# Patient Record
Sex: Female | Born: 1935 | Race: White | Hispanic: No | Marital: Married | Smoking: Former smoker
Health system: Southern US, Community
[De-identification: ages and names within clinical notes are randomized; demographics above are authoritative.]

## PROBLEM LIST (undated history)

## (undated) DIAGNOSIS — J438 Other emphysema: Secondary | ICD-10-CM

## (undated) DIAGNOSIS — T8859XA Other complications of anesthesia, initial encounter: Secondary | ICD-10-CM

## (undated) DIAGNOSIS — H547 Unspecified visual loss: Secondary | ICD-10-CM

## (undated) DIAGNOSIS — Z7901 Long term (current) use of anticoagulants: Secondary | ICD-10-CM

## (undated) DIAGNOSIS — E785 Hyperlipidemia, unspecified: Secondary | ICD-10-CM

## (undated) DIAGNOSIS — I4891 Unspecified atrial fibrillation: Secondary | ICD-10-CM

## (undated) DIAGNOSIS — E039 Hypothyroidism, unspecified: Secondary | ICD-10-CM

## (undated) DIAGNOSIS — F329 Major depressive disorder, single episode, unspecified: Secondary | ICD-10-CM

## (undated) DIAGNOSIS — I6529 Occlusion and stenosis of unspecified carotid artery: Secondary | ICD-10-CM

## (undated) DIAGNOSIS — Z860100 Personal history of colon polyps, unspecified: Secondary | ICD-10-CM

## (undated) DIAGNOSIS — I1310 Hypertensive heart and chronic kidney disease without heart failure, with stage 1 through stage 4 chronic kidney disease, or unspecified chronic kidney disease: Secondary | ICD-10-CM

## (undated) DIAGNOSIS — T4145XA Adverse effect of unspecified anesthetic, initial encounter: Secondary | ICD-10-CM

## (undated) DIAGNOSIS — M48061 Spinal stenosis, lumbar region without neurogenic claudication: Secondary | ICD-10-CM

## (undated) DIAGNOSIS — N3090 Cystitis, unspecified without hematuria: Secondary | ICD-10-CM

## (undated) DIAGNOSIS — K219 Gastro-esophageal reflux disease without esophagitis: Secondary | ICD-10-CM

## (undated) DIAGNOSIS — C4442 Squamous cell carcinoma of skin of scalp and neck: Secondary | ICD-10-CM

## (undated) DIAGNOSIS — I350 Nonrheumatic aortic (valve) stenosis: Secondary | ICD-10-CM

## (undated) DIAGNOSIS — S22080A Wedge compression fracture of T11-T12 vertebra, initial encounter for closed fracture: Secondary | ICD-10-CM

## (undated) DIAGNOSIS — T148XXA Other injury of unspecified body region, initial encounter: Secondary | ICD-10-CM

## (undated) DIAGNOSIS — I10 Essential (primary) hypertension: Secondary | ICD-10-CM

## (undated) DIAGNOSIS — I639 Cerebral infarction, unspecified: Secondary | ICD-10-CM

## (undated) DIAGNOSIS — I5031 Acute diastolic (congestive) heart failure: Secondary | ICD-10-CM

## (undated) HISTORY — DX: Squamous cell carcinoma of skin of scalp and neck: C44.42

## (undated) HISTORY — DX: Cerebral infarction, unspecified: I63.9

## (undated) HISTORY — DX: Personal history of colonic polyps: Z86.010

## (undated) HISTORY — DX: Spinal stenosis, lumbar region without neurogenic claudication: M48.061

## (undated) HISTORY — PX: TONSILLECTOMY AND ADENOIDECTOMY: SUR1326

## (undated) HISTORY — DX: Hypertensive heart and chronic kidney disease without heart failure, with stage 1 through stage 4 chronic kidney disease, or unspecified chronic kidney disease: I13.10

## (undated) HISTORY — DX: Essential (primary) hypertension: I10

## (undated) HISTORY — DX: Wedge compression fracture of t11-T12 vertebra, initial encounter for closed fracture: S22.080A

## (undated) HISTORY — DX: Major depressive disorder, single episode, unspecified: F32.9

## (undated) HISTORY — DX: Nonrheumatic aortic (valve) stenosis: I35.0

## (undated) HISTORY — DX: Other emphysema: J43.8

## (undated) HISTORY — DX: Other injury of unspecified body region, initial encounter: T14.8XXA

## (undated) HISTORY — DX: Acute diastolic (congestive) heart failure: I50.31

## (undated) HISTORY — DX: Personal history of colon polyps, unspecified: Z86.0100

## (undated) HISTORY — DX: Occlusion and stenosis of unspecified carotid artery: I65.29

## (undated) HISTORY — DX: Long term (current) use of anticoagulants: Z79.01

## (undated) HISTORY — DX: Unspecified atrial fibrillation: I48.91

## (undated) HISTORY — PX: APPENDECTOMY: SHX54

## (undated) HISTORY — DX: Gastro-esophageal reflux disease without esophagitis: K21.9

## (undated) HISTORY — DX: Unspecified visual loss: H54.7

## (undated) HISTORY — PX: CATARACT EXTRACTION: SUR2

## (undated) HISTORY — DX: Hyperlipidemia, unspecified: E78.5

---

## 1995-12-09 ENCOUNTER — Encounter: Payer: Self-pay | Admitting: Gastroenterology

## 1998-04-12 ENCOUNTER — Other Ambulatory Visit: Admission: RE | Admit: 1998-04-12 | Discharge: 1998-04-12 | Payer: Self-pay | Admitting: *Deleted

## 1999-05-04 ENCOUNTER — Other Ambulatory Visit: Admission: RE | Admit: 1999-05-04 | Discharge: 1999-05-04 | Payer: Self-pay | Admitting: *Deleted

## 2000-09-18 ENCOUNTER — Other Ambulatory Visit: Admission: RE | Admit: 2000-09-18 | Discharge: 2000-09-18 | Payer: Self-pay | Admitting: *Deleted

## 2000-12-23 HISTORY — PX: OTHER SURGICAL HISTORY: SHX169

## 2001-04-21 ENCOUNTER — Ambulatory Visit (HOSPITAL_COMMUNITY): Admission: RE | Admit: 2001-04-21 | Discharge: 2001-04-21 | Payer: Self-pay | Admitting: Internal Medicine

## 2001-04-21 ENCOUNTER — Encounter: Payer: Self-pay | Admitting: Internal Medicine

## 2001-04-23 ENCOUNTER — Ambulatory Visit (HOSPITAL_COMMUNITY): Admission: RE | Admit: 2001-04-23 | Discharge: 2001-04-23 | Payer: Self-pay | Admitting: Internal Medicine

## 2001-04-23 ENCOUNTER — Encounter: Payer: Self-pay | Admitting: Internal Medicine

## 2001-05-12 ENCOUNTER — Ambulatory Visit (HOSPITAL_COMMUNITY): Admission: RE | Admit: 2001-05-12 | Discharge: 2001-05-12 | Payer: Self-pay

## 2001-05-12 ENCOUNTER — Encounter (INDEPENDENT_AMBULATORY_CARE_PROVIDER_SITE_OTHER): Payer: Self-pay | Admitting: *Deleted

## 2001-09-23 ENCOUNTER — Other Ambulatory Visit: Admission: RE | Admit: 2001-09-23 | Discharge: 2001-09-23 | Payer: Self-pay | Admitting: *Deleted

## 2002-03-04 ENCOUNTER — Ambulatory Visit (HOSPITAL_COMMUNITY): Admission: RE | Admit: 2002-03-04 | Discharge: 2002-03-04 | Payer: Self-pay | Admitting: Specialist

## 2002-03-22 ENCOUNTER — Ambulatory Visit (HOSPITAL_COMMUNITY): Admission: RE | Admit: 2002-03-22 | Discharge: 2002-03-22 | Payer: Self-pay | Admitting: Specialist

## 2002-04-21 ENCOUNTER — Ambulatory Visit (HOSPITAL_COMMUNITY): Admission: RE | Admit: 2002-04-21 | Discharge: 2002-04-21 | Payer: Self-pay | Admitting: Specialist

## 2002-09-28 ENCOUNTER — Other Ambulatory Visit: Admission: RE | Admit: 2002-09-28 | Discharge: 2002-09-28 | Payer: Self-pay | Admitting: Obstetrics and Gynecology

## 2003-09-21 ENCOUNTER — Encounter: Payer: Self-pay | Admitting: Internal Medicine

## 2003-09-21 ENCOUNTER — Encounter: Admission: RE | Admit: 2003-09-21 | Discharge: 2003-09-21 | Payer: Self-pay | Admitting: Internal Medicine

## 2003-12-24 HISTORY — PX: COLONOSCOPY W/ POLYPECTOMY: SHX1380

## 2004-10-22 ENCOUNTER — Encounter: Payer: Self-pay | Admitting: Gastroenterology

## 2004-10-22 LAB — HM COLONOSCOPY

## 2005-05-23 ENCOUNTER — Ambulatory Visit: Payer: Self-pay | Admitting: Internal Medicine

## 2005-05-28 ENCOUNTER — Ambulatory Visit: Payer: Self-pay | Admitting: Internal Medicine

## 2005-07-15 ENCOUNTER — Ambulatory Visit: Payer: Self-pay | Admitting: Internal Medicine

## 2005-09-05 ENCOUNTER — Ambulatory Visit: Payer: Self-pay | Admitting: Internal Medicine

## 2005-09-10 ENCOUNTER — Ambulatory Visit: Payer: Self-pay | Admitting: Internal Medicine

## 2005-10-01 ENCOUNTER — Ambulatory Visit: Payer: Self-pay | Admitting: Internal Medicine

## 2005-10-02 ENCOUNTER — Ambulatory Visit: Payer: Self-pay | Admitting: Internal Medicine

## 2005-10-28 ENCOUNTER — Ambulatory Visit: Payer: Self-pay | Admitting: Internal Medicine

## 2005-10-30 ENCOUNTER — Ambulatory Visit: Payer: Self-pay | Admitting: Internal Medicine

## 2006-01-02 ENCOUNTER — Ambulatory Visit (HOSPITAL_COMMUNITY): Admission: RE | Admit: 2006-01-02 | Discharge: 2006-01-02 | Payer: Self-pay | Admitting: Chiropractic Medicine

## 2006-03-12 ENCOUNTER — Encounter: Admission: RE | Admit: 2006-03-12 | Discharge: 2006-03-12 | Payer: Self-pay | Admitting: Orthopaedic Surgery

## 2006-05-26 ENCOUNTER — Ambulatory Visit: Payer: Self-pay | Admitting: Internal Medicine

## 2006-05-27 ENCOUNTER — Ambulatory Visit: Payer: Self-pay | Admitting: Internal Medicine

## 2006-06-09 ENCOUNTER — Ambulatory Visit: Payer: Self-pay | Admitting: Internal Medicine

## 2006-06-17 ENCOUNTER — Ambulatory Visit: Payer: Self-pay | Admitting: Internal Medicine

## 2006-09-12 ENCOUNTER — Ambulatory Visit: Payer: Self-pay | Admitting: Internal Medicine

## 2006-09-17 ENCOUNTER — Encounter: Admission: RE | Admit: 2006-09-17 | Discharge: 2006-09-17 | Payer: Self-pay | Admitting: Internal Medicine

## 2006-09-30 ENCOUNTER — Ambulatory Visit: Payer: Self-pay | Admitting: Cardiology

## 2006-10-09 ENCOUNTER — Ambulatory Visit: Payer: Self-pay

## 2006-10-09 ENCOUNTER — Encounter: Payer: Self-pay | Admitting: Cardiology

## 2006-11-04 ENCOUNTER — Emergency Department (HOSPITAL_COMMUNITY): Admission: EM | Admit: 2006-11-04 | Discharge: 2006-11-04 | Payer: Self-pay | Admitting: Emergency Medicine

## 2006-11-06 ENCOUNTER — Ambulatory Visit: Payer: Self-pay | Admitting: Internal Medicine

## 2006-11-25 ENCOUNTER — Ambulatory Visit: Payer: Self-pay | Admitting: Internal Medicine

## 2006-12-23 HISTORY — PX: TOTAL HIP ARTHROPLASTY: SHX124

## 2007-01-06 ENCOUNTER — Ambulatory Visit: Payer: Self-pay | Admitting: Cardiovascular Disease

## 2007-01-06 ENCOUNTER — Inpatient Hospital Stay (HOSPITAL_COMMUNITY): Admission: RE | Admit: 2007-01-06 | Discharge: 2007-01-15 | Payer: Self-pay | Admitting: Orthopaedic Surgery

## 2007-01-08 ENCOUNTER — Encounter: Payer: Self-pay | Admitting: Cardiology

## 2007-01-20 ENCOUNTER — Ambulatory Visit: Payer: Self-pay | Admitting: Cardiology

## 2007-01-27 ENCOUNTER — Ambulatory Visit: Payer: Self-pay

## 2007-01-28 ENCOUNTER — Ambulatory Visit: Payer: Self-pay | Admitting: Internal Medicine

## 2007-02-17 ENCOUNTER — Ambulatory Visit: Payer: Self-pay | Admitting: Cardiology

## 2007-04-10 DIAGNOSIS — Z860100 Personal history of colon polyps, unspecified: Secondary | ICD-10-CM | POA: Insufficient documentation

## 2007-04-10 DIAGNOSIS — E785 Hyperlipidemia, unspecified: Secondary | ICD-10-CM | POA: Insufficient documentation

## 2007-04-10 DIAGNOSIS — E041 Nontoxic single thyroid nodule: Secondary | ICD-10-CM | POA: Insufficient documentation

## 2007-05-29 ENCOUNTER — Ambulatory Visit: Payer: Self-pay | Admitting: Cardiology

## 2007-06-02 ENCOUNTER — Ambulatory Visit: Payer: Self-pay | Admitting: Internal Medicine

## 2007-06-02 DIAGNOSIS — K219 Gastro-esophageal reflux disease without esophagitis: Secondary | ICD-10-CM

## 2007-06-02 HISTORY — DX: Gastro-esophageal reflux disease without esophagitis: K21.9

## 2007-06-04 ENCOUNTER — Encounter: Payer: Self-pay | Admitting: Internal Medicine

## 2007-06-04 LAB — CONVERTED CEMR LAB
ALT: 25 units/L (ref 0–40)
AST: 28 units/L (ref 0–37)
Creatinine, Ser: 0.9 mg/dL (ref 0.4–1.2)
Glucose, Bld: 105 mg/dL — ABNORMAL HIGH (ref 70–99)
VLDL: 19 mg/dL (ref 0–40)

## 2007-06-11 ENCOUNTER — Telehealth: Payer: Self-pay | Admitting: Internal Medicine

## 2007-07-06 ENCOUNTER — Encounter: Payer: Self-pay | Admitting: Internal Medicine

## 2007-07-07 ENCOUNTER — Ambulatory Visit: Payer: Self-pay | Admitting: Gastroenterology

## 2007-07-23 ENCOUNTER — Telehealth (INDEPENDENT_AMBULATORY_CARE_PROVIDER_SITE_OTHER): Payer: Self-pay | Admitting: *Deleted

## 2007-07-24 ENCOUNTER — Ambulatory Visit: Payer: Self-pay | Admitting: Internal Medicine

## 2007-07-24 DIAGNOSIS — R42 Dizziness and giddiness: Secondary | ICD-10-CM

## 2007-07-30 ENCOUNTER — Ambulatory Visit: Payer: Self-pay | Admitting: Internal Medicine

## 2007-08-05 ENCOUNTER — Encounter (INDEPENDENT_AMBULATORY_CARE_PROVIDER_SITE_OTHER): Payer: Self-pay | Admitting: *Deleted

## 2007-08-06 ENCOUNTER — Ambulatory Visit: Payer: Self-pay | Admitting: Internal Medicine

## 2007-08-06 DIAGNOSIS — E039 Hypothyroidism, unspecified: Secondary | ICD-10-CM

## 2007-08-17 ENCOUNTER — Encounter: Payer: Self-pay | Admitting: Internal Medicine

## 2007-08-31 ENCOUNTER — Ambulatory Visit: Payer: Self-pay | Admitting: Internal Medicine

## 2007-09-01 ENCOUNTER — Encounter: Payer: Self-pay | Admitting: Internal Medicine

## 2007-09-01 LAB — CONVERTED CEMR LAB
C3 Complement: 91 mg/dL (ref 88–201)
Compl, Total (CH50): 60 (ref 31–66)

## 2007-09-02 LAB — CONVERTED CEMR LAB
BUN: 9 mg/dL (ref 6–23)
Calcium: 9.5 mg/dL (ref 8.4–10.5)
Chloride: 105 meq/L (ref 96–112)
GFR calc non Af Amer: 64 mL/min
Hgb A1c MFr Bld: 6.1 % — ABNORMAL HIGH (ref 4.6–6.0)
Homocysteine: 8.9 micromoles/L (ref 5.00–13.90)
LDL Cholesterol: 91 mg/dL (ref 0–99)
Sodium: 144 meq/L (ref 135–145)
VLDL: 15 mg/dL (ref 0–40)

## 2007-10-06 ENCOUNTER — Ambulatory Visit: Payer: Self-pay | Admitting: Internal Medicine

## 2007-10-09 ENCOUNTER — Encounter (INDEPENDENT_AMBULATORY_CARE_PROVIDER_SITE_OTHER): Payer: Self-pay | Admitting: *Deleted

## 2007-10-13 ENCOUNTER — Ambulatory Visit: Payer: Self-pay | Admitting: Internal Medicine

## 2007-10-15 ENCOUNTER — Encounter: Payer: Self-pay | Admitting: Internal Medicine

## 2007-10-22 ENCOUNTER — Encounter: Payer: Self-pay | Admitting: Internal Medicine

## 2007-10-26 ENCOUNTER — Encounter: Payer: Self-pay | Admitting: Internal Medicine

## 2007-10-26 ENCOUNTER — Ambulatory Visit: Payer: Self-pay | Admitting: Family Medicine

## 2007-11-06 ENCOUNTER — Encounter (INDEPENDENT_AMBULATORY_CARE_PROVIDER_SITE_OTHER): Payer: Self-pay | Admitting: *Deleted

## 2007-12-01 ENCOUNTER — Ambulatory Visit: Payer: Self-pay | Admitting: Internal Medicine

## 2007-12-01 DIAGNOSIS — M810 Age-related osteoporosis without current pathological fracture: Secondary | ICD-10-CM | POA: Insufficient documentation

## 2007-12-01 DIAGNOSIS — I10 Essential (primary) hypertension: Secondary | ICD-10-CM

## 2007-12-01 HISTORY — DX: Essential (primary) hypertension: I10

## 2007-12-24 DIAGNOSIS — I639 Cerebral infarction, unspecified: Secondary | ICD-10-CM

## 2007-12-24 HISTORY — DX: Cerebral infarction, unspecified: I63.9

## 2008-01-07 ENCOUNTER — Encounter: Payer: Self-pay | Admitting: Internal Medicine

## 2008-01-07 ENCOUNTER — Inpatient Hospital Stay (HOSPITAL_COMMUNITY): Admission: EM | Admit: 2008-01-07 | Discharge: 2008-01-14 | Payer: Self-pay | Admitting: Emergency Medicine

## 2008-01-07 ENCOUNTER — Ambulatory Visit: Payer: Self-pay | Admitting: Cardiology

## 2008-01-07 ENCOUNTER — Ambulatory Visit: Payer: Self-pay | Admitting: Emergency Medicine

## 2008-01-08 ENCOUNTER — Encounter (INDEPENDENT_AMBULATORY_CARE_PROVIDER_SITE_OTHER): Payer: Self-pay | Admitting: Interventional Radiology

## 2008-01-11 ENCOUNTER — Encounter (INDEPENDENT_AMBULATORY_CARE_PROVIDER_SITE_OTHER): Payer: Self-pay | Admitting: Interventional Radiology

## 2008-01-11 ENCOUNTER — Ambulatory Visit: Payer: Self-pay | Admitting: Physical Medicine & Rehabilitation

## 2008-01-14 ENCOUNTER — Ambulatory Visit: Payer: Self-pay | Admitting: Cardiology

## 2008-01-14 ENCOUNTER — Encounter: Payer: Self-pay | Admitting: Internal Medicine

## 2008-01-14 ENCOUNTER — Inpatient Hospital Stay (HOSPITAL_COMMUNITY)
Admission: RE | Admit: 2008-01-14 | Discharge: 2008-01-21 | Payer: Self-pay | Admitting: Physical Medicine & Rehabilitation

## 2008-01-25 ENCOUNTER — Encounter
Admission: RE | Admit: 2008-01-25 | Discharge: 2008-04-14 | Payer: Self-pay | Admitting: Physical Medicine & Rehabilitation

## 2008-01-25 ENCOUNTER — Ambulatory Visit: Payer: Self-pay | Admitting: Internal Medicine

## 2008-01-25 LAB — CONVERTED CEMR LAB: Vit D, 1,25-Dihydroxy: 28 — ABNORMAL LOW (ref 30–89)

## 2008-02-01 ENCOUNTER — Ambulatory Visit: Payer: Self-pay | Admitting: Internal Medicine

## 2008-02-01 DIAGNOSIS — I6350 Cerebral infarction due to unspecified occlusion or stenosis of unspecified cerebral artery: Secondary | ICD-10-CM | POA: Insufficient documentation

## 2008-02-01 DIAGNOSIS — E559 Vitamin D deficiency, unspecified: Secondary | ICD-10-CM | POA: Insufficient documentation

## 2008-02-01 DIAGNOSIS — I4891 Unspecified atrial fibrillation: Secondary | ICD-10-CM | POA: Insufficient documentation

## 2008-02-02 ENCOUNTER — Ambulatory Visit: Payer: Self-pay | Admitting: Cardiology

## 2008-02-08 ENCOUNTER — Ambulatory Visit: Payer: Self-pay | Admitting: Cardiovascular Disease

## 2008-02-18 ENCOUNTER — Encounter
Admission: RE | Admit: 2008-02-18 | Discharge: 2008-02-19 | Payer: Self-pay | Admitting: Physical Medicine & Rehabilitation

## 2008-02-18 ENCOUNTER — Ambulatory Visit: Payer: Self-pay | Admitting: Physical Medicine & Rehabilitation

## 2008-02-18 ENCOUNTER — Ambulatory Visit: Payer: Self-pay | Admitting: Cardiology

## 2008-03-01 ENCOUNTER — Ambulatory Visit (HOSPITAL_COMMUNITY)
Admission: RE | Admit: 2008-03-01 | Discharge: 2008-03-01 | Payer: Self-pay | Admitting: Physical Medicine & Rehabilitation

## 2008-03-01 ENCOUNTER — Encounter: Payer: Self-pay | Admitting: Internal Medicine

## 2008-03-17 ENCOUNTER — Ambulatory Visit: Payer: Self-pay | Admitting: Internal Medicine

## 2008-03-31 ENCOUNTER — Telehealth (INDEPENDENT_AMBULATORY_CARE_PROVIDER_SITE_OTHER): Payer: Self-pay | Admitting: *Deleted

## 2008-04-04 ENCOUNTER — Ambulatory Visit: Payer: Self-pay | Admitting: Cardiology

## 2008-04-08 DIAGNOSIS — K469 Unspecified abdominal hernia without obstruction or gangrene: Secondary | ICD-10-CM | POA: Insufficient documentation

## 2008-04-08 DIAGNOSIS — J438 Other emphysema: Secondary | ICD-10-CM

## 2008-04-08 DIAGNOSIS — M129 Arthropathy, unspecified: Secondary | ICD-10-CM | POA: Insufficient documentation

## 2008-04-08 HISTORY — DX: Other emphysema: J43.8

## 2008-04-15 ENCOUNTER — Ambulatory Visit: Payer: Self-pay | Admitting: Internal Medicine

## 2008-04-15 ENCOUNTER — Ambulatory Visit: Payer: Self-pay | Admitting: Cardiology

## 2008-05-05 ENCOUNTER — Ambulatory Visit: Payer: Self-pay | Admitting: Internal Medicine

## 2008-05-12 ENCOUNTER — Encounter
Admission: RE | Admit: 2008-05-12 | Discharge: 2008-05-17 | Payer: Self-pay | Admitting: Physical Medicine & Rehabilitation

## 2008-05-17 ENCOUNTER — Ambulatory Visit: Payer: Self-pay | Admitting: Physical Medicine & Rehabilitation

## 2008-06-03 ENCOUNTER — Ambulatory Visit: Payer: Self-pay | Admitting: Cardiology

## 2008-06-13 ENCOUNTER — Ambulatory Visit: Payer: Self-pay | Admitting: Internal Medicine

## 2008-06-27 ENCOUNTER — Ambulatory Visit: Payer: Self-pay | Admitting: Internal Medicine

## 2008-07-06 ENCOUNTER — Telehealth (INDEPENDENT_AMBULATORY_CARE_PROVIDER_SITE_OTHER): Payer: Self-pay | Admitting: *Deleted

## 2008-07-14 ENCOUNTER — Ambulatory Visit: Payer: Self-pay | Admitting: Internal Medicine

## 2008-07-21 ENCOUNTER — Ambulatory Visit: Payer: Self-pay | Admitting: Cardiology

## 2008-07-22 ENCOUNTER — Encounter: Payer: Self-pay | Admitting: Internal Medicine

## 2008-07-28 ENCOUNTER — Ambulatory Visit: Payer: Self-pay | Admitting: Internal Medicine

## 2008-07-28 DIAGNOSIS — R5381 Other malaise: Secondary | ICD-10-CM

## 2008-07-28 DIAGNOSIS — R5383 Other fatigue: Secondary | ICD-10-CM

## 2008-07-28 DIAGNOSIS — Z8679 Personal history of other diseases of the circulatory system: Secondary | ICD-10-CM | POA: Insufficient documentation

## 2008-08-02 ENCOUNTER — Telehealth (INDEPENDENT_AMBULATORY_CARE_PROVIDER_SITE_OTHER): Payer: Self-pay | Admitting: *Deleted

## 2008-08-02 ENCOUNTER — Ambulatory Visit: Payer: Self-pay | Admitting: Internal Medicine

## 2008-08-03 ENCOUNTER — Encounter (INDEPENDENT_AMBULATORY_CARE_PROVIDER_SITE_OTHER): Payer: Self-pay | Admitting: *Deleted

## 2008-08-03 ENCOUNTER — Ambulatory Visit: Payer: Self-pay | Admitting: Internal Medicine

## 2008-08-03 LAB — CONVERTED CEMR LAB: OCCULT 3: NEGATIVE

## 2008-08-04 ENCOUNTER — Ambulatory Visit: Payer: Self-pay | Admitting: Internal Medicine

## 2008-08-07 LAB — CONVERTED CEMR LAB
AST: 28 units/L (ref 0–37)
Alkaline Phosphatase: 37 units/L — ABNORMAL LOW (ref 39–117)
BUN: 11 mg/dL (ref 6–23)
Bilirubin, Direct: 0.1 mg/dL (ref 0.0–0.3)
Chloride: 103 meq/L (ref 96–112)
Cholesterol: 157 mg/dL (ref 0–200)
GFR calc non Af Amer: 74 mL/min
LDL Cholesterol: 80 mg/dL (ref 0–99)
Potassium: 4.3 meq/L (ref 3.5–5.1)
Total Bilirubin: 1.2 mg/dL (ref 0.3–1.2)

## 2008-08-08 ENCOUNTER — Encounter (INDEPENDENT_AMBULATORY_CARE_PROVIDER_SITE_OTHER): Payer: Self-pay | Admitting: *Deleted

## 2008-08-26 ENCOUNTER — Encounter: Payer: Self-pay | Admitting: Internal Medicine

## 2008-08-30 ENCOUNTER — Telehealth: Payer: Self-pay | Admitting: Internal Medicine

## 2008-09-01 ENCOUNTER — Encounter: Payer: Self-pay | Admitting: Internal Medicine

## 2008-09-02 ENCOUNTER — Ambulatory Visit: Payer: Self-pay | Admitting: Internal Medicine

## 2008-09-12 ENCOUNTER — Ambulatory Visit: Payer: Self-pay | Admitting: Cardiovascular Disease

## 2008-09-12 ENCOUNTER — Emergency Department (HOSPITAL_COMMUNITY): Admission: EM | Admit: 2008-09-12 | Discharge: 2008-09-12 | Payer: Self-pay | Admitting: Emergency Medicine

## 2008-09-12 LAB — CONVERTED CEMR LAB
INR: 5.6 (ref 0.8–1.0)
Prothrombin Time: 54.8 s (ref 10.9–13.3)

## 2008-09-14 ENCOUNTER — Ambulatory Visit: Payer: Self-pay | Admitting: Cardiology

## 2008-09-21 ENCOUNTER — Encounter: Payer: Self-pay | Admitting: Internal Medicine

## 2008-09-23 ENCOUNTER — Ambulatory Visit: Payer: Self-pay | Admitting: Cardiovascular Disease

## 2008-10-04 ENCOUNTER — Encounter: Payer: Self-pay | Admitting: Internal Medicine

## 2008-10-12 ENCOUNTER — Ambulatory Visit: Payer: Self-pay | Admitting: Cardiology

## 2008-10-14 ENCOUNTER — Telehealth (INDEPENDENT_AMBULATORY_CARE_PROVIDER_SITE_OTHER): Payer: Self-pay | Admitting: *Deleted

## 2008-10-20 ENCOUNTER — Ambulatory Visit: Payer: Self-pay | Admitting: Cardiology

## 2008-10-26 ENCOUNTER — Ambulatory Visit: Payer: Self-pay | Admitting: Cardiovascular Disease

## 2008-11-14 ENCOUNTER — Ambulatory Visit: Payer: Self-pay | Admitting: Internal Medicine

## 2008-11-21 ENCOUNTER — Encounter: Payer: Self-pay | Admitting: Internal Medicine

## 2008-11-22 ENCOUNTER — Ambulatory Visit: Payer: Self-pay | Admitting: Internal Medicine

## 2008-11-22 DIAGNOSIS — H9190 Unspecified hearing loss, unspecified ear: Secondary | ICD-10-CM | POA: Insufficient documentation

## 2008-12-05 ENCOUNTER — Ambulatory Visit: Payer: Self-pay | Admitting: Cardiology

## 2008-12-23 HISTORY — PX: TOTAL SHOULDER REPLACEMENT: SUR1217

## 2009-01-02 ENCOUNTER — Ambulatory Visit: Payer: Self-pay | Admitting: Cardiology

## 2009-01-16 ENCOUNTER — Ambulatory Visit: Payer: Self-pay | Admitting: Cardiology

## 2009-02-06 ENCOUNTER — Ambulatory Visit: Payer: Self-pay | Admitting: Internal Medicine

## 2009-03-06 ENCOUNTER — Ambulatory Visit: Payer: Self-pay | Admitting: Cardiology

## 2009-04-03 ENCOUNTER — Ambulatory Visit: Payer: Self-pay | Admitting: Internal Medicine

## 2009-05-02 ENCOUNTER — Ambulatory Visit: Payer: Self-pay | Admitting: Cardiology

## 2009-05-24 ENCOUNTER — Encounter: Payer: Self-pay | Admitting: *Deleted

## 2009-05-29 ENCOUNTER — Encounter (INDEPENDENT_AMBULATORY_CARE_PROVIDER_SITE_OTHER): Payer: Self-pay | Admitting: Cardiology

## 2009-05-29 ENCOUNTER — Ambulatory Visit: Payer: Self-pay | Admitting: Cardiovascular Disease

## 2009-05-29 LAB — CONVERTED CEMR LAB
POC INR: 3.5
Protime: 22.6

## 2009-06-19 ENCOUNTER — Ambulatory Visit: Payer: Self-pay | Admitting: Cardiology

## 2009-06-20 ENCOUNTER — Ambulatory Visit: Payer: Self-pay | Admitting: Internal Medicine

## 2009-06-22 ENCOUNTER — Telehealth (INDEPENDENT_AMBULATORY_CARE_PROVIDER_SITE_OTHER): Payer: Self-pay | Admitting: *Deleted

## 2009-06-22 ENCOUNTER — Ambulatory Visit (HOSPITAL_COMMUNITY): Admission: RE | Admit: 2009-06-22 | Discharge: 2009-06-22 | Payer: Self-pay | Admitting: Internal Medicine

## 2009-06-23 ENCOUNTER — Telehealth (INDEPENDENT_AMBULATORY_CARE_PROVIDER_SITE_OTHER): Payer: Self-pay | Admitting: *Deleted

## 2009-06-27 ENCOUNTER — Encounter (INDEPENDENT_AMBULATORY_CARE_PROVIDER_SITE_OTHER): Payer: Self-pay | Admitting: *Deleted

## 2009-06-27 LAB — CONVERTED CEMR LAB
BUN: 14 mg/dL (ref 6–23)
Basophils Absolute: 0.3 10*3/uL — ABNORMAL HIGH (ref 0.0–0.1)
Creatinine, Ser: 0.7 mg/dL (ref 0.4–1.2)
Digitoxin Lvl: 0.2 ng/mL — ABNORMAL LOW (ref 0.8–2.0)
HCT: 43.4 % (ref 36.0–46.0)
Lymphs Abs: 2.4 10*3/uL (ref 0.7–4.0)
MCV: 89.3 fL (ref 78.0–100.0)
Monocytes Absolute: 0.3 10*3/uL (ref 0.1–1.0)
Neutro Abs: 3.9 10*3/uL (ref 1.4–7.7)
Platelets: 248 10*3/uL (ref 150.0–400.0)
RDW: 12.6 % (ref 11.5–14.6)

## 2009-06-28 ENCOUNTER — Ambulatory Visit: Payer: Self-pay | Admitting: Internal Medicine

## 2009-06-28 ENCOUNTER — Encounter: Payer: Self-pay | Admitting: *Deleted

## 2009-06-28 LAB — CONVERTED CEMR LAB
OCCULT 1: NEGATIVE
OCCULT 2: NEGATIVE

## 2009-07-11 ENCOUNTER — Encounter: Admission: RE | Admit: 2009-07-11 | Discharge: 2009-10-09 | Payer: Self-pay | Admitting: Internal Medicine

## 2009-07-11 ENCOUNTER — Encounter: Payer: Self-pay | Admitting: Internal Medicine

## 2009-07-17 ENCOUNTER — Encounter: Payer: Self-pay | Admitting: Internal Medicine

## 2009-07-17 ENCOUNTER — Ambulatory Visit: Payer: Self-pay | Admitting: Cardiology

## 2009-07-17 LAB — CONVERTED CEMR LAB
POC INR: 2.4
Prothrombin Time: 19 s

## 2009-07-24 ENCOUNTER — Telehealth (INDEPENDENT_AMBULATORY_CARE_PROVIDER_SITE_OTHER): Payer: Self-pay | Admitting: *Deleted

## 2009-08-08 ENCOUNTER — Ambulatory Visit: Payer: Self-pay | Admitting: Cardiovascular Disease

## 2009-08-08 ENCOUNTER — Ambulatory Visit: Payer: Self-pay | Admitting: Cardiology

## 2009-08-08 DIAGNOSIS — I739 Peripheral vascular disease, unspecified: Secondary | ICD-10-CM

## 2009-08-08 DIAGNOSIS — I6529 Occlusion and stenosis of unspecified carotid artery: Secondary | ICD-10-CM

## 2009-08-08 HISTORY — DX: Occlusion and stenosis of unspecified carotid artery: I65.29

## 2009-08-09 ENCOUNTER — Telehealth: Payer: Self-pay | Admitting: Internal Medicine

## 2009-08-10 ENCOUNTER — Encounter (INDEPENDENT_AMBULATORY_CARE_PROVIDER_SITE_OTHER): Payer: Self-pay | Admitting: *Deleted

## 2009-08-14 ENCOUNTER — Ambulatory Visit: Payer: Self-pay | Admitting: Internal Medicine

## 2009-08-21 ENCOUNTER — Telehealth (INDEPENDENT_AMBULATORY_CARE_PROVIDER_SITE_OTHER): Payer: Self-pay | Admitting: *Deleted

## 2009-08-22 ENCOUNTER — Ambulatory Visit: Payer: Self-pay

## 2009-08-22 ENCOUNTER — Encounter: Payer: Self-pay | Admitting: Cardiovascular Disease

## 2009-08-22 ENCOUNTER — Telehealth (INDEPENDENT_AMBULATORY_CARE_PROVIDER_SITE_OTHER): Payer: Self-pay

## 2009-08-24 ENCOUNTER — Telehealth: Payer: Self-pay | Admitting: Cardiology

## 2009-08-31 ENCOUNTER — Encounter: Payer: Self-pay | Admitting: Gastroenterology

## 2009-09-01 ENCOUNTER — Telehealth: Payer: Self-pay | Admitting: Cardiology

## 2009-09-06 ENCOUNTER — Encounter: Payer: Self-pay | Admitting: Cardiology

## 2009-09-07 ENCOUNTER — Encounter: Payer: Self-pay | Admitting: Internal Medicine

## 2009-09-07 ENCOUNTER — Ambulatory Visit: Payer: Self-pay | Admitting: Cardiology

## 2009-09-07 ENCOUNTER — Telehealth (INDEPENDENT_AMBULATORY_CARE_PROVIDER_SITE_OTHER): Payer: Self-pay | Admitting: *Deleted

## 2009-09-27 ENCOUNTER — Telehealth (INDEPENDENT_AMBULATORY_CARE_PROVIDER_SITE_OTHER): Payer: Self-pay | Admitting: *Deleted

## 2009-09-27 ENCOUNTER — Ambulatory Visit: Payer: Self-pay | Admitting: Internal Medicine

## 2009-10-03 ENCOUNTER — Ambulatory Visit: Payer: Self-pay | Admitting: Cardiology

## 2009-10-03 ENCOUNTER — Inpatient Hospital Stay (HOSPITAL_COMMUNITY): Admission: RE | Admit: 2009-10-03 | Discharge: 2009-10-07 | Payer: Self-pay | Admitting: Orthopaedic Surgery

## 2009-10-05 ENCOUNTER — Encounter (INDEPENDENT_AMBULATORY_CARE_PROVIDER_SITE_OTHER): Payer: Self-pay | Admitting: Pharmacist

## 2009-10-06 ENCOUNTER — Telehealth: Payer: Self-pay | Admitting: Cardiology

## 2009-10-09 ENCOUNTER — Encounter: Payer: Self-pay | Admitting: Cardiovascular Disease

## 2009-10-09 ENCOUNTER — Encounter: Payer: Self-pay | Admitting: Cardiology

## 2009-10-09 LAB — CONVERTED CEMR LAB: POC INR: 2.3

## 2009-10-17 ENCOUNTER — Encounter: Payer: Self-pay | Admitting: Cardiovascular Disease

## 2009-10-17 ENCOUNTER — Encounter: Payer: Self-pay | Admitting: Internal Medicine

## 2009-10-17 LAB — CONVERTED CEMR LAB
POC INR: 2.1
Prothrombin Time: 20.7 s

## 2009-10-24 ENCOUNTER — Encounter: Payer: Self-pay | Admitting: Cardiology

## 2009-10-24 ENCOUNTER — Encounter: Payer: Self-pay | Admitting: Internal Medicine

## 2009-10-24 LAB — CONVERTED CEMR LAB: POC INR: 2

## 2009-11-14 ENCOUNTER — Ambulatory Visit: Payer: Self-pay | Admitting: Cardiovascular Disease

## 2009-11-14 ENCOUNTER — Telehealth: Payer: Self-pay | Admitting: Cardiology

## 2009-11-15 ENCOUNTER — Encounter (INDEPENDENT_AMBULATORY_CARE_PROVIDER_SITE_OTHER): Payer: Self-pay | Admitting: *Deleted

## 2009-11-22 ENCOUNTER — Telehealth: Payer: Self-pay | Admitting: Cardiology

## 2009-11-28 ENCOUNTER — Ambulatory Visit: Payer: Self-pay | Admitting: Cardiovascular Disease

## 2009-12-18 ENCOUNTER — Ambulatory Visit: Payer: Self-pay | Admitting: Internal Medicine

## 2009-12-18 ENCOUNTER — Encounter (INDEPENDENT_AMBULATORY_CARE_PROVIDER_SITE_OTHER): Payer: Self-pay | Admitting: Cardiology

## 2009-12-18 LAB — CONVERTED CEMR LAB: POC INR: 3.4

## 2010-01-01 ENCOUNTER — Ambulatory Visit: Payer: Self-pay | Admitting: Internal Medicine

## 2010-01-04 ENCOUNTER — Encounter: Payer: Self-pay | Admitting: Internal Medicine

## 2010-01-04 ENCOUNTER — Ambulatory Visit: Payer: Self-pay | Admitting: Internal Medicine

## 2010-01-22 ENCOUNTER — Ambulatory Visit: Payer: Self-pay | Admitting: Cardiology

## 2010-02-16 ENCOUNTER — Encounter: Payer: Self-pay | Admitting: Cardiology

## 2010-02-19 ENCOUNTER — Ambulatory Visit: Payer: Self-pay | Admitting: Cardiology

## 2010-02-19 LAB — CONVERTED CEMR LAB: POC INR: 2.4

## 2010-02-22 ENCOUNTER — Telehealth: Payer: Self-pay | Admitting: Cardiology

## 2010-03-07 ENCOUNTER — Ambulatory Visit: Payer: Self-pay | Admitting: Internal Medicine

## 2010-03-12 LAB — CONVERTED CEMR LAB
ALT: 22 units/L (ref 0–35)
Albumin: 4.1 g/dL (ref 3.5–5.2)
BUN: 14 mg/dL (ref 6–23)
Basophils Relative: 1.8 % (ref 0.0–3.0)
Bilirubin Urine: NEGATIVE
Bilirubin, Direct: 0.2 mg/dL (ref 0.0–0.3)
Chloride: 104 meq/L (ref 96–112)
Cholesterol: 141 mg/dL (ref 0–200)
Eosinophils Absolute: 0.1 10*3/uL (ref 0.0–0.7)
Eosinophils Relative: 1.8 % (ref 0.0–5.0)
GFR calc non Af Amer: 73.14 mL/min (ref >60)
Glucose, Bld: 87 mg/dL (ref 70–99)
HDL: 62.2 mg/dL (ref >39.00)
Hemoglobin: 14.2 g/dL (ref 12.0–15.0)
Ketones, ur: NEGATIVE mg/dL
LDL Cholesterol: 58 mg/dL (ref 0–99)
Lymphocytes Relative: 41 % (ref 12.0–46.0)
MCHC: 32.9 g/dL (ref 30.0–36.0)
Monocytes Relative: 9.5 % (ref 3.0–12.0)
Neutro Abs: 2.3 10*3/uL (ref 1.4–7.7)
Neutrophils Relative %: 45.9 % (ref 43.0–77.0)
Nitrite: NEGATIVE
Potassium: 4.2 meq/L (ref 3.5–5.1)
RBC: 4.87 M/uL (ref 3.87–5.11)
Sodium: 142 meq/L (ref 135–145)
Specific Gravity, Urine: 1.01 (ref 1.000–1.030)
Total Protein, Urine: NEGATIVE mg/dL
Total Protein: 6.7 g/dL (ref 6.0–8.3)
VLDL: 20.4 mg/dL (ref 0.0–40.0)
WBC: 5 10*3/uL (ref 4.5–10.5)
pH: 7 (ref 5.0–8.0)

## 2010-03-13 ENCOUNTER — Ambulatory Visit: Payer: Self-pay | Admitting: Internal Medicine

## 2010-03-14 ENCOUNTER — Encounter: Payer: Self-pay | Admitting: Internal Medicine

## 2010-03-16 ENCOUNTER — Encounter: Payer: Self-pay | Admitting: Internal Medicine

## 2010-03-19 ENCOUNTER — Ambulatory Visit: Payer: Self-pay | Admitting: Cardiovascular Disease

## 2010-03-19 LAB — CONVERTED CEMR LAB: POC INR: 2.3

## 2010-04-11 ENCOUNTER — Ambulatory Visit: Payer: Self-pay | Admitting: Cardiology

## 2010-04-11 LAB — CONVERTED CEMR LAB: POC INR: 2.5

## 2010-05-09 ENCOUNTER — Ambulatory Visit: Payer: Self-pay | Admitting: Cardiology

## 2010-06-06 ENCOUNTER — Ambulatory Visit: Payer: Self-pay | Admitting: Internal Medicine

## 2010-06-06 ENCOUNTER — Telehealth: Payer: Self-pay | Admitting: Cardiology

## 2010-06-06 LAB — CONVERTED CEMR LAB: POC INR: 2.4

## 2010-07-11 ENCOUNTER — Ambulatory Visit: Payer: Self-pay | Admitting: Cardiology

## 2010-07-11 LAB — CONVERTED CEMR LAB: POC INR: 2.2

## 2010-07-13 ENCOUNTER — Encounter: Payer: Self-pay | Admitting: Internal Medicine

## 2010-08-06 ENCOUNTER — Telehealth (INDEPENDENT_AMBULATORY_CARE_PROVIDER_SITE_OTHER): Payer: Self-pay | Admitting: *Deleted

## 2010-08-09 ENCOUNTER — Ambulatory Visit: Payer: Self-pay | Admitting: Cardiovascular Disease

## 2010-09-05 ENCOUNTER — Ambulatory Visit: Payer: Self-pay | Admitting: Internal Medicine

## 2010-09-06 ENCOUNTER — Ambulatory Visit: Payer: Self-pay | Admitting: Cardiology

## 2010-09-06 LAB — CONVERTED CEMR LAB: POC INR: 2.1

## 2010-09-07 LAB — CONVERTED CEMR LAB
Albumin: 4 g/dL (ref 3.5–5.2)
CO2: 31 meq/L (ref 19–32)
Chloride: 106 meq/L (ref 96–112)
HDL: 48.6 mg/dL (ref >39.00)
LDL Cholesterol: 78 mg/dL (ref 0–99)
Potassium: 5 meq/L (ref 3.5–5.1)
TSH: 2.63 microintl units/mL (ref 0.35–5.50)
Total Bilirubin: 0.9 mg/dL (ref 0.3–1.2)
Total CHOL/HDL Ratio: 3
Total CK: 66 units/L (ref 7–177)
Triglycerides: 124 mg/dL (ref 0.0–149.0)

## 2010-09-11 ENCOUNTER — Encounter: Payer: Self-pay | Admitting: Internal Medicine

## 2010-09-12 ENCOUNTER — Ambulatory Visit: Payer: Self-pay | Admitting: Internal Medicine

## 2010-09-12 DIAGNOSIS — R05 Cough: Secondary | ICD-10-CM | POA: Insufficient documentation

## 2010-09-13 ENCOUNTER — Ambulatory Visit: Payer: Self-pay | Admitting: Internal Medicine

## 2010-10-04 ENCOUNTER — Ambulatory Visit: Payer: Self-pay | Admitting: Internal Medicine

## 2010-10-04 LAB — CONVERTED CEMR LAB: POC INR: 4

## 2010-10-05 ENCOUNTER — Ambulatory Visit: Payer: Self-pay | Admitting: Internal Medicine

## 2010-10-05 DIAGNOSIS — R109 Unspecified abdominal pain: Secondary | ICD-10-CM

## 2010-10-05 LAB — CONVERTED CEMR LAB
Bilirubin Urine: NEGATIVE
Glucose, Urine, Semiquant: NEGATIVE
Ketones, urine, test strip: NEGATIVE
Specific Gravity, Urine: 1.015
pH: 7

## 2010-10-06 ENCOUNTER — Encounter: Payer: Self-pay | Admitting: Internal Medicine

## 2010-10-08 LAB — CONVERTED CEMR LAB
Eosinophils Relative: 0.3 % (ref 0.0–5.0)
Lymphocytes Relative: 20 % (ref 12.0–46.0)
MCV: 89.1 fL (ref 78.0–100.0)
Monocytes Absolute: 0.9 10*3/uL (ref 0.1–1.0)
Monocytes Relative: 10.2 % (ref 3.0–12.0)
Neutrophils Relative %: 68.7 % (ref 43.0–77.0)
Platelets: 236 10*3/uL (ref 150.0–400.0)
WBC: 8.8 10*3/uL (ref 4.5–10.5)

## 2010-10-09 ENCOUNTER — Telehealth (INDEPENDENT_AMBULATORY_CARE_PROVIDER_SITE_OTHER): Payer: Self-pay | Admitting: *Deleted

## 2010-10-15 ENCOUNTER — Ambulatory Visit: Payer: Self-pay | Admitting: Internal Medicine

## 2010-10-15 LAB — CONVERTED CEMR LAB: POC INR: 2.2

## 2010-11-12 ENCOUNTER — Ambulatory Visit: Payer: Self-pay | Admitting: Cardiovascular Disease

## 2010-11-12 LAB — CONVERTED CEMR LAB: POC INR: 2.5

## 2010-12-10 ENCOUNTER — Ambulatory Visit: Payer: Self-pay | Admitting: Internal Medicine

## 2010-12-10 LAB — CONVERTED CEMR LAB: POC INR: 2.7

## 2010-12-21 ENCOUNTER — Ambulatory Visit
Admission: RE | Admit: 2010-12-21 | Discharge: 2010-12-21 | Payer: Self-pay | Source: Home / Self Care | Attending: Internal Medicine | Admitting: Internal Medicine

## 2010-12-21 DIAGNOSIS — J069 Acute upper respiratory infection, unspecified: Secondary | ICD-10-CM | POA: Insufficient documentation

## 2011-01-07 ENCOUNTER — Ambulatory Visit: Admission: RE | Admit: 2011-01-07 | Discharge: 2011-01-07 | Payer: Self-pay | Source: Home / Self Care

## 2011-01-07 LAB — CONVERTED CEMR LAB: POC INR: 2.5

## 2011-01-20 LAB — CONVERTED CEMR LAB: Cholesterol, target level: 200 mg/dL

## 2011-01-24 NOTE — Letter (Signed)
Summary: Letter from Patient Regarding Cholesterol Meds  Letter from Patient Regarding Cholesterol Meds   Imported By: Lanelle Bal 03/24/2010 10:35:02  _____________________________________________________________________  External Attachment:    Type:   Image     Comment:   External Document

## 2011-01-24 NOTE — Progress Notes (Signed)
Summary: refill  Phone Note Refill Request Message from:  Patient on June 06, 2010 1:05 PM  Refills Requested: Medication #1:  COUMADIN 5 MG TABS Take as directed by coumadin clinic. send to [facility]  Initial call taken by: Judie Grieve,  June 06, 2010 1:05 PM Caller: Patient    Prescriptions: COUMADIN 5 MG TABS (WARFARIN SODIUM) Take as directed by coumadin clinic.  #90 x 0   Entered by:   Weston Brass PharmD   Authorized by:   Rollene Rotunda, MD, [facility]   Signed by:   Weston Brass PharmD on 06/06/2010   Method used:   Electronically to        [facility] [address] (retail)       [phone]       [place], [place]  [address]       Ph: [phone]       Fax: [phone]   RxID:   [id number]

## 2011-01-24 NOTE — Assessment & Plan Note (Signed)
Summary: sore throat//lch   Vital Signs:  Patient profile:   77 year old female Height:      127 inches Weight:      2 pounds Temp:     98.0 degrees F oral Pulse rate:   92 / minute Pulse rhythm:   regular BP sitting:   122 / 70  (left arm) Cuff size:   regular  Vitals Entered By: Army Fossa CMA (December 21, 2010 11:32 AM) CC: Pt here c/o drainage, sore throat, coughing.  Comments startedon Tuesday [facility] [address]  not fasting    History of Present Illness: symptoms started 3 days ago, her chief complaint is sore throat which is described as moderate to severe. She also has watery nasal discharge and some cough.      Current Medications (verified): 1)  Vytorin 10-40 Mg Tabs (Ezetimibe-Simvastatin) .Marland Kitchen.. 1 Daily 2)  Levothroid 50 Mcg  Tabs (Levothyroxine Sodium) .Marland Kitchen.. 1 By Mouth Qd 3)  Digoxin 0.125 Mg Tabs (Digoxin) .... One By Mouth Daily 4)  Diltiazem Hcl Cr 240 Mg Xr24h-Cap (Diltiazem Hcl) .... Take 1 Capsule By Mouth Once A Day 5)  Basa 81mg  .... 1 By Mouth Qd 6)  Biotin .Marland Kitchen.. 1 By Mouth Qd 7)  Cal1000-Mag-Zinc -400-15 Mg-Mg-Unit Tabs .... Take 1 Tablet By Mouth Two Times A Day 8)  Multivitamins  Tabs (Multiple Vitamin) .Marland Kitchen.. 1 By Mouth Once Daily 9)  Vit C 500mg  10)  Vitamin D 2000 Unit Tabs (Cholecalciferol) .... Once Daily 11)  Magnesium 400 Mg Qd .Marland Kitchen.. 1 By Mouth Once Daily 12)  Tylenol Pm Prn 13)  Cranberry Concentrate 500 Mg Caps (Cranberry) .... 2 Caps Daily. 14)  Coumadin 5 Mg Tabs (Warfarin Sodium) .... Take As Directed By Coumadin Clinic. 15)  Timolol Maleate 0.5 % Soln (Timolol Maleate) .Marland Kitchen.. 1 Gtt in L Eye Each Am 16)  Lumigan 0.03 % Soln (Bimatoprost) .Marland Kitchen.. 1 Gtt R Eye At Bedtime 17)  Ensure Plus  Liqd (Nutritional Supplements) .... 8 Oz Once Daily 18)  Lorazepam 0.5 Mg Tabs (Lorazepam) .... As Needed For Anxiety  Allergies (verified): 1)  ! Cipro 2)  ! Vicodin 3)  ! Prempro (Conj Estrog-Medroxyprogest Ace) 4)  ! Desmopressin Acetate  (Desmopressin Acetate) 5)  ! Levaquin (Levofloxacin)  Past History:  Past Medical History: Reviewed history from 10/05/2010 and no changes required. 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 Tonsillectomy & adenoidectomy Vocal cord polyps Thyroid needle biopsy Appendectomy Cataract extraction  bilat Colon polypectomy, diverticulosis 2005, Dr Jarold Motto  Social History: Reviewed history from 08/08/2009 and no changes required.  The patient is remarried.  No history of drug abuse.   Nonsmoker.  Occasional drinker.   Review of Systems General:  Denies chills and fever. Resp:  Denies shortness of breath and wheezing; rarely has some yellow sputum. GI:  Denies diarrhea, nausea, and vomiting.  Physical Exam  General:  alert and well-developed.   Head:  sinuses nontender to palpation Eyes:  left eyelid ptosis (no new) Nose:  congested Mouth:  no redness, uvula midline, pharynx walls symmetric, no trismus or drooling observed Lungs:  normal respiratory effort and no intercostal retractions.  decreased breath sounds but clear. No increased work of breathing Heart:  bradycardia, irregular rhythm  Impression & Recommendations:  Problem # 1:  URI (ICD-465.9)  URI with predominantly sore throat. Throat examination benign. see  instructions  Orders: Rapid Strep (11914)  Complete Medication List: 1)  Vytorin 10-40 Mg Tabs (Ezetimibe-simvastatin) .Marland Kitchen.. 1 daily 2)  Levothroid 50 Mcg Tabs (Levothyroxine sodium) .Marland Kitchen.. 1 by mouth qd 3)  Digoxin 0.125 Mg Tabs (Digoxin) .... One by mouth daily 4)  Diltiazem Hcl Cr 240 Mg Xr24h-cap (Diltiazem hcl) .... Take 1 capsule by mouth once a day 5)   Basa 81mg   .... 1 by mouth qd 6)  Biotin  .Marland Kitchen.. 1 by mouth qd 7)  Cal1000-mag-zinc -400-15 Mg-mg-unit Tabs  .... Take 1 tablet by mouth two times a day 8)  Multivitamins Tabs (Multiple vitamin) .Marland Kitchen.. 1 by mouth once daily 9)  Vit C 500mg   10)  Vitamin D 2000 Unit Tabs (Cholecalciferol) .... Once daily 11)  Magnesium 400 Mg Qd  .Marland Kitchen.. 1 by mouth once daily 12)  Tylenol Pm Prn  13)  Cranberry Concentrate 500 Mg Caps (Cranberry) .... 2 caps daily. 14)  Coumadin 5 Mg Tabs (Warfarin sodium) .... Take as directed by coumadin clinic. 15)  Timolol Maleate 0.5 % Soln (Timolol maleate) .Marland Kitchen.. 1 gtt in l eye each am 16)  Lumigan 0.03 % Soln (Bimatoprost) .Marland Kitchen.. 1 gtt r eye at bedtime 17)  Ensure Plus Liqd (Nutritional supplements) .... 8 oz once daily 18)  Lorazepam 0.5 Mg Tabs (Lorazepam) .... As needed for anxiety  Patient Instructions: 1)  rest, fluids , tylenol 2)  robitussin DM 3)  saline nasal spray twice a day 4)  call or go to a urgent care if: symptoms severe, you  feel short of breath or have high fever 5)  call if not improving in 33 to 4 days    Orders Added: 1)  Rapid Strep [78295] 2)  Est. Patient Level III [62130]    Laboratory Results    Other Tests  Rapid Strep: negative Comments: Army Fossa CMA  December 21, 2010 11:34 AM

## 2011-01-24 NOTE — Medication Information (Signed)
Summary: rov/mw  Anticoagulant Therapy  Managed by: Bethena Midget, RN, BSN Referring MD: Rollene Rotunda MD PCP: Marga Melnick MD Supervising MD: Ladona Ridgel MD, Sharlot Gowda Indication 1: Atrial Fibrillation (ICD-427.31) Indication 2: stroke-embolic (ICD-436.0) Lab Used: [facility] [place] Site: [address] INR POC 4.0 INR RANGE 2 - 3  Dietary changes: yes       Details: Eating less leafy veggie  Health status changes: yes       Details: Hx of Diverticulitis, now having some lower Abd. pain, pt states she will call PCP.   Bleeding/hemorrhagic complications: no    Recent/future hospitalizations: no    Any changes in medication regimen? no    Recent/future dental: no  Any missed doses?: no       Is patient compliant with meds? yes       Allergies: 1)  ! Cipro 2)  ! Vicodin 3)  ! Prempro (Conj Estrog-Medroxyprogest Ace) 4)  ! Desmopressin Acetate (Desmopressin Acetate) 5)  ! Levaquin (Levofloxacin)  Anticoagulation Management History:      The patient is taking warfarin and comes in today for a routine follow up visit.  Positive risk factors for bleeding include an age of 75 years or older and history of CVA/TIA.  The bleeding index is 'intermediate risk'.  Positive CHADS2 values include History of HTN, Age > 78 years old, and Prior Stroke/CVA/TIA.  Negative CHADS2 values include History of Diabetes.  The start date was 01/21/2008.  Her last INR was 5.6 RATIO.  Anticoagulation responsible provider: Ladona Ridgel MD, Sharlot Gowda.  INR POC: 4.0.  Cuvette Lot#: [id number].  Exp: 10/2011.    Anticoagulation Management Assessment/Plan:      The patient's current anticoagulation dose is Coumadin 5 mg tabs: Take as directed by coumadin clinic..  The target INR is 2.0-3.0.  The next INR is due 10/15/2010.  Anticoagulation instructions were given to patient.  Results were reviewed/authorized by Bethena Midget, RN, BSN.  She was notified by Bethena Midget, RN, BSN.         Prior Anticoagulation  Instructions: INR 2.1 Continue taking 1 tablet everyday. No changes today. We'll see you in 4 weeks.  Current Anticoagulation Instructions: INR 4.0 Skip today's dose then resume 5mg s everyday.  Recheck in 10 days.

## 2011-01-24 NOTE — Medication Information (Signed)
Summary: rov/tm  Anticoagulant Therapy  Managed by: Weston Brass, PharmD Referring MD: Rollene Rotunda MD PCP: Marga Melnick MD Supervising MD: Excell Seltzer MD, Casimiro Needle Indication 1: Atrial Fibrillation (ICD-427.31) Indication 2: stroke-embolic (ICD-436.0) Lab Used: [facility] [place] Site: [address] INR POC 1.9 INR RANGE 2 - 3  Dietary changes: yes       Details: had vitamin k foods the past 2 days   Health status changes: no    Bleeding/hemorrhagic complications: no    Recent/future hospitalizations: no    Any changes in medication regimen? yes       Details: started taking zantac   Recent/future dental: no  Any missed doses?: no       Is patient compliant with meds? yes       Allergies: 1)  ! Cipro 2)  ! Vicodin 3)  ! Prempro (Conj Estrog-Medroxyprogest Ace) 4)  ! Desmopressin Acetate (Desmopressin Acetate) 5)  ! Levaquin (Levofloxacin)  Anticoagulation Management History:      The patient is taking warfarin and comes in today for a routine follow up visit.  Positive risk factors for bleeding include an age of 75 years or older and history of CVA/TIA.  The bleeding index is 'intermediate risk'.  Positive CHADS2 values include History of HTN, Age > 55 years old, and Prior Stroke/CVA/TIA.  Negative CHADS2 values include History of Diabetes.  The start date was 01/21/2008.  Her last INR was 5.6 RATIO.  Anticoagulation responsible provider: Excell Seltzer MD, Casimiro Needle.  INR POC: 1.9.  Cuvette Lot#: [id number].  Exp: 09/2011.    Anticoagulation Management Assessment/Plan:      The patient's current anticoagulation dose is Coumadin 5 mg tabs: Take as directed by coumadin clinic..  The target INR is 2.0-3.0.  The next INR is due 09/06/2010.  Anticoagulation instructions were given to patient.  Results were reviewed/authorized by Weston Brass, PharmD.  She was notified by Weston Brass PharmD.         Prior Anticoagulation Instructions: INR 2.2 Continue 5mg s everyday. Recheck in 4  weeks.   Current Anticoagulation Instructions: INR 1.9  Take 1 1/2 tablets today then resume same dose of 1 tablet every day.

## 2011-01-24 NOTE — Medication Information (Signed)
Summary: rov/eac  Anticoagulant Therapy  Managed by: Bethena Midget, RN, BSN Referring MD: Rollene Rotunda MD PCP: Marga Melnick MD Supervising MD: Gala Romney MD, Reuel Boom Indication 1: Atrial Fibrillation (ICD-427.31) Indication 2: stroke-embolic (ICD-436.0) Lab Used: [facility] [place] Site: [address] INR POC 2.4 INR RANGE 2 - 3  Dietary changes: no    Health status changes: no    Bleeding/hemorrhagic complications: no    Recent/future hospitalizations: no    Any changes in medication regimen? no    Recent/future dental: no  Any missed doses?: no       Is patient compliant with meds? yes       Allergies: 1)  ! Cipro 2)  ! Vicodin 3)  ! Prempro (Conj Estrog-Medroxyprogest Ace) 4)  ! Desmopressin Acetate (Desmopressin Acetate) 5)  ! Levaquin (Levofloxacin)  Anticoagulation Management History:      The patient is taking warfarin and comes in today for a routine follow up visit.  Positive risk factors for bleeding include an age of 75 years or older and history of CVA/TIA.  The bleeding index is 'intermediate risk'.  Positive CHADS2 values include History of HTN, Age > 75 years old, and Prior Stroke/CVA/TIA.  Negative CHADS2 values include History of Diabetes.  The start date was 01/21/2008.  Her last INR was 5.6 RATIO.  Anticoagulation responsible provider: Neela Zecca MD, Reuel Boom.  INR POC: 2.4.  Cuvette Lot#: [id number].  Exp: 07/2011.    Anticoagulation Management Assessment/Plan:      The patient's current anticoagulation dose is Coumadin 5 mg tabs: Take as directed by coumadin clinic..  The target INR is 2.0-3.0.  The next INR is due 07/11/2010.  Anticoagulation instructions were given to patient.  Results were reviewed/authorized by Bethena Midget, RN, BSN.  She was notified by Bethena Midget, RN, BSN.         Prior Anticoagulation Instructions: INR 2.4  Continue taking 1 tablet every day.  Return to clinic in 1 month.    Current Anticoagulation  Instructions: INR 2.4 Continue 5mg s everyday. Recheck in 4 weeks.

## 2011-01-24 NOTE — Medication Information (Signed)
Summary: rov/sp  Anticoagulant Therapy  Managed by: Weston Brass, PharmD Referring MD: Rollene Rotunda MD PCP: Marga Melnick MD Supervising MD: Shirlee Latch MD, Freida Busman Indication 1: Atrial Fibrillation (ICD-427.31) Indication 2: stroke-embolic (ICD-436.0) Lab Used: [facility] [place] Site: [address] INR POC 2.5 INR RANGE 2 - 3  Dietary changes: no    Health status changes: yes       Details: pt fell last night when the cushion slipped out of a chair; has large bruise on her right buttock.  Went to orthopedic office and the xrays were normal.  They said it was just a bad bruise.   Bleeding/hemorrhagic complications: no    Recent/future hospitalizations: no    Any changes in medication regimen? no    Recent/future dental: no  Any missed doses?: no       Is patient compliant with meds? yes       Allergies: 1)  ! Cipro 2)  ! Vicodin 3)  ! Prempro (Conj Estrog-Medroxyprogest Ace) 4)  ! Desmopressin Acetate (Desmopressin Acetate) 5)  ! Levaquin (Levofloxacin)  Anticoagulation Management History:      The patient is taking warfarin and comes in today for a routine follow up visit.  Positive risk factors for bleeding include an age of 75 years or older and history of CVA/TIA.  The bleeding index is 'intermediate risk'.  Positive CHADS2 values include History of HTN, Age > 18 years old, and Prior Stroke/CVA/TIA.  Negative CHADS2 values include History of Diabetes.  The start date was 01/21/2008.  Her last INR was 5.6 RATIO.  Anticoagulation responsible provider: Shirlee Latch MD, Zaven Klemens.  INR POC: 2.5.  Cuvette Lot#: [id number].  Exp: 04/2011.    Anticoagulation Management Assessment/Plan:      The patient's current anticoagulation dose is Coumadin 5 mg tabs: Take as directed by coumadin clinic..  The target INR is 2.0-3.0.  The next INR is due 05/09/2010.  Anticoagulation instructions were given to patient.  Results were reviewed/authorized by Weston Brass, PharmD.  She was notified  by Weston Brass PharmD.         Prior Anticoagulation Instructions: INR 2.3 Continue 5mg s everyday. Recheck in 4 weeks.   Current Anticoagulation Instructions: INR 2.5  Take 1/2 tablet today then resume same dose of 1 tablet daily

## 2011-01-24 NOTE — Assessment & Plan Note (Signed)
Summary: possible diverticulosis//lch   Vital Signs:  Patient profile:   77 year old female Weight:      131.2 pounds BMI:     20.32 Temp:     97.8 degrees F oral Pulse rate:   84 / minute Resp:     17 per minute BP sitting:   114 / 60  (left arm) Cuff size:   regular  Vitals Entered By: Shonna Chock CMA (October 05, 2010 8:45 AM) CC: Patient with lower abdominal pain , Abdominal pain   Primary Care Ari Bernabei:  Marga Melnick MD  CC:  Patient with lower abdominal pain  and Abdominal pain.  History of Present Illness: Abdominal Pain      This is an 77 year old woman who presents with Abdominal pain X 2-3 days.  The patient denies nausea, vomiting, diarrhea, constipation, melena, hematochezia, anorexia, and hematemesis.  The location of the pain is suprapubic.  The pain is described as intermittent "hurting" and radiating to the rectum.  The patient denies the following symptoms: fever, weight loss, dysuria, chest pain, jaundice, dark urine, and vaginal bleeding or discharge.  The pain is worse with standing and walking. It has no triggers or relievers.PMH of Tics & polyps.Diverticulitis in 2007; symptomas are ? similar.  Current Medications (verified): 1)  Vytorin 10-40 Mg Tabs (Ezetimibe-Simvastatin) .Marland Kitchen.. 1 Daily 2)  Levothroid 50 Mcg  Tabs (Levothyroxine Sodium) .Marland Kitchen.. 1 By Mouth Qd 3)  Digoxin 0.125 Mg Tabs (Digoxin) .... One By Mouth Daily 4)  Diltiazem Hcl Cr 240 Mg Xr24h-Cap (Diltiazem Hcl) .... Take 1 Capsule By Mouth Once A Day 5)  Basa 81mg  .... 1 By Mouth Qd 6)  Biotin .Marland Kitchen.. 1 By Mouth Qd 7)  Cal1000-Mag-Zinc -400-15 Mg-Mg-Unit Tabs .... Take 1 Tablet By Mouth Two Times A Day 8)  Multivitamins  Tabs (Multiple Vitamin) .Marland Kitchen.. 1 By Mouth Once Daily 9)  Vit C 500mg  10)  Vitamin D 2000 Unit Tabs (Cholecalciferol) .... Once Daily 11)  Magnesium 400 Mg Qd .Marland Kitchen.. 1 By Mouth Once Daily 12)  Tylenol Pm Prn 13)  Cranberry Concentrate 500 Mg Caps (Cranberry) .... 2 Caps Daily. 14)   Coumadin 5 Mg Tabs (Warfarin Sodium) .... Take As Directed By Coumadin Clinic. 15)  Timolol Maleate 0.5 % Soln (Timolol Maleate) .Marland Kitchen.. 1 Gtt in L Eye Each Am 16)  Lumigan 0.03 % Soln (Bimatoprost) .Marland Kitchen.. 1 Gtt R Eye At Bedtime 17)  Ensure Plus  Liqd (Nutritional Supplements) .... 8 Oz Once Daily 18)  Lorazepam 0.5 Mg Tabs (Lorazepam) .... As Needed For Anxiety  Allergies: 1)  ! Cipro 2)  ! Vicodin 3)  ! Prempro (Conj Estrog-Medroxyprogest Ace) 4)  ! Desmopressin Acetate (Desmopressin Acetate) 5)  ! 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  Physical Exam  General:  Thin but in no acute distress; alert,appropriate and cooperative throughout examination Eyes:  No corneal or conjunctival inflammation noted. No icterus; anisocoria Mouth:  Oral mucosa and oropharynx without lesions or exudates.  No erythema Lungs:  Normal respiratory effort, chest expands symmetrically. Lungs are clear to auscultation, no crackles or wheezes. Heart:  bradycardia, irregular rhythm, and  flow murmur.   Abdomen:  Bowel sounds positive,abdomen soft  but tender in suprapubic area without masses, organomegaly or hernias noted. Extremities:  trace left pedal edema and trace right pedal edema. Negative SLR  Skin:  Intact without suspicious lesions or rashes. No jaundice Cervical Nodes:  No lymphadenopathy noted Axillary Nodes:  No palpable lymphadenopathy Psych:  flat affect and subdued.     Impression & Recommendations:  Problem # 1:  ABDOMINAL PAIN, SUPRAPUBIC (ICD-789.09)  probable UTI  Orders: Venipuncture (16109) TLB-CBC Platelet - w/Differential (85025-CBCD) Prescription  Created Electronically [phone] [phone])  Complete Medication List: 1)  Vytorin 10-40 Mg Tabs (Ezetimibe-simvastatin) .Marland Kitchen.. 1 daily 2)  Levothroid 50 Mcg Tabs (Levothyroxine sodium) .Marland Kitchen.. 1 by mouth qd 3)  Digoxin 0.125 Mg Tabs (Digoxin) .... One by mouth daily 4)  Diltiazem Hcl Cr 240 Mg Xr24h-cap (Diltiazem hcl) .... Take 1 capsule by mouth once a day 5)  Basa 81mg   .... 1 by mouth qd 6)  Biotin  .Marland Kitchen.. 1 by mouth qd 7)  Cal1000-mag-zinc -400-15 Mg-mg-unit Tabs  .... Take 1 tablet by mouth two times a day 8)  Multivitamins Tabs (Multiple vitamin) .Marland Kitchen.. 1 by mouth once daily 9)  Vit C 500mg   10)  Vitamin D 2000 Unit Tabs (Cholecalciferol) .... Once daily 11)  Magnesium 400 Mg Qd  .Marland Kitchen.. 1 by mouth once daily 12)  Tylenol Pm Prn  13)  Cranberry Concentrate 500 Mg Caps (Cranberry) .... 2 caps daily. 14)  Coumadin 5 Mg Tabs (Warfarin sodium) .... Take as directed by coumadin clinic. 15)  Timolol Maleate 0.5 % Soln (Timolol maleate) .Marland Kitchen.. 1 gtt in l eye each am 16)  Lumigan 0.03 % Soln (Bimatoprost) .Marland Kitchen.. 1 gtt r eye at bedtime 17)  Ensure Plus Liqd (Nutritional supplements) .... 8 oz once daily 18)  Lorazepam 0.5 Mg Tabs (Lorazepam) .... As needed for anxiety 19)  Nitrofurantoin Monohyd Macro 100 Mg Caps (Nitrofurantoin monohyd macro) .Marland Kitchen.. 1 two times a day  Other Orders: UA Dipstick w/o Micro (manual) (09811) Specimen Handling (99000) T-Culture, Urine ([id number])  Patient Instructions: 1)  Drink as much fluid as you can tolerate for the next few days. Prescriptions: NITROFURANTOIN MONOHYD MACRO 100 MG CAPS (NITROFURANTOIN MONOHYD MACRO) 1 two times a day  #20 x 0   Entered and Authorized by:   Marga Melnick MD   Signed by:   Marga Melnick MD on 10/05/2010   Method used:   Faxed to ...       [facility]* (retail)       [address]       [place], [place]  [id number]       Ph: [phone]       Fax: [phone]   RxID:   [id number]     Laboratory Results   Urine  Tests    Routine Urinalysis   Color: yellow Appearance: Cloudy Glucose: negative   (Normal Range: Negative) Bilirubin: negative   (Normal Range: Negative) Ketone: negative   (Normal Range: Negative) Spec. Gravity: 1.015   (Normal Range: 1.003-1.035) Blood: moderate   (Normal Range: Negative) pH: 7.0   (Normal Range: 5.0-8.0) Protein: negative   (Normal Range: Negative) Urobilinogen: 0.2   (Normal Range: 0-1) Nitrite: negative   (Normal Range: Negative) Leukocyte Esterace: moderate   (Normal Range: Negative)    Comments: Sent for culture

## 2011-01-24 NOTE — Medication Information (Signed)
Summary: Interaction with Diltiazem & Vytorin/[facility]  Interaction with Diltiazem & Vytorin/[facility]   Imported By: Lanelle Bal 08/10/2010 12:34:36  _____________________________________________________________________  External Attachment:    Type:   Image     Comment:   External Document

## 2011-01-24 NOTE — Medication Information (Signed)
Summary: rov/tm  Anticoagulant Therapy  Managed by: Leota Sauers, PharmD, BCPS, CPP Referring MD: Rollene Rotunda MD PCP: Marga Melnick MD Supervising MD: Excell Seltzer MD, Casimiro Needle Indication 1: Atrial Fibrillation (ICD-427.31) Indication 2: stroke-embolic (ICD-436.0) Lab Used: [facility] [place] Site: [address] INR POC 2.5 INR RANGE 2 - 3  Dietary changes: no    Health status changes: no    Bleeding/hemorrhagic complications: no    Recent/future hospitalizations: no    Any changes in medication regimen? no    Recent/future dental: no  Any missed doses?: no       Is patient compliant with meds? yes       Current Medications (verified): 1)  Vytorin 10-40 Mg Tabs (Ezetimibe-Simvastatin) .Marland Kitchen.. 1 Daily 2)  Levothroid 50 Mcg  Tabs (Levothyroxine Sodium) .Marland Kitchen.. 1 By Mouth Qd 3)  Digoxin 0.125 Mg Tabs (Digoxin) .... One By Mouth Daily 4)  Diltiazem Hcl Cr 240 Mg Xr24h-Cap (Diltiazem Hcl) .... Take 1 Capsule By Mouth Once A Day 5)  Basa 81mg  .... 1 By Mouth Qd 6)  Biotin .Marland Kitchen.. 1 By Mouth Qd 7)  Cal1000-Mag-Zinc -400-15 Mg-Mg-Unit Tabs .... Take 1 Tablet By Mouth Two Times A Day 8)  Multivitamins  Tabs (Multiple Vitamin) .Marland Kitchen.. 1 By Mouth Once Daily 9)  Vit C 500mg  10)  Vitamin D 2000 Unit Tabs (Cholecalciferol) .... Once Daily 11)  Magnesium 400 Mg Qd .Marland Kitchen.. 1 By Mouth Once Daily 12)  Tylenol Pm Prn 13)  Cranberry Concentrate 500 Mg Caps (Cranberry) .... 2 Caps Daily. 14)  Coumadin 5 Mg Tabs (Warfarin Sodium) .... Take As Directed By Coumadin Clinic. 15)  Timolol Maleate 0.5 % Soln (Timolol Maleate) .Marland Kitchen.. 1 Gtt in L Eye Each Am 16)  Lumigan 0.03 % Soln (Bimatoprost) .Marland Kitchen.. 1 Gtt R Eye At Bedtime 17)  Ensure Plus  Liqd (Nutritional Supplements) .... 8 Oz Once Daily 18)  Lorazepam 0.5 Mg Tabs (Lorazepam) .... As Needed For Anxiety 19)  Nitrofurantoin Monohyd Macro 100 Mg Caps (Nitrofurantoin Monohyd Macro) .Marland Kitchen.. 1 Two Times A Day 20)  Nitrofurantoin Monohyd Macro 100 Mg Caps  (Nitrofurantoin Monohyd Macro) .Marland Kitchen.. 1 Two Times A Day  Allergies: 1)  ! Cipro 2)  ! Vicodin 3)  ! Prempro (Conj Estrog-Medroxyprogest Ace) 4)  ! Desmopressin Acetate (Desmopressin Acetate) 5)  ! Levaquin (Levofloxacin)  Anticoagulation Management History:      The patient is taking warfarin and comes in today for a routine follow up visit.  Positive risk factors for bleeding include an age of 21 years or older and history of CVA/TIA.  The bleeding index is 'intermediate risk'.  Positive CHADS2 values include History of HTN, Age > 52 years old, and Prior Stroke/CVA/TIA.  Negative CHADS2 values include History of Diabetes.  The start date was 01/21/2008.  Her last INR was 5.6 RATIO.  Anticoagulation responsible provider: Excell Seltzer MD, Casimiro Needle.  INR POC: 2.5.  Cuvette Lot#: [id number].  Exp: 11/2011.    Anticoagulation Management Assessment/Plan:      The patient's current anticoagulation dose is Coumadin 5 mg tabs: Take as directed by coumadin clinic..  The target INR is 2.0-3.0.  The next INR is due 12/10/2010.  Anticoagulation instructions were given to patient.  Results were reviewed/authorized by Leota Sauers, PharmD, BCPS, CPP.         Prior Anticoagulation Instructions: INR 2.2 Continue 5mg s everyday. Recheck in 4 weeks.   Current Anticoagulation Instructions: INR 2.5  Coumadin 5mg  tabs, take 1 tab each day

## 2011-01-24 NOTE — Medication Information (Signed)
Summary: cvrr rov    js  Anticoagulant Therapy  Managed by: Bethena Midget, RN, BSN Referring MD: Rollene Rotunda MD PCP: Marga Melnick MD Supervising MD: Tenny Craw MD, Gunnar Fusi Indication 1: Atrial Fibrillation (ICD-427.31) Indication 2: stroke-embolic (ICD-436.0) Lab Used: [facility] [place] Site: [address] INR POC 2.8 INR RANGE 2 - 3  Dietary changes: yes       Details: eating  more green leafy veggies  Health status changes: no    Bleeding/hemorrhagic complications: no    Recent/future hospitalizations: no    Any changes in medication regimen? no    Recent/future dental: no  Any missed doses?: no       Is patient compliant with meds? yes       Allergies: 1)  ! Cipro 2)  ! Vicodin 3)  ! Prempro (Conj Estrog-Medroxyprogest Ace) 4)  ! Desmopressin Acetate (Desmopressin Acetate) 5)  ! Levaquin (Levofloxacin)  Anticoagulation Management History:      The patient is taking warfarin and comes in today for a routine follow up visit.  Positive risk factors for bleeding include an age of 24 years or older and history of CVA/TIA.  The bleeding index is 'intermediate risk'.  Positive CHADS2 values include History of HTN, Age > 62 years old, and Prior Stroke/CVA/TIA.  Negative CHADS2 values include History of Diabetes.  The start date was 01/21/2008.  Her last INR was 5.6 RATIO.  Anticoagulation responsible provider: Tenny Craw MD, Gunnar Fusi.  INR POC: 2.8.  Cuvette Lot#: [id number].  Exp: 01/2011.    Anticoagulation Management Assessment/Plan:      The patient's current anticoagulation dose is Coumadin 5 mg tabs: Take as directed by coumadin clinic..  The target INR is 2.0-3.0.  The next INR is due 01/22/2010.  Anticoagulation instructions were given to patient.  Results were reviewed/authorized by Bethena Midget, RN, BSN.  She was notified by Bethena Midget, RN, BSN.         Prior Anticoagulation Instructions: INR=3.4   The patient is to hold one dose of coumadin.  The dosage to be  resumed includes: 1 tablet every day.  Current Anticoagulation Instructions: INR 2.8 Continue 5mg s daily. Recheck in 3 weeks.

## 2011-01-24 NOTE — Medication Information (Signed)
Summary: CCR  Anticoagulant Therapy  Managed by: Cloyde Reams, RN, BSN Referring MD: Rollene Rotunda MD PCP: Marga Melnick MD Supervising MD: Tenny Craw MD, Gunnar Fusi Indication 1: Atrial Fibrillation (ICD-427.31) Indication 2: stroke-embolic (ICD-436.0) Lab Used: [facility] [place] Site: [address] INR POC 2.7 INR RANGE 2 - 3  Dietary changes: no    Health status changes: yes       Details: Recent UTI  Bleeding/hemorrhagic complications: no    Recent/future hospitalizations: no    Any changes in medication regimen? no    Recent/future dental: no  Any missed doses?: no       Is patient compliant with meds? yes      Comments: Completed Nitrofurantoin for UTI.  Allergies: 1)  ! Cipro 2)  ! Vicodin 3)  ! Prempro (Conj Estrog-Medroxyprogest Ace) 4)  ! Desmopressin Acetate (Desmopressin Acetate) 5)  ! Levaquin (Levofloxacin)  Anticoagulation Management History:      The patient is taking warfarin and comes in today for a routine follow up visit.  Positive risk factors for bleeding include an age of 75 years or older and history of CVA/TIA.  The bleeding index is 'intermediate risk'.  Positive CHADS2 values include History of HTN, Age > 61 years old, and Prior Stroke/CVA/TIA.  Negative CHADS2 values include History of Diabetes.  The start date was 01/21/2008.  Her last INR was 5.6 RATIO.  Anticoagulation responsible provider: Tenny Craw MD, Gunnar Fusi.  INR POC: 2.7.  Cuvette Lot#: [id number].  Exp: 01/2012.    Anticoagulation Management Assessment/Plan:      The patient's current anticoagulation dose is Coumadin 5 mg tabs: Take as directed by coumadin clinic..  The target INR is 2.0-3.0.  The next INR is due 01/07/2011.  Anticoagulation instructions were given to patient.  Results were reviewed/authorized by Cloyde Reams, RN, BSN.  She was notified by Cloyde Reams RN.         Prior Anticoagulation Instructions: INR 2.5  Coumadin 5mg  tabs, take 1 tab each day  Current  Anticoagulation Instructions: INR 2.7  Continue same dosage of Coumadin 1 tablet every day. Recheck in 4 weeks.

## 2011-01-24 NOTE — Progress Notes (Signed)
Summary: Statin History Brought by Patient  Statin History Brought by Patient   Imported By: Lanelle Bal 03/24/2010 10:38:50  _____________________________________________________________________  External Attachment:    Type:   Image     Comment:   External Document

## 2011-01-24 NOTE — Progress Notes (Signed)
Summary: correction to list of medicaton   done pfh,rn  Phone Note Call from Patient Call back at Home Phone [phone]   Caller: Patient Reason for Call: Talk to Nurse Details for Reason: Per pt calling, pt was seen this week by jh. a list of medication that was printed by pam .  that needed  some correction. # 19. duplicated- should be one day . # 8 & 21 are the same. but  # 8 is corrected. # 22 is the same as # 13..  Initial call taken by: Lorne Skeens,  February 22, 2010 11:11 AM  Follow-up for Phone Call        Left message to call back Deliah Goody, RN  February 27, 2010 2:27 PM  Medications corrected as patient requested Follow-up by: Charolotte Capuchin, RN,  February 27, 2010 4:43 PM

## 2011-01-24 NOTE — Progress Notes (Signed)
Summary: Culture Results  Phone Note Outgoing Call Call back at Home Phone [phone]   Call placed by: Shonna Chock CMA,  October 09, 2010 2:24 PM Call placed to: Patient Details for Reason: Culture rsults Summary of Call: Left message on voicemail for patient: Macrodantin called in Sunday 10/16 will eradicate trhis bacteria; complete entire course.   Shonna Chock CMA  October 09, 2010 2:25 PM

## 2011-01-24 NOTE — Medication Information (Signed)
Summary: ROV  Anticoagulant Therapy  Managed by: Cloyde Reams, RN, BSN Referring MD: Rollene Rotunda MD PCP: Marga Melnick MD Supervising MD: Daleen Squibb MD, Maisie Fus Indication 1: Atrial Fibrillation (ICD-427.31) Indication 2: stroke-embolic (ICD-436.0) Lab Used: [facility] [place] Site: [address] INR POC 2.5 INR RANGE 2 - 3   Health status changes: yes       Details: Pt states still has cough, residual from cold  Bleeding/hemorrhagic complications: yes       Details: Occ had blood tinged sputum secondary to recent cold.  Pt reports scant amts.  Recent/future hospitalizations: no    Any changes in medication regimen? yes       Details: Robitussin DM and saline nasal spray.   Recent/future dental: no  Any missed doses?: no       Is patient compliant with meds? yes       Allergies: 1)  ! Cipro 2)  ! Vicodin 3)  ! Prempro (Conj Estrog-Medroxyprogest Ace) 4)  ! Desmopressin Acetate (Desmopressin Acetate) 5)  ! Levaquin (Levofloxacin)  Anticoagulation Management History:      The patient is taking warfarin and comes in today for a routine follow up visit.  Positive risk factors for bleeding include an age of 14 years or older and history of CVA/TIA.  The bleeding index is 'intermediate risk'.  Positive CHADS2 values include History of HTN, Age > 70 years old, and Prior Stroke/CVA/TIA.  Negative CHADS2 values include History of Diabetes.  The start date was 01/21/2008.  Her last INR was 5.6 RATIO.  Anticoagulation responsible provider: Daleen Squibb MD, Maisie Fus.  INR POC: 2.5.  Cuvette Lot#: [id number].  Exp: 01/2012.    Anticoagulation Management Assessment/Plan:      The patient's current anticoagulation dose is Coumadin 5 mg tabs: Take as directed by coumadin clinic..  The target INR is 2.0-3.0.  The next INR is due 02/04/2011.  Anticoagulation instructions were given to patient.  Results were reviewed/authorized by Cloyde Reams, RN, BSN.  She was notified by Cloyde Reams RN.           Prior Anticoagulation Instructions: INR 2.7  Continue same dosage of Coumadin 1 tablet every day. Recheck in 4 weeks.  Current Anticoagulation Instructions: INR 2.5  Continue on same dosage of Coumadin 5mg  daily.  Recheck in 4 weeks.

## 2011-01-24 NOTE — Medication Information (Signed)
Summary: rov/tm  Anticoagulant Therapy  Managed by: Eda Keys, PharmD Referring MD: Rollene Rotunda MD PCP: Marga Melnick MD Supervising MD: Shirlee Latch MD, Leenah Seidner Indication 1: Atrial Fibrillation (ICD-427.31) Indication 2: stroke-embolic (ICD-436.0) Lab Used: [facility] [place] Site: [address] INR POC 2.4 INR RANGE 2 - 3  Dietary changes: no    Health status changes: no    Bleeding/hemorrhagic complications: no    Recent/future hospitalizations: no    Any changes in medication regimen? no    Recent/future dental: no  Any missed doses?: no       Is patient compliant with meds? yes       Allergies: 1)  ! Cipro 2)  ! Vicodin 3)  ! Prempro (Conj Estrog-Medroxyprogest Ace) 4)  ! Desmopressin Acetate (Desmopressin Acetate) 5)  ! Levaquin (Levofloxacin)  Anticoagulation Management History:      The patient is taking warfarin and comes in today for a routine follow up visit.  Positive risk factors for bleeding include an age of 33 years or older and history of CVA/TIA.  The bleeding index is 'intermediate risk'.  Positive CHADS2 values include History of HTN, Age > 67 years old, and Prior Stroke/CVA/TIA.  Negative CHADS2 values include History of Diabetes.  The start date was 01/21/2008.  Her last INR was 5.6 RATIO.  Anticoagulation responsible provider: Shirlee Latch MD, Talar Fraley.  INR POC: 2.4.  Cuvette Lot#: [id number].  Exp: 07/2011.    Anticoagulation Management Assessment/Plan:      The patient's current anticoagulation dose is Coumadin 5 mg tabs: Take as directed by coumadin clinic..  The target INR is 2.0-3.0.  The next INR is due 06/06/2010.  Anticoagulation instructions were given to patient.  Results were reviewed/authorized by Eda Keys, PharmD.  She was notified by Eda Keys.         Prior Anticoagulation Instructions: INR 2.5  Take 1/2 tablet today then resume same dose of 1 tablet daily   Current Anticoagulation Instructions: INR  2.4  Continue taking 1 tablet every day.  Return to clinic in 1 month.

## 2011-01-24 NOTE — Medication Information (Signed)
Summary: rov/tm  Anticoagulant Therapy  Managed by: Cloyde Reams, RN, BSN Referring MD: Rollene Rotunda MD PCP: Marga Melnick MD Supervising MD: Shirlee Latch MD, Montrae Braithwaite Indication 1: Atrial Fibrillation (ICD-427.31) Indication 2: stroke-embolic (ICD-436.0) Lab Used: [facility] [place] Site: [address] INR POC 2.2 INR RANGE 2 - 3  Dietary changes: no    Health status changes: no    Bleeding/hemorrhagic complications: no    Recent/future hospitalizations: no    Any changes in medication regimen? yes       Details: Took some macrodantin since last OV, off now.    Recent/future dental: no  Any missed doses?: yes     Details: Unsure, may have missed a dose last week.    Is patient compliant with meds? yes       Allergies (verified): 1)  ! Cipro 2)  ! Vicodin 3)  ! Prempro (Conj Estrog-Medroxyprogest Ace) 4)  ! Desmopressin Acetate (Desmopressin Acetate) 5)  ! Levaquin (Levofloxacin)  Anticoagulation Management History:      The patient is taking warfarin and comes in today for a routine follow up visit.  Positive risk factors for bleeding include an age of 75 years or older and history of CVA/TIA.  The bleeding index is 'intermediate risk'.  Positive CHADS2 values include History of HTN, Age > 75 years old, and Prior Stroke/CVA/TIA.  Negative CHADS2 values include History of Diabetes.  The start date was 01/21/2008.  Her last INR was 5.6 RATIO.  Anticoagulation responsible provider: Shirlee Latch MD, Nigel Wessman.  INR POC: 2.2.  Cuvette Lot#: [id number].  Exp: 03/2011.    Anticoagulation Management Assessment/Plan:      The patient's current anticoagulation dose is Coumadin 5 mg tabs: Take as directed by coumadin clinic..  The target INR is 2.0-3.0.  The next INR is due 02/19/2010.  Anticoagulation instructions were given to patient.  Results were reviewed/authorized by Cloyde Reams, RN, BSN.  She was notified by Cloyde Reams RN.         Prior Anticoagulation  Instructions: INR 2.8 Continue 5mg s daily. Recheck in 3 weeks.   Current Anticoagulation Instructions: INR 2.2  Continue on same dosage 5mg  daily.  Recheck in 4 weeks.

## 2011-01-24 NOTE — Medication Information (Signed)
Summary: ROV/LB  Anticoagulant Therapy  Managed by: Bethena Midget, RN, BSN Referring MD: Rollene Rotunda MD PCP: Marga Melnick MD Supervising MD: Clifton James MD, Cristal Deer Indication 1: Atrial Fibrillation (ICD-427.31) Indication 2: stroke-embolic (ICD-436.0) Lab Used: [facility] [place] Site: [address] INR POC 2.3 INR RANGE 2 - 3  Dietary changes: no    Health status changes: no    Bleeding/hemorrhagic complications: no    Recent/future hospitalizations: no    Any changes in medication regimen? no    Recent/future dental: no  Any missed doses?: no       Is patient compliant with meds? yes       Allergies: 1)  ! Cipro 2)  ! Vicodin 3)  ! Prempro (Conj Estrog-Medroxyprogest Ace) 4)  ! Desmopressin Acetate (Desmopressin Acetate) 5)  ! Levaquin (Levofloxacin)  Anticoagulation Management History:      The patient is taking warfarin and comes in today for a routine follow up visit.  Positive risk factors for bleeding include an age of 27 years or older and history of CVA/TIA.  The bleeding index is 'intermediate risk'.  Positive CHADS2 values include History of HTN, Age > 24 years old, and Prior Stroke/CVA/TIA.  Negative CHADS2 values include History of Diabetes.  The start date was 01/21/2008.  Her last INR was 5.6 RATIO.  Anticoagulation responsible provider: Clifton James MD, Cristal Deer.  INR POC: 2.3.  Cuvette Lot#: [id number].  Exp: 04/2011.    Anticoagulation Management Assessment/Plan:      The patient's current anticoagulation dose is Coumadin 5 mg tabs: Take as directed by coumadin clinic..  The target INR is 2.0-3.0.  The next INR is due 04/17/2010.  Anticoagulation instructions were given to patient.  Results were reviewed/authorized by Bethena Midget, RN, BSN.  She was notified by Bethena Midget, RN, BSN.         Prior Anticoagulation Instructions: INR 2.4  CONTINUE TAKING 1 TABLET (5MG ) DAILY.  RECHECK IN 4 WEEKS.  Current Anticoagulation  Instructions: INR 2.3 Continue 5mg s everyday. Recheck in 4 weeks.

## 2011-01-24 NOTE — Miscellaneous (Signed)
Summary: BONE DENSITY  Clinical Lists Changes  Orders: Added new Test order of T-Bone Densitometry (77080) - Signed Added new Test order of T-Lumbar Vertebral Assessment (77082) - Signed 

## 2011-01-24 NOTE — Assessment & Plan Note (Signed)
Summary: rov per pt call/lg   Visit Type:  Follow-up Primary Provider:  Marga Melnick MD  CC:  Atrial Fibrillation.  History of Present Illness: The patient returns for followup. Since I last saw her she had left shoulder hemiarthroplasty. We did see her in consultation during that hospitalization because her ventricular rate with her atrial fibrillation was elevated. She had no other cardiac complications. Since discharge she has done well. She's been out working in her garden and has had no problems shortness of breath with decreased exercise tolerance. She will occasionally feel her heart rate increased but she relaxes and things improved. She denies any chest pressure, neck or arm discomfort. She's had no PND or orthopnea.  Current Medications (verified): 1)  Vytorin 10-40 Mg Tabs (Ezetimibe-Simvastatin) .Marland Kitchen.. 1 Daily 2)  Levothroid 50 Mcg  Tabs (Levothyroxine Sodium) .Marland Kitchen.. 1 By Mouth Qd 3)  Digoxin 0.125 Mg Tabs (Digoxin) .... One By Mouth Daily 4)  Diltiazem Hcl Cr 240 Mg Xr24h-Cap (Diltiazem Hcl) .... Take 1 Capsule By Mouth Once A Day 5)  Basa 81mg  .... 1 By Mouth Qd 6)  Biotin .Marland Kitchen.. 1 By Mouth Qd 7)  Super Cal-Mag-D [phone] Mg-Mg-Unit Tabs (Calcium-Magnesium-Vitamin D) .... Take 1 Tablet By Mouth Two Times A Day 8)  Vita Super Qd 9)  Vit C 500mg  10)  Vitamin D 2000 Unit Tabs (Cholecalciferol) .... Once Daily 11)  Magnesium 500 Mg Qd 12)  Tylenol Pm Prn 13)  Isopto Hyoscine 0.25 % Soln (Scopolamine Hbr) .Marland Kitchen.. 1 Gtt in L Eye in Pm 14)  Cranberry Concentrate 500 Mg Caps (Cranberry) .... 2 Caps Daily. 15)  Coumadin 5 Mg Tabs (Warfarin Sodium) .... Take As Directed By Coumadin Clinic. 16)  Timolol Maleate 0.5 % Soln (Timolol Maleate) .Marland Kitchen.. 1 Gtt in L Eye Each Am 17)  Lumigan 0.03 % Soln (Bimatoprost) .Marland Kitchen.. 1 Gtt R Eye At Bedtime 18)  Ensure Plus  Liqd (Nutritional Supplements) .... 8 Oz Once Daily 19)  Ensure Plus  Liqd (Nutritional Supplements) .Marland Kitchen.. 1 Can Two Times A Day 20)   Lorazepam 0.5 Mg Tabs (Lorazepam) .... As Needed For Anxiety 21)  Multivitamins   Tabs (Multiple Vitamin) .Marland Kitchen.. 1 By Mouth Daily 22)  Osopto Hyoscine 0.25% .Marland Kitchen.. 1 Gtt Daily  Allergies (verified): 1)  ! Cipro 2)  ! Vicodin 3)  ! Prempro (Conj Estrog-Medroxyprogest Ace) 4)  ! Desmopressin Acetate (Desmopressin Acetate) 5)  ! Levaquin (Levofloxacin)  Past History:  Past Medical History: Colonic polyps, hx of ,last colonoscopy 2005,repeat 2010  Hyperlipidemia Osteoporosis Atrial Fibrillation Cerebrovascular accident, hx of 01/09 Fx RUE 1995; Fx LUE 2009, Dr Cleophas Dunker  Past Surgical History: Reviewed history from 08/08/2009 and no changes required. Tonsillectomy & adenoidectomy Vocal cord polyps Thyroid needle biopsy Appendectomy Cataract extraction  bilat Colon polypectomy, diverticulosis 2005  Review of Systems       As stated in the HPI and negative for all other systems.   Vital Signs:  Patient profile:   77 year old female Height:      67.5 inches Weight:      134 pounds BMI:     20.75 Pulse rate:   78 / minute Resp:     16 per minute BP sitting:   102 / 62  (right arm)  Vitals Entered By: Marrion Coy, CNA (February 19, 2010 2:01 PM)  Physical Exam  General:  Well developed, well nourished, in no acute distress. Head:  normocephalic and atraumatic Eyes:  left pupil nonreactive, anisocoria Mouth:  Teeth,  gums and palate normal. Oral mucosa normal. Neck:  Neck supple, no JVD. No masses, thyromegaly or abnormal cervical nodes. Chest Wall:  no deformities or breast masses noted Lungs:  Clear bilaterally to auscultation and percussion. Heart:  Non-displaced PMI, chest non-tender; irregular rate and rhythm, S1, S2 without murmurs, rubs or gallops. Carotid upstroke normal, no bruit. Normal abdominal aortic size, no bruits. Femorals normal pulses, no bruits. Pedals normal pulses. Trace ankle edema, no varicosities. Abdomen:  Bowel sounds positive; abdomen soft and  non-tender without masses, organomegaly, or hernias noted. No hepatosplenomegaly. Msk:  Back normal, normal gait. Muscle strength and tone normal. Extremities:  No clubbing or cyanosis. Neurologic:  Alert and oriented x 3. Skin:  Intact without lesions or rashes. Psych:  Normal affect.   EKG  Procedure date:  02/19/2010  Findings:      atrial fibrillation, rate 78, axis within normal limits, intervals within normal limits, no acute ST-T wave changes, RSR prime V1.  Impression & Recommendations:  Problem # 1:  FIBRILLATION, ATRIAL (ICD-427.31) The the patient appears to have persistent atrial fibrillation. She tolerates this rhythm.she remains on Coumadin and has good rate control. No change in therapy is indicated.  Problem # 2:  CAROTID STENOSIS (ICD-433.10) She has some mild carotid stenosis bilaterally at the time of her CVA in 2009. I will arrange followup carotid Dopplers when I see her next year. She will continue with risk reduction.  Problem # 3:  HYPERTENSION, ESSENTIAL NOS (ICD-401.9) Her blood pressure is controlled and she will continue the meds as listed.  Other Orders: EKG w/ Interpretation (93000)  Patient Instructions: 1)  Your physician recommends that you schedule a follow-up appointment in: 1 yr rov 2)  Your physician recommends that you continue on your current medications as directed. Please refer to the Current Medication list given to you today. Prescriptions: DIGOXIN 0.125 MG TABS (DIGOXIN) one by mouth daily  #90 x 3   Entered by:   Charolotte Capuchin, RN   Authorized by:   Rollene Rotunda, MD, [facility]   Signed by:   Charolotte Capuchin, RN on 02/19/2010   Method used:   Electronically to        [facility] [address] (retail)       [phone]       [place], [place]  [address]       Ph: [phone]       Fax: [phone]   RxID:   [id number] DILTIAZEM HCL CR 240 MG XR24H-CAP (DILTIAZEM HCL) Take 1 capsule  by mouth once a day  #90 x 3   Entered by:   Charolotte Capuchin, RN   Authorized by:   Rollene Rotunda, MD, [facility]   Signed by:   Charolotte Capuchin, RN on 02/19/2010   Method used:   Electronically to        [facility] [address] (retail)       [phone]       [place], [place]  [address]       Ph: [phone]       Fax: [phone]   RxID:   [id number]

## 2011-01-24 NOTE — Medication Information (Signed)
Summary: rov/ewj  Anticoagulant Therapy  Managed by: Jeralene Peters, PharmD Referring MD: Rollene Rotunda MD PCP: Marga Melnick MD Supervising MD: Shirlee Latch MD, Mindel Friscia Indication 1: Atrial Fibrillation (ICD-427.31) Indication 2: stroke-embolic (ICD-436.0) Lab Used: [facility] [place] Site: [address] INR POC 2.4 INR RANGE 2 - 3  Dietary changes: no    Health status changes: no    Bleeding/hemorrhagic complications: no    Recent/future hospitalizations: no    Any changes in medication regimen? no    Recent/future dental: no  Any missed doses?: no       Is patient compliant with meds? yes       Current Medications (verified): 1)  Vytorin 10-40 Mg Tabs (Ezetimibe-Simvastatin) .Marland Kitchen.. 1 Daily 2)  Levothroid 50 Mcg  Tabs (Levothyroxine Sodium) .Marland Kitchen.. 1 By Mouth Qd 3)  Digoxin 0.125 Mg Tabs (Digoxin) .... One By Mouth Daily 4)  Diltiazem Hcl Cr 240 Mg Xr24h-Cap (Diltiazem Hcl) .... Take 1 Capsule By Mouth Once A Day 5)  Basa 81mg  .... 1 By Mouth Qd 6)  Biotin .Marland Kitchen.. 1 By Mouth Qd 7)  Super Cal-Mag-D [phone] Mg-Mg-Unit Tabs (Calcium-Magnesium-Vitamin D) .... Take 1 Tablet By Mouth Two Times A Day 8)  Vita Super Qd 9)  Vit C 500mg  10)  Vitamin D 2000 Unit Tabs (Cholecalciferol) .... Once Daily 11)  Magnesium 500 Mg Qd 12)  Tylenol Pm Prn 13)  Isopto Hyoscine 0.25 % Soln (Scopolamine Hbr) .Marland Kitchen.. 1 Gtt in L Eye in Pm 14)  Cranberry Concentrate 500 Mg Caps (Cranberry) .... 2 Caps Daily. 15)  Coumadin 5 Mg Tabs (Warfarin Sodium) .... Take As Directed By Coumadin Clinic. 16)  Timolol Maleate 0.5 % Soln (Timolol Maleate) .Marland Kitchen.. 1 Gtt in L Eye Each Am 17)  Lumigan 0.03 % Soln (Bimatoprost) .Marland Kitchen.. 1 Gtt R Eye At Bedtime 18)  Ensure Plus  Liqd (Nutritional Supplements) .... 8 Oz Once Daily 19)  Ensure Plus  Liqd (Nutritional Supplements) .Marland Kitchen.. 1 Can Two Times A Day 20)  Lorazepam 0.5 Mg Tabs (Lorazepam) .... As Needed For Anxiety  Allergies (verified): 1)  ! Cipro 2)  !  Vicodin 3)  ! Prempro (Conj Estrog-Medroxyprogest Ace) 4)  ! Desmopressin Acetate (Desmopressin Acetate) 5)  ! Levaquin (Levofloxacin)  Anticoagulation Management History:      The patient is taking warfarin and comes in today for a routine follow up visit.  Positive risk factors for bleeding include an age of 75 years or older and history of CVA/TIA.  The bleeding index is 'intermediate risk'.  Positive CHADS2 values include History of HTN, Age > 75 years old, and Prior Stroke/CVA/TIA.  Negative CHADS2 values include History of Diabetes.  The start date was 01/21/2008.  Her last INR was 5.6 RATIO.  Anticoagulation responsible provider: Shirlee Latch MD, Chioma Mukherjee.  INR POC: 2.4.  Cuvette Lot#: [id number].  Exp: 04/2011.    Anticoagulation Management Assessment/Plan:      The patient's current anticoagulation dose is Coumadin 5 mg tabs: Take as directed by coumadin clinic..  The target INR is 2.0-3.0.  The next INR is due 03/19/2010.  Anticoagulation instructions were given to patient.  Results were reviewed/authorized by Jeralene Peters, PharmD.         Prior Anticoagulation Instructions: INR 2.2  Continue on same dosage 5mg  daily.  Recheck in 4 weeks.  Current Anticoagulation Instructions: INR 2.4  CONTINUE TAKING 1 TABLET (5MG ) DAILY.  RECHECK IN 4 WEEKS.

## 2011-01-24 NOTE — Medication Information (Signed)
Summary: rov/tm  Anticoagulant Therapy  Managed by: Bethena Midget, RN, BSN Referring MD: Rollene Rotunda MD PCP: Marga Melnick MD Supervising MD: Johney Frame MD, Fayrene Fearing Indication 1: Atrial Fibrillation (ICD-427.31) Indication 2: stroke-embolic (ICD-436.0) Lab Used: [facility] [place] Site: [address] INR POC 2.2 INR RANGE 2 - 3  Dietary changes: no    Health status changes: no    Bleeding/hemorrhagic complications: no    Recent/future hospitalizations: no    Any changes in medication regimen? no    Recent/future dental: no  Any missed doses?: no       Is patient compliant with meds? yes      Comments: 4 weeks per pt request, suggested 3 weeks.   Allergies: 1)  ! Cipro 2)  ! Vicodin 3)  ! Prempro (Conj Estrog-Medroxyprogest Ace) 4)  ! Desmopressin Acetate (Desmopressin Acetate) 5)  ! Levaquin (Levofloxacin)  Anticoagulation Management History:      The patient is taking warfarin and comes in today for a routine follow up visit.  Positive risk factors for bleeding include an age of 75 years or older and history of CVA/TIA.  The bleeding index is 'intermediate risk'.  Positive CHADS2 values include History of HTN, Age > 67 years old, and Prior Stroke/CVA/TIA.  Negative CHADS2 values include History of Diabetes.  The start date was 01/21/2008.  Her last INR was 5.6 RATIO.  Anticoagulation responsible provider: Herson Prichard MD, Fayrene Fearing.  INR POC: 2.2.  Cuvette Lot#: [id number].  Exp: 11/2011.    Anticoagulation Management Assessment/Plan:      The patient's current anticoagulation dose is Coumadin 5 mg tabs: Take as directed by coumadin clinic..  The target INR is 2.0-3.0.  The next INR is due 11/12/2010.  Anticoagulation instructions were given to patient.  Results were reviewed/authorized by Bethena Midget, RN, BSN.  She was notified by Bethena Midget, RN, BSN.         Prior Anticoagulation Instructions: INR 4.0 Skip today's dose then resume 5mg s everyday.  Recheck in 10  days.   Current Anticoagulation Instructions: INR 2.2 Continue 5mg s everyday. Recheck in 4 weeks.

## 2011-01-24 NOTE — Procedures (Signed)
Summary: Recall / [facility]  Recall /  [facility]   Imported By: Lennie Odor 05/24/2010 16:55:26  _____________________________________________________________________  External Attachment:    Type:   Image     Comment:   External Document

## 2011-01-24 NOTE — Assessment & Plan Note (Signed)
Summary: rto 6 months.cbs   Vital Signs:  Patient profile:   77 year old female Weight:      133.2 pounds Temp:     97.9 degrees F oral Pulse rate:   64 / minute Resp:     15 per minute BP sitting:   110 / 60  (left arm) Cuff size:   regular  Vitals Entered By: Shonna Chock CMA (September 12, 2010 10:31 AM)  Primary Care Provider:  Marga Melnick MD  CC:  Dysuria.  History of Present Illness: Hyperlipidemia Follow-Up      This is an 77 year old woman who presents for Hyperlipidemia follow-up.  The patient reports fatigue( since CVA), but denies muscle aches, GI upset, abdominal pain, flushing, itching, constipation, and diarrhea.  Other symptoms include palpitations in context of AF.  The patient denies the following symptoms: chest pain/pressure, exercise intolerance, dypsnea, syncope, and pedal edema.  Compliance with medications (by patient report) has been near 100%.  Dietary compliance has been good.  The patient reports exercising daily.  Adjunctive measures currently used by the patient include ASA.  Lipids reviewed; all @ goal. TSH is therapeutic. Dysuria      The patient also presents with Dysuria since 09/16.  The patient reports burning with urination, urinary frequency, and urgency, but denies hematuria and vaginal discharge.  Associated symptoms include pelvic discomfort. Rx: Azo & De Blanch with some response. The patient denies the following associated symptoms: nausea, vomiting, fever, shaking chills, flank pain, abdominal pain, and back pain.  History is significant for  UTI in 01/2010. She has seen Dr Isabel Caprice for UTIs.    Allergies: 1)  ! Cipro 2)  ! Vicodin 3)  ! Prempro (Conj Estrog-Medroxyprogest Ace) 4)  ! Desmopressin Acetate (Desmopressin Acetate) 5)  ! Levaquin (Levofloxacin)  Review of Systems Eyes:  Denies blurring and double vision. ENT:  Denies difficulty swallowing and hoarseness. Resp:  Complains of cough; She is concerned because her sister had lung  cancer.Sputum is clear but thick.No  PMH of asthma. She quit smoking 1995. Derm:  Denies changes in nail beds, dryness, and hair loss. Neuro:  Denies tingling; Numbness in feet @ night. Endo:  Complains of cold intolerance; denies heat intolerance.  Physical Exam  General:  Thin but in no acute distress; alert,appropriate and cooperative throughout examination Neck:  No deformities, masses, or tenderness noted. Thyroid small & firm, L lobe > R Lungs:  Normal respiratory effort, chest expands symmetrically. Lungs are clear to auscultation, no crackles or wheezes. Heart:  regular rhythm, bradycardia, and grade 1 /6 systolic murmur.   Abdomen:  Bowel sounds positive,abdomen soft  but slightly tender over bladder  without masses, organomegaly or hernias noted. Neurologic:  alert & oriented X3 and DTRs symmetrical and normal.   Skin:  Intact without suspicious lesions or rashes Cervical Nodes:  No lymphadenopathy noted Axillary Nodes:  No palpable lymphadenopathy Psych:  memory intact for recent and remote, normally interactive, and good eye contact.     Impression & Recommendations:  Problem # 1:  DYSURIA (ICD-788.1)  Orders: T-Culture, Urine ([id number])  Her updated medication list for this problem includes:    Nitrofurantoin Monohyd Macro 100 Mg Caps (Nitrofurantoin monohyd macro) .Marland Kitchen... 1 two times a day  with water  Problem # 2:  HYPERLIPIDEMIA (ICD-272.4) Lipids @ goal Her updated medication list for this problem includes:    Vytorin 10-40 Mg Tabs (Ezetimibe-simvastatin) .Marland Kitchen... 1 daily  Problem # 3:  HYPOTHYROIDISM (ICD-244.9) TSH @  goal Her updated medication list for this problem includes:    Levothroid 50 Mcg Tabs (Levothyroxine sodium) .Marland Kitchen... 1 by mouth qd  Problem # 4:  COUGH (ICD-786.2)  Orders: T-2 View CXR (71020TC)  Complete Medication List: 1)  Vytorin 10-40 Mg Tabs (Ezetimibe-simvastatin) .Marland Kitchen.. 1 daily 2)  Levothroid 50 Mcg Tabs (Levothyroxine sodium) .Marland Kitchen.. 1 by  mouth qd 3)  Digoxin 0.125 Mg Tabs (Digoxin) .... One by mouth daily 4)  Diltiazem Hcl Cr 240 Mg Xr24h-cap (Diltiazem hcl) .... Take 1 capsule by mouth once a day 5)  Basa 81mg   .... 1 by mouth qd 6)  Biotin  .Marland Kitchen.. 1 by mouth qd 7)  Cal1000-mag-zinc -400-15 Mg-mg-unit Tabs  .... Take 1 tablet by mouth two times a day 8)  Multivitamins Tabs (Multiple vitamin) .Marland Kitchen.. 1 by mouth once daily 9)  Vit C 500mg   10)  Vitamin D 2000 Unit Tabs (Cholecalciferol) .... Once daily 11)  Magnesium 400 Mg Qd  .Marland Kitchen.. 1 by mouth once daily 12)  Tylenol Pm Prn  13)  Cranberry Concentrate 500 Mg Caps (Cranberry) .... 2 caps daily. 14)  Coumadin 5 Mg Tabs (Warfarin sodium) .... Take as directed by coumadin clinic. 15)  Timolol Maleate 0.5 % Soln (Timolol maleate) .Marland Kitchen.. 1 gtt in l eye each am 16)  Lumigan 0.03 % Soln (Bimatoprost) .Marland Kitchen.. 1 gtt r eye at bedtime 17)  Ensure Plus Liqd (Nutritional supplements) .... 8 oz once daily 18)  Lorazepam 0.5 Mg Tabs (Lorazepam) .... As needed for anxiety 19)  Nitrofurantoin Monohyd Macro 100 Mg Caps (Nitrofurantoin monohyd macro) .Marland Kitchen.. 1 two times a day  with water  Patient Instructions: 1)  Drink as much  NON dairy fluid as you can tolerate for the next few days. Prescriptions: ENSURE PLUS  LIQD (NUTRITIONAL SUPPLEMENTS) 8 oz once daily  #90 x 1   Entered and Authorized by:   Marga Melnick MD   Signed by:   Marga Melnick MD on 09/12/2010   Method used:   Print then Give to Patient   RxID:   [id number] LORAZEPAM 0.5 MG TABS (LORAZEPAM) as needed for anxiety  #30 x 5   Entered and Authorized by:   Marga Melnick MD   Signed by:   Marga Melnick MD on 09/12/2010   Method used:   Print then Give to Patient   RxID:   [id number] NITROFURANTOIN MONOHYD MACRO 100 MG CAPS (NITROFURANTOIN MONOHYD MACRO) 1 two times a day  with water  #14 x 0   Entered and Authorized by:   Marga Melnick MD   Signed by:   Marga Melnick MD on 09/12/2010   Method used:   Faxed to ...        [facility] [address] (retail)       [phone]       [place], [place]  [address]       Ph: [phone]       Fax: [phone]   RxID:   [id number]

## 2011-01-24 NOTE — Medication Information (Signed)
Summary: rov/tm  Anticoagulant Therapy  Managed by: Bethena Midget, RN, BSN Referring MD: Rollene Rotunda MD PCP: Marga Melnick MD Supervising MD: Shirlee Latch MD, Mechille Varghese Indication 1: Atrial Fibrillation (ICD-427.31) Indication 2: stroke-embolic (ICD-436.0) Lab Used: [facility] [place] Site: [address] INR POC 2.2 INR RANGE 2 - 3  Dietary changes: no    Health status changes: no    Bleeding/hemorrhagic complications: no    Recent/future hospitalizations: no    Any changes in medication regimen? no    Recent/future dental: no  Any missed doses?: no       Is patient compliant with meds? yes       Allergies: 1)  ! Cipro 2)  ! Vicodin 3)  ! Prempro (Conj Estrog-Medroxyprogest Ace) 4)  ! Desmopressin Acetate (Desmopressin Acetate) 5)  ! Levaquin (Levofloxacin)  Anticoagulation Management History:      The patient is taking warfarin and comes in today for a routine follow up visit.  Positive risk factors for bleeding include an age of 15 years or older and history of CVA/TIA.  The bleeding index is 'intermediate risk'.  Positive CHADS2 values include History of HTN, Age > 40 years old, and Prior Stroke/CVA/TIA.  Negative CHADS2 values include History of Diabetes.  The start date was 01/21/2008.  Her last INR was 5.6 RATIO.  Anticoagulation responsible provider: Shirlee Latch MD, Alaylah Heatherington.  INR POC: 2.2.  Cuvette Lot#: [id number].  Exp: 09/2011.    Anticoagulation Management Assessment/Plan:      The patient's current anticoagulation dose is Coumadin 5 mg tabs: Take as directed by coumadin clinic..  The target INR is 2.0-3.0.  The next INR is due 08/09/2010.  Anticoagulation instructions were given to patient.  Results were reviewed/authorized by Bethena Midget, RN, BSN.  She was notified by Bethena Midget, RN, BSN.         Prior Anticoagulation Instructions: INR 2.4 Continue 5mg s everyday. Recheck in 4 weeks.   Current Anticoagulation Instructions: INR 2.2 Continue 5mg s  everyday. Recheck in 4 weeks.

## 2011-01-24 NOTE — Medication Information (Signed)
Summary: rov/sp  Anticoagulant Therapy  Managed by: Lyna Poser, PharmD Referring MD: Rollene Rotunda MD PCP: Marga Melnick MD Supervising MD: Shirlee Latch MD,[place] Indication 1: Atrial Fibrillation (ICD-427.31) Indication 2: stroke-embolic (ICD-436.0) Lab Used: [facility]  Site: [address] INR POC 2.1 INR RANGE 2 - 3  Dietary changes: no    Health status changes: no    Bleeding/hemorrhagic complications: no    Recent/future hospitalizations: no    Any changes in medication regimen? no    Recent/future dental: no  Any missed doses?: no       Is patient compliant with meds? yes       Allergies: 1)  ! Cipro 2)  ! Vicodin 3)  ! Prempro (Conj Estrog-Medroxyprogest Ace) 4)  ! Desmopressin Acetate (Desmopressin Acetate) 5)  ! Levaquin (Levofloxacin)  Anticoagulation Management History:      The patient is taking warfarin and comes in today for a routine follow up visit.  Positive risk factors for bleeding include an age of 75 years or older and history of CVA/TIA.  The bleeding index is 'intermediate risk'.  Positive CHADS2 values include History of HTN, Age > 70 years old, and Prior Stroke/CVA/TIA.  Negative CHADS2 values include History of Diabetes.  The start date was 01/21/2008.  Her last INR was 5.6 RATIO.  Anticoagulation responsible provider: Shirlee Latch MD,[place].  INR POC: 2.1.  Cuvette Lot#: [id number].  Exp: 09/2011.    Anticoagulation Management Assessment/Plan:      The patient's current anticoagulation dose is Coumadin 5 mg tabs: Take as directed by coumadin clinic..  The target INR is 2.0-3.0.  The next INR is due 10/04/2010.  Anticoagulation instructions were given to patient.  Results were reviewed/authorized by Lyna Poser, PharmD.  She was notified by Cloyde Reams RN.         Prior Anticoagulation Instructions: INR 1.9  Take 1 1/2 tablets today then resume same dose of 1 tablet every day.    Current Anticoagulation Instructions: INR  2.1 Continue taking 1 tablet everyday. No changes today. We'll see you in 4 weeks.

## 2011-01-24 NOTE — Assessment & Plan Note (Signed)
Summary: Kristine Simmons MEDS,LABS PRIOR,MEDICARE & BCBS/RH......   Vital Signs:  Patient profile:   77 year old female Height:      67.5 inches Weight:      133.4 pounds Temp:     98.4 degrees F oral Pulse rate:   64 / minute Resp:     16 per minute BP sitting:   136 / 70  (left arm) Cuff size:   large  Vitals Entered By: Shonna Chock (March 13, 2010 10:58 AM) CC: Yearly follow-up/discuss labs,  Heart Followed-up by Cardiologist. Comments REVIEWED MED LIST, PATIENT AGREED DOSE AND INSTRUCTION CORRECT    Primary Care Provider:  Marga Melnick MD  CC:  Yearly follow-up/discuss labs and Heart Followed-up by Cardiologist..  History of Present Illness: Lipids reviewed; Lipids are excellent on Vytorin 10/40. LDL 58; HDL 62.It costs ?$ 145  every 3 months. Lipids had not achieved goal on "a 1/2 dozen cholesterol medicines " , including Crestor . No specific diet; exercises done @ home from Physical Therapy w/o C-P symptoms. Palpitations noted only @ night when walking to baathroom; oughing reverses these.  Allergies: 1)  ! Cipro 2)  ! Vicodin 3)  ! Prempro (Conj Estrog-Medroxyprogest Ace) 4)  ! Desmopressin Acetate (Desmopressin Acetate) 5)  ! Levaquin (Levofloxacin)  Past History:  Past Medical History: Colonic polyps, hx of ,last colonoscopy 2005,repeat was due  2010, Dr Jarold Motto Hyperlipidemia Osteoporosis Atrial Fibrillation Cerebrovascular accident, hx of 01/09 Fx RUE 1995; Fx LUE 2009, Dr Cleophas Dunker OS blindness from CVA; Glaucoma OD, [facility]  Past Surgical History: Tonsillectomy & adenoidectomy Vocal cord polyps Thyroid needle biopsy Appendectomy Cataract extraction  bilat Colon polypectomy, diverticulosis 2005, Dr Jarold Motto  Review of Systems General:  Denies chills, fever, sweats, and weight loss. Eyes:  Complains of vision loss-1 eye; OS blindness from CVA. ENT:  Complains of nasal congestion; denies difficulty swallowing and hoarseness. CV:  Complains of  palpitations; denies chest pain or discomfort, leg cramps with exertion, and shortness of breath with exertion. Resp:  Complains of cough; Cough  with meals; sputum after meal. GI:  Denies abdominal pain, bloody stools, dark tarry stools, and indigestion; No dysphagia. Derm:  Complains of hair loss; denies changes in nail beds and dryness. Neuro:  No new neuro symptoms. Endo:  Complains of cold intolerance; denies excessive hunger, excessive thirst, excessive urination, and heat intolerance. Allergy:  Denies itching eyes and sneezing.  Physical Exam  General:  in no acute distress; alert,appropriate and cooperative throughout examination Eyes:  Ptosis  & blindness OS Ears:  External ear exam shows no significant lesions or deformities.  Otoscopic examination reveals clear canals, tympanic membranes are intact bilaterally without bulging, retraction, inflammation or discharge. Hearing is grossly normal bilaterally. Nose:  External nasal examination shows no deformity or inflammation. Nasal mucosa are pink and moist without lesions or exudates. Mouth:  Oral mucosa and oropharynx without lesions or exudates.  Teeth in good repair. Neck:  No deformities, masses, or tenderness noted.L lobe small & firm; R small Lungs:  Normal respiratory effort, chest expands symmetrically. Lungs are clear to auscultation, no crackles or wheezes. Heart:  regular rhythm, no gallop, no rub, no JVD, no HJR, bradycardia, and grade 1 /6 systolic murmur.   Abdomen:  Bowel sounds positive,abdomen soft and non-tender without masses, organomegaly or hernias noted. No AAA Pulses:  R and L carotid,radial,dorsalis pedis and posterior tibial pulses are full and equal bilaterally. Bilateral carotid bruits Extremities:  No clubbing, cyanosis, edema. Neurologic:  alert & oriented  X3, strength normal in all extremities, and DTRs symmetrical and normal.   Skin:  Intact without suspicious lesions or rashes Cervical Nodes:  No  lymphadenopathy noted Axillary Nodes:  No palpable lymphadenopathy Psych:  memory intact for recent and remote, normally interactive, and good eye contact.     Impression & Recommendations:  Problem # 1:  HYPERLIPIDEMIA (ICD-272.4)  Lipids @ goal Her updated medication list for this problem includes:    Vytorin 10-40 Mg Tabs (Ezetimibe-simvastatin) .Marland Kitchen... 1 daily  Orders: Prescription Created Electronically [id number])  Problem # 2:  HYPERTENSION, ESSENTIAL NOS (ICD-401.9) controlled Her updated medication list for this problem includes:    Diltiazem Hcl Cr 240 Mg Xr24h-cap (Diltiazem hcl) .Marland Kitchen... Take 1 capsule by mouth once a day  Problem # 3:  CEREBROVASCULAR ACCIDENT, HX OF (ICD-V12.50)  Problem # 4:  PAROXYSMAL ATRIAL FIBRILLATION (ICD-427.31) PMH of Her updated medication list for this problem includes:    Digoxin 0.125 Mg Tabs (Digoxin) ..... One by mouth daily    Diltiazem Hcl Cr 240 Mg Xr24h-cap (Diltiazem hcl) .Marland Kitchen... Take 1 capsule by mouth once a day    Coumadin 5 Mg Tabs (Warfarin sodium) .Marland Kitchen... Take as directed by coumadin clinic.  Problem # 5:  HYPOTHYROIDISM (ICD-244.9)  Her updated medication list for this problem includes:    Levothroid 50 Mcg Tabs (Levothyroxine sodium) .Marland Kitchen... 1 by mouth qd  Problem # 6:  COLONIC POLYPS, HX OF (ICD-V12.72) as per Dr Jarold Motto  Complete Medication List: 1)  Vytorin 10-40 Mg Tabs (Ezetimibe-simvastatin) .Marland Kitchen.. 1 daily 2)  Levothroid 50 Mcg Tabs (Levothyroxine sodium) .Marland Kitchen.. 1 by mouth qd 3)  Digoxin 0.125 Mg Tabs (Digoxin) .... One by mouth daily 4)  Diltiazem Hcl Cr 240 Mg Xr24h-cap (Diltiazem hcl) .... Take 1 capsule by mouth once a day 5)  Basa 81mg   .... 1 by mouth qd 6)  Biotin  .Marland Kitchen.. 1 by mouth qd 7)  Super Cal-mag-d 500-250-125 Mg-mg-unit Tabs (Calcium-magnesium-vitamin d) .... Take 1 tablet by mouth two times a day 8)  Vita Super Qd  9)  Vit C 500mg   10)  Vitamin D 2000 Unit Tabs (Cholecalciferol) .... Once daily 11)   Magnesium 500 Mg Qd  12)  Tylenol Pm Prn  13)  Isopto Hyoscine 0.25 % Soln (Scopolamine hbr) .Marland Kitchen.. 1 gtt in l eye in pm 14)  Cranberry Concentrate 500 Mg Caps (Cranberry) .... 2 caps daily. 15)  Coumadin 5 Mg Tabs (Warfarin sodium) .... Take as directed by coumadin clinic. 16)  Timolol Maleate 0.5 % Soln (Timolol maleate) .Marland Kitchen.. 1 gtt in l eye each am 17)  Lumigan 0.03 % Soln (Bimatoprost) .Marland Kitchen.. 1 gtt r eye at bedtime 18)  Ensure Plus Liqd (Nutritional supplements) .... 8 oz once daily 19)  Lorazepam 0.5 Mg Tabs (Lorazepam) .... As needed for anxiety  Patient Instructions: 1)  Please schedule a follow-up appointment in 1 year OR  as needed. Prescriptions: LORAZEPAM 0.5 MG TABS (LORAZEPAM) as needed for anxiety  #30 x 5   Entered and Authorized by:   Marga Melnick MD   Signed by:   Marga Melnick MD on 03/13/2010   Method used:   Print then Give to Patient   RxID:   [id number] VYTORIN 10-40 MG TABS (EZETIMIBE-SIMVASTATIN) 1 daily  #90 Each x 3   Entered and Authorized by:   Marga Melnick MD   Signed by:   Marga Melnick MD on 03/13/2010   Method used:   Faxed to .Marland KitchenMarland Kitchen  [facility]* (retail)       [address]       [place], [place]  [address]       Ph: [phone]       Fax: [phone]   RxID:   [id number] VYTORIN 10-40 MG TABS (EZETIMIBE-SIMVASTATIN) 1 daily  #90 Each x 0   Entered and Authorized by:   Marga Melnick MD   Signed by:   Marga Melnick MD on 03/13/2010   Method used:   Faxed to ...       [facility]* (retail)       [address]       [place], [place]  [id number]       Ph: [phone]       Fax: [phone]   RxID:   [id number]

## 2011-01-24 NOTE — Progress Notes (Signed)
----   Converted from flag ---- ---- 08/06/2010 3:25 PM, Okey Regal [place] wrote: lab scheduled at [facility] [id number]- ov scheduled [id number]   ---- 08/05/2010 9:08 AM, Marga Melnick MD wrote: pleasae ask her to have fasting lipids, hepatic panel,CPK  ,BMET & TSH in 09 11 for 6 month F/U 7 see me 2-3 days later (272.4,995.20, 244.9) ------------------------------

## 2011-01-24 NOTE — Miscellaneous (Signed)
  Clinical Lists Changes  Observations: Added new observation of ECHOINTERP: Exercise Capacity: Adenosine study with no exercise. BP Response: Normal blood pressure response. Clinical Symptoms: No chest pain ECG Impression: No significant ST segment change suggestive of ischemia. Overall Impression: Normal stress nuclear study. Overall Impression Comments: Normal (08/22/2010 10:30)      Echocardiogram  Procedure date:  08/22/2010  Findings:      Exercise Capacity: Adenosine study with no exercise. BP Response: Normal blood pressure response. Clinical Symptoms: No chest pain ECG Impression: No significant ST segment change suggestive of ischemia. Overall Impression: Normal stress nuclear study. Overall Impression Comments: Normal

## 2011-02-04 ENCOUNTER — Encounter: Payer: Self-pay | Admitting: Cardiology

## 2011-02-04 ENCOUNTER — Encounter (INDEPENDENT_AMBULATORY_CARE_PROVIDER_SITE_OTHER): Payer: Medicare Other

## 2011-02-04 DIAGNOSIS — I4891 Unspecified atrial fibrillation: Secondary | ICD-10-CM

## 2011-02-04 DIAGNOSIS — Z7901 Long term (current) use of anticoagulants: Secondary | ICD-10-CM

## 2011-02-04 LAB — CONVERTED CEMR LAB: POC INR: 2.4

## 2011-02-12 DIAGNOSIS — I6350 Cerebral infarction due to unspecified occlusion or stenosis of unspecified cerebral artery: Secondary | ICD-10-CM

## 2011-02-12 DIAGNOSIS — Z8679 Personal history of other diseases of the circulatory system: Secondary | ICD-10-CM

## 2011-02-12 DIAGNOSIS — I4891 Unspecified atrial fibrillation: Secondary | ICD-10-CM

## 2011-02-13 NOTE — Medication Information (Signed)
Summary: Coumadin Clinic  Anticoagulant Therapy  Managed by: Georgina Pillion, PharmD Referring MD: Rollene Rotunda MD PCP: Marga Melnick MD Supervising MD: Daleen Squibb MD, Maisie Fus Indication 1: Atrial Fibrillation (ICD-427.31) Indication 2: stroke-embolic (ICD-436.0) Lab Used: [facility] [place] Site: [address] INR POC 2.4 INR RANGE 2 - 3  Dietary changes: no    Health status changes: no    Bleeding/hemorrhagic complications: no    Recent/future hospitalizations: no    Any changes in medication regimen? no    Recent/future dental: no  Any missed doses?: no       Is patient compliant with meds? yes       Allergies: 1)  ! Cipro 2)  ! Vicodin 3)  ! Prempro (Conj Estrog-Medroxyprogest Ace) 4)  ! Desmopressin Acetate (Desmopressin Acetate) 5)  ! Levaquin (Levofloxacin)  Anticoagulation Management History:      Positive risk factors for bleeding include an age of 73 years or older and history of CVA/TIA.  The bleeding index is 'intermediate risk'.  Positive CHADS2 values include History of HTN, Age > 86 years old, and Prior Stroke/CVA/TIA.  Negative CHADS2 values include History of Diabetes.  The start date was 01/21/2008.  Her last INR was 5.6 RATIO.  Anticoagulation responsible provider: Daleen Squibb MD, Maisie Fus.  INR POC: 2.4.  Cuvette Lot#: [id number].  Exp: 12/2011.    Anticoagulation Management Assessment/Plan:      The patient's current anticoagulation dose is Coumadin 5 mg tabs: Take as directed by coumadin clinic..  The target INR is 2.0-3.0.  The next INR is due 03/04/2011.  Anticoagulation instructions were given to patient.  Results were reviewed/authorized by Georgina Pillion, PharmD.  She was notified by Georgina Pillion PharmD.         Prior Anticoagulation Instructions: INR 2.5  Continue on same dosage of Coumadin 5mg  daily.  Recheck in 4 weeks.   Current Anticoagulation Instructions: Continue current regimen of 1 tablet (5 mg) daily.  INR 2.4

## 2011-02-21 ENCOUNTER — Ambulatory Visit (INDEPENDENT_AMBULATORY_CARE_PROVIDER_SITE_OTHER): Payer: Medicare Other | Admitting: Cardiology

## 2011-02-21 ENCOUNTER — Encounter: Payer: Self-pay | Admitting: Cardiology

## 2011-02-21 DIAGNOSIS — I4891 Unspecified atrial fibrillation: Secondary | ICD-10-CM

## 2011-02-21 DIAGNOSIS — E782 Mixed hyperlipidemia: Secondary | ICD-10-CM

## 2011-02-28 NOTE — Assessment & Plan Note (Signed)
Summary: 1 yr rov 427.31 pfh/sp   Visit Type:  Follow-up Primary Provider:  Marga Melnick MD  CC:  Atrial Fibrillation.  History of Present Illness: The patient presents for one year followup. Since I last saw her she has had some kind of viral or upper respiratory problem over the winter which slowed her down.  She has not been walking much he used to. She also has an unsteady her gait and so she doesn't want to do as much walking. With her level of activity she denies any chest pressure, neck or arm discomfort. She does not report palpitations, presyncope or syncope. She does not have any weight gain. She has no new shortness of breath.  There is no PND or orthopnea. She has some mild lower extremity edema.  Current Medications (verified): 1)  Vytorin 10-40 Mg Tabs (Ezetimibe-Simvastatin) .Marland Kitchen.. 1 Daily 2)  Levothroid 50 Mcg  Tabs (Levothyroxine Sodium) .Marland Kitchen.. 1 By Mouth Qd 3)  Digoxin 0.125 Mg Tabs (Digoxin) .... One By Mouth Daily 4)  Diltiazem Hcl Cr 240 Mg Xr24h-Cap (Diltiazem Hcl) .... Take 1 Capsule By Mouth Once A Day 5)  Basa 81mg  .... 1 By Mouth Qd 6)  Biotin .Marland Kitchen.. 1 By Mouth Qd 7)  Cal1000-Mag-Zinc -400-15 Mg-Mg-Unit Tabs .... Take 1 Tablet By Mouth Two Times A Day 8)  Multivitamins  Tabs (Multiple Vitamin) .Marland Kitchen.. 1 By Mouth Once Daily 9)  Vit C 500mg  10)  Vitamin D 2000 Unit Tabs (Cholecalciferol) .... Once Daily 11)  Magnesium 400 Mg Qd .Marland Kitchen.. 1 By Mouth Once Daily 12)  Tylenol Pm Prn 13)  Cranberry Concentrate 500 Mg Caps (Cranberry) .... 2 Caps Daily. 14)  Coumadin 5 Mg Tabs (Warfarin Sodium) .... Take As Directed By Coumadin Clinic. 15)  Timolol Maleate 0.5 % Soln (Timolol Maleate) .Marland Kitchen.. 1 Gtt in L Eye Each Am 16)  Lumigan 0.03 % Soln (Bimatoprost) .Marland Kitchen.. 1 Gtt R Eye At Bedtime 17)  Ensure Plus  Liqd (Nutritional Supplements) .... 8 Oz Once Daily 18)  Lorazepam 0.5 Mg Tabs (Lorazepam) .... As Needed For Anxiety  Allergies (verified): 1)  ! Cipro 2)  ! Vicodin 3)  ! Prempro  (Conj Estrog-Medroxyprogest Ace) 4)  ! Desmopressin Acetate (Desmopressin Acetate) 5)  ! Levaquin (Levofloxacin)  Past History:  Past Medical History: Reviewed history from 10/05/2010 and no changes required. 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 Tonsillectomy & adenoidectomy Vocal cord polyps Thyroid needle biopsy Appendectomy Cataract extraction  bilat Colon polypectomy, diverticulosis 2005, Dr Jarold Motto  Review of Systems       As stated in the HPI and negative for all other systems.   Vital Signs:  Patient profile:   77 year old female Height:      127 inches Weight:      129 pounds BMI:     5.64 Pulse rate:   76 / minute Resp:     16 per minute BP sitting:   104 / 60  (right arm)  Vitals Entered By: Marrion Coy, CNA (February 21, 2011 11:43 AM)  Physical Exam  General:  Well developed, well nourished, in no acute distress. Head:  normocephalic and atraumatic Neck:  Neck supple, no JVD. No masses, thyromegaly or abnormal cervical nodes. Chest Wall:  no deformities Lungs:  Clear bilaterally to auscultation and percussion. Abdomen:  Bowel sounds positive; abdomen soft and non-tender without masses, organomegaly, or hernias noted. No hepatosplenomegaly. Msk:  Back normal, normal gait. Muscle mildly diminished. Extremities:  mild bilateral ankle edema Neurologic:  Alert and oriented x 3. Skin:  Intact without lesions or rashes. Cervical Nodes:  no significant adenopathy Psych:  Normal affect.   Detailed Cardiovascular Exam  Neck    Carotids: Carotids full and equal bilaterally without bruits.      Neck Veins: Normal, no JVD.    Heart    Inspection: no deformities  or lifts noted.      Palpation: normal PMI with no thrills palpable.      Auscultation: irregular rate and rhythm, S1, S2 without murmurs, rubs, gallops, or clicks.    Vascular    Abdominal Aorta: no palpable masses, pulsations, or audible bruits.      Femoral Pulses: normal femoral pulses bilaterally.      Pedal Pulses: R and L carotid,radial,dorsalis pedis and posterior tibial pulses are full and equal bilaterally. Bilateral carotid bruits    Radial Pulses: normal radial pulses bilaterally.      Peripheral Circulation: no clubbing, cyanosis, with normal capillary refill.     EKG  Procedure date:  02/21/2011  Findings:      past atrial fibrillation, rate 70, axis within normal limits, and it was within normal limits, no acute ST-T wave changes  Impression & Recommendations:  Problem # 1:  PAROXYSMAL ATRIAL FIBRILLATION (ICD-427.31) The patient seems to be in persistent atrial fibrillation. She is not particularly bothered by this. I could consider Pradaxa therapy.  We would need to know if the cost is prohibitive.  Problem # 2:  HYPERTENSION, ESSENTIAL NOS (ICD-401.9) Her blood pressure is controlled.  She will continue on the meds as listed.  Problem # 3:  HYPERLIPIDEMIA (ICD-272.4) She will need to come off of her Vytorin given the fact she is on Cardizem. I will switch her to pravastatin 80 mg she should get a lipid profile in 8 weeks.  Other Orders: EKG w/ Interpretation (93000)  Patient Instructions: 1)  Your physician recommends that you schedule a follow-up appointment in: 1 yr with Dr Antoine Poche 2)  Your physician recommends that you continue on your current medications as directed. Please refer to the Current Medication list given to you today. Prescriptions: VYTORIN 10-40 MG TABS (EZETIMIBE-SIMVASTATIN) 1 daily  #90 Each x 3   Entered by:   Charolotte Capuchin, RN   Authorized by:   Rollene Rotunda, MD, [facility]   Signed by:   Charolotte Capuchin, RN on 02/21/2011    Method used:   Electronically to        [facility]* (retail)       [address]       [place], [place]  [id number]       Ph: [phone]       Fax: [phone]   RxID:   [id number]

## 2011-03-04 ENCOUNTER — Encounter: Payer: Self-pay | Admitting: Cardiology

## 2011-03-04 ENCOUNTER — Telehealth (INDEPENDENT_AMBULATORY_CARE_PROVIDER_SITE_OTHER): Payer: Self-pay | Admitting: *Deleted

## 2011-03-04 ENCOUNTER — Encounter (INDEPENDENT_AMBULATORY_CARE_PROVIDER_SITE_OTHER): Payer: Medicare Other

## 2011-03-04 DIAGNOSIS — Z7901 Long term (current) use of anticoagulants: Secondary | ICD-10-CM

## 2011-03-04 DIAGNOSIS — I4891 Unspecified atrial fibrillation: Secondary | ICD-10-CM

## 2011-03-04 DIAGNOSIS — I6789 Other cerebrovascular disease: Secondary | ICD-10-CM

## 2011-03-04 LAB — CONVERTED CEMR LAB: POC INR: 2.4

## 2011-03-12 ENCOUNTER — Emergency Department (HOSPITAL_COMMUNITY): Payer: Medicare Other

## 2011-03-12 ENCOUNTER — Emergency Department (HOSPITAL_COMMUNITY)
Admission: EM | Admit: 2011-03-12 | Discharge: 2011-03-12 | Disposition: A | Discharge status: Home / Self Care | Payer: Medicare Other | Attending: Emergency Medicine | Admitting: Emergency Medicine

## 2011-03-12 DIAGNOSIS — S0003XA Contusion of scalp, initial encounter: Secondary | ICD-10-CM | POA: Insufficient documentation

## 2011-03-12 DIAGNOSIS — W010XXA Fall on same level from slipping, tripping and stumbling without subsequent striking against object, initial encounter: Secondary | ICD-10-CM | POA: Insufficient documentation

## 2011-03-12 DIAGNOSIS — Z8673 Personal history of transient ischemic attack (TIA), and cerebral infarction without residual deficits: Secondary | ICD-10-CM | POA: Insufficient documentation

## 2011-03-12 DIAGNOSIS — I4891 Unspecified atrial fibrillation: Secondary | ICD-10-CM | POA: Insufficient documentation

## 2011-03-12 DIAGNOSIS — S1093XA Contusion of unspecified part of neck, initial encounter: Secondary | ICD-10-CM | POA: Insufficient documentation

## 2011-03-12 DIAGNOSIS — Y93H2 Activity, gardening and landscaping: Secondary | ICD-10-CM | POA: Insufficient documentation

## 2011-03-12 DIAGNOSIS — IMO0002 Reserved for concepts with insufficient information to code with codable children: Secondary | ICD-10-CM | POA: Insufficient documentation

## 2011-03-12 DIAGNOSIS — R51 Headache: Secondary | ICD-10-CM | POA: Insufficient documentation

## 2011-03-12 DIAGNOSIS — E039 Hypothyroidism, unspecified: Secondary | ICD-10-CM | POA: Insufficient documentation

## 2011-03-12 DIAGNOSIS — E7800 Pure hypercholesterolemia, unspecified: Secondary | ICD-10-CM | POA: Insufficient documentation

## 2011-03-12 NOTE — Medication Information (Signed)
Summary: rov/pc  Anticoagulant Therapy  Managed by: Cloyde Reams, RN, BSN Referring MD: Rollene Rotunda MD PCP: Marga Melnick MD Supervising MD: Antoine Poche MD, Fayrene Fearing Indication 1: Atrial Fibrillation (ICD-427.31) Indication 2: stroke-embolic (ICD-436.0) Lab Used: [facility] [place] Site: [address] INR POC 2.4 INR RANGE 2 - 3  Dietary changes: no    Health status changes: no    Bleeding/hemorrhagic complications: no    Recent/future hospitalizations: no    Any changes in medication regimen? no    Recent/future dental: no  Any missed doses?: no       Is patient compliant with meds? yes       Allergies: 1)  ! Cipro 2)  ! Vicodin 3)  ! Prempro (Conj Estrog-Medroxyprogest Ace) 4)  ! Desmopressin Acetate (Desmopressin Acetate) 5)  ! Levaquin (Levofloxacin)  Anticoagulation Management History:      The patient is taking warfarin and comes in today for a routine follow up visit.  Positive risk factors for bleeding include an age of 35 years or older and history of CVA/TIA.  The bleeding index is 'intermediate risk'.  Positive CHADS2 values include History of HTN, Age > 47 years old, and Prior Stroke/CVA/TIA.  Negative CHADS2 values include History of Diabetes.  The start date was 01/21/2008.  Her last INR was 5.6 RATIO.  Anticoagulation responsible provider: Antoine Poche MD, Fayrene Fearing.  INR POC: 2.4.  Cuvette Lot#: [id number].  Exp: 02/2012.    Anticoagulation Management Assessment/Plan:      The patient's current anticoagulation dose is Coumadin 5 mg tabs: Take as directed by coumadin clinic..  The target INR is 2.0-3.0.  The next INR is due 04/01/2011.  Anticoagulation instructions were given to patient.  Results were reviewed/authorized by Cloyde Reams, RN, BSN.  She was notified by Cloyde Reams RN.         Prior Anticoagulation Instructions: Continue current regimen of 1 tablet (5 mg) daily.  INR 2.4  Current Anticoagulation Instructions: INR 2.4  Continue on  same dosage 1 tablet daily.  Recheck in 4 weeks.

## 2011-03-12 NOTE — Progress Notes (Signed)
  Walk in Patient Form Recieved " Pt Needs Refill Medication" sent to [facility]  March 04, 2011 3:06 PM

## 2011-03-12 NOTE — Progress Notes (Signed)
Summary: refill  Phone Note Refill Request Message from:  Patient on March 04, 2011 11:43 AM  Refills Requested: Medication #1:  DIGOXIN 0.125 MG TABS one by mouth daily  Medication #2:  COUMADIN 5 MG TABS Take as directed by coumadin clinic. [facility]  Initial call taken by: Judie Grieve,  March 04, 2011 11:43 AM    Prescriptions: DIGOXIN 0.125 MG TABS (DIGOXIN) one by mouth daily  #90 x 3   Entered by:   Burnett Kanaris, CNA   Authorized by:   Rollene Rotunda, MD, [facility]   Signed by:   Burnett Kanaris, CNA on 03/05/2011   Method used:   Electronically to        [facility]* (retail)       [address]       [place], [place]  [address]       Ph: [phone]       Fax: [phone]   RxID:   [id number] COUMADIN 5 MG TABS (WARFARIN SODIUM) Take as directed by coumadin clinic.  #90 tabs x 1   Entered by:   Bethena Midget, RN, BSN   Authorized by:   Rollene Rotunda, MD, [facility]   Signed by:   Bethena Midget, RN, BSN on 03/04/2011   Method used:   Electronically to        [facility]* (retail)       [phone]       [place], [place]  [address]       Ph: [phone]       Fax: [phone]   RxID:   [id number]

## 2011-03-25 ENCOUNTER — Other Ambulatory Visit: Payer: Self-pay | Admitting: Internal Medicine

## 2011-03-28 LAB — COMPREHENSIVE METABOLIC PANEL
ALT: 20 U/L (ref 0–35)
AST: 27 U/L (ref 0–37)
Albumin: 4.1 g/dL (ref 3.5–5.2)
Alkaline Phosphatase: 42 U/L (ref 39–117)
Calcium: 9.6 mg/dL (ref 8.4–10.5)
GFR calc Af Amer: >60 mL/min (ref >60)
Potassium: 4.1 mEq/L (ref 3.5–5.1)
Sodium: 140 mEq/L (ref 135–145)
Total Protein: 6.5 g/dL (ref 6.0–8.3)

## 2011-03-28 LAB — BASIC METABOLIC PANEL
BUN: 7 mg/dL (ref 6–23)
BUN: 9 mg/dL (ref 6–23)
CO2: 32 mEq/L (ref 19–32)
Calcium: 8.2 mg/dL — ABNORMAL LOW (ref 8.4–10.5)
Calcium: 8.2 mg/dL — ABNORMAL LOW (ref 8.4–10.5)
Calcium: 8.4 mg/dL (ref 8.4–10.5)
Chloride: 100 mEq/L (ref 96–112)
Creatinine, Ser: 0.72 mg/dL (ref 0.4–1.2)
Creatinine, Ser: 0.76 mg/dL (ref 0.4–1.2)
GFR calc Af Amer: >60 mL/min (ref >60)
GFR calc Af Amer: >60 mL/min (ref >60)
GFR calc Af Amer: >60 mL/min (ref >60)
GFR calc Af Amer: >60 mL/min (ref >60)
GFR calc non Af Amer: >60 mL/min (ref >60)
GFR calc non Af Amer: >60 mL/min (ref >60)
GFR calc non Af Amer: >60 mL/min (ref >60)
GFR calc non Af Amer: >60 mL/min (ref >60)
Glucose, Bld: 93 mg/dL (ref 70–99)
Potassium: 3.7 mEq/L (ref 3.5–5.1)
Potassium: 4.1 mEq/L (ref 3.5–5.1)
Sodium: 135 mEq/L (ref 135–145)
Sodium: 140 mEq/L (ref 135–145)

## 2011-03-28 LAB — URINALYSIS, ROUTINE W REFLEX MICROSCOPIC
Bilirubin Urine: NEGATIVE
Hgb urine dipstick: NEGATIVE
Nitrite: POSITIVE — AB
Specific Gravity, Urine: 1.008 (ref 1.005–1.030)
Urobilinogen, UA: 0.2 mg/dL (ref 0.0–1.0)
pH: 7.5 (ref 5.0–8.0)

## 2011-03-28 LAB — CBC
HCT: 31 % — ABNORMAL LOW (ref 36.0–46.0)
HCT: 33 % — ABNORMAL LOW (ref 36.0–46.0)
Hemoglobin: 10.6 g/dL — ABNORMAL LOW (ref 12.0–15.0)
Hemoglobin: 14.4 g/dL (ref 12.0–15.0)
MCHC: 33.4 g/dL (ref 30.0–36.0)
MCHC: 34.3 g/dL (ref 30.0–36.0)
MCV: 89.1 fL (ref 78.0–100.0)
MCV: 89.3 fL (ref 78.0–100.0)
Platelets: 152 10*3/uL (ref 150–400)
Platelets: 153 10*3/uL (ref 150–400)
Platelets: 212 10*3/uL (ref 150–400)
RBC: 3.48 MIL/uL — ABNORMAL LOW (ref 3.87–5.11)
RBC: 3.7 MIL/uL — ABNORMAL LOW (ref 3.87–5.11)
RBC: 3.97 MIL/uL (ref 3.87–5.11)
RDW: 14.1 % (ref 11.5–15.5)
RDW: 14.1 % (ref 11.5–15.5)
WBC: 8.6 10*3/uL (ref 4.0–10.5)
WBC: 8.9 10*3/uL (ref 4.0–10.5)

## 2011-03-28 LAB — BLOOD GAS, ARTERIAL
Bicarbonate: 28 mEq/L — ABNORMAL HIGH (ref 20.0–24.0)
O2 Content: 4 L/min
O2 Saturation: 94.5 %
Patient temperature: 98.8

## 2011-03-28 LAB — DIFFERENTIAL
Basophils Relative: 1 % (ref 0–1)
Eosinophils Absolute: 0 10*3/uL (ref 0.0–0.7)
Lymphs Abs: 1.7 10*3/uL (ref 0.7–4.0)
Monocytes Absolute: 0.6 10*3/uL (ref 0.1–1.0)
Monocytes Relative: 10 % (ref 3–12)

## 2011-03-28 LAB — PROTIME-INR
INR: 1.83 — ABNORMAL HIGH (ref 0.00–1.49)
INR: 1.06 (ref 0.00–1.49)
INR: 1.71 — ABNORMAL HIGH (ref 0.00–1.49)
INR: 2.96 — ABNORMAL HIGH (ref 0.00–1.49)
Prothrombin Time: 21 seconds — ABNORMAL HIGH (ref 11.6–15.2)
Prothrombin Time: 13.7 seconds (ref 11.6–15.2)

## 2011-03-28 LAB — URINE MICROSCOPIC-ADD ON

## 2011-03-28 LAB — URINE CULTURE: Culture: NO GROWTH

## 2011-03-28 LAB — TYPE AND SCREEN: Antibody Screen: NEGATIVE

## 2011-03-28 LAB — MAGNESIUM: Magnesium: 1.8 mg/dL (ref 1.5–2.5)

## 2011-03-28 LAB — APTT: aPTT: 38 seconds — ABNORMAL HIGH (ref 24–37)

## 2011-04-01 ENCOUNTER — Encounter: Payer: Medicare Other | Admitting: *Deleted

## 2011-04-02 ENCOUNTER — Ambulatory Visit (INDEPENDENT_AMBULATORY_CARE_PROVIDER_SITE_OTHER): Payer: Medicare Other | Admitting: *Deleted

## 2011-04-02 DIAGNOSIS — I6350 Cerebral infarction due to unspecified occlusion or stenosis of unspecified cerebral artery: Secondary | ICD-10-CM

## 2011-04-02 DIAGNOSIS — Z7901 Long term (current) use of anticoagulants: Secondary | ICD-10-CM

## 2011-04-02 DIAGNOSIS — I4891 Unspecified atrial fibrillation: Secondary | ICD-10-CM

## 2011-04-02 DIAGNOSIS — Z8679 Personal history of other diseases of the circulatory system: Secondary | ICD-10-CM

## 2011-04-02 LAB — POCT INR: INR: 3

## 2011-04-02 NOTE — Patient Instructions (Signed)
INR 3.0 Continue taking 1 tablet everyday. Recheck in 4 weeks.

## 2011-04-10 ENCOUNTER — Telehealth: Payer: Self-pay | Admitting: Internal Medicine

## 2011-04-10 NOTE — Telephone Encounter (Signed)
Patient wants to know if she can have shingles vac -

## 2011-04-10 NOTE — Telephone Encounter (Signed)
No reason patient unable to get vaccine, patient's should check coverage first. Rx can be scheduled for here or rx sent to local pharmacy 

## 2011-04-11 MED ORDER — ZOSTER VACCINE LIVE 19400 UNT/0.65ML ~~LOC~~ SOLR: 0.6500 mL | Freq: Once | SUBCUTANEOUS | Status: AC

## 2011-04-11 NOTE — Telephone Encounter (Signed)
Patient wants rx for shingles vac - to be mailed to her  °

## 2011-04-11 NOTE — Telephone Encounter (Signed)
RX printed and placed at the front for mailout

## 2011-04-30 ENCOUNTER — Ambulatory Visit (INDEPENDENT_AMBULATORY_CARE_PROVIDER_SITE_OTHER): Payer: Medicare Other | Admitting: *Deleted

## 2011-04-30 DIAGNOSIS — I6350 Cerebral infarction due to unspecified occlusion or stenosis of unspecified cerebral artery: Secondary | ICD-10-CM

## 2011-04-30 DIAGNOSIS — Z8679 Personal history of other diseases of the circulatory system: Secondary | ICD-10-CM

## 2011-04-30 DIAGNOSIS — I4891 Unspecified atrial fibrillation: Secondary | ICD-10-CM

## 2011-05-01 ENCOUNTER — Other Ambulatory Visit: Payer: Self-pay | Admitting: Internal Medicine

## 2011-05-07 NOTE — Assessment & Plan Note (Signed)
Kristine Simmons is back regarding her left basal ganglia stroke.  She has  completed therapy now.  She is walking with a cane only.  Her right arm  is doing a bit better with precautionary measures to avoid compression  at the elbow.  She has intermittent problems with the left shoulder.  She describes her gait as stable.  She uses a cane for longer distances,  and depth perception issues are still a problem.  She sees ophthalmology  regarding her eye.   REVIEW OF SYSTEMS:  Symptoms as noted per the above.  She in general is  feeling pretty well.   SOCIAL HISTORY:  The patient is married.  Her husband is with her today.   PHYSICAL EXAMINATION:  VITAL SIGNS:  Blood pressure is 152/61, pulse 56,  respiratory rate 18, she is saturating 96% on room air.  NEUROLOGIC:  She is still walking slightly wide-based but with good  stability.  We tried heel-to-toe ambulation, and she lost balance a bit  to the left.  On cranial nerve testing, she continues to show a mild  right central VII.  She had good vision, except for a spot in the left  lower field which was notable for some darkness and feathery objects  that obscured vision and depth perception.  She had no pronator drift on  exam today.  Motor exam was near 4+-5/5 in both upper extremities today.  Sensory exam was nonfocal.  Cognitively, she was appropriate.  HEART:  Regular.  CHEST:  Clear.  ABDOMEN:  Soft, nontender.   ASSESSMENT:  1. Left basal ganglia/corona radiata infarct.  2. Paroxysmal atrial fibrillation.  3. 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  4. 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   PLAN:  1. Continue ophthalmology followup as advised.  2. I have nothing further to offer her today.  She is doing quite      well.  I would continue with the cane for depth perception and      balance.  3. Continue extension and padding of the right elbow as needed for      intermittent ulnar nerve symptoms.  I think      these are mild at best.  4. I will see her back on an as needed basis in the future.      Ranelle Oyster, M.D.  Electronically Signed     ZTS/MedQ  D:  05/17/2008 12:42:38  T:  05/17/2008 13:02:30  Job #:  [id number]   cc:   Titus Dubin. Alwyn Ren, MD,FACP,FCCP  [phone] [place]  [place] [address]   Rollene Rotunda, MD, [facility]  [address]  [address]  [place]  [place] [address]

## 2011-05-07 NOTE — Assessment & Plan Note (Signed)
[facility]                            CARDIOLOGY OFFICE NOTE   NAME:Kristine Simmons, Kristine Simmons                      MRN:          [id number]  DATE:10/20/2008                            DOB:          1927/11/19    PRIMARY CARE PHYSICIAN:  Titus Dubin. Alwyn Ren, MD, FACP, FCCP   REASON FOR PRESENTATION:  Evaluate the patient with atrial fibrillation.   HISTORY OF PRESENT ILLNESS:  The patient is a pleasant 77 year old, who  presents for followup of the above.  She has done well since I last saw  her.  She will occasionally have some palpitations that she believes are  paroxysm of the AFib.  They go away quickly.  She does some breathing  techniques and they might dissipate.  She has had no presyncope or  syncope.  She has had no chest pain or shortness of breath.  Unfortunately, she has lost vision completely in her left eye.  This is  related to an embolic retinal CVA, maybe 24 months ago.  She has been  losing weight despite she thinks eating okay and does not have a clear  etiology for this.  She is, however, getting along fairly well from a  cardiovascular standpoint.  She did fall and break her arm since I last  saw her, but this was not a loss of consciousness.  She lost her  footing.   PAST MEDICAL HISTORY:  Paroxysmal atrial fibrillation, hypothyroidism  with goiter, dyslipidemia, diverticulosis, chronic obstructive pulmonary  disease, degenerative joint disease of the lumbar spine, splenic and  hepatic granulomas, left retinal stroke 18 months ago, gastroesophageal  reflux disease, hernias, colonic polyps, right total hip replacement,  (non-ST-elevation myocardial infarction postoperative), left hip  replacement, left shoulder surgery, and embolic stroke with residual  right-sided weakness managed with thrombolytics.   ALLERGIES:  CIPRO, PREMPRO, DARVOCET, and DESMOPRESSIN.   MEDICATIONS:  1. Diltiazem 120 mg daily.  2. Aspirin 81 mg daily.  3.  Levothyroxine 50 mcg daily.  4. Cranberry caps.  5. Biotin.  6. Calcium.  7. Vitamin D.  8. Vitamin C.  9. Coumadin.  10.Vytorin 10/40.  11.Tylenol.  12.Digoxin 125 mcg daily.  13.Lumigan.   REVIEW OF SYSTEMS:  As stated in the HPI and otherwise negative for  other systems.   PHYSICAL EXAMINATION:  GENERAL:  The patient is in no distress.  VITAL SIGNS:  Blood pressure 128/73, heart rate 59 and regular, weight  123 pounds, and body mass index 19.  HEENT:  Eyelids, the left pupil is drooping; pupils, right is reactive;  fundi not visualized; oral mucosa unremarkable.  NECK:  No jugular venous distension at 45 degrees; carotid upstroke  brisk and symmetric; no bruits, no thyromegaly.  LYMPHATICS:  No cervical, axillary, or inguinal adenopathy.  LUNGS:  Clear to auscultation bilaterally.  BACK:  No costovertebral ankle tenderness.  CHEST:  Unremarkable.  HEART:  PMI not displaced or sustained; S1 and S2 within normal limits;  no S3, no S4; no clicks, no rubs, no murmurs.  ABDOMEN:  Flat; positive bowel sounds, normal in frequency and pitch; no  bruits, no rebound, no guarding; no midline pulsatile mass; no  organomegaly.  SKIN:  No rashes, no nodules.  EXTREMITIES:  2+ pulses, no edema.  NEURO:  Mild right-sided weakness, otherwise intact.   EKG sinus bradycardia, rate 51, axis within normal limits, intervals  within normal limits, and no acute ST-T wave changes.   ASSESSMENT AND PLAN:  1. Atrial fibrillation.  The patient has paroxysms of this, but he is      in particularly symptomatic.  At this point, no further      cardiovascular testing is suggested.  She will continue with the      Coumadin.  Given her previous significant strokes, we will use a      low-dose aspirin as long as she has no contraindications.  2. Non-Q-wave myocardial infarction.  This was managed medically.  She      has had no cardiovascular symptoms.  No further cardiovascular      testing is  suggested.  She will continue with risk reduction.  3. Followup.  I can see her back in 1 year or sooner if she has any      problems.     Rollene Rotunda, MD, [facility]  Electronically Signed    JH/MedQ  DD: 10/20/2008  DT: 10/21/2008  Job #: [id number]   cc:   Titus Dubin. Alwyn Ren, MD,FACP,FCCP

## 2011-05-07 NOTE — Assessment & Plan Note (Signed)
Kristine Simmons is here in followup of her left basal ganglia stroke.  She has  been transitioned to outpatient therapy and doing quite well.  She still  is having some issues with her coordination on the right upper and lower  extremity.  She complains of some numbness over the right hand,  particularly over the 4th and 5th fingers.  She uses a cane for balance  for longer distances.  She has had some problems with swallowing liquids  recently.  She is set up for a modified barium swallow next month.  Overall, her mood has been good.  She has remained motivated with  therapy.  She is over at outpatient therapy at [facility] currently.   REVIEW OF SYSTEMS:  Notable for the above.  She still reports occasional  palpitations, although her heart rate she reports, has generally been in  the 70s.  Other pertinent positives above and full review is in the  written health and history section.   SOCIAL HISTORY:  Patient is married and living with her husband, who is  with her today and supportive.   PHYSICAL EXAMINATION:  Blood pressure 133/69, pulse 71, respiratory rate  18.  She is satting 96% on room air.  Patient is pleasant, alert and  oriented x3.  Affect is bright and appropriate.  She walks today with a  slight list to the right.  Her gait is slightly wide-based as well.  In  general she is stable.  We performed cranial nerve testing today and she  had a right central 7 with slight dysarthria.  She swallowed well and  had intact gag reflex.  Visual fields are generally intact except for  premorbid loss in left upper quadrant.  Right hand is decreased in pin  prick and light touch along the ulnar nerve distribution.  She had a  positive Tinel's sign today at the right elbow.  Strength is generally  4+/5 on the right side.  She does lack fine motor coordination of the  hand.  She had a minimal pronator drift on the right.  HEART:  Regular.  CHEST:  Clear.  ABDOMEN:  Soft, nontender.  Mood was  bright and appropriate.   ASSESSMENT:  1. Left basal ganglia/corona radiata infarct.  2. Paroxysmal atrial fibrillation.  3. 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   PLAN:  1. Continue outpatient therapies.  She is making nice progress thus      far and will continue working on balance, range of motion, etc.  2. Discussed some measures for her right arm.  I would work on arm      extension as well as avoiding excessive flexion, particularly at      night with sleep in the right upper extremity.  She may want to try      an elbow pad as well.  I do not think this is very serious at this      point.  3. Agree with modified barium swallow for signs and symptoms of      dysphagia.  4. I will see the patient back in three months' time.      Ranelle Oyster, M.D.  Electronically Signed     ZTS/MedQ  D:  02/19/2008 11:35:14  T:  02/19/2008 21:30:13  Job #:  [id number]   cc:   Rollene Rotunda, MD, [facility]  [address]  [address]  [place]  [place] [address]

## 2011-05-07 NOTE — Assessment & Plan Note (Signed)
[facility]                         GASTROENTEROLOGY OFFICE NOTE   NAME:Kristine Simmons, Verdi                      MRN:          [id number]  DATE:07/07/2007                            DOB:          [birth year]    Kristine Simmons is a 77 year old white female with multiple medical  problems, referred by Dr. Alwyn Ren for evaluation of acid reflux and  regurgitation.   I have seen Kristine Simmons multiple times because of her current colon polyps,  and she has had multiple colonoscopies which were last performed in  October of 2005.  She had some small, hyperplastic polyps at that time,  large external hemorrhoids and diverticulosis.  She denies lower GI  complaints at this time.  She does have acid reflux almost daily if she  does not take Prilosec, but is currently using 20 mg of Prilosec every  other day.  She denies dysphagia or any hepatobiliary complaints.  Her  appetite is good and her weight is stable.  She has no family history of  esophageal or gastric carcinoma.  She is following a regular diet and is  doing well, otherwise.   PAST MEDICAL HISTORY:  Remarkable for hypercholesterolemia, degenerative  arthritis with recent right hip replacement by Dr. Cleophas Dunker, thyroid  nodules, osteoporosis, and she has had previous problems with  diverticulitis as mentioned above.  She is status post appendectomy.   The patient also has a history of recurrent atrial fibrillation,  apparently, during her hip replacement, there was some consideration to  cardioversion, but she apparently converted spontaneously.  She has  chronic hypercholesterolemia and chronic thyroid dysfunction, and is  followed by Dr. Antoine Poche and Dr. Rosalio Macadamia for her gynecological care.   MEDICATIONS:  1. Metoprolol 50 mg a day.  2. L-thyroxine 25 mcg 1-1/2 a day.  3. Vytorin 10/40 at bedtime.  4. Alendronate 70 mg a day.  5. Aspirin 325 mg a day.  6. Estrace at bedtime.  7. Variety of multivitamins  and glucosamine.  8. Omega-3 1000 mg twice a day.  9. Acidophilus daily.  10.Cranberry juice and black currant seed oil twice a day.   Apparently, in the past she has had reactions to CIPRO, PREMPRO,  HYDROCORTISONE, and DESMOPRESSIN.   FAMILY HISTORY:  Remarkable for breast cancer and ovarian cancer in  sisters, but no known gastrointestinal problems.   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  She has some  college education.  She is married and lives with her husband.  She does  not smoke, and uses ethanol socially.   REVIEW OF SYSTEMS:  Noncontributory without active cardiovascular or  pulmonary complaints at this time.   PHYSICAL EXAMINATION:  The exam shows her to be a healthy-appearing  white female in no distress appearing her stated age.  She is 5 feet 8 inches tall and weighs 142 pounds.  Blood pressure is  130/62 and the pulse was 58 and regular.  I could not appreciate stigmata of chronic liver disease.  She was in a regular rhythm without murmurs, gallops, or rubs, and her  chest was clear to  percussion and auscultation.  I could not appreciate hepatosplenomegaly, abdominal masses, or  tenderness.  Bowel sounds were normal.  Mental status was clear.  Peripheral extremities were unremarkable.   ASSESSMENT:  1. Acid reflux, well controlled with low-dose proton pump inhibitor      therapy with no warning signals to suggest carcinoma.  This      patient, really, is not very interested in endoscopic exam in any      case.  2. History of recurrent colon polyps with followup exam in 2 years'      time.  3. Diverticulosis.  Previous history of diverticulitis.  4. Chronic atrial fibrillation.  5. Degenerative arthritis with previous right hip replacement.  6. History of osteoporosis.  7. Chronic thyroid dysfunction, on Synthroid therapy.  8. History of hypercholesterolemia.  9. Family history of breast and ovarian carcinoma.   RECOMMENDATIONS:  I have  shown Elyanna our reflux movie for edification  and learning.  I have asked her to take Prilosec 20 mg daily 30 minutes  before breakfast for acid reflux, and we will see how she does  symptomatically.  When she is due for colonoscopy, we will again broach  the subject of whether or not to do endoscopy at that time for further  chronicity of her symptoms.  She is to continue her other medications,  follow up with Dr. Alwyn Ren as previously planned.     Vania Rea. Jarold Motto, MD, [facility], FAGA  Electronically Signed    DRP/MedQ  DD: 07/07/2007  DT: 07/07/2007  Job #: [id number]   cc:   Titus Dubin. Alwyn Ren, MD,FACP,FCCP

## 2011-05-07 NOTE — Discharge Summary (Signed)
NAME:  Kristine Simmons, Kristine Simmons               ACCOUNT NO.:  [id number]   MEDICAL RECORD NO.:  [id number]          PATIENT TYPE:  INP   LOCATION:  3038                         FACILITY:  [facility]   PHYSICIAN:  Pramod P. Pearlean Brownie, MD    DATE OF BIRTH:  12-01-1927   DATE OF ADMISSION:  01/07/2008  DATE OF DISCHARGE:  01/14/2008                               DISCHARGE SUMMARY   DISCHARGE DIAGNOSES:  1. Left basal ganglia/corona radiata embolic infarct secondary to      atrial fibrillation with small amount of hemorrhagic transformation      status post intravenous and intra-arterial tPA.  2. Atrial fibrillation.  3. Dyslipidemia.  4. Hypothyroidism.  5. Diverticular disease.  6. 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  7. Degenerative joint disease with right hip surgery January 2009.   DISCHARGE MEDICATIONS:  1. Levothyroxine 25 mcg a day.  2. Prilosec 20 mg a day.  3. Vytorin 10/40 mg a day.  4. Cardizem 120 mg CD a day.  5. Coumadin per pharmacy protocol.  6. Aspirin 81 mg p.o. daily until Coumadin therapeutic.  7. Digoxin 0.25 mg a day.   STUDIES PERFORMED:  1. CT of the brain on admission shows no acute abnormality.  Minimal      periventricular white matter changes, nonspecific.  2. Follow-up CT following interarterial tPA shows areas of pooling      contrast in the left basal ganglia and periventricular white      matter.  These are most likely areas of infarction.  Prominent      cortical veins on the right side near the insular cortex may be      related to luxury reperfusion, no hemispheric infarct for      intracranial mass lesion.  3. Chest x-ray shows ET tube at 8 cm above the carina, no      pneumothorax, chronic COPD, mild interstitial edema and left lower      lobe atelectasis versus consolidation.  4. Follow-up chest x-ray shows left IJ central venous catheter in the      left brachial cephalic vein near its junction in the SVC, no  pneumothorax. MRI of the brain shows moderate size left basal      ganglia corona radiata partially hemorrhagic infarct with mild mass      effect upon the left ventricle. Small partially hemorrhagic right      periopercular region.  Tiny non hemorrhagic left frontal lobe      infarct.  Moderate small vessel disease.  Paranasal sinus and      mastoid air cell pacification.  5. Follow-up chest x-ray shows slightly diminished aeration with some      increasing basilar atelectasis, ET tube in high position.  6. CT of the brain on the 17th shows moderate size left posterior      basal ganglia hemorrhagic infarct with the extension of non      hemorrhagic component into the mid to posterior left corona radiata      with mild impression upon the left lateral ventricle.  Mild right      periopercular infarct without CT evidence of hemorrhage. Tiny non      hemorrhagic left frontal lobe infarct.  7. Last CT done January 19 shows no significant change in the basal      ganglia hemorrhagic infarction.  8. Carotid Doppler shows bilateral moderate heterogeneous plaque and      bifurcation, proximal ICA, 40-60% ICA stenosis lowest in the scale      but no ICA stenosis.  9. Carotid Doppler shows EF of 60-65% with no left ventricular      regional wall motion abnormalities, no embolic source, small      pericardial effusion 4 mm.  10.EKG shows atrial fibrillation and septal infarct age undetermined.   LABORATORY STUDIES:  Hemoglobin on admission 12.9 down to 11.2,  hematocrit 37.6 to 32.5 and white blood cells 8.7 up to 11.4, otherwise  normal chemistry with glucose 119, otherwise normal coagulation studies  with INR 1.1.  Liver function tests with alkaline phosphatase 30,  otherwise normal, albumin 2.9.  Cardiac enzymes normal.  Cholesterol  162, triglycerides 98, HDL 62, LDL 80, calcium 7.8.  Urinalysis  negative.  TSH 1.048, hemoglobin A1c 5.8. TSH 0.767.  Homocystine 6.7.   HISTORY OF PRESENT  ILLNESS:  Ms. Kristine Simmons is a 77 year old  Caucasian female who presented with acute onset of right-sided  hemiparesis with severe dysarthria and aphasia that occurred at 9:30  a.m. witnessed by her husband.  At 9:15 a.m., she had been in her normal  state of health without any trouble in conversation and motor skills.  The patient was immediately brought to [facility] ER by EMS where she  was found to be in atrial fibrillation.  She had a blood pressure of  190/92 and was given labetalol and Cardene drip and lowered it. CT of  the brain showed no acute abnormality.  There did appear to be an  occlusion of the left middle cerebral artery. The patient was sent to  angiographic suite where a tPA was administered.  She was admitted to  the ICU after vessel reconstitution.   HOSPITAL COURSE:  The patient was found to have cardioembolic left MCA  infarct secondary to atrial fibrillation status post IV and intra-  arterial tPA with good recanalization.  She was weaned off the vent and  sheaths removed within the first 48 hours.  CT remained stable though  had some mild hemorrhagic transformation. The patient remained stable  post extubation except for a heart rate which was significantly elevated  in the 140s.  Cardiology was consulted and they came to manage it. Her  rate was then controlled with Cardizem. She was transferred to the floor  and rehab evaluated her.  She was felt to be a good candidate for  inpatient rehab with mild left hematemesis and ataxia.  She was started  on low-dose Coumadin on hospital day #6 for atrial fibrillation and  secondary stroke prevention.  Will keep aspirin 81 mg until INR  therapeutic   CONDITION ON DISCHARGE:  The patient was alert and oriented x3.  No  aphasia.  No dysarthria.  Her eye movements are full.  Her face is  symmetric.  She has no drift. She has decreased fine motor movement on  the right in her left orbits and her right. She drags her  right leg when  she ambulates with her walker.   DISCHARGE/PLAN:  1. Transfer to rehab for continuation of PT, OT and speech  therapy.  2. Coumadin per pharmacy protocol for secondary stroke prevention.  3. Monitor heart rate with new Cardizem. Cardiology to follow up as an      outpatient.  4. Follow-up primary care physician 1 month after discharge from rehab      (Dr. Excell Seltzer).  5. Followup with [facility] in 2-3 months after discharge.      Annie Main, N.P.    ______________________________  Sunny Schlein. Pearlean Brownie, MD    SB/MEDQ  D:  01/14/2008  T:  01/14/2008  Job:  [id number]   cc:   Gerrit Friends. Dietrich Pates, MD, [facility]  Lacretia Leigh. Quintella Reichert, M.D.

## 2011-05-07 NOTE — Assessment & Plan Note (Signed)
[facility]                            CARDIOLOGY OFFICE NOTE   NAME:Kristine Simmons, Quigley                      MRN:          [id number]  DATE:04/15/2008                            DOB:          12/29/1926    PRIMARY CARE PHYSICIAN:  Dr. Alwyn Ren.   REASON FOR PRESENTATION:  Evaluate the patient with atrial fibrillation.   HISTORY OF PRESENT ILLNESS:  The patient is pleasant 77 year old.  She  presents for follow-up.  She is recovering from her recent embolic  stroke.  She has had almost all her strength back, but she still has to  walk with a cane.  She is still a little fatigued.  She denies any chest  pressure, neck or arm discomfort.  She had no palpitation, presyncope or  syncope.  She had no PND or orthopnea.   PAST MEDICAL HISTORY:  1. Paroxysmal atrial fibrillation.  2. Hypothyroidism with goiter.  3. Dyslipidemia.  4. Diverticulosis.  5. Chronic obstructive pulmonary disease.  6. Degenerative joint disease of the lumbar spine.  7. Splenic and hepatic granulomas.  8. Left retinal stroke 18 months ago.  9. Gastroesophageal reflux disease.  10.Hernias.  11.Colonic polyps.  12.Prior right total hip replacement (non-ST-segment elevation      myocardial function postoperative).  13.Left hip placement.  14.Left shoulder surgery.  15.Recent embolic stroke with right sided residual weakness.  (This      was managed with thrombolytics).   ALLERGIES:  CIPRO, PREMPRO, DARVOCET. DESMOPRESSIN.   MEDICATIONS:  1. Diltiazem 120 mg daily.  2. Aspirin 81 mg.  3. Prilosec 20 mg daily.  4. Levothyroxine 50 mcg daily.  5. Cranberry.  6. Biotin.  7. Calcium.  8. Vitamin E.  9. Vitamin C.  10.Coumadin.  11.Vytorin 10/40 daily.  12.Tylenol.  13.Digoxin 125 mcg daily.  14.Magnesium.   REVIEW OF SYSTEMS:  As stated in HPI, and otherwise negative for other  systems.   PHYSICAL EXAMINATION:  GENERAL:  The patient is in no distress.  VITAL SIGNS:   Blood pressure 127/76, heart rate 56 and regular, weight  135 pounds.  HEENT:  Eyelids are unremarkable, pupils equal, round, reactive to  light.  Fundi not visualized, oral mucosa unremarkable.  NECK:  No jugular venous distention at 45 degrees.  Carotid upstroke  brisk and symmetrical.  No bruits, thyromegaly.  LYMPHATICS:  No cervical, axillary, inguinal.  LUNGS:  Clear to auscultation bilaterally.  BACK:  No costovertebral mass.  CHEST:  Unremarkable.  HEART:  PMI not displaced or sustained, S1-S2 within normal limits.  No  S3-S4, no clicks, rubs, murmurs.  ABDOMEN:  Flat, positive bowel sounds, normal in frequency pitch, no  bruits, rebound, guarding or midline pulsatile mass, organomegaly.  SKIN:  No rash.  No nodules.  EXTREMITIES:  Pulses 2+, no edema.  NEUROLOGICAL:  Mild right-sided weakness, lower extremity more than  upper.   STUDIES:  EKG sinus rhythm, rate 53, axis within normal limits,  intervals within normal.  No acute ST wave change.   ASSESSMENT/PLAN:  1. Atrial fibrillation.  The patient has paroxysmal atrial  fibrillation.  She is tolerating this rhythm.  At this point, no      further cardiovascular testing is suggested.  She will continue      with rate control and anticoagulation.  2. Status post embolic stroke.  The patient is recovering nicely from      this.  No further therapy is warranted.  3. Follow-up.  Will see the patient back in 6 months or sooner if      needed.     Rollene Rotunda, MD, [facility]  Electronically Signed    JH/MedQ  DD: 04/15/2008  DT: 04/15/2008  Job #: [id number]   cc:   Titus Dubin. Alwyn Ren, MD,FACP,FCCP

## 2011-05-07 NOTE — Assessment & Plan Note (Signed)
[facility]CARE                            CARDIOLOGY OFFICE NOTE   NAME:Kristine Simmons, Kristine Simmons                      MRN:          [id number]  DATE:05/29/2007                            DOB:          1927-08-18    PRIMARY CARE PHYSICIAN:  Dr. Alwyn Ren.   REASON FOR PRESENTATION:  Evaluate patient with atrial fibrillation.   HISTORY OF PRESENT ILLNESS:  Patient has paroxysmal atrial fibrillation.  She is 77 years old. She has had some palpitations at night which I  assume are atrial fibrillation. She is not bothered by this during the  day. She does not have any limitations in her activity. She is quite  active in the yard and doing chores. She denies any chest discomfort,  neck, or arm discomfort. She has had no PND or orthopnea.   PAST MEDICAL HISTORY:  Paroxysmal atrial fibrillation, non Q wave  myocardial infarction (type 2 related to her atrial fibrillation) well  preserved ejection fraction, right hip arthroplasty, dyslipidemia,  hypothyroidism, diverticulosis, degenerative joint disease, non  obstructive carotid plaque (30% to 59% on the right).   ALLERGIES:  CIPRO AND HYDROCODONE AND PREMPRO.   MEDICATIONS:  1. Glucosamine.  2. Biotin.  3. Cranberry.  4. Black current.  5. Multivitamin.  6. Vitamin C.  7. Calcium.  8. Aspirin 325 mg every day except Wednesday.  9. Estrace cream.  10.Lopressor 25 mg b.i.d.  11.Prilosec.  12.Levothyroxine.  13.Vytorin 10/40 a daily.  14.Fosamax.   REVIEW OF SYSTEMS:  As stated in the HPI and otherwise negative for  other systems.   PHYSICAL EXAMINATION:  Patient is in no distress. Blood pressure 118/62,  heart rate 50 and regular, weight 139 pounds, body mass index 21.  NECK: No jugular venous distension at 45 degrees, carotid upstrokes  brisk and symmetric. No bruits or thyromegaly.  LYMPHATICS: No cervical, axillary, inguinal adenopathy.  LUNGS: Clear to auscultation bilaterally.  BACK: No  costovertebral angle tenderness.  CHEST: Unremarkable.  HEART: PMI not displaced or sustained. S1 and S2 within normal limits.  No S3. No S4, clicks, rubs, murmurs.  ABDOMEN: Flat, positive bowel sounds normal to frequency and pitch. No  bruits, rebound or guarding. No midline pulsatile mass. No organomegaly.  SKIN: No rashes. No nodules.  EXTREMITIES: 2 + pulses, no edema.   ASSESSMENT/PLAN:  1. Paroxysmal atrial fibrillation, patient is not particularly      bothered by this. She does not want to take Coumadin and we have      discussed this multiple times. She is tolerating  this low dose of      beta blocker and I would continue it. It could be discontinued if      she has any problems with increasing brady arrhythmia, fatigue or      shortness of breath.  2. Carotid stenosis, she is due to have follow up with this in      February.  3. Follow up, I would like to see her back in about 18 months or      sooner if needed.     Rollene Rotunda,  MD, [facility]  Electronically Signed    JH/MedQ  DD: 05/29/2007  DT: 05/29/2007  Job #: [id number]   cc:   Titus Dubin. Alwyn Ren, MD,FACP,FCCP

## 2011-05-07 NOTE — H&P (Signed)
NAME:  Kristine Simmons, Kristine Simmons               ACCOUNT NO.:  [id number]   MEDICAL RECORD NO.:  [id number]          PATIENT TYPE:  INP   LOCATION:  1827                         FACILITY:  [facility]   PHYSICIAN:  Melvyn Novas, M.D.  DATE OF BIRTH:  1927-08-07   DATE OF ADMISSION:  01/07/2008  DATE OF DISCHARGE:                              HISTORY & PHYSICAL   HISTORY OF PRESENT ILLNESS:  This patient was evaluated in the [facility] ER by ER physician Dr. Nelva Nay.  A code stroke was called.  The patient presented with acute onset of right-sided weakness with  severe dysarthria, question aphasia at 9:30 witnessed by her husband.  She had been at 9:15 in her normal state of health without any trouble  in conservation and motor skills.  The patient was immediately brought  to the [facility] ER by EMS.  Here an EKG suggests that the patient has  a possible left atrial enlargement.  Her husband states that also she is now in normal sinus rhythm,  she has  a history of atrial fibrillation.  Postural support has just seen the patient and husband in the ER.   REVIEW OF SYSTEMS:  Acute onset at 9:30 a.m. of right-sided weakness,  flaccidity, loss of deep tendon reflexes, right facial droop, but the  patient is able to follow motor commands by verbal commands.    She is therefore not globally aphasic, but her facial droop  was quite  severe as is her uvula deviation and she is  dysarthric.  She was able to repeat words, but her speech is slurred.   Kristine Simmons presented here with a blood pressure of 190/92 which has  meanwhile after being given labetalol and then a Cardene drip lowered to  the 140/80 range, pulse rate is 55, respiratory rate is now 22.  The  patient's temperature is 37 degrees Celsius.  The patient has lungs  clear to auscultation.  Cor regular rate and rhythm.  There is no murmur.  There is a mild goiter.  There is uvula deviation  to the right, but the head is atraumatic,  normocephalic as is her body.  She does not show any bruises.  No paraspinal tenderness, abdominal  tenderness.  Normal bowel sounds.  No clubbing, cyanosis.   The patient herself cannot provide any information.  Her husband stated  that the above findings of the severe loss of motor strength on the  right side.   Labs have returned at 13.4 seconds prothrombin time.  INR of 1.0.  White  blood cell count 8.5, H&H is 14/42.  The patient's cardiac enzymes are pending.  The patient tox screen is pending.   SOCIAL HISTORY:  The patient is remarried.  No history of drug abuse.  Nonsmoker.  Occasional drinker.   FAMILY HISTORY:  Positive for hypertension.   MEDICAL HISTORY:  The patient had a retinal stroke in the right eye  which occurred more than 18 months ago.   CURRENT MEDICATIONS:  The patient is currently on Aggrenox, Biotin,  calcium, cranberry extract, glucosamine, levothyroxine, magnesium,  metoprolol  tartrate 25 mg XR, Prilosec 40 mg a day, vitamin C, vitamin  D, Vytorin 10/40.   ALLERGIES:  She is allergic to CIPRO, PREMPRO AND VICODIN.   CT of the head has returned without any evidence of acute stroke  findings.  The patient is by NIH stroke scale reaching 16 points.  She  is in the therapeutic window by time to be given t-PA.  We decided to  give her two-thirds of the t-PA dose and therefore allow the patient to  go to the angiography suite for a mechanical clot removal.  Dr. Lilian Coma is expecting the patient right now.   NEUROLOGIC EXAMINATION:  Mental status:  The patient appears alert.  She  is oriented.  She can follow motor commands with her left side.  She is  unable to speak clearly, has severe dysarthria.  Right-sided facial  droop, uvula deviation,  tongue deviation.  Head turn to the left is  weaker than to the right.  The patient cannot shoulder shrug on the  right.  Does not have antigravity movement in arm or leg.  She does not  have an upgoing toe  on the right.  Deep tendon reflexes on the right and  not obtainable, on the left 1+.  Again no swelling, no loss of pulses,  normal capillary refill time on the right.  Sensory to bilateral  simultaneous stimulation shows at this time no extinction.  The patient  responds to fine touch, pinprick and nods.  Gait had to be obviously  deferred.   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  No evidence of visual field impairment so that I do not think we are  dealing with a complete internal carotid artery occlusion.  The patient will be going to the angiography suite at 11:30 a.m. within  a little bit over 2 hours since stroke onset.    She will be admitted to the ICU preferably 3100 after t-PA and  angiography has been performed.  The patient is to be admitted for 24  hours with two IVs, Foley catheter in place and have no other blood  tests ordered until 24 hours from the t-PA dose being given.      Melvyn Novas, M.D.  Electronically Signed     CD/MEDQ  D:  01/07/2008  T:  01/07/2008  Job:  [id number]   cc:   Pramod P. Pearlean Brownie, MD

## 2011-05-07 NOTE — Assessment & Plan Note (Signed)
[facility]                            CARDIOLOGY OFFICE NOTE   NAME:Kristine Simmons, Veith                      MRN:          [id number]  DATE:02/02/2008                            DOB:          [birth year]    PRIMARY CARE PHYSICIAN:  Titus Dubin. Alwyn Ren, M.D., FACP, [facility]   REASON FOR CONSULTATION:  Evaluate patient with atrial fibrillation and  recent embolic stroke.   HISTORY OF PRESENT ILLNESS:  The patient is a pleasant 77 year old white  female who was admitted on January 07, 2008 with a left basal ganglia  embolic infarct with a small hemorrhagic transformation.  She was  treated with intravenous and intra-arterial TPA.  This was an embolic  event related to her paroxysmal atrial fibrillation.  She had previously  declined Coumadin therapy.  She had a significant right-sided  hemiparesis to begin with but made a wonderful recovery.  She still has  some slurred speech and facial droop and some right-sided upper and  lower extremity weakness.  She is doing physical therapy three times a  week.  She has had a wonderful result with this, however.  She had  paroxysmal atrial fibrillation throughout her hospitalization.  At the  end of hospitalization, she had very reasonably controlled rates when in  atrial fibrillation.  She does not really notice the palpitations.  She  has had no presyncope or syncope.  She is weak.  She denies any chest  pain or shortness of breath.   PAST MEDICAL HISTORY:  1. Paroxysmal atrial fibrillation.  2. Hypothyroidism with goiter.  3. Dyslipidemia.  4. Diverticulosis.  5. Chronic obstructive pulmonary disease.  6. Degenerative joint disease of the lumbar spine.  7. Splenic and hepatic granulomas.  8. Left retinal stroke 18 months ago.  9. Gastroesophageal reflux disease.  10.Hernias.  11.Colonic polyps.  12.Prior right total hip replacement (non-ST-segment elevation      myocardial function postoperatively).  13.Left  hip replacement.  14.Left shoulder surgery.   ALLERGIES:  CIPRO, PREMPRO, DARVOCET, DESMOPRESSIN.   MEDICATIONS:  1. Diltiazem 120 mg daily.  2. Digoxin 250 mcg daily.  3. Aspirin 81 mg daily.  4. Prilosec 20 mg daily.  5. Levothyroxine 50 mcg daily.  6. Cranberry cap.  7. Biotin.  8. Calcium.  9. Vitamin D.  10.Vitamin C.  11.Coumadin.  12.Vytorin 10/40.  13.Tylenol.   REVIEW OF SYSTEMS:  As stated in the HPI, otherwise negative for other  systems.   PHYSICAL EXAMINATION:  GENERAL:  The patient is in no distress.  VITAL SIGNS:  Blood pressure 131/73, heart rate 68 and irregular.  Weight 137 pounds, body mass index 21.  HEENT:  Eyelids unremarkable.  Pupils equal, round, and reactive to  light.  Fundi not visualized.  Oral mucosa unremarkable.  NECK:  No jugular distention at 45 degrees.  Carotid upstrokes brisk and  symmetrical.  No bruits, no thyromegaly.  LYMPHATICS:  No cervical, axillary, or inguinal adenopathy.  LUNGS:  Clear to auscultation bilaterally.  BACK:  No costovertebral angle tenderness.  CHEST:  Normal.  HEART:  PMI not displaced or sustained,  S1-S2 within normal limits.  No  S3, no murmurs.  ABDOMEN:  Flat.  Positive bowel sounds, normal in frequency and pitch.  No bruits, rebound, guarding.  No midline pulsatile masses.  No  hepatomegaly, splenomegaly.  SKIN:  No rashes, no nodules.  EXTREMITIES:  2+ pulses upper, 2+ left popliteal and posterior tibialis,  1+ right popliteal and posterior tibialis, mild right lower extremity  ankle edema.  NEUROLOGIC:  Cranial nerves grossly intact, except for mild right facial  droop.  Motor intact, except for some mild right upper and lower  weakness.   EKG:  Atrial fibrillation with controlled ventricular rate, axis within  normal limits, intervals within normal limits, nonspecific T wave  changes, possibly digoxin effect.   ASSESSMENT AND PLAN:  1. Atrial fibrillation.  At this point, I am going to reduce  the      digoxin a little bit given her age and her thin body habitus.  She      is going to go to 0.125 daily.  I will have her come back in a      couple of months, and I will probably check a Holter then to make      sure that she has adequate rate control.  She is in paroxysmal      atrial fibrillation, but she is in it fairly frequently.  She will      remain on the Coumadin.  She is now reluctantly committed to this.  2. Hypertension.  Blood pressure is well-controlled.  She will      continue the medications as listed for her atrial fibrillation and      rate control.   FOLLOW UP:  I will see her back in 2 months, as above.     Rollene Rotunda, MD, [facility]  Electronically Signed    JH/MedQ  DD: 02/02/2008  DT: 02/04/2008  Job #: [id number]   cc:   Titus Dubin. Alwyn Ren, MD,FACP,FCCP

## 2011-05-07 NOTE — H&P (Signed)
NAME:  Kristine Simmons, Kristine Simmons               ACCOUNT NO.:  [id number]   MEDICAL RECORD NO.:  [id number]          PATIENT TYPE:  IPS   LOCATION:  NA                           FACILITY:  [facility]   PHYSICIAN:  Ellwood Dense, M.D.   DATE OF BIRTH:  [birth year]   DATE OF ADMISSION:  DATE OF DISCHARGE:                              HISTORY & PHYSICAL   PRIMARY CARE PHYSICIAN:  Lacretia Leigh. Quintella Reichert, M.D.   CARDIOLOGIST:  Rollene Rotunda, M.D., [facility].   GASTROENTEROLOGIST:  Vania Rea. Jarold Motto, M.D., Caleen Essex, FAGA   HISTORY OF PRESENT ILLNESS:  Ms. Kristine Simmons is a 77 year old Caucasian  female with history of prior left retinal stroke approximately 18 months  ago along with paroxysmal atrial fibrillation.  She had a questionable  adverse reaction to Coumadin in the past and had refused to use  Coumadin.   The patient was admitted January 07, 2008, with acute onset of right-  sided weakness along with aphasia and right facial weakness.  BP on  admission was 190/92 and she was treated with IV Cardizem.  CT of the  brain showed no acute changes.  She was treated with TPA along with  mechanical thrombolysis by Dr. Corliss Skains on January 25, 2007.  Followup  CT scan after the procedure showed hypodensity in the left basal  ganglia.  Followup MRI scan on January 08, 2008, showed moderate left  basal ganglia/putamen partially hemorrhagic infarct with mild mass  effect.  She was intubated through January 09, 2008.  She has had atrial  fibrillation after admission and Dr. Dietrich Pates from Cardiology was  consulted.  Digoxin was added for rate control.  A 2-D echocardiogram  showed an ejection fraction of 55-65% with mild mitral valve  regurgitation and mild increased aortic valve thickness.   Coumadin was recommended for cardioembolic stroke.  She has had atrial  flutter and Cardizem was added with improvement in rate control.  Physical, Occupational and Speech Therapy are ongoing with the patient  showing visual  field deficits with ADL tasks requiring min assist to  ambulate 12 feet with loss of balance in a staying position.   The patient was evaluated by the Rehabilitation physicians and felt to  be an appropriate candidate for inpatient rehabilitation.   REVIEW OF SYSTEMS:  Positive for reflex, weakness, anxiety, palpitation  and decreased vision of the left eye.   PAST MEDICAL HISTORY:  1. Paroxysmal atrial fibrillation with prior refusal of Coumadin use.  2. Hypothyroidism with thyroid goiter.  3. Dyslipidemia.  4. Prior right total hip replacement January 2008 with non-ST wave      myocardial infarction postoperatively.  5. Diverticulosis.  6. 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  7. Chronic obstructive pulmonary disease.  8. Degenerative joint disease of the lumbar spine.  9. Splenic and hepatic granulomas.  10.Left retinal stroke 18 months ago.  11.Gastroesophageal reflux disease.  12.Hernias.  13.Hyperplastic colonic polyps.   FAMILY HISTORY:  Positive for stroke, hypertension, diabetes mellitus  and arrhythmias.   SOCIAL HISTORY:  The patient is married and lives with her husband in a  one level home with one step to enter.  She quit tobacco after smoking  for 40 years in 1995.  She reports occasional ethanol use.   FUNCTIONAL HISTORY PRIOR ADMISSION:  Independent.   ALLERGIES:  CIPRO, PREMPRO, HYDROCODONE, PREMARIN, PROVERA.   MEDICATIONS PRIOR TO ADMISSION:  1. Aggrenox daily.  2. Biotin daily.  3. Cranberry extract daily.  4. Levothyroxine 25 mcg daily.  5. Metoprolol XR 25 mg daily.  6. Prilosec 40 mg daily.  7. Vitamin C and vitamin D along with glucosamine daily.  8. Magnesium daily.  9. Vytorin 10/40 one tablet daily.   LABORATORY:  Recent hemoglobin was 11.2, hematocrit 32.5, platelet count  of 162,000, white count 11.4.  Recent sodium was 139, potassium 3.9,  chloride 108, bicarbonate 22, BUN 6, creatinine 0.8, glucose of  119.  Recent total cholesterol was 162 with HDL cholesterol 62, LDL  cholesterol of 80 and triglycerides of 98.  Recent TSH was 1.048.  Recent hemoglobin A1c was 5.8 with a homocystine level of 6.7.  Chest x-  ray January 09, 2008, showed mild bibasilar atelectasis.   PHYSICAL EXAMINATION:  GENERAL:  Well-appearing elderly adult female  seated in the chair, answering questions appropriately with her husband  present.  VITAL SIGNS:  Blood pressure is 128/64, pulse 70, respiratory rate 18,  temperature 97.8.  HEENT: Normocephalic, nontraumatic.  CARDIOVASCULAR:  Irregular rate and rhythm.  S1 and S2 without murmurs.  ABDOMEN: Soft, nontender with positive bowel sounds.  LUNGS:  Clear to auscultation bilaterally.  NEUROLOGIC:  Alert and oriented x3.  Cranial nerve exam showed slight  visual field deficit on the left with ability to read bilaterally.  She  had slight facial weakness on the right compared to the left.  Sensation  was intact to light touch with the bilateral face.  Left upper and left  lower extremity strength were 5/5 throughout.  Bulk and tone were  normal.  Reflexes were 2+ and symmetrical.  Sensation was intact to  light touch with her left upper and left lower extremity.  Right upper  extremity strength was generally 4/5 throughout.  Bulk and tone were  normal.  Reflexes were 2+ and symmetrical.  Sensation was intact to  light touch with the right upper extremity.  Right lower extremity exam  showed 4-4+/5 strength throughout.  Bulk and tone were normal.  Reflexes  were 2+ and symmetrical.  Sensation was intact to light touch with the  right lower extremity.   IMPRESSION:  1. Status post left basal ganglia/corona radiata infarct with partial      hemorrhagic conversion.  2. Right hemiparesis, right facial weakness and mild dysarthria      related to the above-noted stroke.  3. Presently, the patient has deficits in ADLs, transfers, ambulation,      higher level  cognition and more detailed speech in relation to the      above-noted stroke.   PLAN:  1. Admitted to the Rehabilitation Unit for daily therapies to include      physical therapy for range of motion, strengthening, bed mobility,      transfers, pre-gait training, gait training and equipment      __________ .  2. Occupational therapy for range of motion, strengthening, ADLs,      cognitive/perceptual training, splinting and equipment __________ .  3. Rehab nursing for skin care, wound care and bowel and bladder      training as necessary.  4. Speech therapy for oral motor exercises  and evaluation of swallow      as necessary along with higher level cognition.  5. Case management to assess home environment, assist with discharge      planning and arrange for appropriate followup care.  6. Social work to assess family and social support and assist in      discharge planning.  7. Continue regular diet.  8. Check admission labs including CBC and C-Met in the morning on      January 15, 2008.  9. Incentive spirometry every 2 hours while awake.  10.Tylenol 325 mg one to two tabs p.o. q.4h. p.r.n. for pain.  11.Enteric-coated aspirin 81 mg p.o. daily.  12.Lanoxin 0.25 mg p.o. daily.  13.Synthroid 25 mcg p.o. daily.  14.Protonix 40 mg p.o. daily.  15.Coumadin per Pharmacy.  16.Cardizem CD 120 mg p.o. daily.  17.Vytorin 10/40 one tablet p.o. daily.   PROGNOSIS:  Good.   ESTIMATED LENGTH OF STAY:  Seven to ten days.   GOALS:  __________  ADLs, transfers and ambulation.           ______________________________  Ellwood Dense, M.D.     DC/MEDQ  D:  01/14/2008  T:  01/14/2008  Job:  [id number]

## 2011-05-07 NOTE — Consult Note (Signed)
NAME:  Kristine Simmons, Kristine Simmons               ACCOUNT NO.:  [id number]   MEDICAL RECORD NO.:  [id number]          PATIENT TYPE:  INP   LOCATION:  3107                         FACILITY:  [facility]   PHYSICIAN:  Gerrit Friends. Dietrich Pates, MD, FACCDATE OF BIRTH:  1927-07-17   DATE OF CONSULTATION:  01/08/2008  DATE OF DISCHARGE:                                 CONSULTATION   REFERRING PHYSICIAN:  Dr. Pearlean Brownie.   PRIMARY CARE PHYSICIAN:  Dr. Alwyn Ren.   PRIMARY CARDIOLOGIST:  Dr. Antoine Poche.   HISTORY OF PRESENT ILLNESS:  A 77 year old woman with paroxysmal atrial  fibrillation admitted with stroke; consultation now requested after  treatment with t-PA.  The patient presented with fairly mild symptoms  including right-sided facial numbness, right-sided weakness and slurred  speech.  After transport to the emergency department and initial  evaluation, she was treated with t-PA.  She subsequently developed  hypotension and required intubation.  She has now stabilized on  norepinephrine and is awake and communicative.  She came to the hospital  in sinus rhythm, but has subsequently converted to atrial fibrillation,  intermittently with rapid rates up to approximately 150.   Kristine Simmons has experienced some sort of adverse reaction to warfarin in  the past and has refused to take it despite the recommendations of her  cardiologist.  She suffered an elevation of cardiac markers in the past  in association with atrial fibrillation that was considered to represent  a non-Q myocardial infarction.  She has a history of dyslipidemia,  hypothyroidism and diverticular disease.  She has nonobstructive  cerebrovascular disease with a lesion of the right carotid.  She has DJD  and underwent right hip surgery in January of this year.  During that  hospitalization, we assisted in her care and management of  intermittently noted atrial fibrillation.   ADMISSION MEDICATIONS:  1. Medications prior to admission included multiple  nutraceuticals.  2. Aspirin.  3. Estrace cream.  4. Metoprolol 25 mg b.i.d.,  5. Prilosec 20 mg daily.  6. Levothyroxine dose unknown.  7. Vytorin 10/40 mg daily.  8. Fosamax.   ALLERGIES:  She reports allergies to CIPROFLOXACIN, HYDROCODONE,  PREMPRO.   PAST MEDICAL HISTORY:  Hypothyroidism, diverticular disease,  osteoporosis.   SOCIAL HISTORY:  Married and lives locally; history of cigarette smoking  that was discontinued 12 years ago.  No excessive use of alcohol.   FAMILY HISTORY:  CVA and diabetes in her family.  There are also  siblings with atrial arrhythmias.   REVIEW OF SYSTEMS:  Unobtainable.   PHYSICAL EXAMINATION:  GENERAL:  On exam, responsive woman who follows  simple commands and is in no acute distress.  VITAL SIGNS:  The heart rate is 120 and irregular, respirations 15,  blood pressure 107/60 on moderate dose norepinephrine.  O2 saturation  100% on ventilator.  Afebrile.  HEENT:  EOMs appear full; oral mucosa could not be examined.  NECK:  No jugular venous distention; cannot appreciate carotid bruits.  CARDIAC:  Normal first and second heart sounds; modest systolic ejection  murmur; normal PMI.  ABDOMEN:  Decreased bowel sounds; soft and  nontender; no masses; no  organomegaly.  LUNGS:  Grossly clear laterally.  EXTREMITIES:  No edema; distal pulses are intact.  NEUROMUSCULAR:  Moves all extremities; no cranial nerve abnormalities  appreciated.   EKG:  Initially sinus bradycardia with first degree AV block, left  atrial abnormality.  No subsequent tracing, but telemetry shows atrial  fibrillation with a rapid ventricular response.   Other laboratory notable for normal CBC and a normal chemistry profile.  Initial cardiac markers are normal.  Lipids are good.   IMPRESSION:  Kristine Simmons continues to have paroxysmal atrial  fibrillation.  Unfortunately, she developed a CVA, although certainly a  cardioembolic event.  She appears to have improved  substantially with t-  PA therapy.  She is hypotensive for unclear reasons.  CCM plans a  central line to further evaluate her volume status.  We will rule out  myocardial infarction, recheck thyroid function studies and attempt to  control her heart rate with digoxin.  Her atrial fibrillation is  probably not contributing substantially to her hypotension.   We appreciate the request for consultation.  We will be happy to follow  this nice woman with you.  Hopefully, she can be fully anticoagulated as  an outpatient in the future.      Gerrit Friends. Dietrich Pates, MD, [facility]  Electronically Signed     RMR/MEDQ  D:  01/08/2008  T:  01/08/2008  Job:  [id number]

## 2011-05-07 NOTE — Discharge Summary (Signed)
NAME:  Kristine Simmons, Kristine Simmons               ACCOUNT NO.:  [id number]   MEDICAL RECORD NO.:  [id number]          PATIENT TYPE:  IPS   LOCATION:  4148                         FACILITY:  [facility]   PHYSICIAN:  Ranelle Oyster, M.D.DATE OF BIRTH:  11/20/1927   DATE OF ADMISSION:  01/14/2008  DATE OF DISCHARGE:  01/21/2008                               DISCHARGE SUMMARY   DISCHARGE DIAGNOSES:  1. Left basal ganglia corona radiata infarct.  2. Paroxysmal atrial fibrillation.  3. Hypothyroid.  4. Dyslipidemia.   HISTORY OF PRESENT ILLNESS:  Kristine Simmons is a 77 year old female with a  history of left retinal stroke 18 months ago.  Question adverse reaction  to Coumadin and has refused it in the past.  She was admitted January 15  with acute-onset right-sided weakness with aphasia and right facial  weakness.  BP noted to be 190/92 at admission.  The patient was treated  with IV Cardene.  CT of head done showed no acute changes.  She was  treated with due 2/3-dose t-PA and mechanical thrombolysis by Dr.  Corliss Skains on day of admission.  CT past procedure showed a hypodensity  in the left basal ganglia.  Follow-up MRI showed moderate left basal  ganglia and putamen largely hemorrhagic infarct with mild mass effect.  The patient was noted to have atrial fibrillation past admission.  Dr.  Dietrich Pates was consulted and digoxin was added for rate control.  A 2-D  echo done showed EF of 55-65% with mild mitral valve regurgitation, mild  increase in aortic valve thickness.  Coumadin was recommended for  cardioembolic stroke and the patient has agreed to this.  Currently PT,  OT and speech therapies are ongoing.  The patient is noted to have  issues with visual deficits with ADL tasks, min assist to ambulate 12  feet with decreased balance and loss of balance with activity.  Rehab  consulted for further activity.   PAST MEDICAL HISTORY:  1. PAF.  2. Hypothyroid with left thyroid goiter.  3. Dyslipidemia.  4.  Right total hip replacement in January 2008 with NSTMI postop.  5. Diverticulosis.  6. DJD, left shoulder and bilateral hips, as well as DDD of spine.  7. Hepatic and splenic granulomas.  8. GERD.  9. Left retinal stroke.  10.Hyperplastic colon polyp.  11.Hernia.   ALLERGIES:  CIPRO, PREMPRO HYDROCODONE, PREMARIN, PROVERA.   FAMILY HISTORY:  Positive for CVA, diabetes and arrhythmia.   SOCIAL HISTORY:  The patient is married, lives in one with one step at  entry.  Has 40-year tobacco use, quit in 1995.  Uses alcohol  occasionally.  The patient was independent prior to admission.   HOSPITAL COURSE:  Kristine Simmons was admitted to rehab on January 14, 2008, for inpatient therapies to consist of PT/OT daily.  Past admission  she was maintained on Coumadin throughout her stay.  INR at time of  discharge was therapeutic at 2.1, and the patient was discharged on 5 mg  Coumadin Monday to Friday with 2.5 on Saturday and Sunday.  Labs done  past admission revealed sodium 138, potassium  of 3.6, chloride 100, CO2  29, BUN 6, creatinine 0.7, glucose 106.  LFTs within normal limits  except for albumin low at 3.2.  CBC revealed hemoglobin 12.1, hematocrit  35.3 and white count 7.9, platelets of 307.  UA/UC done past admission  showed 15,000 colonies of multiple species.  The patient blood pressures  were monitored on a b.i.d. basis during this stay.  They ranged from  110s to 130s systolic, 60s to 40J diastolic.  Heart rate has been  variable, ranging from 60s to occasional high 90s.  Last weight is at 68  kg.  The patient has been continent of bowel and bladder.  Mood has been  stable.  The patient has been very motivated.  She has progressed along  in therapy with improvement in balance.  She has progressed to being at  independent level for transfers, modified independent for ambulating 100  feet x2 with a rolling walker.  Requires min assist with handheld  assist.  She is able to  navigate six stairs x2 with close supervision.  Her right upper extremity weakness is improving.  She does continue with  a weak grips and some impaired sensation.  She is modified independent  for bathing and dressing skills.  Speech therapy has been working with  her to self-monitor and correct for her dysarthria with using strategies  for articulation at sentence and conversational level.  They have also  been working with oral motor exercises, and the patient is independent  to use these strategies as well as for as able to continue with oral  motor strengthening exercises.  The patient will continue with further  follow-up outpatient PT, OT at [facility] outpatient past discharge.  On  January 21, 2008, the patient is discharged to home.   DISCHARGE MEDICATIONS:  1. Coumadin 5 mg Monday Fridays, 2.5 mg Saturday and Sunday.  2. Lanoxin 0.25 mg a day.  3. Cardizem CD 120 mg a day.  4. Coated aspirin 81 mg a day.  5. Levothyroxine 25 mcg a day.  6. Prilosec OTC one per day.  7. Vytorin 10/40 mg once a day.   DIET:  Heart-healthy diet.   ACTIVITY LEVEL:  Intermittent supervision.  No alcohol, no smoking, no  driving.   Follow up with Dr. Antoine Poche and Dr. Alwyn Ren in 2 weeks for routine check.  Follow up Coumadin Clinic at [facility] on February 2 at 4:15.  follow up with Dr. Riley Kill February 27 at 10:10.      Greg Cutter, P.A.      Ranelle Oyster, M.D.  Electronically Signed    PP/MEDQ  D:  02/09/2008  T:  02/10/2008  Job:  [id number]   cc:   Dr. Tracey Harries, MD, [facility]  Pramod P. Pearlean Brownie, MD

## 2011-05-10 NOTE — Op Note (Signed)
NAME:  Kristine Simmons, Kristine Simmons               ACCOUNT NO.:  [id number]   MEDICAL RECORD NO.:  [id number]          PATIENT TYPE:  INP   LOCATION:  2550                         FACILITY:  [facility]   PHYSICIAN:  Claude Manges. Whitfield, M.D.DATE OF BIRTH:  [birth year]   DATE OF PROCEDURE:  01/06/2007  DATE OF DISCHARGE:                               OPERATIVE REPORT   PREOPERATIVE DIAGNOSIS:  Osteoarthritis, right hip.   POSTOPERATIVE DIAGNOSIS:  Osteoarthritis, right hip.   PROCEDURE:  Right total hip replacement.   SURGEON:  Claude Manges. Cleophas Dunker, M.D.   ASSISTANT:  Dr. Chaney Malling and Arnoldo Morale, [facility]   ANESTHESIA:  General orotracheal.   COMPLICATIONS:  None.   COMPONENTS:  DePuy AML 15 mm large femoral component, a 36 mm hip ball  with a 1.5 mm neck length, 54 mm outer diameter, 100 series acetabular  press fit cup with a +4 acetabular liner with a 10 degrees posterior  lip, Apex hole eliminator.   PROCEDURE:  The patient comfortable on the operating table and under  general orotracheal anesthesia, nursing staff inserted a Foley catheter.  The patient was then placed in the lateral decubitus position with the  right side up.  The patient was secured to the operating table with the  Innomed hip system.  The right hip was then prepped from iliac crest to  the mid calf with Betadine scrub and DuraPrep.  Sterile draping was  performed.   A routine Southern incision was utilized and via sharp dissection  carried down to subcutaneous tissue.  Small bleeders were Bovie  coagulated.  Self-retaining retractor was inserted, the iliotibial band  was identified and incised along length of the skin incision.  Self-  retaining retractors placed more deeply.  With the hip internally  rotated, the short external rotators were identified and then carefully  incised from their attachment to the greater trochanter posteriorly with  the Bovie.  Tendinous structures were tagged with 0 Ethibond suture.   The  capsule was identified and incised along the femoral neck and head.  There was a clear yellow joint effusion and beefy red synovitis. The  head was then dislocated posteriorly and osteotomized about a  fingerbreadth proximal to the lesser trochanter using the AML calcar  guide.  Head was then delivered from the wound.  It was misshapen.  There large areas of articular cartilage loss.  The acetabulum revealed  diffuse beefy red synovitis and synovectomy was performed.   Retractor was then placed about the femur.  Starter hole was then made  in the piriformis fossa and reaming was performed to 14.5 mm as we had  templated a 15 mm prosthesis preoperatively.  We had good purchase with  the 14.5 reamer.  Rasping was then performed with the sequential  broaches to a 15 mm large stature component.  The calcar cutter was used  to obtain the appropriate calcar angle.   Retractor was then placed about the acetabulum.  We circumferentially  excised the labrum and any loose osteophytes.  Reaming was performed  sequentially to 53 mm to accept a 54 mm acetabulum which  we had  templated preoperatively and which we confirmed intraoperatively.  After  reaming, we trialed a 52 and a 54 component.  The 52 would seat  completely had good rim fit, 54 would not completely seat.   We accordingly impacted a 100 series 54 mm outer diameter acetabular  component with very nice purchase and it would completely seat.  It was  nice and tight.  The trial +4 liner was then inserted and screwed in  place the femoral broach was then inserted followed by the 36 mm hip  ball 1.5 mm neck length.  The entire construct was reduced and through  full range of motion we had perfect stability.  Trial components were  removed.  The joint was copiously irrigated with saline solution.  The  apex hole eliminator was inserted into the acetabulum followed by the  final Marathon +4 polyethylene liner with a 10 degree posterior lip.   We  again irrigated the wound and the femoral canal.  The 15 mm large  stature femoral component was then impacted flush on the calcar.  The  stem was cleaned.  Morse taper neck was cleaned and dried and the 1.5 mm  neck length 36-mm hip ball was then impacted.  The acetabulum was  inspected, was clear of any loose material.  The entire construct was  then reduced and again through full range of motion there was no  instability in either flexion, extension, internal, external rotation.  We felt leg lengths remained symmetrical as they were preoperatively.   Wound was again irrigated with saline solution.  The capsule was closed  anatomically with a #1 Ethibond.  Short external rotators closed with a  similar material.  Wound was again irrigated with saline solution.  The  iliotibial band was closed with running #1 Vicryl, subcu closed in two  layers of 0-0 and 2-0 Vicryl, skin was closed with skin clips.  Sterile  bulky dressing was applied.  The patient was then returned to the post  anesthesia recovery room in satisfactory condition.  There no  complications.      Claude Manges. Cleophas Dunker, M.D.  Electronically Signed     PWW/MEDQ  D:  01/06/2007  T:  01/06/2007  Job:  [id number]

## 2011-05-10 NOTE — Assessment & Plan Note (Signed)
[facility]                            CARDIOLOGY OFFICE NOTE   NAME:Kristine Simmons, Kristine Simmons                      MRN:          [id number]  DATE:02/17/2007                            DOB:          1927/12/09    PRIMARY CARE PHYSICIAN:  Titus Dubin. Alwyn Ren, MD, FACP, FCCP   REASON FOR PRESENTATION:  Evaluate patient with atrial fibrillation.   HISTORY OF PRESENT ILLNESS:  This is the second visit for this patient  who was hospitalized for hip surgery and had an episode of paroxysmal  atrial fibrillation related to this. She had had atrial fibrillation in  the past and had elected to discontinue Coumadin as there has not been  any documented or symptomatic paroxysms over a long period of time. When  she was hospitalized for total hip arthroplasty, she had an episode of  atrial fibrillation with rapid ventricular rate. She actually had a  small non Q-wave myocardial infarction related to this. Her EF was 55%  to 65% by echo at that time. She presents for followup.   Since I saw her, she has had no palpitations. These have been noticeable  when she has done this in the past. She has had no pre-syncope or  syncope. She is getting over some of the fatigue that she was having  after hip surgery. She is working with physical therapy. She has not  been having any chest discomfort, neck discomfort, arm discomfort,  activity-induced nausea or vomiting or excessive diaphoresis. She has  had no PND or orthopnea.   PAST MEDICAL HISTORY:  1. Paroxysmal atrial fibrillation (last episode was post-op).  2. Non Q-wave myocardial infarction (Type 2 related to supply/demand      and her atrial fibrillation).  3. Well preserved ejection fraction.  4. Right hip arthroplasty.  5. Dyslipidemia.  6. Hypothyroidism.  7. Diverticulosis.  8. Degenerative joint disease.  9. Nonobstructive carotid plaque (30 to 59% on the right).   ALLERGIES:  CIPRO, HYDROCODONE AND PREMPRO.   CURRENT MEDICATIONS:  1. Coumadin.  2. Fosamax.  3. Levothyroxine 25 mcg daily.  4. Vytorin 10/40 daily.  5. Prilosec 20 mg daily.  6. Lopressor 25 mg b.i.d.   REVIEW OF SYSTEMS:  As stated in the HPI and otherwise negative for  other systems.   PHYSICAL EXAMINATION:  The patient is in no distress. Blood pressure  89/53, heart rate 52 and regular. Weight is 142 pounds. Body mass index  is 21.  HEENT: Eyes unremarkable. Pupils equal, round, and reactive to light.  Fundi not visualized. Oral mucosa is unremarkable.  NECK: No jugular venous distention at 45 degrees. Carotid upstroke brisk  and symmetrical. No bruits, no thyromegaly.  LYMPHATICS: No cervical, axillary or inguinal adenopathy.  LUNGS: Clear to auscultation bilaterally.  BACK: No costovertebral angle tenderness.  CHEST: Unremarkable.  HEART: PMI not displaced or sustained. S1, S2 within normal limits. No  S3. No S4. No clicks, rubs or murmurs.  ABDOMEN: Flat, positive bowel sounds, normal in frequency and pitch. No  bruits. No rebounds. No guarding. No midline pulsatile mass. No  hepatomegaly, splenomegaly.  SKIN: No rashes, no nodules.  EXTREMITIES: 2+ pulses throughout. No edema, cyanosis or clubbing.  NEURO: Grossly intact throughout.   ASSESSMENT/PLAN:  1. Paroxysmal atrial fibrillation. The patient very much wants to come      off of the Coumadin. She has relatively low risk for embolic      events. We discussed this at great lengths. She accepts the risk,      the future atrial fibrillation and the risk of stroke and accepts      this over the inconvenience and risk of Coumadin. This is not an      unreasonable decision. Therefore, she should stop the Coumadin      today. Though bradycardic with a low blood pressure, she is      certainly tolerating this and has no symptoms related to it. She      will continue on the current dose of metoprolol. She knows to call      me if she has any palpitations,  pre-syncope or syncope or weakness.  2. Carotid stenosis. The patient will have followup in one year. She      will continue risk reduction per Dr. Alwyn Ren.  3. Followup. I will see her back in about six months or sooner if      needed.     Rollene Rotunda, MD, [facility]  Electronically Signed    JH/MedQ  DD: 02/17/2007  DT: 02/17/2007  Job #: [id number]   cc:   Titus Dubin. Alwyn Ren, MD,FACP,FCCP

## 2011-05-10 NOTE — Letter (Signed)
September 12, 2006     Claude Manges. Cleophas Dunker, M.D.  [address] [place], [place] [address]   RE:  Kristine Simmons, Kristine Simmons  MRN:  [id number]  /  DOB:  [birth year]   Dear Theron Arista:   Thank you for referring [name] for preoperative evaluation; she was  seen September 12, 2006.   She describes progressive loss of range of motion, specifically decreased  flexion of the right lower extremity.  This is associated with shooting pain  into the knee and a limping gait.   She is a very healthy 77 year old, but, unfortunately, she now is back in  atrial fibrillation.   Problematic is that she is SUBJECTIVELY INTOLERANT TO COUMADIN.  She  provided me with a typed list of Coumadin (warfarin) complaints.  These  vary from aching bones, balance dysfunction, fatigue, tender gums, vision  disturbances, etc..  She makes the statement that I am not willing to take  Coumadin. She did agree to take a full aspirin or 3 baby aspirin a day.  I  have recommended a reevaluation by Dr. Antoine Poche.   On examination she does have a suggestion of a nodule in the left thyroid  hemisphere.  By history, she had a needle biopsy in 2002 of a nodule.  Ultrasound will be scheduled.   We may be forced to focus on rate control rather than rhythm conversion.  If  Jake's opinion on her clearance for the total hip replacement, is that she  would need to be on perioperative Lovenox or heparin certainly with  postoperative coverage, that would be determined by [facility].   Thank you for your excellent care of this sweet lady.    Sincerely,      Titus Dubin. Alwyn Ren, MD,FACP,FCCP   [facility]/MedQ  DD:  09/14/2006  DT:  09/16/2006  Job #:  [id number]   CC:    Rollene Rotunda, MD, [facility]

## 2011-05-10 NOTE — Assessment & Plan Note (Signed)
[facility]                            CARDIOLOGY OFFICE NOTE   NAME:Kristine Simmons, Kristine Simmons                      MRN:          [id number]  DATE:01/20/2007                            DOB:          Mar 03, 1927    ADDENDUM:  The patient lifted her sleeve and showed [place] a raised area  where an IV had been.  She has had 1 course of antibiotics already.  It  is a little bit bruised and raised, but Dr. Antoine Poche feels that it will  probably resolve over time.  He did tell her if it gets any worse that  she should see Dr. Alwyn Ren concerning this.  We will also establish her  in our Coumadin clinic and Dr. Antoine Poche told her she could stop her  aspirin.      Jacolyn Reedy, PA-C       Rollene Rotunda, MD, [facility]    ML/MedQ  DD: 01/20/2007  DT: 01/20/2007  Job #: [id number]

## 2011-05-10 NOTE — Assessment & Plan Note (Signed)
[facility]                            CARDIOLOGY OFFICE NOTE   NAME:Kristine Simmons, Kristine Simmons                      MRN:          [id number]  DATE:01/20/2007                            DOB:          1927-08-07    HISTORY OF PRESENT ILLNESS:  This is a pleasant 77 year old married  white female patient of Dr. Antoine Poche who has a history of paroxysmal  atrial fibrillation.  She recently underwent right total hip  arthroplasty by Dr. Cleophas Dunker and postop went into rapid atrial  fibrillation and had a non-ST elevation MI secondary to degenerative  heart failure and atrial fibrillation.  On postop #8, she spontaneously  converted to normal sinus rhythm and she did diurese.  A 2D echo in the  hospital showed ejection fraction of 55-65%, mild MR, LA was mildly  dilated.  Estimated peak pulmonary artery systolic pressure was mild to  moderately increased.  Mild atrium was mildly dilated.  There was small  pericardial effusion and a left pleural effusion.   Since the patient has been home, she has been a little bit sluggish and  weak and very tired.  She denies any chest pain, shortness of breath,  dizziness or presyncope.  Here in the office, she has been in sinus  brady at 43 beats per minute.   CURRENT MEDICATIONS:  1. Diltiazem 120 mg daily.  2. Aspirin 81 mg daily except for Wednesday.  3. Warfarin as directed.  4. Metoprolol 50 mg b.i.d.  5. Digoxin 0.125 mcg daily.  6. Ambien 10 mg one half q.h.s.  7. Fosamax she takes weekly.  8. Levothyroxine 25 mcg daily, one and a half on Wednesdays, Vytorin      10/40 daily.   PHYSICAL EXAMINATION:  GENERAL APPEARANCE:  This is a pleasant 80-year-  old white female in no acute distress, blood pressure 122/58, pulse 43,  weight 149.  There is slight JVD.  No HDR, and she does have bilateral  carotid bruits.  LUNGS:  Decreased breath sounds bilaterally.  HEART:  Regular rate and rhythm at 43 beats per minute.  Normal  S1 and  S2 with a 1/6 systolic murmur at the left sternal border.  ABDOMEN:  Soft without organomegaly, masses, lesions or abnormal  tenderness.  EXTREMITIES:  Without cyanosis or clubbing.  She has trace edema.   STUDIES:  EKG:  Marked sinus bradycardia with first degree AV block at  43 beats per minute and nonspecific ST-T wave changes.   IMPRESSION:  1. Postop rapid atrial fibrillation converted spontaneously to sinus      rhythm, now significant sinus bradycardia.  2. Status post non-ST elevation MI secondary to congestive heart      failure and atrial fibrillation postop.  3. Recent status post right total hip arthroplasty.  4. Dyslipidemia.  5. Hypothyroidism.  6. History of diverticulosis.   PLAN:  We will stop the digoxin and Cardizem and hope the Metoprolol is  enough to help control her heart rate, and she will see Dr. Antoine Poche  back within next month.   ADDENDUM:  The patient lifted her sleeve  and showed [place] a raised area  where an IV had been.  She has had 1 course of antibiotics already.  It  is a little bit bruised and raised, but Dr. Antoine Poche feels that it will  probably resolve over time.  He did tell her if it gets any worse that  she should see Dr. Alwyn Ren concerning this.  We will also establish her  in our Coumadin clinic and Dr. Antoine Poche told her she could stop her  aspirin.      Jacolyn Reedy, PA-C  Electronically Signed      Rollene Rotunda, MD, [facility]  Electronically Signed   ML/MedQ  DD: 01/20/2007  DT: 01/20/2007  Job #: [id number]

## 2011-05-10 NOTE — Assessment & Plan Note (Signed)
[facility]                              CARDIOLOGY OFFICE NOTE   NAME:WITHERSLynanne, Delgreco                      MRN:          [id number]  DATE:09/30/2006                            DOB:          [birth year]    REFERRING PHYSICIAN:  Titus Dubin. Alwyn Ren, MD,FACP,FCCP.   REASON FOR REFERRAL:  Preoperative evaluation in a patient with atrial  fibrillation.   HISTORY OF PRESENT ILLNESS:  The patient is a pleasant 77 year old who I saw  in the office in 2005 for atrial fibrillation.  She was to come back as  needed.  She has had this documented.  She reports that she thinks she was  in A-fib at Dr.  Frederik Pear office recently.  She feels palpitations rarely.  She does not have any symptoms with this.  She does not have any pre-syncope  or syncope.  She gets these, she thinks, when she is more stressed.  She  denies any chest pressure, neck discomfort, or arm discomfort.  She has no  shortness of breath, PND, or orthopnea.  She does exercise routinely and  takes care of her home.  The patient is being considered for right hip  surgery.  She is sent today to talk about atrial fibrillation and Coumadin.  We have had this conversation at length before.  She was of the age where  Coumadin would be recommended.  However, she is on the lower risk side of  this and did not want to take Coumadin.   PAST MEDICAL HISTORY:  Paroxysmal atrial fibrillation, dyslipidemia,  degenerative joint disease, hypothyroidism.   ALLERGIES:  None.   MEDICATIONS:  Prilosec 20 mg q.o.d., Vytorin 10/40, Levothyroxine, Fosamax  70 mg a week, aspirin, calcium, vitamin D, vitamin C, multi-vitamin, Biotin,  cranberry, Omega-3, [place], Perdiem.   SOCIAL HISTORY:  The patient is married.  Her husband has lymphoma.  She is  not smoking cigarettes quitting about 12 years ago after one pack per day  for 40 years.  She drinks alcohol occasionally.   FAMILY HISTORY:  Contributory for father  having a stroke and diabetes.  She  has a brother with arrhythmia.  Otherwise, there is no early heart disease.   REVIEW OF SYMPTOMS:  As stated in the HPI, otherwise, negative for other  systems.   PHYSICAL EXAMINATION:  GENERAL:  The patient is in no distress.  VITAL SIGNS:  Blood pressure 108/66, heart rate 68 and regular, weight 152  pounds, body mass index 23.  HEENT:  Eyes unremarkable.  Pupils equal, round, reactive to light.  Fundi  within normal limits.  Oral mucosa unremarkable.  NECK:  No jugular venous distention at 45 degrees, carotid upstroke brisk  and symmetric, no bruits, no thyromegaly.  LYMPHATICS:  No cervical, axillary, or inguinal adenopathy.  LUNGS:  Clear to auscultation bilaterally.  BACK:  No costovertebral angle tenderness.  CHEST:  Unremarkable.  HEART:  PMI not displaced or sustained.  S1 and S2 within normal limits.  No  S3, no S4, no murmurs.  ABDOMEN:  Flat, positive bowel sounds, normal in frequency and pitch,  no  bruits, no rebound, no guarding, no midline pulsatile mass, no organomegaly,  no splenomegaly.  SKIN:  No rashes.  EXTREMITIES:  2+ pulses throughout, right femoral bruit, no cyanosis, no  clubbing, no edema.  NEUROLOGICAL:  Oriented to person, place and time.  Cranial nerves 2-12  grossly intact.  Motor grossly intact.   EKG sinus rhythm, rate 68, left atrial enlargement, premature atrial  contractions, nonspecific anterolateral T-wave inversion.   ASSESSMENT AND PLAN:  1. Atrial fibrillation.  The patient would have an indication for Coumadin      given her age.  However, she still probably is relatively low risk and      wants very much to avoid this.  We have had a long discussion about      this again.  I am going to get an electrocardiogram looking for any      high risk features on her echo.  If she does have any evidence of      significant left atrial enlargement or smoke or other findings, we      will have a conversation  about the need for Coumadin.  She would not      need to start this before her orthopedic surgery, though. I would      recommend that she be followed on telemetry after orthopedic surgery as      she has a high likelihood of dysrhythmias that we will need to manage.  2. Preoperative evaluation:  The patient has no chest pain.  She is quite      active despite her hip problems.  She had never stress perfusion study      in 2005.  Therefore, no further cardiovascular testing is suggested.      She would be at acceptable risk for the planned surgery.  3. Dyslipidemia, per Dr. Alwyn Ren.  4. Follow up.  I will see her back based on the results of the      echocardiogram.  I have told her to check in every few years to see if      there is an alternative to Coumadin and to re-discuss      her risk of stroke and the indication for Coumadin.  I will also be      happy to see her in the hospital at the time of surgery.            ______________________________  Rollene Rotunda, MD, [facility]   JH/MedQ  DD:  09/30/2006  DT:  10/01/2006  Job #:  [id number]   cc:   Claude Manges. Cleophas Dunker, M.D.  Titus Dubin. Alwyn Ren, MD,FACP,FCCP

## 2011-05-10 NOTE — Consult Note (Signed)
NAME:  Kristine Simmons, Kristine Simmons               ACCOUNT NO.:  [id number]   MEDICAL RECORD NO.:  [id number]          PATIENT TYPE:  INP   LOCATION:  2550                         FACILITY:  [facility]   PHYSICIAN:  Theron Arista C. Eden Emms, MD, FACCDATE OF BIRTH:  November 21, 1927   DATE OF CONSULTATION:  01/06/2007  DATE OF DISCHARGE:                                 CONSULTATION   [name] is a pleasant 77 year old patient seen by Dr. Antoine Poche on  September 30, 2006, prior to right hip surgery.   The patient has a history of PAF.   She apparently has been intolerant to COUMADIN in the past and will not  take it.   When Dr. Antoine Poche saw her, she was in sinus rhythm.   Postoperatively, we are asked to see her for atrial fibrillation at a  rate of 140.   The patient's primary complaint postoperatively is right hip pain as  expected.   Her hemodynamics are stable, although her blood pressure is somewhat  low, averaging about 100-110 systolic.   Dr. Antoine Poche ordered a 2-D echocardiogram prior to her surgery.  I do  not have these results. She does not have a history of decreased LV  function or coronary artery disease.  She had a nonischemic Myoview in  2005, and he thought, except for her rhythm, she was fairly low risk for  surgery.   Her surgical notes show that the operation was uneventful.  It appears  that she was in sinus rhythm during the surgery.   It is not clear to me when exactly she went into atrial fibrillation,  but it appears that it was in the recovery area.   She was somewhat hypertensive initially at the start of the surgery, but  this came down nicely.  There was no excessive blood loss.   Currently her Review of Systems are remarkable for no significant chest  pain, no shortness of breath.  She is acutely aware with no mental  status changes.   ALLERGIES:  No known drug allergies.   MEDICATIONS PRIOR TO ADMISSION:  1. Prilosec 20 every other day.  2. Vytorin 10/40.  3.  Synthroid.  4. Fosamax 70.  5. Aspirin 1 a day.  6. Pyridium.   SOCIAL HISTORY:  She is married.  Her husband has lymphoma.  She is a  nonsmoker and quit 12 years ago.  She drinks alcohol on occasion.  She  is fairly active despite her right hip problem.   FAMILY HISTORY:  Contributory for stroke and diabetes.  She had a  brother with some sort of atrial arrhythmia.   REVIEW OF SYSTEMS:  Otherwise negative currently.   PHYSICAL EXAMINATION:  GENERAL: She is somewhat pale.  She is in no  distress.  VITAL SIGNS: She is in atrial fibrillation at a rate of 140.  Blood  pressure 110/70.  LUNGS: Clear.  NECK: Carotid are normal.  HEART: There is an S1 and S2 with systolic ejection murmur.  ABDOMEN:  MUSCULOSKELETAL:  Benign. She is status post right hip arthroplasty.  EXTREMITIES: She has compression hose on her lower extremities.  EKG has not been done yet.   IMPRESSION:  As far as I can tell, the patient has paroxysmal atrial  fibrillation with normal left ventricular function and no documented  coronary disease.   I think that she needs to be hydrated some to allow [place] to rate control  her better with calcium blockers.   I will write her for normal saline at 100 mL per hours.   We will start her on a Cardizem drip at 10 mg an hour without bolus to  make sure we do not cause excessive hypotension.  I do not see a preop  EKG in the chart, but the anesthesia notes and Dr. Antoine Poche note  indicate that she was in sinus rhythm at the time of office visit as  well as at the beginning of surgery.  Therefore, we will start  amiodarone with 150 mg bolus at 30 mg per hour.   She is currently hemodynamically stable, but in the postop state with  Dilaudid written for pain, she is at risk for hypotension when we treat  her atrial fibrillation rate.   Anticoagulation post hip will be somewhat problematic.  I am not sure  what the orthopedic doctor and Dr. Antoine Poche had in mind because  she  apparently will not take Coumadin.  She may need a prolonged course of  Lovenox b.i.d. for a month or so for DVT prophylaxis.  Long-term  anticoagulation in regards to her atrial fibrillation will have to be  decided. She may be a candidate for the ROCKET trial or one of our other  anticoagulation trials; however, she would need to be willing to take  Coumadin to be randomized to another antithrombin inhibitor.   We would be happy to follow this patient postoperatively.      [name]. Eden Emms, MD, [facility]  Electronically Signed     PCN/MEDQ  D:  01/06/2007  T:  01/06/2007  Job:  [id number]

## 2011-05-10 NOTE — H&P (Signed)
NAME:  Kristine Simmons, Kristine Simmons               ACCOUNT NO.:  [id number]   MEDICAL RECORD NO.:  [id number]          PATIENT TYPE:  INP   LOCATION:  NA                           FACILITY:  [facility]   PHYSICIAN:  Claude Manges. Whitfield, M.D.DATE OF BIRTH:  05-02-1927   DATE OF ADMISSION:  01/06/2007  DATE OF DISCHARGE:                              HISTORY & PHYSICAL   CHIEF COMPLAINT:  Right hip pain.   HISTORY OF PRESENT ILLNESS:  Kristine Simmons is a 77 year old white female  with bilateral hip pain, right greater than left.  Patient has had  increased right hip pain over the past several months.  Radiation of hip  pain down to the right knee.  Pain is intermittent severe pain described  as sharp, throbbing achy pain.  She does have waking pain, she uses no  assistance with devices.  X-rays of the right hip show large osteophytes  with increased joint space.  Subchondral cyst seen in the femur.   ALLERGIES:  PATIENT HAS NO KNOWN ALLERGIES PER SE, HOWEVER CIPRO CAUSED  HER TO GET A YEAST INFECTION, VICODIN AND DARVOCET CAUSE NAUSEA,  VOMITING AND SHE HAS AN INTOLERANCE TO PREMPRO.   MEDICATIONS:  1. Levothyroxine 25 mcg once daily except on Wednesdays when she takes      one-and-a-half tablets.  2. Prilosec over-the-counter 20 mg one tab q.a.m.  3. Vytorin 10/40 mg q.h.s.  4. Fosamax plus D 70 mg one weekly.  5. Aspirin 81 mg, stopped on December 27, 2006.  6. Calcium magnesium one tab at breakfast and one at dinner.  7. Vitamin C 500 mg one at lunch.  8. Centrum Silver one at lunch.  9. Glucosamine 1500 mg one tablet q.a.m.  10.Biotin 1000 mcg at dinner.  11.Cranberry & Buchu 870 mg breakfast.  12.Black Currant Seed Oil 90 mg one tab two times a day.  13.Omega3 1000 mg one tablet b.i.d.  14.Perdiem one tablet nightly as a stool softener.  15.Tylenol-PM one q.h.s.   PAST MEDICAL HISTORY:  Positive for:  1. History of paroxysmal AFib.  2. Dyslipidemia.  3. Degenerative joint disease bilateral hips  and left shoulder.  4. Hypothyroidism.  5. History of diverticulosis.   PAST SURGICAL HISTORY:  1. Tonsilloadenoidectomy as a child.  2. In 1954, 1957 and 1962, three pregnancies and deliveries.  3. Polyps removed from her vocal cords in 1974.  4. In 1983, appendectomy.  5. In 1995, she had a broken right arm in which she had to be put to      sleep, it sounds as if she had an ORIF.  6. Cataract surgery right eye 2003 and left eye 2003.  7. Laser surgery on right eye 2003, laser surgery on left eye 2004.   Patient denies any complications or blood transfusions with the above  procedures.   SOCIAL HISTORY:  Patient smokes one pack of cigarettes daily and has  done so for 40 years.  She drinks occasionally.  She lives in a one-  story home with her husband.   FAMILY HISTORY:  Patient's father deceased with stroke, mother deceased  with leukemia.  She has a total of 13 siblings, she has one living  sister who has history of breast cancer, two sisters died with cancer,  one with lung and one with ovarian cancer.  She has a living brother who  has history of colon cancer, one brother deceased age 46, after hip  surgery developed complications, questionable pulmonary embolus patient  describes as a hemorrhaging and one sister who died with ALS.   REVIEW OF SYSTEMS:  Patient has history of cataracts, wears glasses.  She has had pleurisy and shortness of breath in the past, but nothing  acute.  History of atrial fib, currently reported being in regular sinus  rhythm.  History of palpitations, skip beat.  History of diverticulosis.  She denies diabetes, she does have hypothyroidism, she bruises easily.  History of cystitis with burning urination and pain with urination, but  nothing acute.  She has occasional headaches.  She denies any anemia,  fevers, flu-like symptoms, chest pain, heart disease other than the  atrial fib and, again, no diabetes.  Otherwise, review of systems  negative  or noncontributory.   PHYSICAL EXAMINATION:  GENERAL:  Patient is a well-developed, well-  nourished thin female who walks with an antalgic gait, patient's mood  and affect are appropriate, she talks easily when I examine her.  VITAL  SIGNS:  Temp is 98.2 degrees Fahrenheit, pulse 64, respiratory rate 16,  blood pressure 110/80.  HEENT:  Head is normocephalic, atraumatic without frontal or maxillary  sinus tenderness to palpation.  Conjunctiva is pink.  Sclera is  nonicteric, PERRLA.  EOMs are intact.  No visible external ear  deformities.  Right TM is pearly and gray, left TM is obscured due to  heavy cerumen.  Nose and nasal septum midline, nasal mucosa pink and  moist without polyps, bucca mucosa pink and moist.  Good dentition,  pharynx without erythema or exudate, tongue and uvula midline.  CARDIAC:  Regular rate and rhythm, no evidence of murmurs, rubs or  gallops noted.  LUNGS:  Clear to auscultation bilaterally, no wheezing, rhonchi or  rales.  ABDOMEN:  Soft, nontender throughout, bowel sounds x4 quadrants.  NECK:  Trachea is midline, no lymphadenopathy, carotids 2+ without  bruits, no tenderness to palpation along the cervical spine, she has  full range of motion of cervical spine.  BACK:  No tenderness to palpation over the thoracic and lumbar spine.  BREASTS/GENITOURINARY/RECTAL EXAM:  All deferred at this time.  NEUROLOGIC:  Patient is alert and oriented x3.  Cranial nerves II-XII  are grossly intact.  EXTREMITIES:  Lower extremities:  5/5 strength against resistance  bilaterally.  Deep tendon reflexes and patella is 2+ bilaterally, ankles  1+ bilaterally.  Patient has good sensation to light touch and toes  throughout.  MUSCULOSKELETAL:  Upper extremities are equal and symmetric in size and  shape.  She has full range of motion of the elbows,  wrists and hands.  Does have some pain with forward flexion of the shoulder, but does have 180 degrees of forward flexion.   Radial pulses are 2+ bilaterally.  Lower extremities:  Left hip has 30 degrees of  external rotation, 20  degrees internal rotation, right hip 15 degrees external rotation,  internal rotation is 10 degrees.  She has full range of motion of both  knees without pain, no effusion, no edema is noted in either knee.  Calves are supple and nontender bilaterally.  Dorsal pedal pulses are 2+  bilaterally.  Lower extremities are non-edematous bilaterally.   X-RAYS:  X-rays of the right hip show large osteophytes with increased  joint space, subchondral cyst in femur.   IMPRESSION:  1. Osteoarthritis bilateral hips, right more symptomatic than left.  2. History of paroxysmal AFib.  3. Dyslipidemia.  4. Degenerative joint disease left shoulder.  5. Hypothyroidism.  6. History of diverticulosis.   PLAN:  The patient is to be admitted to [facility] on January 06, 2007 to undergo right total hip arthroplasty.  Prior to surgery,  patient is to undergo all preoperative labs and testing.  She did  receive clearance from her cardiologist, Dr. Antoine Poche, with [employer] [facility].  She also received clearance from Dr. Marga Melnick, her  primary care physician.      Richardean Canal, P.A.      Claude Manges. Cleophas Dunker, M.D.  Electronically Signed    GC/MEDQ  D:  01/03/2007  T:  01/04/2007  Job:  [id number]

## 2011-05-10 NOTE — Discharge Summary (Signed)
NAME:  Kristine Simmons, Kristine Simmons               ACCOUNT NO.:  [id number]   MEDICAL RECORD NO.:  [id number]          PATIENT TYPE:  INP   LOCATION:  3741                         FACILITY:  [facility]   PHYSICIAN:  Claude Manges. Whitfield, M.D.DATE OF BIRTH:  1927-10-28   DATE OF ADMISSION:  01/06/2007  DATE OF DISCHARGE:                               DISCHARGE SUMMARY   ADMISSION DIAGNOSES:  1. History of proximal atrial fibrillation.  2. Dyslipidemia.  3. Joint disease bilateral hips and left shoulder.  4. Hypothyroidism.  5. History of diverticulosis.   DISCHARGE DIAGNOSES:  1. Status post right total hip arthroplasty.  2. Osteoarthritis, left hip.  3. Left shoulder osteoarthritis.  4. Non-ST-elevation myocardial infarction secondary to congestive      heart failure and atrial fibrillation/atrial flutter.  5. Positive hemoccult with frank blood on blood thinners.  6. Acute blood loss anemia secondary to surgery.  7. Hypokalemia.  8. Left forearm cellulitis treated with Keflex.  9. Dyslipidemia.  10.Hypothyroidism.  11.History of diverticulosis.   ADMITTING DIAGNOSES:  Kristine Simmons is a 77 year old white female with  bilateral hip pain, right greater than left.  The patient has had  increased hip pain over the past several months.  Radiation of pain from  the right hip to right knee.  It is intermittent, severe.  The pain is  described as sharp, throbbing, achy pain.  Positive waking pain.  No  assisted devices.  X-rays of the right hip showed large osteocytes with  decreased joint space.  Subchondral __________  of the femur.   ALLERGIES:  The patient has no known allergies per se; however, Cipro  caused her to get a yeast infection.  Vicodin and Darvocet caused nausea  and vomiting.  She has an intolerance to Prempro.   MEDICATIONS:  1. Levothyroxine 25 mcg 1 daily, except on Wednesdays when she takes 1-      1/2 tablets.  2. Prilosec over-the-counter 20 mg 1 tab p.o. daily.  3. Vytorin  10/40 mg q.h.s.  4. Fosamax plus D 70 mg 1 daily.  5. Aspirin 325 mg daily, stopped on December 27, 2006.  6. Calcium magnesium 1 tab at breakfast and 1 at dinner.  7. Vitamin C 500 mg 1 at lunch.  8. Centrum Silver 1 at lunch.  9. Glucosamine 1500 mg daily in the a.m.  10.Biotin 1000 mcg at dinner.  11.Cranberry __________ 870 mg at breakfast.  12.Black Currant seed oil 90 mg 1 tab b.i.d.  13.Omega-3 1000 mg 1 tablet b.i.d.  14.Perideim 1 tab nightly, a stool softener.  15.Tylenol PM 1 q.h.s.   PAST SURGICAL HISTORY:  The patient was taken to the operating room on  01/06/2007 by Dr. Norlene Campbell assisted by Dr. Rinaldo Ratel and  Arnoldo Morale, P.A.C.  The patient was placed under general anesthesia and  then a right total hip replacement was performed following components  used at the __________ large femoral component, 36 hip ball with a 1.5-  mm neck length, 54-mm outer diameter, 100 series acetabulum press thick  cup with a +4 acetabulum with a 10 degree posterior  lip and a hole  eliminator.  The patient tolerated the procedure well and returned to  recovery.  Recovering good and in stable condition.  The patient was  found to be in atrial fibrillation with a heart rate of 140  postoperatively and Cardiology was consulted.  The patient was  hemodynamically stable.  The patient was without chest pain or shortness  of breath.  No known coronary artery disease.  The patient was placed on  a Cardizem drip and Amiodarone.   CONSULTATIONS:  The following consults were obtained while the patient  was hospitalized:  [facility], case manager and PT/OT.   HOSPITAL COURSE:  On postoperative day #1, the patient was with a T-max  of 100.8.  Denied any chest pain or shortness of breath and pain was  mild to moderate.  H&H of 12.0 and 35.7.  White count was 11,100.  The  patient is having runs of fib flutter.  Pulse is 60 to 100.  Cardiac  enzymes on 01/06/2007 was elevated at 293.   CK MB was 7.4.  Relative  index 2.5.  Troponin of 0.75.  On 01/07/2007, CK at 788, CK MB 18.9,  relative index 2.4.  Troponin-I was 0.51.  Third cardiac enzymes on  01/07/2007:  CK was at 996.  CK MB was 17.8.  Relative index 1.8.  Troponin-I was at 0.34.  This patient's H&H remained stable at 11.8 and  35.2.  White count was 13,000.   On exam, the patient was found to have bilateral rales on auscultation  of the lungs.  Chest x-ray was ordered.  BMP was ordered.  Cardiology  was called for possible CHF.  Later that morning, rapid heart rate,  AFib, atrial flutter.  Chest x-ray showed pulmonary edema and a small  pleural effusion.  The patient was with no chest pain or cough.  The  patient was noted to be hypoxic and the rapid response team was called.  The patient was given Lasix IV.  IV fluids were decreased.  ABG was  checked.  BMP was to be followed up.  The repeat cardiac enzymes were  done.  D-dimer was felt to be non-useful post-op.  The patient was  transferred to the Step-Down Unit and as the patient did not clinically  improve, it was felt that chest CT to rule out PE would be deemed  necessary in follow up to the echo recommended.  Later that day, the  patient was breathing more comfortably.  Sat's were 93% on  nonrebreather.  Heart rate was down to 80 to 90 beats per minute with  atrial flutter.  The patient was diuresed with 1200 mL of urine out.  Troponin was trending down.  The 2-D echo is pending.   On post-op day three, the patient was without complaints, unable to wean  off of the rebreather over night.  The patient de-sated down to 87 to  88% on nasal cannula.  The patient's H&H remained stable at 10.9 and  31.6.  Cardiac enzymes were trending down.  BMP was 494.0.  TSH was  checked and was 0.617.   Chest x-ray on the 18th showed resolving edema.  Echo 01/08/2007 showed EF of 55 to 65%, mild LVF, mild mitral regurg, mild dilatation of the  left atrium and right atrium.   Some pericardial effusion, left pleural  effusion.  TEE showed atrial flutter with a variable conduction 80s to  90s.  It was found that the patient had  non-SV elevated MI secondary to  the CHF and atrial flutter.   This patient did develop left forearm cellulitis and was placed on  Keflex.  The patient with post-op anemia.  Iron studies were ordered.   On post-op day 4, the patient with breathing much improved and 95% on  nasal cannula at 6 liters.  The patient mildly hypokalemic at 3.6.  BMP  was 669, up from 494.  The patient is still in the AFib, atrial flutter.  Heart rate of 85 to 100.  Chest x-ray showed decreased edema.   On post-op day 5, the patient continued to improve.  Amiodarone was  discontinued.  The patient's Lopressor was increased.  The patient was  transferred to Telemetry.  Then, the patient agreed to start Coumadin.   On post-op day 6, the patient remained in atrial flutter on Coumadin.  This is still nontherapeutic.  The patient had progressed with PT, some  weakness but this is coming up with PT.  Repeat H&H was checked.   On post-op day 7, the patient's H&H was 7.6 and  34.4.  The patient is  doing well.  Pain is under good control.  The patient is still in  AFib/atrial flutter on Coumadin.  The patient is to undergone a PE a.m.   On post-op day 8, the patient spontaneously converted to a normal sinus  rhythm.  The patient is feeling well without any shortness of breath or  chest pain.  Cardiology at that time felt that she needed to remain in  the hospital to make sure she stayed in normal sinus rhythm and also to  check INR.  The Coumadin was increased to 7.5 mg daily.  INR at this  point, was at 1.3.   On post-op day 9, the patient remained in normal sinus rhythm.  INR was  1.4.  PT was 17.9.  The patient with bright red blood per rectum  reported by the nursing staff on 01/14/2007 in the late afternoon.  Guaiac stool card which was sent was positive.   The second card was  pending.  Cardiology adjusted the patient's medications and adjusted the  Coumadin to 2.5 mg daily, to follow up with Dr. Donette Larry in one week.  From a cardiology standpoint, the patient was ready for discharge.  The  patient's primary care physician, Dr. Alwyn Ren, was called and made aware  of the patient's lower GI bleed and stated that the patient needed to  have a PT/INR drawn on Monday and the patient was to follow up with him.  A copy of the discharge summary to Dr. Sheryn Bison.  Otherwise, the  patient is ready for discharge from his standpoint.  From an orthopedic  standpoint, the patient was progressing well with Physical Therapy and  able to ambulate 50 feet with her own walker with minimal assistance.  The patient has no symptoms of anemia, however, an H&H was to be checked  prior to discharge.  Again, the second guaiac card is pending.  At the time of discharge, the patient was discharged to home in good stable  condition.   LABORATORY DATA:  1. Routine labs on admission, a CBC, all values within normal limits.  2. Coag's on admission, all values were within normal limits.  3. On the day of discharge, INR was 1.4 and the patient's PT was 17.9.  4. Routine chemistries on admission, all values within normal limits.  5. Hepatic enzymes on admission, all values within normal  limits.  6. Cardiac enzymes date 01/06/2007:  CK was 293, 7.4.  The CK MB and      relative index of 2.5.  Troponin was 0.75.  01/07/2007 at 0610      hours:  CK was 788.  CK MB 18.9, relative index 2.4, troponin-I was      0.51.  At 1325 hours, CK was 996, CK MB 17.8, relative index 1.8,      troponin-I was 0.34.  Cardiac enzymes on 01/08/2007 at 1046, CK was      848, CK MB 9.9, relative index 1.2, troponin-I was at 0.15.  At      1825 hours CK was 732, CK MB was 8.2, relative index was 1.1 and      troponin-I was 0.20.  On 01/09/2007 at 0145 hours:  CK was 566, CK      MB was 5.7,  relative index 1.0, troponin-I was at 0.22.  7. Lipid to cholesterol profile dated 01/10/2007:  Triglycerides 105,      HDL is 45, total cholesterol/HDL ratio was 3.2, VLDL was 21, LDL      was 68.  TSH on 01/08/2007 was 0.17.  8. Urinalysis on admission was negative.  9. Urinalysis on 01/11/2007:  15 mg/dL of ketones and protein was at 30      mg/dL, otherwise negative.  10.Chest x-ray dated 01/08/2007 showed emphysema with superimposed      pulmonary edema.  11.Right hip complete 01/06/2007 showed no complications status post      right total hip arthroplasty.   DISCHARGE INSTRUCTIONS:  1. Diet:  No restrictions.  2. Wound care:  The patient is to keep wound clean and dry, change      dressing daily.  Call the office if any sign of infection.  3. Activity:  The patient is at partial weightbearing at 50% on the      right leg with walker.  4. The patient may shower.  5. Special instructions:  TED hose to legs during the day.  6. Followup:  The patient needs to follow up with Dr. Cleophas Dunker in      five days.  The patient is to call office at [phone] for an      appointment.  7. The patient is to follow up with Dr. Alwyn Ren in seven days.  The      patient is to call the office at [phone].  8. The patient needs to follow up with Dr. Donette Larry in one week.  The      patient is to call his office for an appointment.  9. The patient needs Coumadin check on Monday.  The patient's PT/INR      is to be called to Dr. Alwyn Ren.   DISCHARGE MEDICATIONS:  The patient was to resume home medications  except for Darvocet, aspirin 325, Tylenol PM.  The following med's were  added:  1. Lovenox 70 mg 1 sub cu injection twice daily at 6 a.m. and 6 p.m.  2. Aspirin 81 mg 1 p.o. daily.  3. Keflex 250 mg 1 tab p.o. q.i.d. x3 days.  4. Percocet 5/325 one to two tablets every four to six hours for pain.  5. Coumadin 2.5 mg 1 tab daily. 6. Lopressor 50 mg 1 tab twice daily at 10 a.m. and 10 p.m.  7. Maxzide120  mg 1 tab twice daily at 10 a.m. and 10 p.m.  8. Digoxin 0.125 one tab daily at 10 a.m.  9. Ambien 10  mg 1 p.o. q.h.s. p.r.n. insomnia.   CONDITION ON DISCHARGE:  The patient was discharged to home in good,  stable condition.      Richardean Canal, P.A.      Claude Manges. Cleophas Dunker, M.D.  Electronically Signed    GC/MEDQ  D:  01/15/2007  T:  01/15/2007  Job:  [id number]   cc:   Titus Dubin. Alwyn Ren, MD,FACP,FCCP  [name]. Eden Emms, MD, [facility]  Vania Rea. Jarold Motto, MD, [facility], FAGA

## 2011-05-23 ENCOUNTER — Telehealth: Payer: Self-pay | Admitting: Cardiology

## 2011-05-23 NOTE — Telephone Encounter (Signed)
Pt ankles are swelling pt wants to know what she needs to do. Pt will be home after 230pm.

## 2011-05-23 NOTE — Telephone Encounter (Signed)
Pt calling with complaints of swelling bilaterally in feet ankle and legs.  Swelling occurs during the day - worse in the afternoon and does not resolve over night.  Pt reports no weight gain, occasional SOB and a cough that she has had for year while eating.  She states she has tried decrease her NA intake and keeping her feet elevated when possible.  EF normal on last echo.  Pt does have compression stockings that she has not been wearing.  She is encouraged to wear her stockings during the day and remove them at night.  She will continue to watch the NA in her diet and call back early next week if no improvement.

## 2011-05-27 ENCOUNTER — Ambulatory Visit (INDEPENDENT_AMBULATORY_CARE_PROVIDER_SITE_OTHER): Payer: Medicare Other | Admitting: *Deleted

## 2011-05-27 DIAGNOSIS — Z8679 Personal history of other diseases of the circulatory system: Secondary | ICD-10-CM

## 2011-05-27 DIAGNOSIS — I6350 Cerebral infarction due to unspecified occlusion or stenosis of unspecified cerebral artery: Secondary | ICD-10-CM

## 2011-05-27 DIAGNOSIS — I4891 Unspecified atrial fibrillation: Secondary | ICD-10-CM

## 2011-05-27 LAB — POCT INR: INR: 2.9

## 2011-05-28 ENCOUNTER — Encounter: Payer: Medicare Other | Admitting: *Deleted

## 2011-06-11 ENCOUNTER — Telehealth: Payer: Self-pay | Admitting: Cardiology

## 2011-06-11 NOTE — Telephone Encounter (Signed)
Spoke with pt who states her bilateral ankle edema remains despite watching her NA intake, wear compression stocking and keeping feet elevated.  She denies any other complaints and says the swelling doesn't bother her much either but does feel like it needs to be addressed.  appt scheduled for pt 06/25/2011 with Dr Antoine Poche.

## 2011-06-11 NOTE — Telephone Encounter (Signed)
Pt calling re swelling in ankles before 11a of after @ cell [phone]

## 2011-06-24 ENCOUNTER — Encounter: Payer: Medicare Other | Admitting: *Deleted

## 2011-06-24 ENCOUNTER — Encounter: Payer: Self-pay | Admitting: Internal Medicine

## 2011-06-24 ENCOUNTER — Other Ambulatory Visit: Payer: Self-pay

## 2011-06-24 MED ORDER — LORAZEPAM 0.5 MG PO TABS: ORAL_TABLET | ORAL | Status: DC

## 2011-06-24 NOTE — Telephone Encounter (Signed)
RX sent to the pharmacy  

## 2011-06-25 ENCOUNTER — Ambulatory Visit (INDEPENDENT_AMBULATORY_CARE_PROVIDER_SITE_OTHER): Payer: Medicare Other | Admitting: Cardiology

## 2011-06-25 ENCOUNTER — Ambulatory Visit (INDEPENDENT_AMBULATORY_CARE_PROVIDER_SITE_OTHER): Payer: Medicare Other | Admitting: *Deleted

## 2011-06-25 ENCOUNTER — Ambulatory Visit: Payer: Medicare Other | Admitting: Cardiology

## 2011-06-25 ENCOUNTER — Encounter: Payer: Self-pay | Admitting: Cardiology

## 2011-06-25 DIAGNOSIS — I4891 Unspecified atrial fibrillation: Secondary | ICD-10-CM

## 2011-06-25 DIAGNOSIS — Z8679 Personal history of other diseases of the circulatory system: Secondary | ICD-10-CM

## 2011-06-25 DIAGNOSIS — I6350 Cerebral infarction due to unspecified occlusion or stenosis of unspecified cerebral artery: Secondary | ICD-10-CM

## 2011-06-25 DIAGNOSIS — I6529 Occlusion and stenosis of unspecified carotid artery: Secondary | ICD-10-CM

## 2011-06-25 DIAGNOSIS — I10 Essential (primary) hypertension: Secondary | ICD-10-CM

## 2011-06-25 LAB — POCT INR: INR: 2.2

## 2011-06-25 MED ORDER — DILTIAZEM HCL ER 120 MG PO CP24: 120.0000 mg | ORAL_CAPSULE | Freq: Every day | ORAL | Status: DC

## 2011-06-25 NOTE — Progress Notes (Signed)
HPI The patient presents for followup of atrial fibrillation. Since I last saw her her biggest complaint has been lower extremity swelling. She does have some unsteady gait. However, she has had no loss of consciousness. She denies any presyncope or syncope. She denies any chest pressure, neck or arm discomfort. She has no new shortness of breath, PND or orthopnea. She had no weight gain.  Allergies  Allergen Reactions  . Ciprofloxacin     REACTION: vaginal infection  . Desmopressin Acetate     REACTION: swelling feet  . Hydrocodone-Acetaminophen     REACTION: dizzy, N/V  . Levofloxacin     REACTION: INTERFERS WITH COUMADIN  . Prempro     REACTION: MD stopped    Current Outpatient Prescriptions  Medication Sig Dispense Refill  . aspirin 81 MG tablet Take 81 mg by mouth daily.        . bimatoprost (LUMIGAN) 0.03 % ophthalmic solution Place 1 drop into the right eye at bedtime.        . Biotin 10 MG TABS Take 1 tablet by mouth daily.        . brimonidine (ALPHAGAN P) 0.1 % SOLN 1 drop 2 (two) times daily. Right eye       . Calcium-Magnesium-Zinc 1000-400-15 MG TABS Take 1 tablet by mouth 2 (two) times daily.        . Cholecalciferol (VITAMIN D) 2000 UNITS CAPS Take 1 capsule by mouth daily.        . Cranberry (CRANBERRY CONCENTRATE) 500 MG CAPS Take 2 capsules by mouth daily.        . digoxin (LANOXIN) 0.125 MG tablet Take 125 mcg by mouth daily.        Marland Kitchen diltiazem (DILACOR XR) 240 MG 24 hr capsule Take 240 mg by mouth daily.        . diphenhydramine-acetaminophen (TYLENOL PM) 25-500 MG TABS        . ezetimibe-simvastatin (VYTORIN) 10-40 MG per tablet Take 1 tablet by mouth at bedtime.        Marland Kitchen levothyroxine (SYNTHROID, LEVOTHROID) 50 MCG tablet TAKE ONE TABLET BY MOUTH EVERY DAY  90 tablet  1  . LORazepam (ATIVAN) 0.5 MG tablet 1 by mouth as needed only for anxiety  30 tablet  2  . magnesium oxide (MAG-OX) 400 MG tablet Take 400 mg by mouth daily.        . Methen-Hyosc-Meth Blue-Na  Phos (UROGESIC-BLUE PO) Take by mouth daily.        . Multiple Vitamin (MULTIVITAMIN) tablet Take 1 tablet by mouth daily.        . Nutritional Supplements (ENSURE PLUS PO) Take 8 oz by mouth daily.        . timolol (TIMOPTIC) 0.5 % ophthalmic solution Place 1 drop into the left eye daily.        . vitamin C (ASCORBIC ACID) 500 MG tablet        . warfarin (COUMADIN) 5 MG tablet Take by mouth as directed.          Past Medical History  Diagnosis Date  . Hx of colonic polyps     last colonoscopy 2005, repeat was due 2010 Dr. Jarold Motto  . Hyperlipidemia   . Osteoporosis   . Atrial fibrillation   . Cerebrovascular accident 01/09  . Fracture 1995, 2009    RUE; LUE Dr. Cleophas Dunker  . 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 Tonsillectomy and adenoidectomy   . Vocal cord polyps   . Thyroid needle biopsy   . Appendectomy   . Cataract extraction     Bilat  . Colonoscopy w/ polypectomy     Divericulosis 2005, Dr. Jarold Motto    ROS:  As stated in the HPI and negative for all other systems.  PHYSICAL EXAM BP 118/80  Pulse 60  Resp 16  Ht 5\' 7"  (1.702 m)  Wt 124 lb (56.246 kg)  BMI 19.42 kg/m2 GENERAL:  Well appearing HEENT:  Pupils equal round and reactive, fundi not visualized, oral mucosa unremarkable NECK:  No jugular venous distention, waveform within normal limits, carotid upstroke brisk and symmetric, no bruits, no thyromegaly LYMPHATICS:  No cervical, inguinal adenopathy LUNGS:  Clear to auscultation bilaterally BACK:  No CVA tenderness CHEST:  Unremarkable HEART:  PMI not displaced or sustained,S1 and S2 within normal limits, no S3, no S4, no clicks, no rubs, no murmurs, irregular ABD:  Flat, positive bowel sounds normal in frequency in pitch, no bruits, no rebound, no guarding, no midline pulsatile mass, no hepatomegaly, no splenomegaly EXT:  2 plus pulses throughout, mild edema, no cyanosis no clubbing SKIN:  No  rashes no nodules NEURO:  Cranial nerves II through XII grossly intact other than slight facial droop, motor grossly intact throughout PSYCH:  Cognitively intact, oriented to person place and time  EKG:  Atrial fibrillation with a slow ventricular rate, rightward axis, no acute ST-T wave changes.  ASSESSMENT AND PLAN

## 2011-06-25 NOTE — Assessment & Plan Note (Signed)
She has had previous 40-60% stenosis with no recent followup Dopplers. These were being done at the neurology office. I will schedule these in our office.

## 2011-06-25 NOTE — Assessment & Plan Note (Signed)
Her biggest complaint is actually lower extremity swelling. I will decrease her Cardizem to 120 mg daily to see if this helps. In 2 weeks I will get a 24-hour monitor to make sure she has adequate rate control.

## 2011-06-25 NOTE — Patient Instructions (Signed)
Decrease Cardizem to 120 mg a day. Continue all other medications as listed. You will be scheduled for a carotid doppler and a 24 hour holter monitor on the same day Follow up with Dr Antoine Poche in 2 months

## 2011-06-25 NOTE — Assessment & Plan Note (Signed)
She will watch her blood pressure on the reduced dose of Cardizem. It is currently well controlled.

## 2011-07-10 ENCOUNTER — Encounter (INDEPENDENT_AMBULATORY_CARE_PROVIDER_SITE_OTHER): Payer: Medicare Other | Admitting: Cardiology

## 2011-07-10 ENCOUNTER — Encounter (INDEPENDENT_AMBULATORY_CARE_PROVIDER_SITE_OTHER): Payer: Medicare Other

## 2011-07-10 DIAGNOSIS — I4891 Unspecified atrial fibrillation: Secondary | ICD-10-CM

## 2011-07-10 DIAGNOSIS — I6529 Occlusion and stenosis of unspecified carotid artery: Secondary | ICD-10-CM

## 2011-07-10 DIAGNOSIS — R269 Unspecified abnormalities of gait and mobility: Secondary | ICD-10-CM

## 2011-07-11 ENCOUNTER — Encounter: Payer: Medicare Other | Admitting: Cardiology

## 2011-07-15 ENCOUNTER — Encounter: Payer: Self-pay | Admitting: Cardiology

## 2011-07-23 ENCOUNTER — Ambulatory Visit (INDEPENDENT_AMBULATORY_CARE_PROVIDER_SITE_OTHER): Payer: Medicare Other | Admitting: *Deleted

## 2011-07-23 DIAGNOSIS — I4891 Unspecified atrial fibrillation: Secondary | ICD-10-CM

## 2011-07-23 DIAGNOSIS — Z8679 Personal history of other diseases of the circulatory system: Secondary | ICD-10-CM

## 2011-07-23 DIAGNOSIS — I6350 Cerebral infarction due to unspecified occlusion or stenosis of unspecified cerebral artery: Secondary | ICD-10-CM

## 2011-07-23 LAB — POCT INR: INR: 1.8

## 2011-08-20 ENCOUNTER — Ambulatory Visit (INDEPENDENT_AMBULATORY_CARE_PROVIDER_SITE_OTHER): Payer: Medicare Other | Admitting: *Deleted

## 2011-08-20 DIAGNOSIS — I4891 Unspecified atrial fibrillation: Secondary | ICD-10-CM

## 2011-08-20 DIAGNOSIS — Z8679 Personal history of other diseases of the circulatory system: Secondary | ICD-10-CM

## 2011-08-20 DIAGNOSIS — I6350 Cerebral infarction due to unspecified occlusion or stenosis of unspecified cerebral artery: Secondary | ICD-10-CM

## 2011-08-20 LAB — POCT INR: INR: 2.5

## 2011-08-27 ENCOUNTER — Encounter: Payer: Self-pay | Admitting: Cardiology

## 2011-08-27 ENCOUNTER — Ambulatory Visit (INDEPENDENT_AMBULATORY_CARE_PROVIDER_SITE_OTHER): Payer: Medicare Other | Admitting: Cardiology

## 2011-08-27 DIAGNOSIS — I4891 Unspecified atrial fibrillation: Secondary | ICD-10-CM

## 2011-08-27 DIAGNOSIS — R5383 Other fatigue: Secondary | ICD-10-CM

## 2011-08-27 DIAGNOSIS — R296 Repeated falls: Secondary | ICD-10-CM

## 2011-08-27 DIAGNOSIS — R5381 Other malaise: Secondary | ICD-10-CM

## 2011-08-27 DIAGNOSIS — Z9181 History of falling: Secondary | ICD-10-CM

## 2011-08-27 LAB — BASIC METABOLIC PANEL
CO2: 34 mEq/L — ABNORMAL HIGH (ref 19–32)
Chloride: 100 mEq/L (ref 96–112)
Creatinine, Ser: 0.7 mg/dL (ref 0.4–1.2)
Potassium: 4.3 mEq/L (ref 3.5–5.1)
Sodium: 138 mEq/L (ref 135–145)

## 2011-08-27 LAB — CBC WITH DIFFERENTIAL/PLATELET
Basophils Relative: 0.8 % (ref 0.0–3.0)
Eosinophils Relative: 1 % (ref 0.0–5.0)
Hemoglobin: 13.6 g/dL (ref 12.0–15.0)
MCHC: 33 g/dL (ref 30.0–36.0)
MCV: 88.8 fl (ref 78.0–100.0)
Monocytes Absolute: 0.6 10*3/uL (ref 0.1–1.0)
Neutro Abs: 3.3 10*3/uL (ref 1.4–7.7)
Neutrophils Relative %: 54.3 % (ref 43.0–77.0)
RBC: 4.65 Mil/uL (ref 3.87–5.11)
WBC: 6.1 10*3/uL (ref 4.5–10.5)

## 2011-08-27 NOTE — Assessment & Plan Note (Signed)
She does not want a neurology appointment. I will order physical therapy as she is afraid of falls.

## 2011-08-27 NOTE — Progress Notes (Signed)
HPI The patient presents for followup of atrial fibrillation. At the last visit she had increased lower extremity edema.  I reduced her Cardizem to see if this would help. He thinks the swelling is better though she still had some "tightness" in her ankles. Her biggest complaints of fatigue. She also has balance difficulties with an abnormal gait. She has some easy bruising and actually has a bruise on her right elbow with swelling. She is not describing frequent palpitations though these have been rarely. She denies any syncope. She had no new shortness of breath, PND or orthopnea. She said no weight gain or edema. She doesn't sleep particularly well.  Allergies  Allergen Reactions  . Ciprofloxacin     REACTION: vaginal infection  . Desmopressin Acetate     REACTION: swelling feet  . Hydrocodone-Acetaminophen     REACTION: dizzy, N/V  . Levofloxacin     REACTION: INTERFERS WITH COUMADIN  . Prempro     REACTION: MD stopped    Current Outpatient Prescriptions  Medication Sig Dispense Refill  . aspirin 81 MG tablet Take 81 mg by mouth daily.        . bimatoprost (LUMIGAN) 0.03 % ophthalmic solution Place 1 drop into the right eye at bedtime.        . Biotin 10 MG TABS Take 1 tablet by mouth daily.        . brimonidine (ALPHAGAN P) 0.1 % SOLN 1 drop 2 (two) times daily. Right eye       . Calcium-Magnesium-Zinc 1000-400-15 MG TABS Take 1 tablet by mouth 2 (two) times daily.        . Cholecalciferol (VITAMIN D) 2000 UNITS CAPS Take 1 capsule by mouth daily.        . Cranberry (CRANBERRY CONCENTRATE) 500 MG CAPS Take 2 capsules by mouth daily.        . digoxin (LANOXIN) 0.125 MG tablet Take 125 mcg by mouth daily.        Marland Kitchen diltiazem (DILACOR XR) 120 MG 24 hr capsule Take 1 capsule (120 mg total) by mouth daily.  30 capsule  11  . diphenhydramine-acetaminophen (TYLENOL PM) 25-500 MG TABS        . estradiol (ESTRACE) 0.1 MG/GM vaginal cream Place 2 g vaginally daily.        Marland Kitchen  ezetimibe-simvastatin (VYTORIN) 10-40 MG per tablet Take 1 tablet by mouth at bedtime.        Marland Kitchen levothyroxine (SYNTHROID, LEVOTHROID) 50 MCG tablet TAKE ONE TABLET BY MOUTH EVERY DAY  90 tablet  1  . LORazepam (ATIVAN) 0.5 MG tablet 1 by mouth as needed only for anxiety  30 tablet  2  . magnesium oxide (MAG-OX) 400 MG tablet Take 400 mg by mouth daily.        . Methen-Hyosc-Meth Blue-Na Phos (UROGESIC-BLUE PO) Take by mouth daily.        . Multiple Vitamin (MULTIVITAMIN) tablet Take 1 tablet by mouth daily.        . nitrofurantoin (MACRODANTIN) 100 MG capsule Take 100 mg by mouth at bedtime.        . Nutritional Supplements (ENSURE PLUS PO) Take 8 oz by mouth daily.        . timolol (TIMOPTIC) 0.5 % ophthalmic solution Place 1 drop into the left eye daily.        . vitamin C (ASCORBIC ACID) 500 MG tablet        . warfarin (COUMADIN) 5 MG tablet Take by mouth  as directed.          Past Medical History  Diagnosis Date  . Hx of colonic polyps     last colonoscopy 2005, repeat was due 2010 Dr. Jarold Motto  . Hyperlipidemia   . Osteoporosis   . Atrial fibrillation   . Cerebrovascular accident 01/09  . Fracture 1995, 2009    RUE; LUE Dr. Cleophas Dunker  . 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 Tonsillectomy and adenoidectomy   . Vocal cord polyps   . Thyroid needle biopsy   . Appendectomy   . Cataract extraction     Bilat  . Colonoscopy w/ polypectomy     Divericulosis 2005, Dr. Jarold Motto    ROS:  As stated in the HPI and negative for all other systems.  PHYSICAL EXAM BP 112/67  Pulse 51  Resp 16  Ht 5\' 8"  (1.727 m)  Wt 125 lb (56.7 kg)  BMI 19.01 kg/m2 GENERAL:  Well appearing HEENT:  Pupils equal round and reactive, fundi not visualized, oral mucosa unremarkable NECK:  No jugular venous distention, waveform within normal limits, carotid upstroke brisk and symmetric, no bruits, no thyromegaly LYMPHATICS:  No cervical,  inguinal adenopathy LUNGS:  Clear to auscultation bilaterally BACK:  No CVA tenderness CHEST:  Unremarkable HEART:  PMI not displaced or sustained,S1 and S2 within normal limits, no S3, no S4, no clicks, no rubs, no murmurs, irregular ABD:  Flat, positive bowel sounds normal in frequency in pitch, no bruits, no rebound, no guarding, no midline pulsatile mass, no hepatomegaly, no splenomegaly EXT:  2 plus pulses throughout, mild edema, no cyanosis no clubbing SKIN:  No rashes no nodules. Bruising right elbow and wrist NEURO:  Cranial nerves II through XII grossly intact other than slight facial droop, motor grossly intact throughout PSYCH:  Cognitively intact, oriented to person place and time   ASSESSMENT AND PLAN

## 2011-08-27 NOTE — Assessment & Plan Note (Signed)
She had mild plaque earlier this year and she will have a followup next year.

## 2011-08-27 NOTE — Assessment & Plan Note (Signed)
I will check a CBC and TSH.

## 2011-08-27 NOTE — Assessment & Plan Note (Signed)
She has had no symptomatic paroxysms of this. She is on Coumadin and I will check her CBC and she has some easy bruising. Her INR last week was excellent. She will also have a Holter because she's had some bradycardia. I might have to back off further on Cardizem.

## 2011-08-27 NOTE — Patient Instructions (Signed)
Please have lab work today.  Continue all medications as listed.  Your physician has recommended that you wear a holter monitor. Holter monitors are medical devices that record the heart's electrical activity. Doctors most often use these monitors to diagnose arrhythmias. Arrhythmias are problems with the speed or rhythm of the heartbeat. The monitor is a small, portable device. You can wear one while you do your normal daily activities. This is usually used to diagnose what is causing palpitations/syncope (passing out).  You are being referred for physical therapy for the evaluation of abnormal gait/falls.  Follow up in 6 months with Dr Antoine Poche.  You will receive a letter in the mail 2 months before you are due.  Please call us when you receive this letter to schedule your follow up appointment.

## 2011-08-29 ENCOUNTER — Other Ambulatory Visit: Payer: Self-pay | Admitting: Cardiology

## 2011-08-29 MED ORDER — WARFARIN SODIUM 5 MG PO TABS: ORAL_TABLET | ORAL | Status: DC

## 2011-08-30 ENCOUNTER — Other Ambulatory Visit: Payer: Self-pay | Admitting: Pharmacist

## 2011-08-30 MED ORDER — WARFARIN SODIUM 5 MG PO TABS: ORAL_TABLET | ORAL | Status: DC

## 2011-09-04 ENCOUNTER — Telehealth: Payer: Self-pay | Admitting: Cardiology

## 2011-09-04 NOTE — Telephone Encounter (Signed)
Pt aware orders were faxed to [facility] for the evaluation of fall prevention and PT.  Also aware Windell Moulding will be contacting her about monitor placement.  Requested pt call back if she doesn't hear from them by tomorrow afternoon.  She states understanding.

## 2011-09-04 NOTE — Telephone Encounter (Addendum)
Pt calling re appt last week, ordered heart monitor, and was referred to PT, hasn't heard from either one, pls call ok to leave a message

## 2011-09-09 ENCOUNTER — Encounter (INDEPENDENT_AMBULATORY_CARE_PROVIDER_SITE_OTHER): Payer: Medicare Other

## 2011-09-09 DIAGNOSIS — I4891 Unspecified atrial fibrillation: Secondary | ICD-10-CM

## 2011-09-11 ENCOUNTER — Telehealth: Payer: Self-pay | Admitting: Internal Medicine

## 2011-09-11 ENCOUNTER — Other Ambulatory Visit: Payer: Self-pay | Admitting: Internal Medicine

## 2011-09-11 ENCOUNTER — Encounter: Payer: Self-pay | Admitting: Internal Medicine

## 2011-09-11 ENCOUNTER — Ambulatory Visit (INDEPENDENT_AMBULATORY_CARE_PROVIDER_SITE_OTHER): Payer: Medicare Other | Admitting: Internal Medicine

## 2011-09-11 DIAGNOSIS — I10 Essential (primary) hypertension: Secondary | ICD-10-CM

## 2011-09-11 DIAGNOSIS — K5790 Diverticulosis of intestine, part unspecified, without perforation or abscess without bleeding: Secondary | ICD-10-CM | POA: Insufficient documentation

## 2011-09-11 DIAGNOSIS — H409 Unspecified glaucoma: Secondary | ICD-10-CM

## 2011-09-11 DIAGNOSIS — E039 Hypothyroidism, unspecified: Secondary | ICD-10-CM

## 2011-09-11 DIAGNOSIS — K5730 Diverticulosis of large intestine without perforation or abscess without bleeding: Secondary | ICD-10-CM

## 2011-09-11 DIAGNOSIS — M810 Age-related osteoporosis without current pathological fracture: Secondary | ICD-10-CM

## 2011-09-11 DIAGNOSIS — E785 Hyperlipidemia, unspecified: Secondary | ICD-10-CM

## 2011-09-11 DIAGNOSIS — Z0000 Encounter for general adult medical examination without abnormal findings: Secondary | ICD-10-CM

## 2011-09-11 DIAGNOSIS — Z8679 Personal history of other diseases of the circulatory system: Secondary | ICD-10-CM

## 2011-09-11 DIAGNOSIS — T887XXA Unspecified adverse effect of drug or medicament, initial encounter: Secondary | ICD-10-CM

## 2011-09-11 DIAGNOSIS — R05 Cough: Secondary | ICD-10-CM

## 2011-09-11 NOTE — Patient Instructions (Addendum)
Please  schedule fasting Labs : Lipids, hepatic panel,  TSH (272.4, 244.9, 995.20) . The Carotid Doppler may help assess thyroid status

## 2011-09-11 NOTE — Progress Notes (Signed)
Subjective:    Patient ID: Kristine Simmons, female    DOB: 1927/12/08, 77 y.o.   MRN: [id number]  HPI Medicare Wellness Visit:  The following psychosocial & medical history were reviewed as required by Medicare.   Social history: caffeine: decaf only , alcohol:  no ,  tobacco use : see below & exercise : 3X/ week for < 30 min.   Home & personal  safety / fall risk: chronic since CVA (Dr Antoine Poche ordered home PT evaluation), activities of daily living: no limitations , seatbelt use : yes , and smoke alarm employment : yes .  Power of Attorney/Living Will status : in place  Vision ( as recorded per Nurse) & Hearing  evaluation :  Last Ophth exam this week for Glaucoma monitor, Dr Lottie Dawson. Significant hearing loss bilaterally to whisper @ 6 ft Orientation :oriented X3 , memory & recall :good, spelling or math testing: good ,and mood & affect : normal . Depression / anxiety:" not happy" Travel history : 16 [place] , immunization status :up to date , transfusion history:  no, and preventive health surveillance ( colonoscopies, BMD , etc as per protocol/ [facility]): yes, Dental care:  Seen 9/18 . Chart reviewed &  Updated. Active issues reviewed & addressed.       Review of Systems COPD Cough:some daily Sputum production:tablespoon on average, but variable Hemoptysis:no Dyspnea (rest/exertional/PND):no Wheezing:no Chest pain, edema, palpitations:AF felt "@ times"; edema improved with elevation Treatment/efficacy: Use of rescue inhaler:no Use of maintenance inhaler:no Smoking:quit 1995 ( smoked 45 up to 1 ppd) Past medical history:Allergies/Asthma:no . Emphysema:yes Family history pulmonary disease: bro COPD     Objective:   Physical Exam Gen.: Thin but healthy and well-nourished in appearance. Alert, appropriate and cooperative throughout exam. Head: Normocephalic without obvious abnormalities Eyes: Ptosis OS > OD Ears: External  ear exam reveals no significant lesions or deformities. Canals  clear .TMs normal. Hearing as noted Nose: External nasal exam reveals no deformity or inflammation. Nasal mucosa are pink and moist. No lesions or exudates noted. No lesions or exudates. Teeth in good repair. Neck: No deformities, masses, or tenderness noted.  Thyroid  ? Nodule on L. Lungs: Normal respiratory effort; chest expands symmetrically. Lungs are clear to auscultation without rales, wheezes, or increased work of breathing.BS decreased Heart: Slow irregular  rhythm. Flow  murmur. Abdomen: Bowel sounds normal; abdomen soft and nontender. No masses, organomegaly or hernias noted. Genitalia: as per Gyn, seen this month   .                                                                                   Musculoskeletal/extremities: No deformity or scoliosis noted of  the thoracic or lumbar spine. No clubbing, cyanosis, edema, or deformity noted. Range of motion  normal .Tone & strength  normal.Joints normal. Nail health  good. Vascular: Carotid, radial artery pulses are full and equal. No bruits present.Deecreased pedal pulses Neurologic: Alert and oriented x3. Deep tendon reflexes symmetrical and normal.Asymmetry of nasolabial fold         Skin: Intact without suspicious lesions or rashes. Lymph: No cervical, axillary  lymphadenopathy present. Psych: Mood and affect are normal. Normally interactive  Assessment & Plan:  #1 Medicare Wellness Exam; criteria met ; data entered #2 Problem List reviewed ; Assessment/ Recommendations made Plan: see Orders

## 2011-09-11 NOTE — Telephone Encounter (Signed)
error 

## 2011-09-12 LAB — URINALYSIS, ROUTINE W REFLEX MICROSCOPIC
Bilirubin Urine: NEGATIVE
Glucose, UA: NEGATIVE
Nitrite: NEGATIVE
Nitrite: NEGATIVE
Protein, ur: NEGATIVE
Specific Gravity, Urine: 1.011
Urobilinogen, UA: 1
pH: 8

## 2011-09-12 LAB — COMPREHENSIVE METABOLIC PANEL
AST: 27
Albumin: 3.2 — ABNORMAL LOW
Albumin: 3.7
Alkaline Phosphatase: 30 — ABNORMAL LOW
BUN: 6
BUN: 9
CO2: 19
CO2: 29
Calcium: 8.7
Calcium: 9.1
Chloride: 100
Chloride: 107
Creatinine, Ser: 0.7
Creatinine, Ser: 0.78
Creatinine, Ser: 0.82
GFR calc Af Amer: >60
GFR calc Af Amer: >60
GFR calc non Af Amer: >60
GFR calc non Af Amer: >60
Potassium: 4.1
Total Bilirubin: 0.9
Total Bilirubin: 1.1

## 2011-09-12 LAB — BLOOD GAS, ARTERIAL
Acid-base deficit: 3.9 — ABNORMAL HIGH
Bicarbonate: 20.7
Drawn by: 275531
FIO2: 1
MECHVT: 550
PEEP: 5
RATE: 12
TCO2: 21.8
TCO2: 22.3
pCO2 arterial: 35.6
pCO2 arterial: 41
pH, Arterial: 7.384
pO2, Arterial: 224 — ABNORMAL HIGH

## 2011-09-12 LAB — PROTIME-INR
INR: 1
INR: 1.1
INR: 1.5
INR: 2.1 — ABNORMAL HIGH
Prothrombin Time: 13.4
Prothrombin Time: 14.6
Prothrombin Time: 15.6 — ABNORMAL HIGH
Prothrombin Time: 18.2 — ABNORMAL HIGH
Prothrombin Time: 20.1 — ABNORMAL HIGH
Prothrombin Time: 24.5 — ABNORMAL HIGH

## 2011-09-12 LAB — CK TOTAL AND CKMB (NOT AT ARMC)
CK, MB: 1.9
CK, MB: 4.5 — ABNORMAL HIGH
Relative Index: INVALID
Total CK: 144
Total CK: 156
Total CK: 42

## 2011-09-12 LAB — CBC
HCT: 32.5 — ABNORMAL LOW
HCT: 35.7 — ABNORMAL LOW
HCT: 36.3
HCT: 37.6
Hemoglobin: 12.5
MCHC: 33.9
MCHC: 34.4
MCV: 91.8
MCV: 91.8
MCV: 91.9
Platelets: 162
Platelets: 206
Platelets: 214
Platelets: 307
RBC: 3.96
RDW: 13.6
WBC: 11.4 — ABNORMAL HIGH
WBC: 7.9
WBC: 8.5
WBC: 8.7
WBC: 9.2

## 2011-09-12 LAB — URINE CULTURE
Colony Count: 15000
Special Requests: NEGATIVE

## 2011-09-12 LAB — TSH
TSH: 0.767
TSH: 1.048

## 2011-09-12 LAB — LIPID PANEL
Cholesterol: 162
HDL: 62
LDL Cholesterol: 80
Triglycerides: 98

## 2011-09-12 LAB — DIFFERENTIAL
Basophils Absolute: 0.1
Lymphocytes Relative: 21
Lymphocytes Relative: 29
Lymphs Abs: 1.6
Lymphs Abs: 2.5
Neutro Abs: 5.1
Neutro Abs: 5.1
Neutrophils Relative %: 60

## 2011-09-12 LAB — HEMOGLOBIN A1C: Mean Plasma Glucose: 129

## 2011-09-12 LAB — TROPONIN I
Troponin I: 0.01
Troponin I: 0.07 — ABNORMAL HIGH

## 2011-09-12 LAB — BASIC METABOLIC PANEL
BUN: 6
Chloride: 108
Creatinine, Ser: 0.8
Glucose, Bld: 119 — ABNORMAL HIGH
Potassium: 3.9

## 2011-09-13 ENCOUNTER — Telehealth: Payer: Self-pay | Admitting: Cardiology

## 2011-09-13 NOTE — Telephone Encounter (Signed)
9/21--results of blood work and carotid u/s results given to pt--carotid results also faxed to dr hopper--nt

## 2011-09-13 NOTE — Telephone Encounter (Signed)
Pt calling re results of carotid and blood test, saw dr hopper this week but he couldn't find it in the computer, he wants to repeat thyroid test but if it was already done she doesn't want to repeat it

## 2011-09-17 ENCOUNTER — Ambulatory Visit (INDEPENDENT_AMBULATORY_CARE_PROVIDER_SITE_OTHER): Payer: Medicare Other | Admitting: *Deleted

## 2011-09-17 DIAGNOSIS — I4891 Unspecified atrial fibrillation: Secondary | ICD-10-CM

## 2011-09-17 DIAGNOSIS — Z8679 Personal history of other diseases of the circulatory system: Secondary | ICD-10-CM

## 2011-09-17 DIAGNOSIS — I6350 Cerebral infarction due to unspecified occlusion or stenosis of unspecified cerebral artery: Secondary | ICD-10-CM

## 2011-09-17 LAB — POCT INR: INR: 2.9

## 2011-09-18 ENCOUNTER — Telehealth: Payer: Self-pay | Admitting: Internal Medicine

## 2011-09-18 ENCOUNTER — Other Ambulatory Visit (INDEPENDENT_AMBULATORY_CARE_PROVIDER_SITE_OTHER): Payer: Medicare Other

## 2011-09-18 DIAGNOSIS — T887XXA Unspecified adverse effect of drug or medicament, initial encounter: Secondary | ICD-10-CM

## 2011-09-18 DIAGNOSIS — E785 Hyperlipidemia, unspecified: Secondary | ICD-10-CM

## 2011-09-18 DIAGNOSIS — E039 Hypothyroidism, unspecified: Secondary | ICD-10-CM

## 2011-09-18 LAB — HEPATIC FUNCTION PANEL
ALT: 25 U/L (ref 0–35)
AST: 33 U/L (ref 0–37)
Albumin: 4.3 g/dL (ref 3.5–5.2)
Alkaline Phosphatase: 41 U/L (ref 39–117)
Total Protein: 7 g/dL (ref 6.0–8.3)

## 2011-09-18 LAB — LIPID PANEL
Cholesterol: 145 mg/dL (ref 0–200)
Triglycerides: 92 mg/dL (ref 0.0–149.0)

## 2011-09-18 MED ORDER — LORAZEPAM 1 MG PO TABS: ORAL_TABLET | ORAL | Status: DC

## 2011-09-18 NOTE — Telephone Encounter (Signed)
Okay, label as one half to one every 8-12 hours as needed only. Can be habit forming and may affect mental alertness or balance. 1 mg dispense 30

## 2011-09-18 NOTE — Telephone Encounter (Signed)
Dr.Hopper please advise 

## 2011-09-27 ENCOUNTER — Telehealth: Payer: Self-pay | Admitting: Cardiology

## 2011-09-27 NOTE — Telephone Encounter (Signed)
10/5--pt calling wanting to know if she should stay on her diltiazem--states she had blood work and holter monitor , but does not know results--advised to stay on diltiazem, labs were OK and monitor shows sinus rhythm with episodes of bradycardia/PACs --pt states she will refill and continue diltiazem--nt

## 2011-09-27 NOTE — Telephone Encounter (Signed)
Pt calling wanting to know the results of pt wearing heart monitor and pt lab results. Pt wants to know if she needs to refill diltiazem RX according to the results of the two test. Please return pt call to discuss further.

## 2011-10-01 ENCOUNTER — Emergency Department (HOSPITAL_COMMUNITY)
Admission: EM | Admit: 2011-10-01 | Discharge: 2011-10-01 | Disposition: A | Discharge status: Home / Self Care | Payer: Medicare Other | Attending: Emergency Medicine | Admitting: Emergency Medicine

## 2011-10-01 ENCOUNTER — Emergency Department (HOSPITAL_COMMUNITY): Payer: Medicare Other

## 2011-10-01 DIAGNOSIS — Z8673 Personal history of transient ischemic attack (TIA), and cerebral infarction without residual deficits: Secondary | ICD-10-CM | POA: Insufficient documentation

## 2011-10-01 DIAGNOSIS — M79609 Pain in unspecified limb: Secondary | ICD-10-CM

## 2011-10-01 DIAGNOSIS — I4891 Unspecified atrial fibrillation: Secondary | ICD-10-CM | POA: Insufficient documentation

## 2011-10-01 DIAGNOSIS — E039 Hypothyroidism, unspecified: Secondary | ICD-10-CM | POA: Insufficient documentation

## 2011-10-01 DIAGNOSIS — X58XXXA Exposure to other specified factors, initial encounter: Secondary | ICD-10-CM | POA: Insufficient documentation

## 2011-10-01 DIAGNOSIS — Z7901 Long term (current) use of anticoagulants: Secondary | ICD-10-CM | POA: Insufficient documentation

## 2011-10-01 DIAGNOSIS — S8010XA Contusion of unspecified lower leg, initial encounter: Secondary | ICD-10-CM | POA: Insufficient documentation

## 2011-10-01 DIAGNOSIS — M7989 Other specified soft tissue disorders: Secondary | ICD-10-CM

## 2011-10-01 DIAGNOSIS — E7800 Pure hypercholesterolemia, unspecified: Secondary | ICD-10-CM | POA: Insufficient documentation

## 2011-10-01 LAB — BASIC METABOLIC PANEL
CO2: 30 mEq/L (ref 19–32)
Calcium: 9.1 mg/dL (ref 8.4–10.5)
Creatinine, Ser: 0.65 mg/dL (ref 0.50–1.10)
Glucose, Bld: 111 mg/dL — ABNORMAL HIGH (ref 70–99)

## 2011-10-01 LAB — CBC
MCH: 28.7 pg (ref 26.0–34.0)
MCV: 87 fL (ref 78.0–100.0)
Platelets: 187 10*3/uL (ref 150–400)
RBC: 5.08 MIL/uL (ref 3.87–5.11)

## 2011-10-01 LAB — DIFFERENTIAL
Eosinophils Absolute: 0.1 10*3/uL (ref 0.0–0.7)
Eosinophils Relative: 1 % (ref 0–5)
Lymphs Abs: 1.9 10*3/uL (ref 0.7–4.0)
Monocytes Absolute: 0.5 10*3/uL (ref 0.1–1.0)
Monocytes Relative: 9 % (ref 3–12)
Neutrophils Relative %: 52 % (ref 43–77)

## 2011-10-03 ENCOUNTER — Ambulatory Visit (INDEPENDENT_AMBULATORY_CARE_PROVIDER_SITE_OTHER): Payer: Medicare Other | Admitting: Internal Medicine

## 2011-10-03 ENCOUNTER — Encounter: Payer: Self-pay | Admitting: Internal Medicine

## 2011-10-03 ENCOUNTER — Ambulatory Visit: Payer: Medicare Other | Admitting: Internal Medicine

## 2011-10-03 VITALS — BP 114/68 | HR 49 | Temp 97.4°F | Wt 129.2 lb

## 2011-10-03 DIAGNOSIS — S8010XA Contusion of unspecified lower leg, initial encounter: Secondary | ICD-10-CM

## 2011-10-03 NOTE — Progress Notes (Signed)
  Subjective:    Patient ID: Kristine Simmons, female    DOB: 1927/12/21, 77 y.o.   MRN: [id number]  HPI Onset:10/9 as knot RLE Trigger/injury:no Pain, redness, swelling:no ,but  hematoma Constitutional: Fever, chills, sweats, weight change:no Heme: Abnormal bruising or clotting due to coumadin; PT/INR was 2.2 on 10/9. No  lymphadenopathy Treatment/response:ACE wrap , cold compresses & elevation .    Review of Systems she denies chest pain or shortness of breath     Objective:   Physical Exam   She is in no acute distress  Chest is clear to auscultation with no increased work of breathing responding  She is a slow regular rhythm with a grade 1.5 systolic murmur  She has slight edema at the right ankle.  Pedal pulses are decreased throughout  There is a resolving hematoma of the lateral aspect of the right lower extremity. The initial diameter based on an outline was 12 x 10 cm. It is now 8 x 7 cm.  Homans sign is negative        Assessment & Plan:  #1 hematoma, resolving. Therapeutic PT/INR  #2 edema right ankle due to  Ace wrap    Plan: Warm moist compresses 3 times a day and elevation as much as possible.

## 2011-10-03 NOTE — Patient Instructions (Signed)
Use warm moist compresses to 3 times a day to the affected area. 

## 2011-10-11 ENCOUNTER — Encounter: Payer: Self-pay | Admitting: Internal Medicine

## 2011-10-11 NOTE — Telephone Encounter (Signed)
Left message for Kristine Simmons - OK to continue PT

## 2011-10-11 NOTE — Telephone Encounter (Signed)
Kristine Simmons calling stating that she wants pt to continue with PT 1x/wk for one week and 2x/wk for one week.   Pt has huge hematoma on the L side of pt calve that hospital said was due to pt coumadin, this slowed down pt PT.   Please return call to discuss further.

## 2011-10-15 ENCOUNTER — Ambulatory Visit (INDEPENDENT_AMBULATORY_CARE_PROVIDER_SITE_OTHER): Payer: Medicare Other | Admitting: *Deleted

## 2011-10-15 DIAGNOSIS — I6350 Cerebral infarction due to unspecified occlusion or stenosis of unspecified cerebral artery: Secondary | ICD-10-CM

## 2011-10-15 DIAGNOSIS — Z8679 Personal history of other diseases of the circulatory system: Secondary | ICD-10-CM

## 2011-10-15 DIAGNOSIS — I4891 Unspecified atrial fibrillation: Secondary | ICD-10-CM

## 2011-10-15 LAB — POCT INR: INR: 2.1

## 2011-11-20 ENCOUNTER — Other Ambulatory Visit: Payer: Self-pay | Admitting: Internal Medicine

## 2011-11-25 ENCOUNTER — Ambulatory Visit (INDEPENDENT_AMBULATORY_CARE_PROVIDER_SITE_OTHER): Payer: Medicare Other | Admitting: *Deleted

## 2011-11-25 DIAGNOSIS — I6350 Cerebral infarction due to unspecified occlusion or stenosis of unspecified cerebral artery: Secondary | ICD-10-CM

## 2011-11-25 DIAGNOSIS — I4891 Unspecified atrial fibrillation: Secondary | ICD-10-CM

## 2011-11-25 DIAGNOSIS — Z8679 Personal history of other diseases of the circulatory system: Secondary | ICD-10-CM

## 2011-12-24 HISTORY — PX: MOHS SURGERY: SUR867

## 2012-01-06 ENCOUNTER — Ambulatory Visit (INDEPENDENT_AMBULATORY_CARE_PROVIDER_SITE_OTHER): Payer: Medicare Other | Admitting: *Deleted

## 2012-01-06 DIAGNOSIS — I4891 Unspecified atrial fibrillation: Secondary | ICD-10-CM

## 2012-01-06 DIAGNOSIS — Z8679 Personal history of other diseases of the circulatory system: Secondary | ICD-10-CM

## 2012-01-06 DIAGNOSIS — I6350 Cerebral infarction due to unspecified occlusion or stenosis of unspecified cerebral artery: Secondary | ICD-10-CM

## 2012-01-20 ENCOUNTER — Ambulatory Visit (INDEPENDENT_AMBULATORY_CARE_PROVIDER_SITE_OTHER): Payer: Medicare Other | Admitting: Pharmacist

## 2012-01-20 DIAGNOSIS — I4891 Unspecified atrial fibrillation: Secondary | ICD-10-CM

## 2012-01-20 DIAGNOSIS — Z8679 Personal history of other diseases of the circulatory system: Secondary | ICD-10-CM

## 2012-01-20 DIAGNOSIS — I6350 Cerebral infarction due to unspecified occlusion or stenosis of unspecified cerebral artery: Secondary | ICD-10-CM

## 2012-02-10 ENCOUNTER — Telehealth: Payer: Self-pay | Admitting: Internal Medicine

## 2012-02-10 NOTE — Telephone Encounter (Signed)
Patient calling, requesting Audiology referral to [facility].  She has an appointment with them on 02-17-12, and they require a referral.  Their phone is [phone], and fax is [phone].

## 2012-02-10 NOTE — Telephone Encounter (Signed)
Referral placed.

## 2012-02-12 ENCOUNTER — Other Ambulatory Visit: Payer: Self-pay | Admitting: Internal Medicine

## 2012-02-13 NOTE — Telephone Encounter (Signed)
DUPLICATE, responded to electronic request

## 2012-02-17 ENCOUNTER — Ambulatory Visit (INDEPENDENT_AMBULATORY_CARE_PROVIDER_SITE_OTHER): Payer: Medicare Other | Admitting: Pharmacist

## 2012-02-17 DIAGNOSIS — I4891 Unspecified atrial fibrillation: Secondary | ICD-10-CM

## 2012-02-17 DIAGNOSIS — I6350 Cerebral infarction due to unspecified occlusion or stenosis of unspecified cerebral artery: Secondary | ICD-10-CM

## 2012-02-17 DIAGNOSIS — Z8679 Personal history of other diseases of the circulatory system: Secondary | ICD-10-CM

## 2012-02-17 LAB — POCT INR: INR: 2.7

## 2012-02-24 ENCOUNTER — Encounter: Payer: Self-pay | Admitting: Cardiology

## 2012-02-24 ENCOUNTER — Ambulatory Visit (INDEPENDENT_AMBULATORY_CARE_PROVIDER_SITE_OTHER): Payer: Medicare Other | Admitting: Cardiology

## 2012-02-24 VITALS — BP 110/70 | HR 49 | Ht 67.0 in | Wt 132.0 lb

## 2012-02-24 DIAGNOSIS — M79609 Pain in unspecified limb: Secondary | ICD-10-CM

## 2012-02-24 DIAGNOSIS — I4891 Unspecified atrial fibrillation: Secondary | ICD-10-CM

## 2012-02-24 DIAGNOSIS — I739 Peripheral vascular disease, unspecified: Secondary | ICD-10-CM

## 2012-02-24 DIAGNOSIS — M79606 Pain in leg, unspecified: Secondary | ICD-10-CM

## 2012-02-24 MED ORDER — FUROSEMIDE 20 MG PO TABS: 20.0000 mg | ORAL_TABLET | Freq: Every day | ORAL | Status: DC

## 2012-02-24 NOTE — Progress Notes (Signed)
HPI The patient presents for followup of atrial fibrillation.   Since I last saw her she had a hematoma on her right anterior lower extremity. She has had episodes of pain in her right leg is sharp and brief. It seems to migrate from her hip to her knee or ankle. She has tightness in her right greater than left ankles if some of the meds as actually less than previous. She has numbness in all of her toes. She does not describe cramping or symptoms consistent with claudication. She has not had any new weakness. She says that her right leg is always "turned in" since having hip surgery. She rarely notices palpitations. She's not had any presyncope or syncope. She has had no chest pressure, neck or arm discomfort. She has had no new shortness of breath, PND or orthopnea.  Allergies  Allergen Reactions  . Desmopressin Acetate     REACTION: swelling feet  . Hydrocodone-Acetaminophen     REACTION: dizzy, N/V  . Levofloxacin     REACTION: INTERFERS WITH COUMADIN  . Premphase     REACTION: MD stopped  . Ciprofloxacin     REACTION: vaginal infection    Current Outpatient Prescriptions  Medication Sig Dispense Refill  . aspirin 81 MG tablet Take 81 mg by mouth daily.        . bimatoprost (LUMIGAN) 0.03 % ophthalmic solution Place 1 drop into both eyes at bedtime.       . Biotin 10 MG TABS Take 1 tablet by mouth daily.        . Calcium-Magnesium-Zinc 1000-400-15 MG TABS Take 1 tablet by mouth 2 (two) times daily.        . Cholecalciferol (VITAMIN D) 2000 UNITS CAPS Take 1 capsule by mouth daily.        . Cranberry (CRANBERRY CONCENTRATE) 500 MG CAPS Take 2 capsules by mouth daily.        . digoxin (LANOXIN) 0.125 MG tablet Take 125 mcg by mouth daily.        Marland Kitchen diltiazem (DILACOR XR) 120 MG 24 hr capsule Take 1 capsule (120 mg total) by mouth daily.  30 capsule  11  . diphenhydramine-acetaminophen (TYLENOL PM) 25-500 MG TABS        . estradiol (ESTRACE) 0.1 MG/GM vaginal cream Place 2 g  vaginally. Twice weekly      . ezetimibe-simvastatin (VYTORIN) 10-40 MG per tablet Take 1 tablet by mouth at bedtime.        Marland Kitchen levothyroxine (SYNTHROID, LEVOTHROID) 50 MCG tablet TAKE ONE TABLET BY MOUTH EVERY DAY  90 tablet  2  . LORazepam (ATIVAN) 1 MG tablet TAKE 1 TABLET EVERY 8-12 HOURS AS NEEDED. CAN BE HABIT FORMING & MAY AFFECT MENTAL ALERTNESS.  30 tablet  0  . Magnesium Oxide 500 MG TABS Take 1 tablet by mouth daily.        . Multiple Vitamin (MULTIVITAMIN) tablet Take 1 tablet by mouth daily.        . nitrofurantoin (MACRODANTIN) 100 MG capsule Take 100 mg by mouth every other day.       . Nutritional Supplements (ENSURE PLUS PO) Take 8 oz by mouth daily.        . Probiotic Product (MISC INTESTINAL FLORA REGULAT) CAPS Take 1 capsule by mouth daily.        . timolol (TIMOPTIC) 0.5 % ophthalmic solution Place 1 drop into both eyes daily.       . vitamin C (ASCORBIC ACID) 500  MG tablet        . warfarin (COUMADIN) 5 MG tablet Take as directed by Anticoagulation clinic   90 tablet  1  . AMBULATORY NON FORMULARY MEDICATION Suprema: 1 by mouth at breakfast         Past Medical History  Diagnosis Date  . Hx of colonic polyps     last colonoscopy 2005, repeat was due 2010 Dr. Jarold Motto  . Hyperlipidemia   . Osteoporosis   . Atrial fibrillation   . Cerebrovascular accident 01/09  . Fracture 1995, 2009    RUE; LUE Dr. Cleophas Dunker  . 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 Tonsillectomy and adenoidectomy   . Vocal cord polyps   . Thyroid needle biopsy 2002  . Appendectomy   . Cataract extraction     Bilat  . Colonoscopy w/ polypectomy     Divericulosis 2005, Dr. Jarold Motto  . Total hip arthroplasty 2008    CHF & RAF post op   . Total shoulder replacement 2010    Dr Cleophas Dunker    ROS:  As stated in the HPI and negative for all other systems.  PHYSICAL EXAM BP 110/70  Pulse 49  Ht 5\' 7"  (1.702 m)  Wt 132 lb (59.875  kg)  BMI 20.67 kg/m2 GENERAL:  Well appearing HEENT:  Pupils equal round and reactive, fundi not visualized, oral mucosa unremarkable, mild facial droop NECK:  No jugular venous distention, waveform within normal limits, carotid upstroke brisk and symmetric, right bruit vs transmitted murmur, no thyromegaly LYMPHATICS:  No cervical, inguinal adenopathy LUNGS:  Clear to auscultation bilaterally BACK:  No CVA tenderness CHEST:  Unremarkable HEART:  PMI not displaced or sustained,S1 and S2 within normal limits, no S3, no S4, no clicks, no rubs, apical systolic early peaking murmur, regular ABD:  Flat, positive bowel sounds normal in frequency in pitch, no bruits, no rebound, no guarding, no midline pulsatile mass, no hepatomegaly, no splenomegaly EXT:  2 plus pulses upper and left DP/PT, diminished PT/DP right, mild edema, no cyanosis no clubbing SKIN:  No rashes no nodules. Bruising right elbow and wrist NEURO:  Cranial nerves II through XII grossly intact other than slight facial droop, motor grossly intact throughout PSYCH:  Cognitively intact, oriented to person place and time  EKG:  Sinus bradycardia, rate 49, axis within normal limits, QT slightly prolonged, cannot exclude an old anteroseptal infarct, nonspecific inferolateral T wave changes 02/24/2012  ASSESSMENT AND PLAN

## 2012-02-24 NOTE — Assessment & Plan Note (Signed)
The blood pressure is at target. No change in medications is indicated. We will continue with therapeutic lifestyle changes (TLC).  

## 2012-02-24 NOTE — Patient Instructions (Signed)
Please stop your ASA Take Furosemide 20 mg once a day for 2 days. Continue all other medications as listed  Your physician has requested that you have a lower extremity arterial exercise duplex. During this test, exercise and ultrasound are used to evaluate arterial blood flow in the legs. Allow one hour for this exam. There are no restrictions or special instructions.  Follow up with Dr Antoine Poche in 2 months

## 2012-02-24 NOTE — Assessment & Plan Note (Addendum)
She has had paroxysms of this with a CVA. She will continue her Coumadin. Of note she can stop her ASA.

## 2012-02-24 NOTE — Assessment & Plan Note (Signed)
She had mild plaque last year and she will have a followup again this year.Marland Kitchen

## 2012-02-24 NOTE — Assessment & Plan Note (Signed)
She has leg pain as described with some edema. Her pulses are slightly reduced. I'm going to check ABIs. In going to give her 2 days of diuretic to see if this might help. I suspect however this is a neurologic or orthopedic problems.

## 2012-03-06 ENCOUNTER — Encounter (INDEPENDENT_AMBULATORY_CARE_PROVIDER_SITE_OTHER): Payer: Medicare Other

## 2012-03-06 DIAGNOSIS — M79606 Pain in leg, unspecified: Secondary | ICD-10-CM

## 2012-03-06 DIAGNOSIS — I739 Peripheral vascular disease, unspecified: Secondary | ICD-10-CM

## 2012-03-30 ENCOUNTER — Ambulatory Visit (INDEPENDENT_AMBULATORY_CARE_PROVIDER_SITE_OTHER): Payer: Medicare Other | Admitting: Pharmacist

## 2012-03-30 DIAGNOSIS — I4891 Unspecified atrial fibrillation: Secondary | ICD-10-CM

## 2012-03-30 DIAGNOSIS — I6350 Cerebral infarction due to unspecified occlusion or stenosis of unspecified cerebral artery: Secondary | ICD-10-CM

## 2012-03-30 DIAGNOSIS — Z8679 Personal history of other diseases of the circulatory system: Secondary | ICD-10-CM

## 2012-03-30 LAB — POCT INR: INR: 3.1

## 2012-04-07 ENCOUNTER — Ambulatory Visit (INDEPENDENT_AMBULATORY_CARE_PROVIDER_SITE_OTHER): Payer: Medicare Other | Admitting: Internal Medicine

## 2012-04-07 ENCOUNTER — Encounter: Payer: Self-pay | Admitting: Internal Medicine

## 2012-04-07 VITALS — BP 128/70 | HR 52 | Temp 97.5°F | Wt 133.0 lb

## 2012-04-07 DIAGNOSIS — R21 Rash and other nonspecific skin eruption: Secondary | ICD-10-CM

## 2012-04-07 MED ORDER — HYDROCORTISONE 2.5 % EX CREA: TOPICAL_CREAM | Freq: Two times a day (BID) | CUTANEOUS | Status: DC

## 2012-04-07 NOTE — Patient Instructions (Signed)
Poison Ivy Poison ivy is a inflammation of the skin (contact dermatitis) caused by touching the allergens on the leaves of the ivy plant following previous exposure to the plant. The rash usually appears 48 hours after exposure. The rash is usually bumps (papules) or blisters (vesicles) in a linear pattern. Depending on your own sensitivity, the rash may simply cause redness and itching, or it may also progress to blisters which may break open. These must be well cared for to prevent secondary bacterial (germ) infection, followed by scarring. Keep any open areas dry, clean, dressed, and covered with an antibacterial ointment if needed. The eyes may also get puffy. The puffiness is worst in the morning and gets better as the day progresses. This dermatitis usually heals without scarring, within 2 to 3 weeks without treatment. HOME CARE INSTRUCTIONS  Thoroughly wash with soap and water as soon as you have been exposed to poison ivy. You have about one half hour to remove the plant resin before it will cause the rash. This washing will destroy the oil or antigen on the skin that is causing, or will cause, the rash. Be sure to wash under your fingernails as any plant resin there will continue to spread the rash. Do not rub skin vigorously when washing affected area. Poison ivy cannot spread if no oil from the plant remains on your body. A rash that has progressed to weeping sores will not spread the rash unless you have not washed thoroughly. It is also important to wash any clothes you have been wearing as these may carry active allergens. The rash will return if you wear the unwashed clothing, even several days later. Avoidance of the plant in the future is the best measure. Poison ivy plant can be recognized by the number of leaves. Generally, poison ivy has three leaves with flowering branches on a single stem. Diphenhydramine may be purchased over the counter and used as needed for itching. Do not drive with  this medication if it makes you drowsy.Ask your caregiver about medication for children. SEEK MEDICAL CARE IF:  Open sores develop.   Redness spreads beyond area of rash.   You notice purulent (pus-like) discharge.   You have increased pain.   Other signs of infection develop (such as fever).  Document Released: 12/06/2000 Document Revised: 11/28/2011 Document Reviewed: 10/25/2009 [employer] Patient Information 2012 [employer]. 

## 2012-04-07 NOTE — Progress Notes (Signed)
  Subjective:    Patient ID: Kristine Simmons, female    DOB: 18-Oct-1927, 78 y.o.   MRN: [id number]  HPI Acute visit A few days ago developed one bump on the right arm, it started itching and hurting a little bit. She is using hydrocortisone cream without much relief.  Past Medical History  Diagnosis Date  . Hx of colonic polyps     last colonoscopy 2005, repeat was due 2010 Dr. Jarold Motto  . Hyperlipidemia   . Osteoporosis   . Atrial fibrillation   . Cerebrovascular accident 01/09  . Fracture 1995, 2009    RUE; LUE Dr. Cleophas Dunker  . 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 Good medication compliance. In general she feels well.     Objective:   Physical Exam  Constitutional: She appears well-developed and well-nourished. No distress.  Musculoskeletal:       Arms: Skin: She is not diaphoretic.       Assessment & Plan:   Rash, most likely poison ivy. Patient is somehow reluctant to accept the diagnosis however on further questioning, she did some yard work before the onset of the rash. Does not have a zoster distribution. Plan:  Treat with steroids Recommend wash all her clothing. Call if not better See instructions

## 2012-04-13 ENCOUNTER — Other Ambulatory Visit: Payer: Self-pay | Admitting: Internal Medicine

## 2012-04-15 ENCOUNTER — Other Ambulatory Visit: Payer: Self-pay | Admitting: Cardiology

## 2012-04-22 ENCOUNTER — Ambulatory Visit (INDEPENDENT_AMBULATORY_CARE_PROVIDER_SITE_OTHER): Payer: Medicare Other | Admitting: Cardiology

## 2012-04-22 ENCOUNTER — Encounter: Payer: Self-pay | Admitting: Cardiology

## 2012-04-22 VITALS — BP 139/75 | HR 52 | Ht 67.0 in | Wt 133.0 lb

## 2012-04-22 DIAGNOSIS — I6529 Occlusion and stenosis of unspecified carotid artery: Secondary | ICD-10-CM

## 2012-04-22 MED ORDER — DIGOXIN 125 MCG PO TABS: 125.0000 ug | ORAL_TABLET | Freq: Every day | ORAL | Status: DC

## 2012-04-22 NOTE — Progress Notes (Signed)
HPI The patient presents for followup of atrial fibrillation.   At the last visit she was having some leg discomfort. She had a little bit of swelling and I gave her some diuretic. However, her symptoms did not improve with this but they're not quite as bothersome as they were. She did have ABIs which were normal. A Holter monitor demonstrated paroxysmal atrial fibrillation predominant sinus rhythm. She has no new complaints. She doesn't really notice palpitations and she's had no presyncope or syncope. She has had no chest pressure, neck or arm discomfort. She's had no weight gain and continues to have some mild ankle edema.  Allergies  Allergen Reactions  . Conj Estrog-Medroxyprogest Ace     REACTION: MD stopped  . Desmopressin Acetate     REACTION: swelling feet  . Hydrocodone-Acetaminophen     REACTION: dizzy, N/V  . Levofloxacin     REACTION: INTERFERS WITH COUMADIN  . Ciprofloxacin     REACTION: vaginal infection    Current Outpatient Prescriptions  Medication Sig Dispense Refill  . AMBULATORY NON FORMULARY MEDICATION Suprema: 1 by mouth at breakfast       . bimatoprost (LUMIGAN) 0.03 % ophthalmic solution Place 1 drop into both eyes at bedtime.       . Biotin 10 MG TABS Take 1 tablet by mouth daily.        . Calcium-Magnesium-Zinc 1000-400-15 MG TABS Take 1 tablet by mouth 2 (two) times daily.        . Cholecalciferol (VITAMIN D) 2000 UNITS CAPS Take 1 capsule by mouth daily.        . Cranberry (CRANBERRY CONCENTRATE) 500 MG CAPS Take 2 capsules by mouth daily.        . digoxin (LANOXIN) 0.125 MG tablet Take 125 mcg by mouth daily.        Marland Kitchen diltiazem (DILACOR XR) 120 MG 24 hr capsule Take 1 capsule (120 mg total) by mouth daily.  30 capsule  11  . diphenhydramine-acetaminophen (TYLENOL PM) 25-500 MG TABS        . estradiol (ESTRACE) 0.1 MG/GM vaginal cream Place 2 g vaginally. Twice weekly      . hydrocortisone 2.5 % cream Apply topically 2 (two) times daily.  30 g  0  .  levothyroxine (SYNTHROID, LEVOTHROID) 50 MCG tablet TAKE ONE TABLET BY MOUTH EVERY DAY  90 tablet  2  . LORazepam (ATIVAN) 1 MG tablet TAKE 1 TABLET EVERY 8-12 HOURS AS NEEDED. CAN BE HABIT FORMING & MAY AFFECT MENTAL ALERTNESS.  30 tablet  0  . Magnesium Oxide 500 MG TABS Take 1 tablet by mouth daily.        . Multiple Vitamin (MULTIVITAMIN) tablet Take 1 tablet by mouth daily.        . nitrofurantoin (MACRODANTIN) 100 MG capsule Take 100 mg by mouth every other day.       . Nutritional Supplements (ENSURE PLUS PO) Take 8 oz by mouth daily.        . Probiotic Product (MISC INTESTINAL FLORA REGULAT) CAPS Take 1 capsule by mouth daily.        . timolol (TIMOPTIC) 0.5 % ophthalmic solution Place 1 drop into both eyes daily.       . vitamin C (ASCORBIC ACID) 500 MG tablet        . VYTORIN 10-40 MG per tablet TAKE 1 TABLET ONCE DAILY.  90 each  2  . warfarin (COUMADIN) 5 MG tablet Take as directed by Anticoagulation clinic  90 tablet  1    Past Medical History  Diagnosis Date  . Hx of colonic polyps     last colonoscopy 2005, repeat was due 2010 Dr. Jarold Motto  . Hyperlipidemia   . Osteoporosis   . Atrial fibrillation   . Cerebrovascular accident 01/09  . Fracture 1995, 2009    RUE; LUE Dr. Cleophas Dunker  . 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 Tonsillectomy and adenoidectomy   . Vocal cord polyps   . Thyroid needle biopsy 2002  . Appendectomy   . Cataract extraction     Bilat  . Colonoscopy w/ polypectomy     Divericulosis 2005, Dr. Jarold Motto  . Total hip arthroplasty 2008    CHF & RAF post op   . Total shoulder replacement 2010    Dr Cleophas Dunker   ROS:  Left eye blindness and decreased gait.  As stated in the HPI and negative for all other systems.  PHYSICAL EXAM BP 139/75  Pulse 52  Ht 5\' 7"  (1.702 m)  Wt 133 lb (60.328 kg)  BMI 20.83 kg/m2 GENERAL:  Well appearing HEENT: Anisocoria, fundi not visualized, oral  mucosa unremarkable, mild facial droop NECK:  No jugular venous distention, waveform within normal limits, carotid upstroke brisk and symmetric, right bruit vs transmitted murmur, no thyromegaly LUNGS:  Clear to auscultation bilaterally BACK:  No CVA tenderness CHEST:  Unremarkable HEART:  PMI not displaced or sustained,S1 and S2 within normal limits, no S3, no S4, no clicks, no rubs, apical systolic early peaking murmur, regular ABD:  Flat, positive bowel sounds normal in frequency in pitch, no bruits, no rebound, no guarding, no midline pulsatile mass, no hepatomegaly, no splenomegaly EXT:  2 plus pulses upper and left DP/PT, diminished PT/DP right, mild edema, no cyanosis no clubbing NEURO:  Cranial nerves II through XII grossly intact other than slight facial droop, motor grossly intact throughout  ASSESSMENT AND PLAN

## 2012-04-22 NOTE — Assessment & Plan Note (Signed)
She had mild plaque last year and she will have a followup again this year.

## 2012-04-22 NOTE — Assessment & Plan Note (Signed)
She tolerates paroxysmal atrial fibrillation and her anticoagulation. No change in therapy is indicated.

## 2012-04-22 NOTE — Patient Instructions (Signed)
The current medical regimen is effective;  continue present plan and medications.  Follow up in 1 year with Dr Hochrein.  You will receive a letter in the mail 2 months before you are due.  Please call us when you receive this letter to schedule your follow up appointment.  

## 2012-04-24 ENCOUNTER — Other Ambulatory Visit: Payer: Self-pay | Admitting: Cardiology

## 2012-04-24 NOTE — Telephone Encounter (Signed)
Refilled digoxin 

## 2012-05-04 ENCOUNTER — Ambulatory Visit (INDEPENDENT_AMBULATORY_CARE_PROVIDER_SITE_OTHER): Payer: Medicare Other | Admitting: Pharmacist

## 2012-05-04 DIAGNOSIS — I6350 Cerebral infarction due to unspecified occlusion or stenosis of unspecified cerebral artery: Secondary | ICD-10-CM

## 2012-05-04 DIAGNOSIS — I4891 Unspecified atrial fibrillation: Secondary | ICD-10-CM

## 2012-05-04 DIAGNOSIS — Z8679 Personal history of other diseases of the circulatory system: Secondary | ICD-10-CM

## 2012-05-04 LAB — POCT INR: INR: 3.4

## 2012-05-19 ENCOUNTER — Ambulatory Visit (INDEPENDENT_AMBULATORY_CARE_PROVIDER_SITE_OTHER): Payer: Medicare Other | Admitting: *Deleted

## 2012-05-19 DIAGNOSIS — I4891 Unspecified atrial fibrillation: Secondary | ICD-10-CM

## 2012-05-19 DIAGNOSIS — Z8679 Personal history of other diseases of the circulatory system: Secondary | ICD-10-CM

## 2012-05-19 DIAGNOSIS — I6350 Cerebral infarction due to unspecified occlusion or stenosis of unspecified cerebral artery: Secondary | ICD-10-CM

## 2012-06-09 ENCOUNTER — Ambulatory Visit (INDEPENDENT_AMBULATORY_CARE_PROVIDER_SITE_OTHER): Payer: Medicare Other

## 2012-06-09 DIAGNOSIS — Z8679 Personal history of other diseases of the circulatory system: Secondary | ICD-10-CM

## 2012-06-09 DIAGNOSIS — I4891 Unspecified atrial fibrillation: Secondary | ICD-10-CM

## 2012-06-09 DIAGNOSIS — I6350 Cerebral infarction due to unspecified occlusion or stenosis of unspecified cerebral artery: Secondary | ICD-10-CM

## 2012-06-09 LAB — POCT INR: INR: 2.7

## 2012-06-29 ENCOUNTER — Telehealth: Payer: Self-pay | Admitting: Internal Medicine

## 2012-06-29 MED ORDER — LORAZEPAM 1 MG PO TABS: ORAL_TABLET | ORAL | Status: DC

## 2012-06-29 NOTE — Telephone Encounter (Signed)
RX called in .

## 2012-06-29 NOTE — Telephone Encounter (Signed)
Refill: Lorazepam 1mg  tablets. Take 1 tablet every 8-12 hours as needed. Can be habit forming and may affect mental alertness. Qty 30. Last fill 04-14-12

## 2012-07-06 ENCOUNTER — Other Ambulatory Visit: Payer: Self-pay | Admitting: Cardiology

## 2012-07-07 ENCOUNTER — Ambulatory Visit (INDEPENDENT_AMBULATORY_CARE_PROVIDER_SITE_OTHER): Payer: Medicare Other

## 2012-07-07 DIAGNOSIS — I4891 Unspecified atrial fibrillation: Secondary | ICD-10-CM

## 2012-07-07 DIAGNOSIS — I6350 Cerebral infarction due to unspecified occlusion or stenosis of unspecified cerebral artery: Secondary | ICD-10-CM

## 2012-07-07 DIAGNOSIS — Z8679 Personal history of other diseases of the circulatory system: Secondary | ICD-10-CM

## 2012-07-07 NOTE — Telephone Encounter (Signed)
..   Requested Prescriptions   Pending Prescriptions Disp Refills  . CARDIZEM CD 120 MG 24 hr capsule [Pharmacy Med Name: CARDIZEM CD 120 MG CAPSUL] 90 each 3    Sig: TAKE (1) CAPSULE DAILY.

## 2012-07-09 ENCOUNTER — Other Ambulatory Visit: Payer: Self-pay | Admitting: Dermatology

## 2012-08-04 ENCOUNTER — Ambulatory Visit (INDEPENDENT_AMBULATORY_CARE_PROVIDER_SITE_OTHER): Payer: Medicare Other | Admitting: *Deleted

## 2012-08-04 ENCOUNTER — Ambulatory Visit (INDEPENDENT_AMBULATORY_CARE_PROVIDER_SITE_OTHER): Payer: Medicare Other | Admitting: Internal Medicine

## 2012-08-04 ENCOUNTER — Encounter: Payer: Self-pay | Admitting: Internal Medicine

## 2012-08-04 VITALS — BP 148/70 | HR 58 | Temp 97.5°F | Wt 128.6 lb

## 2012-08-04 DIAGNOSIS — Z8679 Personal history of other diseases of the circulatory system: Secondary | ICD-10-CM

## 2012-08-04 DIAGNOSIS — I6350 Cerebral infarction due to unspecified occlusion or stenosis of unspecified cerebral artery: Secondary | ICD-10-CM

## 2012-08-04 DIAGNOSIS — W1809XA Striking against other object with subsequent fall, initial encounter: Secondary | ICD-10-CM

## 2012-08-04 DIAGNOSIS — S0003XA Contusion of scalp, initial encounter: Secondary | ICD-10-CM

## 2012-08-04 DIAGNOSIS — I4891 Unspecified atrial fibrillation: Secondary | ICD-10-CM

## 2012-08-04 DIAGNOSIS — S0083XA Contusion of other part of head, initial encounter: Secondary | ICD-10-CM

## 2012-08-04 DIAGNOSIS — R6884 Jaw pain: Secondary | ICD-10-CM

## 2012-08-04 DIAGNOSIS — W1800XA Striking against unspecified object with subsequent fall, initial encounter: Secondary | ICD-10-CM

## 2012-08-04 NOTE — Progress Notes (Signed)
  Subjective:    Patient ID: Kristine Simmons, female    DOB: July 24, 1927, 78 y.o.   MRN: [id number]  HPI She fell and struck her jaw/neck, R forearm, L shoulder & L knee on the kitchen counter 08/01/12. She had turned and tripped; she denied any cardiac or neuro prodrome prior to the fall. It was not related to change in position such as standing up. She's treated this with ice to control the swelling. There was no loss of consciousness with the injury. There've been no changes in her mental status since the event. She does have residual soreness of the jaw on both sides and tenderness over the anterior neck.  Her PT/INR was 2.6 today at the Coumadin clinic. No changes were made in her dose.    Review of Systems She specifically denies any new change in heart rhythm or rate, headache, limb weakness, numbness or tingling or change in mental status prior to the fall. She denies choking, shortness of breath or chest pain.     Objective:   Physical Exam She is in no acute distress. There is significant ecchymosis over the left anterior chin area with ecchymosis over the anterior  neck & upper chest as well. There is no tenderness to palpation of the teeth. There is tenderness at the area of hematoma over the left chin. Range of motion of the neck is surprisingly good.  Range of motion left upper extremity is normal.. There's pain bruise over the left knee; no effusion or decreased range of motion is present  There is faint bruising over the right forearm without tenderness  She has a regular rhythm with grade 1 systolic murmur; pulse is slow.  Chest is clear although breath sounds are decreased. There is no increased work of breathing.  There is some facial asymmetry in ptosis related to her previous cerebrovascular accident. She is alert and oriented x3           Assessment & Plan:  #1 fall without associated cardiac or neuro prodrome  #2 ecchymosis due to Coumadin therapy; PT/INR is  not excessive  #3 residual jaw pain  Plan: She should treat the areas of swelling and ecchymosis with warm compresses. I recommend evaluation by her Dentist to rule out any significant dental injury

## 2012-08-04 NOTE — Patient Instructions (Addendum)
Use warm moist compresses to 3 times a day to the affected area.  Please try to go on My Chart within the next 24 hours to allow me to release the results directly to you.

## 2012-08-05 ENCOUNTER — Ambulatory Visit: Payer: Medicare Other | Admitting: Internal Medicine

## 2012-08-05 LAB — CBC WITH DIFFERENTIAL/PLATELET
Basophils Relative: 1.3 % (ref 0.0–3.0)
Eosinophils Absolute: 0.1 10*3/uL (ref 0.0–0.7)
Hemoglobin: 14.9 g/dL (ref 12.0–15.0)
MCHC: 32.7 g/dL (ref 30.0–36.0)
MCV: 89.3 fl (ref 78.0–100.0)
Monocytes Absolute: 0.6 10*3/uL (ref 0.1–1.0)
Neutro Abs: 4.5 10*3/uL (ref 1.4–7.7)
RBC: 5.09 Mil/uL (ref 3.87–5.11)
RDW: 13.6 % (ref 11.5–14.6)

## 2012-08-07 ENCOUNTER — Encounter: Payer: Self-pay | Admitting: Internal Medicine

## 2012-08-10 ENCOUNTER — Telehealth: Payer: Self-pay | Admitting: Internal Medicine

## 2012-08-10 MED ORDER — AMOXICILLIN 500 MG PO CAPS: ORAL_CAPSULE | ORAL | Status: DC

## 2012-08-10 NOTE — Telephone Encounter (Signed)
Verify no penicillin allergy( not found on  EMR list) amoxicillin  500 mg 4 pills 30-60 minutes pre-dental procedure. The dentist can prescribe this in the future if he is going to have to do recurrent procedures

## 2012-08-10 NOTE — Telephone Encounter (Signed)
In reference to referral to Dental/Dr. Jeanie Sewer, this patient will see him tomorrow, 08/11/12, for the jaw pain.  Their office states that this is a Pre-Med patient due to previous hip replacement, and asks that Dr. Alwyn Ren start patient on an antibiotic ASAP today.  Please advise.

## 2012-08-10 NOTE — Telephone Encounter (Signed)
Patient aware rx is at the pharmacy.

## 2012-08-12 ENCOUNTER — Other Ambulatory Visit: Payer: Self-pay | Admitting: Internal Medicine

## 2012-08-13 NOTE — Telephone Encounter (Signed)
TSH 244.9 

## 2012-08-20 ENCOUNTER — Telehealth: Payer: Self-pay | Admitting: Internal Medicine

## 2012-08-20 MED ORDER — LORAZEPAM 1 MG PO TABS: ORAL_TABLET | ORAL | Status: DC

## 2012-08-20 NOTE — Telephone Encounter (Signed)
RX called in .

## 2012-08-20 NOTE — Telephone Encounter (Signed)
Refill: Lorazepam 1mg  tablets. Take 1 tablet every 8-12 hours as needed. Can be habit forming & may affect mental alertness. Qty 30. Last fill 06-29-12

## 2012-08-26 ENCOUNTER — Other Ambulatory Visit: Payer: Self-pay | Admitting: Cardiology

## 2012-09-01 ENCOUNTER — Ambulatory Visit (INDEPENDENT_AMBULATORY_CARE_PROVIDER_SITE_OTHER): Payer: Medicare Other | Admitting: *Deleted

## 2012-09-01 DIAGNOSIS — Z8679 Personal history of other diseases of the circulatory system: Secondary | ICD-10-CM

## 2012-09-01 DIAGNOSIS — I4891 Unspecified atrial fibrillation: Secondary | ICD-10-CM

## 2012-09-01 DIAGNOSIS — I6350 Cerebral infarction due to unspecified occlusion or stenosis of unspecified cerebral artery: Secondary | ICD-10-CM

## 2012-09-16 ENCOUNTER — Ambulatory Visit (INDEPENDENT_AMBULATORY_CARE_PROVIDER_SITE_OTHER): Payer: Medicare Other | Admitting: Internal Medicine

## 2012-09-16 ENCOUNTER — Encounter: Payer: Self-pay | Admitting: Internal Medicine

## 2012-09-16 VITALS — BP 110/64 | HR 57 | Temp 97.6°F | Resp 12 | Ht 68.5 in | Wt 128.0 lb

## 2012-09-16 DIAGNOSIS — E039 Hypothyroidism, unspecified: Secondary | ICD-10-CM

## 2012-09-16 DIAGNOSIS — I4891 Unspecified atrial fibrillation: Secondary | ICD-10-CM

## 2012-09-16 DIAGNOSIS — R059 Cough, unspecified: Secondary | ICD-10-CM

## 2012-09-16 DIAGNOSIS — E041 Nontoxic single thyroid nodule: Secondary | ICD-10-CM

## 2012-09-16 DIAGNOSIS — E559 Vitamin D deficiency, unspecified: Secondary | ICD-10-CM

## 2012-09-16 DIAGNOSIS — R05 Cough: Secondary | ICD-10-CM

## 2012-09-16 DIAGNOSIS — E785 Hyperlipidemia, unspecified: Secondary | ICD-10-CM

## 2012-09-16 DIAGNOSIS — Z0000 Encounter for general adult medical examination without abnormal findings: Secondary | ICD-10-CM

## 2012-09-16 DIAGNOSIS — I10 Essential (primary) hypertension: Secondary | ICD-10-CM

## 2012-09-16 LAB — HEPATIC FUNCTION PANEL
AST: 31 U/L (ref 0–37)
Alkaline Phosphatase: 41 U/L (ref 39–117)
Bilirubin, Direct: 0.1 mg/dL (ref 0.0–0.3)
Total Protein: 6.9 g/dL (ref 6.0–8.3)

## 2012-09-16 LAB — BASIC METABOLIC PANEL
CO2: 31 mEq/L (ref 19–32)
Calcium: 9.2 mg/dL (ref 8.4–10.5)
Chloride: 99 mEq/L (ref 96–112)
Glucose, Bld: 91 mg/dL (ref 70–99)
Potassium: 4 mEq/L (ref 3.5–5.1)
Sodium: 140 mEq/L (ref 135–145)

## 2012-09-16 LAB — LIPID PANEL
HDL: 53.2 mg/dL (ref >39.00)
Total CHOL/HDL Ratio: 3
VLDL: 31 mg/dL (ref 0.0–40.0)

## 2012-09-16 LAB — TSH: TSH: 1.09 u[IU]/mL (ref 0.35–5.50)

## 2012-09-16 MED ORDER — LORAZEPAM 1 MG PO TABS: ORAL_TABLET | ORAL | Status: DC

## 2012-09-16 MED ORDER — LEVOTHYROXINE SODIUM 50 MCG PO TABS: ORAL_TABLET | ORAL | Status: DC

## 2012-09-16 MED ORDER — EZETIMIBE-SIMVASTATIN 10-40 MG PO TABS: ORAL_TABLET | ORAL | Status: DC

## 2012-09-16 MED ORDER — ENSURE PLUS PO LIQD: 1.0000 | Freq: Every day | ORAL | Status: DC

## 2012-09-16 NOTE — Addendum Note (Signed)
Addended by: Silvio Pate D on: 09/16/2012 04:21 PM   Modules accepted: Orders

## 2012-09-16 NOTE — Progress Notes (Signed)
Subjective:    Patient ID: Kristine Simmons, female    DOB: 1927-02-17, 77 y.o.   MRN: [id number]  HPI Medicare Wellness Visit:  The following psychosocial & medical history were reviewed as required by Medicare.   Social history: caffeine: 2 cups coffee & 1 tea/ day , alcohol:  no ,  tobacco use : quit 1995  & exercise : occasionally.   Home & personal  safety / fall risk: chronic imbalance, activities of daily living: no help required with ADL , seatbelt use : yes , and smoke alarm employment : yes .  Power of Attorney/Living Will status : in place  Vision ( as recorded per Nurse) & Hearing  evaluation : severe Glaucoma, seen every 3 mos. Hearing exam 2012; hearing aids dclined. Orientation :oriented X 3 , memory & recall :good,  math testing: good,and mood & affect : normal . Depression / anxiety: denied Travel history : last 38 [place] , immunization status :up to date , transfusion history:  no, and preventive health surveillance ( colonoscopies, BMD , etc as per protocol/ [facility]): colonoscopy not required, Dental care:  Seen annually . Chart reviewed &  Updated. Active issues reviewed & addressed.       Review of Systems She has been compliant with her statin. She has occasional palpitations and intermittent edema. She denies chest pain, significant dyspnea, or claudication. She is not having abdominal pain or diarrhea. She does take magnesium for constipation. Her thyroid medication has not been changed. She gets her calcium and a multivitamin and is not taking calcium or vitamin D as a separate supplement. She describes a cough for at least the last year. This is related to her meals, occurring immediately after she starts eating or at the conclusion meal. Sometimes she will sneeze repeatedly which resolves the cough. She does not recognize definite dysphagia; she does have some hoarseness. She is concerned she has lung cancer. She quit smoking as noted in 1995. She also intermittently has  some discomfort in the scapular area bilaterally; this occurs mainly at night when supine.                                                                             Objective:   Physical Exam Gen.: thin but adequately nourished in appearance. Alert, appropriate and cooperative throughout exam. Head: Normocephalic without obvious abnormalities  Eyes: No corneal or conjunctival inflammation noted. Ptosis OD > OS. Extraocular motion intact.  Ears: External  ear exam reveals no significant lesions or deformities. Canals clear .TMs mildly scarred Nose: External nasal exam reveals no deformity or inflammation. Nasal mucosa are pink and moist. No lesions or exudates noted.   Mouth: Oral mucosa and oropharynx reveal no lesions or exudates. Teeth in good repair. There is asymmetry to her smile. She has crepitus at the TMJs with mastication maneuvers Neck: No deformities, masses, or tenderness noted. Range of motion good. Thyroid : Smooth nodule suggested on the left; right lobe not palpable. Lungs: Normal respiratory effort; chest expands symmetrically. Lungs are clear to auscultation without rales, wheezes, or increased work of breathing. Breath sounds are decreased generally Heart: Normal rate and rhythm. Normal S1 and S2. No gallop, click, or  rub. Slight raspy grade 1 systolic murmur. Abdomen: Bowel sounds normal; abdomen soft and nontender. No masses, organomegaly or hernias noted. Genitalia: as per Gyn                                                                                Musculoskeletal/extremities: No deformity or scoliosis noted of  the thoracic or lumbar spine. There is no soreness to palpation over the scapula. She has mild PIP finger changes of degenerative joint disease. There is 1/2+ edema of the ankles. Pedal pulses are present. Vascular: Carotid, radial artery, dorsalis pedis and  posterior tibial pulses are full and equal. Bilateral carotid bruits present. Neurologic: Alert and  oriented x3. Deep tendon reflexes symmetrical and normal.          Skin: Intact without suspicious lesions or rashes. Lymph: No cervical, axillary lymphadenopathy present. Psych: Mood and affect are normal. Normally interactive                  Assessment & Plan:  #1 Medicare Wellness Exam; criteria met ; data entered #2 Problem List reviewed   #3 cough chronic mainly related to meals. Esophageal dysfunction or aspiration suggested. Barium swallow will be pursued  #4 scapular pain, positional. Chest x-ray will be performed to evaluate #3 and #4. Plan: see Orders

## 2012-09-16 NOTE — Patient Instructions (Addendum)
Review and correct the record as indicated. Please share record with all medical staff seen.   If you activate My Chart; the results can be released to you as soon as they populate from the lab. If you choose not to use this program; the labs have to be reviewed, copied & mailed   causing a delay in getting the results to you.   Depending on the lab results; we may need to change to Vytorin. Crestor 20 mg daily to be safer on the diltiazem If the chest x-ray and thyroid ultrasound are unchanged and indicate no active process; a barium swallow should be considered.

## 2012-09-18 ENCOUNTER — Telehealth: Payer: Self-pay

## 2012-09-18 NOTE — Telephone Encounter (Signed)
Pt states she can log into MyChart and I explained where she could find her test results after they are released. Pt understood.

## 2012-09-18 NOTE — Telephone Encounter (Signed)
Noted  

## 2012-09-18 NOTE — Telephone Encounter (Signed)
Left message on voicemail for patient to return call when available   

## 2012-09-18 NOTE — Telephone Encounter (Signed)
Message copied by Maurice Small on Fri Sep 18, 2012  2:21 PM ------      Message from: MCDANIELS, Virginia R      Created: Thu Sep 17, 2012  1:38 PM      Contact: Chayse       Please call patient stated she can not use mychart can you call her with her results      ALSO note I have sent Renee a message about her referral as she needs information on that as well

## 2012-09-20 LAB — VITAMIN D 1,25 DIHYDROXY
Vitamin D 1, 25 (OH)2 Total: 63 pg/mL (ref 18–72)
Vitamin D2 1, 25 (OH)2: <8 pg/mL

## 2012-09-21 ENCOUNTER — Ambulatory Visit
Admission: RE | Admit: 2012-09-21 | Discharge: 2012-09-21 | Disposition: A | Discharge status: Home / Self Care | Payer: Medicare Other | Source: Ambulatory Visit | Attending: Internal Medicine | Admitting: Internal Medicine

## 2012-09-21 DIAGNOSIS — E041 Nontoxic single thyroid nodule: Secondary | ICD-10-CM

## 2012-09-28 ENCOUNTER — Ambulatory Visit (INDEPENDENT_AMBULATORY_CARE_PROVIDER_SITE_OTHER): Payer: Medicare Other | Admitting: *Deleted

## 2012-09-28 DIAGNOSIS — Z7901 Long term (current) use of anticoagulants: Secondary | ICD-10-CM | POA: Insufficient documentation

## 2012-09-28 DIAGNOSIS — I4891 Unspecified atrial fibrillation: Secondary | ICD-10-CM

## 2012-09-28 DIAGNOSIS — Z8679 Personal history of other diseases of the circulatory system: Secondary | ICD-10-CM

## 2012-09-28 DIAGNOSIS — I6350 Cerebral infarction due to unspecified occlusion or stenosis of unspecified cerebral artery: Secondary | ICD-10-CM

## 2012-10-14 ENCOUNTER — Telehealth: Payer: Self-pay | Admitting: Internal Medicine

## 2012-10-14 NOTE — Telephone Encounter (Signed)
Spoke with patient, no indication on labs to change medication. Patient already with rx and will take it to the pharmacy

## 2012-10-14 NOTE — Telephone Encounter (Signed)
Patient states she is not sure if she is supposed to change to Vytorin or if she is supposed to keep taking what she is taking.  Dr. Alwyn Ren told her depending on her lab results, she may have to switch. Pt never heard one way or the other and it's time for her to get a refill. Please advise.

## 2012-10-16 ENCOUNTER — Ambulatory Visit (INDEPENDENT_AMBULATORY_CARE_PROVIDER_SITE_OTHER)
Admission: RE | Admit: 2012-10-16 | Discharge: 2012-10-16 | Disposition: A | Discharge status: Home / Self Care | Payer: Medicare Other | Source: Ambulatory Visit | Attending: Internal Medicine | Admitting: Internal Medicine

## 2012-10-16 DIAGNOSIS — R05 Cough: Secondary | ICD-10-CM

## 2012-11-09 ENCOUNTER — Ambulatory Visit (INDEPENDENT_AMBULATORY_CARE_PROVIDER_SITE_OTHER): Payer: Medicare Other

## 2012-11-09 DIAGNOSIS — I6350 Cerebral infarction due to unspecified occlusion or stenosis of unspecified cerebral artery: Secondary | ICD-10-CM

## 2012-11-09 DIAGNOSIS — Z7901 Long term (current) use of anticoagulants: Secondary | ICD-10-CM

## 2012-11-09 DIAGNOSIS — I4891 Unspecified atrial fibrillation: Secondary | ICD-10-CM

## 2012-11-09 DIAGNOSIS — Z8679 Personal history of other diseases of the circulatory system: Secondary | ICD-10-CM

## 2012-11-09 LAB — POCT INR: INR: 3

## 2012-12-11 ENCOUNTER — Encounter: Payer: Self-pay | Admitting: Internal Medicine

## 2012-12-21 ENCOUNTER — Ambulatory Visit (INDEPENDENT_AMBULATORY_CARE_PROVIDER_SITE_OTHER): Payer: Medicare Other

## 2012-12-21 DIAGNOSIS — I4891 Unspecified atrial fibrillation: Secondary | ICD-10-CM

## 2012-12-21 DIAGNOSIS — Z8679 Personal history of other diseases of the circulatory system: Secondary | ICD-10-CM

## 2012-12-21 DIAGNOSIS — Z7901 Long term (current) use of anticoagulants: Secondary | ICD-10-CM

## 2012-12-21 DIAGNOSIS — I6350 Cerebral infarction due to unspecified occlusion or stenosis of unspecified cerebral artery: Secondary | ICD-10-CM

## 2012-12-21 LAB — POCT INR: INR: 2.3

## 2013-01-11 ENCOUNTER — Encounter: Payer: Self-pay | Admitting: Neurology

## 2013-02-01 ENCOUNTER — Ambulatory Visit (INDEPENDENT_AMBULATORY_CARE_PROVIDER_SITE_OTHER): Payer: Medicare Other | Admitting: *Deleted

## 2013-02-01 DIAGNOSIS — I4891 Unspecified atrial fibrillation: Secondary | ICD-10-CM

## 2013-02-01 DIAGNOSIS — I6350 Cerebral infarction due to unspecified occlusion or stenosis of unspecified cerebral artery: Secondary | ICD-10-CM

## 2013-02-01 DIAGNOSIS — Z8679 Personal history of other diseases of the circulatory system: Secondary | ICD-10-CM

## 2013-02-01 DIAGNOSIS — Z7901 Long term (current) use of anticoagulants: Secondary | ICD-10-CM

## 2013-02-01 LAB — POCT INR: INR: 2.2

## 2013-02-02 ENCOUNTER — Encounter: Payer: Self-pay | Admitting: Neurology

## 2013-02-06 ENCOUNTER — Other Ambulatory Visit: Payer: Self-pay

## 2013-02-17 ENCOUNTER — Other Ambulatory Visit: Payer: Self-pay | Admitting: Cardiology

## 2013-02-17 NOTE — Telephone Encounter (Signed)
Please refill.

## 2013-03-02 ENCOUNTER — Telehealth: Payer: Self-pay | Admitting: Internal Medicine

## 2013-03-02 NOTE — Telephone Encounter (Signed)
refill  LORazepam (Tab) 1 MG TAKE 1 TABLET EVERY 8-12 HOURS AS NEEDED. CAN BE HABIT FORMING & MAY AFFECT MENTAL ALERTNESS #30 last fill 1.7.14

## 2013-03-02 NOTE — Telephone Encounter (Signed)
Spoke with patient, patient states she does not need this right now. Patient aware she will have to stop by the office to sign contract prior to next refill, patient verbalized understanding

## 2013-03-04 ENCOUNTER — Other Ambulatory Visit: Payer: Self-pay | Admitting: Dermatology

## 2013-03-15 ENCOUNTER — Ambulatory Visit (INDEPENDENT_AMBULATORY_CARE_PROVIDER_SITE_OTHER): Payer: Medicare Other | Admitting: *Deleted

## 2013-03-15 DIAGNOSIS — Z8679 Personal history of other diseases of the circulatory system: Secondary | ICD-10-CM

## 2013-03-15 DIAGNOSIS — I4891 Unspecified atrial fibrillation: Secondary | ICD-10-CM

## 2013-03-15 DIAGNOSIS — Z7901 Long term (current) use of anticoagulants: Secondary | ICD-10-CM

## 2013-03-15 DIAGNOSIS — I6350 Cerebral infarction due to unspecified occlusion or stenosis of unspecified cerebral artery: Secondary | ICD-10-CM

## 2013-03-15 LAB — POCT INR: INR: 2.1

## 2013-03-31 ENCOUNTER — Encounter: Payer: Self-pay | Admitting: Cardiology

## 2013-04-26 ENCOUNTER — Ambulatory Visit (INDEPENDENT_AMBULATORY_CARE_PROVIDER_SITE_OTHER): Payer: Medicare Other | Admitting: *Deleted

## 2013-04-26 DIAGNOSIS — I6350 Cerebral infarction due to unspecified occlusion or stenosis of unspecified cerebral artery: Secondary | ICD-10-CM

## 2013-04-26 DIAGNOSIS — Z8679 Personal history of other diseases of the circulatory system: Secondary | ICD-10-CM

## 2013-04-26 DIAGNOSIS — Z7901 Long term (current) use of anticoagulants: Secondary | ICD-10-CM

## 2013-04-26 DIAGNOSIS — I4891 Unspecified atrial fibrillation: Secondary | ICD-10-CM

## 2013-04-26 LAB — POCT INR: INR: 1.2

## 2013-05-03 ENCOUNTER — Ambulatory Visit: Payer: Medicare Other | Admitting: Cardiology

## 2013-05-10 ENCOUNTER — Encounter: Payer: Self-pay | Admitting: Cardiology

## 2013-05-10 ENCOUNTER — Ambulatory Visit (INDEPENDENT_AMBULATORY_CARE_PROVIDER_SITE_OTHER): Payer: Medicare Other | Admitting: Cardiology

## 2013-05-10 ENCOUNTER — Ambulatory Visit (INDEPENDENT_AMBULATORY_CARE_PROVIDER_SITE_OTHER): Payer: Medicare Other | Admitting: *Deleted

## 2013-05-10 VITALS — BP 127/74 | HR 81 | Ht 67.0 in | Wt 124.2 lb

## 2013-05-10 DIAGNOSIS — Z7901 Long term (current) use of anticoagulants: Secondary | ICD-10-CM

## 2013-05-10 DIAGNOSIS — I4891 Unspecified atrial fibrillation: Secondary | ICD-10-CM

## 2013-05-10 DIAGNOSIS — I6350 Cerebral infarction due to unspecified occlusion or stenosis of unspecified cerebral artery: Secondary | ICD-10-CM

## 2013-05-10 DIAGNOSIS — Z8679 Personal history of other diseases of the circulatory system: Secondary | ICD-10-CM

## 2013-05-10 MED ORDER — APIXABAN 2.5 MG PO TABS: 2.5000 mg | ORAL_TABLET | Freq: Two times a day (BID) | ORAL | Status: DC

## 2013-05-10 NOTE — Progress Notes (Signed)
HPI The patient presents for followup of atrial fibrillation.   Since I last saw her she has had no new complaints. She doesn't really notice palpitations and she's had no presyncope or syncope. She has had no chest pressure, neck or arm discomfort. She's had no weight gain and continues to have some mild ankle edema which is unchanged.  She does not like taking warfarin because of the co-pay with warfarin checks.    Allergies  Allergen Reactions  . Conj Estrog-Medroxyprogest Ace     REACTION: MD stopped  . Desmopressin Acetate     REACTION: swelling feet  . Dorzolamide Hcl   . Ensure (Nutritional Supplements)   . Hydrocodone-Acetaminophen     REACTION: dizzy, N/V  . Levofloxacin     REACTION: INTERFERS WITH COUMADIN  . Timolol Maleate   . Ciprofloxacin     REACTION: vaginal infection    Current Outpatient Prescriptions  Medication Sig Dispense Refill  . acetaminophen (TYLENOL) 500 MG tablet Take 500 mg by mouth. 2 by mouth as needed      . amoxicillin (AMOXIL) 500 MG capsule 4 pills 30-60 minutes pre-dental procedure  20 capsule  0  . bimatoprost (LUMIGAN) 0.03 % ophthalmic solution Place 1 drop into both eyes at bedtime.       . Calcium Carbonate-Vitamin D (CALCIUM-VITAMIN D) 500-200 MG-UNIT per tablet Take 1 tablet by mouth 2 (two) times daily.      Marland Kitchen CARDIZEM CD 120 MG 24 hr capsule TAKE (1) CAPSULE DAILY.  90 each  3  . cephALEXin (KEFLEX) 250 MG capsule Take 250 mg by mouth every other day.       . Cranberry (CRANBERRY CONCENTRATE) 500 MG CAPS Take 2 capsules by mouth daily.        . digoxin (LANOXIN) 0.125 MG tablet TAKE ONE TABLET BY MOUTH EVERY DAY  90 tablet  3  . diltiazem (CARDIZEM) 120 MG tablet Take 120 mg by mouth daily.      . diphenhydramine-acetaminophen (TYLENOL PM) 25-500 MG TABS as needed. Infrequently      . Ensure Plus (ENSURE PLUS) LIQD Take 1 Can by mouth daily.  24 Can  11  . estradiol (ESTRACE) 0.1 MG/GM vaginal cream Place 2 g vaginally. Twice weekly       . ezetimibe-simvastatin (VYTORIN) 10-40 MG per tablet TAKE 1 TABLET ONCE DAILY.  90 tablet  3  . levothyroxine (SYNTHROID, LEVOTHROID) 50 MCG tablet TAKE ONE TABLET BY MOUTH EVERY DAY  90 tablet  3  . LORazepam (ATIVAN) 0.5 MG tablet Take 0.5 mg by mouth as needed for anxiety.      Marland Kitchen LORazepam (ATIVAN) 1 MG tablet TAKE 1 TABLET EVERY 8-12 HOURS AS NEEDED. CAN BE HABIT FORMING & MAY AFFECT MENTAL ALERTNESS.  30 tablet  2  . Magnesium Oxide 500 MG TABS Take 1 tablet by mouth daily.        . Multiple Vitamin (MULTIVITAMIN) tablet Take 1 tablet by mouth daily. Super Multivit      . Probiotic Product (MISC INTESTINAL FLORA REGULAT) CAPS Take 1 capsule by mouth daily.        . timolol (TIMOPTIC) 0.5 % ophthalmic solution Place 1 drop into both eyes daily.       Marland Kitchen warfarin (COUMADIN) 5 MG tablet TAKE AS DIRECTED BY  ANTICOAGULATION  CLINIC  90 tablet  1   No current facility-administered medications for this visit.    Past Medical History  Diagnosis Date  . Hx of  colonic polyps     last colonoscopy 2005, repeat was due 2010 Dr. Jarold Motto  . Hyperlipidemia   . Osteoporosis   . Atrial fibrillation     Dr Antoine Poche  . Cerebrovascular accident 01/09  . Fracture 1995, 2009    RUE; LUE Dr. Cleophas Dunker  . 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 Hyperlipidemia   . Thyroid disorder     Past Surgical History  Procedure Laterality Date  . Tonsillectomy and adenoidectomy    . Vocal cord polyps    . Thyroid needle biopsy  2002  . Appendectomy    . Cataract extraction      Bilat  . Colonoscopy w/ polypectomy      Divericulosis 2005, Dr. Jarold Motto  . Total hip arthroplasty  2008    CHF & RAF post op   . Total shoulder replacement  2010    Dr Cleophas Dunker  . Mohs surgery  2013    nasal Basal Cell; Dr Irene Limbo   ROS:  Left eye blindness and decreased gait.  As stated in the HPI and negative for all other systems.  PHYSICAL EXAM BP 127/74  Pulse 81  Ht 5\' 7"  (1.702 m)  Wt  124 lb 3.2 oz (56.337 kg)  BMI 19.45 kg/m2 GENERAL:  Well appearing HEENT: Anisocoria, fundi not visualized, oral mucosa unremarkable, mild facial droop NECK:  No jugular venous distention, waveform within normal limits, carotid upstroke brisk and symmetric, right bruit vs transmitted murmur, no thyromegaly LUNGS:  Clear to auscultation bilaterally BACK:  No CVA tenderness CHEST:  Unremarkable HEART:  PMI not displaced or sustained,S1 and S2 within normal limits, no S3,no clicks, no rubs, apical systolic early peaking murmur, irregular ABD:  Flat, positive bowel sounds normal in frequency in pitch, no bruits, no rebound, no guarding, no midline pulsatile mass, no hepatomegaly, no splenomegaly EXT:  2 plus pulses upper and left DP/PT, diminished PT/DP right, mild edema, no cyanosis no clubbing NEURO:  Cranial nerves II through XII grossly intact other than slight facial droop, motor grossly intact throughout  EKG:  Atrial fibrillation, right axis deviation, no acute ST-T wave changes, nonspecific T-wave flattening. 05/10/2013  ASSESSMENT AND PLAN  ATRIAL FIBRILLATION:  The patient tolerates atrial fibrillation. She requests transition to Eliquis which will need to be dose adjusted for her age and weight.  CAROTID STENOSIS:  This is followed by Dr. Pearlean Brownie.

## 2013-05-10 NOTE — Patient Instructions (Addendum)
Please stop Warfarin and start Eliquis 2.5 mg one twice a day only after having another INR check on Wednesday. Continue all other medications as listed.  Please see the Coumadin Clinic on Wednesday for an appointment.  Follow up in 1 year with Dr Antoine Poche.  You will receive a letter in the mail 2 months before you are due.  Please call us when you receive this letter to schedule your follow up appointment.

## 2013-05-12 ENCOUNTER — Other Ambulatory Visit: Payer: Self-pay | Admitting: Cardiology

## 2013-05-12 ENCOUNTER — Telehealth: Payer: Self-pay | Admitting: Cardiology

## 2013-05-12 ENCOUNTER — Ambulatory Visit (INDEPENDENT_AMBULATORY_CARE_PROVIDER_SITE_OTHER): Payer: Medicare Other | Admitting: *Deleted

## 2013-05-12 DIAGNOSIS — Z8679 Personal history of other diseases of the circulatory system: Secondary | ICD-10-CM

## 2013-05-12 DIAGNOSIS — I4891 Unspecified atrial fibrillation: Secondary | ICD-10-CM

## 2013-05-12 DIAGNOSIS — Z7901 Long term (current) use of anticoagulants: Secondary | ICD-10-CM

## 2013-05-12 DIAGNOSIS — I6350 Cerebral infarction due to unspecified occlusion or stenosis of unspecified cerebral artery: Secondary | ICD-10-CM

## 2013-05-12 NOTE — Progress Notes (Signed)
Pt was started on Eliquis for Afib on 05/12/13.    Reviewed patients medication list.  Pt is not  currently on any combined P-gp and strong CYP3A4 inhibitors/inducers (ketoconazole, traconazole, ritonavir, carbamazepine, phenytoin, rifampin, St. John's wort).  Weight 56.3 Kg , Age >80 .   Dose appropriate for 2.5mg s BID dosing.  A full discussion of the nature of anticoagulants has been carried out.  A benefit/risk analysis has been presented to the patient, so that they understand the justification for choosing anticoagulation with Eliquis at this time.  The need for compliance is stressed.  Pt is aware to take the medication twice daily.  Side effects of potential bleeding are discussed, including unusual colored urine or stools, coughing up blood or coffee ground emesis, nose bleeds or serious fall or head trauma.  Discussed signs and symptoms of stroke. The patient should avoid any OTC items containing aspirin or ibuprofen.  Avoid alcohol consumption.   Call if any signs of abnormal bleeding.  Discussed financial obligations and resolved any difficulty in obtaining medication.  Next lab test test in 1 month.

## 2013-05-12 NOTE — Patient Instructions (Signed)
A full discussion of the nature of anticoagulants has been carried out.  A benefit/risk analysis has been presented to the patient, so that they understand the justification for choosing anticoagulation with Eliquis at this time.  The need for compliance is stressed.  Pt is aware to take the medication twice daily.  Side effects of potential bleeding are discussed, including unusual colored urine or stools, coughing up blood or coffee ground emesis, nose bleeds or serious fall or head trauma.  Discussed signs and symptoms of stroke. The patient should avoid any OTC items containing aspirin or ibuprofen.  Avoid alcohol consumption.   Call if any signs of abnormal bleeding.  Discussed financial obligations and resolved any difficulty in obtaining medication.  Next lab test test in 1 month.    

## 2013-05-12 NOTE — Telephone Encounter (Signed)
New problem   Pt calling regarding refill of eliquis-per conversation w/someone in coumadin this morning

## 2013-05-12 NOTE — Telephone Encounter (Signed)
Spoke with pt and she was unable to pick up Eliquis because she needs PA. Spoke with Pharmacy they sent Prior authorization  to Dr Antoine Poche. Sent staff mess to Dr Lindaann Slough nurse to look for PA so pt could get med asap. Return call to pt, no answer, LMOM and instructed to call before she runs out of samples.

## 2013-05-13 ENCOUNTER — Telehealth: Payer: Self-pay | Admitting: *Deleted

## 2013-05-13 NOTE — Telephone Encounter (Signed)
Requested paperwork from [facility] for prior approval for Eliquis 2.5 mg BID

## 2013-05-24 NOTE — Telephone Encounter (Signed)
Pt states she was able to use to $10 co-pay card without any problems

## 2013-06-14 ENCOUNTER — Ambulatory Visit (INDEPENDENT_AMBULATORY_CARE_PROVIDER_SITE_OTHER): Payer: Medicare Other | Admitting: *Deleted

## 2013-06-14 DIAGNOSIS — I4891 Unspecified atrial fibrillation: Secondary | ICD-10-CM

## 2013-06-14 LAB — CBC
HCT: 44.3 % (ref 36.0–46.0)
MCV: 89.5 fl (ref 78.0–100.0)
RDW: 14.4 % (ref 11.5–14.6)

## 2013-06-14 LAB — BASIC METABOLIC PANEL
Calcium: 9.3 mg/dL (ref 8.4–10.5)
GFR: 70.52 mL/min (ref >60.00)
Glucose, Bld: 93 mg/dL (ref 70–99)
Sodium: 139 mEq/L (ref 135–145)

## 2013-06-14 NOTE — Progress Notes (Signed)
Pt was started on Eliquis for Afib on 04/2013.    Reviewed patients medication list.  Pt is not currently on any combined P-gp and strong CYP3A4 inhibitors/inducers (ketoconazole, traconazole, ritonavir, carbamazepine, phenytoin, rifampin, St. John's wort).  Reviewed labs.  SCr 0.8, Weight 56 Kg. Dose is appropriate based on age and weight.  Hgb and HCT 14.7/44.3 .   A full discussion of the nature of anticoagulants has been carried out.  A benefit/risk analysis has been presented to the patient, so that they understand the justification for choosing anticoagulation with Eliquis at this time.  The need for compliance is stressed.  Pt is aware to take the medication twice daily.  Side effects of potential bleeding are discussed, including unusual colored urine or stools, coughing up blood or coffee ground emesis, nose bleeds or serious fall or head trauma.  Discussed signs and symptoms of stroke. The patient should avoid any OTC items containing aspirin or ibuprofen.  Avoid alcohol consumption.   Call if any signs of abnormal bleeding.  Discussed financial obligations and resolved any difficulty in obtaining medication.  Next lab test test in 6 months.

## 2013-06-22 ENCOUNTER — Telehealth: Payer: Self-pay | Admitting: *Deleted

## 2013-06-22 ENCOUNTER — Other Ambulatory Visit: Payer: Self-pay | Admitting: *Deleted

## 2013-06-22 MED ORDER — APIXABAN 2.5 MG PO TABS: 2.5000 mg | ORAL_TABLET | Freq: Two times a day (BID) | ORAL | Status: DC

## 2013-06-22 NOTE — Telephone Encounter (Signed)
Eliquis refilled and samples placed at front desk.  Pt notified.

## 2013-06-22 NOTE — Telephone Encounter (Signed)
Patient needs refill on Eliquis. Patient states pharmacy has call for refill approval but know one has returned their call. Also is asking for samples. Please call patient at  [phone].

## 2013-07-06 ENCOUNTER — Telehealth: Payer: Self-pay | Admitting: Internal Medicine

## 2013-07-06 MED ORDER — DILTIAZEM HCL ER COATED BEADS 120 MG PO CP24: ORAL_CAPSULE | ORAL | Status: DC

## 2013-07-06 NOTE — Telephone Encounter (Signed)
Pt aware of lab results 

## 2013-07-06 NOTE — Telephone Encounter (Signed)
Follow up ° °Pt is calling regarding her lab results.  °

## 2013-08-11 ENCOUNTER — Other Ambulatory Visit: Payer: Self-pay | Admitting: Cardiology

## 2013-08-12 ENCOUNTER — Other Ambulatory Visit: Payer: Self-pay | Admitting: Orthopedic Surgery

## 2013-08-16 ENCOUNTER — Encounter (HOSPITAL_BASED_OUTPATIENT_CLINIC_OR_DEPARTMENT_OTHER): Payer: Self-pay | Admitting: *Deleted

## 2013-08-16 NOTE — Progress Notes (Signed)
Bring all medications. Dr. Sampson Goon reviewed EKG from 05/10/2013 and history - Ok for surgery. Do not need BMET just HGB day of surgery.

## 2013-08-17 ENCOUNTER — Ambulatory Visit (HOSPITAL_BASED_OUTPATIENT_CLINIC_OR_DEPARTMENT_OTHER): Payer: Medicare Other | Admitting: Anesthesiology

## 2013-08-17 ENCOUNTER — Ambulatory Visit (HOSPITAL_BASED_OUTPATIENT_CLINIC_OR_DEPARTMENT_OTHER)
Admission: RE | Admit: 2013-08-17 | Discharge: 2013-08-17 | Disposition: A | Discharge status: Home / Self Care | Payer: Medicare Other | Source: Ambulatory Visit | Attending: Orthopedic Surgery | Admitting: Orthopedic Surgery

## 2013-08-17 ENCOUNTER — Encounter (HOSPITAL_BASED_OUTPATIENT_CLINIC_OR_DEPARTMENT_OTHER): Payer: Self-pay | Admitting: Anesthesiology

## 2013-08-17 ENCOUNTER — Encounter (HOSPITAL_BASED_OUTPATIENT_CLINIC_OR_DEPARTMENT_OTHER): Payer: Self-pay | Admitting: *Deleted

## 2013-08-17 ENCOUNTER — Encounter (HOSPITAL_BASED_OUTPATIENT_CLINIC_OR_DEPARTMENT_OTHER)
Admission: RE | Disposition: A | Discharge status: Home / Self Care | Payer: Self-pay | Source: Ambulatory Visit | Attending: Orthopedic Surgery

## 2013-08-17 DIAGNOSIS — M6740 Ganglion, unspecified site: Secondary | ICD-10-CM | POA: Insufficient documentation

## 2013-08-17 DIAGNOSIS — M19049 Primary osteoarthritis, unspecified hand: Secondary | ICD-10-CM | POA: Insufficient documentation

## 2013-08-17 HISTORY — DX: Cystitis, unspecified without hematuria: N30.90

## 2013-08-17 HISTORY — DX: Adverse effect of unspecified anesthetic, initial encounter: T41.45XA

## 2013-08-17 HISTORY — PX: EXCISION METACARPAL MASS: SHX6372

## 2013-08-17 HISTORY — DX: Other complications of anesthesia, initial encounter: T88.59XA

## 2013-08-17 HISTORY — DX: Hypothyroidism, unspecified: E03.9

## 2013-08-17 SURGERY — EXCISION METACARPAL MASS
Anesthesia: Monitor Anesthesia Care | Site: Hand | Laterality: Right | Wound class: Clean

## 2013-08-17 MED ORDER — FENTANYL CITRATE 0.05 MG/ML IJ SOLN
INTRAMUSCULAR | Status: DC | PRN
  Administered 2013-08-17 (×2): 25 ug via INTRAVENOUS

## 2013-08-17 MED ORDER — CEFAZOLIN SODIUM-DEXTROSE 2-3 GM-% IV SOLR
2.0000 g | INTRAVENOUS | Status: AC
  Administered 2013-08-17: 2 g via INTRAVENOUS

## 2013-08-17 MED ORDER — BUPIVACAINE HCL (PF) 0.25 % IJ SOLN
INTRAMUSCULAR | Status: DC | PRN
  Administered 2013-08-17: 10 mL

## 2013-08-17 MED ORDER — CHLORHEXIDINE GLUCONATE 4 % EX LIQD: 60.0000 mL | Freq: Once | CUTANEOUS | Status: DC

## 2013-08-17 MED ORDER — LIDOCAINE HCL (PF) 0.5 % IJ SOLN
INTRAMUSCULAR | Status: DC | PRN
  Administered 2013-08-17: 30 mL via INTRAVENOUS

## 2013-08-17 MED ORDER — PROPOFOL INFUSION 10 MG/ML OPTIME
INTRAVENOUS | Status: DC | PRN
  Administered 2013-08-17: 25 ug/kg/min via INTRAVENOUS

## 2013-08-17 MED ORDER — PROPOFOL 10 MG/ML IV BOLUS
INTRAVENOUS | Status: DC | PRN
  Administered 2013-08-17: 10 mg via INTRAVENOUS

## 2013-08-17 MED ORDER — FENTANYL CITRATE 0.05 MG/ML IJ SOLN: 50.0000 ug | INTRAMUSCULAR | Status: DC | PRN

## 2013-08-17 MED ORDER — MIDAZOLAM HCL 5 MG/5ML IJ SOLN
INTRAMUSCULAR | Status: DC | PRN
  Administered 2013-08-17 (×2): 0.5 mg via INTRAVENOUS

## 2013-08-17 MED ORDER — FENTANYL CITRATE 0.05 MG/ML IJ SOLN: 25.0000 ug | INTRAMUSCULAR | Status: DC | PRN

## 2013-08-17 MED ORDER — OXYCODONE-ACETAMINOPHEN 5-325 MG PO TABS: ORAL_TABLET | ORAL | Status: DC

## 2013-08-17 MED ORDER — MIDAZOLAM HCL 2 MG/2ML IJ SOLN: 1.0000 mg | INTRAMUSCULAR | Status: DC | PRN

## 2013-08-17 MED ORDER — LACTATED RINGERS IV SOLN
INTRAVENOUS | Status: DC
  Administered 2013-08-17: 13:00:00 via INTRAVENOUS

## 2013-08-17 SURGICAL SUPPLY — 50 items
BANDAGE COBAN STERILE 2 (GAUZE/BANDAGES/DRESSINGS) IMPLANT
BANDAGE CONFORM 2  STR LF (GAUZE/BANDAGES/DRESSINGS) IMPLANT
BANDAGE ELASTIC 3 VELCRO ST LF (GAUZE/BANDAGES/DRESSINGS) IMPLANT
BANDAGE GAUZE ELAST BULKY 4 IN (GAUZE/BANDAGES/DRESSINGS) IMPLANT
BANDAGE GAUZE STRT 1 STR LF (GAUZE/BANDAGES/DRESSINGS) IMPLANT
BENZOIN TINCTURE PRP APPL 2/3 (GAUZE/BANDAGES/DRESSINGS) IMPLANT
BLADE MINI RND TIP GREEN BEAV (BLADE) IMPLANT
BLADE SURG 15 STRL LF DISP TIS (BLADE) ×2 IMPLANT
BLADE SURG 15 STRL SS (BLADE) ×2
BNDG COHESIVE 1X5 TAN STRL LF (GAUZE/BANDAGES/DRESSINGS) ×2 IMPLANT
BNDG ELASTIC 2 VLCR STRL LF (GAUZE/BANDAGES/DRESSINGS) IMPLANT
BNDG ESMARK 4X9 LF (GAUZE/BANDAGES/DRESSINGS) IMPLANT
BNDG PLASTER X FAST 3X3 WHT LF (CAST SUPPLIES) IMPLANT
CHLORAPREP W/TINT 26ML (MISCELLANEOUS) ×2 IMPLANT
CLOTH BEACON ORANGE TIMEOUT ST (SAFETY) ×2 IMPLANT
CORDS BIPOLAR (ELECTRODE) ×2 IMPLANT
COVER MAYO STAND STRL (DRAPES) ×2 IMPLANT
COVER TABLE BACK 60X90 (DRAPES) ×2 IMPLANT
CUFF TOURNIQUET SINGLE 18IN (TOURNIQUET CUFF) ×2 IMPLANT
DRAPE EXTREMITY T 121X128X90 (DRAPE) ×2 IMPLANT
DRAPE SURG 17X23 STRL (DRAPES) ×2 IMPLANT
GAUZE XEROFORM 1X8 LF (GAUZE/BANDAGES/DRESSINGS) ×2 IMPLANT
GLOVE BIO SURGEON STRL SZ7.5 (GLOVE) ×2 IMPLANT
GLOVE BIOGEL PI IND STRL 8 (GLOVE) ×1 IMPLANT
GLOVE BIOGEL PI IND STRL 8.5 (GLOVE) ×1 IMPLANT
GLOVE BIOGEL PI INDICATOR 8 (GLOVE) ×1
GLOVE BIOGEL PI INDICATOR 8.5 (GLOVE) ×1
GLOVE SURG ORTHO 8.0 STRL STRW (GLOVE) ×2 IMPLANT
GOWN BRE IMP PREV XXLGXLNG (GOWN DISPOSABLE) ×4 IMPLANT
GOWN PREVENTION PLUS XLARGE (GOWN DISPOSABLE) ×2 IMPLANT
NEEDLE HYPO 25X1 1.5 SAFETY (NEEDLE) ×2 IMPLANT
NS IRRIG 1000ML POUR BTL (IV SOLUTION) ×2 IMPLANT
PACK BASIN DAY SURGERY FS (CUSTOM PROCEDURE TRAY) ×2 IMPLANT
PAD CAST 3X4 CTTN HI CHSV (CAST SUPPLIES) IMPLANT
PAD CAST 4YDX4 CTTN HI CHSV (CAST SUPPLIES) IMPLANT
PADDING CAST ABS 4INX4YD NS (CAST SUPPLIES) ×1
PADDING CAST ABS COTTON 4X4 ST (CAST SUPPLIES) ×1 IMPLANT
PADDING CAST COTTON 3X4 STRL (CAST SUPPLIES)
PADDING CAST COTTON 4X4 STRL (CAST SUPPLIES)
SPLINT FINGER 5/8X3.25 (SOFTGOODS) ×1 IMPLANT
SPLINT FINGER FOAM 3 9119 05 (SOFTGOODS) ×2
SPONGE GAUZE 4X4 12PLY (GAUZE/BANDAGES/DRESSINGS) ×2 IMPLANT
STOCKINETTE 4X48 STRL (DRAPES) ×2 IMPLANT
STRIP CLOSURE SKIN 1/2X4 (GAUZE/BANDAGES/DRESSINGS) IMPLANT
SUT ETHILON 3 0 PS 1 (SUTURE) IMPLANT
SUT ETHILON 4 0 PS 2 18 (SUTURE) ×2 IMPLANT
SYR BULB 3OZ (MISCELLANEOUS) ×2 IMPLANT
SYR CONTROL 10ML LL (SYRINGE) ×2 IMPLANT
TOWEL OR 17X24 6PK STRL BLUE (TOWEL DISPOSABLE) ×4 IMPLANT
UNDERPAD 30X30 INCONTINENT (UNDERPADS AND DIAPERS) ×2 IMPLANT

## 2013-08-17 NOTE — H&P (Signed)
Kristine Simmons is an 79 y.o. female.   Chief Complaint: right long finger mucoid cyst HPI: 79 yo rhd female with cyst on right long finger x 1 year.  Also notes nail deformity.  No reported injury.  She has tried to pop the cyst in the past.  She wishes to have the cyst removed and dip joint debrided.  Past Medical History  Diagnosis Date  . Hx of colonic polyps     last colonoscopy 2005, repeat was due 2010 Dr. Jarold Motto  . Hyperlipidemia   . Osteoporosis   . Atrial fibrillation     Dr Antoine Poche  . Cerebrovascular accident 01/09  . Fracture 1995, 2009    RUE; LUE Dr. Cleophas Dunker  . 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 Hyperlipidemia   . Thyroid disorder   . Hypothyroidism   . Cystitis     Past Surgical History  Procedure Laterality Date  . Tonsillectomy and adenoidectomy    . Vocal cord polyps    . Thyroid needle biopsy  2002  . Appendectomy    . Cataract extraction      Bilat  . Colonoscopy w/ polypectomy      Divericulosis 2005, Dr. Jarold Motto  . Total hip arthroplasty  2008    CHF & RAF post op   . Total shoulder replacement  2010    Dr Cleophas Dunker - left shoulder  . Mohs surgery  2013    nasal Basal Cell; Dr Irene Limbo    Family History  Problem Relation Age of Onset  . Stroke Father     > 65  . Diabetes Father   . Leukemia Mother   . Cancer Mother     leukemia  . Colon cancer Brother     Valve replacement  . Cancer Brother     colon  . Heart disease Brother   . Breast cancer Sister   . Cancer Sister     breast  . Ovarian cancer Sister   . Cancer Sister     ovarian  . Lung cancer Sister   . Cancer Sister     lung  . Prostate cancer Brother   . Cancer Brother     bladder   Social History:  reports that she quit smoking about 19 years ago. She does not have any smokeless tobacco history on file. She reports that she does not drink alcohol or use illicit drugs.  Allergies:  Allergies  Allergen Reactions  . Conj  Estrog-Medroxyprogest Ace     REACTION: MD stopped  . Desmopressin Acetate     REACTION: swelling feet  . Dorzolamide Hcl Other (See Comments)    Burning in eyes and redness  . Ensure [Nutritional Supplements]   . Hydrocodone-Acetaminophen     REACTION: dizzy, N/V  . Levofloxacin     REACTION: INTERFERS WITH COUMADIN  . Timolol Maleate   . Ciprofloxacin     REACTION: vaginal infection    No prescriptions prior to admission    No results found for this or any previous visit (from the past 48 hour(s)).  No results found.   A comprehensive review of systems was negative except for: Constitutional: positive for weight loss Eyes: positive for contacts/glasses Ears, nose, mouth, throat, and face: positive for hearing loss Respiratory: positive for cough Hematologic/lymphatic: positive for easy bruising  Height 5\' 7"  (1.702 m), weight 120 lb (54.432 kg).  General appearance: alert, cooperative and appears stated age Head: Normocephalic,  without obvious abnormality, atraumatic Neck: supple, symmetrical, trachea midline Resp: clear to auscultation bilaterally Cardio: regular rate and rhythm GI: non tender Extremities: intact sensation and capillary refill all digits.  +epl/fpl/io.  cyst on dorsum of right long finger.  skin intact.   Pulses: 2+ and symmetric Skin: Skin color, texture, turgor normal. No rashes or lesions Neurologic: Grossly normal Incision/Wound: na  Assessment/Plan Right long finger mucoid cyst.  Non operative and operative treatment options were discussed with the patient and patient wishes to proceed with operative treatment. Risks, benefits, and alternatives of surgery were discussed and the patient agrees with the plan of care.   Dhruva Orndoff R 08/17/2013, 9:48 AM

## 2013-08-17 NOTE — Transfer of Care (Signed)
Immediate Anesthesia Transfer of Care Note  Patient: Kristine Simmons  Procedure(s) Performed: Procedure(s): RIGHT LONG EXCISION MASS AND DIP JOINT DEBRIDEMENT (Right)  Patient Location: PACU  Anesthesia Type:MAC and Bier block  Level of Consciousness: awake, alert  and oriented  Airway & Oxygen Therapy: Patient Spontanous Breathing and Patient connected to face mask oxygen  Post-op Assessment: Report given to PACU RN and Post -op Vital signs reviewed and stable  Post vital signs: Reviewed and stable  Complications: No apparent anesthesia complications

## 2013-08-17 NOTE — Anesthesia Preprocedure Evaluation (Signed)
Anesthesia Evaluation  Patient identified by MRN, date of birth, ID band Patient awake    Reviewed: Allergy & Precautions, H&P , NPO status , Patient's Chart, lab work & pertinent test results  History of Anesthesia Complications (+) PROLONGED EMERGENCE  Airway Mallampati: II TM Distance: >3 FB Neck ROM: Full    Dental no notable dental hx. (+) Teeth Intact and Dental Advisory Given   Pulmonary neg pulmonary ROS,  breath sounds clear to auscultation  Pulmonary exam normal       Cardiovascular hypertension, On Medications + Peripheral Vascular Disease + dysrhythmias Atrial Fibrillation Rhythm:Irregular Rate:Normal     Neuro/Psych CVA, Residual Symptoms negative psych ROS   GI/Hepatic negative GI ROS, Neg liver ROS,   Endo/Other  Hypothyroidism   Renal/GU negative Renal ROS  negative genitourinary   Musculoskeletal   Abdominal   Peds  Hematology negative hematology ROS (+)   Anesthesia Other Findings   Reproductive/Obstetrics negative OB ROS                           Anesthesia Physical Anesthesia Plan  ASA: III  Anesthesia Plan: MAC and Bier Block   Post-op Pain Management:    Induction: Intravenous  Airway Management Planned: Simple Face Mask  Additional Equipment:   Intra-op Plan:   Post-operative Plan:   Informed Consent: I have reviewed the patients History and Physical, chart, labs and discussed the procedure including the risks, benefits and alternatives for the proposed anesthesia with the patient or authorized representative who has indicated his/her understanding and acceptance.   Dental advisory given  Plan Discussed with: CRNA  Anesthesia Plan Comments:         Anesthesia Quick Evaluation

## 2013-08-17 NOTE — Brief Op Note (Signed)
08/17/2013  2:22 PM  PATIENT:  Kristine Simmons  79 y.o. female  PRE-OPERATIVE DIAGNOSIS:  RIGHT LONG MUCOID CYST AND DIP ARTHROSIS  POST-OPERATIVE DIAGNOSIS:  Right Long Mucoid Cyst and Distal Interphalangeal Arthrosis  PROCEDURE:  Procedure(s): RIGHT LONG EXCISION MASS AND DIP JOINT DEBRIDEMENT  SURGEON:  Surgeon(s): Tami Ribas, MD Nicki Reaper, MD  PHYSICIAN ASSISTANT:   ASSISTANTS: Cindee Salt, MD   ANESTHESIA:   Bier block  EBL:  Total I/O In: 200 [I.V.:200] Out: -   DRAINS: none   LOCAL MEDICATIONS USED:  MARCAINE     SPECIMEN:  Source of Specimen:  right long finger  DISPOSITION OF SPECIMEN:  PATHOLOGY  COUNTS:  YES  TOURNIQUET:   Total Tourniquet Time Documented: Forearm (Right) - 28 minutes Total: Forearm (Right) - 28 minutes   DICTATION: .Other Dictation: Dictation Number [id number]  PLAN OF CARE: Discharge to home after PACU

## 2013-08-17 NOTE — Op Note (Signed)
[id number] 

## 2013-08-17 NOTE — Anesthesia Procedure Notes (Signed)
Anesthesia Regional Block:  Bier block (IV Regional)  Pre-Anesthetic Checklist: ,, timeout performed, Correct Patient, Correct Site, Correct Laterality, Correct Procedure,, site marked, surgical consent,, at surgeon's request  Laterality: Right     Needles:  Injection technique: Single-shot  Needle Type: Other      Needle Gauge: 20 and 20 G    Additional Needles: Bier block (IV Regional) Narrative:   Performed by: Personally   Bier block (IV Regional)

## 2013-08-17 NOTE — Anesthesia Postprocedure Evaluation (Signed)
Anesthesia Post Note  Patient: Kristine Simmons  Procedure(s) Performed: Procedure(s) (LRB): RIGHT LONG EXCISION MASS AND DIP JOINT DEBRIDEMENT (Right)  Anesthesia type: MAC  Patient location: PACU  Post pain: Pain level controlled  Post assessment: Patient's Cardiovascular Status Stable  Last Vitals:  Filed Vitals:   08/17/13 1500  BP:   Pulse: 78  Temp:   Resp: 13    Post vital signs: Reviewed and stable  Level of consciousness: sedated  Complications: No apparent anesthesia complications

## 2013-08-18 ENCOUNTER — Encounter (HOSPITAL_BASED_OUTPATIENT_CLINIC_OR_DEPARTMENT_OTHER): Payer: Self-pay | Admitting: Orthopedic Surgery

## 2013-08-18 NOTE — Op Note (Signed)
NAME:  Kristine Simmons, Kristine Simmons               ACCOUNT NO.:  [id number]  MEDICAL RECORD NO.:  [id number]  LOCATION:                                 FACILITY:  PHYSICIAN:  Betha Loa, MD             DATE OF BIRTH:  DATE OF PROCEDURE:  08/17/2013 DATE OF DISCHARGE:                              OPERATIVE REPORT   PREOPERATIVE DIAGNOSIS:  Right long finger mucoid cyst and distal interphalangeal joint arthrosis.  POSTOPERATIVE DIAGNOSIS:  Right long finger mucoid cyst and distal interphalangeal joint arthrosis.  PROCEDURE:  Right long finger excision of mass and distal interphalangeal joint debridement.  SURGEON:  Betha Loa, MD  ASSISTANT:  Cindee Salt, MD.  ANESTHESIA:  Bier block with sedation.  IV FLUIDS:  Per anesthesia flow sheet.  ESTIMATED BLOOD LOSS:  Minimal.  COMPLICATIONS:  None.  SPECIMENS:  Right long finger mucoid cyst to Pathology.  TOURNIQUET TIME:  28 minutes.  DISPOSITION:  Stable to PACU.  INDICATIONS:  Kristine Simmons is an 79 year old female who has noted a mass on her right long finger for approximately 1 year.  She has tried to pop this in the past.  It is bothersome to her.  She would like to have it removed.  Risks, benefits, and alternatives of surgery were discussed including risk of blood loss, infection, damage to nerves, vessels, tendons, ligaments, bone; failure of surgery; need for additional surgery, complications with wound healing, continued pain, and recurrence of the cyst.  She voiced understanding of these risks and elected to proceed.  OPERATIVE COURSE:  After being identified preoperatively by myself, the patient and I agreed upon procedure and site of procedure.  Surgical site was marked.  The risks, benefits, and alternatives of surgery were reviewed and she wished to proceed.  Surgical consent had been signed. She was given IV Ancef as preoperative antibiotic prophylaxis.  She was transported to the operating room, placed on the operating  room table in a supine position with the right upper extremity on arm board.  Bier block anesthesia was induced by anesthesiologist.  Right upper extremity was prepped and draped in normal sterile orthopedic fashion.  Surgical pause was performed between surgeons, anesthesia, and operating staff, and all were in agreement as to the patient, procedure, and site of procedure.  The tourniquet had been inflated for the Bier block. Incision was made in a hockey-stick shape on the dorsum of the right long finger.  This was carried into subcutaneous tissues by spreading technique.  The cyst was easily identified.  It was freed up of soft tissue adherence.  It was removed and sent to Pathology for examination. The DIP joint was entered underneath the extensor tendon.  It was debrided using the synovectomy rongeurs.  The wounds were copiously irrigated with sterile saline.  It was then closed with 4-0 nylon in a horizontal mattress fashion.  A digital block was performed with 10 mL of 0.25% plain Marcaine to aid in postoperative analgesia.  It was then dressed with sterile Xeroform, 4x4s, and wrapped with Coban dressing lightly.  An AlumaFoam splint was placed and wrapped with Coban dressing as well.  Tourniquet  was deflated at 28 minutes.  She was transferred back to the stretcher and taken to PACU in stable condition.  I will see her back in the office in 1 week for postoperative followup.  I will give Percocet 5/325 one to two p.o. q.6 hours p.r.n. pain, dispensed #30.  She has had a reaction to hydrocodone in the past and states it was nausea reaction she can tolerate if necessary.     Betha Loa, MD     KK/MEDQ  D:  08/17/2013  T:  08/18/2013  Job:  [id number]

## 2013-09-20 ENCOUNTER — Telehealth: Payer: Self-pay

## 2013-09-20 NOTE — Telephone Encounter (Signed)
Meds reconciled, pharmacy and allergies verified.  

## 2013-09-20 NOTE — Telephone Encounter (Signed)
LM for CB  HM UTD WE Colonoscopy, shingles, flu and 1 pneumonia vaccine

## 2013-09-21 ENCOUNTER — Encounter: Payer: Self-pay | Admitting: Internal Medicine

## 2013-09-21 ENCOUNTER — Ambulatory Visit (INDEPENDENT_AMBULATORY_CARE_PROVIDER_SITE_OTHER): Payer: Medicare Other | Admitting: Internal Medicine

## 2013-09-21 VITALS — BP 103/68 | HR 71 | Temp 97.6°F | Ht 68.5 in | Wt 122.4 lb

## 2013-09-21 DIAGNOSIS — E785 Hyperlipidemia, unspecified: Secondary | ICD-10-CM

## 2013-09-21 DIAGNOSIS — E039 Hypothyroidism, unspecified: Secondary | ICD-10-CM

## 2013-09-21 DIAGNOSIS — E041 Nontoxic single thyroid nodule: Secondary | ICD-10-CM

## 2013-09-21 DIAGNOSIS — M810 Age-related osteoporosis without current pathological fracture: Secondary | ICD-10-CM

## 2013-09-21 DIAGNOSIS — E559 Vitamin D deficiency, unspecified: Secondary | ICD-10-CM

## 2013-09-21 DIAGNOSIS — Z0000 Encounter for general adult medical examination without abnormal findings: Secondary | ICD-10-CM

## 2013-09-21 DIAGNOSIS — Z8679 Personal history of other diseases of the circulatory system: Secondary | ICD-10-CM

## 2013-09-21 LAB — LIPID PANEL
Cholesterol: 141 mg/dL (ref 0–200)
Total CHOL/HDL Ratio: 2
Triglycerides: 84 mg/dL (ref 0.0–149.0)

## 2013-09-21 LAB — HEPATIC FUNCTION PANEL
ALT: 21 U/L (ref 0–35)
Bilirubin, Direct: 0.1 mg/dL (ref 0.0–0.3)
Total Protein: 7.3 g/dL (ref 6.0–8.3)

## 2013-09-21 LAB — TSH: TSH: 0.61 u[IU]/mL (ref 0.35–5.50)

## 2013-09-21 MED ORDER — LORAZEPAM 1 MG PO TABS: ORAL_TABLET | ORAL | Status: DC

## 2013-09-21 MED ORDER — ENSURE PLUS PO LIQD: 1.0000 | Freq: Every day | ORAL | Status: DC

## 2013-09-21 MED ORDER — EZETIMIBE-SIMVASTATIN 10-40 MG PO TABS: ORAL_TABLET | ORAL | Status: DC

## 2013-09-21 MED ORDER — LEVOTHYROXINE SODIUM 50 MCG PO TABS: ORAL_TABLET | ORAL | Status: DC

## 2013-09-21 NOTE — Patient Instructions (Signed)
Share results with all non [facility] medical staff seen  

## 2013-09-21 NOTE — Progress Notes (Signed)
Subjective:    Patient ID: Kristine Simmons, female    DOB: March 13, 1927, 79 y.o.   MRN: [id number]  HPI Medicare Wellness Visit: Psychosocial and medical history were reviewed as required by Medicare (abuse, antisocial behavior risk, forearm risk). Social history: Caffeine: none , Alcohol:no  , Tobacco WUJ:WJXB 1995 Exercise:see below Personal safety/fall risk:no Limitations of activities of daily living:no Seatbelt smoke alarm use:yes Healthcare Power of Attorney/Living Will status: in place Ophthalmologic exam status:current Hearing evaluation status: not current Orientation: Oriented X3 Memory and recall: good Math testing: good Depression/anxiety assessment: denied Foreign travel history: 23 [place] Immunization status the shingles/bleeding/pneumonia/tetanus:current Transfusion history:no Preventive health care maintenance status: Colonoscopy/BMD/mammogram/Pap as per protocol/standard care: aged out of colonoscopy survelliance; BMD due Dental care:every 12 mos Chart reviewed and updated. Active issues reviewed and addressed.    Review of Systems She is on a modified heart healthy diet; she exercises as walking  30 minutes < 5 times per week without symptoms. Specifically she denies chest pain, palpitations, dyspnea, or claudication.  Family history is negative for premature coronary disease . Her LDL goal is less than 100,ideally < 70. There is medication compliance with the statin. Magnesium addresses constipatiuon; some short term  memory deficit. No significant  myalgias described .     Objective:   Physical Exam Gen.: Thin but healthy and well-nourished in appearance. Alert, appropriate and cooperative throughout exam.Appears younger than stated age  Head: Normocephalic without obvious abnormalities  Eyes: No corneal or conjunctival inflammation noted. Ptosis & blindness OS. Extraocular motion intact. Vision grossly normal with lenses OD Ears: External  ear exam reveals no  significant lesions or deformities. Canals clear .TMs normal. Hearing is grossly decreased bilaterally. Nose: External nasal exam reveals no deformity or inflammation. Nasal mucosa are pink and moist. No lesions or exudates noted.  Mouth: Oral mucosa and oropharynx reveal no lesions or exudates. Teeth in good repair.Asymmetric nasolabial fold Neck: No deformities, masses, or tenderness noted. Range of motion good. Small L thyroid goiter/nodule. Lungs: Normal respiratory effort; chest expands symmetrically. Lungs are clear to auscultation without rales, wheezes, or increased work of breathing. Heart: Irregular  rate and rhythm. Normal S1 and S2. No gallop, click, or rub. Flow murmur. Abdomen: Bowel sounds normal; abdomen soft and nontender. No masses, organomegaly or hernias noted. Genitalia: As per Gyn                                  Musculoskeletal/extremities: No deformity or scoliosis noted of  the thoracic or lumbar spine.  No clubbing, cyanosis, edema, or significant extremity  deformity noted. Range of motion normal .Tone & strength  Normal. Joints normal except fusiform knees. Nail health good. Able to lie down & sit up w/o help. Negative SLR bilaterally to 90 degrees Vascular: Carotid, radial artery, dorsalis pedis and  posterior tibial pulses are  equal. Decreased pedal pulses.No bruits present. Neurologic: Alert and oriented x3. Deep tendon reflexes symmetrical and normal.      Skin: Intact without suspicious lesions or rashes. Lymph: No cervical, axillary lymphadenopathy present. Psych: Mood and affect are normal. Normally interactive  Assessment & Plan:  #1 Medicare Wellness Exam; criteria met ; data entered #2 Problem List/Diagnoses reviewed Plan:  Assessments made/ Orders entered  

## 2013-09-21 NOTE — Addendum Note (Signed)
Addended by: Verdie Shire on: 09/21/2013 11:39 AM   Modules accepted: Orders

## 2013-09-23 ENCOUNTER — Encounter: Payer: Self-pay | Admitting: *Deleted

## 2013-09-25 LAB — VITAMIN D 1,25 DIHYDROXY
Vitamin D2 1, 25 (OH)2: <8 pg/mL
Vitamin D3 1, 25 (OH)2: 63 pg/mL

## 2013-09-27 ENCOUNTER — Encounter: Payer: Self-pay | Admitting: *Deleted

## 2013-10-13 ENCOUNTER — Telehealth: Payer: Self-pay | Admitting: Internal Medicine

## 2013-10-13 NOTE — Telephone Encounter (Signed)
Patient called and just had questions about her lab results.

## 2013-11-02 ENCOUNTER — Encounter: Payer: Self-pay | Admitting: Internal Medicine

## 2013-12-20 ENCOUNTER — Ambulatory Visit (INDEPENDENT_AMBULATORY_CARE_PROVIDER_SITE_OTHER): Payer: Medicare Other | Admitting: Pharmacist

## 2013-12-20 DIAGNOSIS — I4891 Unspecified atrial fibrillation: Secondary | ICD-10-CM

## 2013-12-20 LAB — BASIC METABOLIC PANEL
Calcium: 9.1 mg/dL (ref 8.4–10.5)
Chloride: 102 mEq/L (ref 96–112)
Creatinine, Ser: 0.9 mg/dL (ref 0.4–1.2)
Potassium: 4.6 mEq/L (ref 3.5–5.1)

## 2013-12-20 LAB — CBC
HCT: 44.4 % (ref 36.0–46.0)
RBC: 4.94 Mil/uL (ref 3.87–5.11)
WBC: 6.3 10*3/uL (ref 4.5–10.5)

## 2013-12-20 NOTE — Progress Notes (Signed)
Pt was started on Eliquis for Afib in May 2014.    Reviewed patients medication list.  Pt is not currently on any combined P-gp and strong CYP3A4 inhibitors/inducers (ketoconazole, traconazole, ritonavir, carbamazepine, phenytoin, rifampin, St. John's wort).  Reviewed labs.  SCr 0.9, Weight 55kg, Age 79 .  Dose appropriate based on CrCl.   Hgb and HCT Within Normal Limits  A full discussion of the nature of anticoagulants has been carried out.  A benefit/risk analysis has been presented to the patient, so that they understand the justification for choosing anticoagulation with Eliquis at this time.  The need for compliance is stressed.  Pt is aware to take the medication twice daily.  Side effects of potential bleeding are discussed, including unusual colored urine or stools, coughing up blood or coffee ground emesis, nose bleeds or serious fall or head trauma.  Discussed signs and symptoms of stroke. The patient should avoid any OTC items containing aspirin or ibuprofen.  Avoid alcohol consumption.   Call if any signs of abnormal bleeding.  Discussed financial obligations and resolved any difficulty in obtaining medication.  Next lab test test in 6 months.

## 2014-03-07 ENCOUNTER — Other Ambulatory Visit: Payer: Self-pay | Admitting: Dermatology

## 2014-03-09 ENCOUNTER — Encounter: Payer: Self-pay | Admitting: Internal Medicine

## 2014-03-09 DIAGNOSIS — C4442 Squamous cell carcinoma of skin of scalp and neck: Secondary | ICD-10-CM | POA: Insufficient documentation

## 2014-03-09 HISTORY — DX: Squamous cell carcinoma of skin of scalp and neck: C44.42

## 2014-04-07 ENCOUNTER — Emergency Department (HOSPITAL_COMMUNITY)
Admission: EM | Admit: 2014-04-07 | Discharge: 2014-04-08 | Disposition: A | Discharge status: Home / Self Care | Payer: Medicare Other | Attending: Emergency Medicine | Admitting: Emergency Medicine

## 2014-04-07 DIAGNOSIS — Z860100 Personal history of colon polyps, unspecified: Secondary | ICD-10-CM | POA: Insufficient documentation

## 2014-04-07 DIAGNOSIS — Z8673 Personal history of transient ischemic attack (TIA), and cerebral infarction without residual deficits: Secondary | ICD-10-CM | POA: Insufficient documentation

## 2014-04-07 DIAGNOSIS — H543 Unqualified visual loss, both eyes: Secondary | ICD-10-CM | POA: Insufficient documentation

## 2014-04-07 DIAGNOSIS — Z79899 Other long term (current) drug therapy: Secondary | ICD-10-CM | POA: Insufficient documentation

## 2014-04-07 DIAGNOSIS — E785 Hyperlipidemia, unspecified: Secondary | ICD-10-CM | POA: Insufficient documentation

## 2014-04-07 DIAGNOSIS — Z7901 Long term (current) use of anticoagulants: Secondary | ICD-10-CM | POA: Insufficient documentation

## 2014-04-07 DIAGNOSIS — E039 Hypothyroidism, unspecified: Secondary | ICD-10-CM | POA: Insufficient documentation

## 2014-04-07 DIAGNOSIS — I4891 Unspecified atrial fibrillation: Secondary | ICD-10-CM | POA: Insufficient documentation

## 2014-04-07 DIAGNOSIS — Z87891 Personal history of nicotine dependence: Secondary | ICD-10-CM | POA: Insufficient documentation

## 2014-04-07 DIAGNOSIS — K5900 Constipation, unspecified: Secondary | ICD-10-CM

## 2014-04-07 DIAGNOSIS — Z8742 Personal history of other diseases of the female genital tract: Secondary | ICD-10-CM | POA: Insufficient documentation

## 2014-04-07 DIAGNOSIS — Z792 Long term (current) use of antibiotics: Secondary | ICD-10-CM | POA: Insufficient documentation

## 2014-04-07 DIAGNOSIS — Z8781 Personal history of (healed) traumatic fracture: Secondary | ICD-10-CM | POA: Insufficient documentation

## 2014-04-07 DIAGNOSIS — Z8739 Personal history of other diseases of the musculoskeletal system and connective tissue: Secondary | ICD-10-CM | POA: Insufficient documentation

## 2014-04-08 ENCOUNTER — Encounter (HOSPITAL_COMMUNITY): Payer: Self-pay | Admitting: Emergency Medicine

## 2014-04-08 ENCOUNTER — Emergency Department (HOSPITAL_COMMUNITY): Payer: Medicare Other

## 2014-04-08 LAB — CBC WITH DIFFERENTIAL/PLATELET
Basophils Absolute: 0.1 10*3/uL (ref 0.0–0.1)
Basophils Relative: 2 % — ABNORMAL HIGH (ref 0–1)
EOS ABS: 0.2 10*3/uL (ref 0.0–0.7)
Eosinophils Relative: 3 % (ref 0–5)
HCT: 45.1 % (ref 36.0–46.0)
Hemoglobin: 14.7 g/dL (ref 12.0–15.0)
LYMPHS PCT: 28 % (ref 12–46)
Lymphs Abs: 1.8 10*3/uL (ref 0.7–4.0)
MCH: 29 pg (ref 26.0–34.0)
MCHC: 32.6 g/dL (ref 30.0–36.0)
MCV: 89 fL (ref 78.0–100.0)
Monocytes Absolute: 0.7 10*3/uL (ref 0.1–1.0)
Monocytes Relative: 11 % (ref 3–12)
Neutro Abs: 3.7 10*3/uL (ref 1.7–7.7)
Neutrophils Relative %: 57 % (ref 43–77)
PLATELETS: 222 10*3/uL (ref 150–400)
RBC: 5.07 MIL/uL (ref 3.87–5.11)
RDW: 13.8 % (ref 11.5–15.5)
WBC: 6.4 10*3/uL (ref 4.0–10.5)

## 2014-04-08 LAB — URINE MICROSCOPIC-ADD ON

## 2014-04-08 LAB — COMPREHENSIVE METABOLIC PANEL
ALBUMIN: 4.1 g/dL (ref 3.5–5.2)
ALT: 24 U/L (ref 0–35)
AST: 36 U/L (ref 0–37)
Alkaline Phosphatase: 53 U/L (ref 39–117)
BUN: 14 mg/dL (ref 6–23)
CALCIUM: 9.6 mg/dL (ref 8.4–10.5)
CO2: 27 meq/L (ref 19–32)
Chloride: 97 mEq/L (ref 96–112)
Creatinine, Ser: 0.92 mg/dL (ref 0.50–1.10)
GFR calc Af Amer: 64 mL/min — ABNORMAL LOW (ref >90)
GFR calc non Af Amer: 55 mL/min — ABNORMAL LOW (ref >90)
Glucose, Bld: 112 mg/dL — ABNORMAL HIGH (ref 70–99)
Potassium: 4.7 mEq/L (ref 3.7–5.3)
SODIUM: 136 meq/L — AB (ref 137–147)
TOTAL PROTEIN: 7 g/dL (ref 6.0–8.3)
Total Bilirubin: 0.6 mg/dL (ref 0.3–1.2)

## 2014-04-08 LAB — URINALYSIS, ROUTINE W REFLEX MICROSCOPIC
Bilirubin Urine: NEGATIVE
GLUCOSE, UA: NEGATIVE mg/dL
HGB URINE DIPSTICK: NEGATIVE
Ketones, ur: NEGATIVE mg/dL
Nitrite: NEGATIVE
Protein, ur: NEGATIVE mg/dL
SPECIFIC GRAVITY, URINE: 1.014 (ref 1.005–1.030)
Urobilinogen, UA: 0.2 mg/dL (ref 0.0–1.0)
pH: 7 (ref 5.0–8.0)

## 2014-04-08 LAB — LIPASE, BLOOD: Lipase: 29 U/L (ref 11–59)

## 2014-04-08 MED ORDER — SODIUM CHLORIDE 0.9 % IV SOLN
INTRAVENOUS | Status: DC
  Administered 2014-04-08: 01:00:00 via INTRAVENOUS

## 2014-04-08 MED ORDER — IOHEXOL 300 MG/ML  SOLN
100.0000 mL | Freq: Once | INTRAMUSCULAR | Status: AC | PRN
  Administered 2014-04-08: 100 mL via INTRAVENOUS

## 2014-04-08 MED ORDER — ONDANSETRON HCL 4 MG/2ML IJ SOLN
4.0000 mg | Freq: Once | INTRAMUSCULAR | Status: AC
  Administered 2014-04-08: 4 mg via INTRAVENOUS
  Filled 2014-04-08: qty 2

## 2014-04-08 MED ORDER — IOHEXOL 300 MG/ML  SOLN
50.0000 mL | Freq: Once | INTRAMUSCULAR | Status: AC | PRN
  Administered 2014-04-08: 50 mL via ORAL

## 2014-04-08 MED ORDER — FENTANYL CITRATE 0.05 MG/ML IJ SOLN
50.0000 ug | Freq: Once | INTRAMUSCULAR | Status: AC
  Administered 2014-04-08: 50 ug via INTRAVENOUS
  Filled 2014-04-08: qty 2

## 2014-04-08 NOTE — ED Notes (Signed)
Pt unable to void at this time. 

## 2014-04-08 NOTE — ED Provider Notes (Signed)
CSN: [id number]     Arrival date & time 04/07/14  2356 History   First MD Initiated Contact with Patient 04/08/14 0016     Chief Complaint  Patient presents with  . Abdominal Pain     (Consider location/radiation/quality/duration/timing/severity/associated sxs/prior Treatment) HPI This is an 80 year old female who developed abdominal pain yesterday evening. The pain is worse in the sense. It is located in the epigastrium and radiates down to the suprapubic region. She describes it as sharp. She describes it as moderate to severe. It is worse with movement or palpation. She has had nausea but no vomiting or diarrhea. She has chronic constipation that has not acutely changed. She denies urinary changes. She denies fever or chills. She is blind in the left eye.  Past Medical History  Diagnosis Date  . Hx of colonic polyps     last colonoscopy 2005, repeat was due 2010 Dr. Sharlett Iles  . Hyperlipidemia   . Osteoporosis   . Atrial fibrillation     Dr Percival Spanish  . Cerebrovascular accident 01/09  . Fracture 1995, 2009    RUE; LUE Dr. Durward Fortes  . 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 Hyperlipidemia   . Hypothyroidism   . Cystitis   . Complication of anesthesia     difficulty with arousal post op   Past Surgical History  Procedure Laterality Date  . Tonsillectomy and adenoidectomy    . Vocal cord polyps    . Thyroid needle biopsy  2002  . Appendectomy    . Cataract extraction      Bilat  . Colonoscopy w/ polypectomy  2005    Diverticulosis;Dr. Sharlett Iles  . Total hip arthroplasty  2008    CHF & RAF post op   . Total shoulder replacement  2010    Dr Durward Fortes - left shoulder  . Mohs surgery  2013    nasal Basal Cell; Dr Sarajane Jews  . Excision metacarpal mass Right 08/17/2013    Procedure: RIGHT LONG EXCISION MASS AND DIP JOINT DEBRIDEMENT;  Surgeon: Tennis Must, MD;  Location: [facility];  Service: Orthopedics;  Laterality: Right;   Family  History  Problem Relation Age of Onset  . Stroke Father     > 27  . Diabetes Father   . Leukemia Mother   . Cancer Mother     leukemia  . Colon cancer Brother     Valve replacement  . Heart attack Brother   . Breast cancer Sister   . Ovarian cancer Sister   . Lung cancer Sister     smoker  . Prostate cancer Brother   . Cancer Brother     bladder   History  Substance Use Topics  . Smoking status: Former Smoker    Quit date: 12/23/1993  . Smokeless tobacco: Not on file     Comment: Smoked 1950-1995, up to 1 ppd  . Alcohol Use: No   OB History   Grav Para Term Preterm Abortions TAB SAB Ect Mult Living                 Review of Systems  All other systems reviewed and are negative.   Allergies  Desmopressin acetate; Dorzolamide hcl; Hydrocodone-acetaminophen; Levofloxacin; and Ciprofloxacin  Home Medications   Prior to Admission medications   Medication Sig Start Date End Date Taking? Authorizing Provider  amoxicillin (AMOXIL) 500 MG capsule 4 pills 30-60 minutes pre-dental procedure 08/10/12   Hendricks Limes,  MD  apixaban (ELIQUIS) 2.5 MG TABS tablet Take 1 tablet (2.5 mg total) by mouth 2 (two) times daily. 06/22/13   Minus Breeding, MD  B Complex Vitamins (VITAMIN B COMPLEX) TABS Take by mouth.    Historical Provider, MD  bimatoprost (LUMIGAN) 0.03 % ophthalmic solution Place 1 drop into both eyes at bedtime.     Historical Provider, MD  Calcium Carbonate-Vitamin D (CALCIUM-VITAMIN D) 500-200 MG-UNIT per tablet Take 1 tablet by mouth daily.     Historical Provider, MD  cephALEXin (KEFLEX) 250 MG capsule Take 250 mg by mouth every other day.     Historical Provider, MD  Cholecalciferol (VITAMIN D) 2000 UNITS tablet Take 2,000 Units by mouth daily.    Historical Provider, MD  Cranberry (CRANBERRY CONCENTRATE) 500 MG CAPS Take 2 capsules by mouth daily.      Historical Provider, MD  digoxin (LANOXIN) 0.125 MG tablet TAKE ONE TABLET BY MOUTH ONCE DAILY 08/11/13   Minus Breeding, MD  diltiazem (CARDIZEM CD) 120 MG 24 hr capsule TAKE (1) CAPSULE DAILY. 07/06/13   Minus Breeding, MD  diphenhydramine-acetaminophen (TYLENOL PM) 25-500 MG TABS as needed. Infrequently    Historical Provider, MD  ENSURE PLUS (ENSURE PLUS) LIQD Take 1 Can by mouth daily. 09/21/13   Hendricks Limes, MD  estradiol (ESTRACE) 0.1 MG/GM vaginal cream Place 2 g vaginally. Twice weekly    Historical Provider, MD  ezetimibe-simvastatin (VYTORIN) 10-40 MG per tablet TAKE 1 TABLET ONCE DAILY. 09/21/13   Hendricks Limes, MD  levothyroxine (SYNTHROID, LEVOTHROID) 50 MCG tablet TAKE ONE TABLET BY MOUTH EVERY DAY 09/21/13   Hendricks Limes, MD  LORazepam (ATIVAN) 1 MG tablet TAKE 1 TABLET EVERY 8-12 HOURS AS NEEDED. CAN BE HABIT FORMING & MAY AFFECT MENTAL ALERTNESS. 09/21/13   Hendricks Limes, MD  Magnesium Oxide 500 MG TABS Take 1 tablet by mouth daily.      Historical Provider, MD  Multiple Vitamin (MULTIVITAMIN) tablet Take 1 tablet by mouth daily. Super Multivit    Historical Provider, MD  Probiotic Product (MISC INTESTINAL FLORA REGULAT) CAPS Take 1 capsule by mouth daily.      Historical Provider, MD  timolol (TIMOPTIC) 0.5 % ophthalmic solution Place 1 drop into both eyes daily.     Historical Provider, MD   BP 178/113  Pulse 86  Temp(Src) 98.3 F (36.8 C) (Oral)  Resp 20  SpO2 95%  Physical Exam General: Well-developed, well-nourished female in no acute distress; appearance consistent with age of record HENT: normocephalic; atraumatic Eyes: Right pupil round and reactive to light; left ptosis; dilated, irregular left pupil with corneal opacity Neck: supple Heart: regular rate and rhythm Lungs: clear to auscultation bilaterally Abdomen: soft; nondistended; epigastric, suprapubic and RLQ tenderness; no masses or hepatosplenomegaly; bowel sounds present Extremities: No deformity; full range of motion; pulses normal Neurologic: Awake, alert and oriented; motor function intact in all  extremities and symmetric; no facial droop Skin: Warm and dry Psychiatric: Normal mood and affect    ED Course  Procedures (including critical care time)    MDM   Nursing notes and vitals signs, including pulse oximetry, reviewed.  Summary of this visit's results, reviewed by myself:  Labs:  Results for orders placed during the hospital encounter of 04/07/14 (from the past 24 hour(s))  CBC WITH DIFFERENTIAL     Status: Abnormal   Collection Time    04/08/14 12:35 AM      Result Value Ref Range   WBC 6.4  4.0 - 10.5 K/uL   RBC 5.07  3.87 - 5.11 MIL/uL   Hemoglobin 14.7  12.0 - 15.0 g/dL   HCT 45.1  36.0 - 46.0 %   MCV 89.0  78.0 - 100.0 fL   MCH 29.0  26.0 - 34.0 pg   MCHC 32.6  30.0 - 36.0 g/dL   RDW 13.8  11.5 - 15.5 %   Platelets 222  150 - 400 K/uL   Neutrophils Relative % 57  43 - 77 %   Neutro Abs 3.7  1.7 - 7.7 K/uL   Lymphocytes Relative 28  12 - 46 %   Lymphs Abs 1.8  0.7 - 4.0 K/uL   Monocytes Relative 11  3 - 12 %   Monocytes Absolute 0.7  0.1 - 1.0 K/uL   Eosinophils Relative 3  0 - 5 %   Eosinophils Absolute 0.2  0.0 - 0.7 K/uL   Basophils Relative 2 (*) 0 - 1 %   Basophils Absolute 0.1  0.0 - 0.1 K/uL  COMPREHENSIVE METABOLIC PANEL     Status: Abnormal   Collection Time    04/08/14 12:35 AM      Result Value Ref Range   Sodium 136 (*) 137 - 147 mEq/L   Potassium 4.7  3.7 - 5.3 mEq/L   Chloride 97  96 - 112 mEq/L   CO2 27  19 - 32 mEq/L   Glucose, Bld 112 (*) 70 - 99 mg/dL   BUN 14  6 - 23 mg/dL   Creatinine, Ser 0.92  0.50 - 1.10 mg/dL   Calcium 9.6  8.4 - 10.5 mg/dL   Total Protein 7.0  6.0 - 8.3 g/dL   Albumin 4.1  3.5 - 5.2 g/dL   AST 36  0 - 37 U/L   ALT 24  0 - 35 U/L   Alkaline Phosphatase 53  39 - 117 U/L   Total Bilirubin 0.6  0.3 - 1.2 mg/dL   GFR calc non Af Amer 55 (*) >90 mL/min   GFR calc Af Amer 64 (*) >90 mL/min  LIPASE, BLOOD     Status: None   Collection Time    04/08/14 12:35 AM      Result Value Ref Range   Lipase 29   11 - 59 U/L    Imaging Studies: Ct Abdomen Pelvis W Contrast  04/08/2014   CLINICAL DATA:  Upper abdominal pain  EXAM: CT ABDOMEN AND PELVIS WITH CONTRAST  TECHNIQUE: Multidetector CT imaging of the abdomen and pelvis was performed using the standard protocol following bolus administration of intravenous contrast.  CONTRAST:  129mL OMNIPAQUE IOHEXOL 300 MG/ML  SOLN  COMPARISON:  CT PELVIS W/CM dated 11/04/2006; CT ABD W/CM dated 11/04/2006; DG LUMBAR SPINE COMPLETE dated 01/02/2006  FINDINGS: There is bibasilar interstitial fibrosis.  There are punctate calcifications scattered within the liver likely representing sequela of prior granulomatous disease. There is a tiny amount of perihepatic fluid. There is no intrahepatic or extrahepatic biliary ductal dilatation. The gallbladder is normal. The spleen demonstrates numerous small calcifications likely reflecting sequela of prior granulomatous disease. . The kidneys, adrenal glands and pancreas are normal. The bladder is distended.  The stomach, duodenum, small intestine, and large intestine demonstrate no contrast extravasation or dilatation. There is diverticulosis without evidence of diverticulitis. There is a large amount of stool in the ascending colon. There is no pneumoperitoneum, pneumatosis, or portal venous gas. There is a small amount of pelvic free fluid. There is no lymphadenopathy.  The abdominal aorta is normal in caliber with atherosclerosis.  There are no lytic or sclerotic osseous lesions. There is severe degenerative disc disease at L5-S1. There is bilateral facet arthropathy throughout the lumbar spine. There is a right total hip arthroplasty.  IMPRESSION: 1. Diverticulosis without evidence of diverticulitis. Large amount of stool in the ascending colon. 2. Lumbar spine spondylosis.   Electronically Signed   By: Kathreen Devoid   On: 04/08/2014 04:23   4:39 AM Abdomen soft and nontender. Patient's symptoms likely due to distended cecum filled  with stool. She was advised to try MiraLax for the next few days. She doesn't knowledge that she has a chronic constipation problem for which she takes magnesium hydroxide tablets.     Wynetta Fines, MD 04/08/14 [phone]

## 2014-04-08 NOTE — ED Notes (Signed)
Patient resting in position of comfort with eyes closed RR WNL--even and unlabored with equal rise and fall of chest Patient in NAD Side rails up, call bell in reach  

## 2014-04-08 NOTE — Discharge Instructions (Signed)

## 2014-04-08 NOTE — ED Notes (Signed)
Patient transported to CT 

## 2014-04-08 NOTE — ED Notes (Signed)
Pt reports upper mid ab pain that goes down to pelvic area that started this evening and has not gotten any better. Pt states that she has nausea without vomiting or diarrhea. Pt alert and sitting in wheelchair in triage area.

## 2014-04-08 NOTE — ED Notes (Signed)
Patient now awake Patient reports complete relief of pain s/p admin of medication, see MAR Patient informed of POC to include CT scan--agrees and v/u Patient denies further needs at this time Side rails up, call bell in reach

## 2014-05-17 ENCOUNTER — Encounter: Payer: Self-pay | Admitting: Cardiology

## 2014-05-17 ENCOUNTER — Ambulatory Visit (INDEPENDENT_AMBULATORY_CARE_PROVIDER_SITE_OTHER): Payer: Medicare Other | Admitting: Cardiology

## 2014-05-17 VITALS — BP 106/55 | HR 65 | Ht 68.0 in | Wt 126.0 lb

## 2014-05-17 DIAGNOSIS — I10 Essential (primary) hypertension: Secondary | ICD-10-CM

## 2014-05-17 DIAGNOSIS — I6529 Occlusion and stenosis of unspecified carotid artery: Secondary | ICD-10-CM

## 2014-05-17 DIAGNOSIS — I4891 Unspecified atrial fibrillation: Secondary | ICD-10-CM

## 2014-05-17 DIAGNOSIS — R1310 Dysphagia, unspecified: Secondary | ICD-10-CM

## 2014-05-17 DIAGNOSIS — R011 Cardiac murmur, unspecified: Secondary | ICD-10-CM

## 2014-05-17 NOTE — Progress Notes (Signed)
HPI The patient presents for followup of atrial fibrillation.   Since I last saw her she has had no new complaints. She doesn't really notice palpitations and she's had no presyncope or syncope. She has had no chest pressure, neck or arm discomfort. She's had no weight gain and continues to have some mild ankle edema which is unchanged.  She does exercise routinely.  She has had no new SOB/PND.  She does have a cough which happens mostly when she sits down to the.  Allergies  Allergen Reactions  . Desmopressin Acetate     REACTION: swelling feet  . Dorzolamide Hcl Other (See Comments)    Burning in eyes and redness  . Hydrocodone-Acetaminophen Nausea And Vomiting    dizzy  . Levofloxacin     REACTION: INTERFERS WITH COUMADIN  . Ciprofloxacin     REACTION: vaginal infection    Current Outpatient Prescriptions  Medication Sig Dispense Refill  . amoxicillin (AMOXIL) 500 MG capsule 4 pills 30-60 minutes pre-dental procedure  20 capsule  0  . apixaban (ELIQUIS) 2.5 MG TABS tablet Take 1 tablet (2.5 mg total) by mouth 2 (two) times daily.  60 tablet  11  . B Complex Vitamins (VITAMIN B COMPLEX) TABS Take by mouth.      . bimatoprost (LUMIGAN) 0.03 % ophthalmic solution Place 1 drop into both eyes at bedtime.       . Calcium Carbonate-Vitamin D (CALCIUM-VITAMIN D) 500-200 MG-UNIT per tablet Take 1 tablet by mouth daily.       . Cholecalciferol (VITAMIN D) 2000 UNITS tablet Take 2,000 Units by mouth daily.      . Cranberry (CRANBERRY CONCENTRATE) 500 MG CAPS Take 2 capsules by mouth daily.        . digoxin (LANOXIN) 0.125 MG tablet Take 0.125 mg by mouth daily.      Marland Kitchen diltiazem (CARDIZEM CD) 120 MG 24 hr capsule Take 120 mg by mouth daily.      . diphenhydramine-acetaminophen (TYLENOL PM) 25-500 MG TABS as needed. Infrequently      . ENSURE PLUS (ENSURE PLUS) LIQD Take 1 Can by mouth daily.  24 Can  11  . estradiol (ESTRACE) 0.1 MG/GM vaginal cream Place 2 g vaginally. Twice weekly      .  ezetimibe-simvastatin (VYTORIN) 10-40 MG per tablet Take 1 tablet by mouth daily.      Marland Kitchen levothyroxine (SYNTHROID, LEVOTHROID) 50 MCG tablet Take 50 mcg by mouth daily before breakfast.      . LORazepam (ATIVAN) 1 MG tablet Take 0.5 mg by mouth every 8 (eight) hours as needed for anxiety or sleep.      . Magnesium Oxide 500 MG TABS Take 1 tablet by mouth at bedtime.       . Multiple Vitamin (MULTIVITAMIN) tablet Take 1 tablet by mouth daily. Super Multivit      . timolol (TIMOPTIC) 0.5 % ophthalmic solution Place 1 drop into both eyes 2 (two) times daily.       . Probiotic Product (MISC INTESTINAL FLORA REGULAT) CAPS Take 1 capsule by mouth daily.         No current facility-administered medications for this visit.    Past Medical History  Diagnosis Date  . Hx of colonic polyps     last colonoscopy 2005, repeat was due 2010 Dr. Sharlett Iles  . Hyperlipidemia   . Osteoporosis   . Atrial fibrillation     Dr Percival Spanish  . Cerebrovascular accident 01/09  . Fracture 1995, 2009  RUE; LUE Dr. Durward Fortes  . 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 Hyperlipidemia   . Hypothyroidism   . Cystitis   . Complication of anesthesia     difficulty with arousal post op    Past Surgical History  Procedure Laterality Date  . Tonsillectomy and adenoidectomy    . Vocal cord polyps    . Thyroid needle biopsy  2002  . Appendectomy    . Cataract extraction      Bilat  . Colonoscopy w/ polypectomy  2005    Diverticulosis;Dr. Sharlett Iles  . Total hip arthroplasty  2008    CHF & RAF post op   . Total shoulder replacement  2010    Dr Durward Fortes - left shoulder  . Mohs surgery  2013    nasal Basal Cell; Dr Sarajane Jews  . Excision metacarpal mass Right 08/17/2013    Procedure: RIGHT LONG EXCISION MASS AND DIP JOINT DEBRIDEMENT;  Surgeon: Tennis Must, MD;  Location: [facility];  Service: Orthopedics;  Laterality: Right;   ROS:  Left eye blindness and decreased gait.  As stated  in the HPI and negative for all other systems.  PHYSICAL EXAM BP 106/55  Pulse 65  Ht 5\' 8"  (1.727 m)  Wt 126 lb (57.153 kg)  BMI 19.16 kg/m2 GENERAL:  Well appearing HEENT: Anisocoria, fundi not visualized, oral mucosa unremarkable, mild facial droop NECK:  No jugular venous distention, waveform within normal limits, carotid upstroke brisk and symmetric, right bruit vs transmitted murmur, no thyromegaly LUNGS:  Clear to auscultation bilaterally BACK:  No CVA tenderness CHEST:  Unremarkable HEART:  PMI not displaced or sustained,S1 and S2 within normal limits, no D5,HG clicks, no rubs, apical systolic early to mid peaking murmur, irregular ABD:  Flat, positive bowel sounds normal in frequency in pitch, no bruits, no rebound, no guarding, no midline pulsatile mass, no hepatomegaly, no splenomegaly EXT:  2 plus pulses upper and left DP/PT, diminished PT/DP right, mild edema, no cyanosis no clubbing NEURO:  Cranial nerves II through XII grossly intact other than slight facial droop, motor grossly intact throughout  EKG:  Atrial fibrillation, rate 65, right axis deviation, no acute ST-T wave changes, nonspecific T-wave flattening. 05/17/2014  ASSESSMENT AND PLAN  ATRIAL FIBRILLATION:  The patient tolerates atrial fibrillation. She requests transition to Eliquis which will need to be dose adjusted for her age and weight.  CAROTID STENOSIS:  This is followed by Dr. Leonie Man.  MURMUR:  This is slightly progressive.  I will check an echo as we have not seen one since 2009.  I suspect that this is worsening aortic sclerosis.  COUGH:  I suspect aspiration.  I have referred her for speech swallow evaluation.

## 2014-05-17 NOTE — Patient Instructions (Addendum)
The current medical regimen is effective;  continue present plan and medications.  Your physician has requested that you have an echocardiogram. Echocardiography is a painless test that uses sound waves to create images of your heart. It provides your doctor with information about the size and shape of your heart and how well your heart's chambers and valves are working. This procedure takes approximately one hour. There are no restrictions for this procedure.  You have been referred to be evaluated by speech therapy for swallowing difficulty.  Follow up in 1 year with Dr Percival Spanish.  You will receive a letter in the mail 2 months before you are due.  Please call us when you receive this letter to schedule your follow up appointment.

## 2014-05-19 ENCOUNTER — Other Ambulatory Visit: Payer: Self-pay | Admitting: *Deleted

## 2014-05-19 DIAGNOSIS — R1310 Dysphagia, unspecified: Secondary | ICD-10-CM

## 2014-05-20 ENCOUNTER — Other Ambulatory Visit: Payer: Self-pay | Admitting: *Deleted

## 2014-05-20 DIAGNOSIS — R1310 Dysphagia, unspecified: Secondary | ICD-10-CM

## 2014-05-25 ENCOUNTER — Ambulatory Visit (HOSPITAL_COMMUNITY)
Admission: RE | Admit: 2014-05-25 | Discharge: 2014-05-25 | Disposition: A | Discharge status: Home / Self Care | Payer: Medicare Other | Source: Ambulatory Visit | Attending: Cardiology | Admitting: Cardiology

## 2014-05-25 DIAGNOSIS — K222 Esophageal obstruction: Secondary | ICD-10-CM | POA: Insufficient documentation

## 2014-05-25 DIAGNOSIS — R1310 Dysphagia, unspecified: Secondary | ICD-10-CM | POA: Insufficient documentation

## 2014-05-25 DIAGNOSIS — K224 Dyskinesia of esophagus: Secondary | ICD-10-CM | POA: Insufficient documentation

## 2014-05-30 ENCOUNTER — Telehealth: Payer: Self-pay | Admitting: *Deleted

## 2014-05-30 ENCOUNTER — Other Ambulatory Visit: Payer: Self-pay | Admitting: *Deleted

## 2014-05-30 DIAGNOSIS — R1310 Dysphagia, unspecified: Secondary | ICD-10-CM

## 2014-05-30 NOTE — Telephone Encounter (Signed)
Pt is aware of appointment for modified barium swallow Horton Chin RN

## 2014-05-30 NOTE — Progress Notes (Signed)
Error Horton Chin RN

## 2014-05-30 NOTE — Telephone Encounter (Signed)
Speech language therapist, Kirke Shaggy, called this morning b/c pt had had a regular barium swallow last week instead of a modified barium swallow to r/o aspiration.  Spoke with Abigail Butts in radiology who helped me put in the order for this  ( slp [id number]) It is scheduled for 06/07/14 at 11:00AM   Pt needs to be at the first floor radiology desk by 10:45 am  Left message for Glendell Docker about arranging speech eval after the swallow.  Left message for pt to call back  Horton Chin RN

## 2014-05-30 NOTE — Telephone Encounter (Signed)
F/u ° ° ° °Pt returning a call from nurse. °

## 2014-05-31 ENCOUNTER — Telehealth: Payer: Self-pay | Admitting: Cardiology

## 2014-05-31 NOTE — Telephone Encounter (Signed)
New message    Patient modified barium swallow is schedule for  June  16 . If the patient need to have outpatient therapy for barium swallow the  recommendation will be made.

## 2014-06-03 ENCOUNTER — Other Ambulatory Visit (HOSPITAL_COMMUNITY): Payer: Medicare Other

## 2014-06-03 ENCOUNTER — Other Ambulatory Visit (HOSPITAL_COMMUNITY): Payer: Self-pay | Admitting: Cardiology

## 2014-06-03 DIAGNOSIS — R1310 Dysphagia, unspecified: Secondary | ICD-10-CM

## 2014-06-06 ENCOUNTER — Ambulatory Visit (HOSPITAL_COMMUNITY)
Admission: RE | Admit: 2014-06-06 | Discharge: 2014-06-06 | Disposition: A | Discharge status: Home / Self Care | Payer: Medicare Other | Source: Ambulatory Visit | Attending: Cardiovascular Disease | Admitting: Cardiovascular Disease

## 2014-06-06 DIAGNOSIS — I319 Disease of pericardium, unspecified: Secondary | ICD-10-CM

## 2014-06-06 DIAGNOSIS — I4891 Unspecified atrial fibrillation: Secondary | ICD-10-CM | POA: Insufficient documentation

## 2014-06-06 DIAGNOSIS — R011 Cardiac murmur, unspecified: Secondary | ICD-10-CM

## 2014-06-06 NOTE — Progress Notes (Signed)
2D Echo Performed 06/06/2014    Marygrace Drought, RCS

## 2014-06-07 ENCOUNTER — Ambulatory Visit (HOSPITAL_COMMUNITY)
Admission: RE | Admit: 2014-06-07 | Discharge: 2014-06-07 | Disposition: A | Discharge status: Home / Self Care | Payer: Medicare Other | Source: Ambulatory Visit | Attending: Cardiology | Admitting: Cardiology

## 2014-06-07 DIAGNOSIS — R1310 Dysphagia, unspecified: Secondary | ICD-10-CM | POA: Insufficient documentation

## 2014-06-07 NOTE — Procedures (Signed)
Objective Swallowing Evaluation: Modified Barium Swallowing Study  Patient Details  Name: Kristine Simmons MRN: [id number] Date of Birth: 1927-02-19  Today's Date: 06/07/2014 Time: 1100-1130 SLP Time Calculation (min): 30 min  Past Medical History:  Past Medical History  Diagnosis Date  . Hx of colonic polyps     last colonoscopy 2005, repeat was due 2010 Dr. Sharlett Iles  . Hyperlipidemia   . Osteoporosis   . Atrial fibrillation     Dr Percival Spanish  . Cerebrovascular accident 01/09  . Fracture 1995, 2009    RUE; LUE Dr. Durward Fortes  . 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 Hyperlipidemia   . Hypothyroidism   . Cystitis   . Complication of anesthesia     difficulty with arousal post op   Past Surgical History:  Past Surgical History  Procedure Laterality Date  . Tonsillectomy and adenoidectomy    . Vocal cord polyps    . Thyroid needle biopsy  2002  . Appendectomy    . Cataract extraction      Bilat  . Colonoscopy w/ polypectomy  2005    Diverticulosis;Dr. Sharlett Iles  . Total hip arthroplasty  2008    CHF & RAF post op   . Total shoulder replacement  2010    Dr Durward Fortes - left shoulder  . Mohs surgery  2013    nasal Basal Cell; Dr Sarajane Jews  . Excision metacarpal mass Right 08/17/2013    Procedure: RIGHT LONG EXCISION MASS AND DIP JOINT DEBRIDEMENT;  Surgeon: Tennis Must, MD;  Location: [facility];  Service: Orthopedics;  Laterality: Right;   HPI:  80 y.o. female with hx of CVA in 2009 and prior dysphagia, chronic right CN VII asymmetry, jaw pain/injury s/p fall, referred for OPMBS due to frequent coughing in conjunction with meals. Pt describes frequent coughing with breakfast, in particular; increased phlegm production and 25 lb weight loss over the last three years.  Remote hx of vocal cord polyps.  Esophagram on 05/20/14: severe dysmotility with disruption of primary esophageal peristaltic waves;  extensive tertiary contractions.      Assessment / Plan / Recommendation Clinical Impression  Dysphagia Diagnosis: Mild pharyngeal phase dysphagia; primary esophageal dysphagia  Clinical impression: Pt presents with a primary esophageal dysphagia, with MBS results supporting findings from recent esophagram (severe motility disorder).  Pt with normal oral phase, mild pharyngeal phase deficits with intermittent penetration of thin liquids to the level of the vocal cords.  There may have been one incident of trace aspiration, but flouroscopy did not capture event.   Penetration elicits protective cough which acts to eject penetrate from larynx.    Importantly, screen of cervical esophagus revealed barium stasis which filled entire column to level of cervical esophagus - regardless of PO consistency.  Prominent cricopharyngeus and small Zenker's diverticulum was also identified.   Reviewed video of study with pt and husband; provided with handouts re: strategies to manage esophageal deficits. No SLP f/u is necessary; rather, recommend consideration of GI consult given findings of esophagram and 25 lb weight loss.      Treatment Recommendation  No treatment recommended at this time    Diet Recommendation Regular;Thin liquid   Liquid Administration via: Cup;Straw Medication Administration: Whole meds with liquid Supervision: Patient able to self feed Compensations: Slow rate;Small sips/bites;Follow solids with liquid Postural Changes and/or Swallow Maneuvers: Upright 30-60 min after meal;Seated upright 90 degrees    Other  Recommendations Recommended Consults: Consider GI  evaluation Oral Care Recommendations: Oral care BID   Follow Up Recommendations  None        General HPI: 80 y.o. female with hx of CVA in 2009 and prior dysphagia, chronic right CN VII asymmetry, jaw pain/injury s/p fall, referred for OPMBS due to frequent coughing in conjunction with meals. Pt describes frequent coughing with breakfast, in particular;  increased phlegm production and 25 lb weight loss over the last three years.  Remote hx of vocal cord polyps.  Esophagram on 05/20/14: severe dysmotility with disruption of primary esophageal peristaltic waves;  extensive tertiary contractions. Type of Study: Modified Barium Swallowing Study Reason for Referral: Objectively evaluate swallowing function Previous Swallow Assessment: 2009 s/p CVA.  Results not available Diet Prior to this Study: Regular;Thin liquids Temperature Spikes Noted: No Respiratory Status: Room air History of Recent Intubation: No Behavior/Cognition: Alert;Cooperative;Pleasant mood Oral Cavity - Dentition: Adequate natural dentition Oral Motor / Sensory Function:  (impaired lower CN VII on right) Self-Feeding Abilities: Able to feed self Patient Positioning: Upright in chair Baseline Vocal Quality: Clear Volitional Cough: Strong Volitional Swallow: Able to elicit Anatomy:  (presence of osteophytes along cervical column; noted also in esophagram) Pharyngeal Secretions: Not observed secondary MBS    Reason for Referral Objectively evaluate swallowing function   Oral Phase Oral Preparation/Oral Phase Oral Phase: WFL   Pharyngeal Phase Pharyngeal Phase Pharyngeal Phase: Impaired Pharyngeal - Thin Pharyngeal - Thin Cup: Reduced airway/laryngeal closure;Penetration/Aspiration during swallow;Trace aspiration Penetration/Aspiration details (thin cup): Material enters airway, CONTACTS cords then ejected out  Cervical Esophageal Phase    GO    Cervical Esophageal Phase Cervical Esophageal Phase: Impaired Cervical Esophageal Phase - Comment Cervical Esophageal Comment: presence of small Zenker's diverticulum; barium stasis reaches cervical esophagus for all consistencies    Functional Assessment Tool Used: clinical judgement Functional Limitations: Swallowing Swallow Current Status (F7494): At least 1 percent but less than 20 percent impaired, limited or  restricted Swallow Goal Status [phone]): At least 1 percent but less than 20 percent impaired, limited or restricted Swallow Discharge Status [phone]): At least 1 percent but less than 20 percent impaired, limited or restricted   Amanda L. Tivis Ringer, [place] CCC/SLP Pager [phone]  Juan Quam Laurice 06/07/2014, 11:48 AM

## 2014-06-28 ENCOUNTER — Other Ambulatory Visit: Payer: Self-pay | Admitting: *Deleted

## 2014-06-28 DIAGNOSIS — R933 Abnormal findings on diagnostic imaging of other parts of digestive tract: Secondary | ICD-10-CM

## 2014-07-04 ENCOUNTER — Other Ambulatory Visit (HOSPITAL_COMMUNITY): Payer: Self-pay | Admitting: Cardiology

## 2014-07-11 ENCOUNTER — Telehealth: Payer: Self-pay | Admitting: Cardiology

## 2014-07-11 NOTE — Telephone Encounter (Signed)
New message    Patient calling stating pam called her with test results. & referral. Patient stated message was not clear.

## 2014-07-11 NOTE — Telephone Encounter (Signed)
Pt aware she was referred to speech pathology to further access swallowing function and how to prevent choking episodes.

## 2014-07-15 ENCOUNTER — Other Ambulatory Visit (HOSPITAL_COMMUNITY): Payer: Self-pay | Admitting: Cardiology

## 2014-07-29 ENCOUNTER — Ambulatory Visit: Payer: Medicare Other

## 2014-08-14 ENCOUNTER — Inpatient Hospital Stay (HOSPITAL_COMMUNITY)
Admission: EM | Admit: 2014-08-14 | Discharge: 2014-08-21 | DRG: 351 | Disposition: A | Discharge status: Home Health | Payer: Medicare Other | Attending: General Surgery | Admitting: General Surgery

## 2014-08-14 ENCOUNTER — Emergency Department (HOSPITAL_COMMUNITY): Payer: Medicare Other

## 2014-08-14 ENCOUNTER — Encounter (HOSPITAL_COMMUNITY): Payer: Self-pay | Admitting: Emergency Medicine

## 2014-08-14 DIAGNOSIS — E039 Hypothyroidism, unspecified: Secondary | ICD-10-CM | POA: Diagnosis present

## 2014-08-14 DIAGNOSIS — K4030 Unilateral inguinal hernia, with obstruction, without gangrene, not specified as recurrent: Secondary | ICD-10-CM | POA: Diagnosis present

## 2014-08-14 DIAGNOSIS — Z8249 Family history of ischemic heart disease and other diseases of the circulatory system: Secondary | ICD-10-CM

## 2014-08-14 DIAGNOSIS — J4489 Other specified chronic obstructive pulmonary disease: Secondary | ICD-10-CM | POA: Diagnosis present

## 2014-08-14 DIAGNOSIS — J438 Other emphysema: Secondary | ICD-10-CM | POA: Diagnosis present

## 2014-08-14 DIAGNOSIS — IMO0001 Reserved for inherently not codable concepts without codable children: Principal | ICD-10-CM | POA: Diagnosis present

## 2014-08-14 DIAGNOSIS — IMO0002 Reserved for concepts with insufficient information to code with codable children: Secondary | ICD-10-CM

## 2014-08-14 DIAGNOSIS — Z87440 Personal history of urinary (tract) infections: Secondary | ICD-10-CM

## 2014-08-14 DIAGNOSIS — I739 Peripheral vascular disease, unspecified: Secondary | ICD-10-CM | POA: Diagnosis present

## 2014-08-14 DIAGNOSIS — R197 Diarrhea, unspecified: Secondary | ICD-10-CM | POA: Diagnosis present

## 2014-08-14 DIAGNOSIS — E785 Hyperlipidemia, unspecified: Secondary | ICD-10-CM | POA: Diagnosis present

## 2014-08-14 DIAGNOSIS — Z801 Family history of malignant neoplasm of trachea, bronchus and lung: Secondary | ICD-10-CM | POA: Diagnosis not present

## 2014-08-14 DIAGNOSIS — H5440 Blindness, one eye, unspecified eye: Secondary | ICD-10-CM | POA: Diagnosis present

## 2014-08-14 DIAGNOSIS — Z833 Family history of diabetes mellitus: Secondary | ICD-10-CM | POA: Diagnosis not present

## 2014-08-14 DIAGNOSIS — Z823 Family history of stroke: Secondary | ICD-10-CM

## 2014-08-14 DIAGNOSIS — M810 Age-related osteoporosis without current pathological fracture: Secondary | ICD-10-CM | POA: Diagnosis present

## 2014-08-14 DIAGNOSIS — Z7901 Long term (current) use of anticoagulants: Secondary | ICD-10-CM | POA: Diagnosis not present

## 2014-08-14 DIAGNOSIS — Z96619 Presence of unspecified artificial shoulder joint: Secondary | ICD-10-CM | POA: Diagnosis not present

## 2014-08-14 DIAGNOSIS — Z803 Family history of malignant neoplasm of breast: Secondary | ICD-10-CM

## 2014-08-14 DIAGNOSIS — I482 Chronic atrial fibrillation, unspecified: Secondary | ICD-10-CM

## 2014-08-14 DIAGNOSIS — Z800 Family history of malignant neoplasm of digestive organs: Secondary | ICD-10-CM

## 2014-08-14 DIAGNOSIS — Z860100 Personal history of colon polyps, unspecified: Secondary | ICD-10-CM

## 2014-08-14 DIAGNOSIS — Z79899 Other long term (current) drug therapy: Secondary | ICD-10-CM

## 2014-08-14 DIAGNOSIS — D179 Benign lipomatous neoplasm, unspecified: Secondary | ICD-10-CM | POA: Diagnosis present

## 2014-08-14 DIAGNOSIS — I350 Nonrheumatic aortic (valve) stenosis: Secondary | ICD-10-CM | POA: Diagnosis present

## 2014-08-14 DIAGNOSIS — K56609 Unspecified intestinal obstruction, unspecified as to partial versus complete obstruction: Secondary | ICD-10-CM | POA: Diagnosis present

## 2014-08-14 DIAGNOSIS — H409 Unspecified glaucoma: Secondary | ICD-10-CM | POA: Diagnosis present

## 2014-08-14 DIAGNOSIS — R109 Unspecified abdominal pain: Secondary | ICD-10-CM | POA: Diagnosis present

## 2014-08-14 DIAGNOSIS — R059 Cough, unspecified: Secondary | ICD-10-CM

## 2014-08-14 DIAGNOSIS — Z87891 Personal history of nicotine dependence: Secondary | ICD-10-CM | POA: Diagnosis not present

## 2014-08-14 DIAGNOSIS — K219 Gastro-esophageal reflux disease without esophagitis: Secondary | ICD-10-CM | POA: Diagnosis present

## 2014-08-14 DIAGNOSIS — J449 Chronic obstructive pulmonary disease, unspecified: Secondary | ICD-10-CM

## 2014-08-14 DIAGNOSIS — R3911 Hesitancy of micturition: Secondary | ICD-10-CM | POA: Diagnosis present

## 2014-08-14 DIAGNOSIS — M129 Arthropathy, unspecified: Secondary | ICD-10-CM

## 2014-08-14 DIAGNOSIS — R05 Cough: Secondary | ICD-10-CM

## 2014-08-14 DIAGNOSIS — Z9849 Cataract extraction status, unspecified eye: Secondary | ICD-10-CM

## 2014-08-14 DIAGNOSIS — Z806 Family history of leukemia: Secondary | ICD-10-CM

## 2014-08-14 DIAGNOSIS — K4131 Unilateral femoral hernia, with obstruction, without gangrene, recurrent: Secondary | ICD-10-CM | POA: Diagnosis present

## 2014-08-14 DIAGNOSIS — I4891 Unspecified atrial fibrillation: Secondary | ICD-10-CM | POA: Diagnosis present

## 2014-08-14 DIAGNOSIS — K4130 Unilateral femoral hernia, with obstruction, without gangrene, not specified as recurrent: Secondary | ICD-10-CM

## 2014-08-14 DIAGNOSIS — Z96649 Presence of unspecified artificial hip joint: Secondary | ICD-10-CM | POA: Diagnosis not present

## 2014-08-14 DIAGNOSIS — I080 Rheumatic disorders of both mitral and aortic valves: Secondary | ICD-10-CM | POA: Diagnosis present

## 2014-08-14 DIAGNOSIS — M1990 Unspecified osteoarthritis, unspecified site: Secondary | ICD-10-CM | POA: Diagnosis present

## 2014-08-14 DIAGNOSIS — Z8673 Personal history of transient ischemic attack (TIA), and cerebral infarction without residual deficits: Secondary | ICD-10-CM

## 2014-08-14 LAB — COMPREHENSIVE METABOLIC PANEL
ALBUMIN: 4 g/dL (ref 3.5–5.2)
ALK PHOS: 46 U/L (ref 39–117)
ALT: 23 U/L (ref 0–35)
AST: 34 U/L (ref 0–37)
Anion gap: 12 (ref 5–15)
BILIRUBIN TOTAL: 0.6 mg/dL (ref 0.3–1.2)
BUN: 13 mg/dL (ref 6–23)
CHLORIDE: 97 meq/L (ref 96–112)
CO2: 27 mEq/L (ref 19–32)
Calcium: 9.7 mg/dL (ref 8.4–10.5)
Creatinine, Ser: 0.86 mg/dL (ref 0.50–1.10)
GFR calc Af Amer: 69 mL/min — ABNORMAL LOW (ref >90)
GFR, EST NON AFRICAN AMERICAN: 60 mL/min — AB (ref >90)
Glucose, Bld: 98 mg/dL (ref 70–99)
POTASSIUM: 4.5 meq/L (ref 3.7–5.3)
Sodium: 136 mEq/L — ABNORMAL LOW (ref 137–147)
Total Protein: 6.9 g/dL (ref 6.0–8.3)

## 2014-08-14 LAB — LIPASE, BLOOD: LIPASE: 29 U/L (ref 11–59)

## 2014-08-14 LAB — URINALYSIS, ROUTINE W REFLEX MICROSCOPIC
BILIRUBIN URINE: NEGATIVE
GLUCOSE, UA: NEGATIVE mg/dL
HGB URINE DIPSTICK: NEGATIVE
KETONES UR: NEGATIVE mg/dL
Nitrite: NEGATIVE
PH: 7.5 (ref 5.0–8.0)
Protein, ur: NEGATIVE mg/dL
SPECIFIC GRAVITY, URINE: 1.01 (ref 1.005–1.030)
Urobilinogen, UA: 0.2 mg/dL (ref 0.0–1.0)

## 2014-08-14 LAB — DIGOXIN LEVEL: Digoxin Level: 0.7 ng/mL — ABNORMAL LOW (ref 0.8–2.0)

## 2014-08-14 LAB — URINE MICROSCOPIC-ADD ON

## 2014-08-14 LAB — CBC WITH DIFFERENTIAL/PLATELET
BASOS ABS: 0.1 10*3/uL (ref 0.0–0.1)
Basophils Relative: 1 % (ref 0–1)
EOS PCT: 1 % (ref 0–5)
Eosinophils Absolute: 0.1 10*3/uL (ref 0.0–0.7)
HCT: 46.4 % — ABNORMAL HIGH (ref 36.0–46.0)
Hemoglobin: 15.4 g/dL — ABNORMAL HIGH (ref 12.0–15.0)
LYMPHS ABS: 1.9 10*3/uL (ref 0.7–4.0)
Lymphocytes Relative: 29 % (ref 12–46)
MCH: 28.9 pg (ref 26.0–34.0)
MCHC: 33.2 g/dL (ref 30.0–36.0)
MCV: 87.1 fL (ref 78.0–100.0)
Monocytes Absolute: 0.7 10*3/uL (ref 0.1–1.0)
Monocytes Relative: 11 % (ref 3–12)
NEUTROS PCT: 58 % (ref 43–77)
Neutro Abs: 3.7 10*3/uL (ref 1.7–7.7)
PLATELETS: 185 10*3/uL (ref 150–400)
RBC: 5.33 MIL/uL — AB (ref 3.87–5.11)
RDW: 14.4 % (ref 11.5–15.5)
WBC: 6.5 10*3/uL (ref 4.0–10.5)

## 2014-08-14 MED ORDER — IOHEXOL 300 MG/ML  SOLN
50.0000 mL | Freq: Once | INTRAMUSCULAR | Status: AC | PRN
  Administered 2014-08-14: 50 mL via ORAL

## 2014-08-14 MED ORDER — IOHEXOL 300 MG/ML  SOLN
100.0000 mL | Freq: Once | INTRAMUSCULAR | Status: AC | PRN
  Administered 2014-08-14: 100 mL via INTRAVENOUS

## 2014-08-14 MED ORDER — SODIUM CHLORIDE 0.9 % IV BOLUS (SEPSIS)
1000.0000 mL | Freq: Once | INTRAVENOUS | Status: AC
  Administered 2014-08-14: 1000 mL via INTRAVENOUS

## 2014-08-14 MED ORDER — MORPHINE SULFATE 4 MG/ML IJ SOLN
4.0000 mg | Freq: Once | INTRAMUSCULAR | Status: AC
  Administered 2014-08-14: 4 mg via INTRAVENOUS
  Filled 2014-08-14: qty 1

## 2014-08-14 NOTE — ED Notes (Signed)
She c/o pan-abd. Pain since ~ 12noon today which persists.  She states she had a similar episode about a year ago, but cannot recall details about diagnostics or diagnosis, but she says she was not admitted to hospital.  She is in no distress. She denies vomiting or diarrhea.

## 2014-08-14 NOTE — ED Provider Notes (Signed)
CSN: [id number]     Arrival date & time 08/14/14  1836 History   First MD Initiated Contact with Patient 08/14/14 1847     Chief Complaint  Patient presents with  . Abdominal Pain     (Consider location/radiation/quality/duration/timing/severity/associated sxs/prior Treatment) The history is provided by the patient.  Kristine Simmons is a 80 y.o. female hx of HL, afib on eliquis, here with abdominal pain. She has onset of diffuse abdominal pain starting at noon. Pain is sharp, intermittent. No radiation. No vomiting or diarrhea. Had bowel movement yesterday. Was here in April and had CT that showed constipation and diverticulosis. Patient has been on miralax and states that she hasn't been constipated.    Past Medical History  Diagnosis Date  . Hx of colonic polyps     last colonoscopy 2005, repeat was due 2010 Dr. Sharlett Iles  . Hyperlipidemia   . Osteoporosis   . Atrial fibrillation     Dr Percival Spanish  . Cerebrovascular accident 01/09  . Fracture 1995, 2009    RUE; LUE Dr. Durward Fortes  . 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 Hyperlipidemia   . Hypothyroidism   . Cystitis   . Complication of anesthesia     difficulty with arousal post op   Past Surgical History  Procedure Laterality Date  . Tonsillectomy and adenoidectomy    . Vocal cord polyps    . Thyroid needle biopsy  2002  . Appendectomy    . Cataract extraction      Bilat  . Colonoscopy w/ polypectomy  2005    Diverticulosis;Dr. Sharlett Iles  . Total hip arthroplasty  2008    CHF & RAF post op   . Total shoulder replacement  2010    Dr Durward Fortes - left shoulder  . Mohs surgery  2013    nasal Basal Cell; Dr Sarajane Jews  . Excision metacarpal mass Right 08/17/2013    Procedure: RIGHT LONG EXCISION MASS AND DIP JOINT DEBRIDEMENT;  Surgeon: Tennis Must, MD;  Location: [facility];  Service: Orthopedics;  Laterality: Right;   Family History  Problem Relation Age of Onset  . Stroke Father      > 26  . Diabetes Father   . Leukemia Mother   . Cancer Mother     leukemia  . Colon cancer Brother     Valve replacement  . Heart attack Brother   . Breast cancer Sister   . Ovarian cancer Sister   . Lung cancer Sister     smoker  . Prostate cancer Brother   . Cancer Brother     bladder   History  Substance Use Topics  . Smoking status: Former Smoker    Quit date: 12/23/1993  . Smokeless tobacco: Not on file     Comment: Smoked 1950-1995, up to 1 ppd  . Alcohol Use: No   OB History   Grav Para Term Preterm Abortions TAB SAB Ect Mult Living                 Review of Systems  Gastrointestinal: Positive for abdominal pain.      Allergies  Desmopressin acetate; Dorzolamide hcl; Hydrocodone-acetaminophen; Levofloxacin; and Ciprofloxacin  Home Medications   Prior to Admission medications   Medication Sig Start Date End Date Taking? Authorizing Provider  amoxicillin (AMOXIL) 500 MG capsule 4 pills 30-60 minutes pre-dental procedure 08/10/12  Yes Hendricks Limes, MD  apixaban (ELIQUIS) 2.5 MG TABS tablet  Take 2.5 mg by mouth 2 (two) times daily.   Yes Historical Provider, MD  B Complex Vitamins (VITAMIN B COMPLEX) TABS Take 1 tablet by mouth daily.    Yes Historical Provider, MD  bimatoprost (LUMIGAN) 0.03 % ophthalmic solution Place 1 drop into both eyes at bedtime.    Yes Historical Provider, MD  Calcium Carbonate-Vitamin D (CALCIUM-VITAMIN D) 500-200 MG-UNIT per tablet Take 1 tablet by mouth daily.    Yes Historical Provider, MD  Cholecalciferol (VITAMIN D) 2000 UNITS tablet Take 2,000 Units by mouth daily.   Yes Historical Provider, MD  Cranberry (CRANBERRY CONCENTRATE) 500 MG CAPS Take 2 capsules by mouth daily.     Yes Historical Provider, MD  digoxin (LANOXIN) 0.125 MG tablet Take 0.125 mg by mouth at bedtime.    Yes Historical Provider, MD  diltiazem (CARDIZEM CD) 120 MG 24 hr capsule Take 120 mg by mouth daily.   Yes Historical Provider, MD   diphenhydramine-acetaminophen (TYLENOL PM) 25-500 MG TABS as needed. Infrequently   Yes Historical Provider, MD  ENSURE PLUS (ENSURE PLUS) LIQD Take 1 Can by mouth daily. 09/21/13  Yes Hendricks Limes, MD  estradiol (ESTRACE) 0.1 MG/GM vaginal cream Place 2 g vaginally. Twice weekly   Yes Historical Provider, MD  ezetimibe-simvastatin (VYTORIN) 10-40 MG per tablet Take 1 tablet by mouth at bedtime.    Yes Historical Provider, MD  levothyroxine (SYNTHROID, LEVOTHROID) 50 MCG tablet Take 50 mcg by mouth daily before breakfast.   Yes Historical Provider, MD  LORazepam (ATIVAN) 1 MG tablet Take 0.5 mg by mouth every 8 (eight) hours as needed for anxiety or sleep.   Yes Historical Provider, MD  Magnesium Oxide 500 MG TABS Take 1 tablet by mouth at bedtime.    Yes Historical Provider, MD  Multiple Vitamin (MULTIVITAMIN) tablet Take 1 tablet by mouth daily. Super Multivit   Yes Historical Provider, MD  Probiotic Product (MISC INTESTINAL FLORA REGULAT) CAPS Take 1 capsule by mouth daily.     Yes Historical Provider, MD  timolol (TIMOPTIC) 0.5 % ophthalmic solution Place 1 drop into both eyes 2 (two) times daily.    Yes Historical Provider, MD   BP 152/83  Pulse 61  Temp(Src) 97.8 F (36.6 C) (Oral)  Resp 18  SpO2 95% Physical Exam  Nursing note and vitals reviewed. Constitutional: She is oriented to person, place, and time.  Slightly uncomfortable   HENT:  Head: Normocephalic.  Mouth/Throat: Oropharynx is clear and moist.  Eyes: Conjunctivae are normal. Pupils are equal, round, and reactive to light.  Neck: Normal range of motion. Neck supple.  Cardiovascular: Normal rate, regular rhythm and normal heart sounds.   Pulmonary/Chest: Effort normal and breath sounds normal. No respiratory distress. She has no wheezes. She has no rales.  Abdominal: Soft.  Distended. + RLQ tenderness. R femoral hernia, easily reducible.   Musculoskeletal: Normal range of motion. She exhibits no edema.   Neurological: She is alert and oriented to person, place, and time. No cranial nerve deficit. Coordination normal.  Skin: Skin is warm and dry.  Psychiatric: She has a normal mood and affect. Her behavior is normal. Judgment and thought content normal.    ED Course  Procedures (including critical care time) Labs Review Labs Reviewed  CBC WITH DIFFERENTIAL - Abnormal; Notable for the following:    RBC 5.33 (*)    Hemoglobin 15.4 (*)    HCT 46.4 (*)    All other components within normal limits  COMPREHENSIVE METABOLIC PANEL -  Abnormal; Notable for the following:    Sodium 136 (*)    GFR calc non Af Amer 60 (*)    GFR calc Af Amer 69 (*)    All other components within normal limits  URINALYSIS, ROUTINE W REFLEX MICROSCOPIC - Abnormal; Notable for the following:    APPearance CLOUDY (*)    Leukocytes, UA TRACE (*)    All other components within normal limits  DIGOXIN LEVEL - Abnormal; Notable for the following:    Digoxin Level 0.7 (*)    All other components within normal limits  LIPASE, BLOOD  URINE MICROSCOPIC-ADD ON    Imaging Review Ct Abdomen Pelvis W Contrast  08/14/2014   CLINICAL DATA:  Abdominal pain today.  Previous appendectomy.  EXAM: CT ABDOMEN AND PELVIS WITH CONTRAST  TECHNIQUE: Multidetector CT imaging of the abdomen and pelvis was performed using the standard protocol following bolus administration of intravenous contrast.  CONTRAST:  182mL OMNIPAQUE IOHEXOL 300 MG/ML  SOLN  COMPARISON:  04/08/2014.  FINDINGS: Multiple mildly dilated, fluid-filled small bowel loops with normal caliber distal loops. There is a 1.3 cm oval calcific density within 1 of the dilated small bowel loops, suggesting an ingested tablet. The dilated, fluid-filled small bowel extend into a proximal right inguinal hernia with a suggestion of normal caliber of the exiting loop. This is difficult to assess due to streak artifacts associated with a right hip prosthesis. No bowel wall thickening or  pneumatosis seen.  Multiple calcified splenic granulomata. Smaller number of calcified liver granulomata. Minimal free peritoneal fluid. Somewhat small pancreas. Unremarkable gallbladder, adrenal glands and visualized portion of the urinary bladder. Small bilateral renal cysts. Cortical scar in the lower pole of the right kidney. Lumbar and lower thoracic spine degenerative changes. No enlarged lymph nodes. No evidence of appendicitis (surgically absent by history). Atheromatous arterial calcifications. Interstitial fibrosis with some honeycombing at both lung bases posteriorly. Mild bullous changes as well.  IMPRESSION: 1. Small bowel obstruction, most likely due to entrapment within a right inguinal hernia without evidence of ischemia. 2. Minimal free peritoneal fluid. 3. COPD. These results were called by telephone at the time of interpretation on 08/14/2014 at 10:10 pm to Dr. Shirlyn Goltz , who verbally acknowledged these results.   Electronically Signed   By: Enrique Sack M.D.   On: 08/14/2014 22:11     EKG Interpretation None      MDM   Final diagnoses:  None    Kristine Simmons is a 80 y.o. female here with ab pain. Consider SBO. Will get CT ab/pel to assess.   10:17 PM CT showed SBO with R inguinal hernia. I was able to reduce the hernia easily. I don't think she has incarcerated hernia. I called Dr. Harlow Asa to consult. Will admit to medicine.      Wandra Arthurs, MD 08/14/14 [phone]

## 2014-08-14 NOTE — H&P (Addendum)
[facility]                                                                                    Patient Demographics  Kristine Simmons, is a 80 y.o. female  CSN: [id number]  MRN: [id number]  DOB - 03/10/1927  Admit Date - 08/14/2014  Outpatient Primary MD for the patient is Unice Cobble, MD   With History of -  Past Medical History  Diagnosis Date  . Hx of colonic polyps     last colonoscopy 2005, repeat was due 2010 Dr. Sharlett Iles  . Hyperlipidemia   . Osteoporosis   . Atrial fibrillation     Dr Percival Spanish  . Cerebrovascular accident 01/09  . Fracture 1995, 2009    RUE; LUE Dr. Durward Fortes  . 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 Hyperlipidemia   . Hypothyroidism   . Cystitis   . Complication of anesthesia     difficulty with arousal post op      Past Surgical History  Procedure Laterality Date  . Tonsillectomy and adenoidectomy    . Vocal cord polyps    . Thyroid needle biopsy  2002  . Appendectomy    . Cataract extraction      Bilat  . Colonoscopy w/ polypectomy  2005    Diverticulosis;Dr. Sharlett Iles  . Total hip arthroplasty  2008    CHF & RAF post op   . Total shoulder replacement  2010    Dr Durward Fortes - left shoulder  . Mohs surgery  2013    nasal Basal Cell; Dr Sarajane Jews  . Excision metacarpal mass Right 08/17/2013    Procedure: RIGHT LONG EXCISION MASS AND DIP JOINT DEBRIDEMENT;  Surgeon: Tennis Must, MD;  Location: [place];  Service: Orthopedics;  Laterality: Right;    in for   Chief Complaint  Patient presents with  . Abdominal Pain     HPI  Kristine Simmons  is a 80 y.o. female, with past medical history significant for atrial fibrillation, osteoarthritis and hypothyroidism presenting today with a new onset abdominal pain of one-day duration he nausea vomiting or diarrhea. No fever or chills . CT of the abdomen showed small bowel obstruction within a right inguinal hernia without evidence of  ischemia. The hernia was easily reduced by ER physician. Patient is feeling better at this time. I was called to admit    Review of Systems    In addition to the HPI above,  No Fever-chills, No Headache, No changes with Vision or hearing, No problems swallowing food or Liquids, No Chest pain, Cough or Shortness of Breath, No Blood in stool or Urine, No dysuria, No new skin rashes or bruises, No new joints pains-aches,  No new weakness, tingling, numbness in any extremity, No recent weight gain or loss, No polyuria, polydypsia or polyphagia, No significant Mental Stressors.  A full 10 point Review of Systems was done, except as stated above, all other Review of Systems were negative.   Social History History  Substance Use Topics  . Smoking status: Former Smoker    Quit date: 12/23/1993  . Smokeless tobacco:  Not on file     Comment: Smoked 1950-1995, up to 1 ppd  . Alcohol Use: No     Family History Family History  Problem Relation Age of Onset  . Stroke Father     > 16  . Diabetes Father   . Leukemia Mother   . Cancer Mother     leukemia  . Colon cancer Brother     Valve replacement  . Heart attack Brother   . Breast cancer Sister   . Ovarian cancer Sister   . Lung cancer Sister     smoker  . Prostate cancer Brother   . Cancer Brother     bladder     Prior to Admission medications   Medication Sig Start Date End Date Taking? Authorizing Provider  amoxicillin (AMOXIL) 500 MG capsule 4 pills 30-60 minutes pre-dental procedure 08/10/12  Yes Hendricks Limes, MD  apixaban (ELIQUIS) 2.5 MG TABS tablet Take 2.5 mg by mouth 2 (two) times daily.   Yes Historical Provider, MD  B Complex Vitamins (VITAMIN B COMPLEX) TABS Take 1 tablet by mouth daily.    Yes Historical Provider, MD  bimatoprost (LUMIGAN) 0.03 % ophthalmic solution Place 1 drop into both eyes at bedtime.    Yes Historical Provider, MD  Calcium Carbonate-Vitamin D (CALCIUM-VITAMIN D) 500-200 MG-UNIT  per tablet Take 1 tablet by mouth daily.    Yes Historical Provider, MD  Cholecalciferol (VITAMIN D) 2000 UNITS tablet Take 2,000 Units by mouth daily.   Yes Historical Provider, MD  Cranberry (CRANBERRY CONCENTRATE) 500 MG CAPS Take 2 capsules by mouth daily.     Yes Historical Provider, MD  digoxin (LANOXIN) 0.125 MG tablet Take 0.125 mg by mouth at bedtime.    Yes Historical Provider, MD  diltiazem (CARDIZEM CD) 120 MG 24 hr capsule Take 120 mg by mouth daily.   Yes Historical Provider, MD  diphenhydramine-acetaminophen (TYLENOL PM) 25-500 MG TABS as needed. Infrequently   Yes Historical Provider, MD  ENSURE PLUS (ENSURE PLUS) LIQD Take 1 Can by mouth daily. 09/21/13  Yes Hendricks Limes, MD  estradiol (ESTRACE) 0.1 MG/GM vaginal cream Place 2 g vaginally. Twice weekly   Yes Historical Provider, MD  ezetimibe-simvastatin (VYTORIN) 10-40 MG per tablet Take 1 tablet by mouth at bedtime.    Yes Historical Provider, MD  levothyroxine (SYNTHROID, LEVOTHROID) 50 MCG tablet Take 50 mcg by mouth daily before breakfast.   Yes Historical Provider, MD  LORazepam (ATIVAN) 1 MG tablet Take 0.5 mg by mouth every 8 (eight) hours as needed for anxiety or sleep.   Yes Historical Provider, MD  Magnesium Oxide 500 MG TABS Take 1 tablet by mouth at bedtime.    Yes Historical Provider, MD  Multiple Vitamin (MULTIVITAMIN) tablet Take 1 tablet by mouth daily. Super Multivit   Yes Historical Provider, MD  Probiotic Product (MISC INTESTINAL FLORA REGULAT) CAPS Take 1 capsule by mouth daily.     Yes Historical Provider, MD  timolol (TIMOPTIC) 0.5 % ophthalmic solution Place 1 drop into both eyes 2 (two) times daily.    Yes Historical Provider, MD    Allergies  Allergen Reactions  . Desmopressin Acetate     REACTION: swelling feet  . Dorzolamide Hcl Other (See Comments)    Burning in eyes and redness  . Hydrocodone-Acetaminophen Nausea And Vomiting    dizzy  . Levofloxacin     REACTION: INTERFERS WITH COUMADIN  .  Ciprofloxacin     REACTION: vaginal infection  Physical Exam  Vitals  Blood pressure 152/83, pulse 61, temperature 97.8 F (36.6 C), temperature source Oral, resp. rate 18, SpO2 95.00%.   1. General elderly white female very pleasant, in no acute distress at this time  2. Not Suicidal or Homicidal, Awake Alert, Oriented X 3.  3. No F.N deficits, ALL C.Nerves Intact, Strength 5/5 all 4 extremities,   4. Ears and Eyes appear Normal, Conjunctivae clear, PERRLA. Moist Oral Mucosa.  5. Supple Neck, No JVD, No cervical lymphadenopathy appriciated, No Carotid Bruits.  6. Symmetrical Chest wall movement, Good air movement bilaterally,   7. irregularly irregular, No Gallops, Rubs or Murmurs, No Parasternal Heave.  8. Positive Bowel Sounds, Abdomen Soft, mild right lower quadrant tenderness, No organomegaly appriciated,No rebound -guarding or rigidity, right inguinal hernia easily reducible  9.  No Cyanosis, Normal Skin Turgor, No Skin Rash or Bruise.  10. Good muscle tone,  joints appear normal , no effusions, Normal ROM.  11. No Palpable Lymph Nodes in Neck or Axillae    Data Review  CBC  Recent Labs Lab 08/14/14 1912  WBC 6.5  HGB 15.4*  HCT 46.4*  PLT 185  MCV 87.1  MCH 28.9  MCHC 33.2  RDW 14.4  LYMPHSABS 1.9  MONOABS 0.7  EOSABS 0.1  BASOSABS 0.1   ------------------------------------------------------------------------------------------------------------------  Chemistries   Recent Labs Lab 08/14/14 1912  NA 136*  K 4.5  CL 97  CO2 27  GLUCOSE 98  BUN 13  CREATININE 0.86  CALCIUM 9.7  AST 34  ALT 23  ALKPHOS 69  BILITOT 0.6   ------------------------------------------------------------------------------------------------------------------ CrCl is unknown because both a height and weight (above a minimum accepted value) are required for this  calculation. ------------------------------------------------------------------------------------------------------------------ No results found for this basename: TSH, T4TOTAL, FREET3, T3FREE, THYROIDAB,  in the last 72 hours   Coagulation profile No results found for this basename: INR, PROTIME,  in the last 168 hours ------------------------------------------------------------------------------------------------------------------- No results found for this basename: DDIMER,  in the last 72 hours -------------------------------------------------------------------------------------------------------------------  Cardiac Enzymes No results found for this basename: CK, CKMB, TROPONINI, MYOGLOBIN,  in the last 168 hours ------------------------------------------------------------------------------------------------------------------ No components found with this basename: POCBNP,    ---------------------------------------------------------------------------------------------------------------  Urinalysis    Component Value Date/Time   COLORURINE YELLOW 08/14/2014 2043   APPEARANCEUR CLOUDY* 08/14/2014 2043   LABSPEC 1.010 08/14/2014 2043   PHURINE 7.5 08/14/2014 2043   [place] 08/14/2014 2043   [place] 03/07/2010 0807   HGBUR NEGATIVE 08/14/2014 2043   HGBUR moderate 10/05/2010 0825   BILIRUBINUR NEGATIVE 08/14/2014 2043   [place] 08/14/2014 2043   PROTEINUR NEGATIVE 08/14/2014 2043   UROBILINOGEN 0.2 08/14/2014 2043   NITRITE NEGATIVE 08/14/2014 2043   LEUKOCYTESUR TRACE* 08/14/2014 2043    ----------------------------------------------------------------------------------------------------------------   Imaging results:   Ct Abdomen Pelvis W Contrast  08/14/2014   CLINICAL DATA:  Abdominal pain today.  Previous appendectomy.  EXAM: CT ABDOMEN AND PELVIS WITH CONTRAST  TECHNIQUE: Multidetector CT imaging of the abdomen and pelvis was performed using the  standard protocol following bolus administration of intravenous contrast.  CONTRAST:  149mL OMNIPAQUE IOHEXOL 300 MG/ML  SOLN  COMPARISON:  04/08/2014.  FINDINGS: Multiple mildly dilated, fluid-filled small bowel loops with normal caliber distal loops. There is a 1.3 cm oval calcific density within 1 of the dilated small bowel loops, suggesting an ingested tablet. The dilated, fluid-filled small bowel extend into a proximal right inguinal hernia with a suggestion of normal caliber of the exiting loop. This is difficult  to assess due to streak artifacts associated with a right hip prosthesis. No bowel wall thickening or pneumatosis seen.  Multiple calcified splenic granulomata. Smaller number of calcified liver granulomata. Minimal free peritoneal fluid. Somewhat small pancreas. Unremarkable gallbladder, adrenal glands and visualized portion of the urinary bladder. Small bilateral renal cysts. Cortical scar in the lower pole of the right kidney. Lumbar and lower thoracic spine degenerative changes. No enlarged lymph nodes. No evidence of appendicitis (surgically absent by history). Atheromatous arterial calcifications. Interstitial fibrosis with some honeycombing at both lung bases posteriorly. Mild bullous changes as well.  IMPRESSION: 1. Small bowel obstruction, most likely due to entrapment within a right inguinal hernia without evidence of ischemia. 2. Minimal free peritoneal fluid. 3. COPD. These results were called by telephone at the time of interpretation on 08/14/2014 at 10:10 pm to Dr. Shirlyn Goltz , who verbally acknowledged these results.   Electronically Signed   By: Enrique Sack M.D.   On: 08/14/2014 22:11      Assessment & Plan   1. Reducible right inguinal hernia with small bowel obstruction     Consults surgery in a.m.     Followup KUB in a.m.     Hold Eliquis today     N.p.o. except for sips with medications until evaluated by surgery     IV fluids     Pain control 2. Chronic atrial  fibrillation     Eliquis on hold     Rate controlled 3. History of CVA/osteoporosis/hyperlipidemia 4. hypothyroidism      check TSH     DVT Prophylaxis SCDs  AM Labs Ordered, also please review Full Orders  Family Communication: Admission, patients condition and plan of care including tests being ordered have been discussed with the patient and husband who indicate understanding and agree with the plan and Code Status.  Code Status full  Disposition Plan: Home  Time spent in minutes : 34 minutes  Condition GUARDED  @SIGNATURE @

## 2014-08-15 ENCOUNTER — Encounter (HOSPITAL_COMMUNITY): Payer: Self-pay

## 2014-08-15 ENCOUNTER — Inpatient Hospital Stay (HOSPITAL_COMMUNITY): Payer: Medicare Other

## 2014-08-15 DIAGNOSIS — IMO0001 Reserved for inherently not codable concepts without codable children: Principal | ICD-10-CM

## 2014-08-15 DIAGNOSIS — I359 Nonrheumatic aortic valve disorder, unspecified: Secondary | ICD-10-CM

## 2014-08-15 DIAGNOSIS — I350 Nonrheumatic aortic (valve) stenosis: Secondary | ICD-10-CM

## 2014-08-15 DIAGNOSIS — K56609 Unspecified intestinal obstruction, unspecified as to partial versus complete obstruction: Secondary | ICD-10-CM

## 2014-08-15 DIAGNOSIS — I4891 Unspecified atrial fibrillation: Secondary | ICD-10-CM

## 2014-08-15 DIAGNOSIS — Z7901 Long term (current) use of anticoagulants: Secondary | ICD-10-CM

## 2014-08-15 DIAGNOSIS — Z8673 Personal history of transient ischemic attack (TIA), and cerebral infarction without residual deficits: Secondary | ICD-10-CM

## 2014-08-15 DIAGNOSIS — K4131 Unilateral femoral hernia, with obstruction, without gangrene, recurrent: Secondary | ICD-10-CM | POA: Diagnosis present

## 2014-08-15 HISTORY — DX: Nonrheumatic aortic (valve) stenosis: I35.0

## 2014-08-15 HISTORY — DX: Long term (current) use of anticoagulants: Z79.01

## 2014-08-15 LAB — COMPREHENSIVE METABOLIC PANEL
ALBUMIN: 3.1 g/dL — AB (ref 3.5–5.2)
ALT: 17 U/L (ref 0–35)
ANION GAP: 10 (ref 5–15)
AST: 25 U/L (ref 0–37)
Alkaline Phosphatase: 38 U/L — ABNORMAL LOW (ref 39–117)
BILIRUBIN TOTAL: 0.8 mg/dL (ref 0.3–1.2)
BUN: 10 mg/dL (ref 6–23)
CALCIUM: 8.4 mg/dL (ref 8.4–10.5)
CHLORIDE: 102 meq/L (ref 96–112)
CO2: 25 mEq/L (ref 19–32)
CREATININE: 0.83 mg/dL (ref 0.50–1.10)
GFR calc Af Amer: 72 mL/min — ABNORMAL LOW (ref >90)
GFR calc non Af Amer: 63 mL/min — ABNORMAL LOW (ref >90)
Glucose, Bld: 83 mg/dL (ref 70–99)
Potassium: 4.3 mEq/L (ref 3.7–5.3)
Sodium: 137 mEq/L (ref 137–147)
Total Protein: 5.4 g/dL — ABNORMAL LOW (ref 6.0–8.3)

## 2014-08-15 LAB — TSH: TSH: 5.36 u[IU]/mL — AB (ref 0.350–4.500)

## 2014-08-15 MED ORDER — SODIUM CHLORIDE 0.45 % IV SOLN
INTRAVENOUS | Status: DC
  Administered 2014-08-15: via INTRAVENOUS
  Administered 2014-08-15: 75 mL/h via INTRAVENOUS
  Administered 2014-08-16 – 2014-08-17 (×3): via INTRAVENOUS

## 2014-08-15 MED ORDER — LEVOTHYROXINE SODIUM 50 MCG PO TABS
50.0000 ug | ORAL_TABLET | Freq: Every day | ORAL | Status: DC
  Administered 2014-08-15 – 2014-08-21 (×7): 50 ug via ORAL
  Filled 2014-08-15 (×8): qty 1

## 2014-08-15 MED ORDER — LORAZEPAM 0.5 MG PO TABS: 0.5000 mg | ORAL_TABLET | Freq: Three times a day (TID) | ORAL | Status: DC | PRN

## 2014-08-15 MED ORDER — ONDANSETRON HCL 4 MG/2ML IJ SOLN: 4.0000 mg | Freq: Four times a day (QID) | INTRAMUSCULAR | Status: DC | PRN

## 2014-08-15 MED ORDER — ONDANSETRON HCL 4 MG PO TABS: 4.0000 mg | ORAL_TABLET | Freq: Four times a day (QID) | ORAL | Status: DC | PRN

## 2014-08-15 MED ORDER — DILTIAZEM HCL ER COATED BEADS 120 MG PO CP24
120.0000 mg | ORAL_CAPSULE | Freq: Every day | ORAL | Status: DC
  Administered 2014-08-15 – 2014-08-20 (×6): 120 mg via ORAL
  Filled 2014-08-15 (×8): qty 1

## 2014-08-15 MED ORDER — DIGOXIN 125 MCG PO TABS
0.1250 mg | ORAL_TABLET | Freq: Every day | ORAL | Status: DC
  Administered 2014-08-15 – 2014-08-20 (×7): 0.125 mg via ORAL
  Filled 2014-08-15 (×8): qty 1

## 2014-08-15 MED ORDER — SODIUM CHLORIDE 0.9 % IV SOLN: INTRAVENOUS | Status: AC

## 2014-08-15 MED ORDER — TIMOLOL MALEATE 0.5 % OP SOLN
1.0000 [drp] | Freq: Two times a day (BID) | OPHTHALMIC | Status: DC
  Administered 2014-08-15 – 2014-08-21 (×13): 1 [drp] via OPHTHALMIC
  Filled 2014-08-15: qty 5

## 2014-08-15 MED ORDER — ONDANSETRON HCL 4 MG/2ML IJ SOLN: 4.0000 mg | Freq: Three times a day (TID) | INTRAMUSCULAR | Status: DC | PRN

## 2014-08-15 MED ORDER — MORPHINE SULFATE 2 MG/ML IJ SOLN
1.0000 mg | INTRAMUSCULAR | Status: DC | PRN
Start: 2014-08-15 — End: 2014-08-18
  Administered 2014-08-18: 1 mg via INTRAVENOUS
  Filled 2014-08-15: qty 1

## 2014-08-15 MED ORDER — OXYCODONE HCL 5 MG PO TABS: 5.0000 mg | ORAL_TABLET | ORAL | Status: DC | PRN

## 2014-08-15 MED ORDER — ACETAMINOPHEN 650 MG RE SUPP: 650.0000 mg | Freq: Four times a day (QID) | RECTAL | Status: DC | PRN

## 2014-08-15 MED ORDER — PANTOPRAZOLE SODIUM 40 MG IV SOLR
40.0000 mg | INTRAVENOUS | Status: DC
  Administered 2014-08-15 – 2014-08-21 (×7): 40 mg via INTRAVENOUS
  Filled 2014-08-15 (×7): qty 40

## 2014-08-15 MED ORDER — ACETAMINOPHEN 325 MG PO TABS: 650.0000 mg | ORAL_TABLET | Freq: Four times a day (QID) | ORAL | Status: DC | PRN

## 2014-08-15 MED ORDER — LATANOPROST 0.005 % OP SOLN
1.0000 [drp] | Freq: Every day | OPHTHALMIC | Status: DC
  Administered 2014-08-15 – 2014-08-20 (×6): 1 [drp] via OPHTHALMIC
  Filled 2014-08-15: qty 2.5

## 2014-08-15 MED ORDER — ZOLPIDEM TARTRATE 5 MG PO TABS
5.0000 mg | ORAL_TABLET | Freq: Every evening | ORAL | Status: DC | PRN
  Administered 2014-08-15 – 2014-08-16 (×3): 5 mg via ORAL
  Filled 2014-08-15 (×3): qty 1

## 2014-08-15 NOTE — Consult Note (Signed)
General surgery attending:  I have interviewed and examined this patient this morning. I agree with the assessment and treatment plan outlined by Mr. Creig Hines, [place].  This morning the patient is asymptomatic. Passing flatus. Denies nausea or pain.  Prior similar episode in April.Her abdominal pain has completely resolved. Her abdomen is soft, nontender, and nondistended. Bowel sounds are present. There is a right groin hernia that is easily reducible, nontender,  more likely femoral than inguinal.  CT scan reviewed. The hernia looked like a femoral hernia. Abdominal x-rays today showed no evidence of bowel obstruction.  Assessment/plan: 1. SBO with right femoral hernia. Hernia reduced. Clinically, SBO has resolved.      Start liquid diet and reassess tomorrow      Holding  Eliquis.        She is at significant risk for recurrent incarceration, obstruction, and possible coagulation from her right groin                   hernia. She is advised to have this repaired. Possibly this admission. Certainly, it would be best that her                   bowel obstruction be resolved prior to the surgery. Would consider semi-elective surgery no sooner than           Wednesday due    to Eliquis.Marland Kitchen        She will need to have a cardiac risk assessment prior to any surgery. Her cardiologist is Dr. Percival Spanish. We will contact cardiology.  2. Atrial fibrillation on Eliquis (last dose 6 PM 08/15/14). 3. Hx of CVA, with blindness left eye  4. Hx of tobacco use/COPD  5. Mild Aortic and mitral stenosis with normal EF on Echo 06/06/14  6. Hyperlipidemia  7. Hypothyroid  8. Limited mobility/balance and vision issues  9. S/p Right hip, left shoulder surgeries, with right knee limitations.  10. Chronic constipation issues  11. Recurrent UTI's she is on antibiotics QOD for this, it sounds like Septra, but she isn't sure at this point.   Edsel Petrin. Dalbert Batman, M.D., [facility] General and Minimally  invasive Surgery Breast and Colorectal Surgery Office:   [phone] Pager:   [phone]

## 2014-08-15 NOTE — Progress Notes (Signed)
INITIAL NUTRITION ASSESSMENT  DOCUMENTATION CODES Per approved criteria  -Not Applicable   INTERVENTION: - Diet advancement per MD - RD to continue to monitor   NUTRITION DIAGNOSIS: Inadequate oral intake related to inability to eat as evidenced by NPO.   Goal: Advance diet as tolerated to regular diet   Monitor:  Weights, labs, diet advancement  Reason for Assessment: Malnutrition screening tool   80 y.o. female  Admitting Dx: Small bowel obstruction  ASSESSMENT: Pt with past medical history significant for atrial fibrillation, osteoarthritis and hypothyroidism presenting today with a new onset abdominal pain of one-day duration he nausea vomiting or diarrhea. No fever or chills . CT of the abdomen showed small bowel obstruction within a right inguinal hernia without evidence of ischemia.  - Met with pt and husband - Pt reports eating 3 meals/day at home of healthy diet - Has honey nut cheerios with fruit for breakfast, a sandwich with fruit for lunch, and a meat/vegetables/starch for dinner - Used to drink 1 Ensure/day but hasn't done that recently - Said she has usual body weight of 121 pounds, currently 130 pounds - During nutrition focused physical exam, pt said that she has no sensation in her feet and said this has been going on for a long time - Pt denies any abdominal pain today  Nutrition Focused Physical Exam:  Subcutaneous Fat:  Orbital Region: wnl Upper Arm Region: wnl Thoracic and Lumbar Region: NA  Muscle:  Temple Region: mild wasting Clavicle Bone Region: wnl Clavicle and Acromion Bone Region: mild wasting Scapular Bone Region: wnl Dorsal Hand: wnl Patellar Region: wnl Anterior Thigh Region: wnl Posterior Calf Region: wnl  Edema: In ankles   Height: Ht Readings from Last 1 Encounters:  08/15/14 _0  (1.702 m)    Weight: Wt Readings from Last 1 Encounters:  08/15/14 130 lb 8.2 oz (59.2 kg)    Ideal Body Weight: 135 lbs   % Ideal Body  Weight: 96%  Wt Readings from Last 10 Encounters:  08/15/14 130 lb 8.2 oz (59.2 kg)  05/17/14 126 lb (57.153 kg)  09/21/13 122 lb 6.4 oz (55.52 kg)  08/17/13 121 lb 2 oz (54.942 kg)  08/17/13 121 lb 2 oz (54.942 kg)  05/10/13 124 lb 3.2 oz (56.337 kg)  01/11/13 126 lb (57.153 kg)  02/02/13 126 lb (57.153 kg)  09/16/12 128 lb (58.06 kg)  08/04/12 128 lb 9.6 oz (58.333 kg)    Usual Body Weight: 121 lbs   % Usual Body Weight: 107%  BMI:  Body mass index is 20.44 kg/(m^2).  Estimated Nutritional Needs: Kcal: 1500-1700 Protein: 60-70g Fluid: 1.5-1.7L/day   Skin: intact   Diet Order: NPO  EDUCATION NEEDS: -No education needs identified at this time   Intake/Output Summary (Last 24 hours) at 08/15/14 1116 Last data filed at 08/15/14 1000  Gross per 24 hour  Intake      0 ml  Output    500 ml  Net   -500 ml    Last BM: 8/24  Labs:   Recent Labs Lab 08/14/14 1912 08/15/14 0455  NA 136* 137  K 4.5 4.3  CL 97 102  CO2 27 25  BUN 13 10  CREATININE 0.86 0.83  CALCIUM 9.7 8.4  GLUCOSE 98 83    CBG (last 3)  No results found for this basename: GLUCAP,  in the last 72 hours  Scheduled Meds: . sodium chloride   Intravenous STAT  . digoxin  0.125 mg Oral QHS  .  diltiazem  120 mg Oral Daily  . latanoprost  1 drop Both Eyes QHS  . levothyroxine  50 mcg Oral QAC breakfast  . pantoprazole (PROTONIX) IV  40 mg Intravenous Q24H  . timolol  1 drop Both Eyes BID    Continuous Infusions: . sodium chloride 75 mL/hr at 08/15/14 2774    Past Medical History  Diagnosis Date  . Hx of colonic polyps     last colonoscopy 2005, repeat was due 2010 Dr. Sharlett Iles  . Hyperlipidemia   . Osteoporosis   . Atrial fibrillation     Dr Percival Spanish  . Cerebrovascular accident 01/09  . Fracture 1995, 2009    RUE; LUE Dr. Durward Fortes  . 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 Hyperlipidemia   . Hypothyroidism   . Cystitis   . Complication of anesthesia      difficulty with arousal post op    Past Surgical History  Procedure Laterality Date  . Tonsillectomy and adenoidectomy    . Vocal cord polyps    . Thyroid needle biopsy  2002  . Appendectomy    . Cataract extraction      Bilat  . Colonoscopy w/ polypectomy  2005    Diverticulosis;Dr. Sharlett Iles  . Total hip arthroplasty  2008    CHF & RAF post op   . Total shoulder replacement  2010    Dr Durward Fortes - left shoulder  . Mohs surgery  2013    nasal Basal Cell; Dr Sarajane Jews  . Excision metacarpal mass Right 08/17/2013    Procedure: RIGHT LONG EXCISION MASS AND DIP JOINT DEBRIDEMENT;  Surgeon: Tennis Must, MD;  Location: [place];  Service: Orthopedics;  Laterality: Right;    Carlis Stable MS, [place], [place] Pager [phone] Weekend/After Hours Pager

## 2014-08-15 NOTE — Consult Note (Signed)
CARDIOLOGY CONSULT NOTE   Patient ID: Kristine Simmons MRN: [id number] DOB/AGE: Dec 23, 1927 80 y.o.  Admit Date: 08/14/2014 Primary Physician: Unice Cobble, MD Primary Cardiologist    [place]  Clinical Summary Kristine Simmons is a 80 y.o.female. She has small bowel obstruction related to a femoral hernia and she will require surgery. Cardiology is consulted to help with her preop evaluation.  She is quite stable from the cardiac viewpoint. She is followed by Dr. Percival Spanish. He saw her last in the office in May, 2015. After that time she had a 2-D echo in June, 2015. Left ventricular function remains normal with an ejection fraction of 60%. She has mild aortic stenosis. There is mild mitral regurgitation. There is a long-standing history of atrial fibrillation. Her rate is controlled. She is on Eliquis for this. It is currently being held by the surgical team. She is fully active. She's not having any chest pain or shortness of breath. She has a good activity level.   Allergies  Allergen Reactions  . Desmopressin Acetate     REACTION: swelling feet  . Dorzolamide Hcl Other (See Comments)    Burning in eyes and redness  . Hydrocodone-Acetaminophen Nausea And Vomiting    dizzy  . Levofloxacin     REACTION: INTERFERS WITH COUMADIN  . Ciprofloxacin     REACTION: vaginal infection    Medications Scheduled Medications: . sodium chloride   Intravenous STAT  . digoxin  0.125 mg Oral QHS  . diltiazem  120 mg Oral Daily  . latanoprost  1 drop Both Eyes QHS  . levothyroxine  50 mcg Oral QAC breakfast  . pantoprazole (PROTONIX) IV  40 mg Intravenous Q24H  . timolol  1 drop Both Eyes BID     Infusions: . sodium chloride 75 mL/hr at 08/15/14 0026     PRN Medications:  acetaminophen, acetaminophen, LORazepam, morphine injection, ondansetron (ZOFRAN) IV, ondansetron, oxyCODONE, zolpidem   Past Medical History  Diagnosis Date  . Hx of colonic polyps     last colonoscopy 2005,  repeat was due 2010 Dr. Sharlett Iles  . Hyperlipidemia   . Osteoporosis   . Atrial fibrillation     Dr Percival Spanish  . Cerebrovascular accident 01/09  . Fracture 1995, 2009    RUE; LUE Dr. Durward Fortes  . 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 Hyperlipidemia   . Hypothyroidism   . Cystitis   . Complication of anesthesia     difficulty with arousal post op    Past Surgical History  Procedure Laterality Date  . Tonsillectomy and adenoidectomy    . Vocal cord polyps    . Thyroid needle biopsy  2002  . Appendectomy    . Cataract extraction      Bilat  . Colonoscopy w/ polypectomy  2005    Diverticulosis;Dr. Sharlett Iles  . Total hip arthroplasty  2008    CHF & RAF post op   . Total shoulder replacement  2010    Dr Durward Fortes - left shoulder  . Mohs surgery  2013    nasal Basal Cell; Dr Sarajane Jews  . Excision metacarpal mass Right 08/17/2013    Procedure: RIGHT LONG EXCISION MASS AND DIP JOINT DEBRIDEMENT;  Surgeon: Tennis Must, MD;  Location: [facility];  Service: Orthopedics;  Laterality: Right;    Family History  Problem Relation Age of Onset  . Stroke Father     > 33  . Diabetes Father   .  Leukemia Mother   . Cancer Mother     leukemia  . Colon cancer Brother     Valve replacement  . Heart attack Brother   . Breast cancer Sister   . Ovarian cancer Sister   . Lung cancer Sister     smoker  . Prostate cancer Brother   . Cancer Brother     bladder    Social History Kristine Simmons reports that she quit smoking about 20 years ago. She does not have any smokeless tobacco history on file. Kristine Simmons reports that she does not drink alcohol.  Review of Systems  Patient denies fever, chills, headache, sweats, rash, change in hearing, chest pain, cough, urinary symptoms. All other systems are reviewed and are negative.  Physical Examination Blood pressure 111/67, pulse 65, temperature 98 F (36.7 C), temperature source Oral, resp. rate 16, height  5\' 7"  (1.702 m), weight 130 lb 8.2 oz (59.2 kg), SpO2 93.00%.  Intake/Output Summary (Last 24 hours) at 08/15/14 1007 Last data filed at 08/15/14 0026  Gross per 24 hour  Intake      0 ml  Output    200 ml  Net   -200 ml   The patient is oriented to person time and place. Affect is normal. Head is atraumatic. Sclera and conjunctiva are normal. There is no jugulovenous distention. Lungs reveal a few scattered rhonchi. There is no respiratory distress. Cardiac exam reveals S1 and S2. There is a 3/6 crescendo decrescendo systolic murmur consistent with her aortic stenosis. It does radiate to the neck. I did not evaluate her abdomen fully  as this is the pre-surgical site. There is no significant peripheral edema. There no musculoskeletal fomites. There no skin rashes.  Prior Cardiac Testing/Procedures  Lab Results  Basic Metabolic Panel:  Recent Labs Lab 08/14/14 1912 08/15/14 0455  NA 136* 137  K 4.5 4.3  CL 97 102  CO2 27 25  GLUCOSE 98 83  BUN 13 10  CREATININE 0.86 0.83  CALCIUM 9.7 8.4    Liver Function Tests:  Recent Labs Lab 08/14/14 1912 08/15/14 0455  AST 34 25  ALT 23 17  ALKPHOS 46 38*  BILITOT 0.6 0.8  PROT 6.9 5.4*  ALBUMIN 4.0 3.1*    CBC:  Recent Labs Lab 08/14/14 1912  WBC 6.5  NEUTROABS 3.7  HGB 15.4*  HCT 46.4*  MCV 87.1  PLT 185    Cardiac Enzymes: No results found for this basename: CKTOTAL, CKMB, CKMBINDEX, TROPONINI,  in the last 168 hours  BNP: No components found with this basename: POCBNP,    Radiology: Ct Abdomen Pelvis W Contrast  08/14/2014   CLINICAL DATA:  Abdominal pain today.  Previous appendectomy.  EXAM: CT ABDOMEN AND PELVIS WITH CONTRAST  TECHNIQUE: Multidetector CT imaging of the abdomen and pelvis was performed using the standard protocol following bolus administration of intravenous contrast.  CONTRAST:  145mL OMNIPAQUE IOHEXOL 300 MG/ML  SOLN  COMPARISON:  04/08/2014.  FINDINGS: Multiple mildly dilated,  fluid-filled small bowel loops with normal caliber distal loops. There is a 1.3 cm oval calcific density within 1 of the dilated small bowel loops, suggesting an ingested tablet. The dilated, fluid-filled small bowel extend into a proximal right inguinal hernia with a suggestion of normal caliber of the exiting loop. This is difficult to assess due to streak artifacts associated with a right hip prosthesis. No bowel wall thickening or pneumatosis seen.  Multiple calcified splenic granulomata. Smaller number of calcified liver granulomata. Minimal  free peritoneal fluid. Somewhat small pancreas. Unremarkable gallbladder, adrenal glands and visualized portion of the urinary bladder. Small bilateral renal cysts. Cortical scar in the lower pole of the right kidney. Lumbar and lower thoracic spine degenerative changes. No enlarged lymph nodes. No evidence of appendicitis (surgically absent by history). Atheromatous arterial calcifications. Interstitial fibrosis with some honeycombing at both lung bases posteriorly. Mild bullous changes as well.  IMPRESSION: 1. Small bowel obstruction, most likely due to entrapment within a right inguinal hernia without evidence of ischemia. 2. Minimal free peritoneal fluid. 3. COPD. These results were called by telephone at the time of interpretation on 08/14/2014 at 10:10 pm to Dr. Shirlyn Goltz , who verbally acknowledged these results.   Electronically Signed   By: Enrique Sack M.D.   On: 08/14/2014 22:11   Acute Abdominal Series  08/15/2014   CLINICAL DATA:  History bowel obstruction.  EXAM: ACUTE ABDOMEN SERIES (ABDOMEN 2 VIEW & CHEST 1 VIEW)  COMPARISON:  CT 08/14/2014 .  FINDINGS: Mediastinum and hilar structures are normal. Mild basilar atelectasis and/or infiltrates. Changes suggesting COPD. Cardiomegaly with normal pulmonary vascularity. Left shoulder replacement.  Soft tissue structures are unremarkable. Gas pattern is nonspecific. Stool noted throughout the colon. No evidence of  bowel obstruction. Calcification in pelvis consistent with phleboliths. Right hip replacement.  IMPRESSION: 1. No evidence of bowel obstruction on today's exam. 2. Mild bibasilar atelectasis and/or infiltrates.   Electronically Signed   By: Marcello Moores  Register   On: 08/15/2014 08:35    ECG:  I have reviewed her last EKG dated May, 2015. She has old decreased R wave V1 and V2. There is atrial fibrillation with a controlled rate. There are diffuse nonspecific ST-T wave changes. There is no EKG from the current admission. This will be ordered.   Impression and Recommendations    Atrial fibrillation   History of CVA      The patient's atrial fibrillation rate is controlled. She has been anticoagulated with Eliquis. There is a history of a CVA. This increases her risk of being off of anticoagulation. It is certainly appropriate to hold her Eliquis getting ready for surgery. If there is prolongation of the time that she is off this drug, she should be covered with IV heparin. After her surgery her anticoagulation needs to be started as soon as possible he is safe. This could be done with Eliquis. However, if the surgical team feels that it is safer in the postoperative period, IV heparin can be used until it is safe to restart Eliquis. The pharmacy can help with the transition between these medications.       SBO (small bowel obstruction)      The patient needs surgery. From the cardiac viewpoint she is stable for this. She is cleared for her surgery.    Aortic stenosis, mild     The patient has mild aortic stenosis. This does not had any significant risk for her at this time.    Chronic anticoagulation       I have outlined above plans concerning keeping her anticoagulated as much as possible around the time of her surgery.    Daryel November, MD  08/15/2014, 10:07 AM

## 2014-08-15 NOTE — Consult Note (Signed)
Reason for Consult: SBO/Hernia Referring Physician: Dr. Merton Border  Kristine Simmons is an 80 y.o. female.  HPI: Pt reports onset of diffuse abdominal pain starting at noon yesterday, pain was sharpe and intermittent.  No vomiting or diarrhea, +BM AM 2 days ago.  She had pain all afternoon.  Sometimes it would be worse than others, but never resolved completely.  She did get some relief lying on her abdomen with a pillow under it.  She says she fell asleep with this for a time yesterday.  She had no flatus or BM yesterday.  She has had some flatus this AM.  She say she had a similar episode back in April.  That lasted about 7 hours, and then resolved.  She was diagnosed with diverticulosis and constipation at that time.  She has been on MOM TABLETS for years and most recently went from 1 daily to 2 daily.  She feels better now and has had some flatus.  She has no pain on the current exam. She remains afebrile, labs are unremarkable.  CT scan yesterday, shows:  1. Small bowel obstruction, most likely due to entrapment within a right inguinal hernia without evidence of ischemia. 2. Minimal free peritoneal fluid. 3. COPD. The hernia was found on admission and reduced without difficulty.  She did have tenderness at a specific point yesterday where the hernia was reduced.  We are ask to see.    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 Durward Fortes  Complication of anesthesia   difficulty with arousal post op     Cystitis   Constipation/Diverticulosis  (ED visit 03/2014)       Past Surgical History  Procedure Laterality Date  . Tonsillectomy and adenoidectomy    . Vocal cord polyps    . Thyroid needle biopsy   2002  . Appendectomy    . Cataract extraction      Bilat  . Colonoscopy w/ polypectomy  2005    Diverticulosis;Dr. Sharlett Iles  . Total hip arthroplasty  2008    CHF & RAF post op   . Total shoulder replacement  2010    Dr Durward Fortes - left shoulder  . Mohs surgery  2013    nasal Basal Cell; Dr Sarajane Jews  . Excision metacarpal mass Right 08/17/2013    Procedure: RIGHT LONG EXCISION MASS AND DIP JOINT DEBRIDEMENT;  Surgeon: Tennis Must, MD;  Location: [facility];  Service: Orthopedics;  Laterality: Right;    Family History  Problem Relation Age of Onset  . Stroke Father     > 59  . Diabetes Father   . Leukemia Mother   . Cancer Mother     leukemia  . Colon cancer Brother     Valve replacement  . Heart attack Brother   . Breast cancer Sister   . Ovarian cancer Sister   . Lung cancer Sister     smoker  . Prostate cancer Brother   . Cancer Brother     bladder    Social History:  reports that she quit smoking about 20 years ago. She does not have any smokeless tobacco history on file. She reports that she does not drink alcohol or  use illicit drugs. 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 Married. Allergies:  Allergies  Allergen Reactions  . Desmopressin Acetate     REACTION: swelling feet  . Dorzolamide Hcl Other (See Comments)    Burning in eyes and redness  . Hydrocodone-Acetaminophen Nausea And Vomiting    dizzy  . Levofloxacin     REACTION: INTERFERS WITH COUMADIN  . Ciprofloxacin     REACTION: vaginal infection    Medications:  Prior to Admission:  Prescriptions prior to admission  Medication Sig Dispense Refill  . amoxicillin (AMOXIL) 500 MG capsule 4 pills 30-60 minutes pre-dental procedure  20 capsule  0  . apixaban (ELIQUIS) 2.5 MG TABS tablet Take 2.5 mg by mouth 2 (two) times daily.  last dose 08/14/14    . B Complex Vitamins (VITAMIN B COMPLEX) TABS Take 1 tablet by mouth  daily.       . bimatoprost (LUMIGAN) 0.03 % ophthalmic solution Place 1 drop into both eyes at bedtime.       . Calcium Carbonate-Vitamin D (CALCIUM-VITAMIN D) 500-200 MG-UNIT per tablet Take 1 tablet by mouth daily.       . Cholecalciferol (VITAMIN D) 2000 UNITS tablet Take 2,000 Units by mouth daily.      . Cranberry (CRANBERRY CONCENTRATE) 500 MG CAPS Take 2 capsules by mouth daily.        . digoxin (LANOXIN) 0.125 MG tablet Take 0.125 mg by mouth at bedtime.       Marland Kitchen diltiazem (CARDIZEM CD) 120 MG 24 hr capsule Take 120 mg by mouth daily.      . diphenhydramine-acetaminophen (TYLENOL PM) 25-500 MG TABS as needed. Infrequently      . ENSURE PLUS (ENSURE PLUS) LIQD Take 1 Can by mouth daily.  24 Can  11  . estradiol (ESTRACE) 0.1 MG/GM vaginal cream Place 2 g vaginally. Twice weekly      . ezetimibe-simvastatin (VYTORIN) 10-40 MG per tablet Take 1 tablet by mouth at bedtime.       Marland Kitchen levothyroxine (SYNTHROID, LEVOTHROID) 50 MCG tablet Take 50 mcg by mouth daily before breakfast.      . LORazepam (ATIVAN) 1 MG tablet Take 0.5 mg by mouth every 8 (eight) hours as needed for anxiety or sleep.      . Magnesium Oxide 500 MG TABS Take 1 tablet by mouth at bedtime.   NOw up to BID    . Multiple Vitamin (MULTIVITAMIN) tablet Take 1 tablet by mouth daily. Super Multivit      . Probiotic Product (MISC INTESTINAL FLORA REGULAT) CAPS Take 1 capsule by mouth daily.        . timolol (TIMOPTIC) 0.5 % ophthalmic solution Place 1 drop into both eyes 2 (two) times daily.        Scheduled: . sodium chloride   Intravenous STAT  . digoxin  0.125 mg Oral QHS  . diltiazem  120 mg Oral Daily  . latanoprost  1 drop Both Eyes QHS  . levothyroxine  50 mcg Oral QAC breakfast  . pantoprazole (PROTONIX) IV  40 mg Intravenous Q24H  . timolol  1 drop Both Eyes BID   Continuous: . sodium chloride 75 mL/hr at 08/15/14 0026   [id number], acetaminophen, LORazepam, morphine injection, ondansetron (ZOFRAN) IV,  ondansetron, oxyCODONE, zolpidem Anti-infectives   None      Results for orders placed during the hospital encounter of 08/14/14 (from the past 48 hour(s))  CBC WITH DIFFERENTIAL     Status: Abnormal   Collection Time    08/14/14  7:12 PM      Result Value Ref Range   WBC 6.5  4.0 - 10.5 K/uL   RBC 5.33 (*) 3.87 - 5.11 MIL/uL   Hemoglobin 15.4 (*) 12.0 - 15.0 g/dL   HCT 46.4 (*) 36.0 - 46.0 %   MCV 87.1  78.0 - 100.0 fL   MCH 28.9  26.0 - 34.0 pg   MCHC 33.2  30.0 - 36.0 g/dL   RDW 14.4  11.5 - 15.5 %   Platelets 185  150 - 400 K/uL   Neutrophils Relative % 58  43 - 77 %   Neutro Abs 3.7  1.7 - 7.7 K/uL   Lymphocytes Relative 29  12 - 46 %   Lymphs Abs 1.9  0.7 - 4.0 K/uL   Monocytes Relative 11  3 - 12 %   Monocytes Absolute 0.7  0.1 - 1.0 K/uL   Eosinophils Relative 1  0 - 5 %   Eosinophils Absolute 0.1  0.0 - 0.7 K/uL   Basophils Relative 1  0 - 1 %   Basophils Absolute 0.1  0.0 - 0.1 K/uL  COMPREHENSIVE METABOLIC PANEL     Status: Abnormal   Collection Time    08/14/14  7:12 PM      Result Value Ref Range   Sodium 136 (*) 137 - 147 mEq/L   Potassium 4.5  3.7 - 5.3 mEq/L   Chloride 97  96 - 112 mEq/L   CO2 27  19 - 32 mEq/L   Glucose, Bld 98  70 - 99 mg/dL   BUN 13  6 - 23 mg/dL   Creatinine, Ser 0.86  0.50 - 1.10 mg/dL   Calcium 9.7  8.4 - 10.5 mg/dL   Total Protein 6.9  6.0 - 8.3 g/dL   Albumin 4.0  3.5 - 5.2 g/dL   AST 34  0 - 37 U/L   ALT 23  0 - 35 U/L   Alkaline Phosphatase 46  39 - 117 U/L   Total Bilirubin 0.6  0.3 - 1.2 mg/dL   GFR calc non Af Amer 60 (*) >90 mL/min   GFR calc Af Amer 69 (*) >90 mL/min   Comment: (NOTE)     The eGFR has been calculated using the CKD EPI equation.     This calculation has not been validated in all clinical situations.     eGFR's persistently <90 mL/min signify possible Chronic Kidney     Disease.   Anion gap 12  5 - 15  LIPASE, BLOOD     Status: None   Collection Time    08/14/14  7:12 PM      Result Value Ref  Range   Lipase 29  11 - 59 U/L  DIGOXIN LEVEL     Status: Abnormal   Collection Time    08/14/14  7:12 PM      Result Value Ref Range   Digoxin Level 0.7 (*) 0.8 - 2.0 ng/mL  URINALYSIS, ROUTINE W REFLEX MICROSCOPIC     Status: Abnormal   Collection Time    08/14/14  8:43 PM      Result Value Ref Range   Color, Urine YELLOW  YELLOW   APPearance CLOUDY (*) CLEAR   Specific Gravity, Urine 1.010  1.005 - 1.030   pH 7.5  5.0 - 8.0   Glucose, UA NEGATIVE  NEGATIVE mg/dL  Hgb urine dipstick NEGATIVE  NEGATIVE   Bilirubin Urine NEGATIVE  NEGATIVE   Ketones, ur NEGATIVE  NEGATIVE mg/dL   Protein, ur NEGATIVE  NEGATIVE mg/dL   Urobilinogen, UA 0.2  0.0 - 1.0 mg/dL   Nitrite NEGATIVE  NEGATIVE   Leukocytes, UA TRACE (*) NEGATIVE  URINE MICROSCOPIC-ADD ON     Status: None   Collection Time    08/14/14  8:43 PM      Result Value Ref Range   Squamous Epithelial / LPF RARE  RARE   WBC, UA 3-6  <3 WBC/hpf   Urine-Other AMORPHOUS URATES/PHOSPHATES    COMPREHENSIVE METABOLIC PANEL     Status: Abnormal   Collection Time    08/15/14  4:55 AM      Result Value Ref Range   Sodium 137  137 - 147 mEq/L   Potassium 4.3  3.7 - 5.3 mEq/L   Chloride 102  96 - 112 mEq/L   CO2 25  19 - 32 mEq/L   Glucose, Bld 83  70 - 99 mg/dL   BUN 10  6 - 23 mg/dL   Creatinine, Ser 0.83  0.50 - 1.10 mg/dL   Calcium 8.4  8.4 - 10.5 mg/dL   Total Protein 5.4 (*) 6.0 - 8.3 g/dL   Albumin 3.1 (*) 3.5 - 5.2 g/dL   AST 25  0 - 37 U/L   ALT 17  0 - 35 U/L   Alkaline Phosphatase 38 (*) 39 - 117 U/L   Total Bilirubin 0.8  0.3 - 1.2 mg/dL   GFR calc non Af Amer 63 (*) >90 mL/min   GFR calc Af Amer 72 (*) >90 mL/min   Comment: (NOTE)     The eGFR has been calculated using the CKD EPI equation.     This calculation has not been validated in all clinical situations.     eGFR's persistently <90 mL/min signify possible Chronic Kidney     Disease.   Anion gap 10  5 - 15    Ct Abdomen Pelvis W Contrast  08/14/2014    CLINICAL DATA:  Abdominal pain today.  Previous appendectomy.  EXAM: CT ABDOMEN AND PELVIS WITH CONTRAST  TECHNIQUE: Multidetector CT imaging of the abdomen and pelvis was performed using the standard protocol following bolus administration of intravenous contrast.  CONTRAST:  165m OMNIPAQUE IOHEXOL 300 MG/ML  SOLN  COMPARISON:  04/08/2014.  FINDINGS: Multiple mildly dilated, fluid-filled small bowel loops with normal caliber distal loops. There is a 1.3 cm oval calcific density within 1 of the dilated small bowel loops, suggesting an ingested tablet. The dilated, fluid-filled small bowel extend into a proximal right inguinal hernia with a suggestion of normal caliber of the exiting loop. This is difficult to assess due to streak artifacts associated with a right hip prosthesis. No bowel wall thickening or pneumatosis seen.  Multiple calcified splenic granulomata. Smaller number of calcified liver granulomata. Minimal free peritoneal fluid. Somewhat small pancreas. Unremarkable gallbladder, adrenal glands and visualized portion of the urinary bladder. Small bilateral renal cysts. Cortical scar in the lower pole of the right kidney. Lumbar and lower thoracic spine degenerative changes. No enlarged lymph nodes. No evidence of appendicitis (surgically absent by history). Atheromatous arterial calcifications. Interstitial fibrosis with some honeycombing at both lung bases posteriorly. Mild bullous changes as well.  IMPRESSION: 1. Small bowel obstruction, most likely due to entrapment within a right inguinal hernia without evidence of ischemia. 2. Minimal free peritoneal fluid. 3. COPD. These results were  called by telephone at the time of interpretation on 08/14/2014 at 10:10 pm to Dr. Shirlyn Goltz , who verbally acknowledged these results.   Electronically Signed   By: Enrique Sack M.D.   On: 08/14/2014 22:11    Review of Systems  Constitutional: Negative.   HENT: Negative.   Eyes:       Blind left eye, with very  significant glaucoma on the right.with poor vision.  Respiratory: Positive for cough (usually thick but clear.) and sputum production. Negative for hemoptysis, shortness of breath and wheezing.   Cardiovascular: Positive for palpitations. Negative for leg swelling.  Gastrointestinal: Positive for abdominal pain (diffuse abdominal pain started yesterday around lunch.   Persisted, all afternoon.  ) and constipation (chronic she has been on MOM tablets for years, recently increased to 2 per day, by her naturalist.). Negative for nausea, vomiting and diarrhea.  Genitourinary:       Slow to start urine.  Musculoskeletal: Positive for joint pain.  Skin: Negative.   Neurological:       She has siginficant balance issues and uses her husband as  Kasandra Knudsen when she is out of yard.  Endo/Heme/Allergies: Bruises/bleeds easily (She is on Eliquis).  Psychiatric/Behavioral: Negative.    Blood pressure 111/67, pulse 65, temperature 98 F (36.7 C), temperature source Oral, resp. rate 16, height 5' 7"  (1.702 m), weight 59.2 kg (130 lb 8.2 oz), SpO2 93.00%. Physical Exam  Constitutional: She is oriented to person, place, and time. She appears well-developed and well-nourished. No distress.  HENT:  Head: Normocephalic and atraumatic.  Nose: Nose normal.  Eyes: Right eye exhibits no discharge. Left eye exhibits no discharge.  Left eye is dialated, with no vision.    Neck: Normal range of motion. Neck supple. No JVD present. No tracheal deviation present. No thyromegaly present.  Cardiovascular:  Murmur (III/VI systoloic mumur) heard. Heart rate is slow and irregular,  Both lower feet are well perfused but with diminished pulses.  Respiratory: Effort normal and breath sounds normal. No respiratory distress. She has no wheezes. She has no rales. She exhibits no tenderness.  GI: Soft. Bowel sounds are normal. She exhibits no distension and no mass. There is no tenderness. There is no rebound and no guarding.  She  has a right femoral hernia that is reduced at this Exam.  Musculoskeletal: She exhibits no edema.  Lymphadenopathy:    She has no cervical adenopathy.  Neurological: She is alert and oriented to person, place, and time. No cranial nerve deficit.  Skin: Skin is warm and dry. No rash noted. She is not diaphoretic. No erythema. No pallor.  Psychiatric: She has a normal mood and affect. Her behavior is normal. Judgment and thought content normal.    Assessment/Plan: 1.  SBO with right femoral hernia 2.  Atrial fibrillation on Eliquis (last dose 6 PM 08/15/14) 3.  Hx of CVA, with blindness left eye 4.  Hx of tobacco use/COPD 5.  Mild Aortic and mitral stenosis with normal EF on Echo 06/06/14 6.  Hyperlipidemia 7.  Hypothyroid 8.  Limited mobility/balance and vision issues 9.  S/p Right hip, left shoulder surgeries, with right knee limitations. 10. Chronic constipation issues 11.  Recurrent UTI's she is on antibiotics QOD for this, it sounds like Septra, but she isn't sure at this point.  Plan:  Her hernia is reduced and she is pain free at this point.  She has good bowel sounds with some flatus.  A film is currently pending for today.  I would hold her Eliquis and see how she does.  She may need this fixed, if not acutely, soon to prevent further events.  She would need to be off anticoagulation and have medical clearance prior to any surgery.  We will follow with you.   Joanette Silveria 08/15/2014, 7:20 AM

## 2014-08-15 NOTE — Progress Notes (Signed)
[facility] PROGRESS NOTE Assessment/Plan: Femoral hernia with obstruction/SBO (small bowel obstruction): - SBO has resolved, surgery consulted. - Hold Eliquis  for surgical proccedure,she does have a h/o CVA in 2009. Cardiology consulted. - Mild AS on ECHO on 6.2015. A fib check and EKG - Pt is moderate risk for cardiopulmonary complication I have discuss with pt risk and benefits, she agrees and wil like to proceed with surgery. - hold eliquis 2 days. - ? To Surgery when to resume anticoagulation.   Chronic anticoagulation due to  Atrial fibrillation - see above. - off eluquis for surgical procedure.   Hypothyroidism:  Code Status: full Family Communication: none  Disposition Plan: inpatient   Consultants:  Cardiology  surgery  Procedures:  Ct abd and pelvis  Antibiotics:  None  HPI/Subjective: No complains  Objective: Filed Vitals:   08/14/14 1842 08/14/14 2040 08/15/14 0014 08/15/14 0458  BP: 173/90 152/83 169/83 111/67  Pulse: 63 61 72 65  Temp: 97.8 F (36.6 C)  97.6 F (36.4 C) 98 F (36.7 C)  TempSrc: Oral  Oral Oral  Resp: 18  16 16   Height:    5\' 7"  (1.702 m)  Weight:    59.2 kg (130 lb 8.2 oz)  SpO2: 93% 95% 91% 93%    Intake/Output Summary (Last 24 hours) at 08/15/14 1011 Last data filed at 08/15/14 0026  Gross per 24 hour  Intake      0 ml  Output    200 ml  Net   -200 ml   Filed Weights   08/15/14 0458  Weight: 59.2 kg (130 lb 8.2 oz)    Exam:  General: Alert, awake, oriented x3, in no acute distress.  HEENT: No bruits, no goiter.  Heart: Regular rate and rhythm. Lungs: Good air movement, clear Abdomen: Soft, nontender, nondistended, positive bowel sounds.     Data Reviewed: Basic Metabolic Panel:  Recent Labs Lab 08/14/14 1912 08/15/14 0455  NA 136* 137  K 4.5 4.3  CL 97 102  CO2 27 25  GLUCOSE 98 83  BUN 13 10  CREATININE 0.86 0.83  CALCIUM 9.7 8.4   Liver Function Tests:  Recent Labs Lab  08/14/14 1912 08/15/14 0455  AST 34 25  ALT 23 17  ALKPHOS 46 38*  BILITOT 0.6 0.8  PROT 6.9 5.4*  ALBUMIN 4.0 3.1*    Recent Labs Lab 08/14/14 1912  LIPASE 29   No results found for this basename: AMMONIA,  in the last 168 hours CBC:  Recent Labs Lab 08/14/14 1912  WBC 6.5  NEUTROABS 3.7  HGB 15.4*  HCT 46.4*  MCV 87.1  PLT 185   Cardiac Enzymes: No results found for this basename: CKTOTAL, CKMB, CKMBINDEX, TROPONINI,  in the last 168 hours BNP (last 3 results) No results found for this basename: PROBNP,  in the last 8760 hours CBG: No results found for this basename: GLUCAP,  in the last 168 hours  No results found for this or any previous visit (from the past 240 hour(s)).   Studies: Ct Abdomen Pelvis W Contrast  08/14/2014   CLINICAL DATA:  Abdominal pain today.  Previous appendectomy.  EXAM: CT ABDOMEN AND PELVIS WITH CONTRAST  TECHNIQUE: Multidetector CT imaging of the abdomen and pelvis was performed using the standard protocol following bolus administration of intravenous contrast.  CONTRAST:  114mL OMNIPAQUE IOHEXOL 300 MG/ML  SOLN  COMPARISON:  04/08/2014.  FINDINGS: Multiple mildly dilated, fluid-filled small bowel loops with normal caliber distal loops. There  is a 1.3 cm oval calcific density within 1 of the dilated small bowel loops, suggesting an ingested tablet. The dilated, fluid-filled small bowel extend into a proximal right inguinal hernia with a suggestion of normal caliber of the exiting loop. This is difficult to assess due to streak artifacts associated with a right hip prosthesis. No bowel wall thickening or pneumatosis seen.  Multiple calcified splenic granulomata. Smaller number of calcified liver granulomata. Minimal free peritoneal fluid. Somewhat small pancreas. Unremarkable gallbladder, adrenal glands and visualized portion of the urinary bladder. Small bilateral renal cysts. Cortical scar in the lower pole of the right kidney. Lumbar and lower  thoracic spine degenerative changes. No enlarged lymph nodes. No evidence of appendicitis (surgically absent by history). Atheromatous arterial calcifications. Interstitial fibrosis with some honeycombing at both lung bases posteriorly. Mild bullous changes as well.  IMPRESSION: 1. Small bowel obstruction, most likely due to entrapment within a right inguinal hernia without evidence of ischemia. 2. Minimal free peritoneal fluid. 3. COPD. These results were called by telephone at the time of interpretation on 08/14/2014 at 10:10 pm to Dr. Shirlyn Goltz , who verbally acknowledged these results.   Electronically Signed   By: Enrique Sack M.D.   On: 08/14/2014 22:11   Acute Abdominal Series  08/15/2014   CLINICAL DATA:  History bowel obstruction.  EXAM: ACUTE ABDOMEN SERIES (ABDOMEN 2 VIEW & CHEST 1 VIEW)  COMPARISON:  CT 08/14/2014 .  FINDINGS: Mediastinum and hilar structures are normal. Mild basilar atelectasis and/or infiltrates. Changes suggesting COPD. Cardiomegaly with normal pulmonary vascularity. Left shoulder replacement.  Soft tissue structures are unremarkable. Gas pattern is nonspecific. Stool noted throughout the colon. No evidence of bowel obstruction. Calcification in pelvis consistent with phleboliths. Right hip replacement.  IMPRESSION: 1. No evidence of bowel obstruction on today's exam. 2. Mild bibasilar atelectasis and/or infiltrates.   Electronically Signed   By: Marcello Moores  Register   On: 08/15/2014 08:35    Scheduled Meds: . sodium chloride   Intravenous STAT  . digoxin  0.125 mg Oral QHS  . diltiazem  120 mg Oral Daily  . latanoprost  1 drop Both Eyes QHS  . levothyroxine  50 mcg Oral QAC breakfast  . pantoprazole (PROTONIX) IV  40 mg Intravenous Q24H  . timolol  1 drop Both Eyes BID   Continuous Infusions: . sodium chloride 75 mL/hr at 08/15/14 0026     Charlynne Cousins  [facility] Pager [phone]. If 8PM-8AM, please contact night-coverage at [email or web address], password  [facility] 08/15/2014, 10:11 AM  LOS: 1 day      **Disclaimer: This note may have been dictated with voice recognition software. Similar sounding words can inadvertently be transcribed and this note may contain transcription errors which may not have been corrected upon publication of note.**

## 2014-08-16 DIAGNOSIS — Z7901 Long term (current) use of anticoagulants: Secondary | ICD-10-CM

## 2014-08-16 MED ORDER — POLYETHYLENE GLYCOL 3350 17 G PO PACK
17.0000 g | PACK | Freq: Once | ORAL | Status: AC
  Administered 2014-08-16: 17 g via ORAL
  Filled 2014-08-16: qty 1

## 2014-08-16 MED ORDER — LORAZEPAM 0.5 MG PO TABS: 0.5000 mg | ORAL_TABLET | Freq: Three times a day (TID) | ORAL | Status: DC | PRN

## 2014-08-16 MED ORDER — CEFAZOLIN SODIUM-DEXTROSE 2-3 GM-% IV SOLR: 2.0000 g | INTRAVENOUS | Status: DC

## 2014-08-16 MED ORDER — CHLORHEXIDINE GLUCONATE 4 % EX LIQD: 1.0000 "application " | Freq: Once | CUTANEOUS | Status: DC

## 2014-08-16 MED ORDER — CEFAZOLIN SODIUM-DEXTROSE 2-3 GM-% IV SOLR
2.0000 g | INTRAVENOUS | Status: AC
  Administered 2014-08-17: 2 g via INTRAVENOUS
  Filled 2014-08-16: qty 50

## 2014-08-16 NOTE — Progress Notes (Signed)
     SUBJECTIVE: No chest pain or SOB. Feels better.   BP 113/65  Pulse 67  Temp(Src) 97.9 F (36.6 C) (Oral)  Resp 16  Ht 5\' 7"  (1.702 m)  Wt 130 lb 8.2 oz (59.2 kg)  BMI 20.44 kg/m2  SpO2 95%  Intake/Output Summary (Last 24 hours) at 08/16/14 6294 Last data filed at 08/16/14 0600  Gross per 24 hour  Intake 2457.5 ml  Output   3600 ml  Net -1142.5 ml    PHYSICAL EXAM General: Well developed, well nourished, in no acute distress. Alert and oriented x 3.  Psych:  Good affect, responds appropriately Neck: No JVD. No masses noted.  Lungs: Clear bilaterally with no wheezes or rhonci noted.  Heart: irreg irreg  with systolic murmur noted. Abdomen: Bowel sounds are present. Soft, non-tender.  Extremities: No lower extremity edema.   LABS: Basic Metabolic Panel:  Recent Labs  08/14/14 1912 08/15/14 0455  NA 136* 137  K 4.5 4.3  CL 97 102  CO2 27 25  GLUCOSE 98 83  BUN 13 10  CREATININE 0.86 0.83  CALCIUM 9.7 8.4   CBC:  Recent Labs  08/14/14 1912  WBC 6.5  NEUTROABS 3.7  HGB 15.4*  HCT 46.4*  MCV 87.1  PLT 185   Current Meds: . digoxin  0.125 mg Oral QHS  . diltiazem  120 mg Oral Daily  . latanoprost  1 drop Both Eyes QHS  . levothyroxine  50 mcg Oral QAC breakfast  . pantoprazole (PROTONIX) IV  40 mg Intravenous Q24H  . polyethylene glycol  17 g Oral Once  . timolol  1 drop Both Eyes BID    ASSESSMENT AND PLAN:  1. Small bowel obstruction: Cardiology consult yesterday per Dr. Ron Parker with cardiac clearance for surgery.   2. Atrial fib: Chronic, rate controlled. Eliquis on hold for surgery. Restart as soon as safe from surgical standpoint following surgery.    Will sign off. Please call with questions.   Kristine Simmons  8/25/20156:32 AM

## 2014-08-16 NOTE — Progress Notes (Signed)
Subjective: Feeling much better. Tolerating full liquids. Passing lots of flatus but no stool. Denies abdominal pain or groin pain. Afebrile. Heart rate 67. BP 113/65. Objective: Vital signs in last 24 hours: Temp:  [97.7 F (36.5 C)-98.1 F (36.7 C)] 97.9 F (36.6 C) (08/25 0540) Pulse Rate:  [59-74] 67 (08/25 0540) Resp:  [16] 16 (08/25 0540) BP: (113-147)/(65-80) 113/65 mmHg (08/25 0540) SpO2:  [95 %-97 %] 95 % (08/25 0540) Last BM Date: 08/14/14  Intake/Output from previous day: 08/24 0701 - 08/25 0700 In: 2457.5 [P.O.:240; I.V.:2217.5] Out: 3600 [Urine:3600] Intake/Output this shift: Total I/O In: 1200 [I.V.:1200] Out: 1100 [Urine:1100]  General appearance: alert. Thin. Cooperative. Mental status normal. In no distress. Resp: clear to auscultation bilaterally GI: abdomen is soft and nontender. Bowel sounds present. Not distended. Right groin feels normal at rest. When she coughs I can feel a reducible hernia. Overlying skin normal. Not tender.  Lab Results:   Recent Labs  08/14/14 1912  WBC 6.5  HGB 15.4*  HCT 46.4*  PLT 185   BMET  Recent Labs  08/14/14 1912 08/15/14 0455  NA 136* 137  K 4.5 4.3  CL 97 102  CO2 27 25  GLUCOSE 98 83  BUN 13 10  CREATININE 0.86 0.83  CALCIUM 9.7 8.4   PT/INR No results found for this basename: LABPROT, INR,  in the last 72 hours ABG No results found for this basename: PHART, PCO2, PO2, HCO3,  in the last 72 hours  Studies/Results: Ct Abdomen Pelvis W Contrast  08/14/2014   CLINICAL DATA:  Abdominal pain today.  Previous appendectomy.  EXAM: CT ABDOMEN AND PELVIS WITH CONTRAST  TECHNIQUE: Multidetector CT imaging of the abdomen and pelvis was performed using the standard protocol following bolus administration of intravenous contrast.  CONTRAST:  125mL OMNIPAQUE IOHEXOL 300 MG/ML  SOLN  COMPARISON:  04/08/2014.  FINDINGS: Multiple mildly dilated, fluid-filled small bowel loops with normal caliber distal loops. There  is a 1.3 cm oval calcific density within 1 of the dilated small bowel loops, suggesting an ingested tablet. The dilated, fluid-filled small bowel extend into a proximal right inguinal hernia with a suggestion of normal caliber of the exiting loop. This is difficult to assess due to streak artifacts associated with a right hip prosthesis. No bowel wall thickening or pneumatosis seen.  Multiple calcified splenic granulomata. Smaller number of calcified liver granulomata. Minimal free peritoneal fluid. Somewhat small pancreas. Unremarkable gallbladder, adrenal glands and visualized portion of the urinary bladder. Small bilateral renal cysts. Cortical scar in the lower pole of the right kidney. Lumbar and lower thoracic spine degenerative changes. No enlarged lymph nodes. No evidence of appendicitis (surgically absent by history). Atheromatous arterial calcifications. Interstitial fibrosis with some honeycombing at both lung bases posteriorly. Mild bullous changes as well.  IMPRESSION: 1. Small bowel obstruction, most likely due to entrapment within a right inguinal hernia without evidence of ischemia. 2. Minimal free peritoneal fluid. 3. COPD. These results were called by telephone at the time of interpretation on 08/14/2014 at 10:10 pm to Dr. Shirlyn Goltz , who verbally acknowledged these results.   Electronically Signed   By: Enrique Sack M.D.   On: 08/14/2014 22:11   Acute Abdominal Series  08/15/2014   CLINICAL DATA:  History bowel obstruction.  EXAM: ACUTE ABDOMEN SERIES (ABDOMEN 2 VIEW & CHEST 1 VIEW)  COMPARISON:  CT 08/14/2014 .  FINDINGS: Mediastinum and hilar structures are normal. Mild basilar atelectasis and/or infiltrates. Changes suggesting COPD. Cardiomegaly with normal pulmonary  vascularity. Left shoulder replacement.  Soft tissue structures are unremarkable. Gas pattern is nonspecific. Stool noted throughout the colon. No evidence of bowel obstruction. Calcification in pelvis consistent with phleboliths.  Right hip replacement.  IMPRESSION: 1. No evidence of bowel obstruction on today's exam. 2. Mild bibasilar atelectasis and/or infiltrates.   Electronically Signed   By: Marcello Moores  Register   On: 08/15/2014 08:35    Anti-infectives: Anti-infectives   None      Assessment/Plan:  1. SBO with right femoral hernia. Hernia reduced. Clinically, SBO has resolved.  Advance diet. One dose of MiraLAX today. Holding Eliquis.  She is at significant risk for recurrent incarceration, obstruction, and possible coagulation from her right groin hernia.  I had another long discussion with her about her right femoral hernia and the fact that it was most likely the cause of her transient obstruction. I offered to repair this during this admission and she would like to go ahead with the surgery. We'll plan to proceed with open repair of right femoral hernia with mesh tomorrow. It will be almost 3 days since she took her last Eliquis. We plan to restart Eliquis the following day. Discussed with Dr. Richardean Canal) who is in agreement.  I discussed the indications, details, techniques, and numerous risk of right femoral hernia repair with her. She's aware of the risk of bleeding, infection, recurrence, nerve damage with chronic pain, injury to adjacent organs such as intestine or bladder was major reconstructive surgery, cardiac, pulmonary, and thromboembolic problems. She understands all these issues well. All of her questions are answered.  . She agrees with this plan.  2. Atrial fibrillation on Eliquis (last dose 6 PM 08/15/14).  3. Hx of CVA, with blindness left eye  4. Hx of tobacco use/COPD  5. Mild Aortic and mitral stenosis with normal EF on Echo 06/06/14  6. Hyperlipidemia  7. Hypothyroid  8. Limited mobility/balance and vision issues  9. S/p Right hip, left shoulder surgeries, with right knee limitations.  10. Chronic constipation issues  11. Recurrent UTI's she is on antibiotics QOD for this, it sounds  like Septra, but she isn't sure at this point.    LOS: 2 days    Lynford Espinoza M 08/16/2014

## 2014-08-16 NOTE — Progress Notes (Signed)
[facility] PROGRESS NOTE Assessment/Plan: Femoral hernia with obstruction/SBO (small bowel obstruction): - SBO has resolved, surgery consulted, for procedure in am. - Hold Eliquis  for surgical proccedure,she does have a h/o CVA in 2009.  - Mild AS on ECHO on 6.2015. A fib check and EKG - Pt is moderate risk for cardiopulmonary complication I have discuss with pt risk and benefits, she agrees and wil like to proceed with surgery. - ? To Surgery when to resume anticoagulation.   Chronic anticoagulation due to  Atrial fibrillation - see above. - off eluquis for surgical procedure.   Hypothyroidism:  Code Status: full Family Communication: none  Disposition Plan: inpatient   Consultants:  Cardiology  surgery  Procedures:  Ct abd and pelvis  Antibiotics:  None  HPI/Subjective: No complains  Objective: Filed Vitals:   08/15/14 1345 08/15/14 2123 08/16/14 0540 08/16/14 1000  BP: 128/79 123/80 113/65 122/71  Pulse: 62 59 67 64  Temp: 98.1 F (36.7 C) 97.7 F (36.5 C) 97.9 F (36.6 C) 98.1 F (36.7 C)  TempSrc: Oral Oral Oral Oral  Resp: 16 16 16 22   Height:      Weight:      SpO2: 97% 95% 95% 92%    Intake/Output Summary (Last 24 hours) at 08/16/14 1137 Last data filed at 08/16/14 0948  Gross per 24 hour  Intake   2275 ml  Output   3700 ml  Net  -1425 ml   Filed Weights   08/15/14 0458  Weight: 59.2 kg (130 lb 8.2 oz)    Exam:  General: Alert, awake, oriented x3, in no acute distress.  HEENT: No bruits, no goiter.  Heart: Regular rate and rhythm. Lungs: Good air movement, clear Abdomen: Soft, nontender, nondistended, positive bowel sounds.     Data Reviewed: Basic Metabolic Panel:  Recent Labs Lab 08/14/14 1912 08/15/14 0455  NA 136* 137  K 4.5 4.3  CL 97 102  CO2 27 25  GLUCOSE 98 83  BUN 13 10  CREATININE 0.86 0.83  CALCIUM 9.7 8.4   Liver Function Tests:  Recent Labs Lab 08/14/14 1912 08/15/14 0455  AST 34 25    ALT 23 17  ALKPHOS 46 38*  BILITOT 0.6 0.8  PROT 6.9 5.4*  ALBUMIN 4.0 3.1*    Recent Labs Lab 08/14/14 1912  LIPASE 29   No results found for this basename: AMMONIA,  in the last 168 hours CBC:  Recent Labs Lab 08/14/14 1912  WBC 6.5  NEUTROABS 3.7  HGB 15.4*  HCT 46.4*  MCV 87.1  PLT 185   Cardiac Enzymes: No results found for this basename: CKTOTAL, CKMB, CKMBINDEX, TROPONINI,  in the last 168 hours BNP (last 3 results) No results found for this basename: PROBNP,  in the last 8760 hours CBG: No results found for this basename: GLUCAP,  in the last 168 hours  No results found for this or any previous visit (from the past 240 hour(s)).   Studies: Ct Abdomen Pelvis W Contrast  08/14/2014   CLINICAL DATA:  Abdominal pain today.  Previous appendectomy.  EXAM: CT ABDOMEN AND PELVIS WITH CONTRAST  TECHNIQUE: Multidetector CT imaging of the abdomen and pelvis was performed using the standard protocol following bolus administration of intravenous contrast.  CONTRAST:  151mL OMNIPAQUE IOHEXOL 300 MG/ML  SOLN  COMPARISON:  04/08/2014.  FINDINGS: Multiple mildly dilated, fluid-filled small bowel loops with normal caliber distal loops. There is a 1.3 cm oval calcific density within 1 of the  dilated small bowel loops, suggesting an ingested tablet. The dilated, fluid-filled small bowel extend into a proximal right inguinal hernia with a suggestion of normal caliber of the exiting loop. This is difficult to assess due to streak artifacts associated with a right hip prosthesis. No bowel wall thickening or pneumatosis seen.  Multiple calcified splenic granulomata. Smaller number of calcified liver granulomata. Minimal free peritoneal fluid. Somewhat small pancreas. Unremarkable gallbladder, adrenal glands and visualized portion of the urinary bladder. Small bilateral renal cysts. Cortical scar in the lower pole of the right kidney. Lumbar and lower thoracic spine degenerative changes. No  enlarged lymph nodes. No evidence of appendicitis (surgically absent by history). Atheromatous arterial calcifications. Interstitial fibrosis with some honeycombing at both lung bases posteriorly. Mild bullous changes as well.  IMPRESSION: 1. Small bowel obstruction, most likely due to entrapment within a right inguinal hernia without evidence of ischemia. 2. Minimal free peritoneal fluid. 3. COPD. These results were called by telephone at the time of interpretation on 08/14/2014 at 10:10 pm to Dr. Shirlyn Goltz , who verbally acknowledged these results.   Electronically Signed   By: Enrique Sack M.D.   On: 08/14/2014 22:11   Acute Abdominal Series  08/15/2014   CLINICAL DATA:  History bowel obstruction.  EXAM: ACUTE ABDOMEN SERIES (ABDOMEN 2 VIEW & CHEST 1 VIEW)  COMPARISON:  CT 08/14/2014 .  FINDINGS: Mediastinum and hilar structures are normal. Mild basilar atelectasis and/or infiltrates. Changes suggesting COPD. Cardiomegaly with normal pulmonary vascularity. Left shoulder replacement.  Soft tissue structures are unremarkable. Gas pattern is nonspecific. Stool noted throughout the colon. No evidence of bowel obstruction. Calcification in pelvis consistent with phleboliths. Right hip replacement.  IMPRESSION: 1. No evidence of bowel obstruction on today's exam. 2. Mild bibasilar atelectasis and/or infiltrates.   Electronically Signed   By: Marcello Moores  Register   On: 08/15/2014 08:35    Scheduled Meds: . digoxin  0.125 mg Oral QHS  . diltiazem  120 mg Oral Daily  . latanoprost  1 drop Both Eyes QHS  . levothyroxine  50 mcg Oral QAC breakfast  . pantoprazole (PROTONIX) IV  40 mg Intravenous Q24H  . timolol  1 drop Both Eyes BID   Continuous Infusions: . sodium chloride 75 mL/hr at 08/16/14 0600     Charlynne Cousins  [facility] Pager [phone]. If 8PM-8AM, please contact night-coverage at [email or web address], password [facility] 08/16/2014, 11:37 AM  LOS: 2 days      **Disclaimer: This note may have  been dictated with voice recognition software. Similar sounding words can inadvertently be transcribed and this note may contain transcription errors which may not have been corrected upon publication of note.**

## 2014-08-17 ENCOUNTER — Encounter (HOSPITAL_COMMUNITY): Admission: EM | Disposition: A | Discharge status: Home Health | Payer: Self-pay | Source: Home / Self Care

## 2014-08-17 ENCOUNTER — Encounter (HOSPITAL_COMMUNITY): Payer: Medicare Other | Admitting: Certified Registered Nurse Anesthetist

## 2014-08-17 ENCOUNTER — Encounter (HOSPITAL_COMMUNITY): Payer: Self-pay | Admitting: Certified Registered Nurse Anesthetist

## 2014-08-17 ENCOUNTER — Inpatient Hospital Stay (HOSPITAL_COMMUNITY): Payer: Medicare Other | Admitting: Certified Registered Nurse Anesthetist

## 2014-08-17 HISTORY — PX: INSERTION OF MESH: SHX5868

## 2014-08-17 HISTORY — PX: FEMORAL HERNIA REPAIR: SHX632

## 2014-08-17 LAB — SURGICAL PCR SCREEN
MRSA, PCR: NEGATIVE
Staphylococcus aureus: NEGATIVE

## 2014-08-17 SURGERY — HERNIA REPAIR FEMORAL
Anesthesia: General | Laterality: Right

## 2014-08-17 MED ORDER — ONDANSETRON HCL 4 MG/2ML IJ SOLN
INTRAMUSCULAR | Status: DC | PRN
  Administered 2014-08-17: 4 mg via INTRAVENOUS

## 2014-08-17 MED ORDER — GLYCOPYRROLATE 0.2 MG/ML IJ SOLN
INTRAMUSCULAR | Status: DC | PRN
  Administered 2014-08-17: 0.2 mg via INTRAVENOUS
  Administered 2014-08-17: 0.6 mg via INTRAVENOUS

## 2014-08-17 MED ORDER — ENOXAPARIN SODIUM 30 MG/0.3ML ~~LOC~~ SOLN
30.0000 mg | SUBCUTANEOUS | Status: DC
Start: 2014-08-18 — End: 2014-08-17

## 2014-08-17 MED ORDER — LIDOCAINE HCL (CARDIAC) 20 MG/ML IV SOLN
INTRAVENOUS | Status: DC | PRN
  Administered 2014-08-17: 100 mg via INTRAVENOUS

## 2014-08-17 MED ORDER — HYDROMORPHONE HCL PF 1 MG/ML IJ SOLN
0.2500 mg | INTRAMUSCULAR | Status: DC | PRN
  Administered 2014-08-17: 0.5 mg via INTRAVENOUS

## 2014-08-17 MED ORDER — ONDANSETRON HCL 4 MG PO TABS: 4.0000 mg | ORAL_TABLET | Freq: Four times a day (QID) | ORAL | Status: DC | PRN

## 2014-08-17 MED ORDER — OXYCODONE-ACETAMINOPHEN 5-325 MG PO TABS
1.0000 | ORAL_TABLET | ORAL | Status: DC | PRN
  Administered 2014-08-17 – 2014-08-18 (×3): 2 via ORAL
  Administered 2014-08-19: 1 via ORAL
  Administered 2014-08-19 – 2014-08-20 (×2): 2 via ORAL
  Filled 2014-08-17: qty 1
  Filled 2014-08-17 (×2): qty 2
  Filled 2014-08-17: qty 1
  Filled 2014-08-17 (×3): qty 2

## 2014-08-17 MED ORDER — POTASSIUM CHLORIDE IN NACL 20-0.9 MEQ/L-% IV SOLN
INTRAVENOUS | Status: DC
  Administered 2014-08-17 – 2014-08-19 (×2): via INTRAVENOUS
  Filled 2014-08-17 (×3): qty 1000

## 2014-08-17 MED ORDER — HYDROMORPHONE HCL PF 1 MG/ML IJ SOLN
INTRAMUSCULAR | Status: AC
  Filled 2014-08-17: qty 1

## 2014-08-17 MED ORDER — EPHEDRINE SULFATE 50 MG/ML IJ SOLN
INTRAMUSCULAR | Status: DC | PRN
  Administered 2014-08-17 (×2): 10 mg via INTRAVENOUS

## 2014-08-17 MED ORDER — DEXAMETHASONE SODIUM PHOSPHATE 10 MG/ML IJ SOLN
INTRAMUSCULAR | Status: DC | PRN
  Administered 2014-08-17: 10 mg via INTRAVENOUS

## 2014-08-17 MED ORDER — PROMETHAZINE HCL 25 MG/ML IJ SOLN: 6.2500 mg | INTRAMUSCULAR | Status: DC | PRN

## 2014-08-17 MED ORDER — 0.9 % SODIUM CHLORIDE (POUR BTL) OPTIME
TOPICAL | Status: DC | PRN
  Administered 2014-08-17: 1000 mL

## 2014-08-17 MED ORDER — EPHEDRINE SULFATE 50 MG/ML IJ SOLN
INTRAMUSCULAR | Status: AC
  Filled 2014-08-17: qty 1

## 2014-08-17 MED ORDER — BUPIVACAINE-EPINEPHRINE (PF) 0.5% -1:200000 IJ SOLN
INTRAMUSCULAR | Status: AC
  Filled 2014-08-17: qty 30

## 2014-08-17 MED ORDER — GLYCOPYRROLATE 0.2 MG/ML IJ SOLN
INTRAMUSCULAR | Status: AC
  Filled 2014-08-17: qty 1

## 2014-08-17 MED ORDER — FENTANYL CITRATE 0.05 MG/ML IJ SOLN
INTRAMUSCULAR | Status: AC
  Filled 2014-08-17: qty 2

## 2014-08-17 MED ORDER — BUPIVACAINE-EPINEPHRINE 0.5% -1:200000 IJ SOLN
INTRAMUSCULAR | Status: DC | PRN
  Administered 2014-08-17: 17 mL

## 2014-08-17 MED ORDER — NEOSTIGMINE METHYLSULFATE 10 MG/10ML IV SOLN
INTRAVENOUS | Status: DC | PRN
  Administered 2014-08-17: 5 mg via INTRAVENOUS

## 2014-08-17 MED ORDER — ONDANSETRON HCL 4 MG/2ML IJ SOLN
INTRAMUSCULAR | Status: AC
  Filled 2014-08-17: qty 2

## 2014-08-17 MED ORDER — ROCURONIUM BROMIDE 100 MG/10ML IV SOLN
INTRAVENOUS | Status: DC | PRN
  Administered 2014-08-17: 20 mg via INTRAVENOUS

## 2014-08-17 MED ORDER — FENTANYL CITRATE 0.05 MG/ML IJ SOLN
INTRAMUSCULAR | Status: DC | PRN
Start: 2014-08-17 — End: 2014-08-17
  Administered 2014-08-17: 50 ug via INTRAVENOUS

## 2014-08-17 MED ORDER — DEXAMETHASONE SODIUM PHOSPHATE 10 MG/ML IJ SOLN
INTRAMUSCULAR | Status: AC
  Filled 2014-08-17: qty 1

## 2014-08-17 MED ORDER — APIXABAN 2.5 MG PO TABS
2.5000 mg | ORAL_TABLET | Freq: Two times a day (BID) | ORAL | Status: DC
  Administered 2014-08-18 – 2014-08-21 (×7): 2.5 mg via ORAL
  Filled 2014-08-17 (×8): qty 1

## 2014-08-17 MED ORDER — CEFAZOLIN SODIUM-DEXTROSE 2-3 GM-% IV SOLR
INTRAVENOUS | Status: AC
  Filled 2014-08-17: qty 50

## 2014-08-17 MED ORDER — SUCCINYLCHOLINE CHLORIDE 20 MG/ML IJ SOLN
INTRAMUSCULAR | Status: DC | PRN
  Administered 2014-08-17: 100 mg via INTRAVENOUS

## 2014-08-17 MED ORDER — SODIUM CHLORIDE 0.9 % IJ SOLN
INTRAMUSCULAR | Status: AC
  Filled 2014-08-17: qty 10

## 2014-08-17 MED ORDER — FENTANYL CITRATE 0.05 MG/ML IJ SOLN: 12.5000 ug | INTRAMUSCULAR | Status: DC | PRN

## 2014-08-17 MED ORDER — LACTATED RINGERS IV SOLN
INTRAVENOUS | Status: DC
  Administered 2014-08-17: 13:00:00 via INTRAVENOUS
  Administered 2014-08-17: 1000 mL via INTRAVENOUS

## 2014-08-17 MED ORDER — PROPOFOL 10 MG/ML IV BOLUS
INTRAVENOUS | Status: DC | PRN
  Administered 2014-08-17: 150 mg via INTRAVENOUS

## 2014-08-17 MED ORDER — PROPOFOL 10 MG/ML IV BOLUS
INTRAVENOUS | Status: AC
  Filled 2014-08-17: qty 20

## 2014-08-17 MED ORDER — PHENYLEPHRINE HCL 10 MG/ML IJ SOLN
INTRAMUSCULAR | Status: DC | PRN
  Administered 2014-08-17: 80 ug via INTRAVENOUS

## 2014-08-17 MED ORDER — ONDANSETRON HCL 4 MG/2ML IJ SOLN: 4.0000 mg | Freq: Four times a day (QID) | INTRAMUSCULAR | Status: DC | PRN

## 2014-08-17 MED ORDER — LIDOCAINE HCL (CARDIAC) 20 MG/ML IV SOLN
INTRAVENOUS | Status: AC
  Filled 2014-08-17: qty 5

## 2014-08-17 MED ORDER — GLYCOPYRROLATE 0.2 MG/ML IJ SOLN
INTRAMUSCULAR | Status: AC
  Filled 2014-08-17: qty 3

## 2014-08-17 SURGICAL SUPPLY — 45 items
BENZOIN TINCTURE PRP APPL 2/3 (GAUZE/BANDAGES/DRESSINGS) IMPLANT
BLADE HEX COATED 2.75 (ELECTRODE) ×4 IMPLANT
BLADE SURG 15 STRL LF DISP TIS (BLADE) ×2 IMPLANT
BLADE SURG 15 STRL SS (BLADE) ×2
CANISTER SUCTION 2500CC (MISCELLANEOUS) IMPLANT
CHLORAPREP W/TINT 26ML (MISCELLANEOUS) ×4 IMPLANT
CLOSURE WOUND 1/2 X4 (GAUZE/BANDAGES/DRESSINGS)
DECANTER SPIKE VIAL GLASS SM (MISCELLANEOUS) IMPLANT
DERMABOND ADVANCED (GAUZE/BANDAGES/DRESSINGS) ×2
DERMABOND ADVANCED .7 DNX12 (GAUZE/BANDAGES/DRESSINGS) ×2 IMPLANT
DISSECTOR ROUND CHERRY 3/8 STR (MISCELLANEOUS) IMPLANT
DRAIN PENROSE 18X1/2 LTX STRL (DRAIN) IMPLANT
DRAPE LAPAROTOMY TRNSV 102X78 (DRAPE) ×4 IMPLANT
ELECT REM PT RETURN 9FT ADLT (ELECTROSURGICAL) ×4
ELECTRODE REM PT RTRN 9FT ADLT (ELECTROSURGICAL) ×2 IMPLANT
GAUZE SPONGE 4X4 12PLY STRL (GAUZE/BANDAGES/DRESSINGS) IMPLANT
GLOVE EUDERMIC 7 POWDERFREE (GLOVE) ×4 IMPLANT
GOWN STRL REUS W/TWL XL LVL3 (GOWN DISPOSABLE) ×12 IMPLANT
KIT BASIN OR (CUSTOM PROCEDURE TRAY) ×4 IMPLANT
MANIFOLD NEPTUNE II (INSTRUMENTS) ×4 IMPLANT
MESH ULTRAPRO 3X6 7.6X15CM (Mesh General) ×4 IMPLANT
NEEDLE HYPO 25X1 1.5 SAFETY (NEEDLE) ×4 IMPLANT
PACK BASIC VI WITH GOWN DISP (CUSTOM PROCEDURE TRAY) ×4 IMPLANT
PENCIL BUTTON HOLSTER BLD 10FT (ELECTRODE) ×4 IMPLANT
SPONGE LAP 4X18 X RAY DECT (DISPOSABLE) ×4 IMPLANT
STAPLER VISISTAT 35W (STAPLE) IMPLANT
STRIP CLOSURE SKIN 1/2X4 (GAUZE/BANDAGES/DRESSINGS) IMPLANT
SUT MNCRL AB 4-0 PS2 18 (SUTURE) ×4 IMPLANT
SUT PROLENE 2 0 CT2 30 (SUTURE) ×20 IMPLANT
SUT SILK 2 0 (SUTURE)
SUT SILK 2 0 SH (SUTURE) IMPLANT
SUT SILK 2-0 18XBRD TIE 12 (SUTURE) IMPLANT
SUT SURG 0 T 19/GS 22 1969 62 (SUTURE) ×4 IMPLANT
SUT VIC AB 2-0 SH 27 (SUTURE) ×4
SUT VIC AB 2-0 SH 27X BRD (SUTURE) ×4 IMPLANT
SUT VIC AB 3-0 SH 27 (SUTURE) ×2
SUT VIC AB 3-0 SH 27XBRD (SUTURE) ×2 IMPLANT
SUT VICRYL 2 0 18  UND BR (SUTURE) ×2
SUT VICRYL 2 0 18 UND BR (SUTURE) ×2 IMPLANT
SYR BULB IRRIGATION 50ML (SYRINGE) ×4 IMPLANT
SYRINGE 20CC LL (MISCELLANEOUS) ×4 IMPLANT
TOWEL OR 17X26 10 PK STRL BLUE (TOWEL DISPOSABLE) ×4 IMPLANT
TOWEL OR NON WOVEN STRL DISP B (DISPOSABLE) ×4 IMPLANT
TRAY FOLEY CATH 16FRSI W/METER (SET/KITS/TRAYS/PACK) IMPLANT
YANKAUER SUCT BULB TIP 10FT TU (MISCELLANEOUS) ×4 IMPLANT

## 2014-08-17 NOTE — Anesthesia Preprocedure Evaluation (Addendum)
Anesthesia Evaluation  Patient identified by MRN, date of birth, ID band Patient awake    Reviewed: Allergy & Precautions, H&P , NPO status , Patient's Chart, lab work & pertinent test results  Airway Mallampati: III TM Distance: >3 FB Neck ROM: Full    Dental  (+) Teeth Intact, Dental Advisory Given   Pulmonary COPDformer smoker,  breath sounds clear to auscultation        Cardiovascular METS: 3 - Mets hypertension, + Peripheral Vascular Disease + dysrhythmias Atrial Fibrillation Rhythm:Irregular Rate:Normal     Neuro/Psych CVA negative psych ROS   GI/Hepatic Neg liver ROS, GERD-  ,  Endo/Other  Hypothyroidism   Renal/GU negative Renal ROS  negative genitourinary   Musculoskeletal negative musculoskeletal ROS (+)   Abdominal   Peds  Hematology negative hematology ROS (+)   Anesthesia Other Findings   Reproductive/Obstetrics negative OB ROS                          Anesthesia Physical Anesthesia Plan  ASA: III  Anesthesia Plan: General   Post-op Pain Management:    Induction: Intravenous  Airway Management Planned: Oral ETT  Additional Equipment: None  Intra-op Plan:   Post-operative Plan: Extubation in OR  Informed Consent: I have reviewed the patients History and Physical, chart, labs and discussed the procedure including the risks, benefits and alternatives for the proposed anesthesia with the patient or authorized representative who has indicated his/her understanding and acceptance.     Plan Discussed with: CRNA and Surgeon  Anesthesia Plan Comments:        Anesthesia Quick Evaluation

## 2014-08-17 NOTE — Anesthesia Postprocedure Evaluation (Signed)
  Anesthesia Post-op Note  Patient: Kristine Simmons  Procedure(s) Performed: Procedure(s): OPEN REPAIR RIGHT FEMORAL HERNIA  WITH INSERTION OF MESH (Right) INSERTION OF MESH  Patient Location: PACU  Anesthesia Type:General  Level of Consciousness: awake, alert  and oriented  Airway and Oxygen Therapy: Patient Spontanous Breathing  Post-op Pain: mild  Post-op Assessment: Post-op Vital signs reviewed  Post-op Vital Signs: Reviewed  Last Vitals:  Filed Vitals:   08/17/14 1445  BP: 137/84  Pulse: 103  Temp: 36.5 C  Resp: 16    Complications: No apparent anesthesia complications

## 2014-08-17 NOTE — Progress Notes (Addendum)
[facility] Progress Note   Kristine Simmons DGU:[id number] DOB: 15-Mar-1927 DOA: 08/14/2014 PCP: Unice Cobble, MD  Brief narrative: Kristine Simmons is a 80 y.o. female with a history of atrial fibrillation, hypothyroidism and arthritis who was admitted to the hospital with a complaint of abdominal pain vomiting and diarrhea. She was found to have an acute small bowel obstruction due to right femoral incarcerated hernia which was managed conservatively and improved. At this point the surgical team has decided that she is a high-risk of recurrent obstruction and has decided to perform surgery during this hospital stay.  Subjective: Patient evaluated prior to surgery this morning-has no complaints  Assessment/Plan: Principal Problem:   Femoral hernia with obstruction -Per surgical service -Would recommend that she be transitioned over to the surgical team for continued care during the hospital stay  Active Problems:   Atrial fibrillation -Noted to be in sinus rhythm this morning with controlled heart rate -Holding Eliquis- surgirical team to advise on timing for resumption    Aortic stenosis, mild - stable     Code Status: Full code Family Communication: None Disposition Plan: Home DVT prophylaxis: SCDs  Consultants: Surgery  Procedures: None  Antibiotics: Anti-infectives   Start     Dose/Rate Route Frequency Ordered Stop   08/17/14 0600  ceFAZolin (ANCEF) IVPB 2 g/50 mL premix  Status:  Discontinued     2 g 100 mL/hr over 30 Minutes Intravenous On call to O.R. 08/16/14 1447 08/16/14 1455   08/17/14 0600  ceFAZolin (ANCEF) IVPB 2 g/50 mL premix     2 g 100 mL/hr over 30 Minutes Intravenous On call to O.R. 08/16/14 1456 08/17/14 1200         Objective: Filed Weights   08/15/14 0458  Weight: 59.2 kg (130 lb 8.2 oz)    Intake/Output Summary (Last 24 hours) at 08/17/14 1337 Last data filed at 08/17/14 1322  Gross per 24 hour  Intake   3040 ml  Output    4550 ml  Net  -1510 ml     Vitals Filed Vitals:   08/17/14 0915 08/17/14 1000 08/17/14 1323 08/17/14 1330  BP: 136/76 138/64 156/91 140/81  Pulse: 77 57 94 90  Temp:  97.7 F (36.5 C) 98.1 F (36.7 C)   TempSrc:  Oral    Resp:  16 17 19   Height:      Weight:      SpO2:  95% 98% 99%    Exam: General: No acute respiratory distress Lungs: Clear to auscultation bilaterally without wheezes or crackles Cardiovascular: Regular rate and rhythm without murmur gallop or rub normal S1 and S2 Abdomen: Nontender, nondistended, soft, bowel sounds positive, no rebound, no ascites, no appreciable mass Extremities: No significant cyanosis, clubbing, or edema bilateral lower extremities  Data Reviewed: Basic Metabolic Panel:  Recent Labs Lab 08/14/14 1912 08/15/14 0455  NA 136* 137  K 4.5 4.3  CL 97 102  CO2 27 25  GLUCOSE 98 83  BUN 13 10  CREATININE 0.86 0.83  CALCIUM 9.7 8.4   Liver Function Tests:  Recent Labs Lab 08/14/14 1912 08/15/14 0455  AST 34 25  ALT 23 17  ALKPHOS 46 38*  BILITOT 0.6 0.8  PROT 6.9 5.4*  ALBUMIN 4.0 3.1*    Recent Labs Lab 08/14/14 1912  LIPASE 29   No results found for this basename: AMMONIA,  in the last 168 hours CBC:  Recent Labs Lab 08/14/14 1912  WBC 6.5  NEUTROABS 3.7  HGB  15.4*  HCT 46.4*  MCV 87.1  PLT 185   Cardiac Enzymes: No results found for this basename: CKTOTAL, CKMB, CKMBINDEX, TROPONINI,  in the last 168 hours BNP (last 3 results) No results found for this basename: PROBNP,  in the last 8760 hours CBG: No results found for this basename: GLUCAP,  in the last 168 hours  Recent Results (from the past 240 hour(s))  SURGICAL PCR SCREEN     Status: None   Collection Time    08/17/14  4:30 AM      Result Value Ref Range Status   MRSA, PCR NEGATIVE  NEGATIVE Final   Staphylococcus aureus NEGATIVE  NEGATIVE Final   Comment:            The Xpert SA Assay (FDA     approved for NASAL specimens     in patients  over 53 years of age),     is one component of     a comprehensive surveillance     program.  Test performance has     been validated by [employer] for patients greater     than or equal to 9 year old.     It is not intended     to diagnose infection nor to     guide or monitor treatment.     Studies:  Recent x-ray studies have been reviewed in detail by the Attending Physician  Scheduled Meds:  Scheduled Meds: . chlorhexidine  1 application Topical Once  . [facility] HOLD] digoxin  0.125 mg Oral QHS  . [facility] HOLD] diltiazem  120 mg Oral Daily  . [MAR HOLD] latanoprost  1 drop Both Eyes QHS  . [facility] HOLD] levothyroxine  50 mcg Oral QAC breakfast  . [MAR HOLD] pantoprazole (PROTONIX) IV  40 mg Intravenous Q24H  . [facility] HOLD] timolol  1 drop Both Eyes BID   Continuous Infusions: . sodium chloride Stopped (08/17/14 1122)  . lactated ringers 1,000 mL (08/17/14 1124)    Time spent on care of this patient: 30 min   [place], MD 08/17/2014, 1:37 PM  LOS: 3 days   [facility] Office  [phone] Pager - Text Page per [email or web address]  If 7PM-7AM, please contact night-coverage [email or web address]

## 2014-08-17 NOTE — Transfer of Care (Signed)
Immediate Anesthesia Transfer of Care Note  Patient: Kristine Simmons  Procedure(s) Performed: Procedure(s) (LRB): OPEN REPAIR RIGHT FEMORAL HERNIA  WITH INSERTION OF MESH (Right) INSERTION OF MESH  Patient Location: PACU  Anesthesia Type: General  Level of Consciousness: sedated, patient cooperative and responds to stimulation  Airway & Oxygen Therapy: Patient Spontanous Breathing and Patient connected to face mask oxgen  Post-op Assessment: Report given to PACU RN and Post -op Vital signs reviewed and stable  Post vital signs: Reviewed and stable  Complications: No apparent anesthesia complications

## 2014-08-17 NOTE — Op Note (Addendum)
Patient Name:           Kristine Simmons   Date of Surgery:        08/17/2014  Note: This dictation was prepared with Dragon/digital dictation along with [facility] technology. Any transcriptional errors that result from this process are unintentional.   Pre op Diagnosis:      Incarcerated right femoral hernia with transient small bowel obstruction  Post op Diagnosis:    Same  Procedure:                 Cooper's ligament repair of right inguinal and right femoral hernia with UltraPro mesh  Surgeon:                     Edsel Petrin. Dalbert Batman, M.D., FACS  Assistant:                      none  Operative Indications:   This is a relatively healthy 80 year old female on Eliquis for atrial fibrillation, history of cerebrovascular accident with blindness left eye, remote history of tobacco use and COPD, and mild aortic valve disease.She presented 3 days ago with Abdominal pain and nausea. She had a similar episode  a few months ago. CT scan in the emergency room showed small bowel obstruction, most likely due to entrapment within a right femoral hernia. Review showed a loop of bowel extending down into the femoral canal immediately medial to the vessels. The hernia was reduced and her symptoms resolved She was admitted to the hospital.  She was able to resume diet and bowel function.  The hernia it would protrude with cough or Valsalva was easily reducible.She was advised to consider repair at this time to prevent future problems and she agreed to that.  Operative Findings:       It looked like she had a small indirect right inguinal hernia and also a right femoral hernia. Both of these appeared to be reduced at the time of surgery. A 3" x 6" piece of ultra Pro mesh was used and a Cooper's ligament repair technique was used.  Procedure in Detail:          Following the induction of general endotracheal anesthesia the patient's abdomen and groins and genitalia were prepped and draped in a sterile fashion.  Intravenous antibiotics were given. Surgical time out was performed. 0.5% Marcaine with epinephrine was used as local infiltration anesthetic in all tissue layers.     A transverse incision was made in the right groin overlying the right inguinal canal. Dissection was carried down through the subcutaneous tissues. The aponeurosis of the external oblique was identified and cleaned off. I identified the external inguinal ring. The external oblique was incised in the direction of its fibers opening up the external ring. The external oblique was dissected away from the underlying structures. I identified the inguinal ligament. I identified the round ligament and this was isolated at the level of pubic tubercle, clamped, divided and ligated with 2-0 Vicryl ties. I dissected the round ligament back to the internal ring. There was a small lipoma and I could see a small hernia sac that  was empty and simply reduced. There was a noticeable muscle defect at that location and I closed that with 2-0 Vicryl sutures. I then took down the medial floor of the inguinal canal exposing Cooper's ligament. There was no trapped bowel. The ultra Pro mesh was brought to the operating room. It was trimmed to  accommodate the anatomy of the wound. Medially I placed 4 sutures of 0 Surgilon down through the mesh, then through Cooper's ligament and then back up through the mesh. This took the repair all the way to the femoral vein laterally.   The most lateral Surgilon suture was passed through Cooper's ligament and then a transition stitch up to the inguinal ligament and then all 4 of these sutures were tied being sure that all the soft tissue was held out of the way. I then further secured the inferior edge of the mesh to the inguinal ligament all the way laterally. Medially, superiorly and superolaterally I placed  interrupted mattress sutures of 2-0 Prolene. This provided very secure coverage and repair of both femoral and inguinal  positions. The wound was irrigated. There was no bleeding. The external oblique was closed with a running suture of 2-0 Vicryl. Scarpa's fascia was closed with 3-0 Vicryl sutures and the skin closed with a running 4-0 Monocryl and Dermabond. The patient tolerated the procedure well was taken to PACU in stable condition. EBL 10 cc. Counts correct. Complications none.      Edsel Petrin. Dalbert Batman, M.D., FACS General and Minimally Invasive Surgery Breast and Colorectal Surgery  08/17/2014 1:10 PM

## 2014-08-17 NOTE — Progress Notes (Signed)
General Surgery:  Patient doing well. Asymptomatic from GI/hernia standpoint. Husband in room. Exam-abdomen soft. Hernia reduced.  I discussed the surgical plan, recovery issues and risks again. They are both comfortable with plan.  Edsel Petrin. Dalbert Batman, M.D., [facility] General and Minimally invasive Surgery Breast and Colorectal Surgery Office:   [phone] Pager:   [phone]

## 2014-08-17 NOTE — Progress Notes (Signed)
PACU note--pt's SAO2 90's, deep breathing and coughing encouraged; pt educated on use of Incentive spirometer

## 2014-08-18 ENCOUNTER — Encounter (HOSPITAL_COMMUNITY): Payer: Self-pay | Admitting: General Surgery

## 2014-08-18 MED ORDER — MORPHINE SULFATE 2 MG/ML IJ SOLN: 1.0000 mg | INTRAMUSCULAR | Status: DC | PRN

## 2014-08-18 MED ORDER — ACETAMINOPHEN 325 MG PO TABS: 650.0000 mg | ORAL_TABLET | Freq: Four times a day (QID) | ORAL | Status: DC | PRN

## 2014-08-18 MED ORDER — ENSURE COMPLETE PO LIQD
237.0000 mL | Freq: Every day | ORAL | Status: DC
  Administered 2014-08-19 – 2014-08-20 (×2): 237 mL via ORAL

## 2014-08-18 MED ORDER — POLYETHYLENE GLYCOL 3350 17 G PO PACK
17.0000 g | PACK | Freq: Two times a day (BID) | ORAL | Status: AC
  Administered 2014-08-18 (×2): 17 g via ORAL
  Filled 2014-08-18 (×2): qty 1

## 2014-08-18 MED ORDER — TRIMETHOPRIM 100 MG PO TABS
100.0000 mg | ORAL_TABLET | Freq: Every day | ORAL | Status: DC
  Administered 2014-08-18 – 2014-08-21 (×4): 100 mg via ORAL
  Filled 2014-08-18 (×4): qty 1

## 2014-08-18 NOTE — Progress Notes (Addendum)
I agree. Hopefully she will progress to the point where she can go home tomorrow.  Adin Hector

## 2014-08-18 NOTE — Progress Notes (Signed)
[facility] Progress Note   Kristine Simmons URK:[id number] DOB: 11/24/1927 DOA: 08/14/2014 PCP: Unice Cobble, MD  Brief narrative: Kristine Simmons is a 80 y.o. female with a history of atrial fibrillation, hypothyroidism and arthritis who was admitted to the hospital with a complaint of abdominal pain vomiting and diarrhea. She was found to have an acute small bowel obstruction due to right femoral incarcerated hernia which was managed conservatively and improved. At this point the surgical team has decided that she is a high-risk of recurrent obstruction and has decided to perform surgery during this hospital stay.  Subjective: Underwent surgery yesterday. Notes that she is having trouble initiating urination. No dysuria or suprapubic pain. She takes Trimethoprim QOD at home and this has not been resumed in the hospital as it was not mentioned on the med rec.   Assessment/Plan: Principal Problem:   Femoral hernia with obstruction -Per surgical service  Active Problems:   Atrial fibrillation -Noted to be in sinus rhythm this morning with controlled heart rate -Eliquis resumed by surgirical team  Urinary hesitancy - likely related to narcotics and surgery - agree with continuing IVF - have resumed Trimethoprim- currently I do not feels that her symptoms are related to a UTI    Aortic stenosis, mild - stable     Code Status: Full code Family Communication: None Disposition Plan: plan per surgical service DVT prophylaxis: SCDs  Consultants: Surgery  Procedures: None  Antibiotics: Anti-infectives   Start     Dose/Rate Route Frequency Ordered Stop   08/18/14 1000  trimethoprim (TRIMPEX) tablet 100 mg     100 mg Oral Daily 08/18/14 0725     08/17/14 0600  ceFAZolin (ANCEF) IVPB 2 g/50 mL premix  Status:  Discontinued     2 g 100 mL/hr over 30 Minutes Intravenous On call to O.R. 08/16/14 1447 08/16/14 1455   08/17/14 0600  ceFAZolin (ANCEF) IVPB 2 g/50 mL premix      2 g 100 mL/hr over 30 Minutes Intravenous On call to O.R. 08/16/14 1456 08/17/14 1200         Objective: Filed Weights   08/15/14 0458  Weight: 59.2 kg (130 lb 8.2 oz)    Intake/Output Summary (Last 24 hours) at 08/18/14 0814 Last data filed at 08/18/14 0600  Gross per 24 hour  Intake 2212.51 ml  Output    575 ml  Net 1637.51 ml     Vitals Filed Vitals:   08/17/14 2114 08/17/14 2222 08/18/14 0141 08/18/14 0508  BP:   120/61 101/60  Pulse:   88 80  Temp:   98.5 F (36.9 C) 98 F (36.7 C)  TempSrc:   Oral Oral  Resp:   16 16  Height:      Weight:      SpO2: 91% 96% 96% 95%    Exam: General: No acute respiratory distress Lungs: Clear to auscultation bilaterally without wheezes or crackles Cardiovascular: Regular rate and rhythm without murmur gallop or rub normal S1 and S2 Abdomen: Nontender, nondistended, soft, bowel sounds positive, no rebound, no ascites, no appreciable mass- incision appears clean Extremities: No significant cyanosis, clubbing, or edema bilateral lower extremities  Data Reviewed: Basic Metabolic Panel:  Recent Labs Lab 08/14/14 1912 08/15/14 0455  NA 136* 137  K 4.5 4.3  CL 97 102  CO2 27 25  GLUCOSE 98 83  BUN 13 10  CREATININE 0.86 0.83  CALCIUM 9.7 8.4   Liver Function Tests:  Recent Labs Lab 08/14/14 1912  08/15/14 0455  AST 34 25  ALT 23 17  ALKPHOS 46 38*  BILITOT 0.6 0.8  PROT 6.9 5.4*  ALBUMIN 4.0 3.1*    Recent Labs Lab 08/14/14 1912  LIPASE 29   No results found for this basename: AMMONIA,  in the last 168 hours CBC:  Recent Labs Lab 08/14/14 1912  WBC 6.5  NEUTROABS 3.7  HGB 15.4*  HCT 46.4*  MCV 87.1  PLT 185   Cardiac Enzymes: No results found for this basename: CKTOTAL, CKMB, CKMBINDEX, TROPONINI,  in the last 168 hours BNP (last 3 results) No results found for this basename: PROBNP,  in the last 8760 hours CBG: No results found for this basename: GLUCAP,  in the last 168 hours  Recent  Results (from the past 240 hour(s))  SURGICAL PCR SCREEN     Status: None   Collection Time    08/17/14  4:30 AM      Result Value Ref Range Status   MRSA, PCR NEGATIVE  NEGATIVE Final   Staphylococcus aureus NEGATIVE  NEGATIVE Final   Comment:            The Xpert SA Assay (FDA     approved for NASAL specimens     in patients over 51 years of age),     is one component of     a comprehensive surveillance     program.  Test performance has     been validated by [employer] for patients greater     than or equal to 76 year old.     It is not intended     to diagnose infection nor to     guide or monitor treatment.     Studies:  Recent x-ray studies have been reviewed in detail by the Attending Physician  Scheduled Meds:  Scheduled Meds: . apixaban  2.5 mg Oral BID  . digoxin  0.125 mg Oral QHS  . diltiazem  120 mg Oral Daily  . latanoprost  1 drop Both Eyes QHS  . levothyroxine  50 mcg Oral QAC breakfast  . pantoprazole (PROTONIX) IV  40 mg Intravenous Q24H  . polyethylene glycol  17 g Oral BID  . timolol  1 drop Both Eyes BID  . trimethoprim  100 mg Oral Daily   Continuous Infusions: . 0.9 % NaCl with KCl 20 mEq / L 50 mL/hr at 08/17/14 2114    Time spent on care of this patient: 30 min   [place], MD 08/18/2014, 8:14 AM  LOS: 4 days   [facility] Office  [phone] Pager - Text Page per [email or web address]  If 7PM-7AM, please contact night-coverage [email or web address]

## 2014-08-18 NOTE — Care Management Note (Addendum)
    Page 1 of 1   08/19/2014     10:57:04 AM CARE MANAGEMENT NOTE 08/19/2014  Patient:  Kristine Simmons, Kristine Simmons   Account Number:  [id number]  Date Initiated:  08/15/2014  Documentation initiated by:  Sunday Spillers  Subjective/Objective Assessment:   80 yo female admitted with bowel obstruction related to hernia. PTA lived at home with spouse.     Action/Plan:   Home when stable   Anticipated DC Date:  08/19/2014   Anticipated DC Plan:  [place]  CM consult      [facility] Choice  HOME HEALTH   Choice offered to / List presented to:  C-1 Patient        [place] arranged  [place]   Status of service:  Completed, signed off Medicare Important Message given?  YES (If response is "NO", the following Medicare IM given date fields will be blank) Date Medicare IM given:  08/18/2014 Medicare IM given by:  [facility] Date Additional Medicare IM given:   Additional Medicare IM given by:    Discharge Disposition:  HOME/SELF CARE  Per UR Regulation:  Reviewed for med. necessity/level of care/duration of stay  If discussed at [place] of Stay Meetings, dates discussed:    Comments:

## 2014-08-18 NOTE — Progress Notes (Signed)
1 Day Post-Op  Subjective: Stable and alert. No complaints. Pain seems to be reasonably tolerated. Tolerating liquid diet.No respiratory complaints. No nausea. Says she could eat.  Objective: Vital signs in last 24 hours: Temp:  [97.7 F (36.5 C)-98.5 F (36.9 C)] 98 F (36.7 C) (08/27 0508) Pulse Rate:  [57-103] 80 (08/27 0508) Resp:  [8-19] 16 (08/27 0508) BP: (101-156)/(56-91) 101/60 mmHg (08/27 0508) SpO2:  [83 %-99 %] 95 % (08/27 0508) Last BM Date: 08/14/14  Intake/Output from previous day: 08/26 0701 - 08/27 0700 In: 2212.5 [I.V.:2042.5; IV Piggyback:50] Out: 975 [Urine:975] Intake/Output this shift: Total I/O In: 438.3 [I.V.:438.3] Out: 275 [Urine:275]  General appearance: alert.  Cooperative. Mental status seems normal. She forgot that she had surgery yesterday. Resp: clear to auscultation bilaterally GI: abdomen is soft and nontender. Nondistended. Right groin incision looks good. No hematoma or bleeding. Hernia repair intact.  Lab Results:  No results found for this or any previous visit (from the past 24 hour(s)).   Studies/Results: No results found.  Marland Kitchen apixaban  2.5 mg Oral BID  . digoxin  0.125 mg Oral QHS  . diltiazem  120 mg Oral Daily  . latanoprost  1 drop Both Eyes QHS  . levothyroxine  50 mcg Oral QAC breakfast  . pantoprazole (PROTONIX) IV  40 mg Intravenous Q24H  . polyethylene glycol  17 g Oral BID  . timolol  1 drop Both Eyes BID     Assessment/Plan: s/p Procedure(s): OPEN REPAIR RIGHT FEMORAL HERNIA  WITH INSERTION OF MESH INSERTION OF MESH  POD #1. Open Cooper's ligament repair of incarcerated right femoral hernia and right inguinal hernia. Stable  Ambulate in halls. PT consult to assess  Advance diet MiraLAX for constipation Possible discharge this afternoon  Atrial fibrillation and history CVA with blindness left eye. Restart Eliquis today  Remote history of tobacco use and COPD. Asymptomatic  @PROBHOSP @  LOS: 4 days     Reigan Tolliver M 08/18/2014  . .prob

## 2014-08-18 NOTE — Progress Notes (Signed)
Pt has only walked once and said BP went down with getting up.  She has only eaten one meal, and still fairly anxious.  I don't think she is ready to go home yet and she does not feel ready.  Hold discharge, try to mobilize more and check orthostatic.

## 2014-08-18 NOTE — Progress Notes (Signed)
NUTRITION FOLLOW UP  Intervention:   - Ensure Complete daily - Encouraged pt to eat all her meals - RD to continue to monitor   Nutrition Dx:   Inadequate oral intake related to inability to eat as evidenced by NPO - no longer appropriate, diet advanced   New nutrition dx: Increased nutrient needs related to hernia repair as evidenced by MD notes   Goal:   Advance diet as tolerated to regular diet - met  New goal: Pt to consume >90% of meals/supplements   Monitor:   Weights, labs, intake  Assessment:   Pt with past medical history significant for atrial fibrillation, osteoarthritis and hypothyroidism presenting today with a new onset abdominal pain of one-day duration he nausea vomiting or diarrhea. No fever or chills . CT of the abdomen showed small bowel obstruction within a right inguinal hernia without evidence of ischemia.  8/24:  - Met with pt and husband  - Pt reports eating 3 meals/day at home of healthy diet  - Has honey nut cheerios with fruit for breakfast, a sandwich with fruit for lunch, and a meat/vegetables/starch for dinner  - Used to drink 1 Ensure/day but hasn't done that recently  - Michela Pitcher she has usual body weight of 121 pounds, currently 130 pounds  - During nutrition focused physical exam, pt said that she has no sensation in her feet and said this has been going on for a long time  - Pt denies any abdominal pain today  8/27: - S/p Cooper's ligament repair of right inguinal and right femoral hernia with UltraPro mesh 8/26 - Diet advanced to regular this morning  - RN was assisting pt with ordering breakfast - Pt reports she did well with clear and full liquid diet yesterday, had some jello and chicken noodle soup last night which she tolerated well without nausea   Height: Ht Readings from Last 1 Encounters:  08/15/14 5' 7"  (1.702 m)    Weight Status:   Wt Readings from Last 1 Encounters:  08/15/14 130 lb 8.2 oz (59.2 kg)    Re-estimated needs:   Kcal: 1500-1700  Protein: 60-70g  Fluid: 1.5-1.7L/day   Skin: intact    Diet Order: General   Intake/Output Summary (Last 24 hours) at 08/18/14 0856 Last data filed at 08/18/14 0600  Gross per 24 hour  Intake 2212.51 ml  Output    575 ml  Net 1637.51 ml    Last BM: 8/23   Labs:   Recent Labs Lab 08/14/14 1912 08/15/14 0455  NA 136* 137  K 4.5 4.3  CL 97 102  CO2 27 25  BUN 13 10  CREATININE 0.86 0.83  CALCIUM 9.7 8.4  GLUCOSE 98 83    CBG (last 3)  No results found for this basename: GLUCAP,  in the last 72 hours  Scheduled Meds: . apixaban  2.5 mg Oral BID  . digoxin  0.125 mg Oral QHS  . diltiazem  120 mg Oral Daily  . latanoprost  1 drop Both Eyes QHS  . levothyroxine  50 mcg Oral QAC breakfast  . pantoprazole (PROTONIX) IV  40 mg Intravenous Q24H  . polyethylene glycol  17 g Oral BID  . timolol  1 drop Both Eyes BID  . trimethoprim  100 mg Oral Daily    Continuous Infusions: . 0.9 % NaCl with KCl 20 mEq / L 50 mL/hr at 08/17/14 2114     Carlis Stable MS, [place], [place] Pager [phone] Weekend/After Hours Pager

## 2014-08-18 NOTE — Evaluation (Signed)
Physical Therapy Evaluation Patient Details Name: Kristine Simmons MRN: [id number] DOB: [birth year] Today's Date: 08/18/2014   History of Present Illness  Incarcerated right femoral hernia with transient small bowel traction, s/p repair on 08/17/14  Clinical Impression  Patient's  Sats dropped during mobility on RA to 84%, back to 94  On 2 L. RN aware. Patient will benefit from PT to address problems listed in note.Patient has unsteady gait, peripheral neuropathy and impaired vision.    Follow Up Recommendations Home health PT;Supervision/Assistance - 24 hour    Equipment Recommendations  None recommended by PT    Recommendations for Other Services OT consult     Precautions / Restrictions Precautions Precautions: Fall Precaution Comments: falls at home,  blind L eye, glaucoma R,, peripheral neuropathy      Mobility  Bed Mobility Overal bed mobility: Needs Assistance             General bed mobility comments: sitting  up in bed to  edge of bed with  extra time, explained to patient that if she was  supine she should roll to side then to  sitting  Transfers Overall transfer level: Needs assistance Equipment used: 1 person hand held assist Transfers: Sit to/from Stand Sit to Stand: Supervision         General transfer comment: from bed and toilet, extra time  Ambulation/Gait Ambulation/Gait assistance: Min assist Ambulation Distance (Feet): 175 Feet Assistive device: 1 person hand held assist Gait Pattern/deviations: Step-to pattern;Step-through pattern;Staggering right;Staggering left   Gait velocity interpretation: <1.8 ft/sec, indicative of risk for recurrent falls General Gait Details: held onto IV pole and HHA  Stairs            Wheelchair Mobility    Modified Rankin (Stroke Patients Only)       Balance Overall balance assessment: Needs assistance;History of Falls Sitting-balance support: No upper extremity supported;Feet supported Sitting  balance-Leahy Scale: Good     Standing balance support: No upper extremity supported;During functional activity Standing balance-Leahy Scale: Poor                               Pertinent Vitals/Pain Pain Assessment: 0-10 Pain Score: 5  Pain Descriptors / Indicators: Cramping    Home Living Family/patient expects to be discharged to:: Private residence Living Arrangements: Spouse/significant other Available Help at Discharge: Family Type of Home: House Home Access: Stairs to enter   Technical brewer of Steps: 1 x2 Home Layout: One level Home Equipment: Cane - single point      Prior Function Level of Independence: Needs assistance   Gait / Transfers Assistance Needed: "cruises" in home on objects  ADL's / Homemaking Assistance Needed: spouse assists fue to vision        Hand Dominance        Extremity/Trunk Assessment   Upper Extremity Assessment: Overall WFL for tasks assessed           Lower Extremity Assessment: LLE deficits/detail;RLE deficits/detail         Communication      Cognition Arousal/Alertness: Awake/alert Behavior During Therapy: WFL for tasks assessed/performed Overall Cognitive Status: Within Functional Limits for tasks assessed                      General Comments      Exercises        Assessment/Plan    PT Assessment Patient needs continued PT services  PT  Diagnosis Difficulty walking;Abnormality of gait;Acute pain   PT Problem List Decreased activity tolerance;Decreased balance;Decreased mobility;Decreased knowledge of use of DME;Decreased safety awareness;Decreased knowledge of precautions;Impaired sensation;Cardiopulmonary status limiting activity  PT Treatment Interventions DME instruction;Gait training;Functional mobility training;Therapeutic activities;Therapeutic exercise;Patient/family education;Balance training   PT Goals (Current goals can be found in the Care Plan section) Acute Rehab PT  Goals Patient Stated Goal: To go home PT Goal Formulation: With patient/family Time For Goal Achievement: 09/01/14 Potential to Achieve Goals: Good    Frequency Min 3X/week   Barriers to discharge        Co-evaluation               End of Session Equipment Utilized During Treatment: Gait belt Activity Tolerance: Patient limited by pain Patient left: in chair;with call bell/phone within reach;with family/visitor present Nurse Communication: Mobility status (sats dropped)    Functional Assessment Tool Used: clinical  judgement Functional Limitation: Mobility: Walking and moving around Mobility: Walking and Moving Around Current Status [phone]): At least 20 percent but less than 40 percent impaired, limited or restricted Mobility: Walking and Moving Around Goal Status [phone]): 0 percent impaired, limited or restricted Mobility: Walking and Moving Around Discharge Status [phone]): At least 1 percent but less than 20 percent impaired, limited or restricted    Time: 7371-0626 PT Time Calculation (min): 41 min   Charges:   PT Evaluation $Initial PT Evaluation Tier I: 1 Procedure PT Treatments $Gait Training: 23-37 mins $Self Care/Home Management: 8-22   PT G Codes:   Functional Assessment Tool Used: clinical  judgement Functional Limitation: Mobility: Walking and moving around    [place], Shella Maxim 08/18/2014, 1:25 PM Tresa Endo PT [phone]

## 2014-08-19 ENCOUNTER — Inpatient Hospital Stay (HOSPITAL_COMMUNITY): Payer: Medicare Other

## 2014-08-19 MED ORDER — POLYETHYLENE GLYCOL 3350 17 G PO PACK
17.0000 g | PACK | Freq: Two times a day (BID) | ORAL | Status: DC
  Administered 2014-08-19 – 2014-08-21 (×3): 17 g via ORAL
  Filled 2014-08-19 (×7): qty 1

## 2014-08-19 MED ORDER — BISACODYL 10 MG RE SUPP
10.0000 mg | Freq: Once | RECTAL | Status: AC
  Administered 2014-08-19: 10 mg via RECTAL
  Filled 2014-08-19: qty 1

## 2014-08-19 MED ORDER — FUROSEMIDE 10 MG/ML IJ SOLN
40.0000 mg | Freq: Once | INTRAMUSCULAR | Status: AC
  Administered 2014-08-19: 40 mg via INTRAVENOUS
  Filled 2014-08-19: qty 4

## 2014-08-19 MED ORDER — ALBUTEROL SULFATE HFA 108 (90 BASE) MCG/ACT IN AERS: 2.0000 | INHALATION_SPRAY | Freq: Four times a day (QID) | RESPIRATORY_TRACT | Status: DC | PRN

## 2014-08-19 MED ORDER — ACETAMINOPHEN 325 MG PO TABS: ORAL_TABLET | ORAL | Status: DC

## 2014-08-19 MED ORDER — OXYCODONE-ACETAMINOPHEN 5-325 MG PO TABS: 1.0000 | ORAL_TABLET | ORAL | Status: DC | PRN

## 2014-08-19 NOTE — Progress Notes (Signed)
Patient complains of feeling bloated and is more distended compared to this am.

## 2014-08-19 NOTE — Discharge Summary (Signed)
Physician Discharge Summary  Patient ID: Kristine Simmons MRN: [id number] DOB/AGE: [birth year] 78 y.o.  Admit date: 08/14/2014 Discharge date: 8/30  /2015  Admission Diagnoses:  1. SBO with right femoral hernia  2. Atrial fibrillation on Eliquis (last dose 6 PM 08/15/14)  3. Hx of CVA, with blindness left eye  4. Hx of tobacco use/COPD/POST OP oxygen desaturation. 5. Mild Aortic and mitral stenosis with normal EF on Echo 06/06/14  6. Hyperlipidemia  7. Hypothyroid  8. Limited mobility/balance and vision issues  9. S/p Right hip, left shoulder surgeries, with right knee limitations.  10. Chronic constipation issues  11. Recurrent UTI's she is on antibiotics QOD for this, it sounds like Septra, but she isn't sure at this point.  Discharge Diagnoses:  1. SBO with right femoral hernia  2. Atrial fibrillation on Eliquis (last dose 6 PM 08/15/14)  3. Hx of CVA, with blindness left eye  4. Hx of tobacco use/COPD/POST OP oxygen desaturation. 5. Mild Aortic and mitral stenosis with normal EF on Echo 06/06/14  6. Hyperlipidemia  7. Hypothyroid  8. Limited mobility/balance and vision issues  9. S/p Right hip, left shoulder surgeries, with right knee limitations.  10. Chronic constipation issues  11. Recurrent UTI's she is on antibiotics QOD for this, it sounds like Septra, but she isn't sure at this point.   Principal Problem:   Femoral hernia with obstruction Active Problems:   COPD   Atrial fibrillation   Aortic stenosis, mild   Chronic anticoagulation   History of CVA (cerebrovascular accident)   SBO (small bowel obstruction)   PROCEDURES: Cooper's ligament repair of right inguinal and right femoral hernia with UltraPro mesh, Adin Hector, MD, 08/17/2014.     Hospital Course: Pt admitted by Dr. Laren Everts on 08/14/14, with abdominal pain.   We saw in consult on 08/15/14. Pt reports onset of diffuse abdominal pain starting at noon yesterday, pain was sharpe and intermittent. No  vomiting or diarrhea, +BM AM 2 days ago. She had pain all afternoon. Sometimes it would be worse than others, but never resolved completely. She did get some relief lying on her abdomen with a pillow under it. She says she fell asleep with this for a time yesterday. She had no flatus or BM yesterday. She has had some flatus this AM. She say she had a similar episode back in April. That lasted about 7 hours, and then resolved. She was diagnosed with diverticulosis and constipation at that time. She has been on MOM TABLETS for years and most recently went from 1 daily to 2 daily. She feels better now and has had some flatus. She has no pain on the current exam. She remains afebrile, labs are unremarkable. CT scan on admission , shows: 1. Small bowel obstruction, most likely due to entrapment within a right inguinal hernia without evidence of ischemia. 2. Minimal free peritoneal fluid. 3. COPD.  The hernia was found on admission and reduced without difficulty. She did have tenderness at a specific point yesterday where the hernia was reduced. She was feeling better, her hernia was reduced in the ED, and she had bowel sounds.  She was seen by Cardiology, and cleared for surgery.  Her Eliquis for chronic anticoagulation was held. Her diet was advanced.  She was taken to the OR on 08/17/14 for repair of her right femoral hernia.  Post op she has done well from a surgical standpoint.  Her diet has been advanced and she is up to a regular  diet, but did have some constipation post op.  She had some orthostatic hypotension that resolved.  Her biggest issue has been mobility and O2 desaturation without O2 in place.  She does have some history of tobacco use and COPD on CT scan.  Dr. Cruzita Lederer is addressing this with some lasix.  Case management is helping with home health needs.  PT did order PT at home, supervision/assistance at home also. She failed to come off O2 and she was sent home on O2.  She will follow up with primary  care after discharge.  Condition on d/c:  improved  Disposition: 01-Home or Self Care     Medication List    STOP taking these medications       amoxicillin 500 MG capsule  Commonly known as:  AMOXIL      TAKE these medications       acetaminophen 325 MG tablet  Commonly known as:  TYLENOL  You can take 2 tablets every 4 hours as needed.  You cannot take more than 4000 mg of tylenol per day.  This is also in your sleep aide, and it is in your prescribed pain medicine. So you need to figure out.  Do not take more than 12 tablets of tylenol (acetaminophen) per day.     albuterol 108 (90 BASE) MCG/ACT inhaler  Commonly known as:  PROVENTIL HFA;VENTOLIN HFA  Inhale 2 puffs into the lungs every 6 (six) hours as needed for wheezing or shortness of breath.     bimatoprost 0.03 % ophthalmic solution  Commonly known as:  LUMIGAN  Place 1 drop into both eyes at bedtime.     calcium-vitamin D 500-200 MG-UNIT per tablet  Take 1 tablet by mouth daily.     CRANBERRY CONCENTRATE 500 MG Caps  Generic drug:  Cranberry  Take 2 capsules by mouth daily.     digoxin 0.125 MG tablet  Commonly known as:  LANOXIN  Take 0.125 mg by mouth at bedtime.     diltiazem 120 MG 24 hr capsule  Commonly known as:  CARDIZEM CD  Take 120 mg by mouth daily.     diphenhydramine-acetaminophen 25-500 MG Tabs  Commonly known as:  TYLENOL PM  as needed. Infrequently     ELIQUIS 2.5 MG Tabs tablet  Generic drug:  apixaban  Take 2.5 mg by mouth 2 (two) times daily.     ENSURE PLUS Liqd  Take 1 Can by mouth daily.     estradiol 0.1 MG/GM vaginal cream  Commonly known as:  ESTRACE  Place 2 g vaginally. Twice weekly     ezetimibe-simvastatin 10-40 MG per tablet  Commonly known as:  VYTORIN  Take 1 tablet by mouth at bedtime.     levothyroxine 50 MCG tablet  Commonly known as:  SYNTHROID, LEVOTHROID  Take 50 mcg by mouth daily before breakfast.     LORazepam 1 MG tablet  Commonly known as:   ATIVAN  Take 0.5 mg by mouth every 8 (eight) hours as needed for anxiety or sleep.     Magnesium Oxide 500 MG Tabs  Take 1 tablet by mouth at bedtime.     Misc Intestinal Flora Regulat Caps  Take 1 capsule by mouth daily.     multivitamin tablet  Take 1 tablet by mouth daily. Super Multivit     oxyCODONE-acetaminophen 5-325 MG per tablet  Commonly known as:  PERCOCET/ROXICET  Take 1-2 tablets by mouth every 4 (four) hours as needed for moderate pain.  timolol 0.5 % ophthalmic solution  Commonly known as:  TIMOPTIC  Place 1 drop into both eyes 2 (two) times daily.     trimethoprim 100 MG tablet  Commonly known as:  TRIMPEX  Take 100 mg by mouth every other day.     Vitamin B Complex Tabs  Take 1 tablet by mouth daily.     Vitamin D 2000 UNITS tablet  Take 2,000 Units by mouth daily.       Follow-up Information   Follow up with Adin Hector, MD. Schedule an appointment as soon as possible for a visit in 2 weeks.   Specialty:  General Surgery   Contact information:   [address] [place] [address] [phone]       Signed: Earnstine Regal 08/19/2014, 2:41 PM

## 2014-08-19 NOTE — Progress Notes (Addendum)
2 Days Post-Op  Subjective: Patient is alert he she states she feels better. She has been ambulating in the halls. Denies shortness of breath. SpO2 93% on 2 L nasal cannula.Denies cough, sputum production, chest pain. Tolerating diet. Passing flatus. No stool.  According to physical therapy sats dropped to 84% during mobility and came back to 94% immediately on 2 L O2. Asymptomatic.   Objective: Vital signs in last 24 hours: Temp:  [98 F (36.7 C)-98.3 F (36.8 C)] 98 F (36.7 C) (08/27 2120) Pulse Rate:  [67] 67 (08/27 2120) Resp:  [16] 16 (08/27 2120) BP: (99-100)/(55-59) 99/59 mmHg (08/27 2120) SpO2:  [91 %-94 %] 93 % (08/27 2120) Last BM Date: 08/14/14  Intake/Output from previous day: 08/27 0701 - 08/28 0700 In: 1000 [P.O.:600; I.V.:400] Out: 750 [Urine:750] Intake/Output this shift: Total I/O In: -  Out: 300 [Urine:300]  General appearance: alert. Mental status normal. Oriented. No distress. Resp: clear to auscultation bilaterally GI: abdomen soft. Nontender. Bowel sounds present. Right groin incision looks good. No hematoma or swelling.  Lab Results:  No results found for this or any previous visit (from the past 24 hour(s)).   Studies/Results: No results found.  Marland Kitchen apixaban  2.5 mg Oral BID  . digoxin  0.125 mg Oral QHS  . diltiazem  120 mg Oral Daily  . feeding supplement (ENSURE COMPLETE)  237 mL Oral Q1400  . latanoprost  1 drop Both Eyes QHS  . levothyroxine  50 mcg Oral QAC breakfast  . pantoprazole (PROTONIX) IV  40 mg Intravenous Q24H  . timolol  1 drop Both Eyes BID  . trimethoprim  100 mg Oral Daily     Assessment/Plan: s/p Procedure(s): OPEN REPAIR RIGHT FEMORAL HERNIA  WITH INSERTION OF MESH INSERTION OF MESH  POD #2. Open Cooper's ligament repair of incarcerated right femoral hernia and right inguinal hernia.  Stable  Ambulate in halls.  Appreciate PT. Advance diet  MiraLAX for constipation  Possible discharge today with home health PT  and 24-hour assistance.  Atrial fibrillation and history CVA with blindness left eye.  Restarted Eliquis yesterday   Remote history of tobacco use and COPD. Asymptomatic. This is probably contributing to her mild desaturation.She may be essentially back to baseline. Followed by Unice Cobble as outpatient    @PROBHOSP @  LOS: 5 days    Oryn Casanova M 08/19/2014  . .prob

## 2014-08-19 NOTE — Progress Notes (Addendum)
[facility] Consult Note   Kristine Simmons KGM:[id number] DOB: 01/07/1927 DOA: 08/14/2014 PCP: Unice Cobble, MD  Brief narrative: Kristine Simmons is a 80 y.o. female with a history of atrial fibrillation, hypothyroidism and arthritis who was admitted to the hospital with a complaint of abdominal pain vomiting and diarrhea. She was found to have an acute small bowel obstruction due to right femoral incarcerated hernia which was managed conservatively and improved. At this point the surgical team has decided that she is a high-risk of recurrent obstruction and has decided to perform surgery during this hospital stay.  Subjective: - feeling well today, denies breathing difficulties and feels at baseline despite recorded low O2 sats.  Assessment/Plan: COPD with oxygen requirements - patient likely carries this diagnosis. She has a 40 pack year history of smoking and clinically she has decreased breath sounds on exam. She is completely asymptomatic even when her O2 sats are in the mid 80s which suggests a chronic process. Her O2 sat as an outpatient at rest were documented at low as 91% in 2013. Never checked with ambulation.  - for completeness will obtain a CXR portable and if clear she should be OK for discharge today per primary team with oxygen at home - discussed with patient and her daughter to touch base with Dr. Linna Darner once she recovers from her surgery perhaps in 2-3 weeks to have a referral for outpatient PFT testing to better quantify her COPD - I will give her a prescription for albuterol as needed.  Addendum: CXR with mild edema. Lasix 40 mg x 1, d/c IVF to [facility], should be ready for d/c in am.   Femoral hernia with obstruction -Per surgical service Atrial fibrillation -Noted to be in sinus rhythm with controlled heart rate -Eliquis resumed by surgirical team Urinary hesitancy - likely related to narcotics and surgery - have resumed Trimethoprim- currently I do not feels  that her symptoms are related to a UTI Aortic stenosis, mild - stable  Code Status: Full code Family Communication: None Disposition Plan: plan per surgical service DVT prophylaxis: SCDs  Antibiotics: Anti-infectives   Start     Dose/Rate Route Frequency Ordered Stop   08/18/14 1000  trimethoprim (TRIMPEX) tablet 100 mg     100 mg Oral Daily 08/18/14 0725     08/17/14 0600  ceFAZolin (ANCEF) IVPB 2 g/50 mL premix  Status:  Discontinued     2 g 100 mL/hr over 30 Minutes Intravenous On call to O.R. 08/16/14 1447 08/16/14 1455   08/17/14 0600  ceFAZolin (ANCEF) IVPB 2 g/50 mL premix     2 g 100 mL/hr over 30 Minutes Intravenous On call to O.R. 08/16/14 1456 08/17/14 1200         Objective: Filed Weights   08/15/14 0458  Weight: 59.2 kg (130 lb 8.2 oz)    Intake/Output Summary (Last 24 hours) at 08/19/14 1247 Last data filed at 08/19/14 1023  Gross per 24 hour  Intake 1745.83 ml  Output   1550 ml  Net 195.83 ml     Vitals Filed Vitals:   08/18/14 1000 08/18/14 1442 08/18/14 2120 08/19/14 0628  BP: 100/55  99/59 128/69  Pulse: 67  67 73  Temp: 98.3 F (36.8 C)  98 F (36.7 C) 97.4 F (36.3 C)  TempSrc: Oral  Oral Oral  Resp: 16  16 18   Height:      Weight:      SpO2: 91% 94% 93% 94%    Exam:  General: No acute respiratory distress Lungs: somewhat diminished bilaterally, Clear without wheezes or crackles Cardiovascular: Regular rate and rhythm, 3/6 SEM Abdomen: Nontender, nondistended, soft, bowel sounds positive, no rebound, no ascites, no appreciable mass- incision appears clean Extremities: No significant cyanosis, clubbing, or edema bilateral lower extremities  Data Reviewed: Basic Metabolic Panel:  Recent Labs Lab 08/14/14 1912 08/15/14 0455  NA 136* 137  K 4.5 4.3  CL 97 102  CO2 27 25  GLUCOSE 98 83  BUN 13 10  CREATININE 0.86 0.83  CALCIUM 9.7 8.4   Liver Function Tests:  Recent Labs Lab 08/14/14 1912 08/15/14 0455  AST 34 25  ALT  23 17  ALKPHOS 46 38*  BILITOT 0.6 0.8  PROT 6.9 5.4*  ALBUMIN 4.0 3.1*    Recent Labs Lab 08/14/14 1912  LIPASE 29   CBC:  Recent Labs Lab 08/14/14 1912  WBC 6.5  NEUTROABS 3.7  HGB 15.4*  HCT 46.4*  MCV 87.1  PLT 185    Recent Results (from the past 240 hour(s))  SURGICAL PCR SCREEN     Status: None   Collection Time    08/17/14  4:30 AM      Result Value Ref Range Status   MRSA, PCR NEGATIVE  NEGATIVE Final   Staphylococcus aureus NEGATIVE  NEGATIVE Final   Comment:            The Xpert SA Assay (FDA     approved for NASAL specimens     in patients over 10 years of age),     is one component of     a comprehensive surveillance     program.  Test performance has     been validated by [employer] for patients greater     than or equal to 2 year old.     It is not intended     to diagnose infection nor to     guide or monitor treatment.     Scheduled Meds:  Scheduled Meds: . apixaban  2.5 mg Oral BID  . digoxin  0.125 mg Oral QHS  . diltiazem  120 mg Oral Daily  . feeding supplement (ENSURE COMPLETE)  237 mL Oral Q1400  . latanoprost  1 drop Both Eyes QHS  . levothyroxine  50 mcg Oral QAC breakfast  . pantoprazole (PROTONIX) IV  40 mg Intravenous Q24H  . polyethylene glycol  17 g Oral BID  . timolol  1 drop Both Eyes BID  . trimethoprim  100 mg Oral Daily   Continuous Infusions: . 0.9 % NaCl with KCl 20 mEq / L 50 mL/hr at 08/19/14 1016    Time spent on care of this patient: 25 min Marzetta Board, MD 08/19/2014, 12:47 PM  LOS: 5 days  [facility] Office  [phone] Pager - Text Page per [email or web address]

## 2014-08-19 NOTE — Progress Notes (Signed)
SATURATION QUALIFICATIONS: (This note is used to comply with regulatory documentation for home oxygen)  Patient Saturations on Room Air at Rest = 82%  Patient Saturations on Room Air while Ambulating = 80%  Patient Saturations on 3 Liters of oxygen while Ambulating = 84-88%  Please briefly explain why patient needs home oxygen: Patient unable to maintain sats above 82% or Room air.

## 2014-08-19 NOTE — Discharge Instructions (Signed)
[facility] _______[facility] ° °UMBILICAL OR INGUINAL HERNIA REPAIR: POST OP INSTRUCTIONS ° °Always review your discharge instruction sheet given to you by the facility where your surgery was performed. °IF YOU HAVE DISABILITY OR FAMILY LEAVE FORMS, YOU MUST BRING THEM TO THE OFFICE FOR PROCESSING.   °DO NOT GIVE THEM TO YOUR DOCTOR. ° °1. A  prescription for pain medication may be given to you upon discharge.  Take your pain medication as prescribed, if needed.  If narcotic pain medicine is not needed, then you may take acetaminophen (Tylenol) or ibuprofen (Advil) as needed. °2. Take your usually prescribed medications unless otherwise directed. °3. If you need a refill on your pain medication, please contact your pharmacy.  They will contact our office to request authorization. Prescriptions will not be filled after 5 pm or on week-ends. °4. You should follow a light diet the first 24 hours after arrival home, such as soup and crackers, etc.  Be sure to include lots of fluids daily.  Resume your normal diet the day after surgery. °5. Most patients will experience some swelling and bruising around the umbilicus or in the groin and scrotum.  Ice packs and reclining will help.  Swelling and bruising can take several days to resolve.  °6. It is common to experience some constipation if taking pain medication after surgery.  Increasing fluid intake and taking a stool softener (such as Colace) will usually help or prevent this problem from occurring.  A mild laxative (Milk of Magnesia or Miralax) should be taken according to package directions if there are no bowel movements after 48 hours. °7. Unless discharge instructions indicate otherwise, you may remove your bandages 24-48 hours after surgery, and you may shower at that time.  You may have steri-strips (small skin tapes) in place directly over the incision.  These strips should be left on the skin for 7-10 days.  If your surgeon used skin glue on the  incision, you may shower in 24 hours.  The glue will flake off over the next 2-3 weeks.  Any sutures or staples will be removed at the office during your follow-up visit. °8. ACTIVITIES:  You may resume regular (light) daily activities beginning the next day--such as daily self-care, walking, climbing stairs--gradually increasing activities as tolerated.  You may have sexual intercourse when it is comfortable.  Refrain from any heavy lifting or straining until approved by your doctor. °a. You may drive when you are no longer taking prescription pain medication, you can comfortably wear a seatbelt, and you can safely maneuver your car and apply brakes. °b. RETURN TO WORK:  __________________________________________________________ °9. You should see your doctor in the office for a follow-up appointment approximately 2-3 weeks after your surgery.  Make sure that you call for this appointment within a day or two after you arrive home to insure a convenient appointment time. °10. OTHER INSTRUCTIONS:  __________________________________________________________________________________________________________________________________________________________________________________________  °WHEN TO CALL YOUR DOCTOR: °1. Fever over 101.0 °2. Inability to urinate °3. Nausea and/or vomiting °4. Extreme swelling or bruising °5. Continued bleeding from incision. °6. Increased pain, redness, or drainage from the incision ° °The clinic staff is available to answer your questions during regular business hours.  Please don’t hesitate to call and ask to speak to one of the nurses for clinical concerns.  If you have a medical emergency, go to the nearest emergency room or call 911.  A surgeon from [facility] is always on call at the hospital ° ° °  [address] [place], [place], [place]  [address] ?  [address], [place], [place]   [address] [phone] ? [phone] ? FAX [phone] Web site:  [email or web address] Inguinal Hernia, Adult Muscles help keep everything in the body in its proper place. But if a weak spot in the muscles develops, something can poke through. That is called a hernia. When this happens in the lower part of the belly (abdomen), it is called an inguinal hernia. (It takes its name from a part of the body in this region called the inguinal canal.) A weak spot in the wall of muscles lets some fat or part of the small intestine bulge through. An inguinal hernia can develop at any age. Men get them more often than women. CAUSES  In adults, an inguinal hernia develops over time.  It can be triggered by:  Suddenly straining the muscles of the lower abdomen.  Lifting heavy objects.  Straining to have a bowel movement. Difficult bowel movements (constipation) can lead to this.  Constant coughing. This may be caused by smoking or lung disease.  Being overweight.  Being pregnant.  Working at a job that requires long periods of standing or heavy lifting.  Having had an inguinal hernia before. One type can be an emergency situation. It is called a strangulated inguinal hernia. It develops if part of the small intestine slips through the weak spot and cannot get back into the abdomen. The blood supply can be cut off. If that happens, part of the intestine may die. This situation requires emergency surgery. SYMPTOMS  Often, a small inguinal hernia has no symptoms. It is found when a healthcare provider does a physical exam. Larger hernias usually have symptoms.   In adults, symptoms may include:  A lump in the groin. This is easier to see when the person is standing. It might disappear when lying down.  In men, a lump in the scrotum.  Pain or burning in the groin. This occurs especially when lifting, straining or coughing.  A dull ache or feeling of pressure in the groin.  Signs of a strangulated hernia can include:  A bulge in the groin that  becomes very painful and tender to the touch.  A bulge that turns red or purple.  Fever, nausea and vomiting.  Inability to have a bowel movement or to pass gas. DIAGNOSIS  To decide if you have an inguinal hernia, a healthcare provider will probably do a physical examination.  This will include asking questions about any symptoms you have noticed.  The healthcare provider might feel the groin area and ask you to cough. If an inguinal hernia is felt, the healthcare provider may try to slide it back into the abdomen.  Usually no other tests are needed. TREATMENT  Treatments can vary. The size of the hernia makes a difference. Options include:  Watchful waiting. This is often suggested if the hernia is small and you have had no symptoms.  No medical procedure will be done unless symptoms develop.  You will need to watch closely for symptoms. If any occur, contact your healthcare provider right away.  Surgery. This is used if the hernia is larger or you have symptoms.  Open surgery. This is usually an outpatient procedure (you will not stay overnight in a hospital). An cut (incision) is made through the skin in the groin. The hernia is put back inside the abdomen. The weak area in the muscles is then repaired by herniorrhaphy or  hernioplasty. Herniorrhaphy: in this type of surgery, the weak muscles are sewn back together. Hernioplasty: a patch or mesh is used to close the weak area in the abdominal wall.  Laparoscopy. In this procedure, a surgeon makes small incisions. A thin tube with a tiny video camera (called a laparoscope) is put into the abdomen. The surgeon repairs the hernia with mesh by looking with the video camera and using two long instruments. HOME CARE INSTRUCTIONS   After surgery to repair an inguinal hernia:  You will need to take pain medicine prescribed by your healthcare provider. Follow all directions carefully.  You will need to take care of the wound from the  incision.  Your activity will be restricted for awhile. This will probably include no heavy lifting for several weeks. You also should not do anything too active for a few weeks. When you can return to work will depend on the type of job that you have.  During "watchful waiting" periods, you should:  Maintain a healthy weight.  Eat a diet high in fiber (fruits, vegetables and whole grains).  Drink plenty of fluids to avoid constipation. This means drinking enough water and other liquids to keep your urine clear or pale yellow.  Do not lift heavy objects.  Do not stand for long periods of time.  Quit smoking. This should keep you from developing a frequent cough. SEEK MEDICAL CARE IF:   A bulge develops in your groin area.  You feel pain, a burning sensation or pressure in the groin. This might be worse if you are lifting or straining.  You develop a fever of more than 100.5 F (38.1 C). SEEK IMMEDIATE MEDICAL CARE IF:   Pain in the groin increases suddenly.  A bulge in the groin gets bigger suddenly and does not go down.  For men, there is sudden pain in the scrotum. Or, the size of the scrotum increases.  A bulge in the groin area becomes red or purple and is painful to touch.  You have nausea or vomiting that does not go away.  You feel your heart beating much faster than normal.  You cannot have a bowel movement or pass gas.  You develop a fever of more than 102.0 F (38.9 C). Document Released: 04/27/2009 Document Revised: 03/02/2012 Document Reviewed: 04/27/2009 [facility] Patient Information 2015 [place], [place]. This information is not intended to replace advice given to you by your health care provider. Make sure you discuss any questions you have with your health care provider.

## 2014-08-20 LAB — GLUCOSE, CAPILLARY: GLUCOSE-CAPILLARY: 110 mg/dL — AB (ref 70–99)

## 2014-08-20 NOTE — Progress Notes (Signed)
3 Days Post-Op  Subjective: Pt waling the halls.  Needs o2 to keep sats greater than 92 %.  Not SOB or having any chest pain.  Wants to go home.   Objective: Vital signs in last 24 hours: Temp:  [98 F (36.7 C)-98.4 F (36.9 C)] 98.4 F (36.9 C) (08/29 2426) Pulse Rate:  [66-69] 69 (08/29 0638) Resp:  [18] 18 (08/29 8341) BP: (110-112)/(59-61) 110/59 mmHg (08/29 0638) SpO2:  [93 %-94 %] 94 % (08/29 9622) Last BM Date: 08/20/14  Intake/Output from previous day: 08/28 0701 - 08/29 0700 In: 479.2 [P.O.:360; I.V.:119.2] Out: 2050 [Urine:2050] Intake/Output this shift:    General appearance: alert and cooperative seen in hallway with husband.   Lab Results:  No results found for this basename: WBC, HGB, HCT, PLT,  in the last 72 hours BMET No results found for this basename: NA, K, CL, CO2, GLUCOSE, BUN, CREATININE, CALCIUM,  in the last 72 hours PT/INR No results found for this basename: LABPROT, INR,  in the last 72 hours ABG No results found for this basename: PHART, PCO2, PO2, HCO3,  in the last 72 hours  Studies/Results: Dg Chest Port 1 View  08/19/2014   CLINICAL DATA:  80 year old female with shortness of breath and cough  EXAM: PORTABLE CHEST - 1 VIEW  COMPARISON:  08/15/2014  FINDINGS: Cardiomegaly and COPD/emphysema changes noted.  Increased interstitial opacities likely represent interstitial edema.  Small bilateral pleural effusions, left-greater-than-right, are noted with bibasilar atelectasis.  There is no evidence of pneumothorax.  Left shoulder hemiarthroplasty identified.  IMPRESSION: Cardiomegaly with new interstitial opacities likely representing pulmonary edema although interstitial infection is not excluded.  Small bilateral pleural effusions, left greater than right, with bibasilar atelectasis.  COPD/emphysema.   Electronically Signed   By: Hassan Rowan M.D.   On: 08/19/2014 13:58    Anti-infectives: Anti-infectives   Start     Dose/Rate Route Frequency Ordered  Stop   08/18/14 1000  trimethoprim (TRIMPEX) tablet 100 mg     100 mg Oral Daily 08/18/14 0725     08/17/14 0600  ceFAZolin (ANCEF) IVPB 2 g/50 mL premix  Status:  Discontinued     2 g 100 mL/hr over 30 Minutes Intravenous On call to O.R. 08/16/14 1447 08/16/14 1455   08/17/14 0600  ceFAZolin (ANCEF) IVPB 2 g/50 mL premix     2 g 100 mL/hr over 30 Minutes Intravenous On call to O.R. 08/16/14 1456 08/17/14 1200      Assessment/Plan: s/p Procedure(s): OPEN REPAIR RIGHT FEMORAL HERNIA  WITH INSERTION OF MESH (Right) INSERTION OF MESH work on pulmonary toilet and wean oxygen.  continue to ambulate.  Home sunday if she can get off oxygen.  May try gentile diuresis.   LOS: 6 days    Gleason Ardoin A. 08/20/2014

## 2014-08-20 NOTE — Progress Notes (Addendum)
[facility] Consult Note   ROTUNDA WORDEN AST:[id number] DOB: March 29, 1927 DOA: 08/14/2014 PCP: Unice Cobble, Kristine Simmons  Brief narrative: Kristine Simmons is a 80 y.o. female with a history of atrial fibrillation, hypothyroidism and arthritis who was admitted to the hospital with a complaint of abdominal pain vomiting and diarrhea. She was found to have an acute small bowel obstruction due to right femoral incarcerated hernia which was managed conservatively and improved. At this point the surgical team has decided that she is a high-risk of recurrent obstruction and has decided to perform surgery during this hospital stay.  Subjective: - diuresed well overnight, she was on room air this morning when I saw her, asymptomatic  Assessment/Plan: COPD - post lasix last night due to mild pulmonary edema, diuresed well, on room air and asymptomatic this morning. I asked the nurse to walk patient with and without oxygen to documents O2 needs and then she would be ok go home  - discussed with patient and her daughter to touch base with Dr. Linna Darner once she recovers from her surgery perhaps in 2-3 weeks to have a referral for outpatient PFT testing to better quantify her COPD - I gave her a prescription for albuterol as needed.  Femoral hernia with obstruction -Per surgical service Atrial fibrillation -Noted to be in sinus rhythm with controlled heart rate -Eliquis resumed by surgirical team Urinary hesitancy - likely related to narcotics and surgery - have resumed Trimethoprim- currently I do not feels that her symptoms are related to a UTI Aortic stenosis, mild - stable  Code Status: Full code Family Communication: None Disposition Plan: plan per surgical service DVT prophylaxis: SCDs  Antibiotics: Anti-infectives   Start     Dose/Rate Route Frequency Ordered Stop   08/18/14 1000  trimethoprim (TRIMPEX) tablet 100 mg     100 mg Oral Daily 08/18/14 0725     08/17/14 0600  ceFAZolin  (ANCEF) IVPB 2 g/50 mL premix  Status:  Discontinued     2 g 100 mL/hr over 30 Minutes Intravenous On call to O.R. 08/16/14 1447 08/16/14 1455   08/17/14 0600  ceFAZolin (ANCEF) IVPB 2 g/50 mL premix     2 g 100 mL/hr over 30 Minutes Intravenous On call to O.R. 08/16/14 1456 08/17/14 1200         Objective: Filed Weights   08/15/14 0458  Weight: 59.2 kg (130 lb 8.2 oz)    Intake/Output Summary (Last 24 hours) at 08/20/14 0817 Last data filed at 08/20/14 0600  Gross per 24 hour  Intake 479.17 ml  Output   2050 ml  Net -1570.83 ml     Vitals Filed Vitals:   08/18/14 2120 08/19/14 0628 08/19/14 2100 08/20/14 0638  BP: 99/59 128/69 112/61 110/59  Pulse: 67 73 66 69  Temp: 98 F (36.7 C) 97.4 F (36.3 C) 98 F (36.7 C) 98.4 F (36.9 C)  TempSrc: Oral Oral Oral Oral  Resp: 16 18 18 18   Height:      Weight:      SpO2: 93% 94% 93% 94%    Exam: General: No acute respiratory distress Lungs: somewhat diminished bilaterally, Clear without wheezes or crackles Cardiovascular: Regular rate and rhythm, 3/6 SEM Abdomen: Nontender, nondistended, soft, bowel sounds positive, no rebound, no ascites, no appreciable mass- incision appears clean Extremities: No significant cyanosis, clubbing, or edema bilateral lower extremities  Data Reviewed: Basic Metabolic Panel:  Recent Labs Lab 08/14/14 1912 08/15/14 0455  NA 136* 137  K 4.5 4.3  CL 97 102  CO2 27 25  GLUCOSE 98 83  BUN 13 10  CREATININE 0.86 0.83  CALCIUM 9.7 8.4   Liver Function Tests:  Recent Labs Lab 08/14/14 1912 08/15/14 0455  AST 34 25  ALT 23 17  ALKPHOS 46 38*  BILITOT 0.6 0.8  PROT 6.9 5.4*  ALBUMIN 4.0 3.1*    Recent Labs Lab 08/14/14 1912  LIPASE 29   CBC:  Recent Labs Lab 08/14/14 1912  WBC 6.5  NEUTROABS 3.7  HGB 15.4*  HCT 46.4*  MCV 87.1  PLT 185    Recent Results (from the past 240 hour(s))  SURGICAL PCR SCREEN     Status: None   Collection Time    08/17/14  4:30 AM       Result Value Ref Range Status   MRSA, PCR NEGATIVE  NEGATIVE Final   Staphylococcus aureus NEGATIVE  NEGATIVE Final   Comment:            The Xpert SA Assay (FDA     approved for NASAL specimens     in patients over 37 years of age),     is one component of     a comprehensive surveillance     program.  Test performance has     been validated by [employer] for patients greater     than or equal to 70 year old.     It is not intended     to diagnose infection nor to     guide or monitor treatment.     Scheduled Meds:  Scheduled Meds: . apixaban  2.5 mg Oral BID  . digoxin  0.125 mg Oral QHS  . diltiazem  120 mg Oral Daily  . feeding supplement (ENSURE COMPLETE)  237 mL Oral Q1400  . latanoprost  1 drop Both Eyes QHS  . levothyroxine  50 mcg Oral QAC breakfast  . pantoprazole (PROTONIX) IV  40 mg Intravenous Q24H  . polyethylene glycol  17 g Oral BID  . timolol  1 drop Both Eyes BID  . trimethoprim  100 mg Oral Daily   Continuous Infusions: . 0.9 % NaCl with KCl 20 mEq / L 10 mL/hr at 08/19/14 1456    Time spent on care of this patient: 15 min Kristine Board, Kristine Simmons 08/20/2014, 8:17 AM  LOS: 6 days  [facility] Office  [phone] Pager - Text Page per [email or web address]

## 2014-08-21 MED ORDER — FUROSEMIDE 10 MG/ML IJ SOLN
40.0000 mg | Freq: Once | INTRAMUSCULAR | Status: DC
  Filled 2014-08-21 (×2): qty 4

## 2014-08-21 NOTE — Progress Notes (Signed)
[facility] Consult Note   Kristine Simmons CZY:[id number] DOB: 1927/05/22 DOA: 08/14/2014 PCP: Unice Cobble, MD  Brief narrative: Kristine Simmons is a 80 y.o. female with a history of atrial fibrillation, hypothyroidism and arthritis who was admitted to the hospital with a complaint of abdominal pain vomiting and diarrhea. She was found to have an acute small bowel obstruction due to right femoral incarcerated hernia which was managed conservatively and improved. At this point the surgical team has decided that she is a high-risk of recurrent obstruction and has decided to perform surgery during this hospital stay.  Subjective: - diuresed well overnight, she was on room air this morning when I saw her, asymptomatic  Assessment/Plan: COPD - post lasix last night due to mild pulmonary edema, diuresed well, she is asymptomatic, maintaining O2 sats > 90% on 2L, needs home O2, net negative 4.2 L - needs outpatient PFTs, will FW Dr. Linna Darner - I gave her a prescription for albuterol as needed.  Femoral hernia with obstruction -Per surgical service Atrial fibrillation -Noted to be in sinus rhythm with controlled heart rate -Eliquis resumed by surgirical team Urinary hesitancy - likely related to narcotics and surgery - have resumed Trimethoprim- currently I do not feels that her symptoms are related to a UTI Aortic stenosis, mild - stable  Code Status: Full code Family Communication: None Disposition Plan: plan per surgical service DVT prophylaxis: SCDs  Antibiotics: Anti-infectives   Start     Dose/Rate Route Frequency Ordered Stop   08/18/14 1000  trimethoprim (TRIMPEX) tablet 100 mg     100 mg Oral Daily 08/18/14 0725     08/17/14 0600  ceFAZolin (ANCEF) IVPB 2 g/50 mL premix  Status:  Discontinued     2 g 100 mL/hr over 30 Minutes Intravenous On call to O.R. 08/16/14 1447 08/16/14 1455   08/17/14 0600  ceFAZolin (ANCEF) IVPB 2 g/50 mL premix     2 g 100 mL/hr over 30  Minutes Intravenous On call to O.R. 08/16/14 1456 08/17/14 1200         Objective: Filed Weights   08/15/14 0458  Weight: 59.2 kg (130 lb 8.2 oz)    Intake/Output Summary (Last 24 hours) at 08/21/14 0826 Last data filed at 08/21/14 0556  Gross per 24 hour  Intake    600 ml  Output    800 ml  Net   -200 ml     Vitals Filed Vitals:   08/20/14 2216 08/20/14 2245 08/21/14 0550 08/21/14 0629  BP: 115/58  109/69   Pulse: 74  69   Temp: 97.5 F (36.4 C)  97.7 F (36.5 C)   TempSrc: Oral  Oral   Resp: 16  16   Height:      Weight:      SpO2: 91% 82% 90% 80%    Exam: General: No acute respiratory distress Lungs: somewhat diminished bilaterally, Clear without wheezes or crackles Cardiovascular: Regular rate and rhythm, 3/6 SEM Abdomen: Nontender, nondistended, soft, bowel sounds positive, no rebound, no ascites, no appreciable mass- incision appears clean Extremities: No significant cyanosis, clubbing, or edema bilateral lower extremities  Data Reviewed: Basic Metabolic Panel:  Recent Labs Lab 08/14/14 1912 08/15/14 0455  NA 136* 137  K 4.5 4.3  CL 97 102  CO2 27 25  GLUCOSE 98 83  BUN 13 10  CREATININE 0.86 0.83  CALCIUM 9.7 8.4   Liver Function Tests:  Recent Labs Lab 08/14/14 1912 08/15/14 0455  AST 34 25  ALT 23 17  ALKPHOS 46 38*  BILITOT 0.6 0.8  PROT 6.9 5.4*  ALBUMIN 4.0 3.1*    Recent Labs Lab 08/14/14 1912  LIPASE 29   CBC:  Recent Labs Lab 08/14/14 1912  WBC 6.5  NEUTROABS 3.7  HGB 15.4*  HCT 46.4*  MCV 87.1  PLT 185    Recent Results (from the past 240 hour(s))  SURGICAL PCR SCREEN     Status: None   Collection Time    08/17/14  4:30 AM      Result Value Ref Range Status   MRSA, PCR NEGATIVE  NEGATIVE Final   Staphylococcus aureus NEGATIVE  NEGATIVE Final   Comment:            The Xpert SA Assay (FDA     approved for NASAL specimens     in patients over 1 years of age),     is one component of     a  comprehensive surveillance     program.  Test performance has     been validated by [employer] for patients greater     than or equal to 15 year old.     It is not intended     to diagnose infection nor to     guide or monitor treatment.     Scheduled Meds:  Scheduled Meds: . apixaban  2.5 mg Oral BID  . digoxin  0.125 mg Oral QHS  . diltiazem  120 mg Oral Daily  . feeding supplement (ENSURE COMPLETE)  237 mL Oral Q1400  . latanoprost  1 drop Both Eyes QHS  . levothyroxine  50 mcg Oral QAC breakfast  . pantoprazole (PROTONIX) IV  40 mg Intravenous Q24H  . polyethylene glycol  17 g Oral BID  . timolol  1 drop Both Eyes BID  . trimethoprim  100 mg Oral Daily   Continuous Infusions: . 0.9 % NaCl with KCl 20 mEq / L 10 mL/hr at 08/19/14 1456    Time spent on care of this patient: 15 min Marzetta Board, MD 08/21/2014, 8:26 AM  LOS: 7 days  [facility] Office  [phone] Pager - Text Page per [email or web address]

## 2014-08-21 NOTE — Progress Notes (Signed)
SATURATION QUALIFICATIONS: (This note is used to comply with regulatory documentation for home oxygen)  Patient Saturations on Room Air at Rest = 90%  Patient Saturations on Room Air while Ambulating = 81%  Patient Saturations on 4 Liters of oxygen while Ambulating = 94%  Please briefly explain why patient needs home oxygen:

## 2014-08-21 NOTE — Progress Notes (Signed)
CARE MANAGEMENT NOTE 08/21/2014  Patient:  Kristine Simmons, Kristine Simmons   Account Number:  [id number]  Date Initiated:  08/15/2014  Documentation initiated by:  Sunday Spillers  Subjective/Objective Assessment:   80 yo female admitted with bowel obstruction related to hernia. PTA lived at home with spouse.     Action/Plan:   Home when stable   Anticipated DC Date:  08/19/2014   Anticipated DC Plan:  [place]  CM consult      [facility] Choice  HOME HEALTH   Choice offered to / List presented to:  C-1 Patient   DME arranged  [facility]      DME agency  [place] arranged  HH-2 PT  HH-1 RN      [place]   Status of service:  Completed, signed off Medicare Important Message given?  YES (If response is "NO", the following Medicare IM given date fields will be blank) Date Medicare IM given:  08/18/2014 Medicare IM given by:  [facility] Date Additional Medicare IM given:  08/21/2014 Additional Medicare IM given by:  Jonnie Finner  Discharge Disposition:  [place]  Per UR Regulation:  Reviewed for med. necessity/level of care/duration of stay  If discussed at [place] of Stay Meetings, dates discussed:    Comments:  08/21/2014 1040 NCM spoke to pt and offered choice for [facility]. Pt active with [facility]. Ordered Rollator and oxygen with [facility]. Notified [facility] of dc home today with HH. Jonnie Finner RN CCM Case Mgmt phone [phone]

## 2014-08-21 NOTE — Progress Notes (Signed)
Pt was given verbal and written discharge intructions and received new written prescriptions.  Pt has possession of all personal belongings.  Pt verbalized that she understood her discharge instructions.  Pt was accompanied by daughter and husband at time of discharge and was transported via wheelchair to the main entrance.  Izola Price , RN 08/21/2014

## 2014-08-21 NOTE — Progress Notes (Signed)
This shift pt desatted to 82 as she ambulated with NT on ra thru length of  Hallway x1. Will continue to monitor and intervene appropriately.

## 2014-08-21 NOTE — Progress Notes (Signed)
4 Days Post-Op  Subjective: Pt doing well this AM.  Ambulated in hall w/o O2 and sats dropped to 80%.  Pt sitting in bed on 6L O2 currently.  Objective: Vital signs in last 24 hours: Temp:  [97.5 F (36.4 C)-98 F (36.7 C)] 97.7 F (36.5 C) (08/30 0550) Pulse Rate:  [69-74] 69 (08/30 0550) Resp:  [16] 16 (08/30 0550) BP: (109-115)/(53-69) 109/69 mmHg (08/30 0550) SpO2:  [80 %-96 %] 80 % (08/30 0629) Last BM Date: 08/20/14  Intake/Output from previous day: 08/29 0701 - 08/30 0700 In: 600 [P.O.:480; I.V.:120] Out: 800 [Urine:800] Intake/Output this shift:    General appearance: alert and cooperative GI: soft, non-tender; bowel sounds normal; no masses,  no organomegaly Pulm: CTAB  Studies/Results: Dg Chest Port 1 View  08/19/2014   CLINICAL DATA:  80 year old female with shortness of breath and cough  EXAM: PORTABLE CHEST - 1 VIEW  COMPARISON:  08/15/2014  FINDINGS: Cardiomegaly and COPD/emphysema changes noted.  Increased interstitial opacities likely represent interstitial edema.  Small bilateral pleural effusions, left-greater-than-right, are noted with bibasilar atelectasis.  There is no evidence of pneumothorax.  Left shoulder hemiarthroplasty identified.  IMPRESSION: Cardiomegaly with new interstitial opacities likely representing pulmonary edema although interstitial infection is not excluded.  Small bilateral pleural effusions, left greater than right, with bibasilar atelectasis.  COPD/emphysema.   Electronically Signed   By: Hassan Rowan M.D.   On: 08/19/2014 13:58    Anti-infectives: Anti-infectives   Start     Dose/Rate Route Frequency Ordered Stop   08/18/14 1000  trimethoprim (TRIMPEX) tablet 100 mg     100 mg Oral Daily 08/18/14 0725     08/17/14 0600  ceFAZolin (ANCEF) IVPB 2 g/50 mL premix  Status:  Discontinued     2 g 100 mL/hr over 30 Minutes Intravenous On call to O.R. 08/16/14 1447 08/16/14 1455   08/17/14 0600  ceFAZolin (ANCEF) IVPB 2 g/50 mL premix     2  g 100 mL/hr over 30 Minutes Intravenous On call to O.R. 08/16/14 1456 08/17/14 1200      Assessment/Plan: s/p Procedure(s): OPEN REPAIR RIGHT FEMORAL HERNIA  WITH INSERTION OF MESH (Right) INSERTION OF MESH Pt con't with desats off O2 and while ambulating.  Will try and wean O2 while in bed and encourage IS.   If can tolerated being off O2 based on O2 sats, can DC home later today   LOS: 7 days    Rosario Jacks., Anne Hahn 08/21/2014

## 2014-08-21 NOTE — Progress Notes (Signed)
Notified [facility] for [facility] for scheduled dc home today with HH. Spoke to [facility] rep this am and will deliver portable oxygen and Rollator to room prior to dc. Jonnie Finner RN CCM Case Mgmt phone [phone]

## 2014-08-23 ENCOUNTER — Ambulatory Visit (INDEPENDENT_AMBULATORY_CARE_PROVIDER_SITE_OTHER): Payer: Medicare Other | Admitting: Internal Medicine

## 2014-08-23 ENCOUNTER — Encounter: Payer: Self-pay | Admitting: Internal Medicine

## 2014-08-23 VITALS — BP 108/68 | HR 68 | Temp 97.8°F | Wt 127.5 lb

## 2014-08-23 DIAGNOSIS — R609 Edema, unspecified: Secondary | ICD-10-CM

## 2014-08-23 DIAGNOSIS — E039 Hypothyroidism, unspecified: Secondary | ICD-10-CM

## 2014-08-23 DIAGNOSIS — E8809 Other disorders of plasma-protein metabolism, not elsewhere classified: Secondary | ICD-10-CM

## 2014-08-23 DIAGNOSIS — J438 Other emphysema: Secondary | ICD-10-CM

## 2014-08-23 DIAGNOSIS — J439 Emphysema, unspecified: Secondary | ICD-10-CM

## 2014-08-23 NOTE — Progress Notes (Signed)
Pre visit review using our clinic review tool, if applicable. No additional management support is needed unless otherwise documented below in the visit note. 

## 2014-08-23 NOTE — Patient Instructions (Addendum)
Total protein & albumin reflect nutrition; low fat  or plant protein (Ex soy) sources from [facility]  are recommended as supplements.Dose should not exceed per package label.Boost or Ensure also option.   Large medical studies SUGGEST multi vitamins do not decrease risk of heart disease or cancers . These can be discontinued  New Thyroid dose should be 1 daily EXCEPT 1&1/2 on Weds.

## 2014-08-23 NOTE — Progress Notes (Signed)
   Subjective:    Patient ID: Kristine Simmons, female    DOB: [birth year], 80 y.o.   MRN: [id number]  HPI She was hospitalized 8/23-8/28/15 following small bowel obstruction which required surgical correction 8/26. Hospitalization was complicated by minimal free peritoneal fluid; hypertension, & constipation.  She was found to have hypoxemia and oxygen was prescribed. She is an ex-smoker having quit 20 years ago.  She has been taking Percocet; last dose was yesterday.  Physical therapy is to be arranged at home.  Hospital records revealed GFR 63; albumin 3.1; total protein 5.4; and TSH 5.36.  She denies other active GI symptoms or signs of infection postoperatively.    Review of Systems Unexplained weight loss, abdominal pain, significant dyspepsia, dysphagia, melena, rectal bleeding, or persistently small caliber stools are denied.     Objective:   Physical Exam   Positive or pertinent physical findings include facial asymmetry related to previous cerebrovascular accident. There is decreased right nasolabial fold Ptosis is present on the left. The right lobe of the thyroid is absent. The operative site is well healed with no sign of cellulitis or purulence.  General appearance :adequately nourished; in no distress. Eyes: No conjunctival inflammation or scleral icterus is present. Oral exam: Dental hygiene is good. Lips and gums are healthy appearing.There is no oropharyngeal erythema or exudate noted.  Heart:  Normal rate and regular rhythm. S1 and S2 normal without gallop, murmur, click, rub or other extra sounds   Lungs:Chest clear to auscultation; no wheezes, rhonchi,rales ,or rubs present.No increased work of breathing.  Abdomen: bowel sounds normal, soft and non-tender without masses, organomegaly or hernias noted.  No guarding or rebound.  Skin:Warm & dry.  Intact without suspicious lesions or rashes ; no jaundice . Slight tenting Lymphatic: No lymphadenopathy is noted about  the head, neck, axilla         Assessment & Plan:  #1 femoral hernia, S/P repair. SBOresolved #2 COPD withhypoxemia #3 edema #4 low albumin #4 hypothyroidism See orders

## 2014-08-23 NOTE — Discharge Summary (Signed)
I agree with the discharge summary and followup plans as outlined.  Edsel Petrin. Dalbert Batman, M.D., [facility] General and Minimally invasive Surgery Breast and Colorectal Surgery Office:   [phone] Pager:   [phone]

## 2014-09-21 ENCOUNTER — Telehealth: Payer: Self-pay | Admitting: Internal Medicine

## 2014-09-21 NOTE — Telephone Encounter (Signed)
Monitor & make F/U appointment if progresses

## 2014-09-21 NOTE — Telephone Encounter (Signed)
Patient's home health physical therapist called to let us know the patient has expressed that she is feeling depressed. She has more strength now and could "get out more" if she wanted to, but she doesn't have the desire. She wanted to let you know in case the patient did not mention this at her office visit tomorrow. Arville Go [phone]

## 2014-09-22 ENCOUNTER — Ambulatory Visit (INDEPENDENT_AMBULATORY_CARE_PROVIDER_SITE_OTHER): Payer: Medicare Other | Admitting: Internal Medicine

## 2014-09-22 ENCOUNTER — Encounter: Payer: Self-pay | Admitting: Internal Medicine

## 2014-09-22 VITALS — BP 100/60 | HR 70 | Temp 98.0°F | Ht 68.0 in | Wt 122.5 lb

## 2014-09-22 DIAGNOSIS — E038 Other specified hypothyroidism: Secondary | ICD-10-CM

## 2014-09-22 DIAGNOSIS — F329 Major depressive disorder, single episode, unspecified: Secondary | ICD-10-CM

## 2014-09-22 DIAGNOSIS — R0902 Hypoxemia: Secondary | ICD-10-CM

## 2014-09-22 DIAGNOSIS — F32A Depression, unspecified: Secondary | ICD-10-CM

## 2014-09-22 HISTORY — DX: Depression, unspecified: F32.A

## 2014-09-22 MED ORDER — SERTRALINE HCL 50 MG PO TABS: 50.0000 mg | ORAL_TABLET | Freq: Every day | ORAL | Status: DC

## 2014-09-22 NOTE — Progress Notes (Signed)
   Subjective:    Patient ID: Kristine Simmons, female    DOB: November 14, 1927, 80 y.o.   MRN: [id number]  HPI She has multiple concerns.  She has been engaged in home health physical therapy since 08/26/14 following hospitalization  8/23-30 (record reviewed) for hernia repair. She was to be on 2 L continuous flow oxygen at home via nasal cannula.  As of 9/11 she stated she no longer wished to use oxygen during the day. As of 9/24 she is not using oxygen at night as well.  She walked with a therapist 2700 feet outdoors including an incline without oxygen desaturation below 94% her gait speed was 3 ft./sec. She's been checking her O2 sats upon awakening ; it varies 94-99%.  The hospital labs were reviewed. Her albumin was 2.1 and total protein 5.4. She's been taking a soy protein supplement & Ensure Plus qod  At that time her TSH was 5.36. Her thyroid supplement was increased to one daily except 1 &1/2 on Wednesdays    Review of Systems  Her major symptoms are anorexia, anhedonia," being down in the dumps". Her HHN called [place] with concerns about depression.  Chest pain, palpitations, tachycardia, exertional dyspnea, paroxysmal nocturnal dyspnea, or claudication  are absent. Some minor edema.  No fever ,chills , sweats, or unexplained significant weight loss. There is some peri prandial cough with occasional clear phlegm.She had 2 swallowing studies.       Objective:   Physical Exam   General appearance : thin but adequately nourished; in no distress. Eyes: No conjunctival inflammation or scleral icterus is present.Ptosis OS. Arcus  Ears: decreased hearing ; aids not worn Oral exam: Dental hygiene is good. Lips and gums are healthy appearing.There is no oropharyngeal erythema or exudate noted.  Neck: R thyroid small Heart:  Irregular rhythm. Grade 1 systolic murmur without gallop, click, rub or other extra sounds  Lungs:Chest clear to auscultation; no wheezes, rhonchi,rales ,or rubs present.No  increased work of breathing.  Abdomen: bowel sounds normal, soft and non-tender without masses, organomegaly or hernias noted.  No guarding or rebound. No flank tenderness to percussion. Vascular : all pulses equal but decreased pedal pulses ; no bruits present. Trace- 1/2 + edema Skin:Warm & dry.  Intact without suspicious lesions or rashes ; no jaundice or tenting Lymphatic: No lymphadenopathy is noted about the head, neck, axilla MS: isolated DIP OA changes Neuro: decreased R nasolabial fold. Unstable , broad gait. Using cane.           Assessment & Plan:  #1 postop debilitation, improving.  #2 slight edema in context of low albumin and total protein  #3 hypothyroidism, repeat TSH due late this month  #4 depression, situational  Plan: See orders and recommendations

## 2014-09-22 NOTE — Assessment & Plan Note (Signed)
Sertraline

## 2014-09-22 NOTE — Progress Notes (Signed)
Pre visit review using our clinic review tool, if applicable. No additional management support is needed unless otherwise documented below in the visit note. 

## 2014-09-22 NOTE — Assessment & Plan Note (Signed)
D/C O2

## 2014-09-22 NOTE — Assessment & Plan Note (Signed)
TSH pending. 

## 2014-09-22 NOTE — Patient Instructions (Addendum)
As we discussed the neurotransmitters are essential for good brain function, both intellectually & emotionally. The agents to increase the neurotransmitter levels are not addictive and simply keep this essential neurotransmitter at therapeutic levels. If these levels become severely depleted; depression or panic attacks can occur.  Cardiovascular exercise, this can be as simple a program as walking, is recommended 30-45 minutes 3-4 times per week. If you're not exercising you should take 6-8 weeks to build up to this level.  Please stop oxygen.  Check TSH late this month or early Nov

## 2014-09-23 ENCOUNTER — Telehealth: Payer: Self-pay | Admitting: Internal Medicine

## 2014-09-23 NOTE — Telephone Encounter (Signed)
   Please discontinue oxygen. This was based on oxygen desaturation testing results by Physical Therapist from [place]

## 2014-09-23 NOTE — Telephone Encounter (Signed)
Faxed to [facility] [phone]

## 2014-09-23 NOTE — Telephone Encounter (Signed)
Patient states Dr. Linna Darner discontinued oxygen.  She would like out of her house.  [facility] needs orders faxed to them at [phone].

## 2014-10-17 ENCOUNTER — Other Ambulatory Visit: Payer: Self-pay | Admitting: Internal Medicine

## 2014-10-17 ENCOUNTER — Other Ambulatory Visit (INDEPENDENT_AMBULATORY_CARE_PROVIDER_SITE_OTHER): Payer: Medicare Other

## 2014-10-17 DIAGNOSIS — E039 Hypothyroidism, unspecified: Secondary | ICD-10-CM

## 2014-10-17 LAB — TSH: TSH: 1.28 u[IU]/mL (ref 0.35–4.50)

## 2014-10-20 ENCOUNTER — Other Ambulatory Visit: Payer: Self-pay | Admitting: Internal Medicine

## 2014-10-29 ENCOUNTER — Telehealth: Payer: Self-pay

## 2014-10-29 NOTE — Telephone Encounter (Signed)
LVM for pt to return call.    RE: Needs to schedule AWV for 2015

## 2014-10-31 ENCOUNTER — Other Ambulatory Visit: Payer: Self-pay | Admitting: Cardiology

## 2014-10-31 NOTE — Telephone Encounter (Signed)
The pt is out of Eliquis at this time.  I have checked the [employer] and we currently do not have any samples.  I will forward this message to [facility] to check for samples and follow-up with the pt.

## 2014-10-31 NOTE — Telephone Encounter (Signed)
Returned call to patient phone rings busy. 

## 2014-10-31 NOTE — Telephone Encounter (Signed)
Pt called in stating that the pharmacy has had trouble refilling her Eliquis and she is unsure of why. She only has 1 day left. Please call and notify her of what the issue might be.   Thanks

## 2014-10-31 NOTE — Telephone Encounter (Signed)
Called the insurance company told her to call us back to let you know that they were sending a fax to Dr. Percival Spanish . She is out of medication . Please call if you have any questions

## 2014-11-01 NOTE — Telephone Encounter (Signed)
Pt calling again,says she needs her medicine today. She will even take some samples if you have them,please call asap.She wants to come over today before 5:00.

## 2014-11-01 NOTE — Telephone Encounter (Signed)
Returning your call from yesterday,said her phone was off the hook.

## 2014-11-01 NOTE — Telephone Encounter (Signed)
Left message on personal voicemail that samples left at front desk.

## 2014-11-01 NOTE — Telephone Encounter (Signed)
Spoke with patient and let her know that samples would be at front desk. Patient voiced understanding

## 2014-11-01 NOTE — Telephone Encounter (Signed)
Pt is calling back to speak to Dr. Rosezella Florida nurse about getting some more Eliquis

## 2014-11-03 ENCOUNTER — Telehealth: Payer: Self-pay | Admitting: *Deleted

## 2014-11-03 NOTE — Telephone Encounter (Signed)
P.A. For Eliquis filled out and Faxed to [facility].

## 2014-11-04 ENCOUNTER — Telehealth: Payer: Self-pay | Admitting: Cardiology

## 2014-11-04 NOTE — Telephone Encounter (Signed)
P.A. Form wasn't filled out all the way on 11/02/14 and was denied due to lack of information. I refilled out for yesterday and am still awaiting a response. Spoke with patient and let her know why she got the denial call and that I will call her back when the approval come through. Patient voiced understanding.

## 2014-11-04 NOTE — Telephone Encounter (Signed)
Pt said [facility] left a message that her Eliquis was denied.Please call her.

## 2014-11-09 ENCOUNTER — Telehealth: Payer: Self-pay | Admitting: Cardiology

## 2014-11-09 NOTE — Telephone Encounter (Signed)
P.A. Has been sent multiple times. Online P.A. Was sent in this morning as well through [email or web address]. Let patient know we are still waiting approval. Patient states she still has 3 weeks of samples left.

## 2014-11-09 NOTE — Telephone Encounter (Signed)
Please call,pt still have not received her Eliquis.Please call and let her know what is going on.

## 2014-11-10 ENCOUNTER — Encounter (HOSPITAL_COMMUNITY): Payer: Self-pay | Admitting: Emergency Medicine

## 2014-11-10 ENCOUNTER — Inpatient Hospital Stay (HOSPITAL_COMMUNITY)
Admission: EM | Admit: 2014-11-10 | Discharge: 2014-11-21 | DRG: 350 | Disposition: A | Discharge status: Home / Self Care | Payer: Medicare Other | Attending: General Surgery | Admitting: General Surgery

## 2014-11-10 ENCOUNTER — Emergency Department (HOSPITAL_COMMUNITY): Payer: Medicare Other

## 2014-11-10 DIAGNOSIS — Z8679 Personal history of other diseases of the circulatory system: Secondary | ICD-10-CM

## 2014-11-10 DIAGNOSIS — H5442 Blindness, left eye, normal vision right eye: Secondary | ICD-10-CM | POA: Diagnosis present

## 2014-11-10 DIAGNOSIS — Z96612 Presence of left artificial shoulder joint: Secondary | ICD-10-CM | POA: Diagnosis present

## 2014-11-10 DIAGNOSIS — J441 Chronic obstructive pulmonary disease with (acute) exacerbation: Secondary | ICD-10-CM

## 2014-11-10 DIAGNOSIS — K4131 Unilateral femoral hernia, with obstruction, without gangrene, recurrent: Secondary | ICD-10-CM | POA: Diagnosis not present

## 2014-11-10 DIAGNOSIS — Z79899 Other long term (current) drug therapy: Secondary | ICD-10-CM

## 2014-11-10 DIAGNOSIS — I69398 Other sequelae of cerebral infarction: Secondary | ICD-10-CM

## 2014-11-10 DIAGNOSIS — J811 Chronic pulmonary edema: Secondary | ICD-10-CM | POA: Diagnosis present

## 2014-11-10 DIAGNOSIS — H409 Unspecified glaucoma: Secondary | ICD-10-CM | POA: Diagnosis present

## 2014-11-10 DIAGNOSIS — Z9981 Dependence on supplemental oxygen: Secondary | ICD-10-CM

## 2014-11-10 DIAGNOSIS — Z881 Allergy status to other antibiotic agents status: Secondary | ICD-10-CM

## 2014-11-10 DIAGNOSIS — J449 Chronic obstructive pulmonary disease, unspecified: Secondary | ICD-10-CM | POA: Diagnosis present

## 2014-11-10 DIAGNOSIS — E785 Hyperlipidemia, unspecified: Secondary | ICD-10-CM | POA: Diagnosis present

## 2014-11-10 DIAGNOSIS — E039 Hypothyroidism, unspecified: Secondary | ICD-10-CM | POA: Diagnosis present

## 2014-11-10 DIAGNOSIS — I9581 Postprocedural hypotension: Secondary | ICD-10-CM | POA: Diagnosis not present

## 2014-11-10 DIAGNOSIS — I350 Nonrheumatic aortic (valve) stenosis: Secondary | ICD-10-CM | POA: Diagnosis present

## 2014-11-10 DIAGNOSIS — K4031 Unilateral inguinal hernia, with obstruction, without gangrene, recurrent: Secondary | ICD-10-CM | POA: Diagnosis present

## 2014-11-10 DIAGNOSIS — Z96649 Presence of unspecified artificial hip joint: Secondary | ICD-10-CM | POA: Diagnosis present

## 2014-11-10 DIAGNOSIS — R0902 Hypoxemia: Secondary | ICD-10-CM | POA: Diagnosis present

## 2014-11-10 DIAGNOSIS — R109 Unspecified abdominal pain: Secondary | ICD-10-CM

## 2014-11-10 DIAGNOSIS — M810 Age-related osteoporosis without current pathological fracture: Secondary | ICD-10-CM | POA: Diagnosis present

## 2014-11-10 DIAGNOSIS — Z888 Allergy status to other drugs, medicaments and biological substances status: Secondary | ICD-10-CM

## 2014-11-10 DIAGNOSIS — R7981 Abnormal blood-gas level: Secondary | ICD-10-CM

## 2014-11-10 DIAGNOSIS — Z885 Allergy status to narcotic agent status: Secondary | ICD-10-CM

## 2014-11-10 DIAGNOSIS — J9601 Acute respiratory failure with hypoxia: Secondary | ICD-10-CM | POA: Diagnosis not present

## 2014-11-10 DIAGNOSIS — J9690 Respiratory failure, unspecified, unspecified whether with hypoxia or hypercapnia: Secondary | ICD-10-CM

## 2014-11-10 DIAGNOSIS — Z86010 Personal history of colonic polyps: Secondary | ICD-10-CM

## 2014-11-10 DIAGNOSIS — Z8673 Personal history of transient ischemic attack (TIA), and cerebral infarction without residual deficits: Secondary | ICD-10-CM

## 2014-11-10 DIAGNOSIS — Z87891 Personal history of nicotine dependence: Secondary | ICD-10-CM

## 2014-11-10 DIAGNOSIS — I482 Chronic atrial fibrillation: Secondary | ICD-10-CM | POA: Diagnosis present

## 2014-11-10 DIAGNOSIS — Z7901 Long term (current) use of anticoagulants: Secondary | ICD-10-CM

## 2014-11-10 DIAGNOSIS — I4891 Unspecified atrial fibrillation: Secondary | ICD-10-CM | POA: Diagnosis present

## 2014-11-10 DIAGNOSIS — Z823 Family history of stroke: Secondary | ICD-10-CM

## 2014-11-10 DIAGNOSIS — K56609 Unspecified intestinal obstruction, unspecified as to partial versus complete obstruction: Secondary | ICD-10-CM

## 2014-11-10 LAB — CBC WITH DIFFERENTIAL/PLATELET
Basophils Absolute: 0.1 10*3/uL (ref 0.0–0.1)
Basophils Relative: 1 % (ref 0–1)
EOS ABS: 0.1 10*3/uL (ref 0.0–0.7)
Eosinophils Relative: 2 % (ref 0–5)
HCT: 43.4 % (ref 36.0–46.0)
HEMOGLOBIN: 14.6 g/dL (ref 12.0–15.0)
LYMPHS ABS: 1.8 10*3/uL (ref 0.7–4.0)
Lymphocytes Relative: 28 % (ref 12–46)
MCH: 29.4 pg (ref 26.0–34.0)
MCHC: 33.6 g/dL (ref 30.0–36.0)
MCV: 87.5 fL (ref 78.0–100.0)
Monocytes Absolute: 0.7 10*3/uL (ref 0.1–1.0)
Monocytes Relative: 11 % (ref 3–12)
NEUTROS ABS: 3.7 10*3/uL (ref 1.7–7.7)
Neutrophils Relative %: 58 % (ref 43–77)
Platelets: 193 10*3/uL (ref 150–400)
RBC: 4.96 MIL/uL (ref 3.87–5.11)
RDW: 14.3 % (ref 11.5–15.5)
WBC: 6.3 10*3/uL (ref 4.0–10.5)

## 2014-11-10 LAB — BASIC METABOLIC PANEL
Anion gap: 14 (ref 5–15)
BUN: 14 mg/dL (ref 6–23)
CHLORIDE: 98 meq/L (ref 96–112)
CO2: 26 mEq/L (ref 19–32)
CREATININE: 0.94 mg/dL (ref 0.50–1.10)
Calcium: 9.9 mg/dL (ref 8.4–10.5)
GFR calc Af Amer: 62 mL/min — ABNORMAL LOW (ref >90)
GFR calc non Af Amer: 54 mL/min — ABNORMAL LOW (ref >90)
GLUCOSE: 94 mg/dL (ref 70–99)
POTASSIUM: 4.6 meq/L (ref 3.7–5.3)
Sodium: 138 mEq/L (ref 137–147)

## 2014-11-10 LAB — URINALYSIS, ROUTINE W REFLEX MICROSCOPIC
BILIRUBIN URINE: NEGATIVE
Glucose, UA: NEGATIVE mg/dL
Hgb urine dipstick: NEGATIVE
Ketones, ur: NEGATIVE mg/dL
Leukocytes, UA: NEGATIVE
Nitrite: NEGATIVE
PROTEIN: NEGATIVE mg/dL
Specific Gravity, Urine: 1.012 (ref 1.005–1.030)
Urobilinogen, UA: 0.2 mg/dL (ref 0.0–1.0)
pH: 7 (ref 5.0–8.0)

## 2014-11-10 MED ORDER — MORPHINE SULFATE 4 MG/ML IJ SOLN
4.0000 mg | Freq: Once | INTRAMUSCULAR | Status: AC
  Administered 2014-11-10: 4 mg via INTRAVENOUS
  Filled 2014-11-10: qty 1

## 2014-11-10 MED ORDER — ONDANSETRON HCL 4 MG/2ML IJ SOLN
4.0000 mg | Freq: Once | INTRAMUSCULAR | Status: AC
  Administered 2014-11-10: 4 mg via INTRAVENOUS
  Filled 2014-11-10: qty 2

## 2014-11-10 MED ORDER — IOHEXOL 300 MG/ML  SOLN: 50.0000 mL | Freq: Once | INTRAMUSCULAR | Status: AC | PRN

## 2014-11-10 NOTE — ED Provider Notes (Signed)
CSN: [id number]     Arrival date & time 11/10/14  2136 History   First MD Initiated Contact with Patient 11/10/14 2302     Chief Complaint  Patient presents with  . Abdominal Pain     (Consider location/radiation/quality/duration/timing/severity/associated sxs/prior Treatment) HPI 80 year old female presents to emergency department with complaint of acute onset of right lower and left lower abdominal pain tonight at 7 PM.  Patient has had nausea without vomiting.  She reports normal bowel movement earlier today.  Patient status post femoral hernia repair on the right with mesh in August.  She reports symptoms are similar to the pain she had at that time.  Patient has history of atrial fibrillation, is on Eliquis.  She denies any urinary symptoms, no constipation. Past Medical History  Diagnosis Date  . Hx of colonic polyps     last colonoscopy 2005, repeat was due 2010 Dr. Sharlett Iles  . Atrial fibrillation     Dr Percival Spanish  . Fracture 1995, 2009    RUE; LUE Dr. Durward Fortes  . 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 Cystitis   . Complication of anesthesia     difficulty with arousal post op  . Hyperlipidemia   . Hyperlipidemia   . Osteoporosis   . Cerebrovascular accident 01/09  . Hypothyroidism    Past Surgical History  Procedure Laterality Date  . Tonsillectomy and adenoidectomy    . Vocal cord polyps    . Thyroid needle biopsy  2002  . Appendectomy    . Cataract extraction      Bilat  . Colonoscopy w/ polypectomy  2005    Diverticulosis;Dr. Sharlett Iles  . Total hip arthroplasty  2008    CHF & RAF post op   . Total shoulder replacement  2010    Dr Durward Fortes - left shoulder  . Mohs surgery  2013    nasal Basal Cell; Dr Sarajane Jews  . Excision metacarpal mass Right 08/17/2013    Procedure: RIGHT LONG EXCISION MASS AND DIP JOINT DEBRIDEMENT;  Surgeon: Tennis Must, MD;  Location: [facility];  Service: Orthopedics;  Laterality: Right;  . Femoral  hernia repair Right 08/17/2014    Procedure: OPEN REPAIR RIGHT FEMORAL HERNIA  WITH INSERTION OF MESH;  Surgeon: Adin Hector, MD;  Location: [facility];  Service: General;  Laterality: Right;  . Insertion of mesh  08/17/2014    Procedure: INSERTION OF MESH;  Surgeon: Adin Hector, MD;  Location: [facility];  Service: General;;  . Hernia repair     Family History  Problem Relation Age of Onset  . Stroke Father     > 81  . Diabetes Father   . Leukemia Mother   . Cancer Mother     leukemia  . Colon cancer Brother     Valve replacement  . Heart attack Brother   . Breast cancer Sister   . Ovarian cancer Sister   . Lung cancer Sister     smoker  . Prostate cancer Brother   . Cancer Brother     bladder   History  Substance Use Topics  . Smoking status: Former Smoker    Quit date: 12/23/1993  . Smokeless tobacco: Not on file     Comment: Smoked 1950-1995, up to 1 ppd  . Alcohol Use: No   OB History    No data available     Review of Systems   See History of Present Illness;  otherwise all other systems are reviewed and negative  Allergies  Desmopressin acetate; Dorzolamide hcl; Hydrocodone-acetaminophen; Levofloxacin; and Ciprofloxacin  Home Medications   Prior to Admission medications   Medication Sig Start Date End Date Taking? Authorizing Provider  acetaminophen (TYLENOL) 325 MG tablet You can take 2 tablets every 4 hours as needed.  You cannot take more than 4000 mg of tylenol per day.  This is also in your sleep aide, and it is in your prescribed pain medicine. So you need to figure out.  Do not take more than 12 tablets of tylenol (acetaminophen) per day. 08/19/14  Yes Earnstine Regal, PA-C  apixaban (ELIQUIS) 2.5 MG TABS tablet Take 2.5 mg by mouth 2 (two) times daily.   Yes Historical Provider, MD  B Complex Vitamins (VITAMIN B COMPLEX) TABS Take 1 tablet by mouth daily.    Yes Historical Provider, MD  bimatoprost (LUMIGAN) 0.03 % ophthalmic solution Place 1 drop into  both eyes at bedtime.    Yes Historical Provider, MD  calcium carbonate (TUMS - DOSED IN MG ELEMENTAL CALCIUM) 500 MG chewable tablet Chew 2 tablets by mouth daily as needed for indigestion or heartburn (indigestion).   Yes Historical Provider, MD  Calcium Carbonate-Vitamin D (CALCIUM-VITAMIN D) 500-200 MG-UNIT per tablet Take 1 tablet by mouth daily.    Yes Historical Provider, MD  Cholecalciferol (VITAMIN D) 2000 UNITS tablet Take 2,000 Units by mouth daily.   Yes Historical Provider, MD  Cranberry (CRANBERRY CONCENTRATE) 500 MG CAPS Take 2 capsules by mouth daily.     Yes Historical Provider, MD  digoxin (LANOXIN) 0.125 MG tablet Take 0.125 mg by mouth at bedtime.    Yes Historical Provider, MD  diltiazem (CARDIZEM CD) 120 MG 24 hr capsule Take 120 mg by mouth daily.   Yes Historical Provider, MD  diphenhydramine-acetaminophen (TYLENOL PM) 25-500 MG TABS Take 2 tablets by mouth at bedtime as needed (sleep). Infrequently   Yes Historical Provider, MD  ENSURE PLUS (ENSURE PLUS) LIQD Take 1 Can by mouth daily. 09/21/13  Yes Hendricks Limes, MD  estradiol (ESTRACE) 0.1 MG/GM vaginal cream Place 2 g vaginally. Twice weekly   Yes Historical Provider, MD  levothyroxine (SYNTHROID, LEVOTHROID) 50 MCG tablet Take 50 mcg by mouth daily. On Weds ONLY takes 1 1/2 tabs. 08/23/14  Yes Hendricks Limes, MD  LORazepam (ATIVAN) 1 MG tablet Take 0.5 mg by mouth every 8 (eight) hours as needed for anxiety or sleep (sleep).    Yes Historical Provider, MD  Magnesium Oxide 500 MG TABS Take 1 tablet by mouth at bedtime.    Yes Historical Provider, MD  Probiotic Product (MISC INTESTINAL FLORA REGULAT) CAPS Take 1 capsule by mouth daily.     Yes Historical Provider, MD  sertraline (ZOLOFT) 50 MG tablet Take 1 tablet (50 mg total) by mouth daily. 09/22/14  Yes Hendricks Limes, MD  timolol (TIMOPTIC) 0.5 % ophthalmic solution Place 1 drop into both eyes 2 (two) times daily.    Yes Historical Provider, MD  trimethoprim  (TRIMPEX) 100 MG tablet Take 100 mg by mouth every other day.   Yes Historical Provider, MD  VYTORIN 10-40 MG per tablet TAKE 1 TABLET ONCE DAILY. 10/20/14  Yes Hendricks Limes, MD   BP 178/88 mmHg  Pulse 75  Temp(Src) 97.5 F (36.4 C) (Oral)  Resp 16  SpO2 92% Physical Exam  Constitutional: She is oriented to person, place, and time. She appears well-developed and well-nourished. She appears distressed.  HENT:  Head: Normocephalic and atraumatic.  Nose: Nose normal.  Mouth/Throat: Oropharynx is clear and moist.  Eyes: Conjunctivae and EOM are normal. Pupils are equal, round, and reactive to light.  Neck: Normal range of motion. Neck supple. No JVD present. No tracheal deviation present. No thyromegaly present.  Cardiovascular: Normal rate, regular rhythm, normal heart sounds and intact distal pulses.  Exam reveals no gallop and no friction rub.   No murmur heard. Pulmonary/Chest: Effort normal and breath sounds normal. No stridor. No respiratory distress. She has no wheezes. She has no rales. She exhibits no tenderness.  Abdominal: Soft. She exhibits no distension and no mass. There is tenderness. There is guarding (voluntary). There is no rebound.  Patient has hyperactive bowel sounds, tender mainly in the right lower quadrant and left lower quadrant  Musculoskeletal: Normal range of motion. She exhibits no edema or tenderness.  Lymphadenopathy:    She has no cervical adenopathy.  Neurological: She is alert and oriented to person, place, and time. She displays normal reflexes. She exhibits normal muscle tone. Coordination normal.  Skin: Skin is warm and dry. No rash noted. No erythema. No pallor.  Psychiatric: She has a normal mood and affect. Her behavior is normal. Judgment and thought content normal.  Nursing note and vitals reviewed.   ED Course  Procedures (including critical care time) Labs Review Labs Reviewed  BASIC METABOLIC PANEL - Abnormal; Notable for the following:     GFR calc non Af Amer 54 (*)    GFR calc Af Amer 62 (*)    All other components within normal limits  URINALYSIS, ROUTINE W REFLEX MICROSCOPIC - Abnormal; Notable for the following:    APPearance CLOUDY (*)    All other components within normal limits  URINE CULTURE  CBC WITH DIFFERENTIAL    Imaging Review Ct Abdomen Pelvis W Contrast  11/11/2014   CLINICAL DATA:  Right lower quadrant pain for 1 day  EXAM: CT ABDOMEN AND PELVIS WITH CONTRAST  TECHNIQUE: Multidetector CT imaging of the abdomen and pelvis was performed using the standard protocol following bolus administration of intravenous contrast.  CONTRAST:  173mL OMNIPAQUE IOHEXOL 300 MG/ML SOLN, 63mL OMNIPAQUE IOHEXOL 300 MG/ML SOLN  COMPARISON:  08/14/2014  FINDINGS: Lung bases demonstrate some fibrotic changes stable from the previous exam. No acute infiltrate is noted.  The liver, gallbladder, spleen, adrenal glands and pancreas are stable in appearance from the prior exam. Multiple calcified splenic granulomas are noted. Kidneys are well visualized and demonstrate a normal enhancement pattern. Delayed images through the kidneys demonstrate normal excretion of contrast. No calculi or obstructive changes are noted.  The appendix is not visualize consistent with the patient's clinical history. Diverticulosis without diverticulitis is noted. Mild small bowel obstructive changes are noted to secondary to an incarcerated bowel loop within a right inguinal hernia. The overall appearance is similar to that seen on the prior exam although the patient gives a history of recent hernia repair. No free air is seen. No perforation is noted. Diffuse aortoiliac calcifications are seen. No acute bony abnormality is noted. Multilevel degenerative changes of the lumbar spine are seen. A right hip replacement is noted.  IMPRESSION: Changes consistent with a recurrent right inguinal hernia despite previous surgical repair. There is incarceration of a loop of  small bowel within the hernia with resultant small bowel obstruction. The overall appearance is similar to that seen in August of 2015.   Electronically Signed   By: Inez Catalina M.D.   On: 11/11/2014  00:37     EKG Interpretation None      MDM   Final diagnoses:  Abdominal pain    80 year old female with lower abdominal pain.  History of small bowel obstruction, status post right femoral hernia repair with mesh.  Plan for labs, CT abdomen pelvis pain control.  With patient in Trendelenburg, I was able to reduce her right inguinal hernia with minimal discomfort to the patient.  Dr. gross to see the patient and evaluate for admission.    Kalman Drape, MD 11/11/14 [phone]

## 2014-11-10 NOTE — ED Notes (Signed)
Pt states she is having lower abd pain that started tonight  Pt states the pain is the same as she had in August when she had to have hernia repair  Pt states the pain is right above her pelvis and runs straight across  Pt has nausea without vomiting or diarrhea

## 2014-11-11 ENCOUNTER — Inpatient Hospital Stay (HOSPITAL_COMMUNITY): Payer: Medicare Other

## 2014-11-11 ENCOUNTER — Telehealth: Payer: Self-pay | Admitting: *Deleted

## 2014-11-11 DIAGNOSIS — E039 Hypothyroidism, unspecified: Secondary | ICD-10-CM | POA: Diagnosis present

## 2014-11-11 DIAGNOSIS — I482 Chronic atrial fibrillation: Secondary | ICD-10-CM | POA: Diagnosis present

## 2014-11-11 DIAGNOSIS — I69398 Other sequelae of cerebral infarction: Secondary | ICD-10-CM | POA: Diagnosis not present

## 2014-11-11 DIAGNOSIS — H5442 Blindness, left eye, normal vision right eye: Secondary | ICD-10-CM | POA: Diagnosis present

## 2014-11-11 DIAGNOSIS — J9601 Acute respiratory failure with hypoxia: Secondary | ICD-10-CM | POA: Diagnosis not present

## 2014-11-11 DIAGNOSIS — J811 Chronic pulmonary edema: Secondary | ICD-10-CM | POA: Diagnosis present

## 2014-11-11 DIAGNOSIS — J449 Chronic obstructive pulmonary disease, unspecified: Secondary | ICD-10-CM | POA: Diagnosis present

## 2014-11-11 DIAGNOSIS — K4131 Unilateral femoral hernia, with obstruction, without gangrene, recurrent: Secondary | ICD-10-CM | POA: Diagnosis present

## 2014-11-11 DIAGNOSIS — Z8673 Personal history of transient ischemic attack (TIA), and cerebral infarction without residual deficits: Secondary | ICD-10-CM | POA: Diagnosis not present

## 2014-11-11 DIAGNOSIS — Z87891 Personal history of nicotine dependence: Secondary | ICD-10-CM | POA: Diagnosis not present

## 2014-11-11 DIAGNOSIS — M810 Age-related osteoporosis without current pathological fracture: Secondary | ICD-10-CM | POA: Diagnosis present

## 2014-11-11 DIAGNOSIS — K4031 Unilateral inguinal hernia, with obstruction, without gangrene, recurrent: Secondary | ICD-10-CM | POA: Diagnosis present

## 2014-11-11 DIAGNOSIS — I350 Nonrheumatic aortic (valve) stenosis: Secondary | ICD-10-CM | POA: Diagnosis present

## 2014-11-11 DIAGNOSIS — Z885 Allergy status to narcotic agent status: Secondary | ICD-10-CM | POA: Diagnosis not present

## 2014-11-11 DIAGNOSIS — Z96612 Presence of left artificial shoulder joint: Secondary | ICD-10-CM | POA: Diagnosis present

## 2014-11-11 DIAGNOSIS — I9581 Postprocedural hypotension: Secondary | ICD-10-CM | POA: Diagnosis not present

## 2014-11-11 DIAGNOSIS — Z823 Family history of stroke: Secondary | ICD-10-CM | POA: Diagnosis not present

## 2014-11-11 DIAGNOSIS — Z881 Allergy status to other antibiotic agents status: Secondary | ICD-10-CM | POA: Diagnosis not present

## 2014-11-11 DIAGNOSIS — Z79899 Other long term (current) drug therapy: Secondary | ICD-10-CM | POA: Diagnosis not present

## 2014-11-11 DIAGNOSIS — Z7901 Long term (current) use of anticoagulants: Secondary | ICD-10-CM | POA: Diagnosis not present

## 2014-11-11 DIAGNOSIS — Z86010 Personal history of colonic polyps: Secondary | ICD-10-CM | POA: Diagnosis not present

## 2014-11-11 DIAGNOSIS — Z9981 Dependence on supplemental oxygen: Secondary | ICD-10-CM | POA: Diagnosis not present

## 2014-11-11 DIAGNOSIS — Z888 Allergy status to other drugs, medicaments and biological substances status: Secondary | ICD-10-CM | POA: Diagnosis not present

## 2014-11-11 DIAGNOSIS — Z96649 Presence of unspecified artificial hip joint: Secondary | ICD-10-CM | POA: Diagnosis present

## 2014-11-11 DIAGNOSIS — H409 Unspecified glaucoma: Secondary | ICD-10-CM | POA: Diagnosis present

## 2014-11-11 DIAGNOSIS — E785 Hyperlipidemia, unspecified: Secondary | ICD-10-CM | POA: Diagnosis present

## 2014-11-11 MED ORDER — METOPROLOL TARTRATE 1 MG/ML IV SOLN
5.0000 mg | Freq: Four times a day (QID) | INTRAVENOUS | Status: DC | PRN
  Filled 2014-11-11: qty 5

## 2014-11-11 MED ORDER — APIXABAN 2.5 MG PO TABS: 2.5000 mg | ORAL_TABLET | Freq: Two times a day (BID) | ORAL | Status: DC

## 2014-11-11 MED ORDER — LEVOTHYROXINE SODIUM 75 MCG PO TABS
75.0000 ug | ORAL_TABLET | ORAL | Status: DC
  Administered 2014-11-16: 75 ug via ORAL
  Filled 2014-11-11: qty 1

## 2014-11-11 MED ORDER — IOHEXOL 300 MG/ML  SOLN
100.0000 mL | Freq: Once | INTRAMUSCULAR | Status: AC | PRN
  Administered 2014-11-11: 100 mL via INTRAVENOUS

## 2014-11-11 MED ORDER — LACTATED RINGERS IV BOLUS (SEPSIS): 1000.0000 mL | Freq: Three times a day (TID) | INTRAVENOUS | Status: DC | PRN

## 2014-11-11 MED ORDER — SODIUM CHLORIDE 0.9 % IV SOLN: 250.0000 mL | INTRAVENOUS | Status: DC | PRN

## 2014-11-11 MED ORDER — DIGOXIN 125 MCG PO TABS
0.1250 mg | ORAL_TABLET | Freq: Every day | ORAL | Status: DC
  Administered 2014-11-11 – 2014-11-20 (×10): 0.125 mg via ORAL
  Filled 2014-11-11 (×13): qty 1

## 2014-11-11 MED ORDER — ACETAMINOPHEN 650 MG RE SUPP: 650.0000 mg | Freq: Four times a day (QID) | RECTAL | Status: DC | PRN

## 2014-11-11 MED ORDER — TRAMADOL HCL 50 MG PO TABS: 50.0000 mg | ORAL_TABLET | Freq: Four times a day (QID) | ORAL | Status: DC | PRN

## 2014-11-11 MED ORDER — MENTHOL 3 MG MT LOZG
1.0000 | LOZENGE | OROMUCOSAL | Status: DC | PRN
  Filled 2014-11-11: qty 9

## 2014-11-11 MED ORDER — PHENOL 1.4 % MT LIQD: 2.0000 | OROMUCOSAL | Status: DC | PRN

## 2014-11-11 MED ORDER — HYDROMORPHONE HCL 1 MG/ML IJ SOLN
0.5000 mg | INTRAMUSCULAR | Status: DC | PRN
  Administered 2014-11-15: 0.5 mg via INTRAVENOUS
  Administered 2014-11-16: 1 mg via INTRAVENOUS
  Filled 2014-11-11 (×2): qty 1

## 2014-11-11 MED ORDER — ESTRADIOL 0.1 MG/GM VA CREA
2.0000 g | TOPICAL_CREAM | VAGINAL | Status: DC
Start: 2014-11-11 — End: 2014-11-21
  Administered 2014-11-14 – 2014-11-21 (×3): 0.25 via VAGINAL
  Filled 2014-11-11: qty 42.5

## 2014-11-11 MED ORDER — LEVOTHYROXINE SODIUM 50 MCG PO TABS
50.0000 ug | ORAL_TABLET | ORAL | Status: DC
  Administered 2014-11-11 – 2014-11-21 (×9): 50 ug via ORAL
  Filled 2014-11-11 (×13): qty 1

## 2014-11-11 MED ORDER — ENSURE COMPLETE PO LIQD
237.0000 mL | Freq: Every day | ORAL | Status: DC
  Administered 2014-11-11 – 2014-11-20 (×8): 237 mL via ORAL
  Filled 2014-11-11: qty 237

## 2014-11-11 MED ORDER — KCL IN DEXTROSE-NACL 40-5-0.45 MEQ/L-%-% IV SOLN
INTRAVENOUS | Status: DC
Start: 2014-11-11 — End: 2014-11-11

## 2014-11-11 MED ORDER — FLORA-Q PO CAPS
1.0000 | ORAL_CAPSULE | Freq: Every day | ORAL | Status: DC
  Administered 2014-11-11 – 2014-11-21 (×10): 1 via ORAL
  Filled 2014-11-11 (×11): qty 1

## 2014-11-11 MED ORDER — EZETIMIBE-SIMVASTATIN 10-40 MG PO TABS: 1.0000 | ORAL_TABLET | Freq: Every day | ORAL | Status: DC

## 2014-11-11 MED ORDER — ATORVASTATIN CALCIUM 20 MG PO TABS
20.0000 mg | ORAL_TABLET | Freq: Every day | ORAL | Status: DC
  Administered 2014-11-11 – 2014-11-20 (×10): 20 mg via ORAL
  Filled 2014-11-11 (×10): qty 1
  Filled 2014-11-11: qty 2
  Filled 2014-11-11: qty 1

## 2014-11-11 MED ORDER — POLYETHYLENE GLYCOL 3350 17 G PO PACK
17.0000 g | PACK | Freq: Two times a day (BID) | ORAL | Status: DC
  Administered 2014-11-11 – 2014-11-21 (×9): 17 g via ORAL
  Filled 2014-11-11 (×16): qty 1

## 2014-11-11 MED ORDER — EZETIMIBE 10 MG PO TABS
10.0000 mg | ORAL_TABLET | Freq: Every day | ORAL | Status: DC
  Administered 2014-11-11 – 2014-11-21 (×10): 10 mg via ORAL
  Filled 2014-11-11 (×11): qty 1

## 2014-11-11 MED ORDER — DIPHENHYDRAMINE-APAP (SLEEP) 25-500 MG PO TABS: 2.0000 | ORAL_TABLET | Freq: Every evening | ORAL | Status: DC | PRN

## 2014-11-11 MED ORDER — SERTRALINE HCL 50 MG PO TABS
50.0000 mg | ORAL_TABLET | Freq: Every day | ORAL | Status: DC
  Administered 2014-11-11 – 2014-11-21 (×10): 50 mg via ORAL
  Filled 2014-11-11 (×11): qty 1

## 2014-11-11 MED ORDER — IOHEXOL 300 MG/ML  SOLN
50.0000 mL | Freq: Once | INTRAMUSCULAR | Status: AC | PRN
  Administered 2014-11-11: 50 mL via ORAL

## 2014-11-11 MED ORDER — LORAZEPAM 0.5 MG PO TABS
0.5000 mg | ORAL_TABLET | Freq: Three times a day (TID) | ORAL | Status: DC | PRN
  Administered 2014-11-15: 0.5 mg via ORAL
  Filled 2014-11-11: qty 1

## 2014-11-11 MED ORDER — BISACODYL 10 MG RE SUPP
10.0000 mg | Freq: Two times a day (BID) | RECTAL | Status: DC | PRN
  Administered 2014-11-13: 10 mg via RECTAL
  Filled 2014-11-11 (×2): qty 1

## 2014-11-11 MED ORDER — MAGIC MOUTHWASH
15.0000 mL | Freq: Four times a day (QID) | ORAL | Status: DC | PRN
  Filled 2014-11-11: qty 15

## 2014-11-11 MED ORDER — DIPHENHYDRAMINE HCL 50 MG/ML IJ SOLN: 12.5000 mg | Freq: Four times a day (QID) | INTRAMUSCULAR | Status: DC | PRN

## 2014-11-11 MED ORDER — B COMPLEX-C PO TABS
1.0000 | ORAL_TABLET | Freq: Every day | ORAL | Status: DC
  Administered 2014-11-11 – 2014-11-21 (×9): 1 via ORAL
  Filled 2014-11-11 (×11): qty 1

## 2014-11-11 MED ORDER — FUROSEMIDE 10 MG/ML IJ SOLN
40.0000 mg | Freq: Two times a day (BID) | INTRAMUSCULAR | Status: AC
  Administered 2014-11-12: 40 mg via INTRAVENOUS
  Filled 2014-11-11 (×4): qty 4

## 2014-11-11 MED ORDER — CALCIUM CARBONATE-VITAMIN D 500-200 MG-UNIT PO TABS
1.0000 | ORAL_TABLET | Freq: Every day | ORAL | Status: DC
  Administered 2014-11-11 – 2014-11-21 (×9): 1 via ORAL
  Filled 2014-11-11 (×11): qty 1

## 2014-11-11 MED ORDER — TIMOLOL MALEATE 0.5 % OP SOLN
1.0000 [drp] | Freq: Two times a day (BID) | OPHTHALMIC | Status: DC
  Administered 2014-11-11 – 2014-11-21 (×19): 1 [drp] via OPHTHALMIC
  Filled 2014-11-11: qty 5

## 2014-11-11 MED ORDER — LATANOPROST 0.005 % OP SOLN
1.0000 [drp] | Freq: Every day | OPHTHALMIC | Status: DC
  Administered 2014-11-11 – 2014-11-20 (×9): 1 [drp] via OPHTHALMIC
  Filled 2014-11-11 (×2): qty 2.5

## 2014-11-11 MED ORDER — DIPHENHYDRAMINE HCL 12.5 MG/5ML PO ELIX
12.5000 mg | ORAL_SOLUTION | Freq: Four times a day (QID) | ORAL | Status: DC | PRN
Start: 2014-11-11 — End: 2014-11-21

## 2014-11-11 MED ORDER — POLYETHYLENE GLYCOL 3350 17 G PO PACK
17.0000 g | PACK | Freq: Two times a day (BID) | ORAL | Status: DC | PRN
  Administered 2014-11-15: 17 g via ORAL
  Filled 2014-11-11 (×2): qty 1

## 2014-11-11 MED ORDER — ACETAMINOPHEN 500 MG PO TABS
1000.0000 mg | ORAL_TABLET | Freq: Every evening | ORAL | Status: DC | PRN
  Administered 2014-11-15: 1000 mg via ORAL
  Administered 2014-11-19: 650 mg via ORAL
  Filled 2014-11-11: qty 2

## 2014-11-11 MED ORDER — PROMETHAZINE HCL 25 MG/ML IJ SOLN
6.2500 mg | INTRAMUSCULAR | Status: DC | PRN
  Filled 2014-11-11 (×2): qty 1

## 2014-11-11 MED ORDER — TRIMETHOPRIM 100 MG PO TABS
100.0000 mg | ORAL_TABLET | ORAL | Status: DC
Start: 2014-11-11 — End: 2014-11-21
  Administered 2014-11-11 – 2014-11-21 (×5): 100 mg via ORAL
  Filled 2014-11-11 (×6): qty 1

## 2014-11-11 MED ORDER — ALUM & MAG HYDROXIDE-SIMETH 200-200-20 MG/5ML PO SUSP
30.0000 mL | Freq: Four times a day (QID) | ORAL | Status: DC | PRN
  Administered 2014-11-15: 30 mL via ORAL
  Filled 2014-11-11: qty 30

## 2014-11-11 MED ORDER — SODIUM CHLORIDE 0.9 % IJ SOLN
3.0000 mL | INTRAMUSCULAR | Status: DC | PRN
Start: 2014-11-11 — End: 2014-11-13

## 2014-11-11 MED ORDER — DILTIAZEM HCL ER COATED BEADS 120 MG PO CP24
120.0000 mg | ORAL_CAPSULE | Freq: Every day | ORAL | Status: DC
  Administered 2014-11-12 – 2014-11-21 (×9): 120 mg via ORAL
  Filled 2014-11-11 (×12): qty 1

## 2014-11-11 MED ORDER — ZOLPIDEM TARTRATE 5 MG PO TABS
5.0000 mg | ORAL_TABLET | Freq: Every evening | ORAL | Status: DC | PRN
  Administered 2014-11-11 – 2014-11-20 (×8): 5 mg via ORAL
  Filled 2014-11-11 (×8): qty 1

## 2014-11-11 MED ORDER — ONDANSETRON HCL 4 MG/2ML IJ SOLN
4.0000 mg | Freq: Four times a day (QID) | INTRAMUSCULAR | Status: DC | PRN
  Administered 2014-11-17 (×2): 4 mg via INTRAVENOUS
  Filled 2014-11-11 (×2): qty 2

## 2014-11-11 MED ORDER — LIP MEDEX EX OINT
1.0000 "application " | TOPICAL_OINTMENT | Freq: Two times a day (BID) | CUTANEOUS | Status: DC
  Administered 2014-11-11 – 2014-11-21 (×19): 1 via TOPICAL
  Filled 2014-11-11 (×3): qty 7

## 2014-11-11 MED ORDER — ACETAMINOPHEN 325 MG PO TABS
650.0000 mg | ORAL_TABLET | Freq: Four times a day (QID) | ORAL | Status: DC | PRN
  Administered 2014-11-11 – 2014-11-20 (×6): 650 mg via ORAL
  Filled 2014-11-11 (×6): qty 2

## 2014-11-11 MED ORDER — SODIUM CHLORIDE 0.9 % IJ SOLN
3.0000 mL | Freq: Two times a day (BID) | INTRAMUSCULAR | Status: DC
  Administered 2014-11-11 – 2014-11-12 (×4): 3 mL via INTRAVENOUS

## 2014-11-11 NOTE — Telephone Encounter (Signed)
Spoke with pharmacy and got confirmation that her Eliquis was approved. Spoke with and informed patient's husband who stated that Kristine Simmons is currently in the hospital due to hernia complications and will have surgery soon.

## 2014-11-11 NOTE — H&P (Signed)
[facility]  [address] [place], [place] [address] Phone: [phone] FAX: [phone]     BREELY PANIK  1927-04-23 [id number]  CARE TEAM:  PCP: Unice Cobble, MD  Outpatient Care Team: Patient Care Team: Hendricks Limes, MD as PCP - General Minus Breeding, MD as Consulting Physician (Cardiology) Fanny Skates, MD as Consulting Physician (General Surgery)  Inpatient Treatment Team: Treatment Team: Attending Provider: Nolon Nations, MD; Registered Nurse: Grier Mitts, RN; Technician: Edd Fabian, NT; Registered Nurse: Lorenza Evangelist, RN; Consulting Physician: Nolon Nations, MD  This patient is a 80 y.o.female who presents today for surgical evaluation at the request of Dr Sharol Given.   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  History of COPD with recent oxygen requirements.  She developed incarcerated inguinal and femoral hernia.  Urgently reduced.  Anticoagulation held.  Cleared by cardiology with recent echocardiogram showing good ejection fraction around 60%.  Medicine help follow.  Underwent open repair with mesh.  This was in late August.  Struggled with some postoperative hypoxemia.  Went home on home oxygen.  Stabilized and removed off this by her primary care physician the next month.  Seen in surgery office recovering.  She finally started feeling much better this month.  However, she noticed a sharp pain and bulging at 7:00 tonight.  Concern for recurrent hernia.  Came in the hospital.  Seen by emergency team.  Exam concerning for hernia.  CAT scan done concerning for hernia.  Initially not able to be reduced but on second attempt was able to reduce hernia.  Patient feeling better now.  She did have some periumbilical abdominal pain and discomfort - much  less after hernia reduction.  She still feels somewhat distended.  She did have a bowel movement earlier this morning but no flatus or bowel movement since the episode of incarceration.  Husband at bedside.  She is concerned about a recurrent hernia & concerned about oxygen needs and possible surgery.  Past Medical History  Diagnosis Date  . Hx of colonic polyps     last colonoscopy 2005, repeat was due 2010 Dr. Sharlett Iles  . Atrial fibrillation     Dr Percival Spanish  . Fracture 1995, 2009    RUE; LUE Dr. Durward Fortes  . 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 Cystitis   . Complication of anesthesia     difficulty with arousal post op  . Hyperlipidemia   . Hyperlipidemia   . Osteoporosis   . Cerebrovascular accident 01/09  . Hypothyroidism     Past Surgical History  Procedure Laterality Date  . Tonsillectomy and adenoidectomy    . Vocal cord polyps    . Thyroid needle biopsy  2002  . Appendectomy    . Cataract extraction      Bilat  . Colonoscopy w/ polypectomy  2005    Diverticulosis;Dr. Sharlett Iles  . Total hip arthroplasty  2008    CHF & RAF post op   . Total shoulder replacement  2010    Dr Durward Fortes - left shoulder  . Mohs surgery  2013    nasal Basal Cell; Dr Sarajane Jews  . Excision metacarpal mass Right 08/17/2013    Procedure: RIGHT LONG EXCISION MASS AND DIP JOINT DEBRIDEMENT;  Surgeon: Tennis Must, MD;  Location: [facility];  Service: Orthopedics;  Laterality: Right;  . Femoral hernia repair Right 08/17/2014    Procedure: OPEN REPAIR RIGHT FEMORAL HERNIA  WITH INSERTION OF MESH;  Surgeon: Adin Hector, MD;  Location: [facility];  Service: General;  Laterality: Right;  . Insertion of mesh  08/17/2014    Procedure: INSERTION OF MESH;  Surgeon: Adin Hector, MD;  Location: [facility];  Service: General;;  . Hernia repair      History   Social History  . Marital Status: Married    Spouse Name: N/A    Number of Children: N/A  . Years of  Education: N/A   Occupational History  . Not on file.   Social History Main Topics  . Smoking status: Former Smoker    Quit date: 12/23/1993  . Smokeless tobacco: Not on file     Comment: Smoked 1950-1995, up to 1 ppd  . Alcohol Use: No  . Drug Use: No  . Sexual Activity: No   Other Topics Concern  . Not on file   Social History Narrative    Family History  Problem Relation Age of Onset  . Stroke Father     > 52  . Diabetes Father   . Leukemia Mother   . Cancer Mother     leukemia  . Colon cancer Brother     Valve replacement  . Heart attack Brother   . Breast cancer Sister   . Ovarian cancer Sister   . Lung cancer Sister     smoker  . Prostate cancer Brother   . Cancer Brother     bladder    Current Facility-Administered Medications  Medication Dose Route Frequency Provider Last Rate Last Dose  . 0.9 %  sodium chloride infusion  250 mL Intravenous PRN Michael Boston, MD      . acetaminophen (TYLENOL) tablet 650 mg  650 mg Oral Q6H PRN Michael Boston, MD       Or  . acetaminophen (TYLENOL) suppository 650 mg  650 mg Rectal Q6H PRN Michael Boston, MD      . alum & mag hydroxide-simeth (MAALOX/MYLANTA) 200-200-20 MG/5ML suspension 30 mL  30 mL Oral Q6H PRN Michael Boston, MD      . bisacodyl (DULCOLAX) suppository 10 mg  10 mg Rectal Q12H PRN Michael Boston, MD      . calcium-vitamin D 500-200 MG-UNIT per tablet 1 tablet  1 tablet Oral Daily Michael Boston, MD      . digoxin (LANOXIN) tablet 0.125 mg  0.125 mg Oral QHS Michael Boston, MD      . diltiazem (CARDIZEM CD) 24 hr capsule 120 mg  120 mg Oral Daily Michael Boston, MD      . diphenhydrAMINE (BENADRYL) injection 12.5-25 mg  12.5-25 mg Intravenous Q6H PRN Michael Boston, MD       Or  . diphenhydrAMINE (BENADRYL) 12.5 MG/5ML elixir 12.5-25 mg  12.5-25 mg Oral Q6H PRN Michael Boston, MD      . diphenhydramine-acetaminophen (TYLENOL PM) 25-500 MG per tablet 2 tablet  2 tablet Oral QHS PRN Michael Boston, MD      . ENSURE PLUS  (ENSURE PLUS) liquid 1 Can  1 Can Oral Daily Michael Boston, MD      . estradiol (ESTRACE) vaginal cream 3.24 Applicatorful  2 g Vaginal Once per day on Mon Wed Fri Michael Boston, MD      . ezetimibe-simvastatin (VYTORIN) 10-40 MG per tablet 1 tablet  1 tablet  Oral Daily Michael Boston, MD      . furosemide (LASIX) injection 40 mg  40 mg Intravenous BID Michael Boston, MD      . HYDROmorphone (DILAUDID) injection 0.5-2 mg  0.5-2 mg Intravenous Q2H PRN Michael Boston, MD      . lactated ringers bolus 1,000 mL  1,000 mL Intravenous Q8H PRN Michael Boston, MD      . latanoprost (XALATAN) 0.005 % ophthalmic solution 1 drop  1 drop Both Eyes QHS Michael Boston, MD      . levothyroxine (SYNTHROID, LEVOTHROID) tablet 50 mcg  50 mcg Oral Daily Michael Boston, MD      . lip balm (CARMEX) ointment 1 application  1 application Topical BID Michael Boston, MD      . LORazepam (ATIVAN) tablet 0.5 mg  0.5 mg Oral Q8H PRN Michael Boston, MD      . magic mouthwash  15 mL Oral QID PRN Michael Boston, MD      . menthol-cetylpyridinium (CEPACOL) lozenge 3 mg  1 lozenge Oral PRN Michael Boston, MD      . metoprolol (LOPRESSOR) injection 5 mg  5 mg Intravenous Q6H PRN Michael Boston, MD      . Misc Intestinal Flora Regulat CAPS 1 capsule  1 capsule Oral Daily Michael Boston, MD      . ondansetron [facility]) injection 4 mg  4 mg Intravenous Q6H PRN Michael Boston, MD      . phenol (CHLORASEPTIC) mouth spray 2 spray  2 spray Mouth/Throat PRN Michael Boston, MD      . polyethylene glycol (MIRALAX / GLYCOLAX) packet 17 g  17 g Oral BID Michael Boston, MD      . polyethylene glycol (MIRALAX / GLYCOLAX) packet 17 g  17 g Oral Q12H PRN Michael Boston, MD      . promethazine (PHENERGAN) injection 6.25-12.5 mg  6.25-12.5 mg Intravenous Q4H PRN Michael Boston, MD      . sertraline (ZOLOFT) tablet 50 mg  50 mg Oral Daily Michael Boston, MD      . sodium chloride 0.9 % injection 3 mL  3 mL Intravenous Q12H Michael Boston, MD      . sodium chloride 0.9 % injection 3 mL  3  mL Intravenous PRN Michael Boston, MD      . timolol (TIMOPTIC) 0.5 % ophthalmic solution 1 drop  1 drop Both Eyes BID Michael Boston, MD      . traMADol Veatrice Bourbon) tablet 50-100 mg  50-100 mg Oral Q6H PRN Michael Boston, MD      . trimethoprim (TRIMPEX) tablet 100 mg  100 mg Oral Q48H Michael Boston, MD      . Vitamin B Complex TABS 1 tablet  1 tablet Oral Daily Michael Boston, MD       Current Outpatient Prescriptions  Medication Sig Dispense Refill  . acetaminophen (TYLENOL) 325 MG tablet You can take 2 tablets every 4 hours as needed.  You cannot take more than 4000 mg of tylenol per day.  This is also in your sleep aide, and it is in your prescribed pain medicine. So you need to figure out.  Do not take more than 12 tablets of tylenol (acetaminophen) per day.    Marland Kitchen apixaban (ELIQUIS) 2.5 MG TABS tablet Take 2.5 mg by mouth 2 (two) times daily.    . B Complex Vitamins (VITAMIN B COMPLEX) TABS Take 1 tablet by mouth daily.     . bimatoprost (LUMIGAN) 0.03 % ophthalmic solution Place 1  drop into both eyes at bedtime.     . calcium carbonate (TUMS - DOSED IN MG ELEMENTAL CALCIUM) 500 MG chewable tablet Chew 2 tablets by mouth daily as needed for indigestion or heartburn (indigestion).    . Calcium Carbonate-Vitamin D (CALCIUM-VITAMIN D) 500-200 MG-UNIT per tablet Take 1 tablet by mouth daily.     . Cholecalciferol (VITAMIN D) 2000 UNITS tablet Take 2,000 Units by mouth daily.    . Cranberry (CRANBERRY CONCENTRATE) 500 MG CAPS Take 2 capsules by mouth daily.      . digoxin (LANOXIN) 0.125 MG tablet Take 0.125 mg by mouth at bedtime.     Marland Kitchen diltiazem (CARDIZEM CD) 120 MG 24 hr capsule Take 120 mg by mouth daily.    . diphenhydramine-acetaminophen (TYLENOL PM) 25-500 MG TABS Take 2 tablets by mouth at bedtime as needed (sleep). Infrequently    . ENSURE PLUS (ENSURE PLUS) LIQD Take 1 Can by mouth daily. 24 Can 11  . estradiol (ESTRACE) 0.1 MG/GM vaginal cream Place 2 g vaginally. Twice weekly    . levothyroxine  (SYNTHROID, LEVOTHROID) 50 MCG tablet Take 50 mcg by mouth daily. On Weds ONLY takes 1 1/2 tabs. 90 tablet 3  . LORazepam (ATIVAN) 1 MG tablet Take 0.5 mg by mouth every 8 (eight) hours as needed for anxiety or sleep (sleep).     . Magnesium Oxide 500 MG TABS Take 1 tablet by mouth at bedtime.     . Probiotic Product (MISC INTESTINAL FLORA REGULAT) CAPS Take 1 capsule by mouth daily.      . sertraline (ZOLOFT) 50 MG tablet Take 1 tablet (50 mg total) by mouth daily. 30 tablet 3  . timolol (TIMOPTIC) 0.5 % ophthalmic solution Place 1 drop into both eyes 2 (two) times daily.     Marland Kitchen trimethoprim (TRIMPEX) 100 MG tablet Take 100 mg by mouth every other day.    Marland Kitchen VYTORIN 10-40 MG per tablet TAKE 1 TABLET ONCE DAILY. 90 tablet 1     Allergies  Allergen Reactions  . Hydrocodone Nausea And Vomiting  . Desmopressin Acetate     REACTION: swelling feet  . Dorzolamide Hcl Other (See Comments)    Burning in eyes and redness  . Levofloxacin     REACTION: INTERFERS WITH COUMADIN - but no longer taking warfarin  . Ciprofloxacin     REACTION: vaginal infection    ROS: Constitutional:  No fevers, chills, sweats.  Weight stable Eyes:  No vision changes, No discharge HENT:  No sore throats, nasal drainage Lymph: No neck swelling, No bruising easily Pulmonary:  No cough, productive sputum CV: No orthopnea, PND  Patient walks 30 minutes without difficulty.  No exertional chest/neck/shoulder/arm pain.- GI:  No personal nor family history of GI/colon cancer, inflammatory bowel disease, irritable bowel syndrome, allergy such as Celiac Sprue, dietary/dairy problems, colitis, ulcers nor gastritis.  No recent sick contacts/gastroenteritis.  No travel outside the country.  No changes in diet. Renal: No UTIs, No hematuria Genital:  No drainage, bleeding, masses Musculoskeletal: No severe joint pain.  Good ROM major joints Skin:  No sores or lesions.  No rashes Heme/Lymph:  No easy bleeding.  No swollen lymph  nodes Neuro: No focal weakness/numbness.  No seizures Psych: No suicidal ideation.  No hallucinations  BP 110/61 mmHg  Pulse 78  Temp(Src) 97.5 F (36.4 C) (Oral)  Resp 16  SpO2 92%  Physical Exam: General: Pt awake/alert/oriented x4 in no major acute distress Eyes: PERRL, normal EOM. Sclera nonicteric Neuro:  CN II-XII intact w/o focal sensory/motor deficits. Lymph: No head/neck/groin lymphadenopathy Psych:  No delerium/psychosis/paranoia HENT: Normocephalic, Mucus membranes moist.  No thrush Neck: Supple, No tracheal deviation Chest: No pain.  Good respiratory excursion.  Decreased BS at bases.  87% on 2LNC - 93% on 3LNC CV:  Pulses intact.  Irregular rhythm Abdomen: Soft, Mod distended.  Nontender.  No incarcerated hernias. GU:  Bulging in right groin concern for at least inguinal and possible femoral hernias.  Reducible.  Right lower quadrant incision well-healed.  No obvious hernias on left side. Ext:  SCDs BLE.  Trace BLE edema.  No cyanosis Skin: No petechiae / purpurea.  No major sores Musculoskeletal: No severe joint pain.  Good ROM major joints   Results:   Labs: Results for orders placed or performed during the hospital encounter of 11/10/14 (from the past 48 hour(s))  CBC with Differential     Status: None   Collection Time: 11/10/14 11:08 PM  Result Value Ref Range   WBC 6.3 4.0 - 10.5 K/uL   RBC 4.96 3.87 - 5.11 MIL/uL   Hemoglobin 14.6 12.0 - 15.0 g/dL   HCT 43.4 36.0 - 46.0 %   MCV 87.5 78.0 - 100.0 fL   MCH 29.4 26.0 - 34.0 pg   MCHC 33.6 30.0 - 36.0 g/dL   RDW 14.3 11.5 - 15.5 %   Platelets 193 150 - 400 K/uL   Neutrophils Relative % 58 43 - 77 %   Neutro Abs 3.7 1.7 - 7.7 K/uL   Lymphocytes Relative 28 12 - 46 %   Lymphs Abs 1.8 0.7 - 4.0 K/uL   Monocytes Relative 11 3 - 12 %   Monocytes Absolute 0.7 0.1 - 1.0 K/uL   Eosinophils Relative 2 0 - 5 %   Eosinophils Absolute 0.1 0.0 - 0.7 K/uL   Basophils Relative 1 0 - 1 %   Basophils Absolute 0.1  0.0 - 0.1 K/uL  Basic metabolic panel     Status: Abnormal   Collection Time: 11/10/14 11:08 PM  Result Value Ref Range   Sodium 138 137 - 147 mEq/L   Potassium 4.6 3.7 - 5.3 mEq/L   Chloride 98 96 - 112 mEq/L   CO2 26 19 - 32 mEq/L   Glucose, Bld 94 70 - 99 mg/dL   BUN 14 6 - 23 mg/dL   Creatinine, Ser 0.94 0.50 - 1.10 mg/dL   Calcium 9.9 8.4 - 10.5 mg/dL   GFR calc non Af Amer 54 (L) >90 mL/min   GFR calc Af Amer 62 (L) >90 mL/min    Comment: (NOTE) The eGFR has been calculated using the CKD EPI equation. This calculation has not been validated in all clinical situations. eGFR's persistently <90 mL/min signify possible Chronic Kidney Disease.    Anion gap 14 5 - 15  Urinalysis, Routine w reflex microscopic     Status: Abnormal   Collection Time: 11/10/14 11:12 PM  Result Value Ref Range   Color, Urine YELLOW YELLOW   APPearance CLOUDY (A) CLEAR   Specific Gravity, Urine 1.012 1.005 - 1.030   pH 7.0 5.0 - 8.0   Glucose, UA NEGATIVE NEGATIVE mg/dL   Hgb urine dipstick NEGATIVE NEGATIVE   Bilirubin Urine NEGATIVE NEGATIVE   Ketones, ur NEGATIVE NEGATIVE mg/dL   Protein, ur NEGATIVE NEGATIVE mg/dL   Urobilinogen, UA 0.2 0.0 - 1.0 mg/dL   Nitrite NEGATIVE NEGATIVE   Leukocytes, UA NEGATIVE NEGATIVE    Comment: MICROSCOPIC NOT DONE ON  URINES WITH NEGATIVE PROTEIN, BLOOD, LEUKOCYTES, NITRITE, OR GLUCOSE <1000 mg/dL.    Imaging / Studies: Ct Abdomen Pelvis W Contrast  11/11/2014   CLINICAL DATA:  Right lower quadrant pain for 1 day  EXAM: CT ABDOMEN AND PELVIS WITH CONTRAST  TECHNIQUE: Multidetector CT imaging of the abdomen and pelvis was performed using the standard protocol following bolus administration of intravenous contrast.  CONTRAST:  138m OMNIPAQUE IOHEXOL 300 MG/ML SOLN, 567mOMNIPAQUE IOHEXOL 300 MG/ML SOLN  COMPARISON:  08/14/2014  FINDINGS: Lung bases demonstrate some fibrotic changes stable from the previous exam. No acute infiltrate is noted.  The liver,  gallbladder, spleen, adrenal glands and pancreas are stable in appearance from the prior exam. Multiple calcified splenic granulomas are noted. Kidneys are well visualized and demonstrate a normal enhancement pattern. Delayed images through the kidneys demonstrate normal excretion of contrast. No calculi or obstructive changes are noted.  The appendix is not visualize consistent with the patient's clinical history. Diverticulosis without diverticulitis is noted. Mild small bowel obstructive changes are noted to secondary to an incarcerated bowel loop within a right inguinal hernia. The overall appearance is similar to that seen on the prior exam although the patient gives a history of recent hernia repair. No free air is seen. No perforation is noted. Diffuse aortoiliac calcifications are seen. No acute bony abnormality is noted. Multilevel degenerative changes of the lumbar spine are seen. A right hip replacement is noted.  IMPRESSION: Changes consistent with a recurrent right inguinal hernia despite previous surgical repair. There is incarceration of a loop of small bowel within the hernia with resultant small bowel obstruction. The overall appearance is similar to that seen in August of 2015.   Electronically Signed   By: MaInez Catalina.D.   On: 11/11/2014 00:37    Medications / Allergies: per chart  Antibiotics: Anti-infectives    Start     Dose/Rate Route Frequency Ordered Stop   11/11/14 0215  trimethoprim (TRIMPEX) tablet 100 mg     100 mg Oral Every 48 hours 11/11/14 0214        Assessment  BeWerner Lean[age].o. female       Problem List:  Principal Problem:   Recurrent femoral hernia with obstruction without gangrene Active Problems:   Hypothyroidism   Atrial fibrillation   Chronic anticoagulation   History of CVA (cerebrovascular accident)   Recurrent inguinal hernia with obstruction   Current groin bulging strongly suspicious for recurrent hernia based on history physical  and CT scan.  Plan:  Admit  Diuresis for probable hypoxemia.  Follow electrolytes.  Check CXR.  Possible medical & cardiac reconsultation.  Not much needed to be done at the time of last admission.  No major changes since last admission.  Hold anticoagulation.  With apixaban, plan surgery when >48hours.  She took her last dose tonight just prior to incarceration episode.  Therefore could not do it until Saturday evening/Sunday morning:  Apixaban - Apixaban is a direct factor Xa inhibitor; it reversibly blocks the enzymatic function of factor Xa in converting prothrombin to thrombin. ?Discontinuation - Apixaban can be discontinued approximately two to three days before a procedure, with the longer interval for higher bleeding risk procedures and the shorter interval for lower bleeding risk procedures (table 2). Thus, for high bleeding risk procedures, the patient will skip four doses of apixaban, and not receive any doses on surgical days minus 2, minus 1, or the day of surgery. These intervals are based on  the apixaban elimination half-life of 8 to 15 hours. These intervals apply to individuals with normal renal function or mild renal insufficiency (eg, creatinine clearance >50 mL/minute), who are likely to be receiving the 5 mg twice daily dose; and to those with moderate renal insufficiency (eg, creatinine clearance between 30 and 50 mL/minute), who are likely to be receiving the 2.5 mg twice daily dose. Longer intervals for interruption may be considered for those undergoing major surgery, neuraxial anesthesia or manipulation, or other situations in which complete hemostatic function may be required. (See 'Neuraxial anesthesia' below.) Unlike the PT/INR for warfarin, routine coagulation tests have not been validated for ensuring that apixaban effect has resolved. A normal or near-normal anti-factor Xa activity level may be used in selected patients to evaluate whether apixaban has been adequately  cleared from the circulation prior to surgery (eg, patients at high risk of surgical bleeding) (table 7). Of note, the reliability of anti-factor Xa activity testing may depend on the specific assay used, and clinicians are advised to speak with their clinical laboratory to determine whether this assay is available at their institution and whether it has been validated for direct factor Xa inhibitors.  ?Use of bridging - In general, the rapid offset and onset of apixaban obviates the need for bridging anticoagulation. In rare cases, bridging may be required, such as the use of postoperative bridging in individuals who have a very high thromboembolic risk and are unable to take oral medications postoperatively due to intestinal ileus from gastrointestinal surgery. (See 'Bridging anticoagulation' below.) ?Restarting apixaban - Apixaban can be resumed postoperatively when hemostasis has been achieved, at the same dose the patient was receiving preoperatively. Since apixaban has a rapid onset of action, caution should be used in patients who have had major surgery or other procedures associated with a high bleeding risk.  We often delay apixaban for two to three days after high bleeding risk procedures, and if needed use prophylactic dose LMW heparin for this period. We generally restart apixaban one day after low bleeding risk surgery (if it was interrupted).    Once anticoagulation held greater than 48 hours, consider repair of hernia.  Would consider different (laparoscopic) approach given failure after prior open repair wih mesh by experienced surgeon.  We will defer to surgeons available.  There are perioperative risks, but I feel the risk of doing nothing is very high for incarceration especially given such soon recurrence.  I had a long discussion with the patient and her husband.  She is understandably concerned about risks.  However I think she would tolerate surgery since she has good functionality  independence and quality of life.  Not surprising that it took 6 weeks to more fully recover after episode of hypoxia and needing urgent surgery, but I am hopeful that because she got back to normal quality of life she could still do that.  While not excited about another operation, she does wish to be aggressive to maintain a decent quality of life:  The anatomy & physiology of the abdominal wall and pelvic floor was discussed.  The pathophysiology of hernias in the inguinal and pelvic region was discussed.  Natural history risks such as progressive enlargement, pain, incarceration, and strangulation was discussed.   Contributors to complications such as smoking, obesity, diabetes, prior surgery, etc were discussed.    I feel the risks of no intervention will lead to serious problems that outweigh the operative risks; therefore, I recommended surgery to reduce and repair the hernia.  I explained laparoscopic techniques with possible need for an open approach.  I noted usual use of mesh to patch and/or buttress hernia repair  Risks such as bleeding, infection, abscess, need for further treatment, stroke, heart attack, death, and other risks were discussed.  I noted a good likelihood this will help address the problem.   Goals of post-operative recovery were discussed as well.  Possibility that this will not correct all symptoms was explained.  I stressed the importance of low-impact activity, aggressive pain control, avoiding constipation, & not pushing through pain to minimize risk of post-operative chronic pain or injury. Possibility of reherniation was discussed.  We will work to minimize complications.     An educational handout further explaining the pathology & treatment options was given as well.  Questions were answered.  The patient & her husband expressed understanding & wish to proceed with surgery.   -VTE prophylaxis- SCDs, etc -mobilize as tolerated to help recovery.  Fall  precautions -Resume rate control with Cardizem and digoxin.  Control hypothyroidism.  Control anxiety and depression.      Adin Hector, M.D., F.A.C.S. Gastrointestinal and Minimally Invasive Surgery [facility] [address] [place],  [address] [phone] Main / Paging   11/11/2014  Note: Portions of this report may have been transcribed using voice recognition software. Every effort was made to ensure accuracy; however, inadvertent computerized transcription errors may be present.   Any transcriptional errors that result from this process are unintentional.

## 2014-11-11 NOTE — Plan of Care (Signed)
Problem: Phase I Progression Outcomes Goal: Pain controlled with appropriate interventions Outcome: Completed/Met Date Met:  11/11/14 Goal: OOB as tolerated unless otherwise ordered Outcome: Completed/Met Date Met:  11/11/14 Goal: Voiding-avoid urinary catheter unless indicated Outcome: Completed/Met Date Met:  11/11/14

## 2014-11-11 NOTE — Progress Notes (Addendum)
Subjective: Sitting up and eating full liquid diet.  No pain or discomfort this Am.  Objective: Vital signs in last 24 hours: Temp:  [97.5 F (36.4 C)-97.6 F (36.4 C)] 97.6 F (36.4 C) (11/20 1013) Pulse Rate:  [67-81] 77 (11/20 1013) Resp:  [16-18] 18 (11/20 1013) BP: (97-178)/(55-88) 101/56 mmHg (11/20 1013) SpO2:  [90 %-94 %] 94 % (11/20 1013) Weight:  [57.199 kg (126 lb 1.6 oz)] 57.199 kg (126 lb 1.6 oz) (11/20 0328) Last BM Date: 11/10/14  Intake/Output from previous day:   Intake/Output this shift: Total I/O In: 243 [P.O.:240; I.V.:3] Out: -   General appearance: alert, cooperative and no distress GI: soft, non-tender; bowel sounds normal; no masses,  no organomegaly  Lab Results:   Recent Labs  11/10/14 2308  WBC 6.3  HGB 14.6  HCT 43.4  PLT 193    BMET  Recent Labs  11/10/14 2308  NA 138  K 4.6  CL 98  CO2 26  GLUCOSE 94  BUN 14  CREATININE 0.94  CALCIUM 9.9   PT/INR No results for input(s): LABPROT, INR in the last 72 hours.  No results for input(s): AST, ALT, ALKPHOS, BILITOT, PROT, ALBUMIN in the last 168 hours.   Lipase     Component Value Date/Time   LIPASE 29 08/14/2014 1912     Studies/Results: Ct Abdomen Pelvis W Contrast  11/11/2014   CLINICAL DATA:  Right lower quadrant pain for 1 day  EXAM: CT ABDOMEN AND PELVIS WITH CONTRAST  TECHNIQUE: Multidetector CT imaging of the abdomen and pelvis was performed using the standard protocol following bolus administration of intravenous contrast.  CONTRAST:  142mL OMNIPAQUE IOHEXOL 300 MG/ML SOLN, 24mL OMNIPAQUE IOHEXOL 300 MG/ML SOLN  COMPARISON:  08/14/2014  FINDINGS: Lung bases demonstrate some fibrotic changes stable from the previous exam. No acute infiltrate is noted.  The liver, gallbladder, spleen, adrenal glands and pancreas are stable in appearance from the prior exam. Multiple calcified splenic granulomas are noted. Kidneys are well visualized and demonstrate a normal enhancement  pattern. Delayed images through the kidneys demonstrate normal excretion of contrast. No calculi or obstructive changes are noted.  The appendix is not visualize consistent with the patient's clinical history. Diverticulosis without diverticulitis is noted. Mild small bowel obstructive changes are noted to secondary to an incarcerated bowel loop within a right inguinal hernia. The overall appearance is similar to that seen on the prior exam although the patient gives a history of recent hernia repair. No free air is seen. No perforation is noted. Diffuse aortoiliac calcifications are seen. No acute bony abnormality is noted. Multilevel degenerative changes of the lumbar spine are seen. A right hip replacement is noted.  IMPRESSION: Changes consistent with a recurrent right inguinal hernia despite previous surgical repair. There is incarceration of a loop of small bowel within the hernia with resultant small bowel obstruction. The overall appearance is similar to that seen in August of 2015.   Electronically Signed   By: Inez Catalina M.D.   On: 11/11/2014 00:37    Medications: . atorvastatin  20 mg Oral q1800  . B-complex with vitamin C  1 tablet Oral Daily  . calcium-vitamin D  1 tablet Oral Daily  . digoxin  0.125 mg Oral QHS  . diltiazem  120 mg Oral Daily  . estradiol  2 g Vaginal Once per day on Mon Wed Fri  . ezetimibe  10 mg Oral Daily  . feeding supplement (ENSURE COMPLETE)  237 mL Oral Daily  .  FLORA-Q  1 capsule Oral Daily  . furosemide  40 mg Intravenous BID  . latanoprost  1 drop Both Eyes QHS  . levothyroxine  50 mcg Oral Once per day on Sun Mon Tue Thu Fri Sat  . [START ON 11/16/2014] levothyroxine  75 mcg Oral Once per day on Wed  . lip balm  1 application Topical BID  . polyethylene glycol  17 g Oral BID  . sertraline  50 mg Oral Daily  . sodium chloride  3 mL Intravenous Q12H  . timolol  1 drop Both Eyes BID  . trimethoprim  100 mg Oral Q48H    Assessment/Plan Recurrent  femoral/inguinal hernia with obstruction without gangrene. Open repair of right femoral hernia with Mesh 08/17/14  Dr. Dalbert Batman Atrial fibrillation on Eliquis, last dose PM 11/10/14. Hx of CVA Hypothyroid Blind OS Hyperlipidemia Prior hip and shoulder replacements   Plan:  Hold the Eliquis for the next 48-72 hours, and then plan to repair the recurrent femoral hernia.  Soft diet for now.          LOS: 1 day    Kristine Simmons 11/11/2014

## 2014-11-11 NOTE — Progress Notes (Signed)
Utilization review completed.  

## 2014-11-12 ENCOUNTER — Inpatient Hospital Stay (HOSPITAL_COMMUNITY): Payer: Medicare Other

## 2014-11-12 LAB — BASIC METABOLIC PANEL
Anion gap: 8 (ref 5–15)
BUN: 10 mg/dL (ref 6–23)
CALCIUM: 8.8 mg/dL (ref 8.4–10.5)
CO2: 32 mEq/L (ref 19–32)
Chloride: 96 mEq/L (ref 96–112)
Creatinine, Ser: 0.93 mg/dL (ref 0.50–1.10)
GFR calc Af Amer: 63 mL/min — ABNORMAL LOW (ref >90)
GFR, EST NON AFRICAN AMERICAN: 54 mL/min — AB (ref >90)
Glucose, Bld: 86 mg/dL (ref 70–99)
Potassium: 4.7 mEq/L (ref 3.7–5.3)
SODIUM: 136 meq/L — AB (ref 137–147)

## 2014-11-12 LAB — URINE CULTURE
COLONY COUNT: NO GROWTH
CULTURE: NO GROWTH

## 2014-11-12 NOTE — Progress Notes (Signed)
Patient ID: Kristine Simmons, female   DOB: 13-Feb-1927, 80 y.o.   MRN: [id number]  General Surgery - [facility] - Progress Note  Subjective: Pleasant lady with no complaints this AM.  Slept well. Awaiting recurrent hernia repair.  On soft diet.  Objective: Vital signs in last 24 hours: Temp:  [97.6 F (36.4 C)-98.4 F (36.9 C)] 98.4 F (36.9 C) (11/21 0428) Pulse Rate:  [56-92] 88 (11/21 0428) Resp:  [17-18] 17 (11/21 0428) BP: (91-145)/(52-90) 145/90 mmHg (11/21 0428) SpO2:  [90 %-94 %] 91 % (11/21 0428) Last BM Date: 11/10/14  Intake/Output from previous day: 11/20 0701 - 11/21 0700 In: 1086 [P.O.:1080; I.V.:6] Out: -   Exam: HEENT - clear, not icteric Neck - soft Chest - clear bilaterally Cor - rate controlled Abd - soft, non-tender; no evidence hernia right groin this AM while recumbent Ext - no significant edema Neuro - grossly intact, no focal deficits  Lab Results:   Recent Labs  11/10/14 2308  WBC 6.3  HGB 14.6  HCT 43.4  PLT 193     Recent Labs  11/10/14 2308 11/12/14 0419  NA 138 136*  K 4.6 4.7  CL 98 96  CO2 26 32  GLUCOSE 94 86  BUN 14 10  CREATININE 0.94 0.93  CALCIUM 9.9 8.8    Studies/Results: Ct Abdomen Pelvis W Contrast  11/11/2014   CLINICAL DATA:  Right lower quadrant pain for 1 day  EXAM: CT ABDOMEN AND PELVIS WITH CONTRAST  TECHNIQUE: Multidetector CT imaging of the abdomen and pelvis was performed using the standard protocol following bolus administration of intravenous contrast.  CONTRAST:  17mL OMNIPAQUE IOHEXOL 300 MG/ML SOLN, 75mL OMNIPAQUE IOHEXOL 300 MG/ML SOLN  COMPARISON:  08/14/2014  FINDINGS: Lung bases demonstrate some fibrotic changes stable from the previous exam. No acute infiltrate is noted.  The liver, gallbladder, spleen, adrenal glands and pancreas are stable in appearance from the prior exam. Multiple calcified splenic granulomas are noted. Kidneys are well visualized and demonstrate a normal  enhancement pattern. Delayed images through the kidneys demonstrate normal excretion of contrast. No calculi or obstructive changes are noted.  The appendix is not visualize consistent with the patient's clinical history. Diverticulosis without diverticulitis is noted. Mild small bowel obstructive changes are noted to secondary to an incarcerated bowel loop within a right inguinal hernia. The overall appearance is similar to that seen on the prior exam although the patient gives a history of recent hernia repair. No free air is seen. No perforation is noted. Diffuse aortoiliac calcifications are seen. No acute bony abnormality is noted. Multilevel degenerative changes of the lumbar spine are seen. A right hip replacement is noted.  IMPRESSION: Changes consistent with a recurrent right inguinal hernia despite previous surgical repair. There is incarceration of a loop of small bowel within the hernia with resultant small bowel obstruction. The overall appearance is similar to that seen in August of 2015.   Electronically Signed   By: Inez Catalina M.D.   On: 11/11/2014 00:37    Assessment / Plan: 1.  Recurrent right femoral hernia  Hold anticoagulation  Allow diet today and tomorrow  Anticipate operative repair on Monday, 11/14/2014, by Dr. Alphonsa Overall  Earnstine Regal, MD, [facility] Office: [phone]  11/12/2014

## 2014-11-12 NOTE — Plan of Care (Signed)
Problem: Phase I Progression Outcomes Goal: Hemodynamically stable Outcome: Completed/Met Date Met:  11/12/14     

## 2014-11-12 NOTE — Progress Notes (Signed)
Chaplain visit on referral from her church. Kristine Simmons is anticipating her procedure to repair her hernia. She is upbeat and displays a great sense of hopefulness. He husband Kristine Simmons is supportive and is at the bedside.  Kristine Simmons is a practicing [employer] Meigs of the [employer] denomination. Her decisions are based in large part on her faith stands.  Prayer and comfort was offered.  Sallee Lange. Santi Troung, [place]

## 2014-11-13 MED ORDER — KCL IN DEXTROSE-NACL 20-5-0.45 MEQ/L-%-% IV SOLN
INTRAVENOUS | Status: DC
  Administered 2014-11-13: 50 mL/h via INTRAVENOUS
  Administered 2014-11-14 – 2014-11-15 (×2): via INTRAVENOUS
  Administered 2014-11-17: 50 mL via INTRAVENOUS
  Filled 2014-11-13 (×8): qty 1000

## 2014-11-13 MED ORDER — CEFAZOLIN SODIUM-DEXTROSE 2-3 GM-% IV SOLR: 2.0000 g | INTRAVENOUS | Status: AC

## 2014-11-13 NOTE — Progress Notes (Signed)
Chaplain follow up to earlier visit. Ms Kristine Simmons is anticipating her medical procedure on Monday and has questions she would like answered to ease her anxiety. She is a deeply spiritual person who does best medically when she is at peace with what is happening to her. Her questions were passed on to nursing staff.  Request follow up chaplain care prior to procedure on Monday,   Kristine Freeman D. Brian Kocourek, [place]

## 2014-11-13 NOTE — Progress Notes (Signed)
Patient ID: Kristine Simmons, female   DOB: 29-Oct-1927, 80 y.o.   MRN: [id number]  [facility] - Progress Note  Subjective: Patient without complaint.  No BM, but passing large flatus.  No pain.  No nausea.  Hernia has not recurred since reduction.  Objective: Vital signs in last 24 hours: Temp:  [97.6 F (36.4 C)-98.7 F (37.1 C)] 98.7 F (37.1 C) (11/22 0601) Pulse Rate:  [75-86] 75 (11/22 0601) Resp:  [15-16] 15 (11/22 0601) BP: (105-122)/(56-64) 118/56 mmHg (11/22 0601) SpO2:  [93 %-98 %] 98 % (11/22 0601) Last BM Date: 11/10/14  Intake/Output from previous day: 11/21 0701 - 11/22 0700 In: 963 [P.O.:960; I.V.:3] Out: 1425 [Urine:1425]  Exam: HEENT - clear, not icteric Neck - soft Chest - clear bilaterally Cor - rate controlled, irregular, gr II systolic murmur Abd - soft without distension Ext - no significant edema Neuro - grossly intact, no focal deficits  Lab Results:   Recent Labs  11/10/14 2308  WBC 6.3  HGB 14.6  HCT 43.4  PLT 193     Recent Labs  11/10/14 2308 11/12/14 0419  NA 138 136*  K 4.6 4.7  CL 98 96  CO2 26 32  GLUCOSE 94 86  BUN 14 10  CREATININE 0.94 0.93  CALCIUM 9.9 8.8    Studies/Results: Dg Abd Acute W/chest  11/12/2014   CLINICAL DATA:  Hypoxemia, small bowel obstruction, and COPD. Hernia repair surgery 6 weeks ago.  EXAM: ACUTE ABDOMEN SERIES (ABDOMEN 2 VIEW & CHEST 1 VIEW)  COMPARISON:  CT abdomen and pelvis 11/11/2014. Chest radiograph 08/19/2014. Abdominal radiographs 08/15/2014.  FINDINGS: Cardiac silhouette remains mildly enlarged. Thoracic aortic calcification is again seen. Lungs remain hyperinflated with chronic coarsening of the interstitial markings consistent with underlying COPD. There is improved aeration of the lung bases compared to the prior chest radiograph. Small left pleural effusion is suspected as seen on recent abdominal CT. No new confluent airspace opacity, pulmonary  edema, or pneumothorax is identified. Prior left shoulder arthroplasty is again noted.  No intraperitoneal free air is identified. Residual oral contrast is present in the colon. There is a relative paucity of small bowel gas, limiting evaluation for ongoing small bowel obstruction. No significant bowel air-fluid levels are identified. Gas and stool are present in the distal colon and rectum. Prior right hip arthroplasty is noted.  IMPRESSION: 1. No evidence of acute cardiopulmonary disease.  COPD. 2. Paucity of small bowel gas without dilated small bowel loops identified. Oral contrast has progressed into the colon.   Electronically Signed   By: Logan Bores   On: 11/12/2014 10:03    Assessment / Plan: 1.  Recurrent RIH with SBO, reduced  Previous "Cooper's ligament repair of right inguinal and right femoral hernia with UltraPro mesh" performed by Dr. Dalbert Batman on 08-17-2014.  Now with recurrence.  Patient admitted to hold anticoagulation (Eliquis).  Should be ready for OR in AM 11/23.  Orders entered for Dr. Alphonsa Overall (LDOW).  Discussed with patient.  Earnstine Regal, MD, [facility] Office: [phone]  11/13/2014

## 2014-11-14 LAB — SURGICAL PCR SCREEN
MRSA, PCR: NEGATIVE
STAPHYLOCOCCUS AUREUS: NEGATIVE

## 2014-11-14 NOTE — Progress Notes (Signed)
General Surgery Note  LOS: 4 days  POD -     PCP - Dr. Linna Darner Cards - Dr. Vita Barley  Assessment/Plan: 1.  Recurrent right femoral hernia with SBO. The hernia has been reduced and her SBO resolved.  Discussed with Dr. Dalbert Batman.  Plan repair later today (if time permits)  I discussed the indications and complications of hernia surgery with the patient.  I discussed both the laparoscopic and open approach to hernia repair.  The potential risks of hernia surgery include, but are not limited to, bleeding, infection, open surgery, nerve injury, and recurrence of the hernia.    2.  Anticoagulated  Off Eliquis since Thursday, 11/10/2104  3.  A. Fib. 4.  History of CVA 5.  Left eve vision loss 6.  COPD - has been on and off O2  7.  DVT prophylaxis - on hold for surgery  Principal Problem:   Recurrent inguinal hernia with obstruction Active Problems:   Hypothyroidism   Atrial fibrillation   Chronic anticoagulation   Recurrent femoral hernia with obstruction without gangrene   History of CVA (cerebrovascular accident)   Hypoxemia  Subjective:  Feels okay.  Does not want to go home and come back for surgery.  She wants to go ahead with surgery now.  Objective:   Filed Vitals:   11/14/14 0455  BP: 139/77  Pulse: 84  Temp: 97.7 F (36.5 C)  Resp: 18     Intake/Output from previous day:  11/22 0701 - 11/23 0700 In: 840 [P.O.:840] Out: 1000 [Urine:1000]  Intake/Output this shift:      Physical Exam:   General: Thin older WF who is alert and oriented.    HEENT: Normal. Pupils equal. .   Lungs: Clear   Abdomen: Soft.  BS present.  Right groin scar well healed.     Lab Results:   No results for input(s): WBC, HGB, HCT, PLT in the last 72 hours.  BMET   Recent Labs  11/12/14 0419  NA 136*  K 4.7  CL 96  CO2 32  GLUCOSE 86  BUN 10  CREATININE 0.93  CALCIUM 8.8    PT/INR  No results for input(s): LABPROT, INR in the last 72 hours.  ABG  No results for input(s):  PHART, HCO3 in the last 72 hours.  Invalid input(s): PCO2, PO2   Studies/Results:  Dg Abd Acute W/chest  11/12/2014   CLINICAL DATA:  Hypoxemia, small bowel obstruction, and COPD. Hernia repair surgery 6 weeks ago.  EXAM: ACUTE ABDOMEN SERIES (ABDOMEN 2 VIEW & CHEST 1 VIEW)  COMPARISON:  CT abdomen and pelvis 11/11/2014. Chest radiograph 08/19/2014. Abdominal radiographs 08/15/2014.  FINDINGS: Cardiac silhouette remains mildly enlarged. Thoracic aortic calcification is again seen. Lungs remain hyperinflated with chronic coarsening of the interstitial markings consistent with underlying COPD. There is improved aeration of the lung bases compared to the prior chest radiograph. Small left pleural effusion is suspected as seen on recent abdominal CT. No new confluent airspace opacity, pulmonary edema, or pneumothorax is identified. Prior left shoulder arthroplasty is again noted.  No intraperitoneal free air is identified. Residual oral contrast is present in the colon. There is a relative paucity of small bowel gas, limiting evaluation for ongoing small bowel obstruction. No significant bowel air-fluid levels are identified. Gas and stool are present in the distal colon and rectum. Prior right hip arthroplasty is noted.  IMPRESSION: 1. No evidence of acute cardiopulmonary disease.  COPD. 2. Paucity of small bowel gas without dilated  small bowel loops identified. Oral contrast has progressed into the colon.   Electronically Signed   By: Logan Bores   On: 11/12/2014 10:03     Anti-infectives:   Anti-infectives    Start     Dose/Rate Route Frequency Ordered Stop   11/13/14 0800  ceFAZolin (ANCEF) IVPB 2 g/50 mL premix     2 g100 mL/hr over 30 Minutes Intravenous On call to O.R. 11/13/14 0736 11/14/14 0559   11/11/14 1000  trimethoprim (TRIMPEX) tablet 100 mg     100 mg Oral Every 48 hours 11/11/14 0214        Alphonsa Overall, MD, FACS Pager: [facility] Office:  [phone] 11/14/2014

## 2014-11-14 NOTE — Plan of Care (Signed)
Problem: Phase II Progression Outcomes Goal: Vital signs remain stable Outcome: Completed/Met Date Met:  11/14/14

## 2014-11-15 ENCOUNTER — Inpatient Hospital Stay (HOSPITAL_COMMUNITY): Payer: Medicare Other | Admitting: Anesthesiology

## 2014-11-15 ENCOUNTER — Encounter (HOSPITAL_COMMUNITY): Admission: EM | Disposition: A | Discharge status: Home / Self Care | Payer: Self-pay | Source: Home / Self Care

## 2014-11-15 HISTORY — PX: INGUINAL HERNIA REPAIR: SHX194

## 2014-11-15 SURGERY — REPAIR, HERNIA, INGUINAL, LAPAROSCOPIC
Anesthesia: General | Laterality: Right

## 2014-11-15 MED ORDER — NEOSTIGMINE METHYLSULFATE 10 MG/10ML IV SOLN
INTRAVENOUS | Status: AC
  Filled 2014-11-15: qty 1

## 2014-11-15 MED ORDER — ONDANSETRON HCL 4 MG/2ML IJ SOLN
INTRAMUSCULAR | Status: DC | PRN
  Administered 2014-11-15: 4 mg via INTRAVENOUS

## 2014-11-15 MED ORDER — CEFAZOLIN SODIUM-DEXTROSE 2-3 GM-% IV SOLR
2.0000 g | Freq: Once | INTRAVENOUS | Status: AC
  Administered 2014-11-15: 2 g via INTRAVENOUS

## 2014-11-15 MED ORDER — FENTANYL CITRATE 0.05 MG/ML IJ SOLN
INTRAMUSCULAR | Status: AC
  Filled 2014-11-15: qty 2

## 2014-11-15 MED ORDER — GLYCOPYRROLATE 0.2 MG/ML IJ SOLN
INTRAMUSCULAR | Status: AC
  Filled 2014-11-15: qty 2

## 2014-11-15 MED ORDER — FENTANYL CITRATE 0.05 MG/ML IJ SOLN
25.0000 ug | INTRAMUSCULAR | Status: DC | PRN
  Administered 2014-11-15 (×2): 25 ug via INTRAVENOUS

## 2014-11-15 MED ORDER — GLYCOPYRROLATE 0.2 MG/ML IJ SOLN
INTRAMUSCULAR | Status: DC | PRN
  Administered 2014-11-15: 0.4 mg via INTRAVENOUS

## 2014-11-15 MED ORDER — NEOSTIGMINE METHYLSULFATE 10 MG/10ML IV SOLN
INTRAVENOUS | Status: DC | PRN
  Administered 2014-11-15: 3 mg via INTRAVENOUS

## 2014-11-15 MED ORDER — CEFAZOLIN SODIUM-DEXTROSE 2-3 GM-% IV SOLR
INTRAVENOUS | Status: AC
  Filled 2014-11-15: qty 50

## 2014-11-15 MED ORDER — CISATRACURIUM BESYLATE (PF) 10 MG/5ML IV SOLN
INTRAVENOUS | Status: DC | PRN
  Administered 2014-11-15: 2 mg via INTRAVENOUS
  Administered 2014-11-15: 6 mg via INTRAVENOUS

## 2014-11-15 MED ORDER — PROPOFOL 10 MG/ML IV BOLUS
INTRAVENOUS | Status: DC | PRN
  Administered 2014-11-15: 110 mg via INTRAVENOUS

## 2014-11-15 MED ORDER — BUPIVACAINE HCL (PF) 0.25 % IJ SOLN
INTRAMUSCULAR | Status: DC | PRN
  Administered 2014-11-15: 20 mL

## 2014-11-15 MED ORDER — MORPHINE SULFATE 2 MG/ML IJ SOLN: 1.0000 mg | INTRAMUSCULAR | Status: DC | PRN

## 2014-11-15 MED ORDER — LABETALOL HCL 5 MG/ML IV SOLN
INTRAVENOUS | Status: AC
  Filled 2014-11-15: qty 4

## 2014-11-15 MED ORDER — LACTATED RINGERS IV SOLN
INTRAVENOUS | Status: DC | PRN
  Administered 2014-11-15 (×2): via INTRAVENOUS

## 2014-11-15 MED ORDER — ONDANSETRON HCL 4 MG/2ML IJ SOLN
INTRAMUSCULAR | Status: AC
  Filled 2014-11-15: qty 2

## 2014-11-15 MED ORDER — FENTANYL CITRATE 0.05 MG/ML IJ SOLN
INTRAMUSCULAR | Status: DC | PRN
  Administered 2014-11-15 (×2): 50 ug via INTRAVENOUS

## 2014-11-15 MED ORDER — LABETALOL HCL 5 MG/ML IV SOLN
INTRAVENOUS | Status: DC | PRN
  Administered 2014-11-15: 2.5 mg via INTRAVENOUS

## 2014-11-15 MED ORDER — MENTHOL 3 MG MT LOZG: 1.0000 | LOZENGE | OROMUCOSAL | Status: DC | PRN

## 2014-11-15 MED ORDER — BUPIVACAINE HCL (PF) 0.25 % IJ SOLN
INTRAMUSCULAR | Status: AC
  Filled 2014-11-15: qty 30

## 2014-11-15 MED ORDER — 0.9 % SODIUM CHLORIDE (POUR BTL) OPTIME
TOPICAL | Status: DC | PRN
  Administered 2014-11-15: 1000 mL

## 2014-11-15 MED ORDER — PROPOFOL 10 MG/ML IV BOLUS
INTRAVENOUS | Status: AC
  Filled 2014-11-15: qty 20

## 2014-11-15 MED ORDER — OXYCODONE-ACETAMINOPHEN 5-325 MG PO TABS
1.0000 | ORAL_TABLET | ORAL | Status: DC | PRN
  Administered 2014-11-15: 1 via ORAL
  Filled 2014-11-15: qty 1

## 2014-11-15 SURGICAL SUPPLY — 38 items
BENZOIN TINCTURE PRP APPL 2/3 (GAUZE/BANDAGES/DRESSINGS) ×3 IMPLANT
CHLORAPREP W/TINT 26ML (MISCELLANEOUS) ×3 IMPLANT
CLOSURE WOUND 1/2 X4 (GAUZE/BANDAGES/DRESSINGS) ×1
DECANTER SPIKE VIAL GLASS SM (MISCELLANEOUS) ×3 IMPLANT
DERMABOND ADVANCED (GAUZE/BANDAGES/DRESSINGS) ×2
DERMABOND ADVANCED .7 DNX12 (GAUZE/BANDAGES/DRESSINGS) ×1 IMPLANT
DISSECT BALLN SPACEMKR + OVL (BALLOONS) ×3
DISSECTOR BALLN SPACEMKR + OVL (BALLOONS) ×1 IMPLANT
DISSECTOR BLUNT TIP ENDO 5MM (MISCELLANEOUS) IMPLANT
DRAPE LAPAROSCOPIC ABDOMINAL (DRAPES) ×3 IMPLANT
ELECT REM PT RETURN 9FT ADLT (ELECTROSURGICAL) ×3
ELECTRODE REM PT RTRN 9FT ADLT (ELECTROSURGICAL) ×1 IMPLANT
GLOVE BIOGEL PI IND STRL 7.0 (GLOVE) ×1 IMPLANT
GLOVE BIOGEL PI INDICATOR 7.0 (GLOVE) ×2
GLOVE SURG SIGNA 7.5 PF LTX (GLOVE) ×3 IMPLANT
GOWN SPEC L4 XLG W/TWL (GOWN DISPOSABLE) ×3 IMPLANT
GOWN STRL REUS W/ TWL XL LVL3 (GOWN DISPOSABLE) ×3 IMPLANT
GOWN STRL REUS W/TWL LRG LVL3 (GOWN DISPOSABLE) ×3 IMPLANT
GOWN STRL REUS W/TWL XL LVL3 (GOWN DISPOSABLE) ×6
KIT BASIN OR (CUSTOM PROCEDURE TRAY) ×3 IMPLANT
LIQUID BAND (GAUZE/BANDAGES/DRESSINGS) IMPLANT
MESH 3DMAX LIGHT 4.1X6.2 RT LR (Mesh General) ×3 IMPLANT
NEEDLE INSUFFLATION 14GA 120MM (NEEDLE) IMPLANT
SET IRRIG TUBING LAPAROSCOPIC (IRRIGATION / IRRIGATOR) IMPLANT
SLEEVE ADV FIXATION 5X100MM (TROCAR) ×3 IMPLANT
SOLUTION ANTI FOG 6CC (MISCELLANEOUS) ×3 IMPLANT
STRIP CLOSURE SKIN 1/2X4 (GAUZE/BANDAGES/DRESSINGS) ×2 IMPLANT
SUT MON AB 5-0 PS2 18 (SUTURE) ×6 IMPLANT
SUT VIC AB 3-0 SH 18 (SUTURE) ×3 IMPLANT
SUT VIC AB 5-0 PS2 18 (SUTURE) IMPLANT
SUT VICRYL 0 ENDOLOOP (SUTURE) ×3 IMPLANT
TACKER 5MM HERNIA 3.5CML NAB (ENDOMECHANICALS) ×6 IMPLANT
TOWEL OR 17X26 10 PK STRL BLUE (TOWEL DISPOSABLE) ×3 IMPLANT
TRAY FOLEY CATH 14FRSI W/METER (CATHETERS) ×3 IMPLANT
TRAY LAPAROSCOPIC (CUSTOM PROCEDURE TRAY) ×3 IMPLANT
TROCAR ADV FIXATION 5X100MM (TROCAR) ×3 IMPLANT
TROCAR BLADELESS OPT 5 75 (ENDOMECHANICALS) ×9 IMPLANT
TUBING INSUFFLATION 10FT LAP (TUBING) ×3 IMPLANT

## 2014-11-15 NOTE — Anesthesia Postprocedure Evaluation (Signed)
  Anesthesia Post-op Note  Patient: Kristine Simmons  Procedure(s) Performed: Procedure(s) (LRB): LAPAROSCOPIC RIGHT INGUINAL HERNIA WITH MESH (Right)  Patient Location: PACU  Anesthesia Type: General  Level of Consciousness: awake and alert   Airway and Oxygen Therapy: Patient Spontanous Breathing  Post-op Pain: mild  Post-op Assessment: Post-op Vital signs reviewed, Patient's Cardiovascular Status Stable, Respiratory Function Stable, Patent Airway and No signs of Nausea or vomiting  Last Vitals:  Filed Vitals:   11/15/14 1230  BP: 120/61  Pulse: 72  Temp: 35.7 C  Resp: 16    Post-op Vital Signs: stable   Complications: No apparent anesthesia complications

## 2014-11-15 NOTE — Anesthesia Preprocedure Evaluation (Signed)
Anesthesia Evaluation  Patient identified by MRN, date of birth, ID band Patient awake    Reviewed: Allergy & Precautions, H&P , NPO status , Patient's Chart, lab work & pertinent test results  History of Anesthesia Complications (+) history of anesthetic complications  Airway Mallampati: II  TM Distance: >3 FB Neck ROM: Full    Dental no notable dental hx.    Pulmonary COPDformer smoker,  breath sounds clear to auscultation  Pulmonary exam normal       Cardiovascular hypertension, Pt. on medications + Peripheral Vascular Disease Rhythm:Regular Rate:Normal     Neuro/Psych PSYCHIATRIC DISORDERS Depression CVA    GI/Hepatic Neg liver ROS, GERD-  ,  Endo/Other  Hypothyroidism   Renal/GU negative Renal ROS  negative genitourinary   Musculoskeletal negative musculoskeletal ROS (+)   Abdominal   Peds negative pediatric ROS (+)  Hematology negative hematology ROS (+)   Anesthesia Other Findings   Reproductive/Obstetrics negative OB ROS                             Anesthesia Physical Anesthesia Plan  ASA: III  Anesthesia Plan: General   Post-op Pain Management:    Induction: Intravenous  Airway Management Planned: Oral ETT  Additional Equipment:   Intra-op Plan:   Post-operative Plan: Extubation in OR  Informed Consent: I have reviewed the patients History and Physical, chart, labs and discussed the procedure including the risks, benefits and alternatives for the proposed anesthesia with the patient or authorized representative who has indicated his/her understanding and acceptance.   Dental advisory given  Plan Discussed with: CRNA  Anesthesia Plan Comments:         Anesthesia Quick Evaluation

## 2014-11-15 NOTE — Plan of Care (Signed)
Problem: Phase III Progression Outcomes Goal: Pain controlled on oral analgesia Outcome: Completed/Met Date Met:  11/15/14

## 2014-11-15 NOTE — Transfer of Care (Signed)
Immediate Anesthesia Transfer of Care Note  Patient: Kristine Simmons  Procedure(s) Performed: Procedure(s): LAPAROSCOPIC RIGHT INGUINAL HERNIA WITH MESH (Right)  Patient Location: PACU  Anesthesia Type:General  Level of Consciousness: awake, sedated and patient cooperative  Airway & Oxygen Therapy: Patient Spontanous Breathing and Patient connected to face mask oxygen  Post-op Assessment: Report given to PACU RN and Post -op Vital signs reviewed and stable  Post vital signs: Reviewed and stable  Complications: No apparent anesthesia complications

## 2014-11-15 NOTE — Op Note (Addendum)
11/10/2014 - 11/15/2014  11:54 AM  PATIENT:  Kristine Simmons, 80 y.o., female  MRN: [id number]  DOB: 1927/04/11  PREOP DIAGNOSIS:  recurrent right femoral hernia  POSTOP DIAGNOSIS:   recurrent right femoral hernia, weakness lateral inguinal floor (direct defect)  PROCEDURE:   Procedure(s):   Diagnostic laparoscopy, LAPAROSCOPIC RIGHT INGUINAL/femoral HERNIA repair WITH MESH  SURGEON:   Alphonsa Overall, M.D.  ANESTHESIA:   general  Anesthesiologist: Milana Obey, MD CRNA: Bailey Mech, CRNA  General  EBL:  minimal  ml  LOCAL MEDICATIONS USED:   25 cc 1/4% marcaine  SPECIMEN:   none  COUNTS CORRECT:  YES  INDICATIONS FOR PROCEDURE:  Kristine Simmons is a 80 y.o. (DOB: 1927/02/16) white  female whose primary care physician is Unice Cobble, MD and comes for repair of right femoral hernia. She had a repair of this hernia by Dr. Dalbert Batman on 08/17/2014.  She re-presented to [facility] ER with a small bowel obstruction secondary to a recurrent right femoral hernia.  The hernia was reduced, thus resolving the small bowel obstruction and  The patient was admitted to the hospital.  She has elected to stay at the hospital to get this hernia repaired.   The indications and risks of the hernia surgery were explained to the patient.  The risks include, but are not limited to, infection, bleeding, recurrence of the hernia, and nerve injury.  Operative Note:  The patient was taken to room number 6 at [facility]  She underwent a general anesthesia.  She was given 2 gm of Ancef at the beginning of the case.   A time out was held and the surgical checklist run.   His abdomen was shaved and then prepped with chloroprep.  she  had a foley catheter placed.   I first wanted to assess the right femoral hernia laparoscopically.  So I placed a 5 mm Optiview Ethicon trocar in the left upper quadrant.  I used a 5 mm scope for the exploration.  Her liver, stomach, and bowel that I could see was all normal.  In the  right groin area, I could see a defect involving the lateral 1/3 of the inguinal floor and I could pop the right femoral hernia sac in and out of the abdominal cavity.  Photos were taken.  The laparoscopic exploration gave me a good idea of the hernia defect location and what I would neeed to fix it.   I then did a pre-peritoneal hernia repair.  I made an infraumbilical incision and cut down to the rectus fascia.  I went the right side, opened the anterior rectus fascia, retracted the rectus muscle and placed the PBD balloon in the preperitoneal space.  The balloon was insufflated under direct visualization.  The balloon was in the correct position and the dissection created a preperitoneal space.   The patient had a right femoral hernia.  I dissected the preperitoneal fat posteriorly and reduced the femoral hernia.  I took down the round ligament - which was stuck to the prior repair.  She also had evidence of a lateral direct inguinal hernia.  There was no evidence of an indirect inguinal hernia.  The peritoneum was reduced to below the level of the anterior iliac spine.  I made a hole in the peritoneum and closed this with a Vicryl endoloop.   I then placed the mesh in the preperitoneal space.  I used the Bard 3D Max Light Mesh (10.3 cm x 15.7 cm).  The  mesh lay flat.  I used the Protac to secure the mesh.  I used 10 clips.  The mesh was tacked medially to the pubic bone, inferiorly to Cooper's ligament, superiorly to transversalis fascia, and laterally where I could feel the tacker with my hand.  I avoided tacks in the area lateral to the iliac vessels and laterally below the ileo-inguinal ligament.   I tried to tack up to the right femoral vein.  Photos were taken and placed in the chart.  The preperitoneal space was deflated, there were no gaps around the mesh, and the trocars removed under direct visualization.   After the repair, I re-laparoscoped the patient to re look at the right groin area.  The  repair looked intact.   I infiltrated the wounds with 25 cc of 1/4% Marcaine.  The anterior rectus fascia was closed with 0 Vicryl.  The skin was closed with 5-0 monocryl and painted with Dermabond. The sponge and needle count were correct at the end of the case.  The patient was transported to the recovery room in good condition.  His foley was removed.   Because of her underlying medical issues, I will keep her in the hospital tonight.  Alphonsa Overall, MD, [facility] Pager: [phone] Office phone:  [phone]

## 2014-11-15 NOTE — Progress Notes (Signed)
General Surgery Note  LOS: 5 days  POD -  Day of Surgery  PCP - Dr. Linna Darner Cards - Dr. Vita Barley  Assessment/Plan: 1.  Recurrent right femoral hernia with SBO. The hernia has been reduced and her SBO resolved.  Discussed with Dr. Dalbert Batman.  I discussed the indications and complications of hernia surgery with the patient.  I discussed both the laparoscopic and open approach to hernia repair.  The potential risks of hernia surgery include, but are not limited to, bleeding, infection, open surgery, nerve injury, and recurrence of the hernia.    Could not do surgery yesterday, because of lack of time.  Patient ready for surgery today.  2.  Anticoagulated  Off Eliquis since Thursday, 11/10/2104  3.  A. Fib. 4.  History of CVA 5.  Left eve vision loss 6.  COPD - has been on and off O2  7.  DVT prophylaxis - on hold for surgery  Principal Problem:   Recurrent inguinal hernia with obstruction Active Problems:   Hypothyroidism   Atrial fibrillation   Chronic anticoagulation   Recurrent femoral hernia with obstruction without gangrene   History of CVA (cerebrovascular accident)   Hypoxemia  Subjective:  Feels okay.  Does not want to go home and come back for surgery.  She wants to go ahead with surgery now.  Objective:   Filed Vitals:   11/15/14 0453  BP: 122/73  Pulse: 72  Temp: 97.5 F (36.4 C)  Resp: 17     Intake/Output from previous day:  11/23 0701 - 11/24 0700 In: 2180 [P.O.:480; I.V.:1700] Out: 1450 [Urine:1450]  Intake/Output this shift:  Total I/O In: 100 [I.V.:100] Out: -    Physical Exam:   General: Thin older WF who is alert and oriented.    HEENT: Normal. Pupils equal. .   Lungs: Clear   Abdomen: Soft.  BS present.  Right groin scar well healed.     Lab Results:   No results for input(s): WBC, HGB, HCT, PLT in the last 72 hours.  BMET  No results for input(s): NA, K, CL, CO2, GLUCOSE, BUN, CREATININE, CALCIUM in the last 72 hours.  PT/INR  No  results for input(s): LABPROT, INR in the last 72 hours.  ABG  No results for input(s): PHART, HCO3 in the last 72 hours.  Invalid input(s): PCO2, PO2   Studies/Results:  No results found.   Anti-infectives:   Anti-infectives    Start     Dose/Rate Route Frequency Ordered Stop   11/13/14 0800  ceFAZolin (ANCEF) IVPB 2 g/50 mL premix     2 g100 mL/hr over 30 Minutes Intravenous On call to O.R. 11/13/14 0736 11/14/14 0559   11/11/14 1000  [MAR Hold]  trimethoprim (TRIMPEX) tablet 100 mg     (MAR Hold since 11/15/14 0855)   100 mg Oral Every 48 hours 11/11/14 0214        Alphonsa Overall, MD, FACS Pager: [place] [facility] Office: [phone] 11/15/2014

## 2014-11-16 LAB — CBC WITH DIFFERENTIAL/PLATELET
BASOS ABS: 0.1 10*3/uL (ref 0.0–0.1)
BASOS PCT: 1 % (ref 0–1)
Eosinophils Absolute: 0.2 10*3/uL (ref 0.0–0.7)
Eosinophils Relative: 2 % (ref 0–5)
HCT: 46.1 % — ABNORMAL HIGH (ref 36.0–46.0)
Hemoglobin: 14.6 g/dL (ref 12.0–15.0)
LYMPHS PCT: 22 % (ref 12–46)
Lymphs Abs: 1.4 10*3/uL (ref 0.7–4.0)
MCH: 29.4 pg (ref 26.0–34.0)
MCHC: 31.7 g/dL (ref 30.0–36.0)
MCV: 92.9 fL (ref 78.0–100.0)
Monocytes Absolute: 0.7 10*3/uL (ref 0.1–1.0)
Monocytes Relative: 10 % (ref 3–12)
NEUTROS ABS: 4.1 10*3/uL (ref 1.7–7.7)
NEUTROS PCT: 65 % (ref 43–77)
Platelets: 176 10*3/uL (ref 150–400)
RBC: 4.96 MIL/uL (ref 3.87–5.11)
RDW: 14.7 % (ref 11.5–15.5)
WBC: 6.3 10*3/uL (ref 4.0–10.5)

## 2014-11-16 LAB — GLUCOSE, CAPILLARY: GLUCOSE-CAPILLARY: 224 mg/dL — AB (ref 70–99)

## 2014-11-16 MED ORDER — NALOXONE HCL 0.4 MG/ML IJ SOLN
0.4000 mg | INTRAMUSCULAR | Status: DC | PRN
  Administered 2014-11-16: 0.4 mg via INTRAVENOUS

## 2014-11-16 MED ORDER — NALOXONE HCL 0.4 MG/ML IJ SOLN
INTRAMUSCULAR | Status: AC
  Filled 2014-11-16: qty 1

## 2014-11-16 MED ORDER — SODIUM CHLORIDE 0.9 % IV SOLN
Freq: Once | INTRAVENOUS | Status: AC
  Administered 2014-11-16: 08:00:00 via INTRAVENOUS

## 2014-11-16 MED ORDER — TRAMADOL HCL 50 MG PO TABS: 50.0000 mg | ORAL_TABLET | Freq: Four times a day (QID) | ORAL | Status: DC | PRN

## 2014-11-16 MED ORDER — KETOROLAC TROMETHAMINE 15 MG/ML IJ SOLN
15.0000 mg | Freq: Four times a day (QID) | INTRAMUSCULAR | Status: DC | PRN
  Administered 2014-11-16 – 2014-11-17 (×2): 15 mg via INTRAVENOUS
  Filled 2014-11-16 (×2): qty 1

## 2014-11-16 NOTE — Plan of Care (Signed)
Problem: Phase I Progression Outcomes Goal: Voiding-avoid urinary catheter unless indicated Outcome: Completed/Met Date Met:  11/16/14

## 2014-11-16 NOTE — Progress Notes (Signed)
General Surgery Note  LOS: 6 days  POD -  1 Day Post-Op  Assessment/Plan: 1.  LAPAROSCOPIC RIGHT INGUINAL/femoral HERNIA repair WITH MESH - 11/15/2014 - D. Lucia Gaskins  Will see how she does today after hypotensive episode.  Maybe home later today or keep until tomorrow.  2. Anticoagulated Off Eliquis since Thursday, 11/10/2104  3. A. Fib. 4. History of CVA 5. Left eve vision loss 6. COPD - has been on and off O2  7. DVT prophylaxis -  8.  Hypotension - probably related to narcotic.  She promptly responded narcan.  She appears to be in A fib with a rate about 70.  Will bolus with 300 cc NS and check CBC   Principal Problem:   Recurrent inguinal hernia with obstruction Active Problems:   Hypothyroidism   Atrial fibrillation   Chronic anticoagulation   Recurrent femoral hernia with obstruction without gangrene   History of CVA (cerebrovascular accident)   Hypoxemia  Subjective:  Doing better now.  Responding.  Alert. Objective:   Filed Vitals:   11/16/14 0700  BP: 136/82  Pulse: 66  Temp: 98.2 F (36.8 C)  Resp: 16     Intake/Output from previous day:  11/24 0701 - 11/25 0700 In: 2570 [P.O.:120; I.V.:2450] Out: 1000 [Urine:1000]  Intake/Output this shift:      Physical Exam:   General: Older WF who is alert and oriented.    HEENT: Normal. Pupils equal. .   Lungs: Clear.   Abdomen: Soft.  BS present.  No evidence of hematoma or abdominal mass.   Wound: Clean   Lab Results:   No results for input(s): WBC, HGB, HCT, PLT in the last 72 hours.  BMET  No results for input(s): NA, K, CL, CO2, GLUCOSE, BUN, CREATININE, CALCIUM in the last 72 hours.  PT/INR  No results for input(s): LABPROT, INR in the last 72 hours.  ABG  No results for input(s): PHART, HCO3 in the last 72 hours.  Invalid input(s): PCO2, PO2   Studies/Results:  No results found.   Anti-infectives:   Anti-infectives    Start     Dose/Rate Route Frequency Ordered Stop   11/15/14 1015  ceFAZolin (ANCEF) IVPB 2 g/50 mL premix     2 g100 mL/hr over 30 Minutes Intravenous  Once 11/15/14 1005 11/15/14 1010   11/13/14 0800  ceFAZolin (ANCEF) IVPB 2 g/50 mL premix     2 g100 mL/hr over 30 Minutes Intravenous On call to O.R. 11/13/14 0736 11/14/14 0559   11/11/14 1000  trimethoprim (TRIMPEX) tablet 100 mg     100 mg Oral Every 48 hours 11/11/14 0214        Alphonsa Overall, MD, FACS Pager: [place] [facility] Office: [phone] 11/16/2014

## 2014-11-16 NOTE — Plan of Care (Signed)
Problem: Phase III Progression Outcomes Goal: Foley discontinued Outcome: Completed/Met Date Met:  11/15/14

## 2014-11-16 NOTE — Plan of Care (Signed)
Problem: Discharge Progression Outcomes Goal: Pain controlled with appropriate interventions Outcome: Completed/Met Date Met:  11/16/14     

## 2014-11-16 NOTE — Progress Notes (Signed)
1 Day Post-Op  Subjective: BP down earlier in the 40-60's, Sats were down;  RR nurse gave her some narcan.    and got a fluid bolus.  Currently she says  feels dizzy and afraid to close her eyes. Strength is equal both UE. BP 102/41.  Complains her abdomen is very sore and tender.  Objective: Vital signs in last 24 hours: Temp:  [96.2 F (35.7 C)-98.3 F (36.8 C)] 98.2 F (36.8 C) (11/25 0700) Pulse Rate:  [64-102] 66 (11/25 0700) Resp:  [9-19] 16 (11/25 0700) BP: (48-139)/(20-88) 136/82 mmHg (11/25 0700) SpO2:  [58 %-93 %] 81 % (11/25 0700) Last BM Date: 11/15/14  Intake/Output from previous day: 11/24 0701 - 11/25 0700 In: 2570 [P.O.:120; I.V.:2450] Out: 1000 [Urine:1000] Intake/Output this shift:    General appearance: alert, cooperative and no distress Resp: clear to auscultation bilaterally GI: soft, sore, few BS ALERT AND ORIENTED MOVING ALL EXTREMITIES  Lab Results:   Recent Labs  11/16/14 0750  WBC 6.3  HGB 14.6  HCT 46.1*  PLT 176    BMET No results for input(s): NA, K, CL, CO2, GLUCOSE, BUN, CREATININE, CALCIUM in the last 72 hours. PT/INR No results for input(s): LABPROT, INR in the last 72 hours.  No results for input(s): AST, ALT, ALKPHOS, BILITOT, PROT, ALBUMIN in the last 168 hours.   Lipase     Component Value Date/Time   LIPASE 29 08/14/2014 1912     Studies/Results: No results found.  Medications: . atorvastatin  20 mg Oral q1800  . B-complex with vitamin C  1 tablet Oral Daily  . calcium-vitamin D  1 tablet Oral Daily  . digoxin  0.125 mg Oral QHS  . diltiazem  120 mg Oral Daily  . estradiol  2 g Vaginal Once per day on Mon Wed Fri  . ezetimibe  10 mg Oral Daily  . feeding supplement (ENSURE COMPLETE)  237 mL Oral Daily  . FLORA-Q  1 capsule Oral Daily  . latanoprost  1 drop Both Eyes QHS  . levothyroxine  50 mcg Oral Once per day on Sun Mon Tue Thu Fri Sat  . levothyroxine  75 mcg Oral Once per day on Wed  . lip balm  1  application Topical BID  . naloxone      . polyethylene glycol  17 g Oral BID  . sertraline  50 mg Oral Daily  . timolol  1 drop Both Eyes BID  . trimethoprim  100 mg Oral Q48H    Assessment/Plan 1. LAPAROSCOPIC RIGHT INGUINAL/femoral HERNIA repair WITH MESH - 11/15/2014 - D. Lucia Gaskins Will see how she does today after hypotensive episode. Maybe home later today or keep until tomorrow.  2. Anticoagulated Off Eliquis since Thursday, 11/10/2104  3. A. Fib. 4. History of CVA 5. Left eve vision loss 6. COPD - has been on and off O2  7. DVT prophylaxis -  8. Hypotension - probably related to narcotic. She promptly responded narcan. She appears to be in A fib with a rate about Will bolus with 300 cc NS and check CBC PLAN:   I don't think she will be ready to go home today, check orthostatic BP.  Try her on some Ultram, and Toradol IV if needed for pain.  Recheck labs in AM.  Advance diet as tolerated.  Not many bowel sounds now.    LOS: 6 days    JENNINGS,WILLARD 11/16/2014  See note I wrote earlier today.  She clearly feels better -  but will keep until tomorrow.  Alphonsa Overall, MD, [facility] Pager: [phone] Office phone:  [phone]

## 2014-11-16 NOTE — Plan of Care (Signed)
Problem: Phase I Progression Outcomes Goal: Incision/dressings dry and intact Outcome: Completed/Met Date Met:  11/16/14

## 2014-11-16 NOTE — Plan of Care (Signed)
Problem: Discharge Progression Outcomes Goal: Tolerating diet Outcome: Completed/Met Date Met:  11/16/14

## 2014-11-16 NOTE — Significant Event (Signed)
Rapid Response Event Note  Overview:      Initial Focused Assessment: Neuro/Respiratory   Interventions: VM applied, Narcan 0.4 mg   Event Summary: RRT called to 1524 by bedside RN. Patient found with BP 48/20, decreased LOC, diaphoretic, low Oxygen saturations at 83%. Pt supine in bed, arousable but falls back to sleep easily. Able to state first name. Neurologically she can raise eyebrows, stick tongue out symmetrically, smile is asymmetrical with right drop but pt had CVA in 2009, grips are equal. BP 67/52, HR 72 AFib, RR 18, sats 80% on VM 50%. Oxygen increased to VM 55%, IVF increased to 200cc/hr. BP 91/45, HR 74 AFib. Narcan 0.4mg  given IVP with NS 76ml flush. BP 114/64, HR 70, RR 20, Sats 96% on VM 55%. Pt alert and oriented now, remains awake with questions. Dr Lucia Gaskins in. Oxygen decreased to [place] 2 L. Orders received from Dr Lucia Gaskins. Informed bedside RN to call RRT if further needed.    at      at          [place], [place]

## 2014-11-16 NOTE — Plan of Care (Signed)
Problem: Phase II Progression Outcomes Goal: Surgical site without signs of infection Outcome: Completed/Met Date Met:  11/16/14

## 2014-11-16 NOTE — Progress Notes (Signed)
Around  0700 am vital signs checked and pt was found to have BP of 48/20 HR 73 sats 53 on 2L.  Patient was sedated and diaphroetic and slow to response, But she was able to tell staff who she is and date of birth.    Rapid response called and she came.  On going assessement showed that she had a dilaudid IV 1mg  at 5:30 and had become sedated.  Vital signs increased to 89/40 and sats 94 with increased oxygen.   Dr Lucia Gaskins notified and narcan was ordered and given.  Patient appear to be well awake and responsive to  Current vital signs BP 136/82, P66, RR16, 81Sats at 3L.  Patient's husband was called on the phone but no answer.  Nurses name was left to call back to 5W.

## 2014-11-16 NOTE — Plan of Care (Signed)
Problem: Phase II Progression Outcomes Goal: Foley discontinued Outcome: Completed/Met Date Met:  11/16/14

## 2014-11-17 ENCOUNTER — Inpatient Hospital Stay (HOSPITAL_COMMUNITY): Payer: Medicare Other

## 2014-11-17 DIAGNOSIS — J810 Acute pulmonary edema: Secondary | ICD-10-CM

## 2014-11-17 DIAGNOSIS — J9621 Acute and chronic respiratory failure with hypoxia: Secondary | ICD-10-CM

## 2014-11-17 DIAGNOSIS — J9801 Acute bronchospasm: Secondary | ICD-10-CM

## 2014-11-17 LAB — CBC
HCT: 39.6 % (ref 36.0–46.0)
HEMOGLOBIN: 12.7 g/dL (ref 12.0–15.0)
MCH: 29.6 pg (ref 26.0–34.0)
MCHC: 32.1 g/dL (ref 30.0–36.0)
MCV: 92.3 fL (ref 78.0–100.0)
Platelets: 172 10*3/uL (ref 150–400)
RBC: 4.29 MIL/uL (ref 3.87–5.11)
RDW: 14.2 % (ref 11.5–15.5)
WBC: 8.1 10*3/uL (ref 4.0–10.5)

## 2014-11-17 LAB — BLOOD GAS, ARTERIAL
Acid-Base Excess: 0.8 mmol/L (ref 0.0–2.0)
Bicarbonate: 26.7 mEq/L — ABNORMAL HIGH (ref 20.0–24.0)
DRAWN BY: 235321
FIO2: 0.55 %
O2 SAT: 86.8 %
PCO2 ART: 49.7 mmHg — AB (ref 35.0–45.0)
PO2 ART: 54 mmHg — AB (ref 80.0–100.0)
Patient temperature: 37
TCO2: 23.5 mmol/L (ref 0–100)
pH, Arterial: 7.35 (ref 7.350–7.450)

## 2014-11-17 LAB — BASIC METABOLIC PANEL
Anion gap: 6 (ref 5–15)
BUN: 12 mg/dL (ref 6–23)
CHLORIDE: 96 meq/L (ref 96–112)
CO2: 28 meq/L (ref 19–32)
Calcium: 8.9 mg/dL (ref 8.4–10.5)
Creatinine, Ser: 0.93 mg/dL (ref 0.50–1.10)
GFR calc Af Amer: 63 mL/min — ABNORMAL LOW (ref >90)
GFR calc non Af Amer: 54 mL/min — ABNORMAL LOW (ref >90)
GLUCOSE: 99 mg/dL (ref 70–99)
POTASSIUM: 5 meq/L (ref 3.7–5.3)
Sodium: 130 mEq/L — ABNORMAL LOW (ref 137–147)

## 2014-11-17 LAB — TROPONIN I: Troponin I: <0.3 ng/mL (ref <0.30)

## 2014-11-17 MED ORDER — OXYCODONE-ACETAMINOPHEN 5-325 MG PO TABS
1.0000 | ORAL_TABLET | ORAL | Status: DC | PRN
  Administered 2014-11-18: 1 via ORAL
  Filled 2014-11-17: qty 1

## 2014-11-17 MED ORDER — METOPROLOL TARTRATE 1 MG/ML IV SOLN
2.5000 mg | INTRAVENOUS | Status: DC | PRN
Start: 2014-11-17 — End: 2014-11-21
  Filled 2014-11-17: qty 5

## 2014-11-17 MED ORDER — ENOXAPARIN SODIUM 30 MG/0.3ML ~~LOC~~ SOLN
30.0000 mg | SUBCUTANEOUS | Status: DC
  Administered 2014-11-17: 30 mg via SUBCUTANEOUS
  Filled 2014-11-17 (×2): qty 0.3

## 2014-11-17 MED ORDER — LEVALBUTEROL HCL 0.63 MG/3ML IN NEBU
0.6300 mg | INHALATION_SOLUTION | Freq: Four times a day (QID) | RESPIRATORY_TRACT | Status: DC
  Administered 2014-11-17 – 2014-11-18 (×3): 0.63 mg via RESPIRATORY_TRACT
  Filled 2014-11-17 (×2): qty 3

## 2014-11-17 MED ORDER — FUROSEMIDE 10 MG/ML IJ SOLN
INTRAMUSCULAR | Status: AC
  Administered 2014-11-17: 20 mg
  Filled 2014-11-17: qty 4

## 2014-11-17 MED ORDER — IPRATROPIUM BROMIDE 0.02 % IN SOLN
0.5000 mg | Freq: Four times a day (QID) | RESPIRATORY_TRACT | Status: DC
  Administered 2014-11-17 – 2014-11-18 (×3): 0.5 mg via RESPIRATORY_TRACT
  Filled 2014-11-17 (×2): qty 2.5

## 2014-11-17 MED ORDER — HYDROMORPHONE HCL 1 MG/ML IJ SOLN: 0.5000 mg | INTRAMUSCULAR | Status: DC | PRN

## 2014-11-17 MED ORDER — HYDRALAZINE HCL 20 MG/ML IJ SOLN
10.0000 mg | INTRAMUSCULAR | Status: DC | PRN
  Administered 2014-11-17: 10 mg via INTRAVENOUS
  Filled 2014-11-17: qty 1

## 2014-11-17 MED ORDER — BUDESONIDE 0.25 MG/2ML IN SUSP
0.2500 mg | Freq: Four times a day (QID) | RESPIRATORY_TRACT | Status: DC
  Administered 2014-11-17 – 2014-11-18 (×3): 0.25 mg via RESPIRATORY_TRACT
  Filled 2014-11-17 (×2): qty 2

## 2014-11-17 MED ORDER — FUROSEMIDE 10 MG/ML IJ SOLN
40.0000 mg | Freq: Once | INTRAMUSCULAR | Status: AC
  Administered 2014-11-17: 40 mg via INTRAVENOUS

## 2014-11-17 MED ORDER — LEVALBUTEROL HCL 0.63 MG/3ML IN NEBU: 0.6300 mg | INHALATION_SOLUTION | RESPIRATORY_TRACT | Status: DC | PRN

## 2014-11-17 MED ORDER — LEVALBUTEROL HCL 0.63 MG/3ML IN NEBU
INHALATION_SOLUTION | RESPIRATORY_TRACT | Status: AC
  Filled 2014-11-17: qty 3

## 2014-11-17 NOTE — Progress Notes (Signed)
2 Days Post-Op lap inguinal hernia repair Subjective:  Complains her abdomen is sore and tender but getting better.  She is not able to ambulate well.  Tolerating a diet.  Passing flatus, no BM  Objective: Vital signs in last 24 hours: Temp:  [98.2 F (36.8 C)-98.7 F (37.1 C)] 98.2 F (36.8 C) (11/26 0630) Pulse Rate:  [61-72] 69 (11/26 0630) Resp:  [16] 16 (11/26 0630) BP: (88-124)/(50-68) 121/50 mmHg (11/26 0630) SpO2:  [83 %-92 %] 83 % (11/26 0630) Last BM Date: 11/15/14  Intake/Output from previous day: 11/25 0701 - 11/26 0700 In: 2049.2 [P.O.:840; I.V.:1209.2] Out: 750 [Urine:750] Intake/Output this shift:    General appearance: alert, cooperative and no distress Resp: clear to auscultation bilaterally GI: soft, sore, mildly distended   Lab Results:   Recent Labs  11/16/14 0750 11/17/14 0440  WBC 6.3 8.1  HGB 14.6 12.7  HCT 46.1* 39.6  PLT 176 172    BMET  Recent Labs  11/17/14 0440  NA 130*  K 5.0  CL 96  CO2 28  GLUCOSE 99  BUN 12  CREATININE 0.93  CALCIUM 8.9   PT/INR No results for input(s): LABPROT, INR in the last 72 hours.  No results for input(s): AST, ALT, ALKPHOS, BILITOT, PROT, ALBUMIN in the last 168 hours.   Lipase     Component Value Date/Time   LIPASE 29 08/14/2014 1912     Studies/Results: No results found.  Medications: . atorvastatin  20 mg Oral q1800  . B-complex with vitamin C  1 tablet Oral Daily  . calcium-vitamin D  1 tablet Oral Daily  . digoxin  0.125 mg Oral QHS  . diltiazem  120 mg Oral Daily  . estradiol  2 g Vaginal Once per day on Mon Wed Fri  . ezetimibe  10 mg Oral Daily  . feeding supplement (ENSURE COMPLETE)  237 mL Oral Daily  . FLORA-Q  1 capsule Oral Daily  . latanoprost  1 drop Both Eyes QHS  . levothyroxine  50 mcg Oral Once per day on Sun Mon Tue Thu Fri Sat  . levothyroxine  75 mcg Oral Once per day on Wed  . lip balm  1 application Topical BID  . polyethylene glycol  17 g Oral BID  .  sertraline  50 mg Oral Daily  . timolol  1 drop Both Eyes BID  . trimethoprim  100 mg Oral Q48H    Assessment/Plan 1. LAPAROSCOPIC RIGHT INGUINAL/femoral HERNIA repair WITH MESH - 11/15/2014 - D. Newman Will wait on d/c until tomorrow, given pt does not have much help at home.  2. Anticoagulated Off Eliquis since Thursday, 11/10/2104.  Restart tom  3. A. Fib. 4. History of CVA 5. Left eve vision loss 6. COPD - has been on and off O2  7. DVT prophylaxis -  8. Hypotension - resolved   LOS: 7 days    Kristine Simmons 16/09/9603    Kristine Overall, MD, [facility] Pager: [phone] Office phone:  [phone]

## 2014-11-17 NOTE — Progress Notes (Addendum)
2 Days Post-Op  Subjective: Called to see her because of respiratory distress and low oxygen saturations.  She complained of difficulty catching her breath and had oxygen saturations in the 70s after walking to the bathroom this evening.  Her saturations are in the high 80s and oxygen by facemask.    Objective: Vital signs in last 24 hours: Temp:  [97.9 F (36.6 C)-98.7 F (37.1 C)] 97.9 F (36.6 C) (11/26 1700) Pulse Rate:  [63-86] 66 (11/26 2045) Resp:  [16-23] 23 (11/26 2045) BP: (121-186)/(50-97) 157/75 mmHg (11/26 2045) SpO2:  [83 %-89 %] 89 % (11/26 2045) FiO2 (%):  [55 %] 55 % (11/26 2045) Last BM Date: 11/15/14  Intake/Output from previous day: 11/25 0701 - 11/26 0700 In: 2049.2 [P.O.:840; I.V.:1209.2] Out: 750 [Urine:750] Intake/Output this shift: Total I/O In: -  Out: 300 [Urine:300]  PE: General- In NAD Lungs-distant breath sounds. Abdomen-soft, R groin incision clean and intact  Lab Results:   Recent Labs  11/16/14 0750 11/17/14 0440  WBC 6.3 8.1  HGB 14.6 12.7  HCT 46.1* 39.6  PLT 176 172   BMET  Recent Labs  11/17/14 0440  NA 130*  K 5.0  CL 96  CO2 28  GLUCOSE 99  BUN 12  CREATININE 0.93  CALCIUM 8.9   PT/INR No results for input(s): LABPROT, INR in the last 72 hours. Comprehensive Metabolic Panel:    Component Value Date/Time   NA 130* 11/17/2014 0440   NA 136* 11/12/2014 0419   K 5.0 11/17/2014 0440   K 4.7 11/12/2014 0419   CL 96 11/17/2014 0440   CL 96 11/12/2014 0419   CO2 28 11/17/2014 0440   CO2 32 11/12/2014 0419   BUN 12 11/17/2014 0440   BUN 10 11/12/2014 0419   CREATININE 0.93 11/17/2014 0440   CREATININE 0.93 11/12/2014 0419   GLUCOSE 99 11/17/2014 0440   GLUCOSE 86 11/12/2014 0419   CALCIUM 8.9 11/17/2014 0440   CALCIUM 8.8 11/12/2014 0419   AST 25 08/15/2014 0455   AST 34 08/14/2014 1912   ALT 17 08/15/2014 0455   ALT 23 08/14/2014 1912   ALKPHOS 38* 08/15/2014 0455   ALKPHOS 46 08/14/2014 1912   BILITOT  0.8 08/15/2014 0455   BILITOT 0.6 08/14/2014 1912   PROT 5.4* 08/15/2014 0455   PROT 6.9 08/14/2014 1912   ALBUMIN 3.1* 08/15/2014 0455   ALBUMIN 4.0 08/14/2014 1912     Studies/Results: Dg Chest Port 1 View  11/17/2014   CLINICAL DATA:  80 year old female with history of atrial fibrillation. Low oxygen saturations. Recent history of femoral hernia repair.  EXAM: PORTABLE CHEST - 1 VIEW  COMPARISON:  Acute abdominal series 11/12/2014.  FINDINGS: Compared to the prior study there are now extensive bibasilar opacities which may reflect areas of atelectasis and/or consolidation, with superimposed moderate bilateral pleural effusions. Mild diffuse peribronchial cuffing and mild interlobular septal thickening. Mild cardiomegaly. Upper mediastinal contours are within normal limits. Atherosclerotic cysts in the thoracic aorta. Status post left shoulder hemiarthroplasty.  IMPRESSION: 1. Interval development of bibasilar opacities which in large part likely reflect moderate bilateral pleural effusions with some associated passive atelectasis, however, superimposed airspace consolidation from infection or aspiration is strongly suspected. Clinical correlation is recommended. 2. In addition, there is mild cardiomegaly and evidence of very mild interstitial pulmonary edema. 3. Atherosclerosis.   Electronically Signed   By: Vinnie Langton M.D.   On: 11/17/2014 20:35    Anti-infectives: Anti-infectives    Start  Dose/Rate Route Frequency Ordered Stop   11/15/14 1015  ceFAZolin (ANCEF) IVPB 2 g/50 mL premix     2 g100 mL/hr over 30 Minutes Intravenous  Once 11/15/14 1005 11/15/14 1010   11/13/14 0800  ceFAZolin (ANCEF) IVPB 2 g/50 mL premix     2 g100 mL/hr over 30 Minutes Intravenous On call to O.R. 11/13/14 0736 11/14/14 0559   11/11/14 1000  trimethoprim (TRIMPEX) tablet 100 mg     100 mg Oral Every 48 hours 11/11/14 0214        Assessment Principal Problem:  Acute respiratory distress s/p  urgent recurrent right femoral hernia repair 11/15/25 Active Problems:   Hypothyroidism   Atrial fibrillation   Chronic anticoagulation-on hold   History of CVA (cerebrovascular accident)   Hypoxemia   COPD    LOS: 7 days   Plan: Transfer to SDU.  Pulmonary consult. Lovenox.    Anjali Manzella J 11/17/2014

## 2014-11-17 NOTE — Progress Notes (Addendum)
Rapid Response Event Note  Overview: Called to evaluate patient for shortness of breath and low O2 sat.       Initial Focused Assessment:1945-patient sitting upright in bed, patient  pink in color, abdominal breathing noted and O2 sat 79% on 3L [place].      Interventions: increased Oxygen to 4L via [place]. Patient pleasant and encouraged patient to take deep breaths.  O2 sat increased to 83% Bp 175/81, P 70.  Respiratory in room and applied Venti mask at 45% with O2 sat increased to 88%.  Patient with history of COPD and prior O2 sats ranging in 80's. Continious pulse Ox applied.  Nurse called MD and ordered received for stat chest xray.  1956- BP 120/91, venti mask increased to 55% and O2 sat increased to 90%.  Patient sitting in bed without distress at this time.  Awaiting chest x ray and to follow up with MD.  2010- O2 sat 90% on venti mask 55%, HR 64, BP 148/73, temp 97.5.  2017- Chest x-ray done.  Note done by Kandra Nicolas, Administrative Coordinator    Event Summary:   at      at          [place], [facility]

## 2014-11-17 NOTE — Progress Notes (Signed)
RT responded to rapid response with AC. Patient sats were in the low 80's (79%-83%). Patient is diminished and was on 3 Liter nasal canula. RT increased oxygen to 6 liters with no increased oxygen level. RT then placed a venti mask at 40% and oxygen sat was 87%. RT increased venti mask to 55% and sats are now 90%. Patient stated that her oxygen level at home is between 92% and 96%. RT will continue to monitor. RN is getting an order for a breathing treatment for her shortness of breath.

## 2014-11-17 NOTE — Progress Notes (Signed)
Pt was being assisted back to the bed from the bathroom when the pt began experiencing worsened shortness of breath. NT notified me of pt's O2 sat which was in the 56s. Pt was placed back on 3 liters of O2 and the pt's O2 sat still remained in the high 70s. Pt is anxious and still experiencing SOB. Rapid response was called to assess. Pt was placed on a venti mask by respiratory therapist and O2 sats began to trend up slowly to the high 80s to low 90s. Pt began calming down. MD on call notified. New orders received for a STAT CXR. MD on call notified after CXR completed. MD informed me that he would come up to see pt. After seeing pt MD recommended that pt be transferred to step down. Report called to nurse in ICU at 2128. Pt transferred.

## 2014-11-17 NOTE — Consult Note (Signed)
PULMONARY / CRITICAL CARE MEDICINE   Name: Kristine Simmons MRN: [id number] DOB: 01-29-1927    ADMISSION DATE:  11/10/2014 CONSULTATION DATE:  11/17/2014  REFERRING MD :  CCS  CHIEF COMPLAINT:  Dyspnea  INITIAL PRESENTATION: 80 year old female with COPD on home O2 presented 11/19 with abdominal pain. Admitted with recurrent femoral hernia with obstruction. Due to chronic anticoagulation she was not taken to OR until 11/24. Course complicated 33/38 by SOB and hypoxia. 70% on 3L. Transferred to ICU for PCCM eval.   STUDIES:  11/26 CT abd> Right inguinal hernia with incarceration.   SIGNIFICANT EVENTS: 11/24 to OR for hernia repair 11/26 to ICU   HISTORY OF PRESENT ILLNESS:  80 year old female with PMH as outlined below, which includes Afib, COPD on home O2 after last admission, CVA, and hypothyroid. She had a recent R femoral hernia repair with mesh 07/2014. 11/19 she presented to [facility] ED c/o abdominal pain. She was found to have recurrent R femoral hernia and was admitted to the surgical team. She has Afib and is on eliquis. This delayed her procedure until 11/24, which was without complication. 11/25 she had one episode of somnolence and hypoxia which was responsive to narcan (she had been getting dilaudid for pain management). 11/26 she was again hypoxemic and notably dyspneic. She ambulated and sats dropped into 70s on 3 liters. She was placed on venti mask and was transferred to ICU. PCCM to eval.   PAST MEDICAL HISTORY :   has a past medical history of colonic polyps; Atrial fibrillation; Fracture (1995, 2009); Blindness; Cystitis; Complication of anesthesia; Hyperlipidemia; Hyperlipidemia; Osteoporosis; Cerebrovascular accident (01/09); and Hypothyroidism.  has past surgical history that includes Tonsillectomy and adenoidectomy; Vocal Cord Polyps; Thyroid Needle Biopsy (2002); Appendectomy; Cataract extraction; Colonoscopy w/ polypectomy (2005); Total hip arthroplasty (2008); Total shoulder  replacement (2010); Mohs surgery (2013); Excision metacarpal mass (Right, 08/17/2013); Femoral hernia repair (Right, 08/17/2014); Insertion of mesh (08/17/2014); and Hernia repair. Prior to Admission medications   Medication Sig Start Date End Date Taking? Authorizing Provider  acetaminophen (TYLENOL) 325 MG tablet You can take 2 tablets every 4 hours as needed.  You cannot take more than 4000 mg of tylenol per day.  This is also in your sleep aide, and it is in your prescribed pain medicine. So you need to figure out.  Do not take more than 12 tablets of tylenol (acetaminophen) per day. 08/19/14  Yes Earnstine Regal, PA-C  B Complex Vitamins (VITAMIN B COMPLEX) TABS Take 1 tablet by mouth daily.    Yes Historical Provider, MD  bimatoprost (LUMIGAN) 0.03 % ophthalmic solution Place 1 drop into both eyes at bedtime.    Yes Historical Provider, MD  calcium carbonate (TUMS - DOSED IN MG ELEMENTAL CALCIUM) 500 MG chewable tablet Chew 2 tablets by mouth daily as needed for indigestion or heartburn (indigestion).   Yes Historical Provider, MD  Calcium Carbonate-Vitamin D (CALCIUM-VITAMIN D) 500-200 MG-UNIT per tablet Take 1 tablet by mouth daily.    Yes Historical Provider, MD  Cholecalciferol (VITAMIN D) 2000 UNITS tablet Take 2,000 Units by mouth daily.   Yes Historical Provider, MD  Cranberry (CRANBERRY CONCENTRATE) 500 MG CAPS Take 2 capsules by mouth daily.     Yes Historical Provider, MD  digoxin (LANOXIN) 0.125 MG tablet Take 0.125 mg by mouth at bedtime.    Yes Historical Provider, MD  diltiazem (CARDIZEM CD) 120 MG 24 hr capsule Take 120 mg by mouth daily.   Yes Historical Provider,  MD  diphenhydramine-acetaminophen (TYLENOL PM) 25-500 MG TABS Take 2 tablets by mouth at bedtime as needed (sleep). Infrequently   Yes Historical Provider, MD  ENSURE PLUS (ENSURE PLUS) LIQD Take 1 Can by mouth daily. 09/21/13  Yes Hendricks Limes, MD  estradiol (ESTRACE) 0.1 MG/GM vaginal cream Place 2 g vaginally. Twice  weekly   Yes Historical Provider, MD  levothyroxine (SYNTHROID, LEVOTHROID) 50 MCG tablet Take 50 mcg by mouth daily. On Weds ONLY takes 1 1/2 tabs. 08/23/14  Yes Hendricks Limes, MD  LORazepam (ATIVAN) 1 MG tablet Take 0.5 mg by mouth every 8 (eight) hours as needed for anxiety or sleep (sleep).    Yes Historical Provider, MD  Magnesium Oxide 500 MG TABS Take 1 tablet by mouth at bedtime.    Yes Historical Provider, MD  Probiotic Product (MISC INTESTINAL FLORA REGULAT) CAPS Take 1 capsule by mouth daily.     Yes Historical Provider, MD  sertraline (ZOLOFT) 50 MG tablet Take 1 tablet (50 mg total) by mouth daily. 09/22/14  Yes Hendricks Limes, MD  timolol (TIMOPTIC) 0.5 % ophthalmic solution Place 1 drop into both eyes 2 (two) times daily.    Yes Historical Provider, MD  trimethoprim (TRIMPEX) 100 MG tablet Take 100 mg by mouth every other day.   Yes Historical Provider, MD  VYTORIN 10-40 MG per tablet TAKE 1 TABLET ONCE DAILY. 10/20/14  Yes Hendricks Limes, MD  apixaban (ELIQUIS) 2.5 MG TABS tablet Take 1 tablet (2.5 mg total) by mouth 2 (two) times daily. 11/11/14   Minus Breeding, MD   Allergies  Allergen Reactions  . Hydrocodone Nausea And Vomiting  . Desmopressin Acetate     REACTION: swelling feet  . Dorzolamide Hcl Other (See Comments)    Burning in eyes and redness  . Levofloxacin     REACTION: INTERFERS WITH COUMADIN - but no longer taking warfarin  . Ciprofloxacin     REACTION: vaginal infection    FAMILY HISTORY:  has no family status information on file.  SOCIAL HISTORY:  reports that she quit smoking about 20 years ago. She does not have any smokeless tobacco history on file. She reports that she does not drink alcohol or use illicit drugs.  REVIEW OF SYSTEMS:   Bolds are positive  Constitutional: weight loss, gain, night sweats, Fevers, chills, fatigue .  HEENT: headaches, Sore throat, sneezing, nasal congestion, post nasal drip, Difficulty swallowing, Tooth/dental  problems, visual complaints visual changes, ear ache CV:  chest pain, radiates: ,Orthopnea, PND, swelling in lower extremities, dizziness, palpitations, syncope.  GI  heartburn, indigestion, abdominal pain, nausea, vomiting, diarrhea, change in bowel habits, loss of appetite, bloody stools.  Resp: cough, productive: , hemoptysis, dyspnea, chest pain, pleuritic.  Skin: rash or itching or icterus GU: dysuria, change in color of urine, urgency or frequency. flank pain, hematuria  MS: joint pain or swelling. decreased range of motion  Psych: change in mood or affect. depression or anxiety.  Neuro: difficulty with speech, weakness, numbness, ataxia    SUBJECTIVE:   VITAL SIGNS: Temp:  [97.5 F (36.4 C)-98.7 F (37.1 C)] 97.5 F (36.4 C) (11/26 2140) Pulse Rate:  [63-86] 66 (11/26 2045) Resp:  [16-23] 23 (11/26 2045) BP: (121-186)/(50-97) 157/75 mmHg (11/26 2045) SpO2:  [83 %-89 %] 89 % (11/26 2045) FiO2 (%):  [55 %] 55 % (11/26 2045) HEMODYNAMICS:   VENTILATOR SETTINGS: Vent Mode:  [-]  FiO2 (%):  [55 %] 55 % INTAKE / OUTPUT:  Intake/Output Summary (  Last 24 hours) at 11/17/14 2152 Last data filed at 11/17/14 1935  Gross per 24 hour  Intake   1160 ml  Output   2225 ml  Net  -1065 ml    PHYSICAL EXAMINATION: General:  Elderly female in mild respiratory distress Neuro:  Alert, oriented x3. Non focal.  HEENT:  [place]/AT, +JVD, PERRL Cardiovascular:  Irreg irreg, intermittently tachy.  Lungs:  Moderately labored, accessory muscle use on NRB. Scant crackles, wheeze L>R Abdomen:  Soft, non-tender, non-distended Musculoskeletal:  NO acute deformity, no edema Skin:  Several closed surgical incisions to abdomen.   LABS:  CBC  Recent Labs Lab 11/10/14 2308 11/16/14 0750 11/17/14 0440  WBC 6.3 6.3 8.1  HGB 14.6 14.6 12.7  HCT 43.4 46.1* 39.6  PLT 193 176 172   Coag's No results for input(s): APTT, INR in the last 168 hours. BMET  Recent Labs Lab 11/10/14 2308  11/12/14 0419 11/17/14 0440  NA 138 136* 130*  K 4.6 4.7 5.0  CL 98 96 96  CO2 26 32 28  BUN 14 10 12   CREATININE 0.94 0.93 0.93  GLUCOSE 94 86 99   Electrolytes  Recent Labs Lab 11/10/14 2308 11/12/14 0419 11/17/14 0440  CALCIUM 9.9 8.8 8.9   Sepsis Markers No results for input(s): LATICACIDVEN, PROCALCITON, O2SATVEN in the last 168 hours. ABG No results for input(s): PHART, PCO2ART, PO2ART in the last 168 hours. Liver Enzymes No results for input(s): AST, ALT, ALKPHOS, BILITOT, ALBUMIN in the last 168 hours. Cardiac Enzymes No results for input(s): TROPONINI, PROBNP in the last 168 hours. Glucose  Recent Labs Lab 11/16/14 0715  GLUCAP 224*    Imaging No results found.   ASSESSMENT / PLAN:  Acute hypoxemic respiratory failure Pulmonary Edema Suspect h/o COPD - Supplemental O2. SpO2 90-94% - PRN BiPAP - Scheduled nebs budesonide, levalbuterol, ipratropium  - Lasix 40mg   - CXR in AM - Hold sedating medications  Chronic Atrial Fibrillation Concern for CHF Chest pain KVO IVF PRN hydralazine to keep SBP < 170 PRN metop to keep HR < 115 F/u BNP EKG Trend troponin Consider Echo 11/27 AM  R femoral hernia s/p repair 11/24 Hypothyroidism - Management per primary   Georgann Housekeeper, ACNP [place] [facility] Pager [phone] or [phone]   PCCM ATTENDING: Pt seen on work rounds with care provider noted above. We reviewed pt's initial presentation, consultants notes and hospital database in detail.  The above assessment and plan was formulated under my direction.  In summary: Underlying lung disease - COPD, chronic O2 Post laparoscopic repair of inguinal hernia Acute on chronic hypoxic resp failure CXR compatible with edema Scattered wheezes on exam CAF Severe hypertension High risk of intubation  PLAN: Cont supp O2 Lasix X 1 BDs ordered PRN hydralazine to maintain SBP < 170 mmHg PRN metoprolol to maintain HR <  115/min PRN NPPV if she will tolerate. Otherwise, will need to proceed with intubation Follow CXR   Merton Border, MD;  PCCM service; [place] [phone]

## 2014-11-18 ENCOUNTER — Inpatient Hospital Stay (HOSPITAL_COMMUNITY): Payer: Medicare Other

## 2014-11-18 ENCOUNTER — Encounter (HOSPITAL_COMMUNITY): Payer: Self-pay | Admitting: Surgery

## 2014-11-18 ENCOUNTER — Telehealth: Payer: Self-pay

## 2014-11-18 DIAGNOSIS — J441 Chronic obstructive pulmonary disease with (acute) exacerbation: Secondary | ICD-10-CM

## 2014-11-18 DIAGNOSIS — K4031 Unilateral inguinal hernia, with obstruction, without gangrene, recurrent: Secondary | ICD-10-CM

## 2014-11-18 DIAGNOSIS — I350 Nonrheumatic aortic (valve) stenosis: Secondary | ICD-10-CM

## 2014-11-18 DIAGNOSIS — I480 Paroxysmal atrial fibrillation: Secondary | ICD-10-CM

## 2014-11-18 LAB — CBC
HCT: 45.3 % (ref 36.0–46.0)
Hemoglobin: 15.2 g/dL — ABNORMAL HIGH (ref 12.0–15.0)
MCH: 30.2 pg (ref 26.0–34.0)
MCHC: 33.6 g/dL (ref 30.0–36.0)
MCV: 89.9 fL (ref 78.0–100.0)
PLATELETS: 195 10*3/uL (ref 150–400)
RBC: 5.04 MIL/uL (ref 3.87–5.11)
RDW: 13.8 % (ref 11.5–15.5)
WBC: 9.5 10*3/uL (ref 4.0–10.5)

## 2014-11-18 LAB — BASIC METABOLIC PANEL
Anion gap: 13 (ref 5–15)
BUN: 10 mg/dL (ref 6–23)
CALCIUM: 8.9 mg/dL (ref 8.4–10.5)
CO2: 31 mEq/L (ref 19–32)
CREATININE: 0.77 mg/dL (ref 0.50–1.10)
Chloride: 87 mEq/L — ABNORMAL LOW (ref 96–112)
GFR calc non Af Amer: 74 mL/min — ABNORMAL LOW (ref >90)
GFR, EST AFRICAN AMERICAN: 86 mL/min — AB (ref >90)
Glucose, Bld: 128 mg/dL — ABNORMAL HIGH (ref 70–99)
Potassium: 4.3 mEq/L (ref 3.7–5.3)
Sodium: 131 mEq/L — ABNORMAL LOW (ref 137–147)

## 2014-11-18 LAB — TROPONIN I
Troponin I: <0.3 ng/mL (ref <0.30)
Troponin I: <0.3 ng/mL (ref <0.30)

## 2014-11-18 LAB — MRSA PCR SCREENING: MRSA by PCR: NEGATIVE

## 2014-11-18 LAB — PRO B NATRIURETIC PEPTIDE: PRO B NATRI PEPTIDE: 6363 pg/mL — AB (ref 0–450)

## 2014-11-18 MED ORDER — IPRATROPIUM-ALBUTEROL 0.5-2.5 (3) MG/3ML IN SOLN: 3.0000 mL | RESPIRATORY_TRACT | Status: DC | PRN

## 2014-11-18 MED ORDER — FUROSEMIDE 10 MG/ML IJ SOLN
INTRAMUSCULAR | Status: AC
  Administered 2014-11-18: 10 mg
  Filled 2014-11-18: qty 2

## 2014-11-18 MED ORDER — APIXABAN 2.5 MG PO TABS
2.5000 mg | ORAL_TABLET | Freq: Two times a day (BID) | ORAL | Status: DC
  Administered 2014-11-18 – 2014-11-21 (×7): 2.5 mg via ORAL
  Filled 2014-11-18 (×8): qty 1

## 2014-11-18 NOTE — Telephone Encounter (Signed)
-----   Message from Juanito Doom, MD sent at 11/18/2014 12:10 PM EST ----- Hi,  Please arrange outpatient PFT and hospital follow up with me next available  Thanks B

## 2014-11-18 NOTE — Telephone Encounter (Signed)
lmtcb for pt to schedule rov.  Will order pft after speaking to pt.

## 2014-11-18 NOTE — Progress Notes (Signed)
PULMONARY / CRITICAL CARE MEDICINE   Name: Kristine Simmons MRN: [id number] DOB: [birth year]    ADMISSION DATE:  11/10/2014 CONSULTATION DATE:  11/17/2014  REFERRING MD :  CCS  CHIEF COMPLAINT:  Dyspnea  INITIAL PRESENTATION: 80 year old female with COPD on home O2 presented 11/19 with abdominal pain. Admitted with recurrent femoral hernia with obstruction. Due to chronic anticoagulation she was not taken to OR until 11/24. Course complicated 33/35 by SOB and hypoxia. 70% on 3L. Transferred to ICU for PCCM eval.   STUDIES:  11/26 CT abd> Right inguinal hernia with incarceration.   SIGNIFICANT EVENTS: 11/24 to OR for hernia repair 11/26 to ICU    SUBJECTIVE: Feels much better after diuresis last night; resting comfortably  VITAL SIGNS: Temp:  [97.5 F (36.4 C)-100.2 F (37.9 C)] 100.2 F (37.9 C) (11/27 1100) Pulse Rate:  [66-120] 87 (11/27 1100) Resp:  [12-28] 15 (11/27 1100) BP: (98-229)/(40-147) 123/98 mmHg (11/27 0800) SpO2:  [82 %-92 %] 91 % (11/27 1100) FiO2 (%):  [55 %] 55 % (11/26 2300) Weight:  [62.7 kg (138 lb 3.7 oz)] 62.7 kg (138 lb 3.7 oz) (11/26 2300) HEMODYNAMICS:   VENTILATOR SETTINGS: Vent Mode:  [-]  FiO2 (%):  [55 %] 55 % INTAKE / OUTPUT:  Intake/Output Summary (Last 24 hours) at 11/18/14 1204 Last data filed at 11/18/14 1100  Gross per 24 hour  Intake    340 ml  Output   4315 ml  Net  -3975 ml    PHYSICAL EXAMINATION: General:  Elderly female in no distress HEENT: [place] EOMi, PERRL PULM: few crackles in bases CV: irreg irreg, systolic murmur AB: BS infrequent, soft, nontender Ext: warm, trace edema Neuro: A&Ox4   LABS:  CBC  Recent Labs Lab 11/16/14 0750 11/17/14 0440 11/18/14 0341  WBC 6.3 8.1 9.5  HGB 14.6 12.7 15.2*  HCT 46.1* 39.6 45.3  PLT 176 172 195   Coag's No results for input(s): APTT, INR in the last 168 hours. BMET  Recent Labs Lab 11/12/14 0419 11/17/14 0440 11/18/14 0341  NA 136* 130* 131*  K 4.7 5.0 4.3   CL 96 96 87*  CO2 32 28 31  BUN 10 12 10   CREATININE 0.93 0.93 0.77  GLUCOSE 86 99 128*   Electrolytes  Recent Labs Lab 11/12/14 0419 11/17/14 0440 11/18/14 0341  CALCIUM 8.8 8.9 8.9   Sepsis Markers No results for input(s): LATICACIDVEN, PROCALCITON, O2SATVEN in the last 168 hours. ABG  Recent Labs Lab 11/17/14 2145  PHART 7.350  PCO2ART 49.7*  PO2ART 54.0*   Liver Enzymes No results for input(s): AST, ALT, ALKPHOS, BILITOT, ALBUMIN in the last 168 hours. Cardiac Enzymes  Recent Labs Lab 11/17/14 2315 11/18/14 0341 11/18/14 1025  TROPONINI <0.30 <0.30 <0.30  PROBNP  --  6363.0*  --    Glucose  Recent Labs Lab 11/16/14 0715  GLUCAP 224*    Imaging Dg Chest Port 1 View  11/17/2014   CLINICAL DATA:  80 year old female with history of atrial fibrillation. Low oxygen saturations. Recent history of femoral hernia repair.  EXAM: PORTABLE CHEST - 1 VIEW  COMPARISON:  Acute abdominal series 11/12/2014.  FINDINGS: Compared to the prior study there are now extensive bibasilar opacities which may reflect areas of atelectasis and/or consolidation, with superimposed moderate bilateral pleural effusions. Mild diffuse peribronchial cuffing and mild interlobular septal thickening. Mild cardiomegaly. Upper mediastinal contours are within normal limits. Atherosclerotic cysts in the thoracic aorta. Status post left shoulder hemiarthroplasty.  IMPRESSION: 1.  Interval development of bibasilar opacities which in large part likely reflect moderate bilateral pleural effusions with some associated passive atelectasis, however, superimposed airspace consolidation from infection or aspiration is strongly suspected. Clinical correlation is recommended. 2. In addition, there is mild cardiomegaly and evidence of very mild interstitial pulmonary edema. 3. Atherosclerosis.   Electronically Signed   By: Vinnie Langton M.D.   On: 11/17/2014 20:35     ASSESSMENT / PLAN:  Acute hypoxemic  respiratory failure due to pulmonary edema Suspect h/o COPD - Supplemental O2. SpO2 90-94% - change nebs to prn duoneb - Lasix 40mg  x1 again today - Hold sedating medications - needs outpatient PFT and pulmonary consult  - would discharge with combivent to use 1 puff q6h prn  Chronic Atrial Fibrillation Doubt CHF based on 05/2014 echo; Suspect that the pulmonary edema came from aortic stenosis with afib after volume resuscitation post op Chest pain -KVO IVF -PRN hydralazine to keep SBP < 170 -PRN metop to keep HR < 115 -lasix again today -needs outpatient f/u with Dr. Percival Spanish   R femoral hernia s/p repair 11/24 Hypothyroidism - Management per primary  PCCM will be available as needed through weekend, call if questions  Roselie Awkward, MD [place] [facility] Pager: [phone] Cell: [phone] If no response, call [phone]

## 2014-11-18 NOTE — Plan of Care (Signed)
Problem: Phase I Progression Outcomes Goal: Initial discharge plan identified Outcome: Completed/Met Date Met:  11/18/14     

## 2014-11-18 NOTE — Progress Notes (Signed)
3 Days Post-Op  Subjective: Feels much better.  Breathing easier.  Passing gas.    Objective: Vital signs in last 24 hours: Temp:  [97.5 F (36.4 C)-99.5 F (37.5 C)] 99.3 F (37.4 C) (11/27 0700) Pulse Rate:  [66-120] 102 (11/27 0700) Resp:  [15-28] 17 (11/27 0700) BP: (98-229)/(40-147) 136/69 mmHg (11/27 0700) SpO2:  [82 %-92 %] 90 % (11/27 0700) FiO2 (%):  [55 %] 55 % (11/26 2300) Weight:  [138 lb 3.7 oz (62.7 kg)] 138 lb 3.7 oz (62.7 kg) (11/26 2300) Last BM Date: 11/15/14  Intake/Output from previous day: 11/26 0701 - 11/27 0700 In: 550.8 [P.O.:360; I.V.:190.8] Out: 4915 [Urine:4915] Intake/Output this shift:    PE: General- In NAD. Respirations not labored Abdomen-soft, R groin incision clean and intact  Lab Results:   Recent Labs  11/17/14 0440 11/18/14 0341  WBC 8.1 9.5  HGB 12.7 15.2*  HCT 39.6 45.3  PLT 172 195   BMET  Recent Labs  11/17/14 0440 11/18/14 0341  NA 130* 131*  K 5.0 4.3  CL 96 87*  CO2 28 31  GLUCOSE 99 128*  BUN 12 10  CREATININE 0.93 0.77  CALCIUM 8.9 8.9   PT/INR No results for input(s): LABPROT, INR in the last 72 hours. Comprehensive Metabolic Panel:    Component Value Date/Time   NA 131* 11/18/2014 0341   NA 130* 11/17/2014 0440   K 4.3 11/18/2014 0341   K 5.0 11/17/2014 0440   CL 87* 11/18/2014 0341   CL 96 11/17/2014 0440   CO2 31 11/18/2014 0341   CO2 28 11/17/2014 0440   BUN 10 11/18/2014 0341   BUN 12 11/17/2014 0440   CREATININE 0.77 11/18/2014 0341   CREATININE 0.93 11/17/2014 0440   GLUCOSE 128* 11/18/2014 0341   GLUCOSE 99 11/17/2014 0440   CALCIUM 8.9 11/18/2014 0341   CALCIUM 8.9 11/17/2014 0440   AST 25 08/15/2014 0455   AST 34 08/14/2014 1912   ALT 17 08/15/2014 0455   ALT 23 08/14/2014 1912   ALKPHOS 38* 08/15/2014 0455   ALKPHOS 46 08/14/2014 1912   BILITOT 0.8 08/15/2014 0455   BILITOT 0.6 08/14/2014 1912   PROT 5.4* 08/15/2014 0455   PROT 6.9 08/14/2014 1912   ALBUMIN 3.1* 08/15/2014  0455   ALBUMIN 4.0 08/14/2014 1912     Studies/Results: Dg Chest Port 1 View  11/18/2014   CLINICAL DATA:  Respiratory failure  EXAM: PORTABLE CHEST - 1 VIEW  COMPARISON:  11/17/2014  FINDINGS: Cardiac shadow is stable. Bilateral small pleural effusions and basilar atelectatic changes are again seen. No focal confluent infiltrate is noted. Diffuse interstitial changes are noted. Aortic calcifications are noted. No other focal abnormality is seen.  IMPRESSION: Stable appearance of the chest given some variation in patient positioning.   Electronically Signed   By: Inez Catalina M.D.   On: 11/18/2014 07:23   Dg Chest Port 1 View  11/17/2014   CLINICAL DATA:  80 year old female with history of atrial fibrillation. Low oxygen saturations. Recent history of femoral hernia repair.  EXAM: PORTABLE CHEST - 1 VIEW  COMPARISON:  Acute abdominal series 11/12/2014.  FINDINGS: Compared to the prior study there are now extensive bibasilar opacities which may reflect areas of atelectasis and/or consolidation, with superimposed moderate bilateral pleural effusions. Mild diffuse peribronchial cuffing and mild interlobular septal thickening. Mild cardiomegaly. Upper mediastinal contours are within normal limits. Atherosclerotic cysts in the thoracic aorta. Status post left shoulder hemiarthroplasty.  IMPRESSION: 1. Interval development of bibasilar  opacities which in large part likely reflect moderate bilateral pleural effusions with some associated passive atelectasis, however, superimposed airspace consolidation from infection or aspiration is strongly suspected. Clinical correlation is recommended. 2. In addition, there is mild cardiomegaly and evidence of very mild interstitial pulmonary edema. 3. Atherosclerosis.   Electronically Signed   By: Vinnie Langton M.D.   On: 11/17/2014 20:35    Anti-infectives: Anti-infectives    Start     Dose/Rate Route Frequency Ordered Stop   11/15/14 1015  ceFAZolin (ANCEF) IVPB  2 g/50 mL premix     2 g100 mL/hr over 30 Minutes Intravenous  Once 11/15/14 1005 11/15/14 1010   11/13/14 0800  ceFAZolin (ANCEF) IVPB 2 g/50 mL premix     2 g100 mL/hr over 30 Minutes Intravenous On call to O.R. 11/13/14 0736 11/14/14 0559   11/11/14 1000  trimethoprim (TRIMPEX) tablet 100 mg     100 mg Oral Every 48 hours 11/11/14 0214        Assessment  Acute respiratory distress-significantly improved; appreciate CCM care  s/p urgent recurrent right femoral hernia repair 11/15/25  Hypothyroidism   Atrial fibrillation   Chronic anticoagulation-on hold   History of CVA (cerebrovascular accident)   Hypoxemia   COPD    LOS: 8 days   Plan:  Restart diet.  Restart Eliquis    Shunta Mclaurin J 11/18/2014

## 2014-11-19 MED ORDER — FUROSEMIDE 10 MG/ML IJ SOLN
40.0000 mg | Freq: Once | INTRAMUSCULAR | Status: AC
  Administered 2014-11-19: 40 mg via INTRAVENOUS
  Filled 2014-11-19: qty 4

## 2014-11-19 NOTE — Plan of Care (Signed)
Problem: Phase II Progression Outcomes Goal: Progress activity as tolerated unless otherwise ordered Outcome: Progressing Goal: IV changed to normal saline lock Outcome: Completed/Met Date Met:  11/19/14

## 2014-11-19 NOTE — Plan of Care (Signed)
Problem: Phase I Progression Outcomes Goal: Pain controlled with appropriate interventions Outcome: Completed/Met Date Met:  11/19/14 Goal: Sutures/staples intact Outcome: Not Applicable Date Met:  07/03/18 Goal: Tubes/drains patent Outcome: Not Applicable Date Met:  75/88/32  Problem: Phase II Progression Outcomes Goal: Pain controlled Outcome: Completed/Met Date Met:  11/19/14 Goal: Progressing with IS, TCDB Outcome: Completed/Met Date Met:  11/19/14 Goal: Dressings dry/intact Outcome: Not Applicable Date Met:  54/98/26 Goal: Sutures/staples intact Outcome: Not Applicable Date Met:  41/58/30 Goal: Foley discontinued Outcome: Not Met (add Reason) New Foley catheter inserted in ICU due to significant urinary output from new diuretic.  2nd diuretic ordered for additional diuresis.  Catheter will be d/c as soon as output normalizes. Goal: Tolerating diet Outcome: Progressing Tolerating diet, but poor appetite.

## 2014-11-19 NOTE — Progress Notes (Signed)
4 Days Post-Op  Subjective: Comfortable.  Sat up in chair yesterday.     Objective: Vital signs in last 24 hours: Temp:  [97.2 F (36.2 C)-100.2 F (37.9 C)] 97.2 F (36.2 C) (11/28 0300) Pulse Rate:  [64-119] 64 (11/28 0400) Resp:  [12-20] 16 (11/28 0400) BP: (93-129)/(44-98) 102/56 mmHg (11/28 0400) SpO2:  [89 %-93 %] 92 % (11/28 0400) Weight:  [139 lb 15.9 oz (63.5 kg)] 139 lb 15.9 oz (63.5 kg) (11/28 0300) Last BM Date: 11/17/14  Intake/Output from previous day: 11/27 0701 - 11/28 0700 In: 700 [P.O.:700] Out: 2050 [Urine:2050] Intake/Output this shift:    PE: General- In NAD. Respirations not labored Abdomen-soft, R groin incision clean and intact  Lab Results:   Recent Labs  11/17/14 0440 11/18/14 0341  WBC 8.1 9.5  HGB 12.7 15.2*  HCT 39.6 45.3  PLT 172 195   BMET  Recent Labs  11/17/14 0440 11/18/14 0341  NA 130* 131*  K 5.0 4.3  CL 96 87*  CO2 28 31  GLUCOSE 99 128*  BUN 12 10  CREATININE 0.93 0.77  CALCIUM 8.9 8.9   PT/INR No results for input(s): LABPROT, INR in the last 72 hours. Comprehensive Metabolic Panel:    Component Value Date/Time   NA 131* 11/18/2014 0341   NA 130* 11/17/2014 0440   K 4.3 11/18/2014 0341   K 5.0 11/17/2014 0440   CL 87* 11/18/2014 0341   CL 96 11/17/2014 0440   CO2 31 11/18/2014 0341   CO2 28 11/17/2014 0440   BUN 10 11/18/2014 0341   BUN 12 11/17/2014 0440   CREATININE 0.77 11/18/2014 0341   CREATININE 0.93 11/17/2014 0440   GLUCOSE 128* 11/18/2014 0341   GLUCOSE 99 11/17/2014 0440   CALCIUM 8.9 11/18/2014 0341   CALCIUM 8.9 11/17/2014 0440   AST 25 08/15/2014 0455   AST 34 08/14/2014 1912   ALT 17 08/15/2014 0455   ALT 23 08/14/2014 1912   ALKPHOS 38* 08/15/2014 0455   ALKPHOS 46 08/14/2014 1912   BILITOT 0.8 08/15/2014 0455   BILITOT 0.6 08/14/2014 1912   PROT 5.4* 08/15/2014 0455   PROT 6.9 08/14/2014 1912   ALBUMIN 3.1* 08/15/2014 0455   ALBUMIN 4.0 08/14/2014 1912      Studies/Results: Dg Chest Port 1 View  11/18/2014   CLINICAL DATA:  Respiratory failure  EXAM: PORTABLE CHEST - 1 VIEW  COMPARISON:  11/17/2014  FINDINGS: Cardiac shadow is stable. Bilateral small pleural effusions and basilar atelectatic changes are again seen. No focal confluent infiltrate is noted. Diffuse interstitial changes are noted. Aortic calcifications are noted. No other focal abnormality is seen.  IMPRESSION: Stable appearance of the chest given some variation in patient positioning.   Electronically Signed   By: Inez Catalina M.D.   On: 11/18/2014 07:23   Dg Chest Port 1 View  11/17/2014   CLINICAL DATA:  80 year old female with history of atrial fibrillation. Low oxygen saturations. Recent history of femoral hernia repair.  EXAM: PORTABLE CHEST - 1 VIEW  COMPARISON:  Acute abdominal series 11/12/2014.  FINDINGS: Compared to the prior study there are now extensive bibasilar opacities which may reflect areas of atelectasis and/or consolidation, with superimposed moderate bilateral pleural effusions. Mild diffuse peribronchial cuffing and mild interlobular septal thickening. Mild cardiomegaly. Upper mediastinal contours are within normal limits. Atherosclerotic cysts in the thoracic aorta. Status post left shoulder hemiarthroplasty.  IMPRESSION: 1. Interval development of bibasilar opacities which in large part likely reflect moderate bilateral pleural effusions  with some associated passive atelectasis, however, superimposed airspace consolidation from infection or aspiration is strongly suspected. Clinical correlation is recommended. 2. In addition, there is mild cardiomegaly and evidence of very mild interstitial pulmonary edema. 3. Atherosclerosis.   Electronically Signed   By: Vinnie Langton M.D.   On: 11/17/2014 20:35    Anti-infectives: Anti-infectives    Start     Dose/Rate Route Frequency Ordered Stop   11/15/14 1015  ceFAZolin (ANCEF) IVPB 2 g/50 mL premix     2 g100 mL/hr  over 30 Minutes Intravenous  Once 11/15/14 1005 11/15/14 1010   11/13/14 0800  ceFAZolin (ANCEF) IVPB 2 g/50 mL premix     2 g100 mL/hr over 30 Minutes Intravenous On call to O.R. 11/13/14 0736 11/14/14 0559   11/11/14 1000  trimethoprim (TRIMPEX) tablet 100 mg     100 mg Oral Every 48 hours 11/11/14 0214        Assessment  Acute respiratory distress-resolved  s/p urgent recurrent right femoral hernia repair 11/15/25  Hypothyroidism   Atrial fibrillation   Chronic anticoagulation-on hold   History of CVA (cerebrovascular accident)   Hypoxemia   COPD    LOS: 9 days   Plan:  One more dose of Lasix today.  Transfer to floor.   Areil Ottey J 11/19/2014

## 2014-11-19 NOTE — Plan of Care (Signed)
Problem: Consults Goal: Diabetes Guidelines if Diabetic/Glucose > 140 If diabetic or lab glucose is > 140 mg/dl - Initiate Diabetes/Hyperglycemia Guidelines & Document Interventions  Outcome: Not Applicable Date Met:  05/09/32  Problem: Phase I Progression Outcomes Goal: Other Phase I Outcomes/Goals Outcome: Completed/Met Date Met:  11/19/14

## 2014-11-20 LAB — BASIC METABOLIC PANEL
Anion gap: 9 (ref 5–15)
BUN: 11 mg/dL (ref 6–23)
CHLORIDE: 90 meq/L — AB (ref 96–112)
CO2: 34 mEq/L — ABNORMAL HIGH (ref 19–32)
Calcium: 8.9 mg/dL (ref 8.4–10.5)
Creatinine, Ser: 0.87 mg/dL (ref 0.50–1.10)
GFR calc non Af Amer: 59 mL/min — ABNORMAL LOW (ref >90)
GFR, EST AFRICAN AMERICAN: 68 mL/min — AB (ref >90)
Glucose, Bld: 98 mg/dL (ref 70–99)
POTASSIUM: 4.1 meq/L (ref 3.7–5.3)
Sodium: 133 mEq/L — ABNORMAL LOW (ref 137–147)

## 2014-11-20 LAB — CBC
HEMATOCRIT: 41.1 % (ref 36.0–46.0)
HEMOGLOBIN: 13.9 g/dL (ref 12.0–15.0)
MCH: 29.8 pg (ref 26.0–34.0)
MCHC: 33.8 g/dL (ref 30.0–36.0)
MCV: 88.2 fL (ref 78.0–100.0)
Platelets: 206 10*3/uL (ref 150–400)
RBC: 4.66 MIL/uL (ref 3.87–5.11)
RDW: 13.8 % (ref 11.5–15.5)
WBC: 8.4 10*3/uL (ref 4.0–10.5)

## 2014-11-20 NOTE — Progress Notes (Signed)
5 Days Post-Op lap ing hernia repair Subjective: Breathing comfortably.  Denies abd pain.  Tolerating a diet.   Objective: Vital signs in last 24 hours: Temp:  [97.6 F (36.4 C)-98.3 F (36.8 C)] 97.6 F (36.4 C) (11/29 0602) Pulse Rate:  [75-81] 81 (11/29 0602) Resp:  [16-22] 16 (11/29 0602) BP: (97-116)/(56-74) 108/61 mmHg (11/29 0602) SpO2:  [90 %-97 %] 93 % (11/29 0602) Last BM Date: 11/19/14  Intake/Output from previous day: 11/28 0701 - 11/29 0700 In: 840 [P.O.:840] Out: 3701 [Urine:3700; Stool:1] Intake/Output this shift:    PE: General- In NAD. Respirations not labored Abdomen-soft, incision clean and intact  Lab Results:   Recent Labs  11/18/14 0341 11/20/14 0535  WBC 9.5 8.4  HGB 15.2* 13.9  HCT 45.3 41.1  PLT 195 206   BMET  Recent Labs  11/18/14 0341 11/20/14 0535  NA 131* 133*  K 4.3 4.1  CL 87* 90*  CO2 31 34*  GLUCOSE 128* 98  BUN 10 11  CREATININE 0.77 0.87  CALCIUM 8.9 8.9   PT/INR No results for input(s): LABPROT, INR in the last 72 hours. Comprehensive Metabolic Panel:    Component Value Date/Time   NA 133* 11/20/2014 0535   NA 131* 11/18/2014 0341   K 4.1 11/20/2014 0535   K 4.3 11/18/2014 0341   CL 90* 11/20/2014 0535   CL 87* 11/18/2014 0341   CO2 34* 11/20/2014 0535   CO2 31 11/18/2014 0341   BUN 11 11/20/2014 0535   BUN 10 11/18/2014 0341   CREATININE 0.87 11/20/2014 0535   CREATININE 0.77 11/18/2014 0341   GLUCOSE 98 11/20/2014 0535   GLUCOSE 128* 11/18/2014 0341   CALCIUM 8.9 11/20/2014 0535   CALCIUM 8.9 11/18/2014 0341   AST 25 08/15/2014 0455   AST 34 08/14/2014 1912   ALT 17 08/15/2014 0455   ALT 23 08/14/2014 1912   ALKPHOS 38* 08/15/2014 0455   ALKPHOS 46 08/14/2014 1912   BILITOT 0.8 08/15/2014 0455   BILITOT 0.6 08/14/2014 1912   PROT 5.4* 08/15/2014 0455   PROT 6.9 08/14/2014 1912   ALBUMIN 3.1* 08/15/2014 0455   ALBUMIN 4.0 08/14/2014 1912     Studies/Results: No results  found.  Anti-infectives: Anti-infectives    Start     Dose/Rate Route Frequency Ordered Stop   11/15/14 1015  ceFAZolin (ANCEF) IVPB 2 g/50 mL premix     2 g100 mL/hr over 30 Minutes Intravenous  Once 11/15/14 1005 11/15/14 1010   11/13/14 0800  ceFAZolin (ANCEF) IVPB 2 g/50 mL premix     2 g100 mL/hr over 30 Minutes Intravenous On call to O.R. 11/13/14 0736 11/14/14 0559   11/11/14 1000  trimethoprim (TRIMPEX) tablet 100 mg     100 mg Oral Every 48 hours 11/11/14 0214        Assessment  Acute respiratory distress-resolved  s/p urgent recurrent right femoral hernia repair 11/15/25  Hypothyroidism   Atrial fibrillation   Chronic anticoagulation-on hold   History of CVA (cerebrovascular accident)   Hypoxemia   COPD    LOS: 10 days   Plan:  Cont diet.  Wean O2.  PT/OT eval.  May need SNF to d/c   Friant, Elmo Putt C. 34/19/3790

## 2014-11-20 NOTE — Progress Notes (Signed)
PT Cancellation Note  Patient Details Name: Kristine Simmons MRN: [id number] DOB: 1927/10/21   Cancelled Treatment:    Reason Eval/Treat Not Completed: Other (comment) (pt is eating lunch and just walked in hall prior to lunch. Will see tomorrow. )   Blondell Reveal Kistler 11/20/2014, 2:18 PM  [phone]

## 2014-11-20 NOTE — Plan of Care (Signed)
Problem: Phase I Progression Outcomes Goal: Initial discharge plan identified Outcome: Progressing MD mentioned possibility of rehab placement to pt.  Problem: Phase II Progression Outcomes Goal: Tolerating diet Outcome: Progressing Appetite improving. Goal: Other Phase II Outcomes/Goals Outcome: Completed/Met Date Met:  11/20/14  Problem: Phase III Progression Outcomes Goal: Pain controlled on oral analgesia Outcome: Completed/Met Date Met:  11/20/14 Goal: Nasogastric tube discontinued Outcome: Not Applicable Date Met:  62/86/38 Goal: Demonstrates TCDB, IS independently Outcome: Completed/Met Date Met:  11/20/14

## 2014-11-20 NOTE — Plan of Care (Signed)
Problem: Phase I Progression Outcomes Goal: OOB as tolerated unless otherwise ordered Outcome: Completed/Met Date Met:  11/20/14 Goal: Vital signs/hemodynamically stable Outcome: Completed/Met Date Met:  11/20/14 Goal: Other Phase I Outcomes/Goals Outcome: Completed/Met Date Met:  11/20/14  Problem: Phase II Progression Outcomes Goal: Progress activity as tolerated unless otherwise ordered Outcome: Completed/Met Date Met:  11/20/14 Goal: Vital signs stable Outcome: Completed/Met Date Met:  11/20/14 Goal: Return of bowel function (flatus, BM) IF ABDOMINAL SURGERY:  Outcome: Completed/Met Date Met:  11/20/14

## 2014-11-21 DIAGNOSIS — I481 Persistent atrial fibrillation: Secondary | ICD-10-CM

## 2014-11-21 DIAGNOSIS — R0902 Hypoxemia: Secondary | ICD-10-CM

## 2014-11-21 MED ORDER — IPRATROPIUM-ALBUTEROL 0.5-2.5 (3) MG/3ML IN SOLN: 3.0000 mL | Freq: Four times a day (QID) | RESPIRATORY_TRACT | Status: DC

## 2014-11-21 MED ORDER — OXYCODONE-ACETAMINOPHEN 5-325 MG PO TABS: 1.0000 | ORAL_TABLET | ORAL | Status: DC | PRN

## 2014-11-21 MED ORDER — IPRATROPIUM-ALBUTEROL 20-100 MCG/ACT IN AERS: 1.0000 | INHALATION_SPRAY | Freq: Four times a day (QID) | RESPIRATORY_TRACT | Status: DC

## 2014-11-21 MED ORDER — TRAMADOL HCL 50 MG PO TABS: 50.0000 mg | ORAL_TABLET | Freq: Four times a day (QID) | ORAL | Status: DC | PRN

## 2014-11-21 NOTE — Discharge Summary (Signed)
Physician Discharge Summary  Patient ID: Kristine Simmons MRN: [id number] DOB/AGE: 1927/04/20 80 y.o.  Admit date: 11/10/2014 Discharge date: 11/21/2014  Admission Diagnoses:  1.  Recurrent RIH s/p open repair with mesh 08/17/14, Dr. Dalbert Batman. 2. Anticoagulated Off Eliquis since Thursday, 11/10/2104 3. A. Fib. 4. History of CVA 5. Left eve vision loss 6. COPD - has been on and off O2  Discharge Diagnoses:  1. Recurrent right femoral hernia, weakness lateral inguinal floor (direct defect)   2. Anticoagulated Off Eliquis since Thursday, 11/10/2104 3. A. Fib. 4. History of CVA 5. Left eve vision loss 6. COPD - has been on and off O2/post op hypoxia requiring O2/transferred to ICU 11/17/14. 7. DVT prophylaxis -  8. Hypotension - probably related to narcotic.   Principal Problem:   Recurrent inguinal hernia with obstruction Active Problems:   Recurrent femoral hernia with obstruction without gangrene   Atrial fibrillation   Chronic anticoagulation   History of CVA (cerebrovascular accident)   Hypothyroidism   Hypoxemia   PROCEDURES:  LAPAROSCOPIC RIGHT INGUINAL/femoral HERNIA repair WITH  MESH -11/15/2014 - [facility]. [facility] Course:  Pleasant elderly woman anticoagulated for chronic atrial fibrillation with history of prior stroke and left eye vision lost. History of COPD with recent oxygen requirements. She developed incarcerated inguinal and femoral hernia. Urgently reduced. Anticoagulation held. Cleared by cardiology with recent echocardiogram showing good ejection fraction around 60%. Medicine help follow. Underwent open repair with mesh. This was in late August. Struggled with some postoperative hypoxemia. Went home on home oxygen. Stabilized and removed off this by her primary care physician the next month. Seen in surgery office recovering. She finally started feeling much better this month.  However, she  noticed a sharp pain and bulging at 7:00 tonight. Concern for recurrent hernia. Came in the hospital. Seen by emergency team. Exam concerning for hernia. CAT scan done concerning for hernia. Initially not able to be reduced but on second attempt was able to reduce hernia. Patient feeling better now. She did have some periumbilical abdominal pain and discomfort - much less after hernia reduction. She still feels somewhat distended. She did have a bowel movement earlier this morning but no flatus or bowel movement since the episode of incarceration. Husband at bedside. She is concerned about a recurrent hernia & concerned about oxygen needs and possible surgery.  Her hernia was reduced in the ED, and did not reoccur before her surgery.  She was admitted and kept off anticoagulation till surgery  on 11/15/14.  Post op she had some hypotension, and hypoxia, she was transferred to the ICU, and she improved with O2 therapy.  From a surgical standpoint she did well.  A little slow with recovery and pain, but not for her age.  She was mobilized and her diet was increased, but she continues to have issues with low O2 saturations.  She is making good progress by 11/21/14.  It seems she has home O2 but does not like to use it.  We have ask Pulmonary to see and they recommend O2 at 3 liter per min.  Follow up with CCM Tammy Parrett/Dr. Lake Bells.  Combivent at 1 puff QID.  Follow up with Dr. Lucia Gaskins in 2-3 weeks.   Condition on d/c:  Improving  CMP     Component Value Date/Time   NA 133* 11/20/2014 0535   K 4.1 11/20/2014 0535   CL 90* 11/20/2014 0535   CO2 34* 11/20/2014 0535   GLUCOSE 98 11/20/2014 0535  BUN 11 11/20/2014 0535   CREATININE 0.87 11/20/2014 0535   CALCIUM 8.9 11/20/2014 0535   PROT 5.4* 08/15/2014 0455   ALBUMIN 3.1* 08/15/2014 0455   AST 25 08/15/2014 0455   ALT 17 08/15/2014 0455   ALKPHOS 38* 08/15/2014 0455   BILITOT 0.8 08/15/2014 0455   GFRNONAA 59* 11/20/2014 0535   GFRAA  68* 11/20/2014 0535   CBC Latest Ref Rng 11/20/2014 11/18/2014 11/17/2014  WBC 4.0 - 10.5 K/uL 8.4 9.5 8.1  Hemoglobin 12.0 - 15.0 g/dL 13.9 15.2(H) 12.7  Hematocrit 36.0 - 46.0 % 41.1 45.3 39.6  Platelets 150 - 400 K/uL 206 195 172      Disposition: 06-Home-Health Care Svc      Discharge Instructions    For home use only DME oxygen    Complete by:  As directed   Mode or (Route):  Nasal cannula  Liters per Minute:  3  Frequency:  Continuous (stationary and portable oxygen unit needed)  Oxygen conserving device:  Yes  Oxygen delivery system:  Gas            Medication List    STOP taking these medications        diphenhydramine-acetaminophen 25-500 MG Tabs  Commonly known as:  TYLENOL PM      TAKE these medications        acetaminophen 325 MG tablet  Commonly known as:  TYLENOL  You can take 2 tablets every 4 hours as needed.  You cannot take more than 4000 mg of tylenol per day.  This is also in your sleep aide, and it is in your prescribed pain medicine. So you need to figure out.  Do not take more than 12 tablets of tylenol (acetaminophen) per day.     apixaban 2.5 MG Tabs tablet  Commonly known as:  ELIQUIS  Take 1 tablet (2.5 mg total) by mouth 2 (two) times daily.     bimatoprost 0.03 % ophthalmic solution  Commonly known as:  LUMIGAN  Place 1 drop into both eyes at bedtime.     calcium carbonate 500 MG chewable tablet  Commonly known as:  TUMS - dosed in mg elemental calcium  Chew 2 tablets by mouth daily as needed for indigestion or heartburn (indigestion).     calcium-vitamin D 500-200 MG-UNIT per tablet  Take 1 tablet by mouth daily.     CRANBERRY CONCENTRATE 500 MG Caps  Generic drug:  Cranberry  Take 2 capsules by mouth daily.     digoxin 0.125 MG tablet  Commonly known as:  LANOXIN  Take 0.125 mg by mouth at bedtime.     diltiazem 120 MG 24 hr capsule  Commonly known as:  CARDIZEM CD  Take 120 mg by mouth daily.     ENSURE PLUS Liqd   Take 1 Can by mouth daily.     estradiol 0.1 MG/GM vaginal cream  Commonly known as:  ESTRACE  Place 2 g vaginally. Twice weekly     Ipratropium-Albuterol 20-100 MCG/ACT Aers respimat  Commonly known as:  COMBIVENT  Inhale 1 puff into the lungs every 6 (six) hours.     levothyroxine 50 MCG tablet  Commonly known as:  SYNTHROID, LEVOTHROID  Take 50 mcg by mouth daily. On Weds ONLY takes 1 1/2 tabs.     LORazepam 1 MG tablet  Commonly known as:  ATIVAN  Take 0.5 mg by mouth every 8 (eight) hours as needed for anxiety or sleep (sleep).     Magnesium  Oxide 500 MG Tabs  Take 1 tablet by mouth at bedtime.     Misc Intestinal Flora Regulat Caps  Take 1 capsule by mouth daily.     sertraline 50 MG tablet  Commonly known as:  ZOLOFT  Take 1 tablet (50 mg total) by mouth daily.     timolol 0.5 % ophthalmic solution  Commonly known as:  TIMOPTIC  Place 1 drop into both eyes 2 (two) times daily.     traMADol 50 MG tablet  Commonly known as:  ULTRAM  Take 1-2 tablets (50-100 mg total) by mouth every 6 (six) hours as needed for moderate pain or severe pain.     trimethoprim 100 MG tablet  Commonly known as:  TRIMPEX  Take 100 mg by mouth every other day.     Vitamin B Complex Tabs  Take 1 tablet by mouth daily.     Vitamin D 2000 UNITS tablet  Take 2,000 Units by mouth daily.     VYTORIN 10-40 MG per tablet  Generic drug:  ezetimibe-simvastatin  TAKE 1 TABLET ONCE DAILY.       Follow-up Information    Follow up with PARRETT,TAMMY, NP On 11/25/2014.   Specialty:  Nurse Practitioner   Why:  Appt at 10:15 am.     Contact information:   [address] [address] [phone]       Follow up with [facility], MD. Schedule an appointment as soon as possible for a visit in 2 weeks.   Specialty:  General Surgery   Why:  Make an appointment for 2-3 weeks to follow up with Dr. Lucia Gaskins.   Contact information:   [address] [place]  [address] [phone]       Signed: Earnstine Regal 11/21/2014, 1:53 PM

## 2014-11-21 NOTE — Telephone Encounter (Signed)
Spoke with Theadora Rama, pt is being discharged today, needs hosp follow up.  BQ had no 30 minute openings until 12/22, scheduled for Friday with tp.  Order for pft placed.  Nothing further needed at this time.

## 2014-11-21 NOTE — Progress Notes (Signed)
6 Days Post-Op  Subjective: She feels good and wants to go home  Objective: Vital signs in last 24 hours: Temp:  [97.5 F (36.4 C)-98.3 F (36.8 C)] 97.7 F (36.5 C) (11/30 0614) Pulse Rate:  [62-66] 66 (11/30 0614) Resp:  [16] 16 (11/30 0614) BP: (103-111)/(58-62) 106/62 mmHg (11/30 0614) SpO2:  [91 %-95 %] 91 % (11/30 0614) Last BM Date: 11/20/14  Intake/Output from previous day: 11/29 0701 - 11/30 0700 In: 1080 [P.O.:1080] Out: 2601 [Urine:2600; Stool:1] Intake/Output this shift: Total I/O In: -  Out: 200 [Urine:200]  Resp: clear to auscultation bilaterally Cardio: regular rate and rhythm GI: soft, mild tenderness. good bs. incisions ok  Lab Results:   Recent Labs  11/20/14 0535  WBC 8.4  HGB 13.9  HCT 41.1  PLT 206   BMET  Recent Labs  11/20/14 0535  NA 133*  K 4.1  CL 90*  CO2 34*  GLUCOSE 98  BUN 11  CREATININE 0.87  CALCIUM 8.9   PT/INR No results for input(s): LABPROT, INR in the last 72 hours. ABG No results for input(s): PHART, HCO3 in the last 72 hours.  Invalid input(s): PCO2, PO2  Studies/Results: No results found.  Anti-infectives: Anti-infectives    Start     Dose/Rate Route Frequency Ordered Stop   11/15/14 1015  ceFAZolin (ANCEF) IVPB 2 g/50 mL premix     2 g100 mL/hr over 30 Minutes Intravenous  Once 11/15/14 1005 11/15/14 1010   11/13/14 0800  ceFAZolin (ANCEF) IVPB 2 g/50 mL premix     2 g100 mL/hr over 30 Minutes Intravenous On call to O.R. 11/13/14 0736 11/14/14 0559   11/11/14 1000  trimethoprim (TRIMPEX) tablet 100 mg     100 mg Oral Every 48 hours 11/11/14 0214        Assessment/Plan: s/p Procedure(s): LAPAROSCOPIC RIGHT INGUINAL HERNIA WITH MESH (Right) Will see how she does off O2. if she can maintain in the mid 80's or higher then she may be able to go home. If not then she may have to go to snf  LOS: 11 days    TOTH III,Kelby Adell S 11/21/2014

## 2014-11-21 NOTE — Evaluation (Signed)
Occupational Therapy Evaluation Patient Details Name: Kristine Simmons MRN: [id number] DOB: [birth year] Today's Date: 11/21/2014    History of Present Illness Pt is an 80 year old female hx of COPD presented 11/19 with abdominal pain s/p Right lap inguinal hernia with mesh 39/76 and course complicated 73/41 by SOB and hypoxia.   Clinical Impression   Pt admitted with SOB. Pt currently with functional limitations due to the deficits listed below (see OT Problem List).  Pt will benefit from skilled OT to increase their safety and independence with ADL and functional mobility for ADL to facilitate discharge to venue listed below.      Follow Up Recommendations  Home health OT;Supervision/Assistance - 24 hour    Equipment Recommendations  None recommended by OT       Precautions / Restrictions Precautions Precaution Comments: monitor sats      Mobility Bed Mobility Overal bed mobility: Modified Independent                Transfers Overall transfer level: Needs assistance Equipment used: 1 person hand held assist Transfers: Sit to/from Stand Sit to Stand: Min guard         General transfer comment: pt requested HHA, min/guard for safety         ADL Overall ADL's : Needs assistance/impaired     Grooming: Set up;Sitting   Upper Body Bathing: Set up;Sitting   Lower Body Bathing: Sit to/from stand;Min guard   Upper Body Dressing : Set up;Sitting   Lower Body Dressing: Sit to/from stand;Min guard   Toilet Transfer: Min guard;BSC   Toileting- Clothing Manipulation and Hygiene: Minimal assistance;Sit to/from stand         General ADL Comments: Educated pt in safety with oxygen home. Pt will benefit from [facility] to address safety in her home with oxygen cord               Pertinent Vitals/Pain Pain Assessment: No/denies pain     Hand Dominance     Extremity/Trunk Assessment Upper Extremity Assessment Upper Extremity Assessment: Generalized weakness    Lower Extremity Assessment Lower Extremity Assessment: Overall WFL for tasks assessed       Communication Communication Communication: No difficulties   Cognition Arousal/Alertness: Awake/alert Behavior During Therapy: WFL for tasks assessed/performed Overall Cognitive Status: Within Functional Limits for tasks assessed                     General Comments   pt agreeable to oxygen at home            [place] expects to be discharged to:: Private residence Living Arrangements: Spouse/significant other   Type of Home: House Home Access: Stairs to enter [employer] of Steps: 1 x2   Home Layout: One level     Bathroom Shower/Tub: Walk-in shower         Home Equipment: Kasandra Knudsen - single point;Walker - 2 wheels;Grab bars - tub/shower          Prior Functioning/Environment Level of Independence: Needs assistance  Gait / Transfers Assistance Needed: holds onto objects and/or spouse's arm ADL's / Homemaking Assistance Needed: spouse assists fue to vision        OT Diagnosis: Generalized weakness   OT Problem List: Decreased strength;Decreased activity tolerance   OT Treatment/Interventions: Self-care/ADL training;Energy conservation;Patient/family education    OT Goals(Current goals can be found in the care plan section) Acute Rehab OT Goals Patient Stated Goal: go home today OT Goal Formulation:  With patient Time For Goal Achievement: 11/28/14 Potential to Achieve Goals: Good ADL Goals Pt Will Perform Grooming: with supervision;standing Pt Will Transfer to Toilet: with supervision;ambulating;regular height toilet Pt Will Perform Toileting - Clothing Manipulation and hygiene: with supervision;sit to/from stand  OT Frequency: Min 2X/week              End of Session Nurse Communication: Mobility status  Activity Tolerance: Patient tolerated treatment well Patient left: in bed;with call bell/phone within reach;with  family/visitor present   Time: 5465-6812 OT Time Calculation (min): 28 min Charges:  OT General Charges $OT Visit: 1 Procedure OT Evaluation $Initial OT Evaluation Tier I: 1 Procedure OT Treatments $Self Care/Home Management : 8-22 mins G-Codes:    Payton Mccallum D 2014-11-25, 11:39 AM

## 2014-11-21 NOTE — Progress Notes (Signed)
S:  Called back to see patient regarding home O2 needs  O:  Filed Vitals:   11/20/14 2110 11/21/14 0614 11/21/14 0900 11/21/14 0939  BP: 111/58 106/62  115/60  Pulse: 62 66    Temp: 98.3 F (36.8 C) 97.7 F (36.5 C)    TempSrc: Oral Oral    Resp:  16    Height:      Weight:      SpO2: 95% 91% 83%     Recent Labs Lab 11/17/14 0440 11/18/14 0341 11/20/14 0535  HGB 12.7 15.2* 13.9  HCT 39.6 45.3 41.1  WBC 8.1 9.5 8.4  PLT 172 195 206    Recent Labs Lab 11/17/14 0440 11/18/14 0341 11/20/14 0535  NA 130* 131* 133*  K 5.0 4.3 4.1  CL 96 87* 90*  CO2 28 31 34*  GLUCOSE 99 128* 98  BUN 12 10 11   CREATININE 0.93 0.77 0.87  CALCIUM 8.9 8.9 8.9   EXAM: General: wdwn elderly female in NAD Neuro: AAO CV: s1s2 rrr PULM: resp's even/non-labored, lungs clear GI: NTND Extremities: wd, no edema   A: Hypoxemia - likely multifactorial in setting of pulmonary edema, not defined component of COPD and post-op surgical immobility.   Acute Hypoxemic Respiratory Failure in setting of Pulmonary Edema - resolved with diuresis Former Smoker - remote, quit 20 + years ago  P: Home O2 at 3L, ordered for discharge Arranged for pulmonary follow up as outpatient with Rexene Edison, NP + will need to see Dr. Lake Bells at later date Combivent 1 puff Q6 PRN PFT's as outpatient  Mobilize, IS Consider 6 min walk test as outpatient Prior home O2 use in 07/2014 and was taken off after hospitalization   Chronic Atrial Fibrillation Doubt CHF based on 05/2014 echo; Suspect that the pulmonary edema came from aortic stenosis with afib after volume resuscitation post op Chest pain  P: Needs outpatient f/u with Dr. Percival Spanish On cardizem, digoxin + Eliquis prior to admission   Noe Gens, NP-C [place] Pgr: [phone] or [phone]   Attending:  I have seen and examined the patient with nurse practitioner/resident and agree with the note above.   Her lungs are  clear on my exam, suspect this is a combination of COPD, atelectasis and perhaps some edema. Notably, she has been on oxygen related to COPD recently, so it doesn't surprise me she has remained on oxygen again here.  Plan d/c on 3L [place] and f/u with our clinic on Monday  Roselie Awkward, MD [place] [facility] Pager: [phone] Cell: [phone] If no response, call [phone]

## 2014-11-21 NOTE — Evaluation (Signed)
Physical Therapy Evaluation Patient Details Name: Kristine Simmons MRN: [id number] DOB: Dec 18, 1927 Today's Date: 11/21/2014   History of Present Illness  Pt is an 80 year old female hx of COPD presented 11/19 with abdominal pain s/p Right lap inguinal hernia with mesh 23/53 and course complicated 61/44 by SOB and hypoxia.  Clinical Impression  Pt currently with functional limitations due to the deficits listed below (see PT Problem List).  Pt will benefit from skilled PT to increase their independence and safety with mobility to allow discharge to the venue listed below.   Pt ambulated in hallway with 1 HHA as she states she can ambulate with spouse at home.  Pt encouraged to use RW for safety at home especially if spouse not present.  Pt reports no SOB during activity however SpO2 83% on room air upon return to room after gait.  Pt reports she would really like to not require oxygen at home.  Pt does have portable pulse ox which she owns presently in room and reports her baseline is usually high 80s room air.  SATURATION QUALIFICATIONS: (This note is used to comply with regulatory documentation for home oxygen)  Patient Saturations on Room Air at Rest = 90%  Patient Saturations on Room Air while Ambulating = 83%  Patient Saturations on ___ Liters of oxygen while Ambulating =  Pt declined.  Please briefly explain why patient needs home oxygen: to maintain oxygen saturations above 88% during functional activity such as ambulation.  Pt declined applying oxygen.  86% room air upon leaving room.  Reapplied continuous pulse ox and recommended pt reapply O2 [place] if pulse ox monitor beeping.  RN aware of saturations.      Follow Up Recommendations No PT follow up    Equipment Recommendations  None recommended by PT    Recommendations for Other Services       Precautions / Restrictions Precautions Precaution Comments: monitor sats Restrictions Weight Bearing Restrictions: No       Mobility  Bed Mobility Overal bed mobility: Modified Independent                Transfers Overall transfer level: Needs assistance Equipment used: 1 person hand held assist Transfers: Sit to/from Stand Sit to Stand: Min guard         General transfer comment: pt requested HHA, min/guard for safety  Ambulation/Gait Ambulation/Gait assistance: Min guard Ambulation Distance (Feet): 400 Feet Assistive device: 1 person hand held assist Gait Pattern/deviations: Step-through pattern     General Gait Details: pt with LLD after previous hip surgery, typically wears shoe lift, no SOB reported however SpO2 dropped to 87-90% room air during gait and 83% room air upon return to room, a couple short standing rest breaks in attempt to improve SpO2 with breathing technique since pt wishes to not need oxygen upon return home  Stairs            Wheelchair Mobility    Modified Rankin (Stroke Patients Only)       Balance                                             Pertinent Vitals/Pain Pain Assessment: No/denies pain    Home Living Family/patient expects to be discharged to:: Private residence Living Arrangements: Spouse/significant other   Type of Home: House Home Access: Stairs to enter   Entrance  Stairs-Number of Steps: 1 x2 Home Layout: One level Home Equipment: Cane - single point;Walker - 2 wheels      Prior Function Level of Independence: Needs assistance   Gait / Transfers Assistance Needed: holds onto objects and/or spouse's arm           Hand Dominance        Extremity/Trunk Assessment               Lower Extremity Assessment: Overall WFL for tasks assessed         Communication   Communication: No difficulties  Cognition Arousal/Alertness: Awake/alert Behavior During Therapy: WFL for tasks assessed/performed Overall Cognitive Status: Within Functional Limits for tasks assessed                       General Comments      Exercises        Assessment/Plan    PT Assessment Patient needs continued PT services  PT Diagnosis Difficulty walking   PT Problem List Decreased strength;Decreased activity tolerance;Decreased mobility;Cardiopulmonary status limiting activity;Decreased knowledge of use of DME  PT Treatment Interventions DME instruction;Gait training;Functional mobility training;Therapeutic activities;Therapeutic exercise;Patient/family education   PT Goals (Current goals can be found in the Care Plan section) Acute Rehab PT Goals PT Goal Formulation: With patient Time For Goal Achievement: 11/25/14 Potential to Achieve Goals: Good    Frequency Min 3X/week   Barriers to discharge        Co-evaluation               End of Session   Activity Tolerance: Patient tolerated treatment well Patient left: in bed;with call bell/phone within reach;with family/visitor present           Time: 0076-2263 PT Time Calculation (min) (ACUTE ONLY): 17 min   Charges:   PT Evaluation $Initial PT Evaluation Tier I: 1 Procedure PT Treatments $Gait Training: 8-22 mins   PT G Codes:          Kydan Shanholtzer,KATHrine E 11/21/2014, 10:22 AM Carmelia Bake, PT, DPT 11/21/2014 Pager: [phone]

## 2014-11-21 NOTE — Progress Notes (Signed)
Patient oxygen level at 83% on room air while in bed.  Patient placed back on oxygen at 2 liters per nasal cannula. Oxygen level increased to 91%.

## 2014-11-21 NOTE — Discharge Instructions (Signed)
CCS _______[facility] ° °UMBILICAL OR INGUINAL HERNIA REPAIR: POST OP INSTRUCTIONS ° °Always review your discharge instruction sheet given to you by the facility where your surgery was performed. °IF YOU HAVE DISABILITY OR FAMILY LEAVE FORMS, YOU MUST BRING THEM TO THE OFFICE FOR PROCESSING.   °DO NOT GIVE THEM TO YOUR DOCTOR. ° °1. A  prescription for pain medication may be given to you upon discharge.  Take your pain medication as prescribed, if needed.  If narcotic pain medicine is not needed, then you may take acetaminophen (Tylenol) or ibuprofen (Advil) as needed. °2. Take your usually prescribed medications unless otherwise directed. °3. If you need a refill on your pain medication, please contact your pharmacy.  They will contact our office to request authorization. Prescriptions will not be filled after 5 pm or on week-ends. °4. You should follow a light diet the first 24 hours after arrival home, such as soup and crackers, etc.  Be sure to include lots of fluids daily.  Resume your normal diet the day after surgery. °5. Most patients will experience some swelling and bruising around the umbilicus or in the groin and scrotum.  Ice packs and reclining will help.  Swelling and bruising can take several days to resolve.  °6. It is common to experience some constipation if taking pain medication after surgery.  Increasing fluid intake and taking a stool softener (such as Colace) will usually help or prevent this problem from occurring.  A mild laxative (Milk of Magnesia or Miralax) should be taken according to package directions if there are no bowel movements after 48 hours. °7. Unless discharge instructions indicate otherwise, you may remove your bandages 24-48 hours after surgery, and you may shower at that time.  You may have steri-strips (small skin tapes) in place directly over the incision.  These strips should be left on the skin for 7-10 days.  If your surgeon used skin glue on the  incision, you may shower in 24 hours.  The glue will flake off over the next 2-3 weeks.  Any sutures or staples will be removed at the office during your follow-up visit. °8. ACTIVITIES:  You may resume regular (light) daily activities beginning the next day--such as daily self-care, walking, climbing stairs--gradually increasing activities as tolerated.  You may have sexual intercourse when it is comfortable.  Refrain from any heavy lifting or straining until approved by your doctor. °a. You may drive when you are no longer taking prescription pain medication, you can comfortably wear a seatbelt, and you can safely maneuver your car and apply brakes. °b. RETURN TO WORK:  __________________________________________________________ °9. You should see your doctor in the office for a follow-up appointment approximately 2-3 weeks after your surgery.  Make sure that you call for this appointment within a day or two after you arrive home to insure a convenient appointment time. °10. OTHER INSTRUCTIONS:  __________________________________________________________________________________________________________________________________________________________________________________________  °WHEN TO CALL YOUR DOCTOR: °1. Fever over 101.0 °2. Inability to urinate °3. Nausea and/or vomiting °4. Extreme swelling or bruising °5. Continued bleeding from incision. °6. Increased pain, redness, or drainage from the incision ° °The clinic staff is available to answer your questions during regular business hours.  Please don’t hesitate to call and ask to speak to one of the nurses for clinical concerns.  If you have a medical emergency, go to the nearest emergency room or call 911.  A surgeon from [facility] is always on call at the hospital ° ° °  [address], [place], [place], [place]  [address] ?  [address], [place], [place]   [address] [phone] ? [phone] ? FAX [phone] Web site:  [email or web address]  Home oxygen at 3 liters per minute.  Follow up with Pulmonary, list above.

## 2014-11-21 NOTE — Care Management Note (Signed)
    Page 1 of 2   11/21/2014     2:21:12 PM CARE MANAGEMENT NOTE 11/21/2014  Patient:  NANEA, JARED   Account Number:  [id number]  Date Initiated:  11/11/2014  Documentation initiated by:  DAVIS,RHONDA  Subjective/Objective Assessment:   Recurrent femoral/inguinal hernia with obstruction without gangrene.  Open repair of right femoral hernia with Mesh 08/17/14  Dr. Dalbert Batman  Atrial fibrillation on Eliquis, last dose PM 11/10/14.  Hx of CVA  Hypothyroid  Blind OS  Hyperlipidemi     Action/Plan:   home when stable   Anticipated DC Date:  11/14/2014   Anticipated DC Plan:  HOME/SELF CARE  In-house referral  NA      DC Planning Services  CM consult      PAC Choice  DURABLE MEDICAL EQUIPMENT   Choice offered to / List presented to:  NA   DME arranged  OXYGEN      DME agency  [place] agency  NA   Status of service:  Completed, signed off Medicare Important Message given?  YES (If response is "NO", the following Medicare IM given date fields will be blank) Date Medicare IM given:  11/21/2014 Medicare IM given by:  [facility] Date Additional Medicare IM given:   Additional Medicare IM given by:    Discharge Disposition:  HOME/SELF CARE  Per UR Regulation:  Reviewed for med. necessity/level of care/duration of stay  If discussed at [place] of Stay Meetings, dates discussed:    Comments:  11-21-14 Sunday Spillers RN CM Patient agreeable to home O2, contacted [facility] to arrange for home and transport tank.  [id number] Davis,Rn,BSn,CCM

## 2014-11-22 ENCOUNTER — Other Ambulatory Visit: Payer: Self-pay | Admitting: Internal Medicine

## 2014-11-23 NOTE — Telephone Encounter (Signed)
MD out of office  10.1.15 last office visit  Okay to refill one time?

## 2014-11-23 NOTE — Telephone Encounter (Signed)
Lorazepam has been faxed to [place]

## 2014-11-23 NOTE — Telephone Encounter (Signed)
Done hardcopy to robin  

## 2014-11-25 ENCOUNTER — Ambulatory Visit (INDEPENDENT_AMBULATORY_CARE_PROVIDER_SITE_OTHER)
Admission: RE | Admit: 2014-11-25 | Discharge: 2014-11-25 | Disposition: A | Discharge status: Home / Self Care | Payer: Medicare Other | Source: Ambulatory Visit | Attending: Adult Health | Admitting: Adult Health

## 2014-11-25 ENCOUNTER — Encounter: Payer: Self-pay | Admitting: Adult Health

## 2014-11-25 ENCOUNTER — Ambulatory Visit (INDEPENDENT_AMBULATORY_CARE_PROVIDER_SITE_OTHER): Payer: Medicare Other | Admitting: Adult Health

## 2014-11-25 ENCOUNTER — Ambulatory Visit
Admission: RE | Admit: 2014-11-25 | Discharge: 2014-11-25 | Disposition: A | Discharge status: Home / Self Care | Payer: Medicare Other | Source: Ambulatory Visit | Attending: Adult Health | Admitting: Adult Health

## 2014-11-25 VITALS — BP 102/60 | HR 71 | Temp 98.3°F | Ht 67.5 in | Wt 128.0 lb

## 2014-11-25 DIAGNOSIS — J441 Chronic obstructive pulmonary disease with (acute) exacerbation: Secondary | ICD-10-CM

## 2014-11-25 DIAGNOSIS — J90 Pleural effusion, not elsewhere classified: Secondary | ICD-10-CM

## 2014-11-25 DIAGNOSIS — R0602 Shortness of breath: Secondary | ICD-10-CM

## 2014-11-25 DIAGNOSIS — R0902 Hypoxemia: Secondary | ICD-10-CM

## 2014-11-25 NOTE — Patient Instructions (Signed)
Continue on current regimen  Chest xray today  Follow up Dr. Lake Bells in 4-6 weeks with PFT  Please contact office for sooner follow up if symptoms do not improve or worsen or seek emergency care

## 2014-11-25 NOTE — Progress Notes (Signed)
   Subjective:    Patient ID: Kristine Simmons, female    DOB: 1927/02/25, 80 y.o.   MRN: [id number]  HPI 80 yo female with COPD on home O2 seen in the hospital for pulmonary consult for postop complications with hypoxia Patient underwent a right inguinal hernia repair on November 15, 2014.  11/25/14   [facility] follow up  Patient returns for a post hospital follow-up. He was seen in pulmonary consultation during her hospitalization for postop complications after a right inguinal hernia repair. Patient had increased shortness of breath and hypoxia postop. Felt multifactoral with pulmonary edema , that improved with diuresis .  Reports breathing is improving everyday.  still having some DOE Was discharged on O2.  And combivent As needed   Was a former smoker w/ 1 PPD 43 yr , quit 1995.  No formal COPD dx . No PFT in past .    Review of Systems Constitutional:   No  weight loss, night sweats,  Fevers, chills,  +fatigue, or  lassitude.  HEENT:   No headaches,  Difficulty swallowing,  Tooth/dental problems, or  Sore throat,                No sneezing, itching, ear ache, nasal congestion, post nasal drip,   CV:  No chest pain,  Orthopnea, PND, swelling in lower extremities, anasarca, dizziness, palpitations, syncope.   GI  No heartburn, indigestion, abdominal pain, nausea, vomiting, diarrhea, change in bowel habits, loss of appetite, bloody stools.   Resp:  .  No chest wall deformity  Skin: no rash or lesions.  GU: no dysuria, change in color of urine, no urgency or frequency.  No flank pain, no hematuria   MS:  No joint pain or swelling.  No decreased range of motion.  No back pain.  Psych:  No change in mood or affect. No depression or anxiety.  No memory loss.         Objective:   Physical Exam GEN: A/Ox3; pleasant , NAD, elderly   HEENT:  [place]/AT,  EACs-clear, TMs-wnl, NOSE-clear, THROAT-clear, no lesions, no postnasal drip or exudate noted.   NECK:  Supple w/ fair  ROM; no JVD; normal carotid impulses w/o bruits; no thyromegaly or nodules palpated; no lymphadenopathy.  RESP  Decreased BS in bases .no accessory muscle use, no dullness to percussion  CARD:  RRR, no m/r/g  , no peripheral edema, pulses intact, no cyanosis or clubbing.  GI:   Soft & nt; nml bowel sounds; no organomegaly or masses detected.  Musco: Warm bil, no deformities or joint swelling noted.   Neuro: alert, no focal deficits noted.    Skin: Warm, no lesions or rashes        Assessment & Plan:

## 2014-11-28 NOTE — Assessment & Plan Note (Signed)
Recent decompensation post op with pulmonary edema  Needs workup for COPD w/ PFT   Plan  Continue on current regimen  Chest xray today  Follow up Dr. Lake Bells in 4-6 weeks with PFT  Please contact office for sooner follow up if symptoms do not improve or worsen or seek emergency care

## 2014-12-07 ENCOUNTER — Other Ambulatory Visit: Payer: Self-pay | Admitting: Cardiology

## 2014-12-07 ENCOUNTER — Other Ambulatory Visit: Payer: Self-pay | Admitting: Internal Medicine

## 2014-12-08 ENCOUNTER — Telehealth: Payer: Self-pay

## 2014-12-08 NOTE — Telephone Encounter (Signed)
Phone call from physical therapist with [facility]. She wanted to make you are aware that patient was re-admitted to the hospital 11/15/14 for a recurrent inguinal hernia on the right and had to have a repeat hernia repair with mesh done laparoscopic. She has exacerbation of copd and placed on oxygen. Dr Alphonsa Overall ordered home physical therapy. Dr Lucia Gaskins is not comfortable managing the oxgyen and asked to contact Dr Linna Darner to manage/report oxygen issues to. Patient is declining to use oxygen. Please advise.

## 2014-12-08 NOTE — Telephone Encounter (Signed)
Phone call back to [place], [place] with [place]. She states patient's pulmonology consult is the third week in January. Per Dr Linna Darner okay if he needs to be consulted until her appointment with pulmonology.

## 2014-12-08 NOTE — Telephone Encounter (Signed)
I recommend Pulmonary consult if she won't wear O2 O2 sats need to be > 90% to prevent cardiac issues

## 2014-12-12 ENCOUNTER — Telehealth: Payer: Self-pay | Admitting: Internal Medicine

## 2014-12-12 NOTE — Telephone Encounter (Signed)
Joann called from [place] to ask if pt should be taking 1/2 tab of her zoloft generic or 1 whole tab. Pt stopped taking for a few days then started taking again, taking one whole tablet (50 mg). Nurse wants to know which dosage is correct  [phone]

## 2014-12-12 NOTE — Telephone Encounter (Signed)
1/2 qd is safest to prevent Serotonin syndrome

## 2014-12-12 NOTE — Telephone Encounter (Signed)
Kristine Simmons has been advised of Dr Linna Darner response.

## 2014-12-20 ENCOUNTER — Other Ambulatory Visit: Payer: Self-pay

## 2014-12-20 MED ORDER — ENSURE PLUS PO LIQD: 1.0000 | Freq: Every day | ORAL | Status: DC

## 2015-01-02 ENCOUNTER — Ambulatory Visit (INDEPENDENT_AMBULATORY_CARE_PROVIDER_SITE_OTHER): Payer: Medicare Other | Admitting: Pulmonary Disease

## 2015-01-02 ENCOUNTER — Encounter: Payer: Self-pay | Admitting: Pulmonary Disease

## 2015-01-02 VITALS — BP 124/62 | HR 61 | Ht 67.5 in | Wt 124.0 lb

## 2015-01-02 DIAGNOSIS — J441 Chronic obstructive pulmonary disease with (acute) exacerbation: Secondary | ICD-10-CM

## 2015-01-02 DIAGNOSIS — J438 Other emphysema: Secondary | ICD-10-CM

## 2015-01-02 LAB — PULMONARY FUNCTION TEST
DL/VA % pred: 37 %
DL/VA: 1.96 ml/min/mmHg/L
DLCO UNC: 9.01 ml/min/mmHg
DLCO unc % pred: 31 %
FEF 25-75 POST: 0.91 L/s
FEF 25-75 Pre: 0.84 L/sec
FEF2575-%Change-Post: 8 %
FEF2575-%Pred-Post: 70 %
FEF2575-%Pred-Pre: 64 %
FEV1-%Change-Post: 1 %
FEV1-%PRED-POST: 88 %
FEV1-%Pred-Pre: 87 %
FEV1-PRE: 1.81 L
FEV1-Post: 1.83 L
FEV1FVC-%Change-Post: -2 %
FEV1FVC-%Pred-Pre: 80 %
FEV6-%CHANGE-POST: 3 %
FEV6-%Pred-Post: 117 %
FEV6-%Pred-Pre: 113 %
FEV6-POST: 3.09 L
FEV6-Pre: 2.97 L
FEV6FVC-%Change-Post: 0 %
FEV6FVC-%PRED-POST: 103 %
FEV6FVC-%PRED-PRE: 103 %
FVC-%Change-Post: 3 %
FVC-%Pred-Post: 114 %
FVC-%Pred-Pre: 110 %
FVC-Post: 3.19 L
FVC-Pre: 3.07 L
POST FEV1/FVC RATIO: 58 %
POST FEV6/FVC RATIO: 97 %
Pre FEV1/FVC ratio: 59 %
Pre FEV6/FVC Ratio: 97 %
RV % pred: 99 %
RV: 2.68 L
TLC % PRED: 103 %
TLC: 5.78 L

## 2015-01-02 NOTE — Patient Instructions (Signed)
Use the combivent as needed for shortness of breath We will arrange a overnight oximetry test on room air and call you with the results We will see you back in 6 months or sooner if needed

## 2015-01-02 NOTE — Progress Notes (Signed)
Subjective:    Patient ID: Kristine Simmons, female    DOB: 1927-11-29, 81 y.o.   MRN: [id number]  Synopsis: GOLD GRADE A COPD 12/2014 PFT> FEV1 88% pred, normal TLC and DLCO  HPI Chief Complaint  Patient presents with  . Follow-up    review pft.  pt has no breathing complaints at this time . pt using 2lpm qhs.    [name] is her to follow up on her PFT.  She was hospitalzied after two hernia operations and on one of these episodes she had some pulmonary edema.  She was referred to [place] for evaluation of COPD as she was discharged on oxygen and at some point a diagnosis of COPD had been made based on a smoking history alone.    She smoked 1 pdd for about 30-40 years.  She smoked from age 59 to age 54  She had been discharged on oxygen after her hospitalizations.  Lately she has been feeling well and she has not been using it during the day for a month.  She has not been using the nebulizer.  In general she feels much better and and she has been exercising in the last few weeks.  She has been walking and doing physical therapy.    She produces clear to yellow mucus worse with eating. She wonders if she has been told to take water with eating due to known mild aspiration.  She does not have heart burn, indigestion or upset stomach.   Past Medical History  Diagnosis Date  . Hx of colonic polyps     last colonoscopy 2005, repeat was due 2010 Dr. Sharlett Iles  . Atrial fibrillation     Dr Percival Spanish  . Fracture 1995, 2009    RUE; LUE Dr. Durward Fortes  . 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 Cystitis   . Complication of anesthesia     difficulty with arousal post op  . Hyperlipidemia   . Hyperlipidemia   . Osteoporosis   . Cerebrovascular accident 01/09  . Hypothyroidism       Review of Systems     Objective:   Physical Exam Filed Vitals:   01/02/15 1019  BP: 124/62  Pulse: 61  Height: 5' 7.5" (1.715 m)  Weight: 124 lb (56.246 kg)  SpO2: 93%  RA  Gen: well  appearing, no acute distress HEENT: NCAT, EOMi, OP clear,  PULM: CTA B CV: RRR, systolic murmur, JVD noted AB: BS+, soft, nontender,  Ext: warm, no edema, no clubbing, no cyanosis Derm: no rash or skin breakdown Neuro: A&Ox4, MAEW  11/2014 CXR> atectasis and effusions bases (improved), emphysema     Assessment & Plan:   COPD, Gold grade A I have explained to [place] and her family today but the results of the PFT confirm a diagnosis of COPD but it is mild. Further, she has very minimal symptoms at this time. She has recovered from her recent episode of pulmonary edema. Today she has no evidence of an ongoing COPD exacerbation.  Her oximetry today in clinic was normal.  Plan: -Continue as needed Combivent -We will repeat an overnight oximetry test to see if she still needs to use oxygen at night -Follow-up 6 months    Updated Medication List Outpatient Encounter Prescriptions as of 01/02/2015  Medication Sig  . acetaminophen (TYLENOL) 325 MG tablet You can take 2 tablets every 4 hours as needed.  You cannot take more than 4000 mg of  tylenol per day.  This is also in your sleep aide, and it is in your prescribed pain medicine. So you need to figure out.  Do not take more than 12 tablets of tylenol (acetaminophen) per day.  Marland Kitchen apixaban (ELIQUIS) 2.5 MG TABS tablet Take 1 tablet (2.5 mg total) by mouth 2 (two) times daily.  . B Complex Vitamins (VITAMIN B COMPLEX) TABS Take 1 tablet by mouth daily.   . bimatoprost (LUMIGAN) 0.03 % ophthalmic solution Place 1 drop into both eyes at bedtime.   . calcium carbonate (TUMS - DOSED IN MG ELEMENTAL CALCIUM) 500 MG chewable tablet Chew 2 tablets by mouth daily as needed for indigestion or heartburn (indigestion).  . Calcium Carbonate-Vitamin D (CALCIUM-VITAMIN D) 500-200 MG-UNIT per tablet Take 1 tablet by mouth daily.   . Cholecalciferol (VITAMIN D) 2000 UNITS tablet Take 2,000 Units by mouth daily.  . Cranberry (CRANBERRY CONCENTRATE) 500 MG  CAPS Take 2 capsules by mouth daily.    Marland Kitchen DIGOX 125 MCG tablet TAKE 1 TABLET ONCE DAILY.  Marland Kitchen diltiazem (CARDIZEM CD) 120 MG 24 hr capsule Take 120 mg by mouth daily.  Marland Kitchen ENSURE PLUS (ENSURE PLUS) LIQD Take 1 Can by mouth daily.  Marland Kitchen estradiol (ESTRACE) 0.1 MG/GM vaginal cream Place 2 g vaginally. Twice weekly  . levothyroxine (SYNTHROID, LEVOTHROID) 50 MCG tablet TAKE 1 TABLET ONCE DAILY.  Marland Kitchen LORazepam (ATIVAN) 1 MG tablet TAKE 1 TABLET EVERY 8-12 HOURS AS NEEDED. CAN BE HABIT FORMING & MAY AFFECT MENTAL ALERTNESS.  . Magnesium Oxide 500 MG TABS Take 1 tablet by mouth at bedtime.   . Probiotic Product (MISC INTESTINAL FLORA REGULAT) CAPS Take 1 capsule by mouth daily.    . sertraline (ZOLOFT) 50 MG tablet Take 1 tablet (50 mg total) by mouth daily.  . timolol (TIMOPTIC) 0.5 % ophthalmic solution Place 1 drop into both eyes 2 (two) times daily.   Marland Kitchen trimethoprim (TRIMPEX) 100 MG tablet Take 100 mg by mouth every other day.  Marland Kitchen VYTORIN 10-40 MG per tablet TAKE 1 TABLET ONCE DAILY.  . [DISCONTINUED] Ipratropium-Albuterol (COMBIVENT) 20-100 MCG/ACT AERS respimat Inhale 1 puff into the lungs every 6 (six) hours. (Patient not taking: Reported on 11/25/2014)  . [DISCONTINUED] traMADol (ULTRAM) 50 MG tablet Take 1-2 tablets (50-100 mg total) by mouth every 6 (six) hours as needed for moderate pain or severe pain. (Patient not taking: Reported on 01/02/2015)

## 2015-01-02 NOTE — Assessment & Plan Note (Signed)
I have explained to Kristine Simmons and her family today but the results of the PFT confirm a diagnosis of COPD but it is mild. Further, she has very minimal symptoms at this time. She has recovered from her recent episode of pulmonary edema. Today she has no evidence of an ongoing COPD exacerbation.  Her oximetry today in clinic was normal.  Plan: -Continue as needed Combivent -We will repeat an overnight oximetry test to see if she still needs to use oxygen at night -Follow-up 6 months

## 2015-01-02 NOTE — Progress Notes (Signed)
PFT done today. 

## 2015-01-10 ENCOUNTER — Telehealth: Payer: Self-pay

## 2015-01-10 NOTE — Telephone Encounter (Signed)
lmtcb X1 for pt  

## 2015-01-10 NOTE — Telephone Encounter (Signed)
-----   Message from Juanito Doom, MD sent at 01/09/2015 11:34 PM EST ----- A, Please let her know that she absolutely still needs to use oxygen at night. Thanks B

## 2015-01-10 NOTE — Telephone Encounter (Signed)
I spoke with patient about results and she verbalized understanding. Pt is wanting to know how low asked she desaturated and for how long. She does not feel comfortable continuing the O2 until she knows this. Please advise thanks

## 2015-01-10 NOTE — Telephone Encounter (Signed)
Pt returned call- [phone]

## 2015-01-12 ENCOUNTER — Other Ambulatory Visit: Payer: Self-pay | Admitting: Internal Medicine

## 2015-01-12 ENCOUNTER — Telehealth: Payer: Self-pay | Admitting: Internal Medicine

## 2015-01-12 DIAGNOSIS — R269 Unspecified abnormalities of gait and mobility: Secondary | ICD-10-CM

## 2015-01-12 NOTE — Telephone Encounter (Signed)
PT order placed.

## 2015-01-12 NOTE — Telephone Encounter (Signed)
Spoke with pt, relayed to pt that she had desaturated for over 6 hours and that's why she needs to wear her 02.  Pt understands.  Nothing further needed at this time.

## 2015-01-12 NOTE — Telephone Encounter (Signed)
I don't remember what the absolute lowest number was, but tell her it should be scanned into her chart and we can check that when she comes back.  However she spent 375 minutes below 88% which is a long time to have a very low oxygen level.  I don't recall having any patient recently with that much time below 88%.

## 2015-01-12 NOTE — Telephone Encounter (Signed)
Kristine Simmons called from [facility] stated that they are discharging her today because she is not homebound. Kristine Simmons also stated she notice that Kristine Simmons has balance disorder and recommend Dr. Linna Darner to check up on this and maybe [facility]. Please call Kristine Simmons of what Dr. Linna Darner advise.

## 2015-01-16 ENCOUNTER — Other Ambulatory Visit: Payer: Self-pay | Admitting: Cardiology

## 2015-01-31 ENCOUNTER — Ambulatory Visit: Payer: Medicare Other | Attending: Internal Medicine

## 2015-01-31 DIAGNOSIS — R269 Unspecified abnormalities of gait and mobility: Secondary | ICD-10-CM | POA: Diagnosis not present

## 2015-01-31 NOTE — Therapy (Signed)
[facility] [address] [place], [place], [address] Phone: [phone]   Fax:  [phone]  Physical Therapy Evaluation  Patient Details  Name: Kristine Simmons MRN: [id number] Date of Birth: [birth year] Referring Provider:  Hendricks Limes, MD  Encounter Date: 01/31/2015      PT End of Session - 01/31/15 1723    Visit Number 1   Number of Visits 17   Date for PT Re-Evaluation 04/01/15   Authorization Type G-code every 10th visit.   PT Start Time 1103   PT Stop Time 1147   PT Time Calculation (min) 44 min   Equipment Utilized During Treatment Gait belt   Activity Tolerance Patient tolerated treatment well   Behavior During Therapy WFL for tasks assessed/performed      Past Medical History  Diagnosis Date  . Hx of colonic polyps     last colonoscopy 2005, repeat was due 2010 Dr. Sharlett Iles  . Atrial fibrillation     Dr Percival Spanish  . Fracture 1995, 2009    RUE; LUE Dr. Durward Fortes  . 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 Cystitis   . Complication of anesthesia     difficulty with arousal post op  . Hyperlipidemia   . Hyperlipidemia   . Osteoporosis   . Cerebrovascular accident 01/09  . Hypothyroidism     Past Surgical History  Procedure Laterality Date  . Tonsillectomy and adenoidectomy    . Vocal cord polyps    . Thyroid needle biopsy  2002  . Appendectomy    . Cataract extraction      Bilat  . Colonoscopy w/ polypectomy  2005    Diverticulosis;Dr. Sharlett Iles  . Total hip arthroplasty  2008    CHF & RAF post op   . Total shoulder replacement  2010    Dr Durward Fortes - left shoulder  . Mohs surgery  2013    nasal Basal Cell; Dr Sarajane Jews  . Excision metacarpal mass Right 08/17/2013    Procedure: RIGHT LONG EXCISION MASS AND DIP JOINT DEBRIDEMENT;  Surgeon: Tennis Must, MD;  Location: [facility];  Service: Orthopedics;  Laterality: Right;  . Femoral hernia repair Right  08/17/2014    Procedure: OPEN REPAIR RIGHT FEMORAL HERNIA  WITH INSERTION OF MESH;  Surgeon: Adin Hector, MD;  Location: [facility];  Service: General;  Laterality: Right;  . Insertion of mesh  08/17/2014    Procedure: INSERTION OF MESH;  Surgeon: Adin Hector, MD;  Location: [facility];  Service: General;;  . Hernia repair    . Inguinal hernia repair Right 11/15/2014    Procedure: LAPAROSCOPIC RIGHT INGUINAL HERNIA WITH MESH;  Surgeon: Alphonsa Overall, MD;  Location: [facility];  Service: General;  Laterality: Right;    There were no vitals taken for this visit.  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 No/denies          [facility] PT Assessment - 01/31/15 1118  Assessment   Medical Diagnosis Abnormality of gait   Onset Date 07/23/14   Precautions   Precautions Fall   Restrictions   Weight Bearing Restrictions No   Balance Screen   Has the patient fallen in the past 6 months No   Has the patient had a decrease in activity level because of a fear of falling?  Yes   Is the patient reluctant to leave their home because of a fear of falling?  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   Strength   Overall Strength Deficits   Overall Strength Comments B LE grossly 4/5.   Transfers   Transfers Sit to Stand;Stand to Sit   Sit to Stand 5: Supervision;With upper extremity assist;From chair/3-in-1   Stand to Sit 5: Supervision;Without upper extremity assist;To chair/3-in-1   Ambulation/Gait   Ambulation/Gait Yes   Ambulation/Gait Assistance 5: Supervision   Ambulation/Gait Assistance Details Pt ambulated over even terrain, without LOB.    Ambulation Distance (Feet) 100 Feet   Assistive device None   Gait Pattern Step-through pattern;Decreased stride length;Narrow base of support;Decreased trunk rotation  occasional narrow BOS, guarded due to fear of falling   Ambulation Surface Level;Indoor   Gait velocity 2.66ft/sec.  no AD   Standardized Balance Assessment   Standardized Balance Assessment Timed Up and Go Test;Berg Balance Test   Berg Balance Test   Sit to Stand Able to stand  independently using hands   Standing Unsupported Able to stand safely 2 minutes   Sitting with Back Unsupported but Feet Supported on Floor or Stool Able to sit safely and securely 2 minutes   Stand to Sit Sits safely with minimal use of hands   Transfers Able to transfer safely, minor use of hands   Standing Unsupported with Eyes Closed Able  to stand 10 seconds safely   Standing Ubsupported with Feet Together Able to place feet together independently and stand for 1 minute with supervision   From Standing, Reach Forward with Outstretched Arm Can reach confidently >25 cm (10")   From Standing Position, Pick up Object from Floor Able to pick up shoe, needs supervision   From Standing Position, Turn to Look Behind Over each Shoulder Looks behind from both sides and weight shifts well   Turn 360 Degrees Able to turn 360 degrees safely but slowly   Standing Unsupported, Alternately Place Feet on Step/Stool Needs assistance to keep from falling or unable to  try  Needs assist   Standing Unsupported, One Foot in Front Able to plae foot ahead of the other independently and hold 30 seconds   Standing on One Leg Tries to lift leg/unable to hold 3 seconds but remains standing independently   Total Score 43   Timed Up and Go Test   TUG Normal TUG   Normal TUG (seconds) 9.98  no AD                            PT Short Term Goals - 2015-02-02 1726    PT SHORT TERM GOAL #1   Title Pt will be independent in HEP to improve functional mobility. Target date: 02/28/15.   Status New   PT SHORT TERM GOAL #2   Title Pt will verbalize understanding of fall prevention strategies to decrease falls risk. Target date: 02/28/15.   Status New           PT Long Term Goals - 02-Feb-2015 1727    PT LONG TERM GOAL #1   Title Pt will verbalize plans to start Silver Sneakers program again upon discharge to maintain gains made in PT. Target date: 03/28/15.   Status New   PT LONG TERM GOAL #2   Title Pt will improve BERG balance score to 51/56 to decrease falls risk. Target date: 03/28/15.   Status New   PT LONG TERM GOAL #3   Title Pt will improve gait speed to >/=3.24ft/sec. to improve safety of ambulation in the community. Target date: 03/28/15.   Status New   PT LONG TERM GOAL #4   Title Pt will ambulate 600' over even/uneven terrain,  independently, to improve functional mobility and to work in garden at home. Target date: 03/28/15.   Status New   PT LONG TERM GOAL #5   Title Pt will improve ABC score by 10% to improve quality of life. Target date: 03/28/15.   Status New               Plan - 02-02-15 1113    Clinical Impression Statement Pt is a pleasant 81 y/o female presenting to OPPT neuro with abnormality of gait and impaired balance. Pt's husband, Rush Landmark, present during session. Pt reported she had surgery for hernia 07/2014 and again to repair initial hernia 10/2014, pt received HHPT after surgery. No falls in last six months, but pt reported she is fearful of falling.    Pt will benefit from skilled therapeutic intervention in order to improve on the following deficits Abnormal gait;Decreased endurance;Decreased balance;Decreased mobility;Decreased strength;Decreased knowledge of use of DME   Rehab Potential Good   PT Frequency 2x / week   PT Duration 8 weeks   PT Treatment/Interventions ADLs/Self Care Home Management;Gait training;Neuromuscular re-education;Stair training;Biofeedback;Functional mobility training;Patient/family education;Therapeutic activities;DME Instruction;Balance training;Therapeutic exercise;Manual techniques   PT Next Visit Plan Initiate balance and strength HEP.   Consulted and Agree with Plan of Care Patient          G-Codes - 02/02/15 1731    Functional Assessment Tool Used BERG: 43/56; gait speed: 2.69ft/sec.; TUG: 9.98 sec.   Functional Limitation Mobility: Walking and moving around   Mobility: Walking and Moving Around Current Status [phone]) At least 40 percent but less than 60 percent impaired, limited or restricted   Mobility: Walking and Moving Around Goal Status ([id number]) At least 1 percent but less than 20 percent impaired, limited or restricted       Problem List  Patient Active Problem List   Diagnosis Date Noted  . Recurrent inguinal hernia with obstruction 11/11/2014  .  Depression 09/22/2014  . Hypoxemia 09/22/2014  . Aortic stenosis, mild 08/15/2014  . Chronic anticoagulation 08/15/2014  . Recurrent femoral hernia with obstruction without gangrene 08/15/2014  . History of CVA (cerebrovascular accident) 08/15/2014  . Squamous cell carcinoma of neck 03/09/2014  . Cough 09/16/2012  . Diverticulosis 09/11/2011  . Glaucoma 09/11/2011  . CAROTID STENOSIS 08/08/2009  . HEARING LOSS 11/22/2008  . COPD, Gold grade A 04/08/2008  . HERNIA 04/08/2008  . ARTHRITIS 04/08/2008  . VITAMIN D DEFICIENCY 02/01/2008  . Atrial fibrillation 02/01/2008  . HYPERTENSION, ESSENTIAL NOS 12/01/2007  . OSTEOPOROSIS 12/01/2007  . Hypothyroidism 08/06/2007  . DISEQUILIBRIUM 07/24/2007  . G E R D 06/02/2007  . THYROID NODULE, LEFT 04/10/2007  . HYPERLIPIDEMIA 04/10/2007  . COLONIC POLYPS, HX OF 04/10/2007    Trudie Cervantes L 01/31/2015, 5:33 PM  [facility] [address] [place], [place], [address] Phone: [phone]   Fax:  [phone]   Geoffry Paradise, PT,DPT 01/31/2015 5:34 PM Phone: [phone] Fax: [phone]

## 2015-02-07 ENCOUNTER — Other Ambulatory Visit: Payer: Self-pay | Admitting: Internal Medicine

## 2015-02-07 ENCOUNTER — Ambulatory Visit: Payer: Medicare Other

## 2015-02-07 DIAGNOSIS — R269 Unspecified abnormalities of gait and mobility: Secondary | ICD-10-CM

## 2015-02-07 DIAGNOSIS — R059 Cough, unspecified: Secondary | ICD-10-CM

## 2015-02-07 DIAGNOSIS — R05 Cough: Secondary | ICD-10-CM

## 2015-02-07 NOTE — Therapy (Signed)
[facility] [address] [place], [place], [address] Phone: [phone]   Fax:  [phone]  Physical Therapy Treatment  Patient Details  Name: Kristine Simmons MRN: [id number] Date of Birth: [birth year] Referring Provider:  Hendricks Limes, MD  Encounter Date: 02/07/2015      PT End of Session - 02/07/15 1155    Visit Number 2   Number of Visits 17   Date for PT Re-Evaluation 04/01/15   Authorization Type G-code every 10th visit.   PT Start Time 1059   PT Stop Time 1139   PT Time Calculation (min) 40 min   Equipment Utilized During Treatment Gait belt   Activity Tolerance Patient tolerated treatment well   Behavior During Therapy WFL for tasks assessed/performed      Past Medical History  Diagnosis Date  . Hx of colonic polyps     last colonoscopy 2005, repeat was due 2010 Dr. Sharlett Iles  . Atrial fibrillation     Dr Percival Spanish  . Fracture 1995, 2009    RUE; LUE Dr. Durward Fortes  . 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 Cystitis   . Complication of anesthesia     difficulty with arousal post op  . Hyperlipidemia   . Hyperlipidemia   . Osteoporosis   . Cerebrovascular accident 01/09  . Hypothyroidism     Past Surgical History  Procedure Laterality Date  . Tonsillectomy and adenoidectomy    . Vocal cord polyps    . Thyroid needle biopsy  2002  . Appendectomy    . Cataract extraction      Bilat  . Colonoscopy w/ polypectomy  2005    Diverticulosis;Dr. Sharlett Iles  . Total hip arthroplasty  2008    CHF & RAF post op   . Total shoulder replacement  2010    Dr Durward Fortes - left shoulder  . Mohs surgery  2013    nasal Basal Cell; Dr Sarajane Jews  . Excision metacarpal mass Right 08/17/2013    Procedure: RIGHT LONG EXCISION MASS AND DIP JOINT DEBRIDEMENT;  Surgeon: Tennis Must, MD;  Location: [facility];  Service: Orthopedics;  Laterality: Right;  . Femoral hernia repair Right  08/17/2014    Procedure: OPEN REPAIR RIGHT FEMORAL HERNIA  WITH INSERTION OF MESH;  Surgeon: Adin Hector, MD;  Location[facility];  Service: General;  Laterality: Right;  . Insertion of mesh  08/17/2014    Procedure: INSERTION OF MESH;  Surgeon: Adin Hector, MD;  Location: [facility];  Service: General;;  . Hernia repair    . Inguinal hernia repair Right 11/15/2014    Procedure: LAPAROSCOPIC RIGHT INGUINAL HERNIA WITH MESH;  Surgeon: Alphonsa Overall, MD;  Location: [facility];  Service: General;  Laterality: Right;    There were no vitals taken for this visit.  Visit Diagnosis:  Abnormality of gait      Subjective Assessment - 02/07/15 1102    Symptoms Pt denied fals or changes since last visit. However, pt reported she did "stumble" backwards this morning, while rolling oxygen cord but she did not fall.   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 Yes  Pain Score 3    Pain Location Hip   Pain Orientation Left   Pain Descriptors / Indicators Aching   Pain Type Acute pain   Pain Onset Today   Aggravating Factors  pt had a near fall and bumped hip into rocking chair   Pain Relieving Factors nothing                    [facility] Adult PT Treatment/Exercise - 02/07/15 1104    Balance   Balance Assessed Yes   Static Standing Balance   Static Standing - Balance Support No upper extremity supported;Right upper extremity supported  One UE support during tandem and SLS.   Static Standing - Level of Assistance 5: Stand by assistance;Other (comment)  min guard   Static Standing - Comment/# of Minutes Performed in corner with chair in front of pt for safety; B LEs; 1-4 sets with 5-30 second holds: feet apart/together with eyes open/closed,  feet apart/together with head turns, tandem stance, single leg stance. VC's for technique. Pt required one seated rest break due to coughing fit.   Dynamic Standing Balance   Dynamic Standing - Balance Support Right upper extremity supported;No upper extremity supported   Dynamic Standing - Level of Assistance 5: Stand by assistance   Dynamic Standing - Balance Activities Lateral lean/weight shifting;Eyes open   Dynamic Standing - Comments x20 reps of ant/post weight shifting with 2 second holds. 0-1 UE support and cues to decrease trunk flexion. Hip marches with one hand on counter x10/LE. VC's to improve weight shifting.                PT Education - 02/07/15 1155    Education provided Yes   Education Details Balance HEP   Person(s) Educated Patient   Methods Explanation;Demonstration;Tactile cues;Handout   Comprehension Verbalized understanding;Returned demonstration          PT Short Term Goals - 02/07/15 1159    PT SHORT TERM GOAL #1   Title Pt will be independent in HEP to improve functional mobility. Target date: 02/28/15.   Status On-going   PT SHORT TERM GOAL #2   Title Pt will verbalize understanding of fall prevention strategies to decrease falls risk. Target date: 02/28/15.   Status On-going           PT Long Term Goals - 02/07/15 1159    PT LONG TERM GOAL #1   Title Pt will verbalize plans to start Silver Sneakers program again upon discharge to maintain gains made in PT. Target date: 03/28/15.   Status On-going   PT LONG TERM GOAL #2   Title Pt will improve BERG balance score to 51/56 to decrease falls risk. Target date: 03/28/15.   Status On-going   PT LONG TERM GOAL #3   Title Pt will improve gait speed to >/=3.50ft/sec. to improve safety of ambulation in the community. Target date: 03/28/15.   Status On-going   PT LONG TERM GOAL #4   Title Pt will ambulate 600' over even/uneven terrain, independently, to improve functional mobility and to work in garden at  home. Target date: 03/28/15.   Status On-going   PT LONG TERM GOAL #5   Title Pt will improve ABC score by 10% to improve quality of life. Target date: 03/28/15.   Status On-going               Plan - 02/07/15 1155    Clinical Impression Statement Pt demonstrating progress towards goals, as she  tolerated balance HEP well. Pt experiences increased postural sway with eyes closed, but pt was able to maintain balance with use of UEs. Pt experienced a coughing spell during session and required a seated rest break with water. Pt reported she typically coughs while eating and coughs up a lot of phlegm , and she had 2 swallow tests last year but was not sure of the results. Pt might benefit from a speech eval to ensure she is not aspirating while swallowing/eating. Continue with POC.   Pt will benefit from skilled therapeutic intervention in order to improve on the following deficits Abnormal gait;Decreased endurance;Decreased balance;Decreased mobility;Decreased strength;Decreased knowledge of use of DME   Rehab Potential Good   PT Frequency 2x / week   PT Duration 8 weeks   PT Treatment/Interventions ADLs/Self Care Home Management;Gait training;Neuromuscular re-education;Stair training;Biofeedback;Functional mobility training;Patient/family education;Therapeutic activities;DME Instruction;Balance training;Therapeutic exercise;Manual techniques   PT Next Visit Plan Initiate OTAGO HEP        Problem List Patient Active Problem List   Diagnosis Date Noted  . Recurrent inguinal hernia with obstruction 11/11/2014  . Depression 09/22/2014  . Hypoxemia 09/22/2014  . Aortic stenosis, mild 08/15/2014  . Chronic anticoagulation 08/15/2014  . Recurrent femoral hernia with obstruction without gangrene 08/15/2014  . History of CVA (cerebrovascular accident) 08/15/2014  . Squamous cell carcinoma of neck 03/09/2014  . Cough 09/16/2012  . Diverticulosis 09/11/2011  . Glaucoma 09/11/2011  . CAROTID  STENOSIS 08/08/2009  . HEARING LOSS 11/22/2008  . COPD, Gold grade A 04/08/2008  . HERNIA 04/08/2008  . ARTHRITIS 04/08/2008  . VITAMIN D DEFICIENCY 02/01/2008  . Atrial fibrillation 02/01/2008  . HYPERTENSION, ESSENTIAL NOS 12/01/2007  . OSTEOPOROSIS 12/01/2007  . Hypothyroidism 08/06/2007  . DISEQUILIBRIUM 07/24/2007  . G E R D 06/02/2007  . THYROID NODULE, LEFT 04/10/2007  . HYPERLIPIDEMIA 04/10/2007  . COLONIC POLYPS, HX OF 04/10/2007    Ori Trejos L 02/07/2015, 12:00 PM  [facility] [address] [place], [place], [address] Phone: [phone]   Fax:  [phone]     Geoffry Paradise, PT,DPT 02/07/2015 12:00 PM Phone: [phone] Fax: [phone]

## 2015-02-07 NOTE — Patient Instructions (Signed)
**  Perform static standing balance activities in corner with chair in front of you for safety:  Feet Together, Head Motion - Eyes Open   With eyes open, feet together, move head slowly: up and down and side and side for 30 seconds. Repeat __3__ times per session. Do __1__ sessions per day.  Copyright  VHI. All rights reserved.  Feet Together, Varied Arm Positions - Eyes Closed   Stand with feet together and arms your sides. Close eyes and visualize upright position. Hold __20-30__ seconds. Repeat _3___ times per session. Do __1__ sessions per day.  Copyright  VHI. All rights reserved.  Feet Heel-Toe "Tandem", Varied Arm Positions - Eyes Open   With eyes open, right foot directly in front of the other, arms at your side, look straight ahead at a stationary object. Hold __5-30__ seconds. Repeat with other foot in front. Repeat __3__ times per session with each leg in front. Do __1__ sessions per day.  Copyright  VHI. All rights reserved.  Single Leg - Eyes Open   Holding support, lift right leg while maintaining balance over other leg. Progress to removing hands from support surface for longer periods of time. Hold__10-30__ seconds. Repeat with other leg. Repeat _3___ times per session with each leg. Do __1__ sessions per day.  Copyright  VHI. All rights reserved.  Weight Shift: Anterior / Posterior (Limits of Stability)   Hold onto chair, as needed. Slowly shift weight backward until toes begin to rise off floor. Return to starting position. Shift weight slowly forward until heels begin to rise off floor. Hold each position __2__ seconds. Repeat __20__ times per session. Do __1__ sessions per day.   Copyright  VHI. All rights reserved.  Marching in Place: Varied Surfaces   March in place while holding onto counter with one hand, slowly lifting knees toward ceiling. Repeat __10__ times per leg per session. Do __1__ sessions per day.  Copyright  VHI. All rights reserved.

## 2015-02-08 ENCOUNTER — Other Ambulatory Visit: Payer: Self-pay | Admitting: Internal Medicine

## 2015-02-08 ENCOUNTER — Ambulatory Visit: Payer: Medicare Other

## 2015-02-08 DIAGNOSIS — R059 Cough, unspecified: Secondary | ICD-10-CM

## 2015-02-08 DIAGNOSIS — R269 Unspecified abnormalities of gait and mobility: Secondary | ICD-10-CM

## 2015-02-08 DIAGNOSIS — Z8673 Personal history of transient ischemic attack (TIA), and cerebral infarction without residual deficits: Secondary | ICD-10-CM

## 2015-02-08 DIAGNOSIS — R1310 Dysphagia, unspecified: Secondary | ICD-10-CM

## 2015-02-08 DIAGNOSIS — R05 Cough: Secondary | ICD-10-CM

## 2015-02-08 NOTE — Therapy (Signed)
[facility] [address] [place], [place], [address] Phone: [phone]   Fax:  [phone]  Patient Details  Name: Kristine Simmons MRN: [id number] Date of Birth: 12/30/1926 Referring Provider:  Hendricks Limes, MD  Encounter Date: 02/08/2015   S: "Since the end of August I've been in and out of Dr's offices." Pt admittedly unaware of recommendations and swallow compensations from last modified barium swallow exam in June 2015, and thus has not utilized compensations. Pt denied overt s/s aspiration PNA.   O: SLP reviewed the suggested recommendations and swallow compensations with pt/husband from MBSS done in June 2015. SLP sent pt home with handout of swallow compensations recommended from MBSS. Pt states she coughs in the middle of a meal, and coughs due to "a conglomeration of phlegm" occasionally during the day. No overt s/s aspiration PNA were observed today.   A: This SLP's opinion is if aspiration is suspected, PCP should write script for modified barium swallow exam (MBSS), in order to rule out a new oropharyngeal dysphagia vs. persistent esophageal dysphagia, and to ID possible swallowing musculature requiring strengthening via a home exercise program in a course of therapy at this facility.  P: Swallowing therapy at this facility may be recommended following any modified barium swallow exam that may be completed in the near future.  [facility] 02/08/2015, 11:44 AM  [facility] [address] , [place], [address] Phone: [phone]   Fax:  [phone]

## 2015-02-08 NOTE — Patient Instructions (Signed)
  Swallowing compensations (from test in June 2015):   Small bites and sips  Alternate between bite of food and a sip of liquid during your meals  Remain upright for 60 minutes after eating

## 2015-02-08 NOTE — Therapy (Signed)
[facility] [address] [place], [place], [address] Phone: [phone]   Fax:  [phone]  Physical Therapy Treatment  Patient Details  Name: Kristine Simmons MRN: [id number] Date of Birth: Jun 10, 1927 Referring Provider:  Hendricks Limes, MD  Encounter Date: 02/08/2015      PT End of Session - 02/08/15 1235    Visit Number 3   Number of Visits 17   Date for PT Re-Evaluation 04/01/15   Authorization Type G-code every 10th visit.   PT Start Time 1148   PT Stop Time 1231   PT Time Calculation (min) 43 min   Activity Tolerance Patient tolerated treatment well   Behavior During Therapy WFL for tasks assessed/performed      Past Medical History  Diagnosis Date  . Hx of colonic polyps     last colonoscopy 2005, repeat was due 2010 Dr. Sharlett Iles  . Atrial fibrillation     Dr Percival Spanish  . Fracture 1995, 2009    RUE; LUE Dr. Durward Fortes  . 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 Cystitis   . Complication of anesthesia     difficulty with arousal post op  . Hyperlipidemia   . Hyperlipidemia   . Osteoporosis   . Cerebrovascular accident 01/09  . Hypothyroidism     Past Surgical History  Procedure Laterality Date  . Tonsillectomy and adenoidectomy    . Vocal cord polyps    . Thyroid needle biopsy  2002  . Appendectomy    . Cataract extraction      Bilat  . Colonoscopy w/ polypectomy  2005    Diverticulosis;Dr. Sharlett Iles  . Total hip arthroplasty  2008    CHF & RAF post op   . Total shoulder replacement  2010    Dr Durward Fortes - left shoulder  . Mohs surgery  2013    nasal Basal Cell; Dr Sarajane Jews  . Excision metacarpal mass Right 08/17/2013    Procedure: RIGHT LONG EXCISION MASS AND DIP JOINT DEBRIDEMENT;  Surgeon: Tennis Must, MD;  Location: [facility];  Service: Orthopedics;  Laterality: Right;  . Femoral hernia repair Right 08/17/2014    Procedure: OPEN REPAIR RIGHT  FEMORAL HERNIA  WITH INSERTION OF MESH;  Surgeon: Adin Hector, MD;  Location: [facility];  Service: General;  Laterality: Right;  . Insertion of mesh  08/17/2014    Procedure: INSERTION OF MESH;  Surgeon: Adin Hector, MD;  Location: [facility];  Service: General;;  . Hernia repair    . Inguinal hernia repair Right 11/15/2014    Procedure: LAPAROSCOPIC RIGHT INGUINAL HERNIA WITH MESH;  Surgeon: Alphonsa Overall, MD;  Location: [facility];  Service: General;  Laterality: Right;    There were no vitals taken for this visit.  Visit Diagnosis:  Abnormality of gait      Subjective Assessment - 02/08/15 1150    Symptoms Pt denied falls or changes since last visit. Pt had an eval with the speech therapist and he sent a note to Dr. Linna Darner regarding another swallowing test to determine if pt is aspirating.   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 No/denies  Balance Exercises - 02/08/15 1152    OTAGO PROGRAM   Head Movements Standing;5 reps   Neck Movements Standing;5 reps   Back Extension Standing;5 reps   Trunk Movements Standing;5 reps   Ankle Movements Sitting;10 reps   Knee Extensor 10 reps;Weight (comment)  x10 without weights and x10 with 4 lb. ankle weight   Knee Flexor 10 reps;Weight (comment)  x10 without weights and x10 with 2lb. weight   Hip ABductor 10 reps;Weight (comment)  x10 without weights and x10 with 2lb. weights.   Overall OTAGO Comments VC's and demonstration for technique.           PT Education - 02/08/15 1234    Education provided Yes   Education Details PT explained OTAGO exercise program and why pt would benefit from [place], in order to improve balance and decrease falls risk.    Person(s) Educated Patient   Methods Explanation;Demonstration;Tactile cues;Verbal cues;Handout   Comprehension Verbalized understanding;Returned demonstration;Need further instruction          PT Short Term Goals - 02/07/15 1159    PT SHORT TERM GOAL #1   Title Pt will be independent in HEP to improve functional mobility. Target date: 02/28/15.   Status On-going   PT SHORT TERM GOAL #2   Title Pt will verbalize understanding of fall prevention strategies to decrease falls risk. Target date: 02/28/15.   Status On-going           PT Long Term Goals - 02/07/15 1159    PT LONG TERM GOAL #1   Title Pt will verbalize plans to start Silver Sneakers program again upon discharge to maintain gains made in PT. Target date: 03/28/15.   Status On-going   PT LONG TERM GOAL #2   Title Pt will improve BERG balance score to 51/56 to decrease falls risk. Target date: 03/28/15.   Status On-going   PT LONG TERM GOAL #3   Title Pt will improve gait speed to >/=3.41ft/sec. to improve safety of ambulation in the community. Target date: 03/28/15.   Status On-going   PT LONG TERM GOAL #4   Title Pt will ambulate 600' over even/uneven terrain, independently, to improve functional mobility and to work in garden at home. Target date: 03/28/15.   Status On-going   PT LONG TERM GOAL #5   Title Pt will improve ABC score by 10% to improve quality of life. Target date: 03/28/15.   Status On-going               Plan - 02/08/15 1247    Clinical Impression Statement Pt demonstrated progress, as she was able to tolerate OTAGO strengthening exercises with resistance. However, pt continues to require UE support at counter during activities due to impaired balance. Pt would continue to benefit from skliled PT to improve safety during functional mobility.   Pt will benefit from skilled therapeutic intervention in order to improve on the following deficits Abnormal gait;Decreased endurance;Decreased balance;Decreased  mobility;Decreased strength;Decreased knowledge of use of DME   Rehab Potential Good   PT Frequency 2x / week   PT Duration 8 weeks   PT Treatment/Interventions ADLs/Self Care Home Management;Gait training;Neuromuscular re-education;Stair training;Biofeedback;Functional mobility training;Patient/family education;Therapeutic activities;DME Instruction;Balance training;Therapeutic exercise;Manual techniques   PT Next Visit Plan Finish OTAGO and provide to pt.   Consulted and Agree with Plan of Care Patient        Problem List Patient Active Problem List   Diagnosis Date Noted  . Recurrent inguinal hernia with obstruction 11/11/2014  .  Depression 09/22/2014  . Hypoxemia 09/22/2014  . Aortic stenosis, mild 08/15/2014  . Chronic anticoagulation 08/15/2014  . Recurrent femoral hernia with obstruction without gangrene 08/15/2014  . History of CVA (cerebrovascular accident) 08/15/2014  . Squamous cell carcinoma of neck 03/09/2014  . Cough 09/16/2012  . Diverticulosis 09/11/2011  . Glaucoma 09/11/2011  . CAROTID STENOSIS 08/08/2009  . HEARING LOSS 11/22/2008  . COPD, Gold grade A 04/08/2008  . HERNIA 04/08/2008  . ARTHRITIS 04/08/2008  . VITAMIN D DEFICIENCY 02/01/2008  . Atrial fibrillation 02/01/2008  . HYPERTENSION, ESSENTIAL NOS 12/01/2007  . OSTEOPOROSIS 12/01/2007  . Hypothyroidism 08/06/2007  . DISEQUILIBRIUM 07/24/2007  . G E R D 06/02/2007  . THYROID NODULE, LEFT 04/10/2007  . HYPERLIPIDEMIA 04/10/2007  . COLONIC POLYPS, HX OF 04/10/2007    Mellody Masri L 02/08/2015, 12:50 PM  [facility] [address] [place], [place], [address] Phone: [phone]   Fax:  [phone]     Geoffry Paradise, PT,DPT 02/08/2015 12:54 PM Phone: [phone] Fax: [phone]

## 2015-02-09 ENCOUNTER — Ambulatory Visit: Payer: Medicare Other

## 2015-02-14 ENCOUNTER — Ambulatory Visit: Payer: Medicare Other

## 2015-02-14 DIAGNOSIS — R269 Unspecified abnormalities of gait and mobility: Secondary | ICD-10-CM

## 2015-02-14 NOTE — Therapy (Addendum)
[facility] [address] [place], [place], [address] Phone: [phone]   Fax:  [phone]  Physical Therapy Treatment  Patient Details  Name: Kristine Simmons MRN: [id number] Date of Birth: 12/01/1927 Referring Provider:  Hendricks Limes, MD  Encounter Date: 02/14/2015      PT End of Session - 02/14/15 1646    Visit Number 4   Number of Visits 17   Date for PT Re-Evaluation 04/01/15   Authorization Type G-code every 10th visit.   PT Start Time 1101   PT Stop Time 1143   PT Time Calculation (min) 42 min   Equipment Utilized During Treatment Gait belt   Activity Tolerance Patient tolerated treatment well   Behavior During Therapy WFL for tasks assessed/performed      Past Medical History  Diagnosis Date  . Hx of colonic polyps     last colonoscopy 2005, repeat was due 2010 Dr. Sharlett Iles  . Atrial fibrillation     Dr Percival Spanish  . Fracture 1995, 2009    RUE; LUE Dr. Durward Fortes  . 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 Cystitis   . Complication of anesthesia     difficulty with arousal post op  . Hyperlipidemia   . Hyperlipidemia   . Osteoporosis   . Cerebrovascular accident 01/09  . Hypothyroidism     Past Surgical History  Procedure Laterality Date  . Tonsillectomy and adenoidectomy    . Vocal cord polyps    . Thyroid needle biopsy  2002  . Appendectomy    . Cataract extraction      Bilat  . Colonoscopy w/ polypectomy  2005    Diverticulosis;Dr. Sharlett Iles  . Total hip arthroplasty  2008    CHF & RAF post op   . Total shoulder replacement  2010    Dr Durward Fortes - left shoulder  . Mohs surgery  2013    nasal Basal Cell; Dr Sarajane Jews  . Excision metacarpal mass Right 08/17/2013    Procedure: RIGHT LONG EXCISION MASS AND DIP JOINT DEBRIDEMENT;  Surgeon: Tennis Must, MD;  Location: [facility];  Service: Orthopedics;  Laterality: Right;  . Femoral hernia repair Right  08/17/2014    Procedure: OPEN REPAIR RIGHT FEMORAL HERNIA  WITH INSERTION OF MESH;  Surgeon: Adin Hector, MD;  Location[facility];  Service: General;  Laterality: Right;  . Insertion of mesh  08/17/2014    Procedure: INSERTION OF MESH;  Surgeon: Adin Hector, MD;  Location[facility];  Service: General;;  . Hernia repair    . Inguinal hernia repair Right 11/15/2014    Procedure: LAPAROSCOPIC RIGHT INGUINAL HERNIA WITH MESH;  Surgeon: Alphonsa Overall, MD;  Location[facility];  Service: General;  Laterality: Right;    There were no vitals taken for this visit.  Visit Diagnosis:  Abnormality of gait      Subjective Assessment - 02/14/15 1103    Symptoms Pt denied falls or changes since last visit.    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 No/denies  Balance Exercises - 02/14/15 1122    OTAGO PROGRAM   Ankle Plantorflexors 20 reps, support   Ankle Dorsiflexors 20 reps, support   Knee Bends 10 reps, support   Backwards Walking Support   Walking and Turning Around No assistive device   Sideways Walking Assistive device  one hand on counter   Tandem Stance 10 seconds, support   Tandem Walk Support   One Leg Stand 10 seconds, support   Heel Walking Support   Toe Walk Support   Heel Toe Walking Backward No support  with support   Sit to Stand 10 reps, no support   Stair Walking --  not performed   Overall OTAGO Comments VC's and demonstration for technique.     Several OTAGO activites performed multiple times, per pt request as she wanted to ensure she was performing correctly. Neuro re-ed: B scapular retraction x10 with 2 second holds, B shoulder posterior direction circles x10. All to  improve posture and reduce intermittent L shoulder pain. VC's and demonstration for technique. Provided as HEP for pt.      PT Education - 02/14/15 1646    Education provided Yes   Education Details OTAGO HEP   Person(s) Educated Patient   Methods Explanation;Demonstration;Verbal cues;Handout   Comprehension Verbalized understanding;Returned demonstration          PT Short Term Goals - 02/07/15 1159    PT SHORT TERM GOAL #1   Title Pt will be independent in HEP to improve functional mobility. Target date: 02/28/15.   Status On-going   PT SHORT TERM GOAL #2   Title Pt will verbalize understanding of fall prevention strategies to decrease falls risk. Target date: 02/28/15.   Status On-going           PT Long Term Goals - 02/07/15 1159    PT LONG TERM GOAL #1   Title Pt will verbalize plans to start Silver Sneakers program again upon discharge to maintain gains made in PT. Target date: 03/28/15.   Status On-going   PT LONG TERM GOAL #2   Title Pt will improve BERG balance score to 51/56 to decrease falls risk. Target date: 03/28/15.   Status On-going   PT LONG TERM GOAL #3   Title Pt will improve gait speed to >/=3.73ft/sec. to improve safety of ambulation in the community. Target date: 03/28/15.   Status On-going   PT LONG TERM GOAL #4   Title Pt will ambulate 600' over even/uneven terrain, independently, to improve functional mobility and to work in garden at home. Target date: 03/28/15.   Status On-going   PT LONG TERM GOAL #5   Title Pt will improve ABC score by 10% to improve quality of life. Target date: 03/28/15.   Status On-going               Plan - 02/14/15 1646    Clinical Impression Statement Pt demonstrated progress, as she tolerated remaining OTAGO balance and strengthening HEP well. She did require ues of UEs during most balance exercises due to impaired balance. Pt would continue to benefit from skilled PT to improve safety during functional mobility.   Pt will  benefit from skilled therapeutic intervention in order to improve on the following deficits Abnormal gait;Decreased endurance;Decreased balance;Decreased mobility;Decreased strength;Decreased knowledge of use of DME   Rehab Potential Good   PT Frequency 2x / week   PT Duration 8 weeks   PT Treatment/Interventions ADLs/Self Care Home Management;Gait training;Neuromuscular re-education;Stair training;Biofeedback;Functional mobility training;Patient/family education;Therapeutic activities;DME  Instruction;Balance training;Therapeutic exercise;Manual techniques   PT Next Visit Plan Progress dynamic gait and balance training.   Consulted and Agree with Plan of Care Patient        Problem List Patient Active Problem List   Diagnosis Date Noted  . Recurrent inguinal hernia with obstruction 11/11/2014  . Depression 09/22/2014  . Hypoxemia 09/22/2014  . Aortic stenosis, mild 08/15/2014  . Chronic anticoagulation 08/15/2014  . Recurrent femoral hernia with obstruction without gangrene 08/15/2014  . History of CVA (cerebrovascular accident) 08/15/2014  . Squamous cell carcinoma of neck 03/09/2014  . Cough 09/16/2012  . Diverticulosis 09/11/2011  . Glaucoma 09/11/2011  . CAROTID STENOSIS 08/08/2009  . HEARING LOSS 11/22/2008  . COPD, Gold grade A 04/08/2008  . HERNIA 04/08/2008  . ARTHRITIS 04/08/2008  . VITAMIN D DEFICIENCY 02/01/2008  . Atrial fibrillation 02/01/2008  . HYPERTENSION, ESSENTIAL NOS 12/01/2007  . OSTEOPOROSIS 12/01/2007  . Hypothyroidism 08/06/2007  . DISEQUILIBRIUM 07/24/2007  . G E R D 06/02/2007  . THYROID NODULE, LEFT 04/10/2007  . HYPERLIPIDEMIA 04/10/2007  . COLONIC POLYPS, HX OF 04/10/2007    Yazmina Pareja L 02/14/2015, 4:50 PM  [facility] [address] [place], [place], [address] Phone: [phone]   Fax:  [phone]     Geoffry Paradise, PT,DPT 02/14/2015 4:50 PM Phone:  [phone] Fax: [phone]

## 2015-02-14 NOTE — Patient Instructions (Signed)
Retraction: Scapula - Bilateral   Sitting up tall, squeeze shoulder blades together. Hold for 2 seconds. Repeat __10__ times per set. Do __1__ sets per session. Do __2__ sessions per day  Copyright  VHI. All rights reserved.

## 2015-02-17 ENCOUNTER — Other Ambulatory Visit: Payer: Self-pay | Admitting: Internal Medicine

## 2015-02-17 ENCOUNTER — Ambulatory Visit: Payer: Medicare Other

## 2015-02-17 DIAGNOSIS — R0902 Hypoxemia: Secondary | ICD-10-CM

## 2015-02-17 DIAGNOSIS — R269 Unspecified abnormalities of gait and mobility: Secondary | ICD-10-CM | POA: Diagnosis not present

## 2015-02-17 DIAGNOSIS — R0609 Other forms of dyspnea: Principal | ICD-10-CM

## 2015-02-17 NOTE — Therapy (Signed)
[facility] [address] [place], [place], [address] Phone: [phone]   Fax:  [phone]  Physical Therapy Treatment  Patient Details  Name: Kristine Simmons MRN: [id number] Date of Birth: Feb 19, 1927 Referring Provider:  Hendricks Limes, MD  Encounter Date: 02/17/2015      PT End of Session - 02/17/15 1609    Visit Number 5   Number of Visits 17   Date for PT Re-Evaluation 04/01/15   Authorization Type G-code every 10th visit.   PT Start Time 1102   PT Stop Time 1145   PT Time Calculation (min) 43 min   Equipment Utilized During Treatment Gait belt   Activity Tolerance Other (comment)  limited by decreased SaO2.   Behavior During Therapy [facility] for tasks assessed/performed      Past Medical History  Diagnosis Date  . Hx of colonic polyps     last colonoscopy 2005, repeat was due 2010 Dr. Sharlett Iles  . Atrial fibrillation     Dr Percival Spanish  . Fracture 1995, 2009    RUE; LUE Dr. Durward Fortes  . 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 Cystitis   . Complication of anesthesia     difficulty with arousal post op  . Hyperlipidemia   . Hyperlipidemia   . Osteoporosis   . Cerebrovascular accident 01/09  . Hypothyroidism     Past Surgical History  Procedure Laterality Date  . Tonsillectomy and adenoidectomy    . Vocal cord polyps    . Thyroid needle biopsy  2002  . Appendectomy    . Cataract extraction      Bilat  . Colonoscopy w/ polypectomy  2005    Diverticulosis;Dr. Sharlett Iles  . Total hip arthroplasty  2008    CHF & RAF post op   . Total shoulder replacement  2010    Dr Durward Fortes - left shoulder  . Mohs surgery  2013    nasal Basal Cell; Dr Sarajane Jews  . Excision metacarpal mass Right 08/17/2013    Procedure: RIGHT LONG EXCISION MASS AND DIP JOINT DEBRIDEMENT;  Surgeon: Tennis Must, MD;  Location: [facility];  Service: Orthopedics;  Laterality: Right;  . Femoral hernia  repair Right 08/17/2014    Procedure: OPEN REPAIR RIGHT FEMORAL HERNIA  WITH INSERTION OF MESH;  Surgeon: Adin Hector, MD;  [facility];  Service: General;  Laterality: Right;  . Insertion of mesh  08/17/2014    Procedure: INSERTION OF MESH;  Surgeon: Adin Hector, MD;  Location[facility];  Service: General;;  . Hernia repair    . Inguinal hernia repair Right 11/15/2014    Procedure: LAPAROSCOPIC RIGHT INGUINAL HERNIA WITH MESH;  Surgeon: Alphonsa Overall, MD;  [facility];  Service: General;  Laterality: Right;    There were no vitals taken for this visit.  Visit Diagnosis:  Abnormality of gait      Subjective Assessment - 02/17/15 1104    Symptoms Pt denied falls or changes since last visit. Pt reported she has been performing HEP twice a day, she is unable to perform squats due to knee pain. Pt reported she has an appt. with eye MD due to pt experiencing increase in blurriness. Pt reported she is more tired and fatigue, she's not sure why. Pt also reported nasal cannula does not stay in nostrils at night and that she is to use O2 at night only.   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 No/denies                    [facility] Adult PT Treatment/Exercise - 02/17/15 1109    Ambulation/Gait   Ambulation/Gait Yes   Ambulation/Gait Assistance 5: Supervision;4: Min guard   Ambulation/Gait Assistance Details Pt ambulated over even terrain for 6 minute walk test: 71' with out AD and no rest breaks. Pt reported SOB after test and required seated rest break. SaO2 on room air was 84% after test, pt required seated rest break and pursed lip breathing to increase SaO2 to >92%. 2 minute walk: 282', pt's SaO2 on room air after 2  minute test was 85% and increased to >92% with seated rest break and pursed lip breathing.  Pt ambulated 53' for approx. 30 seconds with no SOB and SaO2 on room air >92%. VC's during gait to improve stride length, upright posture, and B heel strike.   Ambulation Distance (Feet) --  837', 282', 40', 75',    Assistive device None   Gait Pattern Step-through pattern;Decreased stride length;Narrow base of support;Decreased trunk rotation   Ambulation Surface Level;Indoor                PT Education - 02/17/15 1606    Education provided Yes   Education Details PT educated pt on O2 decreasing while ambulating, and the importance of receiving proper O2 to tissues especially during activity. PT encouraged pt to check SaO2 through out the day and to sit/perform pursed lip breathing  if SaO2 decreases to <92%. PT also encouraged pt to inform MD regarding Brandon not fitting properly at night, as decrease O2 can cause fatigue and wooziness. Pt reported she would like to cease SpO2 and PT encouraged pt to use SpO2 as prescribed by MD. PT educated pt on performing squats in pain free ROM, and to continue shoulder exercises for improve posture.   Person(s) Educated Patient   Methods Explanation   Comprehension Verbalized understanding;Need further instruction          PT Short Term Goals - 02/07/15 1159    PT SHORT TERM GOAL #1   Title Pt will be independent in HEP to improve functional mobility. Target date: 02/28/15.   Status On-going   PT SHORT TERM GOAL #2   Title Pt will verbalize understanding of fall prevention strategies to decrease falls risk. Target date: 02/28/15.   Status On-going           PT Long Term Goals - 02/07/15 1159    PT LONG TERM GOAL #1   Title Pt will verbalize plans to start Silver Sneakers program again upon discharge to maintain gains made in PT. Target date: 03/28/15.   Status On-going   PT LONG TERM GOAL #2   Title Pt will improve BERG balance score to 51/56 to  decrease falls risk. Target date: 03/28/15.   Status On-going   PT LONG TERM GOAL #3   Title Pt will improve gait speed to >/=3.18ft/sec. to improve safety of ambulation in the community. Target date: 03/28/15.   Status On-going   PT LONG TERM GOAL #4   Title Pt will ambulate 600' over even/uneven terrain, independently, to improve  functional mobility and to work in garden at home. Target date: 03/28/15.   Status On-going   PT LONG TERM GOAL #5   Title Pt will improve ABC score by 10% to improve quality of life. Target date: 03/28/15.   Status On-going               Plan - 02/17/15 1610    Clinical Impression Statement Pt limited today by SOB and decreased SaO2 on room air, as it decreased to 84-85% after ambulation. Pt required seated rest breaks and pursed lip breathing to increase SaO2 to >92%. Pt ambulated 255.31meters during 6 minute walk test, which is below the norm for her age group (345meters). Pt would continue to benefit from skilled PT to improve endurance and safety during functional mobility. PT sent Dr. Linna Darner message regarding decreased SaO2 during amb.   Pt will benefit from skilled therapeutic intervention in order to improve on the following deficits Abnormal gait;Decreased endurance;Decreased balance;Decreased mobility;Decreased strength;Decreased knowledge of use of DME   Rehab Potential Good   PT Frequency 2x / week   PT Duration 8 weeks   PT Treatment/Interventions ADLs/Self Care Home Management;Gait training;Neuromuscular re-education;Stair training;Biofeedback;Functional mobility training;Patient/family education;Therapeutic activities;DME Instruction;Balance training;Therapeutic exercise;Manual techniques   PT Next Visit Plan Progress dynamic gait and balance training, assess SaO2 on room air.   Consulted and Agree with Plan of Care Patient        Problem List Patient Active Problem List   Diagnosis Date Noted  . Recurrent inguinal hernia with obstruction  11/11/2014  . Depression 09/22/2014  . Hypoxemia 09/22/2014  . Aortic stenosis, mild 08/15/2014  . Chronic anticoagulation 08/15/2014  . Recurrent femoral hernia with obstruction without gangrene 08/15/2014  . History of CVA (cerebrovascular accident) 08/15/2014  . Squamous cell carcinoma of neck 03/09/2014  . Cough 09/16/2012  . Diverticulosis 09/11/2011  . Glaucoma 09/11/2011  . CAROTID STENOSIS 08/08/2009  . HEARING LOSS 11/22/2008  . COPD, Gold grade A 04/08/2008  . HERNIA 04/08/2008  . ARTHRITIS 04/08/2008  . VITAMIN D DEFICIENCY 02/01/2008  . Atrial fibrillation 02/01/2008  . HYPERTENSION, ESSENTIAL NOS 12/01/2007  . OSTEOPOROSIS 12/01/2007  . Hypothyroidism 08/06/2007  . DISEQUILIBRIUM 07/24/2007  . G E R D 06/02/2007  . THYROID NODULE, LEFT 04/10/2007  . HYPERLIPIDEMIA 04/10/2007  . COLONIC POLYPS, HX OF 04/10/2007    Doron Shake L 02/17/2015, 4:14 PM  [facility] [address] [place], [place], [address] Phone: [phone]   Fax:  [phone]     Geoffry Paradise, PT,DPT 02/17/2015 4:14 PM Phone: [phone] Fax: [phone]

## 2015-02-21 ENCOUNTER — Telehealth: Payer: Self-pay

## 2015-02-21 ENCOUNTER — Ambulatory Visit: Payer: Medicare Other | Attending: Internal Medicine

## 2015-02-21 DIAGNOSIS — R269 Unspecified abnormalities of gait and mobility: Secondary | ICD-10-CM

## 2015-02-21 NOTE — Telephone Encounter (Signed)
Kristine Simmons said it is is progress; please verify

## 2015-02-21 NOTE — Telephone Encounter (Signed)
Phone call from Garald Balding with [facility]. He states he sent you his note from 02/08/15. Patient was evaluated for speech therapy and he recommended a barium swallow test. Patient is wondering if she will be having this? Please advise.

## 2015-02-21 NOTE — Telephone Encounter (Signed)
Gave msg to [facility] she will make barium swallow test, and contact carl...Kristine Simmons

## 2015-02-21 NOTE — Telephone Encounter (Signed)
Last order placed was 06/2014.

## 2015-02-21 NOTE — Patient Instructions (Signed)
Walk for 30-45 seconds, then take a seated rest break and measure oxygen with pulse oximeter. If oxygen is below 90% perform pursed lip breathing as described below. Perform walking 5 times a day. When oxygen reading stays above 92%, increase walking time to 1 minute. In order to build up tolerance to walking for longer time periods during the day. Increase walking time by 15 seconds, as long as oxygen remains above 92%.   Pursed Lip Breathing Some long-term respiratory conditions like COPD and asthma can make it hard to release all the air in your lungs when you breathe out (exhale). This can result in oxygen-poor air building up in your lungs (air trapping) and make it hard to fill your lungs with fresh air during each breath. This can also cause you to feel short of breath.  Pursed lip breathing can help to relieve some of this shortness of breath. By keeping your airways open for a longer amount of time during your breath out, you can empty more air out of your lungs. This will make more space for fresh air during the next breath in (inhalation).  HOW TO PERFORM PURSED LIP BREATHING 1. Close your mouth. 2. Breathe in through your nose, taking a normal-sized breath. The rate of breathing in may be your normal rate, or you can try breathing in with a slow count to two or three if you feel you are not getting enough air. 3. Pucker your lips as if you were going to whistle. 4. Gently tighten your stomach muscles to help push the air out. 5. Breathe out slowly through your pursed lips. You should breathe out at least twice as long as you breathe in. 6.  Be sure you breathe out all of the air, but do not force the air out. GET HELP IF: Your shortness of breath gets worse. GET HELP RIGHT AWAY IF:  You are very short of breath. Document Released: 09/17/2008 Document Revised: 04/25/2014 Document Reviewed: 07/21/2013 [facility] Patient Information 2015 [place], [place]. This information is not intended to  replace advice given to you by your health care provider. Make sure you discuss any questions you have with your health care provider.

## 2015-02-21 NOTE — Therapy (Signed)
[facility] [address] [place], [place], [address] Phone: [phone]   Fax:  [phone]  Physical Therapy Treatment  Patient Details  Name: Kristine Simmons MRN: [id number] Date of Birth: 27-Jun-1927 Referring Provider:  Hendricks Limes, MD  Encounter Date: 02/21/2015      PT End of Session - 02/21/15 1311    Visit Number 6   Number of Visits 17   Date for PT Re-Evaluation 04/01/15   Authorization Type G-code every 10th visit.   PT Start Time 1103   PT Stop Time 1143   PT Time Calculation (min) 40 min   Equipment Utilized During Treatment Gait belt   Activity Tolerance Other (comment)  decreased SaO2   Behavior During Therapy WFL for tasks assessed/performed      Past Medical History  Diagnosis Date  . Hx of colonic polyps     last colonoscopy 2005, repeat was due 2010 Dr. Sharlett Iles  . Atrial fibrillation     Dr Percival Spanish  . Fracture 1995, 2009    RUE; LUE Dr. Durward Fortes  . 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 Cystitis   . Complication of anesthesia     difficulty with arousal post op  . Hyperlipidemia   . Hyperlipidemia   . Osteoporosis   . Cerebrovascular accident 01/09  . Hypothyroidism     Past Surgical History  Procedure Laterality Date  . Tonsillectomy and adenoidectomy    . Vocal cord polyps    . Thyroid needle biopsy  2002  . Appendectomy    . Cataract extraction      Bilat  . Colonoscopy w/ polypectomy  2005    Diverticulosis;Dr. Sharlett Iles  . Total hip arthroplasty  2008    CHF & RAF post op   . Total shoulder replacement  2010    Dr Durward Fortes - left shoulder  . Mohs surgery  2013    nasal Basal Cell; Dr Sarajane Jews  . Excision metacarpal mass Right 08/17/2013    Procedure: RIGHT LONG EXCISION MASS AND DIP JOINT DEBRIDEMENT;  Surgeon: Tennis Must, MD;  Location: [facility];  Service: Orthopedics;  Laterality: Right;  . Femoral hernia repair Right  08/17/2014    Procedure: OPEN REPAIR RIGHT FEMORAL HERNIA  WITH INSERTION OF MESH;  Surgeon: Adin Hector, MD;  [facility];  Service: General;  Laterality: Right;  . Insertion of mesh  08/17/2014    Procedure: INSERTION OF MESH;  Surgeon: Adin Hector, MD;  Location[facility];  Service: General;;  . Hernia repair    . Inguinal hernia repair Right 11/15/2014    Procedure: LAPAROSCOPIC RIGHT INGUINAL HERNIA WITH MESH;  Surgeon: Alphonsa Overall, MD;  [facility];  Service: General;  Laterality: Right;    There were no vitals taken for this visit.  Visit Diagnosis:  Abnormality of gait      Subjective Assessment - 02/21/15 1119    Symptoms Pt denied falls since last visit. Pt reported she has an appt. with the pulmonologist on 03/07/15, per Dr. Clayborn Heron order. Pt continues to feel fatigued and reported SaO2 on room air did drop to 86% while performing exercises. Pt reported she received a new nasal canula from DME supply company.   Patient Stated Goals "Be more stable on my feet", decrease fear of falling, increase strength   Currently in Pain? No/denies  [facility] Adult PT Treatment/Exercise - 02/21/15 1133    Ambulation/Gait   Ambulation/Gait Yes   Ambulation/Gait Assistance 5: Supervision;4: Min guard   Ambulation/Gait Assistance Details Pt ambulated over even terrain while performing head turns. SaO2 92% prior to ambulation and 89%-90% after 117' of ambulation. Pt required seated rest break after each bout of amb. to incr. SaO2 to >/=92%. VC's to improve B heel strike and to stay on straight path, as pt has tendency to veer off path during head turns.    Ambulation Distance (Feet) --  117'x2, 37'   Assistive device None   Gait Pattern Step-through pattern;Decreased stride length;Narrow base of support;Decreased trunk rotation   Ambulation Surface Level;Indoor                PT Education - 02/21/15 1308    Education Details Walking  program and pursed lip breathing handouts provided to pt. PT educated pt and pt's husband on pt's SaO2 decreasing and ways to improve it. Pt also educated pt on how decreased SaO2 can cause fatigue due to lack of O2 to body/tissues. PT educated pt on the importance of taking rest breaks during OTAGO, house hold chores, and amb; in order to measure SaO2. PT also related speech therapist's message to pt regarding: he spoke with Dr. Clayborn Heron office [facility]ie Bun) to clarify he is recommending a modified swallowing test.   Person(s) Educated Patient;Spouse   Methods Explanation;Verbal cues;Handout;Demonstration   Comprehension Verbalized understanding;Need further instruction;Returned demonstration          PT Short Term Goals - 02/07/15 1159    PT SHORT TERM GOAL #1   Title Pt will be independent in HEP to improve functional mobility. Target date: 02/28/15.   Status On-going   PT SHORT TERM GOAL #2   Title Pt will verbalize understanding of fall prevention strategies to decrease falls risk. Target date: 02/28/15.   Status On-going           PT Long Term Goals - 02/07/15 1159    PT LONG TERM GOAL #1   Title Pt will verbalize plans to start Silver Sneakers program again upon discharge to maintain gains made in PT. Target date: 03/28/15.   Status On-going   PT LONG TERM GOAL #2   Title Pt will improve BERG balance score to 51/56 to decrease falls risk. Target date: 03/28/15.   Status On-going   PT LONG TERM GOAL #3   Title Pt will improve gait speed to >/=3.36ft/sec. to improve safety of ambulation in the community. Target date: 03/28/15.   Status On-going   PT LONG TERM GOAL #4   Title Pt will ambulate 600' over even/uneven terrain, independently, to improve functional mobility and to work in garden at home. Target date: 03/28/15.   Status On-going   PT LONG TERM GOAL #5   Title Pt will improve ABC score by 10% to improve quality of life. Target date: 03/28/15.   Status On-going                Plan - 02/21/15 1311    Clinical Impression Statement Pt continues to be limited by SOB and decreased SaO2 during activity, see ambulation notes. PT spent extensive time educating pt and pt's husband on the importnace of going to pulmonologist as ordered by Dr. Linna Darner. Pt required seated rest breaks after each bout of amb. due to SOB and decr. SaO2 on room air. Continue with POC.   Pt will benefit from skilled therapeutic intervention in order to  improve on the following deficits Abnormal gait;Decreased endurance;Decreased balance;Decreased mobility;Decreased strength;Decreased knowledge of use of DME   Rehab Potential Good   PT Frequency 2x / week   PT Duration 8 weeks   PT Treatment/Interventions ADLs/Self Care Home Management;Gait training;Neuromuscular re-education;Stair training;Biofeedback;Functional mobility training;Patient/family education;Therapeutic activities;DME Instruction;Balance training;Therapeutic exercise;Manual techniques   PT Next Visit Plan Progress dynamic gait and balance training, assess SaO2 on room air.   Consulted and Agree with Plan of Care Patient;Family member/caregiver        Problem List Patient Active Problem List   Diagnosis Date Noted  . Recurrent inguinal hernia with obstruction 11/11/2014  . Depression 09/22/2014  . Hypoxemia 09/22/2014  . Aortic stenosis, mild 08/15/2014  . Chronic anticoagulation 08/15/2014  . Recurrent femoral hernia with obstruction without gangrene 08/15/2014  . History of CVA (cerebrovascular accident) 08/15/2014  . Squamous cell carcinoma of neck 03/09/2014  . Cough 09/16/2012  . Diverticulosis 09/11/2011  . Glaucoma 09/11/2011  . CAROTID STENOSIS 08/08/2009  . HEARING LOSS 11/22/2008  . COPD, Gold grade A 04/08/2008  . HERNIA 04/08/2008  . ARTHRITIS 04/08/2008  . VITAMIN D DEFICIENCY 02/01/2008  . Atrial fibrillation 02/01/2008  . HYPERTENSION, ESSENTIAL NOS 12/01/2007  . OSTEOPOROSIS 12/01/2007  . Hypothyroidism  08/06/2007  . DISEQUILIBRIUM 07/24/2007  . G E R D 06/02/2007  . THYROID NODULE, LEFT 04/10/2007  . HYPERLIPIDEMIA 04/10/2007  . COLONIC POLYPS, HX OF 04/10/2007    Miller,Jennifer L 02/21/2015, 1:14 PM  [facility] [address] [place], [place], [address] Phone: [phone]   Fax:  [phone]     Geoffry Paradise, PT,DPT 02/21/2015 1:14 PM Phone: [phone] Fax: [phone]

## 2015-02-21 NOTE — Telephone Encounter (Signed)
Previously ordered ; I am waiting on Schedulers & insurance

## 2015-02-22 NOTE — Telephone Encounter (Signed)
Pt scheduled at [facility] 03/02/15  @ 10:30am / 10:15am arrival, npo after 730am, Lm on hm vm, awaiting return call .

## 2015-02-24 ENCOUNTER — Other Ambulatory Visit: Payer: Self-pay | Admitting: Internal Medicine

## 2015-02-24 ENCOUNTER — Telehealth: Payer: Self-pay

## 2015-02-24 ENCOUNTER — Ambulatory Visit: Payer: Medicare Other

## 2015-02-24 DIAGNOSIS — R269 Unspecified abnormalities of gait and mobility: Secondary | ICD-10-CM | POA: Diagnosis not present

## 2015-02-24 DIAGNOSIS — R059 Cough, unspecified: Secondary | ICD-10-CM

## 2015-02-24 DIAGNOSIS — R1310 Dysphagia, unspecified: Secondary | ICD-10-CM

## 2015-02-24 DIAGNOSIS — R05 Cough: Secondary | ICD-10-CM

## 2015-02-24 NOTE — Telephone Encounter (Signed)
please

## 2015-02-24 NOTE — Telephone Encounter (Signed)
Phone call from Almyra Deforest, speech language pathologist. He states what he recommended for the patient is a modified barium swallow evaluation not a barium swallow eval. Do you want patient to have a modified barium swallow and cancel the barium swallow. He said you can see his note from 02/07/15

## 2015-02-24 NOTE — Telephone Encounter (Signed)
Phone call to patient. I advised her the wrong test was ordered. She's aware not to go to the test that was scheduled on 03/02/15. She will get a call from [facility] with a new date for the modified barium swallow

## 2015-02-24 NOTE — Telephone Encounter (Signed)
Lorre Nick , can you work Magazine features editor & show Lysbeth Penner?

## 2015-02-24 NOTE — Telephone Encounter (Signed)
Place order for Modified Barium Swallow...Johny Chess

## 2015-02-24 NOTE — Patient Instructions (Signed)

## 2015-02-24 NOTE — Telephone Encounter (Signed)
Dr Linna Darner I do not know how to place this order. Do you?

## 2015-02-24 NOTE — Therapy (Signed)
[facility] [address] [place], [place], [address] Phone: [phone]   Fax:  [phone]  Physical Therapy Treatment  Patient Details  Name: Kristine Simmons MRN: [id number] Date of Birth: 11-08-1927 Referring Provider:  Hendricks Limes, MD  Encounter Date: 02/24/2015      PT End of Session - 02/24/15 1150    Visit Number 7   Number of Visits 17   Date for PT Re-Evaluation 04/01/15   Authorization Type G-code every 10th visit.   PT Start Time 1103   PT Stop Time 1146   PT Time Calculation (min) 43 min   Equipment Utilized During Treatment Gait belt   Activity Tolerance Other (comment)  limited by decreased. SaO2   Behavior During Therapy [facility] for tasks assessed/performed      Past Medical History  Diagnosis Date  . Hx of colonic polyps     last colonoscopy 2005, repeat was due 2010 Dr. Sharlett Iles  . Atrial fibrillation     Dr Percival Spanish  . Fracture 1995, 2009    RUE; LUE Dr. Durward Fortes  . 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 Cystitis   . Complication of anesthesia     difficulty with arousal post op  . Hyperlipidemia   . Hyperlipidemia   . Osteoporosis   . Cerebrovascular accident 01/09  . Hypothyroidism     Past Surgical History  Procedure Laterality Date  . Tonsillectomy and adenoidectomy    . Vocal cord polyps    . Thyroid needle biopsy  2002  . Appendectomy    . Cataract extraction      Bilat  . Colonoscopy w/ polypectomy  2005    Diverticulosis;Dr. Sharlett Iles  . Total hip arthroplasty  2008    CHF & RAF post op   . Total shoulder replacement  2010    Dr Durward Fortes - left shoulder  . Mohs surgery  2013    nasal Basal Cell; Dr Sarajane Jews  . Excision metacarpal mass Right 08/17/2013    Procedure: RIGHT LONG EXCISION MASS AND DIP JOINT DEBRIDEMENT;  Surgeon: Tennis Must, MD;  Location: [facility];  Service: Orthopedics;  Laterality: Right;  . Femoral hernia  repair Right 08/17/2014    Procedure: OPEN REPAIR RIGHT FEMORAL HERNIA  WITH INSERTION OF MESH;  Surgeon: Adin Hector, MD;  Location: [facility];  Service: General;  Laterality: Right;  . Insertion of mesh  08/17/2014    Procedure: INSERTION OF MESH;  Surgeon: Adin Hector, MD;  Location: [facility];  Service: General;;  . Hernia repair    . Inguinal hernia repair Right 11/15/2014    Procedure: LAPAROSCOPIC RIGHT INGUINAL HERNIA WITH MESH;  Surgeon: Alphonsa Overall, MD;  Location: [facility];  Service: General;  Laterality: Right;    There were no vitals taken for this visit.  Visit Diagnosis:  Abnormality of gait      Subjective Assessment - 02/24/15 1105    Symptoms Pt denied falls or changes since last visit. Pt has been keeping record of SaO2 and HR while ambulating for 45-60 seconds. Pt reported the new nasal canula/tubing is working better at night, it stays on during the night.    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 No/denies                    [facility] Adult PT Treatment/Exercise - 02/24/15 1133    Balance   Balance Assessed Yes   Dynamic Standing Balance   Dynamic Standing - Balance Support No upper extremity supported   Dynamic Standing - Level of Assistance 4: Min assist;Other (comment)  min guard   Dynamic Standing - Balance Activities Lateral lean/weight shifting;Other (comment)  6" cone taps   Dynamic Standing - Comments B LE cone taps, single, double and triple taps (2x5cones/LE/activity). VC's to improve lat. weight shifting. 5 LOB episodes required min A to maintain balance. SaO2 decreased to 87-89% after each set of cone taps, pt required seated rest breaks and pursed lip breathing for SaO2 to >92%. SaO2 on room  air was 96% at beginning of session.                PT Education - 02/24/15 1125    Education provided Yes   Education Details Falls prevention handout. PT educated pt on decreasing walking time from 60 seconds to 30-45 seconds to increase SaO2. Discussed barium swallow test with Glendell Docker, and he wants a modified barium not the current barium swallow test that is ordered, Glendell Docker will contact Dr. Clayborn Heron office.    Person(s) Educated Patient   Methods Explanation   Comprehension Verbalized understanding          PT Short Term Goals - 02/07/15 1159    PT SHORT TERM GOAL #1   Title Pt will be independent in HEP to improve functional mobility. Target date: 02/28/15.   Status On-going   PT SHORT TERM GOAL #2   Title Pt will verbalize understanding of fall prevention strategies to decrease falls risk. Target date: 02/28/15.   Status On-going           PT Long Term Goals - 02/07/15 1159    PT LONG TERM GOAL #1   Title Pt will verbalize plans to start Silver Sneakers program again upon discharge to maintain gains made in PT. Target date: 03/28/15.   Status On-going   PT LONG TERM GOAL #2   Title Pt will improve BERG balance score to 51/56 to decrease falls risk. Target date: 03/28/15.   Status On-going   PT LONG TERM GOAL #3   Title Pt will improve gait speed to >/=3.34ft/sec. to improve safety of ambulation in the community. Target date: 03/28/15.   Status On-going   PT LONG TERM GOAL #4   Title Pt will ambulate 600' over even/uneven terrain, independently, to improve functional mobility and to work in garden at home. Target date: 03/28/15.   Status On-going   PT LONG TERM GOAL #5   Title Pt will improve ABC score by 10% to improve quality of life. Target date: 03/28/15.   Status On-going               Plan - 02/24/15 1311    Clinical Impression Statement Pt continues to be limited by decresaed SaO2 during activity, please see balance notes for detail. Pt required frequent seated  rest breaks during activity to increase SaO2 on room air to >92%. Pt required frequent cues to improve lateral weight shifting during single limb activities to maintain balance without min A. Continue with POC.   Pt will benefit from skilled therapeutic intervention in order to improve on the  following deficits Abnormal gait;Decreased endurance;Decreased balance;Decreased mobility;Decreased strength;Decreased knowledge of use of DME   Rehab Potential Good   PT Frequency 2x / week   PT Duration 8 weeks   PT Treatment/Interventions ADLs/Self Care Home Management;Gait training;Neuromuscular re-education;Stair training;Biofeedback;Functional mobility training;Patient/family education;Therapeutic activities;DME Instruction;Balance training;Therapeutic exercise;Manual techniques   PT Next Visit Plan Check STGs. Progress dynamic gait and balance training, assess SaO2 on room air.   Consulted and Agree with Plan of Care Patient        Problem List Patient Active Problem List   Diagnosis Date Noted  . Recurrent inguinal hernia with obstruction 11/11/2014  . Depression 09/22/2014  . Hypoxemia 09/22/2014  . Aortic stenosis, mild 08/15/2014  . Chronic anticoagulation 08/15/2014  . Recurrent femoral hernia with obstruction without gangrene 08/15/2014  . History of CVA (cerebrovascular accident) 08/15/2014  . Squamous cell carcinoma of neck 03/09/2014  . Cough 09/16/2012  . Diverticulosis 09/11/2011  . Glaucoma 09/11/2011  . CAROTID STENOSIS 08/08/2009  . HEARING LOSS 11/22/2008  . COPD, Gold grade A 04/08/2008  . HERNIA 04/08/2008  . ARTHRITIS 04/08/2008  . VITAMIN D DEFICIENCY 02/01/2008  . Atrial fibrillation 02/01/2008  . HYPERTENSION, ESSENTIAL NOS 12/01/2007  . OSTEOPOROSIS 12/01/2007  . Hypothyroidism 08/06/2007  . DISEQUILIBRIUM 07/24/2007  . G E R D 06/02/2007  . THYROID NODULE, LEFT 04/10/2007  . HYPERLIPIDEMIA 04/10/2007  . COLONIC POLYPS, HX OF 04/10/2007    Ikeisha Blumberg  L 02/24/2015, 1:13 PM  [facility] [address] [place], [place], [address] Phone: [phone]   Fax:  [phone]     Geoffry Paradise, PT,DPT 02/24/2015 1:14 PM Phone: [phone] Fax: [phone]

## 2015-02-26 NOTE — Telephone Encounter (Signed)
I appreciate you so much; you are incredibly clinically capable in navigating the nuances of EPIC and a boon to the patients & Villanueva. Thanks, [employer]

## 2015-02-27 ENCOUNTER — Other Ambulatory Visit: Payer: Self-pay | Admitting: Internal Medicine

## 2015-02-28 ENCOUNTER — Ambulatory Visit: Payer: Medicare Other | Admitting: Physical Therapy

## 2015-02-28 ENCOUNTER — Encounter: Payer: Self-pay | Admitting: Physical Therapy

## 2015-02-28 ENCOUNTER — Other Ambulatory Visit (HOSPITAL_COMMUNITY): Payer: Self-pay | Admitting: Internal Medicine

## 2015-02-28 DIAGNOSIS — R269 Unspecified abnormalities of gait and mobility: Secondary | ICD-10-CM

## 2015-02-28 DIAGNOSIS — R1310 Dysphagia, unspecified: Secondary | ICD-10-CM

## 2015-02-28 NOTE — Therapy (Signed)
[facility] [address] [place], [place], [address] Phone: [phone]   Fax:  [phone]  Physical Therapy Treatment  Patient Details  Name: Kristine Simmons MRN: [id number] Date of Birth: May 09, 1927 Referring Provider:  Hendricks Limes, MD  Encounter Date: 02/28/2015      PT End of Session - 02/28/15 1315    Visit Number 8   Number of Visits 17   Date for PT Re-Evaluation 04/01/15   Authorization Type G-code every 10th visit.   PT Start Time 1108   PT Stop Time 1148   PT Time Calculation (min) 40 min   Behavior During Therapy WFL for tasks assessed/performed      Past Medical History  Diagnosis Date  . Hx of colonic polyps     last colonoscopy 2005, repeat was due 2010 Dr. Sharlett Iles  . Atrial fibrillation     Dr Percival Spanish  . Fracture 1995, 2009    RUE; LUE Dr. Durward Fortes  . 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 Cystitis   . Complication of anesthesia     difficulty with arousal post op  . Hyperlipidemia   . Hyperlipidemia   . Osteoporosis   . Cerebrovascular accident 01/09  . Hypothyroidism     Past Surgical History  Procedure Laterality Date  . Tonsillectomy and adenoidectomy    . Vocal cord polyps    . Thyroid needle biopsy  2002  . Appendectomy    . Cataract extraction      Bilat  . Colonoscopy w/ polypectomy  2005    Diverticulosis;Dr. Sharlett Iles  . Total hip arthroplasty  2008    CHF & RAF post op   . Total shoulder replacement  2010    Dr Durward Fortes - left shoulder  . Mohs surgery  2013    nasal Basal Cell; Dr Sarajane Jews  . Excision metacarpal mass Right 08/17/2013    Procedure: RIGHT LONG EXCISION MASS AND DIP JOINT DEBRIDEMENT;  Surgeon: Tennis Must, MD;  Location: [facility];  Service: Orthopedics;  Laterality: Right;  . Femoral hernia repair Right 08/17/2014    Procedure: OPEN REPAIR RIGHT FEMORAL HERNIA  WITH INSERTION OF MESH;  Surgeon: Adin Hector, MD;  Location: [facility];  Service: General;  Laterality: Right;  . Insertion of mesh  08/17/2014    Procedure: INSERTION OF MESH;  Surgeon: Adin Hector, MD;  Location: [facility];  Service: General;;  . Hernia repair    . Inguinal hernia repair Right 11/15/2014    Procedure: LAPAROSCOPIC RIGHT INGUINAL HERNIA WITH MESH;  Surgeon: Alphonsa Overall, MD;  Location: [facility];  Service: General;  Laterality: Right;    There were no vitals taken for this visit.  Visit Diagnosis:  Abnormality of gait      Subjective Assessment - 02/28/15 1110    Symptoms Pt denies falls or changes since last visit.  Continues to keep record of SaO2 and lowest reading was 87% with most values 89-91%.   Currently in Pain? No/denies                         Balance Exercises - 02/28/15 1129    OTAGO PROGRAM   Head Movements Standing;5 reps   Neck Movements Standing;5 reps   Back Extension Standing;5 reps   Trunk Movements Standing;5 reps   Ankle Movements Sitting;10 reps   Knee Extensor 10 reps;Weight (comment)  2#   Knee Flexor 10 reps;Weight (comment)  2#   Hip ABductor 10 reps;Weight (comment)  2#   Ankle Plantorflexors 20 reps, support   Ankle Dorsiflexors 20 reps, support   Knee Bends 20 reps, support   Backwards Walking No support   Walking and Turning Around No assistive device   Sideways Walking No assistive device   Tandem Stance 10 seconds, support  1 finger support without LOB but without UE has frequent LOB   Tandem Walk Support   One Leg Stand 10 seconds, support   Heel Walking Support   Toe Walk No support   Heel Toe Walking Backward No support  with support     Pt SaO2 91-95% at rest but did drop on one occasion to 85% with exercises at counter.  Pt on room air and recovers to >90% within 20 seconds. Compared pt's personal SaO2 monitor to clinic monitor and both were reading the same.      PT Education - 02/28/15 1315    Education provided Yes   Education  Details Reviewed OTAGO HEP.  Progress toward goals. SaO2 during activities/exercise.   Person(s) Educated Patient   Methods Explanation;Demonstration   Comprehension Verbalized understanding;Returned demonstration          PT Short Term Goals - 02/28/15 1317    PT SHORT TERM GOAL #1   Title Pt will be independent in HEP to improve functional mobility. Target date: 02/28/15.   Status Achieved   PT SHORT TERM GOAL #2   Title Pt will verbalize understanding of fall prevention strategies to decrease falls risk. Target date: 02/28/15.   Status Achieved           PT Long Term Goals - 02/07/15 1159    PT LONG TERM GOAL #1   Title Pt will verbalize plans to start Silver Sneakers program again upon discharge to maintain gains made in PT. Target date: 03/28/15.   Status On-going   PT LONG TERM GOAL #2   Title Pt will improve BERG balance score to 51/56 to decrease falls risk. Target date: 03/28/15.   Status On-going   PT LONG TERM GOAL #3   Title Pt will improve gait speed to >/=3.79f/sec. to improve safety of ambulation in the community. Target date: 03/28/15.   Status On-going   PT LONG TERM GOAL #4   Title Pt will ambulate 600' over even/uneven terrain, independently, to improve functional mobility and to work in garden at home. Target date: 03/28/15.   Status On-going   PT LONG TERM GOAL #5   Title Pt will improve ABC score by 10% to improve quality of life. Target date: 03/28/15.   Status On-going               Plan - 02/28/15 1316    Clinical Impression Statement Pt met STG 1 and 2.  Continues to be fearful of falling.  Continue PT per POC.   Pt will benefit from skilled therapeutic intervention in order to improve on the following deficits Abnormal gait;Decreased endurance;Decreased balance;Decreased mobility;Decreased strength;Decreased knowledge of use of DME   Rehab Potential Good   PT Frequency 2x / week   PT Duration 8 weeks   PT Treatment/Interventions ADLs/Self Care Home  Management;Gait training;Neuromuscular re-education;Stair training;Biofeedback;Functional mobility training;Patient/family education;Therapeutic activities;DME Instruction;Balance training;Therapeutic exercise;Manual techniques   PT Next Visit Plan Continue with gait and balance while monitoring SaO2.   Consulted and Agree with Plan of Care Patient  Problem List Patient Active Problem List   Diagnosis Date Noted  . Recurrent inguinal hernia with obstruction 11/11/2014  . Depression 09/22/2014  . Hypoxemia 09/22/2014  . Aortic stenosis, mild 08/15/2014  . Chronic anticoagulation 08/15/2014  . Recurrent femoral hernia with obstruction without gangrene 08/15/2014  . History of CVA (cerebrovascular accident) 08/15/2014  . Squamous cell carcinoma of neck 03/09/2014  . Cough 09/16/2012  . Diverticulosis 09/11/2011  . Glaucoma 09/11/2011  . CAROTID STENOSIS 08/08/2009  . HEARING LOSS 11/22/2008  . COPD, Gold grade A 04/08/2008  . HERNIA 04/08/2008  . ARTHRITIS 04/08/2008  . VITAMIN D DEFICIENCY 02/01/2008  . Atrial fibrillation 02/01/2008  . HYPERTENSION, ESSENTIAL NOS 12/01/2007  . OSTEOPOROSIS 12/01/2007  . Hypothyroidism 08/06/2007  . DISEQUILIBRIUM 07/24/2007  . G E R D 06/02/2007  . THYROID NODULE, LEFT 04/10/2007  . HYPERLIPIDEMIA 04/10/2007  . COLONIC POLYPS, HX OF 04/10/2007    Narda Bonds 02/28/2015, 1:18 PM  [facility] [address] [place], [place], [address] Phone: [phone]   Fax:  [place], [place] 02/28/2015 1:18 PM Phone: [phone] Fax: [phone]

## 2015-03-01 ENCOUNTER — Ambulatory Visit (HOSPITAL_COMMUNITY)
Admission: RE | Admit: 2015-03-01 | Discharge: 2015-03-01 | Disposition: A | Discharge status: Home / Self Care | Payer: Medicare Other | Source: Ambulatory Visit | Attending: Internal Medicine | Admitting: Internal Medicine

## 2015-03-01 DIAGNOSIS — R1310 Dysphagia, unspecified: Secondary | ICD-10-CM

## 2015-03-01 DIAGNOSIS — R059 Cough, unspecified: Secondary | ICD-10-CM

## 2015-03-01 DIAGNOSIS — R1313 Dysphagia, pharyngeal phase: Secondary | ICD-10-CM | POA: Diagnosis not present

## 2015-03-01 DIAGNOSIS — E1310 Other specified diabetes mellitus with ketoacidosis without coma: Secondary | ICD-10-CM | POA: Diagnosis present

## 2015-03-01 DIAGNOSIS — R05 Cough: Secondary | ICD-10-CM

## 2015-03-01 NOTE — Procedures (Signed)
Objective Swallowing Evaluation: Modified Barium Swallowing Study  Patient Details  Name: Kristine Simmons MRN: [id number] Date of Birth: 12-13-1927  Today's Date: 03/01/2015 Time: SLP Start Time (ACUTE ONLY): 1100-SLP Stop Time (ACUTE ONLY): 1135 SLP Time Calculation (min) (ACUTE ONLY): 35 min  Past Medical History:  Past Medical History  Diagnosis Date  . Hx of colonic polyps     last colonoscopy 2005, repeat was due 2010 Dr. Sharlett Iles  . Atrial fibrillation     Dr Percival Spanish  . Fracture 1995, 2009    RUE; LUE Dr. Durward Fortes  . 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 Cystitis   . Complication of anesthesia     difficulty with arousal post op  . Hyperlipidemia   . Hyperlipidemia   . Osteoporosis   . Cerebrovascular accident 01/09  . Hypothyroidism    Past Surgical History:  Past Surgical History  Procedure Laterality Date  . Tonsillectomy and adenoidectomy    . Vocal cord polyps    . Thyroid needle biopsy  2002  . Appendectomy    . Cataract extraction      Bilat  . Colonoscopy w/ polypectomy  2005    Diverticulosis;Dr. Sharlett Iles  . Total hip arthroplasty  2008    CHF & RAF post op   . Total shoulder replacement  2010    Dr Durward Fortes - left shoulder  . Mohs surgery  2013    nasal Basal Cell; Dr Sarajane Jews  . Excision metacarpal mass Right 08/17/2013    Procedure: RIGHT LONG EXCISION MASS AND DIP JOINT DEBRIDEMENT;  Surgeon: Tennis Must, MD;  Location: [facility];  Service: Orthopedics;  Laterality: Right;  . Femoral hernia repair Right 08/17/2014    Procedure: OPEN REPAIR RIGHT FEMORAL HERNIA  WITH INSERTION OF MESH;  Surgeon: Adin Hector, MD;  Location: [facility];  Service: General;  Laterality: Right;  . Insertion of mesh  08/17/2014    Procedure: INSERTION OF MESH;  Surgeon: Adin Hector, MD;  Location: [facility];  Service: General;;  . Hernia repair    . Inguinal hernia repair Right 11/15/2014    Procedure: LAPAROSCOPIC RIGHT  INGUINAL HERNIA WITH MESH;  Surgeon: Alphonsa Overall, MD;  Location: [facility];  Service: General;  Laterality: Right;   HPI:  HPI: Pt is 81 yr old female seen for outpatient MBS with complaints coughing during meals, frequent mucous with expectoration and difficulty swalllowing pills. History of severe esophageal dysmotility, Zenker's diverticulum and reflux diagnosed during barium esophagram 05/2014.  Additional PMH includes skin cancer, stroke, A-fib, glaucoma. Pt denies recent pna.  No Data Recorded  Assessment / Plan / Recommendation CHL IP CLINICAL IMPRESSIONS 03/01/2015  Dysphagia Diagnosis (None)  Clinical impression Pt demonstrated a mild pharyngeal dysphagia with delayed swallow initiaton due to mildly decreased sensation and mild-moderate (moderate in pyriform sinuses) residue from reduced tongue base retraction and decreased laryngeal elevation. Pt aware of residue inconsistently and performs second swallow to significantly reduce residue. One episode of flash laryngeal penetration with cup sip thin (toward end of study). Esophagus scanned showing barium residual in distal esophagus with delayed clearance. Recommend pt continue regular texture (using caution with known problematic foods, tender meats with gravy, caution with breads) and thin liquids, small straw sips allowed although educated pt on increased risk with straws, meds whole in applesauce.  Educated/review esophageal precautions with pt and provided written handout.        CHL IP TREATMENT RECOMMENDATION  03/01/2015  Treatment Plan Recommendations Defer treatment plan to SLP at (Comment)     CHL IP DIET RECOMMENDATION 03/01/2015  Diet Recommendations Regular;Thin liquid  Liquid Administration via Cup;Straw  Medication Administration Whole meds with puree  Compensations Slow rate;Small sips/bites;Follow solids with liquid;Multiple dry swallows after each bite/sip  Postural Changes and/or Swallow Maneuvers Seated upright 90  degrees;Upright 30-60 min after meal     CHL IP OTHER RECOMMENDATIONS 03/01/2015  Recommended Consults (None)  Oral Care Recommendations Oral care BID  Other Recommendations (None)     CHL IP FOLLOW UP RECOMMENDATIONS 03/01/2015  Follow up Recommendations (No Data)     No flowsheet data found.   Pertinent Vitals/Pain none    SLP Swallow Goals No flowsheet data found.  No flowsheet data found.    CHL IP REASON FOR REFERRAL 03/01/2015  Reason for Referral Objectively evaluate swallowing function     CHL IP ORAL PHASE 03/01/2015  Lips (None)  Tongue (None)  Mucous membranes (None)  Nutritional status (None)  Other (None)  Oxygen therapy (None)  Oral Phase WFL  Oral - Pudding Teaspoon (None)  Oral - Pudding Cup (None)  Oral - Honey Teaspoon (None)  Oral - Honey Cup (None)  Oral - Honey Syringe (None)  Oral - Nectar Teaspoon (None)  Oral - Nectar Cup (None)  Oral - Nectar Straw (None)  Oral - Nectar Syringe (None)  Oral - Ice Chips (None)  Oral - Thin Teaspoon (None)  Oral - Thin Cup (None)  Oral - Thin Straw (None)  Oral - Thin Syringe (None)  Oral - Puree (None)  Oral - Mechanical Soft (None)  Oral - Regular (None)  Oral - Multi-consistency (None)  Oral - Pill (None)  Oral Phase - Comment (None)      CHL IP PHARYNGEAL PHASE 03/01/2015  Pharyngeal Phase Impaired  Pharyngeal - Pudding Teaspoon (None)  Penetration/Aspiration details (pudding teaspoon) (None)  Pharyngeal - Pudding Cup (None)  Penetration/Aspiration details (pudding cup) (None)  Pharyngeal - Honey Teaspoon (None)  Penetration/Aspiration details (honey teaspoon) (None)  Pharyngeal - Honey Cup (None)  Penetration/Aspiration details (honey cup) (None)  Pharyngeal - Honey Syringe (None)  Penetration/Aspiration details (honey syringe) (None)  Pharyngeal - Nectar Teaspoon (None)  Penetration/Aspiration details (nectar teaspoon) (None)  Pharyngeal - Nectar Cup (None)  Penetration/Aspiration details (nectar  cup) (None)  Pharyngeal - Nectar Straw (None)  Penetration/Aspiration details (nectar straw) (None)  Pharyngeal - Nectar Syringe (None)  Penetration/Aspiration details (nectar syringe) (None)  Pharyngeal - Ice Chips (None)  Penetration/Aspiration details (ice chips) (None)  Pharyngeal - Thin Teaspoon (None)  Penetration/Aspiration details (thin teaspoon) (None)  Pharyngeal - Thin Cup Pharyngeal residue - pyriform sinuses;Pharyngeal residue - valleculae;Reduced laryngeal elevation;Reduced tongue base retraction;Penetration/Aspiration during swallow;Delayed swallow initiation;Premature spillage to pyriform sinuses  Penetration/Aspiration details (thin cup) Material enters airway, remains ABOVE vocal cords then ejected out  Pharyngeal - Thin Straw Pharyngeal residue - pyriform sinuses;Pharyngeal residue - valleculae;Reduced laryngeal elevation;Reduced tongue base retraction;Delayed swallow initiation;Premature spillage to pyriform sinuses  Penetration/Aspiration details (thin straw) (None)  Pharyngeal - Thin Syringe (None)  Penetration/Aspiration details (thin syringe') (None)  Pharyngeal - Puree NT  Penetration/Aspiration details (puree) (None)  Pharyngeal - Mechanical Soft (None)  Penetration/Aspiration details (mechanical soft) (None)  Pharyngeal - Regular WFL  Penetration/Aspiration details (regular) (None)  Pharyngeal - Multi-consistency (None)  Penetration/Aspiration details (multi-consistency) (None)  Pharyngeal - Pill (No Data)  Penetration/Aspiration details (pill) (None)  Pharyngeal Comment (None)     CHL IP CERVICAL ESOPHAGEAL PHASE 03/01/2015  Cervical  Esophageal Phase (No Data)  Pudding Teaspoon (None)  Pudding Cup (None)  Honey Teaspoon (None)  Honey Cup (None)  Honey Syringe (None)  Nectar Teaspoon (None)  Nectar Cup (None)  Nectar Straw (None)  Nectar Syringe (None)  Thin Teaspoon (None)  Thin Cup (None)  Thin Straw (None)  Thin Syringe (None)  Cervical Esophageal  Comment (None)    CHL IP GO 03/01/2015  Functional Assessment Tool Used clinical judgement  Functional Limitations Swallowing  Swallow Current Status (X9371) CJ  Swallow Goal Status (I9678) CJ  Swallow Discharge Status (L3810) CJ  Motor Speech Current Status (F7510) (None)  Motor Speech Goal Status (C5852) (None)  Motor Speech Goal Status (D7824) (None)  Spoken Language Comprehension Current Status (M3536) (None)  Spoken Language Comprehension Goal Status (R4431) (None)  Spoken Language Comprehension Discharge Status [phone]) (None)  Spoken Language Expression Current Status (Q7619) (None)  Spoken Language Expression Goal Status (J0932) (None)  Spoken Language Expression Discharge Status [phone]) (None)  Attention Current Status (P8099) (None)  Attention Goal Status (I3382) (None)  Attention Discharge Status (N0539) (None)  Memory Current Status (J6734) (None)  Memory Goal Status (L9379) (None)  Memory Discharge Status (K2409) (None)  Voice Current Status (B3532) (None)  Voice Goal Status (D9242) (None)  Voice Discharge Status (A8341) (None)  Other Speech-Language Pathology Functional Limitation [phone]) (None)  Other Speech-Language Pathology Functional Limitation Goal Status (L7989) (None)  Other Speech-Language Pathology Functional Limitation Discharge Status [phone]) (None)           Houston Siren 03/01/2015, 2:38 PM  Orbie Pyo Colvin Caroli[employer] [phone]

## 2015-03-02 ENCOUNTER — Ambulatory Visit (HOSPITAL_COMMUNITY): Payer: Medicare Other

## 2015-03-03 ENCOUNTER — Ambulatory Visit: Payer: Medicare Other

## 2015-03-03 DIAGNOSIS — R269 Unspecified abnormalities of gait and mobility: Secondary | ICD-10-CM | POA: Diagnosis not present

## 2015-03-03 NOTE — Patient Instructions (Addendum)
Increase your walking time from 60 seconds to 70 seconds for a total of 5 times, as long as your oxygen stays >90% and you don't get short of breath. Please take seated rest breaks (at least 30 seconds, or until oxygen is above 90%) after each bout of walking, and measure your oxygen, stay seated and perform pursed lip breathing if oxygen is below 90%.  When walking remember to breathe in through your nose and out through your mouth, as you tend to breath through your mouth only.    Abduction: Clam - Side-Lying   Lie on side with knees bent. Lift top knee, keeping feet together. Keep trunk steady. Slowly lower down. _10__ reps per set, _3__ sets per day, _3__ days per week. Repeat with other leg. Progress by adding band around your knees.  Copyright  VHI. All rights reserved.

## 2015-03-03 NOTE — Therapy (Signed)
[facility] [address] [place], [place], [address] Phone: [phone]   Fax:  [phone]  Physical Therapy Treatment  Patient Details  Name: Kristine Simmons MRN: [id number] Date of Birth: 1927/02/10 Referring Provider:  Hendricks Limes, MD  Encounter Date: 03/03/2015      PT End of Session - 03/03/15 1310    Visit Number 9   Number of Visits 17   Date for PT Re-Evaluation 04/01/15   Authorization Type G-code every 10th visit.   PT Start Time 1057   PT Stop Time 1142   PT Time Calculation (min) 45 min   Equipment Utilized During Treatment Gait belt   Activity Tolerance Patient tolerated treatment well   Behavior During Therapy WFL for tasks assessed/performed      Past Medical History  Diagnosis Date  . Hx of colonic polyps     last colonoscopy 2005, repeat was due 2010 Dr. Sharlett Iles  . Atrial fibrillation     Dr Percival Spanish  . Fracture 1995, 2009    RUE; LUE Dr. Durward Fortes  . 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 Cystitis   . Complication of anesthesia     difficulty with arousal post op  . Hyperlipidemia   . Hyperlipidemia   . Osteoporosis   . Cerebrovascular accident 01/09  . Hypothyroidism     Past Surgical History  Procedure Laterality Date  . Tonsillectomy and adenoidectomy    . Vocal cord polyps    . Thyroid needle biopsy  2002  . Appendectomy    . Cataract extraction      Bilat  . Colonoscopy w/ polypectomy  2005    Diverticulosis;Dr. Sharlett Iles  . Total hip arthroplasty  2008    CHF & RAF post op   . Total shoulder replacement  2010    Dr Durward Fortes - left shoulder  . Mohs surgery  2013    nasal Basal Cell; Dr Sarajane Jews  . Excision metacarpal mass Right 08/17/2013    Procedure: RIGHT LONG EXCISION MASS AND DIP JOINT DEBRIDEMENT;  Surgeon: Tennis Must, MD;  Location: [facility];  Service: Orthopedics;  Laterality: Right;  . Femoral hernia repair Right  08/17/2014    Procedure: OPEN REPAIR RIGHT FEMORAL HERNIA  WITH INSERTION OF MESH;  Surgeon: Adin Hector, MD;  Location: [facility];  Service: General;  Laterality: Right;  . Insertion of mesh  08/17/2014    Procedure: INSERTION OF MESH;  Surgeon: Adin Hector, MD;  Location: [facility];  Service: General;;  . Hernia repair    . Inguinal hernia repair Right 11/15/2014    Procedure: LAPAROSCOPIC RIGHT INGUINAL HERNIA WITH MESH;  Surgeon: Alphonsa Overall, MD;  Location: [facility];  Service: General;  Laterality: Right;    There were no vitals filed for this visit.  Visit Diagnosis:  Abnormality of gait      Subjective Assessment - 03/03/15 1102    Symptoms Pt denied falls or changes since last visit. Pt continues to keep record of SaO2 and lowest reading was 86% with values ranging 88-91%. Pt reported she completed Modified Barium Swallow test this week.    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 No/denies                       [facility] Adult PT Treatment/Exercise - 03/03/15 1104    Ambulation/Gait   Ambulation/Gait Yes   Ambulation/Gait Assistance 5: Supervision;4: Min guard   Ambulation/Gait Assistance Details Pt ambulated over even terrain, with one foot on blue tile and one on white tile to decr. narrow BOS, while performing head turns, SaO2 on room air remained >90% during ambulation. VC's to improve narrow BOS during head turns. Pt required two seated rest breaks after amb. to ensure that SaO2 on room air remained >90%.   Ambulation Distance (Feet) --  75', 117'x2   Assistive device None   Gait Pattern Step-through pattern;Decreased stride length;Narrow base of support;Decreased trunk rotation   Ambulation Surface  Level;Indoor   Balance   Balance Assessed Yes   Dynamic Standing Balance   Dynamic Standing - Balance Support No upper extremity supported   Dynamic Standing - Level of Assistance 4: Min assist;Other (comment)  min guard   Dynamic Standing - Balance Activities Step ups   Dynamic Standing - Comments Ant. step ups with B LE x10/LE. VC's to improve ant. weight shifting and to improve eccentric cotnrol when stepping down. Pt required seated rest break as SaO2 on room air decr. to 86% after step up and quickly incr. (15 seconds) to >90%.   Exercises   Exercises Knee/Hip   Knee/Hip Exercises: Sidelying   Clams 3x10/LE in sidelying. VC's and tactile cues for technique and to keep hips forward. Pt's SaO2 at end of session was >93% with no c/o SOB.                PT Education - 03/03/15 1309    Education provided Yes   Education Details Progressed walking program and provided clamshell HEP, as pt's R LE in add/IR during step up/down.   Person(s) Educated Patient   Methods Explanation;Demonstration;Tactile cues;Verbal cues;Handout   Comprehension Verbalized understanding;Returned demonstration          PT Short Term Goals - 02/28/15 1317    PT SHORT TERM GOAL #1   Title Pt will be independent in HEP to improve functional mobility. Target date: 02/28/15.   Status Achieved   PT SHORT TERM GOAL #2   Title Pt will verbalize understanding of fall prevention strategies to decrease falls risk. Target date: 02/28/15.   Status Achieved           PT Long Term Goals - 02/07/15 1159    PT LONG TERM GOAL #1   Title Pt will verbalize plans to start Silver Sneakers program again upon discharge to maintain gains made in PT. Target date: 03/28/15.   Status On-going   PT LONG TERM GOAL #2   Title Pt will improve BERG balance score to 51/56 to decrease falls risk. Target date: 03/28/15.   Status On-going   PT LONG TERM GOAL #3   Title Pt will improve gait speed to >/=3.69ft/sec. to improve safety  of ambulation in the community. Target date: 03/28/15.   Status On-going   PT LONG TERM GOAL #4   Title Pt will ambulate 600' over even/uneven terrain, independently, to improve functional mobility and to work in garden at home. Target date: 03/28/15.   Status On-going   PT LONG TERM GOAL #5   Title Pt will improve ABC score by 10% to improve quality of life. Target date: 03/28/15.  Status On-going               Plan - 03/03/15 1310    Clinical Impression Statement Pt demonstrated progress, as she was able to progress amb. time to 70 seconds from 60 seconds and SaO2 on room air remained >90%. Pt requires frequent rest breaks to ensure SaO2 remains >90% during session. Pt continues to experience LOB and narrow BOS during balance activities and amb due to weakness and impaired balance. Continue with POC.   Pt will benefit from skilled therapeutic intervention in order to improve on the following deficits Abnormal gait;Decreased endurance;Decreased balance;Decreased mobility;Decreased strength;Decreased knowledge of use of DME   Rehab Potential Good   PT Frequency 2x / week   PT Duration 8 weeks   PT Treatment/Interventions ADLs/Self Care Home Management;Gait training;Neuromuscular re-education;Stair training;Biofeedback;Functional mobility training;Patient/family education;Therapeutic activities;DME Instruction;Balance training;Therapeutic exercise;Manual techniques   PT Next Visit Plan G-CODE. Continue to progress dynamic gait and balance while monitoring SaO2.   Consulted and Agree with Plan of Care Patient        Problem List Patient Active Problem List   Diagnosis Date Noted  . Recurrent inguinal hernia with obstruction 11/11/2014  . Depression 09/22/2014  . Hypoxemia 09/22/2014  . Aortic stenosis, mild 08/15/2014  . Chronic anticoagulation 08/15/2014  . Recurrent femoral hernia with obstruction without gangrene 08/15/2014  . History of CVA (cerebrovascular accident) 08/15/2014   . Squamous cell carcinoma of neck 03/09/2014  . Cough 09/16/2012  . Diverticulosis 09/11/2011  . Glaucoma 09/11/2011  . CAROTID STENOSIS 08/08/2009  . HEARING LOSS 11/22/2008  . COPD, Gold grade A 04/08/2008  . HERNIA 04/08/2008  . ARTHRITIS 04/08/2008  . VITAMIN D DEFICIENCY 02/01/2008  . Atrial fibrillation 02/01/2008  . HYPERTENSION, ESSENTIAL NOS 12/01/2007  . OSTEOPOROSIS 12/01/2007  . Hypothyroidism 08/06/2007  . DISEQUILIBRIUM 07/24/2007  . G E R D 06/02/2007  . THYROID NODULE, LEFT 04/10/2007  . HYPERLIPIDEMIA 04/10/2007  . COLONIC POLYPS, HX OF 04/10/2007    Eddye Broxterman L 03/03/2015, 1:13 PM  [facility] [address] [place], [place], [address] Phone: [phone]   Fax:  [phone]     Geoffry Paradise, PT,DPT 03/03/2015 1:13 PM Phone: [phone] Fax: [phone]

## 2015-03-07 ENCOUNTER — Ambulatory Visit: Payer: Medicare Other | Admitting: Pulmonary Disease

## 2015-03-07 ENCOUNTER — Ambulatory Visit (INDEPENDENT_AMBULATORY_CARE_PROVIDER_SITE_OTHER): Payer: Medicare Other | Admitting: Pulmonary Disease

## 2015-03-07 ENCOUNTER — Encounter: Payer: Self-pay | Admitting: Pulmonary Disease

## 2015-03-07 VITALS — BP 124/66 | HR 63 | Ht 67.5 in | Wt 127.0 lb

## 2015-03-07 DIAGNOSIS — R1310 Dysphagia, unspecified: Secondary | ICD-10-CM

## 2015-03-07 DIAGNOSIS — R0902 Hypoxemia: Secondary | ICD-10-CM

## 2015-03-07 DIAGNOSIS — J438 Other emphysema: Secondary | ICD-10-CM

## 2015-03-07 DIAGNOSIS — R1314 Dysphagia, pharyngoesophageal phase: Secondary | ICD-10-CM

## 2015-03-07 NOTE — Progress Notes (Signed)
Subjective:    Patient ID: Kristine Simmons, female    DOB: 04-29-1927, 81 y.o.   MRN: [id number]  Synopsis: GOLD GRADE A COPD January 2016 pulmonary function testing> ratio 58% FEV1 1.83L (88% pred, no change with BD), TLC 5.78L (103% pred), DLCO 9.01 (31% pred)  HPI Chief Complaint  Patient presents with  . Follow-up    rov requested by Dr. Linna Darner for [place].  Pt is wanting to come off her nocturnal 02.     Mrs. Jorge came back to see [place] in clinic today I believe because she had a recent modified barium swallow test which was abnormal. She tells me that her breathing has been about the same since the last visit. She continues to have a cough on occasion when she swallows. She is frustrated by having to use oxygen at night though she continues to use it. She has been exercising with physical therapy on a regular basis and she says she has been getting stronger. She has provided me with a long list of O2 saturation measurements collected with exercise.  Past Medical History  Diagnosis Date  . Hx of colonic polyps     last colonoscopy 2005, repeat was due 2010 Dr. Sharlett Iles  . Atrial fibrillation     Dr Percival Spanish  . Fracture 1995, 2009    RUE; LUE Dr. Durward Fortes  . 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 Cystitis   . Complication of anesthesia     difficulty with arousal post op  . Hyperlipidemia   . Hyperlipidemia   . Osteoporosis   . Cerebrovascular accident 01/09  . Hypothyroidism       Review of Systems  Constitutional: Negative for fever, chills and fatigue.  HENT: Negative for rhinorrhea, sinus pressure and sneezing.   Respiratory: Positive for cough and shortness of breath. Negative for wheezing.   Cardiovascular: Negative for chest pain, palpitations and leg swelling.       Objective:   Physical Exam Filed Vitals:   03/07/15 1159  BP: 124/66  Pulse: 63  Height: 5' 7.5" (1.715 m)  Weight: 127 lb (57.607 kg)  SpO2: 92%  RA  Gen: well  appearing, no acute distress HEENT: NCAT, EOMi, OP clear,  PULM: CTA B CV: RRR, systolic murmur, JVD noted AB: BS+, soft, nontender,  Ext: warm, no edema, no clubbing, no cyanosis Derm: no rash or skin breakdown Neuro: A&Ox4, MAEW  11/2014 CXR> atectasis and effusions bases (improved), emphysema     Assessment & Plan:   Hypoxemia This is a problem for her primarily at night.  She ambulated in our office and her O2 saturation remained above 88%. In fact, she shows me numerous values of her O2 sat duration as measured with physical therapy and only one was measured at 87%, all the rest were above 88%. It should be noted that on her overnight oximetry test on room air in January her O2 saturation was less than 88% for over 6 hours. I believe that this is due to scarring in her lungs from chronic aspiration as well as some emphysema.  Plan: -I discussed with her at length today the importance of continuing to use oxygen at night, I explained that she will  need this for life.   COPD, Gold grade A This is mild. She has not had a recent exacerbation. She should continue using Combivent as needed.   Dysphagia, pharyngoesophageal phase She had some mild pharyngeal stage dysphagia  seen on a recent modified barium swallow. She has known esophageal dysmotility with a hiatal hernia.  I believe that her dysphagia has led to chronic scarring in the bases of her lungs as seen on CT scans of the abdomen in the past. This is likely contributing to her nocturnal hypoxemia.  Plan: -Continue swallowing treatment and swallowing exercises via speech therapy     Updated Medication List Outpatient Encounter Prescriptions as of 03/07/2015  Medication Sig  . acetaminophen (TYLENOL) 325 MG tablet You can take 2 tablets every 4 hours as needed.  You cannot take more than 4000 mg of tylenol per day.  This is also in your sleep aide, and it is in your prescribed pain medicine. So you need to figure out.  Do  not take more than 12 tablets of tylenol (acetaminophen) per day.  Marland Kitchen apixaban (ELIQUIS) 2.5 MG TABS tablet Take 1 tablet (2.5 mg total) by mouth 2 (two) times daily.  . B Complex Vitamins (VITAMIN B COMPLEX) TABS Take 1 tablet by mouth daily.   . bimatoprost (LUMIGAN) 0.03 % ophthalmic solution Place 1 drop into both eyes at bedtime.   . calcium carbonate (TUMS - DOSED IN MG ELEMENTAL CALCIUM) 500 MG chewable tablet Chew 2 tablets by mouth daily as needed for indigestion or heartburn (indigestion).  . Calcium Carbonate-Vitamin D (CALCIUM-VITAMIN D) 500-200 MG-UNIT per tablet Take 1 tablet by mouth daily.   . Cholecalciferol (VITAMIN D) 2000 UNITS tablet Take 2,000 Units by mouth daily.  . Cranberry (CRANBERRY CONCENTRATE) 500 MG CAPS Take 2 capsules by mouth daily.    Marland Kitchen DIGOX 125 MCG tablet TAKE 1 TABLET ONCE DAILY.  Marland Kitchen diltiazem (CARDIZEM CD) 120 MG 24 hr capsule TAKE (1) CAPSULE DAILY.  Marland Kitchen ENSURE PLUS (ENSURE PLUS) LIQD Take 1 Can by mouth daily.  Marland Kitchen estradiol (ESTRACE) 0.1 MG/GM vaginal cream Place 2 g vaginally. Twice weekly  . levothyroxine (SYNTHROID, LEVOTHROID) 50 MCG tablet TAKE 1 TABLET ONCE DAILY.  Marland Kitchen LORazepam (ATIVAN) 1 MG tablet TAKE 1 TABLET EVERY 8-12 HOURS AS NEEDED. CAN BE HABIT FORMING & MAY AFFECT MENTAL ALERTNESS.  . Magnesium Oxide 500 MG TABS Take 1 tablet by mouth at bedtime.   . Probiotic Product (MISC INTESTINAL FLORA REGULAT) CAPS Take 1 capsule by mouth daily.    . sertraline (ZOLOFT) 50 MG tablet Take 1 tablet (50 mg total) by mouth daily.  . timolol (TIMOPTIC) 0.5 % ophthalmic solution Place 1 drop into both eyes 2 (two) times daily.   Marland Kitchen trimethoprim (TRIMPEX) 100 MG tablet Take 100 mg by mouth every other day.  Marland Kitchen VYTORIN 10-40 MG per tablet TAKE 1 TABLET ONCE DAILY.

## 2015-03-07 NOTE — Assessment & Plan Note (Signed)
She had some mild pharyngeal stage dysphagia seen on a recent modified barium swallow. She has known esophageal dysmotility with a hiatal hernia.  I believe that her dysphagia has led to chronic scarring in the bases of her lungs as seen on CT scans of the abdomen in the past. This is likely contributing to her nocturnal hypoxemia.  Plan: -Continue swallowing treatment and swallowing exercises via speech therapy

## 2015-03-07 NOTE — Assessment & Plan Note (Addendum)
This is a problem for her primarily at night.  She ambulated in our office and her O2 saturation remained above 88%. In fact, she shows me numerous values of her O2 sat duration as measured with physical therapy and only one was measured at 87%, all the rest were above 88%. It should be noted that on her overnight oximetry test on room air in January her O2 saturation was less than 88% for over 6 hours. I believe that this is due to scarring in her lungs from chronic aspiration as well as some emphysema.  Plan: -I discussed with her at length today the importance of continuing to use oxygen at night, I explained that she will  need this for life.

## 2015-03-07 NOTE — Patient Instructions (Signed)
We will request a speech therapy treatment sessions for your trouble swallowing Continue using oxygen at night Keep your previously scheduled follow-up appointment

## 2015-03-07 NOTE — Assessment & Plan Note (Signed)
This is mild. She has not had a recent exacerbation. She should continue using Combivent as needed.

## 2015-03-14 ENCOUNTER — Ambulatory Visit: Payer: Medicare Other | Admitting: Physical Therapy

## 2015-03-14 ENCOUNTER — Encounter: Payer: Self-pay | Admitting: Physical Therapy

## 2015-03-14 ENCOUNTER — Ambulatory Visit: Payer: Medicare Other | Admitting: Speech Pathology

## 2015-03-14 DIAGNOSIS — R269 Unspecified abnormalities of gait and mobility: Secondary | ICD-10-CM

## 2015-03-14 DIAGNOSIS — R1312 Dysphagia, oropharyngeal phase: Secondary | ICD-10-CM

## 2015-03-14 NOTE — Therapy (Signed)
[facility] [address] [place], [place], [address] Phone: [phone]   Fax:  [phone]  Speech Language Pathology Evaluation  Patient Details  Name: Kristine Simmons MRN: [id number] Date of Birth: 09-21-1927 Referring Provider:  Su Monks MD  Encounter Date: 03/14/2015     End of Session - 03/14/15 1138    Activity Tolerance Patient tolerated treatment well      Past Medical History  Diagnosis Date  . Hx of colonic polyps     last colonoscopy 2005, repeat was due 2010 Dr. Sharlett Iles  . Atrial fibrillation     Dr Percival Spanish  . Fracture 1995, 2009    RUE; LUE Dr. Durward Fortes  . 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 Cystitis   . Complication of anesthesia     difficulty with arousal post op  . Hyperlipidemia   . Hyperlipidemia   . Osteoporosis   . Cerebrovascular accident 01/09  . Hypothyroidism     Past Surgical History  Procedure Laterality Date  . Tonsillectomy and adenoidectomy    . Vocal cord polyps    . Thyroid needle biopsy  2002  . Appendectomy    . Cataract extraction      Bilat  . Colonoscopy w/ polypectomy  2005    Diverticulosis;Dr. Sharlett Iles  . Total hip arthroplasty  2008    CHF & RAF post op   . Total shoulder replacement  2010    Dr Durward Fortes - left shoulder  . Mohs surgery  2013    nasal Basal Cell; Dr Sarajane Jews  . Excision metacarpal mass Right 08/17/2013    Procedure: RIGHT LONG EXCISION MASS AND DIP JOINT DEBRIDEMENT;  Surgeon: Tennis Must, MD;  Location: [facility];  Service: Orthopedics;  Laterality: Right;  . Femoral hernia repair Right 08/17/2014    Procedure: OPEN REPAIR RIGHT FEMORAL HERNIA  WITH INSERTION OF MESH;  Surgeon: Adin Hector, MD;  Location: [facility];  Service: General;  Laterality: Right;  . Insertion of mesh  08/17/2014    Procedure: INSERTION OF MESH;  Surgeon: Adin Hector, MD;  Location: [facility];  Service:  General;;  . Hernia repair    . Inguinal hernia repair Right 11/15/2014    Procedure: LAPAROSCOPIC RIGHT INGUINAL HERNIA WITH MESH;  Surgeon: Alphonsa Overall, MD;  Location: [facility];  Service: General;  Laterality: Right;    There were no vitals filed for this visit.  Visit Diagnosis: Dysphagia, oropharyngeal phase      Subjective Assessment - 03/14/15 1031    Pain Onset Today            SLP Evaluation [facility] - 03/14/15 1031    SLP Visit Information   SLP Received On 03/14/15   Onset Date 03/01/2015   Medical Diagnosis Oropharyngeal dysphagia, Zenker's diverticulum   Oral Motor/Sensory Function   Overall Oral Motor/Sensory Function Appears within functional limits for tasks assessed   Motor Speech   Overall Motor Speech Appears within functional limits for tasks assessed           ADULT SLP TREATMENT - 03/14/15 1031    Cognitive-Linquistic Treatment   Treatment focused on Dysarthria   Skilled Treatment Reviewed results of swallow study - watched video on line of Zenker's diverticulum. Pt required usual mod A to verbalize swallow precautions from MBSS. Initiated training on dysphagia HEP. Pt completed exercises with occassional min a, with the exception of the Mendelson exercise.  She required consistent max A  to complete Mendelson 1x.            SLP Education - 2015/03/25 1056    Education provided Yes   Education Details swallow compensations, dysphagia HEP   Person(s) Educated Patient   Methods Explanation;Demonstration   Comprehension Verbalized understanding          SLP Short Term Goals - 03/25/2015 1135    SLP SHORT TERM GOAL #3   Title Pt will perform Mendelson manuever with usual mod A   Time 4   Period Weeks          SLP Long Term Goals - 03-25-2015 1133    SLP LONG TERM GOAL #1   Title Pt will perform dysphagia HEP with mod I (exculding Mendelson)   Time 8   Period Weeks   Status New   SLP LONG TERM GOAL #2   Title Pt will verbalize s/s of  aspiration and aspiration pna with rare min A   SLP LONG TERM GOAL #3   Title Pt will perform Mendelson exercise with occassional min A   Time 8   Period Weeks          Plan - 03-25-15 1100    Clinical Impression Statement Pt presents with mild oropharyngeal dysphagia per MBSS with Zenker's diverticulum, mininmal laryngeal penetration of thin liquids with straw and pharyngel residue. Pt reports food & pills sticking in her throat and that she frequently "gets choked."  Pt required mod cues to recall swallowing recommendations for meals and pills. Ms. Stuber denies recent or recurrent pna. I recommend skilled ST to maximze safety of swallow due to Zenker's diverticulum.    Speech Therapy Frequency 2x / week   Duration --  8 weeks (may be less pending pt progress)   Treatment/Interventions Aspiration precaution training;Pharyngeal strengthening exercises;Diet toleration management by SLP;Compensatory techniques;Patient/family education   Potential to Achieve Goals Good   Potential Considerations Cooperation/participation level  Pt will only come on days she has PT/OT   SLP Home Exercise Plan dysphagia HEP   Consulted and Agree with Plan of Care Patient;Family member/caregiver          G-Codes - Mar 25, 2015 1139    Functional Assessment Tool Used NOMS   Functional Limitations Swallowing   Swallow Current Status [phone] At least 20 percent but less than 40 percent impaired, limited or restricted   Swallow Goal Status (O7096) At least 1 percent but less than 20 percent impaired, limited or restricted      Problem List Patient Active Problem List   Diagnosis Date Noted  . Dysphagia, pharyngoesophageal phase 03/07/2015  . Recurrent inguinal hernia with obstruction 11/11/2014  . Depression 09/22/2014  . Hypoxemia 09/22/2014  . Aortic stenosis, mild 08/15/2014  . Chronic anticoagulation 08/15/2014  . Recurrent femoral hernia with obstruction without gangrene 08/15/2014  . History of  CVA (cerebrovascular accident) 08/15/2014  . Squamous cell carcinoma of neck 03/09/2014  . Cough 09/16/2012  . Diverticulosis 09/11/2011  . Glaucoma 09/11/2011  . CAROTID STENOSIS 08/08/2009  . HEARING LOSS 11/22/2008  . COPD, Gold grade A 04/08/2008  . HERNIA 04/08/2008  . ARTHRITIS 04/08/2008  . VITAMIN D DEFICIENCY 02/01/2008  . Atrial fibrillation 02/01/2008  . HYPERTENSION, ESSENTIAL NOS 12/01/2007  . OSTEOPOROSIS 12/01/2007  . Hypothyroidism 08/06/2007  . DISEQUILIBRIUM 07/24/2007  . G E R D 06/02/2007  . THYROID NODULE, LEFT 04/10/2007  . HYPERLIPIDEMIA 04/10/2007  . COLONIC POLYPS, HX OF 04/10/2007    Lovvorn, Mickel Baas  Lelon Frohlich, SLP 03/14/2015, 11:40 AM  [facility] [address] [place], [place], [address] Phone: [phone]   Fax:  [phone]
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
## 2015-03-15 ENCOUNTER — Ambulatory Visit: Payer: Medicare Other

## 2015-03-15 ENCOUNTER — Other Ambulatory Visit: Payer: Self-pay | Admitting: Internal Medicine

## 2015-03-15 DIAGNOSIS — R269 Unspecified abnormalities of gait and mobility: Secondary | ICD-10-CM

## 2015-03-15 NOTE — Therapy (Signed)
[facility] [address] [place], [place], [address] Phone: [phone]   Fax:  [phone]  Physical Therapy Treatment  Patient Details  Name: Kristine Simmons MRN: [id number] Date of Birth: [birth year] Referring Provider:  Hendricks Limes, MD  Encounter Date: 03/15/2015      PT End of Session - 03/15/15 1141    Visit Number 11   Number of Visits 17   Date for PT Re-Evaluation 04/01/15   Authorization Type G-code every 10th visit.   PT Start Time 1102   PT Stop Time 1143   PT Time Calculation (min) 41 min   Equipment Utilized During Treatment Gait belt   Activity Tolerance Patient tolerated treatment well   Behavior During Therapy WFL for tasks assessed/performed      Past Medical History  Diagnosis Date  . Hx of colonic polyps     last colonoscopy 2005, repeat was due 2010 Dr. Sharlett Iles  . Atrial fibrillation     Dr Percival Spanish  . Fracture 1995, 2009    RUE; LUE Dr. Durward Fortes  . 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 Cystitis   . Complication of anesthesia     difficulty with arousal post op  . Hyperlipidemia   . Hyperlipidemia   . Osteoporosis   . Cerebrovascular accident 01/09  . Hypothyroidism     Past Surgical History  Procedure Laterality Date  . Tonsillectomy and adenoidectomy    . Vocal cord polyps    . Thyroid needle biopsy  2002  . Appendectomy    . Cataract extraction      Bilat  . Colonoscopy w/ polypectomy  2005    Diverticulosis;Dr. Sharlett Iles  . Total hip arthroplasty  2008    CHF & RAF post op   . Total shoulder replacement  2010    Dr Durward Fortes - left shoulder  . Mohs surgery  2013    nasal Basal Cell; Dr Sarajane Jews  . Excision metacarpal mass Right 08/17/2013    Procedure: RIGHT LONG EXCISION MASS AND DIP JOINT DEBRIDEMENT;  Surgeon: Tennis Must, MD;  Location: [facility];  Service: Orthopedics;  Laterality: Right;  . Femoral hernia repair  Right 08/17/2014    Procedure: OPEN REPAIR RIGHT FEMORAL HERNIA  WITH INSERTION OF MESH;  Surgeon: Adin Hector, MD;  Location: [facility];  Service: General;  Laterality: Right;  . Insertion of mesh  08/17/2014    Procedure: INSERTION OF MESH;  Surgeon: Adin Hector, MD;  Location: [facility];  Service: General;;  . Hernia repair    . Inguinal hernia repair Right 11/15/2014    Procedure: LAPAROSCOPIC RIGHT INGUINAL HERNIA WITH MESH;  Surgeon: Alphonsa Overall, MD;  Location: [facility];  Service: General;  Laterality: Right;    Filed Vitals:   03/15/15 1116 03/15/15 1121 03/15/15 1129  SpO2: 93% 87% 87%  Pt's SaO2 on room air at rest, and after 230' of amb. Pt required seated rest breaks and pursed lip breathing to increase SaO2 to >90% after amb.  Visit Diagnosis:  Abnormality of gait      Subjective Assessment - 03/15/15 1104    Symptoms Pt denied falls since last visit. Pt reported she is still not seeing as clear today, but it is better than yesterday. Pt discussed ceasing PT early, due to starting speech therapy and she stated she can't make all appointments due to time constraints and fatigue.  [facility] Adult PT Treatment/Exercise - 03/15/15 1123    Ambulation/Gait   Ambulation/Gait Yes   Ambulation/Gait Assistance 5: Supervision   Ambulation/Gait Assistance Details Pt ambulated over even terrain while performing head turns, with and without one foot on blue tile and one foot on white tile to decr. narrow BOS. Pt also scanned walls for cards and missed 7 playing cards. VC's to improve narrow BOS while performing head turns.  See vitals section for SaO2 on room air details.   Ambulation Distance (Feet) --  75', 230'x2   Assistive device None   Gait Pattern Step-through pattern;Decreased stride length;Narrow base of support;Decreased trunk rotation   Exercises   Exercises Knee/Hip   Knee/Hip Exercises: Aerobic   Stationary Bike SciFit: B LEs only to  improve strength and endurance, with cues to keep RPMs >70, level 1.0. Pt's SaO2 on room air 90% at rest, 93% at 4:30 seconds, and 91% at 8 minutes. Distance: 0.46 miles. No c/o SOB while on SciFit.                PT Education - 03/15/15 1136    Education provided Yes   Education Details PT educated pt on the importance of continuing to perform HEP and walking program when discharged from PT. PT also educated pt on practicing breathing through her nose vs. her mouth even when she's not performing pursed lip breathing, as she has tendency to breath in/out of mouth only.   Person(s) Educated Patient   Methods Explanation   Comprehension Verbalized understanding          PT Short Term Goals - 02/28/15 1317    PT SHORT TERM GOAL #1   Title Pt will be independent in HEP to improve functional mobility. Target date: 02/28/15.   Status Achieved   PT SHORT TERM GOAL #2   Title Pt will verbalize understanding of fall prevention strategies to decrease falls risk. Target date: 02/28/15.   Status Achieved           PT Long Term Goals - 02/07/15 1159    PT LONG TERM GOAL #1   Title Pt will verbalize plans to start Silver Sneakers program again upon discharge to maintain gains made in PT. Target date: 03/28/15.   Status On-going   PT LONG TERM GOAL #2   Title Pt will improve BERG balance score to 51/56 to decrease falls risk. Target date: 03/28/15.   Status On-going   PT LONG TERM GOAL #3   Title Pt will improve gait speed to >/=3.49ft/sec. to improve safety of ambulation in the community. Target date: 03/28/15.   Status On-going   PT LONG TERM GOAL #4   Title Pt will ambulate 600' over even/uneven terrain, independently, to improve functional mobility and to work in garden at home. Target date: 03/28/15.   Status On-going   PT LONG TERM GOAL #5   Title Pt will improve ABC score by 10% to improve quality of life. Target date: 03/28/15.   Status On-going               Plan - 03/15/15  1141    Clinical Impression Statement Pt continues to be limited by decreased SaO2 during ambulation, as she required frequent seated rest breaks and pursed lip breathing to incr. SaO2 to >90%. However, pt's SaO2 on room air remained >90% while pedaling on SciFit. PT encouraged pt to continue assess SaO2 at home and to try bike at gym, and to start at 8 minutes and  to increase the time by 1 minute to improve endurance. PT will d/c pt next visit, per pt's request. Continue with POC.   Pt will benefit from skilled therapeutic intervention in order to improve on the following deficits Abnormal gait;Decreased endurance;Decreased balance;Decreased mobility;Decreased strength;Decreased knowledge of use of DME   Rehab Potential Good   PT Frequency 2x / week   PT Duration 8 weeks   PT Treatment/Interventions ADLs/Self Care Home Management;Gait training;Neuromuscular re-education;Stair training;Biofeedback;Functional mobility training;Patient/family education;Therapeutic activities;DME Instruction;Balance training;Therapeutic exercise;Manual techniques   PT Next Visit Plan Check LTGs and d/c pt, per pt request.      Problem List Patient Active Problem List   Diagnosis Date Noted  . Dysphagia, pharyngoesophageal phase 03/07/2015  . Recurrent inguinal hernia with obstruction 11/11/2014  . Depression 09/22/2014  . Hypoxemia 09/22/2014  . Aortic stenosis, mild 08/15/2014  . Chronic anticoagulation 08/15/2014  . Recurrent femoral hernia with obstruction without gangrene 08/15/2014  . History of CVA (cerebrovascular accident) 08/15/2014  . Squamous cell carcinoma of neck 03/09/2014  . Cough 09/16/2012  . Diverticulosis 09/11/2011  . Glaucoma 09/11/2011  . CAROTID STENOSIS 08/08/2009  . HEARING LOSS 11/22/2008  . COPD, Gold grade A 04/08/2008  . HERNIA 04/08/2008  . ARTHRITIS 04/08/2008  . VITAMIN D DEFICIENCY 02/01/2008  . Atrial fibrillation 02/01/2008  . HYPERTENSION, ESSENTIAL NOS 12/01/2007  .  OSTEOPOROSIS 12/01/2007  . Hypothyroidism 08/06/2007  . DISEQUILIBRIUM 07/24/2007  . G E R D 06/02/2007  . THYROID NODULE, LEFT 04/10/2007  . HYPERLIPIDEMIA 04/10/2007  . COLONIC POLYPS, HX OF 04/10/2007    Johann Gascoigne L 03/15/2015, 1:24 PM  [facility] [address] [place], [place], [address] Phone: [phone]   Fax:  [phone]     Geoffry Paradise, PT,DPT 03/15/2015 1:24 PM Phone: [phone] Fax: [phone]

## 2015-03-16 NOTE — Telephone Encounter (Signed)
OK X1 

## 2015-03-22 ENCOUNTER — Other Ambulatory Visit: Payer: Self-pay | Admitting: Cardiology

## 2015-03-22 ENCOUNTER — Ambulatory Visit: Payer: Medicare Other

## 2015-03-22 DIAGNOSIS — R269 Unspecified abnormalities of gait and mobility: Secondary | ICD-10-CM

## 2015-03-22 NOTE — Therapy (Signed)
[facility] [address] [place], [place], [address] Phone: [phone]   Fax:  [phone]  Physical Therapy Treatment  Patient Details  Name: Kristine Simmons MRN: [id number] Date of Birth: Nov 02, 1927 Referring Provider:  Hendricks Limes, MD  Encounter Date: 03/22/2015      PT End of Session - 03/22/15 1355    Visit Number 12   Number of Visits 17   Date for PT Re-Evaluation 04/01/15   Authorization Type G-code every 10th visit.   PT Start Time 1103   PT Stop Time 1145   PT Time Calculation (min) 42 min   Equipment Utilized During Treatment Gait belt   Activity Tolerance Patient tolerated treatment well   Behavior During Therapy WFL for tasks assessed/performed      Past Medical History  Diagnosis Date  . Hx of colonic polyps     last colonoscopy 2005, repeat was due 2010 Dr. Sharlett Iles  . Atrial fibrillation     Dr Percival Spanish  . Fracture 1995, 2009    RUE; LUE Dr. Durward Fortes  . 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 Cystitis   . Complication of anesthesia     difficulty with arousal post op  . Hyperlipidemia   . Hyperlipidemia   . Osteoporosis   . Cerebrovascular accident 01/09  . Hypothyroidism     Past Surgical History  Procedure Laterality Date  . Tonsillectomy and adenoidectomy    . Vocal cord polyps    . Thyroid needle biopsy  2002  . Appendectomy    . Cataract extraction      Bilat  . Colonoscopy w/ polypectomy  2005    Diverticulosis;Dr. Sharlett Iles  . Total hip arthroplasty  2008    CHF & RAF post op   . Total shoulder replacement  2010    Dr Durward Fortes - left shoulder  . Mohs surgery  2013    nasal Basal Cell; Dr Sarajane Jews  . Excision metacarpal mass Right 08/17/2013    Procedure: RIGHT LONG EXCISION MASS AND DIP JOINT DEBRIDEMENT;  Surgeon: Tennis Must, MD;  Location: [facility];  Service: Orthopedics;  Laterality: Right;  . Femoral hernia repair  Right 08/17/2014    Procedure: OPEN REPAIR RIGHT FEMORAL HERNIA  WITH INSERTION OF MESH;  Surgeon: Adin Hector, MD;  Location: [facility];  Service: General;  Laterality: Right;  . Insertion of mesh  08/17/2014    Procedure: INSERTION OF MESH;  Surgeon: Adin Hector, MD;  Location: [facility];  Service: General;;  . Hernia repair    . Inguinal hernia repair Right 11/15/2014    Procedure: LAPAROSCOPIC RIGHT INGUINAL HERNIA WITH MESH;  Surgeon: Alphonsa Overall, MD;  Location: [facility];  Service: General;  Laterality: Right;    There were no vitals filed for this visit.  Visit Diagnosis:  Abnormality of gait      Subjective Assessment - 03/22/15 1107    Symptoms Pt reported she was very busy this weekend and did not perform HEP often. Pt denied falls since last visit.    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 No/denies  [facility] Adult PT Treatment/Exercise - 03/22/15 1115    Ambulation/Gait   Ambulation/Gait Yes   Ambulation/Gait Assistance 7: Independent   Ambulation/Gait Assistance Details Pt ambulated over even/uneven terrain with one LOB, which she self corrected with stepping strategy and hand on wall. Pt demonstrated safe technique.   Ambulation Distance (Feet) 700 Feet   Assistive device None   Gait Pattern Step-through pattern   Ambulation Surface Level;Unlevel;Indoor;Outdoor;Paved   Standardized Balance Assessment   Standardized Balance Assessment Berg Balance Test;Timed Up and Go Test   Berg Balance Test   Sit to Stand Able to stand without using hands and stabilize independently   Standing Unsupported Able to stand safely 2 minutes   Sitting with Back Unsupported but Feet Supported on Floor or  Stool Able to sit safely and securely 2 minutes   Stand to Sit Sits safely with minimal use of hands   Transfers Able to transfer safely, minor use of hands   Standing Unsupported with Eyes Closed Able to stand 10 seconds safely   Standing Ubsupported with Feet Together Able to place feet together independently and stand 1 minute safely   From Standing, Reach Forward with Outstretched Arm Can reach confidently >25 cm (10")   From Standing Position, Pick up Object from Floor Able to pick up shoe safely and easily   From Standing Position, Turn to Look Behind Over each Shoulder Looks behind from both sides and weight shifts well   Turn 360 Degrees Able to turn 360 degrees safely in 4 seconds or less   Standing Unsupported, Alternately Place Feet on Step/Stool Able to stand independently and safely and complete 8 steps in 20 seconds   Standing Unsupported, One Foot in Front Able to plae foot ahead of the other independently and hold 30 seconds   Standing on One Leg Able to lift leg independently and hold 5-10 seconds   Total Score 54   Timed Up and Go Test   TUG Normal TUG   Normal TUG (seconds) 8.32  no AD                PT Education - 03/22/15 1353    Education provided Yes   Education Details PT went through [facility] and removed exercises that pt should not perform. PT educated pt on the importance of performing HEP 3x/week after d/c from PT. PT also encouraged pt to join [employer] and pt reported she planned on re-joining after Speech therapy has concluded. PT also educated pt on the fact that she breathes through her mouth vs. nose and that she might have improved SaO2 (and decreased fatigue) if she wears a mask that covers her mouth at night while she's on SpO2.   Person(s) Educated Patient   Methods Explanation   Comprehension Verbalized understanding          PT Short Term Goals - 02/28/15 1317    PT SHORT TERM GOAL #1   Title Pt will be independent in HEP to improve  functional mobility. Target date: 02/28/15.   Status Achieved   PT SHORT TERM GOAL #2   Title Pt will verbalize understanding of fall prevention strategies to decrease falls risk. Target date: 02/28/15.   Status Achieved           PT Long Term Goals - 03/22/15 1356    PT LONG TERM GOAL #1   Title Pt will verbalize plans to start Silver Sneakers program again upon discharge to maintain gains made in PT. Target date:  03/28/15.   Status Achieved   PT LONG TERM GOAL #2   Title Pt will improve BERG balance score to 51/56 to decrease falls risk. Target date: 03/28/15.   Status Achieved   PT LONG TERM GOAL #3   Title Pt will improve gait speed to >/=3.63f/sec. to improve safety of ambulation in the community. Target date: 03/28/15.   Status Achieved   PT LONG TERM GOAL #4   Title Pt will ambulate 600' over even/uneven terrain, independently, to improve functional mobility and to work in garden at home. Target date: 03/28/15.   Status Achieved   PT LONG TERM GOAL #5   Title Pt will improve ABC score by 10% to improve quality of life. Target date: 03/28/15.   Baseline Pt completed part of survey and then realized she completed it incorrectly, so PT deferred.   Status Deferred               Plan - 0April 23, [id number]    Clinical Impression Statement Pt d/c from PT today. Please see d/c summary for details.          G-Codes - [id number]/04/[id number]    Functional Assessment Tool Used BERG 54/56, Gait velocity 3.5 ft/sec, TUG 8.32 seconds   Functional Limitation Mobility: Walking and moving around   Mobility: Walking and Moving Around Goal Status [phone] At least 1 percent but less than 20 percent impaired, limited or restricted   Mobility: Walking and Moving Around Discharge Status [phone] At least 1 percent but less than 20 percent impaired, limited or restricted      Problem List Patient Active Problem List   Diagnosis Date Noted  . Dysphagia, pharyngoesophageal phase 03/07/2015  . Recurrent  inguinal hernia with obstruction 11/11/2014  . Depression 09/22/2014  . Hypoxemia 09/22/2014  . Aortic stenosis, mild 08/15/2014  . Chronic anticoagulation 08/15/2014  . Recurrent femoral hernia with obstruction without gangrene 08/15/2014  . History of CVA (cerebrovascular accident) 08/15/2014  . Squamous cell carcinoma of neck 03/09/2014  . Cough 09/16/2012  . Diverticulosis 09/11/2011  . Glaucoma 09/11/2011  . CAROTID STENOSIS 08/08/2009  . HEARING LOSS 11/22/2008  . COPD, Gold grade A 04/08/2008  . HERNIA 04/08/2008  . ARTHRITIS 04/08/2008  . VITAMIN D DEFICIENCY 02/01/2008  . Atrial fibrillation 02/01/2008  . HYPERTENSION, ESSENTIAL NOS 12/01/2007  . OSTEOPOROSIS 12/01/2007  . Hypothyroidism 08/06/2007  . DISEQUILIBRIUM 07/24/2007  . G E R D 06/02/2007  . THYROID NODULE, LEFT 04/10/2007  . HYPERLIPIDEMIA 04/10/2007  . COLONIC POLYPS, HX OF 04/10/2007    PHYSICAL THERAPY DISCHARGE SUMMARY  Visits from Start of Care: 12  Current functional level related to goals / functional outcomes:     PT Long Term Goals - 0April 23, 20161356    PT LONG TERM GOAL #1   Title Pt will verbalize plans to start Silver Sneakers program again upon discharge to maintain gains made in PT. Target date: 03/28/15.   Status Achieved   PT LONG TERM GOAL #2   Title Pt will improve BERG balance score to 51/56 to decrease falls risk. Target date: 03/28/15.   Status Achieved   PT LONG TERM GOAL #3   Title Pt will improve gait speed to >/=3.079fsec. to improve safety of ambulation in the community. Target date: 03/28/15.   Status Achieved   PT LONG TERM GOAL #4   Title Pt will ambulate 600' over even/uneven terrain, independently, to improve functional mobility and to work in garden at home. Target date: 03/28/15.  Status Achieved   PT LONG TERM GOAL #5   Title Pt will improve ABC score by 10% to improve quality of life. Target date: 03/28/15.   Baseline Pt completed part of survey and then realized she  completed it incorrectly, so PT deferred.   Status Deferred        Remaining deficits: Occasional LOB while ambulating over uneven terrain and decreased SaO2 on room air after activity.   Education / Equipment: HEP and falls prevention handout.  Plan: Patient agrees to discharge.  Patient goals were met. Patient is being discharged due to meeting the stated rehab goals.  ?????       Miller,Jennifer L 03/22/2015, 1:58 PM  [facility] [address] [place], [place], [address] Phone: [phone]   Fax:  [phone]     Geoffry Paradise, PT,DPT 03/22/2015 1:58 PM Phone: [phone] Fax: [phone]

## 2015-03-23 ENCOUNTER — Telehealth: Payer: Self-pay | Admitting: Pulmonary Disease

## 2015-03-23 DIAGNOSIS — J438 Other emphysema: Secondary | ICD-10-CM

## 2015-03-23 NOTE — Telephone Encounter (Signed)
Per BQ's last OV, pt is only using oxygen at night. She would like for all the other equipment to be picked up. Order has been placed. Nothing further was needed.

## 2015-03-28 ENCOUNTER — Encounter: Payer: Medicare Other | Admitting: Speech Pathology

## 2015-03-30 ENCOUNTER — Ambulatory Visit: Payer: Medicare Other

## 2015-04-06 ENCOUNTER — Emergency Department (HOSPITAL_COMMUNITY): Payer: Medicare Other

## 2015-04-06 ENCOUNTER — Encounter (HOSPITAL_COMMUNITY): Payer: Self-pay | Admitting: Emergency Medicine

## 2015-04-06 ENCOUNTER — Emergency Department (HOSPITAL_COMMUNITY)
Admission: EM | Admit: 2015-04-06 | Discharge: 2015-04-06 | Disposition: A | Discharge status: Home / Self Care | Payer: Medicare Other | Source: Home / Self Care | Attending: Emergency Medicine | Admitting: Emergency Medicine

## 2015-04-06 DIAGNOSIS — I4891 Unspecified atrial fibrillation: Secondary | ICD-10-CM | POA: Insufficient documentation

## 2015-04-06 DIAGNOSIS — Z86010 Personal history of colonic polyps: Secondary | ICD-10-CM

## 2015-04-06 DIAGNOSIS — H9190 Unspecified hearing loss, unspecified ear: Secondary | ICD-10-CM | POA: Insufficient documentation

## 2015-04-06 DIAGNOSIS — Z8673 Personal history of transient ischemic attack (TIA), and cerebral infarction without residual deficits: Secondary | ICD-10-CM

## 2015-04-06 DIAGNOSIS — I509 Heart failure, unspecified: Secondary | ICD-10-CM | POA: Diagnosis present

## 2015-04-06 DIAGNOSIS — Z87891 Personal history of nicotine dependence: Secondary | ICD-10-CM

## 2015-04-06 DIAGNOSIS — Z8742 Personal history of other diseases of the female genital tract: Secondary | ICD-10-CM

## 2015-04-06 DIAGNOSIS — Z96612 Presence of left artificial shoulder joint: Secondary | ICD-10-CM | POA: Diagnosis present

## 2015-04-06 DIAGNOSIS — W01198A Fall on same level from slipping, tripping and stumbling with subsequent striking against other object, initial encounter: Secondary | ICD-10-CM

## 2015-04-06 DIAGNOSIS — Z96649 Presence of unspecified artificial hip joint: Secondary | ICD-10-CM | POA: Diagnosis present

## 2015-04-06 DIAGNOSIS — E039 Hypothyroidism, unspecified: Secondary | ICD-10-CM | POA: Diagnosis present

## 2015-04-06 DIAGNOSIS — E785 Hyperlipidemia, unspecified: Secondary | ICD-10-CM

## 2015-04-06 DIAGNOSIS — R4182 Altered mental status, unspecified: Secondary | ICD-10-CM | POA: Diagnosis not present

## 2015-04-06 DIAGNOSIS — Z66 Do not resuscitate: Secondary | ICD-10-CM | POA: Diagnosis present

## 2015-04-06 DIAGNOSIS — Y92096 Garden or yard of other non-institutional residence as the place of occurrence of the external cause: Secondary | ICD-10-CM

## 2015-04-06 DIAGNOSIS — S00532A Contusion of oral cavity, initial encounter: Secondary | ICD-10-CM | POA: Insufficient documentation

## 2015-04-06 DIAGNOSIS — J690 Pneumonitis due to inhalation of food and vomit: Principal | ICD-10-CM | POA: Diagnosis present

## 2015-04-06 DIAGNOSIS — Z9841 Cataract extraction status, right eye: Secondary | ICD-10-CM

## 2015-04-06 DIAGNOSIS — Z888 Allergy status to other drugs, medicaments and biological substances status: Secondary | ICD-10-CM

## 2015-04-06 DIAGNOSIS — Y998 Other external cause status: Secondary | ICD-10-CM

## 2015-04-06 DIAGNOSIS — Z7902 Long term (current) use of antithrombotics/antiplatelets: Secondary | ICD-10-CM

## 2015-04-06 DIAGNOSIS — Z881 Allergy status to other antibiotic agents status: Secondary | ICD-10-CM

## 2015-04-06 DIAGNOSIS — Z9842 Cataract extraction status, left eye: Secondary | ICD-10-CM

## 2015-04-06 DIAGNOSIS — W19XXXA Unspecified fall, initial encounter: Secondary | ICD-10-CM

## 2015-04-06 DIAGNOSIS — J449 Chronic obstructive pulmonary disease, unspecified: Secondary | ICD-10-CM | POA: Diagnosis present

## 2015-04-06 DIAGNOSIS — M810 Age-related osteoporosis without current pathological fracture: Secondary | ICD-10-CM | POA: Diagnosis present

## 2015-04-06 DIAGNOSIS — R1314 Dysphagia, pharyngoesophageal phase: Secondary | ICD-10-CM | POA: Diagnosis present

## 2015-04-06 DIAGNOSIS — Z79899 Other long term (current) drug therapy: Secondary | ICD-10-CM

## 2015-04-06 DIAGNOSIS — H540 Blindness, both eyes: Secondary | ICD-10-CM | POA: Diagnosis present

## 2015-04-06 DIAGNOSIS — J9621 Acute and chronic respiratory failure with hypoxia: Secondary | ICD-10-CM | POA: Diagnosis present

## 2015-04-06 DIAGNOSIS — H409 Unspecified glaucoma: Secondary | ICD-10-CM | POA: Diagnosis present

## 2015-04-06 DIAGNOSIS — S022XXA Fracture of nasal bones, initial encounter for closed fracture: Secondary | ICD-10-CM | POA: Insufficient documentation

## 2015-04-06 DIAGNOSIS — M25512 Pain in left shoulder: Secondary | ICD-10-CM | POA: Diagnosis present

## 2015-04-06 DIAGNOSIS — Y93H2 Activity, gardening and landscaping: Secondary | ICD-10-CM

## 2015-04-06 DIAGNOSIS — M8580 Other specified disorders of bone density and structure, unspecified site: Secondary | ICD-10-CM | POA: Insufficient documentation

## 2015-04-06 DIAGNOSIS — Z9049 Acquired absence of other specified parts of digestive tract: Secondary | ICD-10-CM | POA: Diagnosis present

## 2015-04-06 DIAGNOSIS — Z7901 Long term (current) use of anticoagulants: Secondary | ICD-10-CM

## 2015-04-06 LAB — BASIC METABOLIC PANEL
Anion gap: 8 (ref 5–15)
BUN: 15 mg/dL (ref 6–23)
CO2: 28 mmol/L (ref 19–32)
CREATININE: 0.97 mg/dL (ref 0.50–1.10)
Calcium: 9.1 mg/dL (ref 8.4–10.5)
Chloride: 100 mmol/L (ref 96–112)
GFR, EST AFRICAN AMERICAN: 60 mL/min — AB (ref >90)
GFR, EST NON AFRICAN AMERICAN: 51 mL/min — AB (ref >90)
Glucose, Bld: 93 mg/dL (ref 70–99)
Potassium: 4.6 mmol/L (ref 3.5–5.1)
SODIUM: 136 mmol/L (ref 135–145)

## 2015-04-06 LAB — CBC WITH DIFFERENTIAL/PLATELET
BASOS ABS: 0.1 10*3/uL (ref 0.0–0.1)
BASOS PCT: 1 % (ref 0–1)
Eosinophils Absolute: 0.1 10*3/uL (ref 0.0–0.7)
Eosinophils Relative: 1 % (ref 0–5)
HEMATOCRIT: 44.4 % (ref 36.0–46.0)
Hemoglobin: 14.4 g/dL (ref 12.0–15.0)
LYMPHS PCT: 29 % (ref 12–46)
Lymphs Abs: 1.6 10*3/uL (ref 0.7–4.0)
MCH: 29.1 pg (ref 26.0–34.0)
MCHC: 32.4 g/dL (ref 30.0–36.0)
MCV: 89.9 fL (ref 78.0–100.0)
Monocytes Absolute: 0.5 10*3/uL (ref 0.1–1.0)
Monocytes Relative: 9 % (ref 3–12)
NEUTROS ABS: 3.3 10*3/uL (ref 1.7–7.7)
Neutrophils Relative %: 60 % (ref 43–77)
PLATELETS: 175 10*3/uL (ref 150–400)
RBC: 4.94 MIL/uL (ref 3.87–5.11)
RDW: 13.9 % (ref 11.5–15.5)
WBC: 5.5 10*3/uL (ref 4.0–10.5)

## 2015-04-06 LAB — PROTIME-INR
INR: 1.23 (ref 0.00–1.49)
PROTHROMBIN TIME: 15.6 s — AB (ref 11.6–15.2)

## 2015-04-06 MED ORDER — MORPHINE SULFATE 2 MG/ML IJ SOLN
2.0000 mg | Freq: Once | INTRAMUSCULAR | Status: AC
Start: 2015-04-06 — End: 2015-04-06
  Administered 2015-04-06: 2 mg via INTRAVENOUS
  Filled 2015-04-06: qty 1

## 2015-04-06 MED ORDER — ONDANSETRON HCL 4 MG/2ML IJ SOLN
4.0000 mg | Freq: Once | INTRAMUSCULAR | Status: AC
Start: 2015-04-06 — End: 2015-04-06
  Administered 2015-04-06: 4 mg via INTRAVENOUS
  Filled 2015-04-06: qty 2

## 2015-04-06 MED ORDER — OXYCODONE-ACETAMINOPHEN 5-325 MG PO TABS: ORAL_TABLET | ORAL | Status: DC

## 2015-04-06 NOTE — Discharge Instructions (Signed)
Nasal Fracture A fracture is a break in the bone. A nasal fracture is a broken nose. Minor breaks do not need treatment. Serious breaks may need surgery.  HOME CARE  Put ice on the injured area.  Put ice in a plastic bag.  Place a towel between your skin and the bag.  Leave the ice on for 15-20 minutes, 03-04 times a day.  Only take medicine as told by your doctor.  If your nose bleeds, squeeze your nose shut gently. Sit upright for 10 minutes.  Do not play contact sports for 3 to 4 weeks or as told by your doctor. GET HELP RIGHT AWAY IF:   You have more pain or severe pain.  You keep having nosebleeds.  The shape of your nose does not return to normal after 5 days.  You have yellowish white fluid (pus) coming from your nose.  Your nose bleeds for over 20 minutes.  Clear fluid drains from your nose.  You have a grape-like puffiness (swelling) on the inside of your nose.  You have trouble moving your eyes.  You keep throwing up (vomiting). MAKE SURE YOU:   Understand these instructions.  Will watch this condition.  Will get help right away if you are not doing well or get worse. Document Released: 09/17/2008 Document Revised: 03/02/2012 Document Reviewed: 03/25/2011 [facility] Patient Information 2015 [place], [place]. This information is not intended to replace advice given to you by your health care provider. Make sure you discuss any questions you have with your health care provider.

## 2015-04-06 NOTE — ED Notes (Signed)
Nurse drawing labs. 

## 2015-04-06 NOTE — ED Notes (Signed)
Pt sts she was working in the yard, lost her balance and fell face forward onto cement. Pt denies dizziness before falling. Pt's nose is swollen, red, with an abrasion down the front. Pt's had nose bleed initially, bleeding controlled at this time. Pt also has hematoma on forehead. Pt is taking blood thinners. Pt denies LOC. Ambulated into ER but sts "I feel woozy." A&Ox4.

## 2015-04-06 NOTE — ED Provider Notes (Signed)
CSN: [id number]     Arrival date & time 04/06/15  1232 History   First MD Initiated Contact with Patient 04/06/15 1245     Chief Complaint  Patient presents with  . Fall  . Epistaxis  . Head Injury      HPI  She presents for evaluation of her fall. She was doing some gardening and flowering in the yard. Lost her balance and fell forward onto see med. Conversation with her hands wearing gloves but did strike her nose. Has a soft tissue swelling and ecchymosis over nasal bridge. No loss of consciousness. She does take Eliquis for permanent A. fib.  Past Medical History  Diagnosis Date  . Hx of colonic polyps     last colonoscopy 2005, repeat was due 2010 Dr. Sharlett Iles  . Atrial fibrillation     Dr Percival Spanish  . Fracture 1995, 2009    RUE; LUE Dr. Durward Fortes  . 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 Cystitis   . Complication of anesthesia     difficulty with arousal post op  . Hyperlipidemia   . Hyperlipidemia   . Osteoporosis   . Cerebrovascular accident 01/09  . Hypothyroidism    Past Surgical History  Procedure Laterality Date  . Tonsillectomy and adenoidectomy    . Vocal cord polyps    . Thyroid needle biopsy  2002  . Appendectomy    . Cataract extraction      [facility]  . Colonoscopy w/ polypectomy  2005    Diverticulosis;Dr. Sharlett Iles  . Total hip arthroplasty  2008    CHF & RAF post op   . Total shoulder replacement  2010    Dr Durward Fortes - left shoulder  . Mohs surgery  2013    nasal Basal Cell; Dr Sarajane Jews  . Excision metacarpal mass Right 08/17/2013    Procedure: RIGHT LONG EXCISION MASS AND DIP JOINT DEBRIDEMENT;  Surgeon: Tennis Must, MD;  Location: [facility];  Service: Orthopedics;  Laterality: Right;  . Femoral hernia repair Right 08/17/2014    Procedure: OPEN REPAIR RIGHT FEMORAL HERNIA  WITH INSERTION OF MESH;  Surgeon: Adin Hector, MD;  Location: [facility];  Service: General;  Laterality: Right;  . Insertion of mesh   08/17/2014    Procedure: INSERTION OF MESH;  Surgeon: Adin Hector, MD;  Location: [facility];  Service: General;;  . Hernia repair    . Inguinal hernia repair Right 11/15/2014    Procedure: LAPAROSCOPIC RIGHT INGUINAL HERNIA WITH MESH;  Surgeon: Alphonsa Overall, MD;  Location: [facility];  Service: General;  Laterality: Right;   Family History  Problem Relation Age of Onset  . Stroke Father     > 39  . Diabetes Father   . Leukemia Mother   . Cancer Mother     leukemia  . Colon cancer Brother     Valve replacement  . Heart attack Brother   . Breast cancer Sister   . Ovarian cancer Sister   . Lung cancer Sister     smoker  . Prostate cancer Brother   . Cancer Brother     bladder   History  Substance Use Topics  . Smoking status: Former Smoker -- 1.00 packs/day for 45 years    Types: Cigarettes    Quit date: 12/23/1993  . Smokeless tobacco: Never Used     Comment: Smoked 1950-1995, up to 1 ppd  . Alcohol Use: No  OB History    No data available     Review of Systems  Constitutional: Negative for fever, chills, diaphoresis, appetite change and fatigue.  HENT: Positive for facial swelling. Negative for mouth sores, sore throat and trouble swallowing.        Nasal pain. Swelling and ecchymosis.  Eyes: Negative for visual disturbance.  Respiratory: Negative for cough, chest tightness, shortness of breath and wheezing.   Cardiovascular: Negative for chest pain.  Gastrointestinal: Negative for nausea, vomiting, abdominal pain, diarrhea and abdominal distention.  Endocrine: Negative for polydipsia, polyphagia and polyuria.  Genitourinary: Negative for dysuria, frequency and hematuria.  Musculoskeletal: Negative for gait problem.  Skin: Negative for color change, pallor and rash.  Neurological: Negative for dizziness, syncope, light-headedness and headaches.  Hematological: Does not bruise/bleed easily.  Psychiatric/Behavioral: Negative for behavioral problems and confusion.       Allergies  Hydrocodone; Desmopressin acetate; Dorzolamide hcl; Levofloxacin; and Ciprofloxacin  Home Medications   Prior to Admission medications   Medication Sig Start Date End Date Taking? Authorizing Provider  apixaban (ELIQUIS) 2.5 MG TABS tablet Take 1 tablet (2.5 mg total) by mouth 2 (two) times daily. 11/11/14  Yes Minus Breeding, MD  B Complex Vitamins (VITAMIN B COMPLEX) TABS Take 1 tablet by mouth daily.    Yes Historical Provider, MD  bimatoprost (LUMIGAN) 0.03 % ophthalmic solution Place 1 drop into both eyes at bedtime.    Yes Historical Provider, MD  calcium carbonate (TUMS - DOSED IN MG ELEMENTAL CALCIUM) 500 MG chewable tablet Chew 2 tablets by mouth daily as needed for indigestion or heartburn (indigestion).   Yes Historical Provider, MD  Cholecalciferol (VITAMIN D) 2000 UNITS tablet Take 2,000 Units by mouth daily.   Yes Historical Provider, MD  Cranberry (CRANBERRY CONCENTRATE) 500 MG CAPS Take 1 capsule by mouth 2 (two) times daily.    Yes Historical Provider, MD  digoxin (LANOXIN) 0.125 MG tablet Take 0.125 mg by mouth daily.   Yes Historical Provider, MD  diltiazem (CARDIZEM CD) 120 MG 24 hr capsule TAKE (1) CAPSULE DAILY. 03/23/15  Yes Minus Breeding, MD  diphenhydramine-acetaminophen (TYLENOL PM) 25-500 MG TABS Take 2 tablets by mouth at bedtime as needed (sleep).   Yes Historical Provider, MD  ENSURE PLUS (ENSURE PLUS) LIQD Take 1 Can by mouth daily. 12/20/14  Yes Hendricks Limes, MD  estradiol (ESTRACE) 0.1 MG/GM vaginal cream Place 2 g vaginally 2 (two) times a week. On Wednesdays & Saturdays.   Yes Historical Provider, MD  levothyroxine (SYNTHROID, LEVOTHROID) 50 MCG tablet TAKE 1 TABLET ONCE DAILY. 12/07/14  Yes Hendricks Limes, MD  LORazepam (ATIVAN) 1 MG tablet TAKE 1 TABLET EVERY 8-12 HOURS AS NEEDED. CAN BE HABIT FORMING & MAY AFFECT MENTAL ALERTNESS. 11/23/14  Yes Biagio Borg, MD  Magnesium Oxide 500 MG TABS Take 1 tablet by mouth at bedtime.    Yes  Historical Provider, MD  Probiotic Product (MISC INTESTINAL FLORA REGULAT) CAPS Take 1 capsule by mouth daily.     Yes Historical Provider, MD  sertraline (ZOLOFT) 50 MG tablet TAKE 1/2 TABLET DAILY FOR FIRST 10 DAYS, THEN INCREASE TO 1 TABLET DAILY. 03/16/15  Yes Hendricks Limes, MD  timolol (TIMOPTIC) 0.5 % ophthalmic solution Place 1 drop into both eyes 2 (two) times daily.    Yes Historical Provider, MD  VYTORIN 10-40 MG per tablet TAKE 1 TABLET ONCE DAILY. 10/20/14  Yes Hendricks Limes, MD  acetaminophen (TYLENOL) 325 MG tablet You can take 2 tablets  every 4 hours as needed.  You cannot take more than 4000 mg of tylenol per day.  This is also in your sleep aide, and it is in your prescribed pain medicine. So you need to figure out.  Do not take more than 12 tablets of tylenol (acetaminophen) per day. 08/19/14   Earnstine Regal, PA-C  Calcium Carbonate-Vitamin D (CALCIUM-VITAMIN D) 500-200 MG-UNIT per tablet Take 1 tablet by mouth daily.     Historical Provider, MD  Hughes 125 MCG tablet TAKE 1 TABLET ONCE DAILY. Patient not taking: Reported on 04/06/2015 12/08/14   Minus Breeding, MD  oxyCODONE-acetaminophen (PERCOCET/ROXICET) 5-325 MG per tablet 1/2-1 tab by mouth every 4 hours when necessary for pain 04/06/15   Tanna Furry, MD  trimethoprim (TRIMPEX) 100 MG tablet Take 100 mg by mouth every other day.    Historical Provider, MD   BP 124/68 mmHg  Pulse 80  Temp(Src) 98 F (36.7 C) (Oral)  Resp 17  SpO2 93% Physical Exam  Constitutional: She is oriented to person, place, and time. She appears well-developed and well-nourished. No distress.  HENT:  Head: Normocephalic.  Tissue swelling symmetric over the nasal bridge and towards the medial canthus of both eyes. Ecchymosis to the intraoral soft tissues. No septal hematoma. No proptosis or enophthalmos. Normal V1 to V3 sensation. Normal extra ocular movements.  Eyes: Conjunctivae are normal. Pupils are equal, round, and reactive to light. No  scleral icterus.  Neck: Normal range of motion. Neck supple. No thyromegaly present.  Cardiovascular: Normal rate and regular rhythm.  Exam reveals no gallop and no friction rub.   No murmur heard. Pulmonary/Chest: Effort normal and breath sounds normal. No respiratory distress. She has no wheezes. She has no rales.  Abdominal: Soft. Bowel sounds are normal. She exhibits no distension. There is no tenderness. There is no rebound.  Musculoskeletal: Normal range of motion.  Neurological: She is alert and oriented to person, place, and time.  Skin: Skin is warm and dry. No rash noted.  Psychiatric: She has a normal mood and affect. Her behavior is normal.    ED Course  Procedures (including critical care time) Labs Review Labs Reviewed  BASIC METABOLIC PANEL - Abnormal; Notable for the following:    GFR calc non Af Amer 51 (*)    GFR calc Af Amer 60 (*)    All other components within normal limits  PROTIME-INR - Abnormal; Notable for the following:    Prothrombin Time 15.6 (*)    All other components within normal limits  CBC WITH DIFFERENTIAL/PLATELET    Imaging Review Ct Head Wo Contrast  04/06/2015   CLINICAL DATA:  Dizziness and a fall today with a blow to the face and nose. Nosebleed, facial contusion and abrasion.  EXAM: CT HEAD WITHOUT CONTRAST  CT MAXILLOFACIAL WITHOUT CONTRAST  CT CERVICAL SPINE WITHOUT CONTRAST  TECHNIQUE: Multidetector CT imaging of the head, cervical spine, and maxillofacial structures were performed using the standard protocol without intravenous contrast. Multiplanar CT image reconstructions of the cervical spine and maxillofacial structures were also generated.  COMPARISON:  Headache maxillofacial CT scans 03/12/2011.  FINDINGS: CT HEAD FINDINGS  Cortical atrophy and chronic microvascular ischemic change are again seen. Remote lacunar infarction in the left basal ganglia is unchanged in appearance. No acute abnormality including hemorrhage, infarct, mass  lesion, mass effect, midline shift or abnormal extra-axial fluid collection is identified. There is no hydrocephalus or pneumocephalus. The calvarium is intact.  CT MAXILLOFACIAL FINDINGS  The patient has a  acute bilateral nasal bone fractures with minimal impaction of the superior aspect of the right nasal bone identified. The facial bones are otherwise intact. The mandibular condyles are located with degenerative change seen. The patient is status post bilateral lens extraction. The globes are intact and orbital fat is clear. Soft tissue contusion above the nose and forehead is noted.  CT CERVICAL SPINE FINDINGS  Vertebral body height is maintained. There is mild reversal of the normal cervical lordosis. Multilevel degenerative disc disease appears worst at C5-6 and C6-7. There also has multilevel facet arthropathy. Paraspinous structures demonstrate carotid atherosclerosis. Lung apices are emphysematous but clear.  IMPRESSION: No acute abnormality cervical spine.  Bilateral nasal bone fractures with minimal depression of the superior aspect of the right base above identified. Forehead soft tissue contusion is also seen.  Atrophy and chronic microvascular ischemic change.  Cervical spondylosis.  Emphysema.   Electronically Signed   By: Inge Rise M.D.   On: 04/06/2015 14:22   Ct Cervical Spine Wo Contrast  04/06/2015   CLINICAL DATA:  Dizziness and a fall today with a blow to the face and nose. Nosebleed, facial contusion and abrasion.  EXAM: CT HEAD WITHOUT CONTRAST  CT MAXILLOFACIAL WITHOUT CONTRAST  CT CERVICAL SPINE WITHOUT CONTRAST  TECHNIQUE: Multidetector CT imaging of the head, cervical spine, and maxillofacial structures were performed using the standard protocol without intravenous contrast. Multiplanar CT image reconstructions of the cervical spine and maxillofacial structures were also generated.  COMPARISON:  Headache maxillofacial CT scans 03/12/2011.  FINDINGS: CT HEAD FINDINGS  Cortical  atrophy and chronic microvascular ischemic change are again seen. Remote lacunar infarction in the left basal ganglia is unchanged in appearance. No acute abnormality including hemorrhage, infarct, mass lesion, mass effect, midline shift or abnormal extra-axial fluid collection is identified. There is no hydrocephalus or pneumocephalus. The calvarium is intact.  CT MAXILLOFACIAL FINDINGS  The patient has a acute bilateral nasal bone fractures with minimal impaction of the superior aspect of the right nasal bone identified. The facial bones are otherwise intact. The mandibular condyles are located with degenerative change seen. The patient is status post bilateral lens extraction. The globes are intact and orbital fat is clear. Soft tissue contusion above the nose and forehead is noted.  CT CERVICAL SPINE FINDINGS  Vertebral body height is maintained. There is mild reversal of the normal cervical lordosis. Multilevel degenerative disc disease appears worst at C5-6 and C6-7. There also has multilevel facet arthropathy. Paraspinous structures demonstrate carotid atherosclerosis. Lung apices are emphysematous but clear.  IMPRESSION: No acute abnormality cervical spine.  Bilateral nasal bone fractures with minimal depression of the superior aspect of the right base above identified. Forehead soft tissue contusion is also seen.  Atrophy and chronic microvascular ischemic change.  Cervical spondylosis.  Emphysema.   Electronically Signed   By: Inge Rise M.D.   On: 04/06/2015 14:22   Ct Maxillofacial Wo Cm  04/06/2015   CLINICAL DATA:  Dizziness and a fall today with a blow to the face and nose. Nosebleed, facial contusion and abrasion.  EXAM: CT HEAD WITHOUT CONTRAST  CT MAXILLOFACIAL WITHOUT CONTRAST  CT CERVICAL SPINE WITHOUT CONTRAST  TECHNIQUE: Multidetector CT imaging of the head, cervical spine, and maxillofacial structures were performed using the standard protocol without intravenous contrast. Multiplanar  CT image reconstructions of the cervical spine and maxillofacial structures were also generated.  COMPARISON:  Headache maxillofacial CT scans 03/12/2011.  FINDINGS: CT HEAD FINDINGS  Cortical atrophy and chronic microvascular ischemic change  are again seen. Remote lacunar infarction in the left basal ganglia is unchanged in appearance. No acute abnormality including hemorrhage, infarct, mass lesion, mass effect, midline shift or abnormal extra-axial fluid collection is identified. There is no hydrocephalus or pneumocephalus. The calvarium is intact.  CT MAXILLOFACIAL FINDINGS  The patient has a acute bilateral nasal bone fractures with minimal impaction of the superior aspect of the right nasal bone identified. The facial bones are otherwise intact. The mandibular condyles are located with degenerative change seen. The patient is status post bilateral lens extraction. The globes are intact and orbital fat is clear. Soft tissue contusion above the nose and forehead is noted.  CT CERVICAL SPINE FINDINGS  Vertebral body height is maintained. There is mild reversal of the normal cervical lordosis. Multilevel degenerative disc disease appears worst at C5-6 and C6-7. There also has multilevel facet arthropathy. Paraspinous structures demonstrate carotid atherosclerosis. Lung apices are emphysematous but clear.  IMPRESSION: No acute abnormality cervical spine.  Bilateral nasal bone fractures with minimal depression of the superior aspect of the right base above identified. Forehead soft tissue contusion is also seen.  Atrophy and chronic microvascular ischemic change.  Cervical spondylosis.  Emphysema.   Electronically Signed   By: Inge Rise M.D.   On: 04/06/2015 14:22     EKG Interpretation None      MDM   Final diagnoses:  Fall  Nasal fracture, closed, initial encounter    Nondisplaced bilateral nasal bone fractures. Normal cranial CT. Tolerating the pain fairly well after some IV morphine and  Zofran. Reexam shows no evolving ptosis.  Remains with normal vision. Normal sensation to the face. No septal hematoma. Normal dentition without malocclusion. Neck she is appropriate for outpatient treatment. Discussed with the marked amount of swelling and edema and ecchymosis to expect. I've asked her to see her head elevated tonight to keep her from being completely occluded with her soft tissue swelling and ecchymosis tomorrow. Prescription for oxycodone which she is taken in the past. One half to one every 4 hours. Primary care follow-up.    Tanna Furry, MD 04/06/15 [phone]

## 2015-04-06 NOTE — ED Notes (Addendum)
Patient has returned from CT

## 2015-04-08 ENCOUNTER — Emergency Department (HOSPITAL_COMMUNITY): Payer: Medicare Other

## 2015-04-08 ENCOUNTER — Inpatient Hospital Stay (HOSPITAL_COMMUNITY)
Admission: EM | Admit: 2015-04-08 | Discharge: 2015-04-14 | DRG: 177 | Disposition: A | Discharge status: Home Health | Payer: Medicare Other | Attending: Internal Medicine | Admitting: Internal Medicine

## 2015-04-08 DIAGNOSIS — M25512 Pain in left shoulder: Secondary | ICD-10-CM | POA: Diagnosis present

## 2015-04-08 DIAGNOSIS — Z888 Allergy status to other drugs, medicaments and biological substances status: Secondary | ICD-10-CM | POA: Diagnosis not present

## 2015-04-08 DIAGNOSIS — Z9049 Acquired absence of other specified parts of digestive tract: Secondary | ICD-10-CM | POA: Diagnosis present

## 2015-04-08 DIAGNOSIS — R059 Cough, unspecified: Secondary | ICD-10-CM | POA: Diagnosis present

## 2015-04-08 DIAGNOSIS — Z7901 Long term (current) use of anticoagulants: Secondary | ICD-10-CM

## 2015-04-08 DIAGNOSIS — R0902 Hypoxemia: Secondary | ICD-10-CM | POA: Diagnosis present

## 2015-04-08 DIAGNOSIS — R4182 Altered mental status, unspecified: Secondary | ICD-10-CM | POA: Diagnosis present

## 2015-04-08 DIAGNOSIS — J690 Pneumonitis due to inhalation of food and vomit: Secondary | ICD-10-CM | POA: Diagnosis present

## 2015-04-08 DIAGNOSIS — M25519 Pain in unspecified shoulder: Secondary | ICD-10-CM

## 2015-04-08 DIAGNOSIS — Z8673 Personal history of transient ischemic attack (TIA), and cerebral infarction without residual deficits: Secondary | ICD-10-CM | POA: Diagnosis not present

## 2015-04-08 DIAGNOSIS — Z96649 Presence of unspecified artificial hip joint: Secondary | ICD-10-CM | POA: Diagnosis present

## 2015-04-08 DIAGNOSIS — E039 Hypothyroidism, unspecified: Secondary | ICD-10-CM | POA: Diagnosis present

## 2015-04-08 DIAGNOSIS — Z9841 Cataract extraction status, right eye: Secondary | ICD-10-CM | POA: Diagnosis not present

## 2015-04-08 DIAGNOSIS — I509 Heart failure, unspecified: Secondary | ICD-10-CM | POA: Diagnosis present

## 2015-04-08 DIAGNOSIS — R1314 Dysphagia, pharyngoesophageal phase: Secondary | ICD-10-CM | POA: Diagnosis present

## 2015-04-08 DIAGNOSIS — J438 Other emphysema: Secondary | ICD-10-CM | POA: Diagnosis not present

## 2015-04-08 DIAGNOSIS — Z86010 Personal history of colonic polyps: Secondary | ICD-10-CM | POA: Diagnosis not present

## 2015-04-08 DIAGNOSIS — J9621 Acute and chronic respiratory failure with hypoxia: Secondary | ICD-10-CM | POA: Diagnosis present

## 2015-04-08 DIAGNOSIS — M810 Age-related osteoporosis without current pathological fracture: Secondary | ICD-10-CM | POA: Diagnosis present

## 2015-04-08 DIAGNOSIS — H409 Unspecified glaucoma: Secondary | ICD-10-CM | POA: Diagnosis present

## 2015-04-08 DIAGNOSIS — I481 Persistent atrial fibrillation: Secondary | ICD-10-CM | POA: Diagnosis not present

## 2015-04-08 DIAGNOSIS — J189 Pneumonia, unspecified organism: Secondary | ICD-10-CM | POA: Diagnosis not present

## 2015-04-08 DIAGNOSIS — R7989 Other specified abnormal findings of blood chemistry: Secondary | ICD-10-CM

## 2015-04-08 DIAGNOSIS — H540 Blindness, both eyes: Secondary | ICD-10-CM | POA: Diagnosis present

## 2015-04-08 DIAGNOSIS — Z9842 Cataract extraction status, left eye: Secondary | ICD-10-CM | POA: Diagnosis not present

## 2015-04-08 DIAGNOSIS — J449 Chronic obstructive pulmonary disease, unspecified: Secondary | ICD-10-CM | POA: Diagnosis present

## 2015-04-08 DIAGNOSIS — Z96612 Presence of left artificial shoulder joint: Secondary | ICD-10-CM | POA: Diagnosis present

## 2015-04-08 DIAGNOSIS — I4891 Unspecified atrial fibrillation: Secondary | ICD-10-CM | POA: Diagnosis present

## 2015-04-08 DIAGNOSIS — R410 Disorientation, unspecified: Secondary | ICD-10-CM | POA: Diagnosis not present

## 2015-04-08 DIAGNOSIS — R945 Abnormal results of liver function studies: Secondary | ICD-10-CM

## 2015-04-08 DIAGNOSIS — Z66 Do not resuscitate: Secondary | ICD-10-CM | POA: Diagnosis present

## 2015-04-08 DIAGNOSIS — R05 Cough: Secondary | ICD-10-CM | POA: Diagnosis not present

## 2015-04-08 DIAGNOSIS — Z881 Allergy status to other antibiotic agents status: Secondary | ICD-10-CM | POA: Diagnosis not present

## 2015-04-08 DIAGNOSIS — E785 Hyperlipidemia, unspecified: Secondary | ICD-10-CM | POA: Diagnosis present

## 2015-04-08 DIAGNOSIS — Z87891 Personal history of nicotine dependence: Secondary | ICD-10-CM | POA: Diagnosis not present

## 2015-04-08 DIAGNOSIS — J9620 Acute and chronic respiratory failure, unspecified whether with hypoxia or hypercapnia: Secondary | ICD-10-CM | POA: Diagnosis present

## 2015-04-08 LAB — BASIC METABOLIC PANEL
ANION GAP: 7 (ref 5–15)
BUN: 16 mg/dL (ref 6–23)
CO2: 31 mmol/L (ref 19–32)
CREATININE: 0.99 mg/dL (ref 0.50–1.10)
Calcium: 8.7 mg/dL (ref 8.4–10.5)
Chloride: 97 mmol/L (ref 96–112)
GFR, EST AFRICAN AMERICAN: 58 mL/min — AB (ref >90)
GFR, EST NON AFRICAN AMERICAN: 50 mL/min — AB (ref >90)
Glucose, Bld: 141 mg/dL — ABNORMAL HIGH (ref 70–99)
Potassium: 5 mmol/L (ref 3.5–5.1)
Sodium: 135 mmol/L (ref 135–145)

## 2015-04-08 LAB — CBC WITH DIFFERENTIAL/PLATELET
Basophils Absolute: 0 10*3/uL (ref 0.0–0.1)
Basophils Relative: 1 % (ref 0–1)
EOS ABS: 0 10*3/uL (ref 0.0–0.7)
Eosinophils Relative: 0 % (ref 0–5)
HCT: 44.4 % (ref 36.0–46.0)
HEMOGLOBIN: 14.4 g/dL (ref 12.0–15.0)
Lymphocytes Relative: 14 % (ref 12–46)
Lymphs Abs: 1 10*3/uL (ref 0.7–4.0)
MCH: 29.9 pg (ref 26.0–34.0)
MCHC: 32.4 g/dL (ref 30.0–36.0)
MCV: 92.1 fL (ref 78.0–100.0)
MONOS PCT: 7 % (ref 3–12)
Monocytes Absolute: 0.5 10*3/uL (ref 0.1–1.0)
NEUTROS PCT: 78 % — AB (ref 43–77)
Neutro Abs: 5.9 10*3/uL (ref 1.7–7.7)
PLATELETS: 131 10*3/uL — AB (ref 150–400)
RBC: 4.82 MIL/uL (ref 3.87–5.11)
RDW: 13.9 % (ref 11.5–15.5)
WBC: 7.5 10*3/uL (ref 4.0–10.5)

## 2015-04-08 LAB — BRAIN NATRIURETIC PEPTIDE: B NATRIURETIC PEPTIDE 5: 539.9 pg/mL — AB (ref 0.0–100.0)

## 2015-04-08 LAB — BLOOD GAS, ARTERIAL
Acid-Base Excess: 3.3 mmol/L — ABNORMAL HIGH (ref 0.0–2.0)
BICARBONATE: 27.4 meq/L — AB (ref 20.0–24.0)
Drawn by: 235321
FIO2: 1 %
O2 SAT: 87.8 %
PATIENT TEMPERATURE: 98.6
PCO2 ART: 41.3 mmHg (ref 35.0–45.0)
TCO2: 23.7 mmol/L (ref 0–100)
pH, Arterial: 7.437 (ref 7.350–7.450)
pO2, Arterial: 52.6 mmHg — ABNORMAL LOW (ref 80.0–100.0)

## 2015-04-08 LAB — I-STAT TROPONIN, ED: Troponin i, poc: 0.02 ng/mL (ref 0.00–0.08)

## 2015-04-08 MED ORDER — IOHEXOL 350 MG/ML SOLN
100.0000 mL | Freq: Once | INTRAVENOUS | Status: AC | PRN
  Administered 2015-04-08: 80 mL via INTRAVENOUS

## 2015-04-08 MED ORDER — DEXTROSE 5 % IV SOLN: 500.0000 mg | Freq: Once | INTRAVENOUS | Status: DC

## 2015-04-08 MED ORDER — DEXTROSE 5 % IV SOLN
1.0000 g | Freq: Once | INTRAVENOUS | Status: AC
  Administered 2015-04-08: 1 g via INTRAVENOUS
  Filled 2015-04-08: qty 10

## 2015-04-08 NOTE — ED Notes (Signed)
Pt's O2 on RA was in the 60s. Pt place on non-rebreather at 15 L and O2 came up to 97%. Pt alert and oriented to person, place and situation at this time. Pt states she was having some confusion earlier tonight and her husband checked her oxygen, but she doesn't remember what her O2 was at home.

## 2015-04-08 NOTE — ED Notes (Signed)
Nurse currently in room starting IV

## 2015-04-08 NOTE — ED Notes (Signed)
Bed: PO14 Expected date:  Expected time:  Means of arrival:  Comments: EMS 81yo F altered mental status

## 2015-04-08 NOTE — ED Provider Notes (Signed)
CSN: [id number]     Arrival date & time 04/08/15  2042 History   First MD Initiated Contact with Patient 04/08/15 2051     Chief Complaint  Patient presents with  . Altered Mental Status  . hypoxia      (Consider location/radiation/quality/duration/timing/severity/associated sxs/prior Treatment) HPI Comments: Patient presents with hypoxia. She has a history of atrial fibrillation and is on Eliquis.  She was here 2 days ago after sustaining a fall. She had facial trauma with nasal bone fractures. She had a head CT that was negative. Her husband states that she's not been acting right throughout today. She did take 1 Percocet early this morning. She's been less alert throughout the day. She hasn't been eating or drinking much throughout the day. She's been complaining of nausea throughout the day. He checked her oxygen saturation and it was 49% on his portable pulse ox at home. She does have home oxygen to use only at night as needed. She normally uses 2 L only at night. He placed her on oxygen and called EMS. When EMS got there her oxygen saturation was 60%. She was given a nebulizer treatment in route and her mental status seemed to improve per EMS. The patient denies any shortness of breath. She denies any chest pain other than she feels sore all over from the recent fall. She denies any specific chest injury from the fall. She denies any pain when she breathes. She denies any leg swelling. She denies any fevers. Per husband she was confused but is more oriented now.  Patient is a 81 y.o. female presenting with altered mental status.  Altered Mental Status Presenting symptoms: confusion   Associated symptoms: nausea and weakness (generalized)   Associated symptoms: no abdominal pain, no fever, no headaches, no rash and no vomiting     Past Medical History  Diagnosis Date  . Hx of colonic polyps     last colonoscopy 2005, repeat was due 2010 Dr. Sharlett Iles  . Atrial fibrillation     Dr  Percival Spanish  . Fracture 1995, 2009    RUE; LUE Dr. Durward Fortes  . 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 Cystitis   . Complication of anesthesia     difficulty with arousal post op  . Hyperlipidemia   . Hyperlipidemia   . Osteoporosis   . Cerebrovascular accident 01/09  . Hypothyroidism   . Dysrhythmia    Past Surgical History  Procedure Laterality Date  . Tonsillectomy and adenoidectomy    . Vocal cord polyps    . Thyroid needle biopsy  2002  . Appendectomy    . Cataract extraction      [facility]ilat  . Colonoscopy w/ polypectomy  2005    Diverticulosis;Dr. Sharlett Iles  . Total hip arthroplasty  2008    CHF & RAF post op   . Total shoulder replacement  2010    Dr Durward Fortes - left shoulder  . Mohs surgery  2013    nasal Basal Cell; Dr Sarajane Jews  . Excision metacarpal mass Right 08/17/2013    Procedure: RIGHT LONG EXCISION MASS AND DIP JOINT DEBRIDEMENT;  Surgeon: Tennis Must, MD;  Location: [facility];  Service: Orthopedics;  Laterality: Right;  . Femoral hernia repair Right 08/17/2014    Procedure: OPEN REPAIR RIGHT FEMORAL HERNIA  WITH INSERTION OF MESH;  Surgeon: Adin Hector, MD;  Location: [facility];  Service: General;  Laterality: Right;  . Insertion of mesh  08/17/2014    Procedure: INSERTION OF MESH;  Surgeon: Adin Hector, MD;  Location: [facility];  Service: General;;  . Hernia repair    . Inguinal hernia repair Right 11/15/2014    Procedure: LAPAROSCOPIC RIGHT INGUINAL HERNIA WITH MESH;  Surgeon: Alphonsa Overall, MD;  Location: [facility];  Service: General;  Laterality: Right;   Family History  Problem Relation Age of Onset  . Stroke Father     > 44  . Diabetes Father   . Leukemia Mother   . Cancer Mother     leukemia  . Colon cancer Brother     Valve replacement  . Heart attack Brother   . Breast cancer Sister   . Ovarian cancer Sister   . Lung cancer Sister     smoker  . Prostate cancer Brother   . Cancer Brother     bladder    History  Substance Use Topics  . Smoking status: Former Smoker -- 1.00 packs/day for 45 years    Types: Cigarettes    Quit date: 12/23/1993  . Smokeless tobacco: Never Used     Comment: Smoked 1950-1995, up to 1 ppd  . Alcohol Use: No   OB History    No data available     Review of Systems  Constitutional: Positive for fatigue. Negative for fever, chills and diaphoresis.  HENT: Negative for congestion, rhinorrhea and sneezing.   Eyes: Negative.   Respiratory: Positive for cough (chronic). Negative for chest tightness and shortness of breath.        Hypoxia  Cardiovascular: Negative for chest pain and leg swelling.  Gastrointestinal: Positive for nausea. Negative for vomiting, abdominal pain, diarrhea and blood in stool.  Genitourinary: Negative for frequency, hematuria, flank pain and difficulty urinating.  Musculoskeletal: Negative for back pain and arthralgias.  Skin: Negative for rash.  Neurological: Positive for weakness (generalized). Negative for dizziness, speech difficulty, numbness and headaches.  Psychiatric/Behavioral: Positive for confusion.      Allergies  Hydrocodone; Desmopressin acetate; Dorzolamide hcl; Levofloxacin; and Ciprofloxacin  Home Medications   Prior to Admission medications   Medication Sig Start Date End Date Taking? Authorizing Provider  acetaminophen (TYLENOL) 325 MG tablet You can take 2 tablets every 4 hours as needed.  You cannot take more than 4000 mg of tylenol per day.  This is also in your sleep aide, and it is in your prescribed pain medicine. So you need to figure out.  Do not take more than 12 tablets of tylenol (acetaminophen) per day. 08/19/14  Yes Earnstine Regal, PA-C  apixaban (ELIQUIS) 2.5 MG TABS tablet Take 1 tablet (2.5 mg total) by mouth 2 (two) times daily. 11/11/14  Yes Minus Breeding, MD  B Complex Vitamins (VITAMIN B COMPLEX) TABS Take 1 tablet by mouth daily.    Yes Historical Provider, MD  calcium carbonate (TUMS -  DOSED IN MG ELEMENTAL CALCIUM) 500 MG chewable tablet Chew 2 tablets by mouth daily as needed for indigestion or heartburn (indigestion).   Yes Historical Provider, MD  Calcium Carbonate-Vitamin D (CALCIUM-VITAMIN D) 500-200 MG-UNIT per tablet Take 1 tablet by mouth daily.    Yes Historical Provider, MD  Cholecalciferol (VITAMIN D) 2000 UNITS tablet Take 2,000 Units by mouth daily.   Yes Historical Provider, MD  Cranberry (CRANBERRY CONCENTRATE) 500 MG CAPS Take 1 capsule by mouth 2 (two) times daily.    Yes Historical Provider, MD  digoxin (LANOXIN) 0.125 MG tablet Take 0.125 mg by mouth daily.   Yes Historical  Provider, MD  diltiazem (CARDIZEM CD) 120 MG 24 hr capsule TAKE (1) CAPSULE DAILY. 03/23/15  Yes Minus Breeding, MD  diphenhydramine-acetaminophen (TYLENOL PM) 25-500 MG TABS Take 2 tablets by mouth at bedtime as needed (sleep).   Yes Historical Provider, MD  ENSURE PLUS (ENSURE PLUS) LIQD Take 1 Can by mouth daily. 12/20/14  Yes Hendricks Limes, MD  estradiol (ESTRACE) 0.1 MG/GM vaginal cream Place 2 g vaginally 2 (two) times a week. On Wednesdays & Saturdays.   Yes Historical Provider, MD  levothyroxine (SYNTHROID, LEVOTHROID) 50 MCG tablet TAKE 1 TABLET ONCE DAILY. 12/07/14  Yes Hendricks Limes, MD  LORazepam (ATIVAN) 1 MG tablet TAKE 1 TABLET EVERY 8-12 HOURS AS NEEDED. CAN BE HABIT FORMING & MAY AFFECT MENTAL ALERTNESS. 11/23/14  Yes Biagio Borg, MD  LUMIGAN 0.01 % SOLN Place 1 drop into both eyes at bedtime. 04/06/15  Yes Historical Provider, MD  Magnesium Oxide 500 MG TABS Take 1 tablet by mouth at bedtime.    Yes Historical Provider, MD  oxyCODONE-acetaminophen (PERCOCET/ROXICET) 5-325 MG per tablet 1/2-1 tab by mouth every 4 hours when necessary for pain 04/06/15  Yes Tanna Furry, MD  Probiotic Product (MISC INTESTINAL FLORA REGULAT) CAPS Take 1 capsule by mouth daily.     Yes Historical Provider, MD  sertraline (ZOLOFT) 50 MG tablet TAKE 1/2 TABLET DAILY FOR FIRST 10 DAYS, THEN  INCREASE TO 1 TABLET DAILY. 03/16/15  Yes Hendricks Limes, MD  timolol (TIMOPTIC) 0.5 % ophthalmic solution Place 1 drop into both eyes 2 (two) times daily.    Yes Historical Provider, MD  trimethoprim (TRIMPEX) 100 MG tablet Take 100 mg by mouth every other day.   Yes Historical Provider, MD  VYTORIN 10-40 MG per tablet TAKE 1 TABLET ONCE DAILY. 10/20/14  Yes Hendricks Limes, MD  DIGOX 125 MCG tablet TAKE 1 TABLET ONCE DAILY. Patient not taking: Reported on 04/06/2015 12/08/14   Minus Breeding, MD   BP 107/67 mmHg  Pulse 90  Temp(Src) 96.3 F (35.7 C) (Oral)  Resp 19  SpO2 96% Physical Exam  Constitutional: She is oriented to person, place, and time. She appears well-developed and well-nourished.  HENT:  Head: Normocephalic.  Positive ecchymosis around the eyes and across the nasal bridge  Eyes: Pupils are equal, round, and reactive to light.  Neck: Normal range of motion. Neck supple.  No significant pain along the spine  Cardiovascular: Normal rate, regular rhythm and normal heart sounds.   Pulmonary/Chest: Effort normal and breath sounds normal. No respiratory distress. She has no wheezes. She has no rales. She exhibits no tenderness.  Abdominal: Soft. Bowel sounds are normal. There is no tenderness. There is no rebound and no guarding.  Musculoskeletal: Normal range of motion. She exhibits no edema.  No edema or calf tenderness  Lymphadenopathy:    She has no cervical adenopathy.  Neurological: She is alert and oriented to person, place, and time.  Skin: Skin is warm and dry. No rash noted.  Psychiatric: She has a normal mood and affect.    ED Course  Procedures (including critical care time) Labs Review Labs Reviewed  BASIC METABOLIC PANEL - Abnormal; Notable for the following:    Glucose, Bld 141 (*)    GFR calc non Af Amer 50 (*)    GFR calc Af Amer 58 (*)    All other components within normal limits  BRAIN NATRIURETIC PEPTIDE - Abnormal; Notable for the following:     B Natriuretic Peptide  539.9 (*)    All other components within normal limits  CBC WITH DIFFERENTIAL/PLATELET - Abnormal; Notable for the following:    Platelets 131 (*)    Neutrophils Relative % 78 (*)    All other components within normal limits  BLOOD GAS, ARTERIAL - Abnormal; Notable for the following:    pO2, Arterial 52.6 (*)    Bicarbonate 27.4 (*)    Acid-Base Excess 3.3 (*)    All other components within normal limits  URINALYSIS, ROUTINE W REFLEX MICROSCOPIC  DIGOXIN LEVEL  I-STAT TROPOININ, ED    Imaging Review Dg Chest 2 View  04/08/2015   CLINICAL DATA:  Patient with altered mental status.  EXAM: CHEST  2 VIEW  COMPARISON:  Chest radiograph 11/25/2014  FINDINGS: Monitoring leads overlie the patient. Stable enlarged cardiac and mediastinal contours. Emphysematous change. Biapical scarring. Interval development of left-greater-than-right mid lung heterogeneous opacities. Additionally there are new consolidative opacities within the left lower lung. Suggestion of small bilateral pleural effusions.  IMPRESSION: New focal retrocardiac consolidation may represent pneumonia in the appropriate clinical setting. There are small bilateral pleural effusions. Additional heterogeneous opacities within the bilateral mid lungs may represent infection and/or possible edema. Recommend short term radiographic followup.   Electronically Signed   By: Lovey Newcomer M.D.   On: 04/08/2015 22:05   Ct Head Wo Contrast  04/08/2015   CLINICAL DATA:  Patient with altered mental status.  EXAM: CT HEAD WITHOUT CONTRAST  TECHNIQUE: Contiguous axial images were obtained from the base of the skull through the vertex without intravenous contrast.  COMPARISON:  Brain CT 04/06/2015  FINDINGS: Ventricles and sulci are appropriate for patient's age. Re- demonstrated chronic left basal ganglia infarct. No evidence for acute cortically based infarct, intracranial hemorrhage, mass lesion or mass-effect. Periventricular and  subcortical white matter hypodensity compatible with chronic small vessel ischemic changes. Bilateral cataract surgery. Paranasal sinuses are well aerated. Mastoid air cells unremarkable. Calvarium is intact.  IMPRESSION: No acute intracranial process.  Chronic small vessel ischemic changes.  Remote lacunar infarct within the left basal ganglia.   Electronically Signed   By: Lovey Newcomer M.D.   On: 04/08/2015 22:11   Ct Angio Chest Pe W/cm &/or Wo Cm  04/08/2015   CLINICAL DATA:  Hypoxia today. History of atrial fibrillation, mild aortic stenosis.  EXAM: CT ANGIOGRAPHY CHEST WITH CONTRAST  TECHNIQUE: Multidetector CT imaging of the chest was performed using the standard protocol during bolus administration of intravenous contrast. Multiplanar CT image reconstructions and MIPs were obtained to evaluate the vascular anatomy.  CONTRAST:  36mL OMNIPAQUE IOHEXOL 350 MG/ML SOLN  COMPARISON:  Chest radiograph April 08, 2015 at 2146 hours  FINDINGS: Adequate pulmonary arterial contrast opacification for assessment. Main pulmonary artery is not enlarged. No pulmonary arterial filling defect to the subsegmental branches. Heart size is mildly enlarged, accentuated by a mild pectus excavatum. No pericardial fluid collections. Thoracic aorta is normal in course and caliber with moderate to severe calcific atherosclerosis. Prominent mediastinal lymph nodes, measuring up to 12 mm at precarinal level.  Moderate to severe centrilobular emphysema. Patchy consolidation upper lobes bilaterally associated with small to moderate bilateral pleural effusions. Mildly enhancing atelectasis in lung bases bilaterally with superimposed patchy areas of hypoenhancement. Tracheobronchial tree is patent and midline, no pneumothorax.  Included view of the abdomen is nonsuspicious, splenic granulomas noted. Patient is osteopenic, mild degenerative change of thoracic spine. LEFT humeral arthroplasty.  Review of the MIP images confirms the above  findings.  IMPRESSION: No acute  pulmonary embolism.  Patchy consolidation consistent with multifocal pneumonia with small bilateral pleural effusions and lower lobe atelectasis. Moderate to severe centrilobular emphysema.  Mild mediastinal lymphadenopathy is likely reactive.  Mild cardiomegaly.   Electronically Signed   By: Elon Alas   On: 04/08/2015 23:13     EKG Interpretation   Date/Time:  Saturday April 08 2015 21:37:02 EDT Ventricular Rate:  85 PR Interval:  227 QRS Duration: 72 QT Interval:  309 QTC Calculation: 367 R Axis:   85 Text Interpretation:  Sinus or ectopic atrial rhythm Borderline right axis  deviation Probable anteroseptal infarct, old Borderline repolarization  abnormality since last tracing no significant change Confirmed by Brendaly Townsel   MD, Kelechi Astarita ([id number]) on 04/08/2015 9:50:48 PM      MDM   Final diagnoses:  Altered mental status  Hypoxia  Community acquired pneumonia    Patient presents with hypoxia and some altered mental status. She is alert and oriented on my exam. She has no evidence of chest trauma. Her initial chest x-ray shows possible retrocardiac infiltrate. Given her profound hypoxia I did do a CT chest which did not show any evidence of pulmonary embolus however there is multifocal pneumonia. She has no recent hospitalizations or rehabilitation stays. She was treated for community-acquired pneumonia with Rocephin and Zithromax. She is allergic to levaquin.  Given her altered mental status I did repeat a head CT which did not show any evidence of a intracranial hemorrhage. She's afebrile and her blood pressures been stable without hypotension. She however has required a nonrebreather mask. I spoke with Dr. Roel Cluck with the hospitalist service to admit the patient to stepdown.    Malvin Johns, MD 04/09/15 [phone]

## 2015-04-08 NOTE — ED Notes (Signed)
Per EMS: Pt's husband called EMS because pt was acting altered. Pt alert, oriented to who and where she was but not to situation and time of day. Upon EMS arrival, pt had some wheezing and tightness in bilateral lung fields. Given 5 mg albuterol en route, lung sounds improved. Pt on oxygen 3 L [place] at night at home. Initial O2 was 70%, came up to 86% on non-rebreather. O2 came up to 97% with the neb tx. Pt now oriented to person, place, and situation. Pt denies pain. Pt did have fall 4 days ago during which she injured R arm and fractured her nose.

## 2015-04-09 ENCOUNTER — Encounter (HOSPITAL_COMMUNITY): Payer: Self-pay | Admitting: Internal Medicine

## 2015-04-09 DIAGNOSIS — R410 Disorientation, unspecified: Secondary | ICD-10-CM

## 2015-04-09 DIAGNOSIS — J690 Pneumonitis due to inhalation of food and vomit: Principal | ICD-10-CM

## 2015-04-09 DIAGNOSIS — R4182 Altered mental status, unspecified: Secondary | ICD-10-CM | POA: Insufficient documentation

## 2015-04-09 DIAGNOSIS — R0902 Hypoxemia: Secondary | ICD-10-CM | POA: Insufficient documentation

## 2015-04-09 DIAGNOSIS — I481 Persistent atrial fibrillation: Secondary | ICD-10-CM

## 2015-04-09 LAB — URINALYSIS, ROUTINE W REFLEX MICROSCOPIC
Bilirubin Urine: NEGATIVE
Glucose, UA: NEGATIVE mg/dL
Hgb urine dipstick: NEGATIVE
Ketones, ur: NEGATIVE mg/dL
NITRITE: NEGATIVE
PROTEIN: NEGATIVE mg/dL
Urobilinogen, UA: 0.2 mg/dL (ref 0.0–1.0)
pH: 5.5 (ref 5.0–8.0)

## 2015-04-09 LAB — TROPONIN I
TROPONIN I: 0.04 ng/mL — AB (ref <0.031)
TROPONIN I: 0.03 ng/mL (ref <0.031)
Troponin I: 0.03 ng/mL (ref <0.031)

## 2015-04-09 LAB — COMPREHENSIVE METABOLIC PANEL
ALBUMIN: 3.9 g/dL (ref 3.5–5.2)
ALT: 137 U/L — ABNORMAL HIGH (ref 0–35)
AST: 154 U/L — AB (ref 0–37)
Alkaline Phosphatase: 47 U/L (ref 39–117)
Anion gap: 7 (ref 5–15)
BUN: 16 mg/dL (ref 6–23)
CO2: 32 mmol/L (ref 19–32)
CREATININE: 0.92 mg/dL (ref 0.50–1.10)
Calcium: 8.5 mg/dL (ref 8.4–10.5)
Chloride: 98 mmol/L (ref 96–112)
GFR calc non Af Amer: 55 mL/min — ABNORMAL LOW (ref >90)
GFR, EST AFRICAN AMERICAN: 64 mL/min — AB (ref >90)
Glucose, Bld: 124 mg/dL — ABNORMAL HIGH (ref 70–99)
POTASSIUM: 4.7 mmol/L (ref 3.5–5.1)
SODIUM: 137 mmol/L (ref 135–145)
Total Bilirubin: 0.6 mg/dL (ref 0.3–1.2)
Total Protein: 6.6 g/dL (ref 6.0–8.3)

## 2015-04-09 LAB — MRSA PCR SCREENING: MRSA by PCR: NEGATIVE

## 2015-04-09 LAB — URINE MICROSCOPIC-ADD ON

## 2015-04-09 LAB — DIGOXIN LEVEL: DIGOXIN LVL: 0.9 ng/mL (ref 0.8–2.0)

## 2015-04-09 LAB — GLUCOSE, CAPILLARY: Glucose-Capillary: 110 mg/dL — ABNORMAL HIGH (ref 70–99)

## 2015-04-09 MED ORDER — POLYETHYLENE GLYCOL 3350 17 G PO PACK
17.0000 g | PACK | Freq: Every day | ORAL | Status: DC
  Administered 2015-04-09 – 2015-04-11 (×3): 17 g via ORAL
  Filled 2015-04-09 (×7): qty 1

## 2015-04-09 MED ORDER — IPRATROPIUM BROMIDE 0.02 % IN SOLN: 0.5000 mg | Freq: Four times a day (QID) | RESPIRATORY_TRACT | Status: DC

## 2015-04-09 MED ORDER — SERTRALINE HCL 50 MG PO TABS
50.0000 mg | ORAL_TABLET | Freq: Every day | ORAL | Status: DC
Start: 2015-04-09 — End: 2015-04-10
  Administered 2015-04-09 – 2015-04-10 (×2): 50 mg via ORAL
  Filled 2015-04-09 (×2): qty 1

## 2015-04-09 MED ORDER — PIPERACILLIN-TAZOBACTAM 3.375 G IVPB
3.3750 g | Freq: Three times a day (TID) | INTRAVENOUS | Status: DC
  Administered 2015-04-09 – 2015-04-14 (×17): 3.375 g via INTRAVENOUS
  Filled 2015-04-09 (×20): qty 50

## 2015-04-09 MED ORDER — APIXABAN 2.5 MG PO TABS
2.5000 mg | ORAL_TABLET | Freq: Two times a day (BID) | ORAL | Status: DC
  Administered 2015-04-09 – 2015-04-14 (×11): 2.5 mg via ORAL
  Filled 2015-04-09 (×10): qty 1

## 2015-04-09 MED ORDER — ALBUTEROL SULFATE (2.5 MG/3ML) 0.083% IN NEBU: 2.5000 mg | INHALATION_SOLUTION | RESPIRATORY_TRACT | Status: DC | PRN

## 2015-04-09 MED ORDER — DEXTROSE 5 % IV SOLN
500.0000 mg | INTRAVENOUS | Status: DC
  Administered 2015-04-09 – 2015-04-13 (×5): 500 mg via INTRAVENOUS
  Filled 2015-04-09 (×6): qty 500

## 2015-04-09 MED ORDER — LEVOTHYROXINE SODIUM 50 MCG PO TABS
50.0000 ug | ORAL_TABLET | Freq: Every day | ORAL | Status: DC
  Administered 2015-04-10 – 2015-04-14 (×5): 50 ug via ORAL
  Filled 2015-04-09 (×6): qty 1

## 2015-04-09 MED ORDER — LATANOPROST 0.005 % OP SOLN
1.0000 [drp] | Freq: Every day | OPHTHALMIC | Status: DC
  Administered 2015-04-09 – 2015-04-13 (×6): 1 [drp] via OPHTHALMIC
  Filled 2015-04-09 (×2): qty 2.5

## 2015-04-09 MED ORDER — GUAIFENESIN ER 600 MG PO TB12
600.0000 mg | ORAL_TABLET | Freq: Two times a day (BID) | ORAL | Status: DC
  Administered 2015-04-09 – 2015-04-14 (×10): 600 mg via ORAL
  Filled 2015-04-09 (×12): qty 1

## 2015-04-09 MED ORDER — DIGOXIN 125 MCG PO TABS
0.1250 mg | ORAL_TABLET | Freq: Every day | ORAL | Status: DC
  Administered 2015-04-09 – 2015-04-14 (×6): 0.125 mg via ORAL
  Filled 2015-04-09 (×6): qty 1

## 2015-04-09 MED ORDER — TRIMETHOPRIM 100 MG PO TABS
100.0000 mg | ORAL_TABLET | ORAL | Status: DC
  Administered 2015-04-09 – 2015-04-13 (×3): 100 mg via ORAL
  Filled 2015-04-09 (×3): qty 1

## 2015-04-09 MED ORDER — LIP MEDEX EX OINT
TOPICAL_OINTMENT | CUTANEOUS | Status: DC | PRN
  Filled 2015-04-09: qty 7

## 2015-04-09 MED ORDER — TIMOLOL MALEATE 0.5 % OP SOLN
1.0000 [drp] | Freq: Two times a day (BID) | OPHTHALMIC | Status: DC
  Administered 2015-04-09 – 2015-04-14 (×11): 1 [drp] via OPHTHALMIC
  Filled 2015-04-09: qty 5

## 2015-04-09 MED ORDER — ZOLPIDEM TARTRATE 5 MG PO TABS
5.0000 mg | ORAL_TABLET | Freq: Once | ORAL | Status: AC
  Administered 2015-04-09: 5 mg via ORAL
  Filled 2015-04-09: qty 1

## 2015-04-09 MED ORDER — DILTIAZEM HCL ER COATED BEADS 120 MG PO CP24
120.0000 mg | ORAL_CAPSULE | Freq: Every day | ORAL | Status: DC
  Administered 2015-04-09 – 2015-04-14 (×6): 120 mg via ORAL
  Filled 2015-04-09 (×6): qty 1

## 2015-04-09 MED ORDER — LORAZEPAM 1 MG PO TABS
1.0000 mg | ORAL_TABLET | Freq: Four times a day (QID) | ORAL | Status: DC | PRN
  Administered 2015-04-10 – 2015-04-13 (×4): 1 mg via ORAL
  Filled 2015-04-09 (×4): qty 1

## 2015-04-09 MED ORDER — SODIUM CHLORIDE 0.9 % IV SOLN
INTRAVENOUS | Status: AC
  Administered 2015-04-09: 02:00:00 via INTRAVENOUS

## 2015-04-09 MED ORDER — IPRATROPIUM-ALBUTEROL 0.5-2.5 (3) MG/3ML IN SOLN
3.0000 mL | Freq: Three times a day (TID) | RESPIRATORY_TRACT | Status: DC
  Administered 2015-04-09 – 2015-04-11 (×7): 3 mL via RESPIRATORY_TRACT
  Filled 2015-04-09 (×7): qty 3

## 2015-04-09 MED ORDER — FLORA-Q PO CAPS
1.0000 | ORAL_CAPSULE | Freq: Every day | ORAL | Status: DC
  Administered 2015-04-10 – 2015-04-14 (×5): 1 via ORAL
  Filled 2015-04-09 (×6): qty 1

## 2015-04-09 NOTE — Progress Notes (Signed)
Patient Demographics  Kristine Simmons, is a 81 y.o. female, DOB - [birth year], WSF:[id number]  Admit date - 04/08/2015   Admitting Physician Toy Baker, MD  Outpatient Primary MD for the patient is Unice Cobble, MD  LOS - 1   Chief Complaint  Patient presents with  . Altered Mental Status  . hypoxia       Admission history of present illness/brief narrative: Kristine Simmons is a 81 y.o. female with  past medical history of colonic polyps; Atrial fibrillation; Blindness; Cystitis; Complication of anesthesia; Hyperlipidemia; Hyperlipidemia; Osteoporosis; Cerebrovascular accident (01/09); Hypothyroidism; and Dysrhythmia.  Patient with history of chronic aspiration followed by pulmonology for chronic hypoxia needing oxygen at night. She has chronic cough worse with swallowing. She has history of chronic cough likely secondary to chronic aspiration lung scarring. Patient has pulse oximetry at home. Per husband she had a recent fall while working at the yard she lost her balance and fell hitting her face. She was seen in the emergency department on the 14th and was noted to have nasal bridge fracture.Agent presents with hypoxia, CT Angela chest showing evidence of multilevel pulmonary pneumonia, no evidence of PE, hospitalist requested to admit the patient.  Subjective:   Kristine Simmons today has, No headache, No chest pain, No abdominal pain - No Nausea, No new weakness tingling or numbness, reports cough and shortness of breath.  Assessment & Plan    Active Problems:   Atrial fibrillation   COPD, Gold grade A   Cough   CAP (community acquired pneumonia)   Aspiration pneumonia  Community-acquired pneumonia versus aspiration pneumonia - Patient high risk for aspiration, will consult SLP. - We'll continue with IV Zosyn and azithromycin. - Follow on blood  cultur, and sputum culture.  COPD - At this point patient has no wheezing, so no indication for steroids, we'll continue with nebulizer treatment.  Atrial fibrillation - Heart rate controlled on Cardizem and digoxin - Patient is on Eliquis for anticoagulation, patient had one fall in so many years, for now continue with anticoagulation. - ChadVAsc >2.  Hypothyroidism - Continue with Synthroid  Code Status: DO NOT RESUSCITATE  Family Communication: None at bedside  Disposition Plan: PT consult   Procedures  None   Consults   None   Medications  Scheduled Meds: . apixaban  2.5 mg Oral BID  . azithromycin  500 mg Intravenous Q24H  . digoxin  0.125 mg Oral Daily  . diltiazem  120 mg Oral Daily  . FLORA-Q  1 capsule Oral Daily  . guaiFENesin  600 mg Oral BID  . ipratropium-albuterol  3 mL Nebulization TID  . latanoprost  1 drop Both Eyes QHS  . levothyroxine  50 mcg Oral QAC breakfast  . piperacillin-tazobactam (ZOSYN)  IV  3.375 g Intravenous 3 times per day  . sertraline  50 mg Oral Daily  . timolol  1 drop Both Eyes BID  . trimethoprim  100 mg Oral Q48H   Continuous Infusions:  PRN Meds:.albuterol, LORazepam  DVT Prophylaxis  on Eliquis  Lab Results  Component Value Date   PLT 131* 04/08/2015    Antibiotics    Anti-infectives    Start     Dose/Rate Route Frequency Ordered Stop   04/09/15  2200  azithromycin (ZITHROMAX) 500 mg in dextrose 5 % 250 mL IVPB     500 mg 250 mL/hr over 60 Minutes Intravenous Every 24 hours 04/09/15 0139     04/09/15 1000  trimethoprim (TRIMPEX) tablet 100 mg     100 mg Oral Every 48 hours 04/09/15 0140     04/09/15 0145  piperacillin-tazobactam (ZOSYN) IVPB 3.375 g     3.375 g 12.5 mL/hr over 240 Minutes Intravenous 3 times per day 04/09/15 0140     04/08/15 2345  cefTRIAXone (ROCEPHIN) 1 g in dextrose 5 % 50 mL IVPB     1 g 100 mL/hr over 30 Minutes Intravenous  Once 04/08/15 2336 04/09/15 0028   04/08/15 2345   azithromycin (ZITHROMAX) 500 mg in dextrose 5 % 250 mL IVPB  Status:  Discontinued     500 mg 250 mL/hr over 60 Minutes Intravenous  Once 04/08/15 2336 04/09/15 0140          Objective:   Filed Vitals:   04/09/15 1038 04/09/15 1044 04/09/15 1140 04/09/15 1203  BP:      Pulse: 72     Temp:   98.3 F (36.8 C)   TempSrc:   Oral   Resp:      Height:      Weight:      SpO2:  95%  90%    Wt Readings from Last 3 Encounters:  04/09/15 56.3 kg (124 lb 1.9 oz)  03/07/15 57.607 kg (127 lb)  01/02/15 56.246 kg (124 lb)     Intake/Output Summary (Last 24 hours) at 04/09/15 1309 Last data filed at 04/09/15 4098  Gross per 24 hour  Intake    320 ml  Output    400 ml  Net    -80 ml     Physical Exam  Awake Alert, Oriented, No new F.N deficits, Normal affect Patient had bruising on the face area, around the nose, and periorbital area. Supple Neck,No JVD, No cervical lymphadenopathy appriciated.  Symmetrical Chest wall movement, Good air movement bilaterally, no wheezing RRR ,No Gallops,Rubs, significant for systolic murmur No Parasternal Heave +ve B.Sounds, Abd Soft, No tenderness, No organomegaly appriciated, No rebound - guarding or rigidity. No Cyanosis, Clubbing or edema,   Data Review   Micro Results Recent Results (from the past 240 hour(s))  MRSA PCR Screening     Status: None   Collection Time: 04/09/15  1:36 AM  Result Value Ref Range Status   MRSA by PCR NEGATIVE NEGATIVE Final    Comment:        The GeneXpert MRSA Assay (FDA approved for NASAL specimens only), is one component of a comprehensive MRSA colonization surveillance program. It is not intended to diagnose MRSA infection nor to guide or monitor treatment for MRSA infections.     Radiology Reports Dg Chest 2 View  04/08/2015   CLINICAL DATA:  Patient with altered mental status.  EXAM: CHEST  2 VIEW  COMPARISON:  Chest radiograph 11/25/2014  FINDINGS: Monitoring leads overlie the patient.  Stable enlarged cardiac and mediastinal contours. Emphysematous change. Biapical scarring. Interval development of left-greater-than-right mid lung heterogeneous opacities. Additionally there are new consolidative opacities within the left lower lung. Suggestion of small bilateral pleural effusions.  IMPRESSION: New focal retrocardiac consolidation may represent pneumonia in the appropriate clinical setting. There are small bilateral pleural effusions. Additional heterogeneous opacities within the bilateral mid lungs may represent infection and/or possible edema. Recommend short term radiographic followup.   Electronically Signed  By: Lovey Newcomer M.D.   On: 04/08/2015 22:05   Ct Head Wo Contrast  04/08/2015   CLINICAL DATA:  Patient with altered mental status.  EXAM: CT HEAD WITHOUT CONTRAST  TECHNIQUE: Contiguous axial images were obtained from the base of the skull through the vertex without intravenous contrast.  COMPARISON:  Brain CT 04/06/2015  FINDINGS: Ventricles and sulci are appropriate for patient's age. Re- demonstrated chronic left basal ganglia infarct. No evidence for acute cortically based infarct, intracranial hemorrhage, mass lesion or mass-effect. Periventricular and subcortical white matter hypodensity compatible with chronic small vessel ischemic changes. Bilateral cataract surgery. Paranasal sinuses are well aerated. Mastoid air cells unremarkable. Calvarium is intact.  IMPRESSION: No acute intracranial process.  Chronic small vessel ischemic changes.  Remote lacunar infarct within the left basal ganglia.   Electronically Signed   By: Lovey Newcomer M.D.   On: 04/08/2015 22:11   Ct Angio Chest Pe W/cm &/or Wo Cm  04/08/2015   CLINICAL DATA:  Hypoxia today. History of atrial fibrillation, mild aortic stenosis.  EXAM: CT ANGIOGRAPHY CHEST WITH CONTRAST  TECHNIQUE: Multidetector CT imaging of the chest was performed using the standard protocol during bolus administration of intravenous  contrast. Multiplanar CT image reconstructions and MIPs were obtained to evaluate the vascular anatomy.  CONTRAST:  26mL OMNIPAQUE IOHEXOL 350 MG/ML SOLN  COMPARISON:  Chest radiograph April 08, 2015 at 2146 hours  FINDINGS: Adequate pulmonary arterial contrast opacification for assessment. Main pulmonary artery is not enlarged. No pulmonary arterial filling defect to the subsegmental branches. Heart size is mildly enlarged, accentuated by a mild pectus excavatum. No pericardial fluid collections. Thoracic aorta is normal in course and caliber with moderate to severe calcific atherosclerosis. Prominent mediastinal lymph nodes, measuring up to 12 mm at precarinal level.  Moderate to severe centrilobular emphysema. Patchy consolidation upper lobes bilaterally associated with small to moderate bilateral pleural effusions. Mildly enhancing atelectasis in lung bases bilaterally with superimposed patchy areas of hypoenhancement. Tracheobronchial tree is patent and midline, no pneumothorax.  Included view of the abdomen is nonsuspicious, splenic granulomas noted. Patient is osteopenic, mild degenerative change of thoracic spine. LEFT humeral arthroplasty.  Review of the MIP images confirms the above findings.  IMPRESSION: No acute pulmonary embolism.  Patchy consolidation consistent with multifocal pneumonia with small bilateral pleural effusions and lower lobe atelectasis. Moderate to severe centrilobular emphysema.  Mild mediastinal lymphadenopathy is likely reactive.  Mild cardiomegaly.   Electronically Signed   By: Elon Alas   On: 04/08/2015 23:13    CBC  Recent Labs Lab 04/06/15 1248 04/08/15 2135  WBC 5.5 7.5  HGB 14.4 14.4  HCT 44.4 44.4  PLT 175 131*  MCV 89.9 92.1  MCH 29.1 29.9  MCHC 32.4 32.4  RDW 13.9 13.9  LYMPHSABS 1.6 1.0  MONOABS 0.5 0.5  EOSABS 0.1 0.0  BASOSABS 0.1 0.0    Chemistries   Recent Labs Lab 04/06/15 1248 04/08/15 2135 04/09/15 0210  NA 136 135 137  K 4.6  5.0 4.7  CL 100 97 98  CO2 28 31 32  GLUCOSE 93 141* 124*  BUN 15 16 16   CREATININE 0.97 0.99 0.92  CALCIUM 9.1 8.7 8.5  AST  --   --  154*  ALT  --   --  137*  ALKPHOS  --   --  47  BILITOT  --   --  0.6   ------------------------------------------------------------------------------------------------------------------ estimated creatinine clearance is 39 mL/min (by C-G formula based on Cr of 0.92). ------------------------------------------------------------------------------------------------------------------  No results for input(s): HGBA1C in the last 72 hours. ------------------------------------------------------------------------------------------------------------------ No results for input(s): CHOL, HDL, LDLCALC, TRIG, CHOLHDL, LDLDIRECT in the last 72 hours. ------------------------------------------------------------------------------------------------------------------ No results for input(s): TSH, T4TOTAL, T3FREE, THYROIDAB in the last 72 hours.  Invalid input(s): FREET3 ------------------------------------------------------------------------------------------------------------------ No results for input(s): VITAMINB12, FOLATE, FERRITIN, TIBC, IRON, RETICCTPCT in the last 72 hours.  Coagulation profile  Recent Labs Lab 04/06/15 1248  INR 1.23    No results for input(s): DDIMER in the last 72 hours.  Cardiac Enzymes  Recent Labs Lab 04/09/15 0220 04/09/15 0924  TROPONINI 0.04* 0.03   ------------------------------------------------------------------------------------------------------------------ Invalid input(s): POCBNP     Time Spent in minutes   30 minutes   Ugochukwu Chichester M.D on 04/09/2015 at 1:09 PM  Between 7am to 7pm - Pager - [phone]  After 7pm go to [email or web address] - password [email or web address]  And look for the night coverage person covering for me after hours  [facility] Office  [phone]   **Disclaimer: This note may  have been dictated with voice recognition software. Similar sounding words can inadvertently be transcribed and this note may contain transcription errors which may not have been corrected upon publication of note.**

## 2015-04-09 NOTE — H&P (Signed)
PCP:  Unice Cobble, MD  Pulmonology [facility] Chief Complaint:  hypoxia  HPI: Kristine Simmons is a 81 y.o. female   has a past medical history of colonic polyps; Atrial fibrillation; Fracture (1995, 2009); Blindness; Cystitis; Complication of anesthesia; Hyperlipidemia; Hyperlipidemia; Osteoporosis; Cerebrovascular accident (01/09); Hypothyroidism; and Dysrhythmia.   Patient with history of chronic aspiration followed by pulmonology for chronic hypoxia needing oxygen at night. She has chronic cough worse with swallowing. She has history of chronic cough likely secondary to chronic aspiration lung scarring. Patient has pulse oximetry at home. Per husband she had a recent fall while working at the yard she lost her balance and fell hitting her face. She was seen in the emergency department on the 14th and was noted to have nasal bridge fracture. She was given Percocet for pain. Patient has been somewhat more altered and confused as per husband since she took it. Patient spent all day in bed she has not eaten anything.  At home she was noted to have hypoxia based on her home monitor down to 49s. Her husband put her on oxygen.  EMS was called to give a nebulizer treatment. Patient continues to be able to speak in full sentences. She arrived to emergency department and required nonrebreather in order to maintain her oxygen saturation. ABG was obtained showing pH of 7.437 PCO2 41 and PO2 52 on nonrebreather. Chest x-ray at first was unremarkable. CT angiogram chest has been done showed no evidence of PE but evidence of multilevel bone pneumonia. Patient denied any fevers does not meet sepsis criteria at this point. Given patient's persistent hypoxia hospitals has been called on admission.   Hospitalist was called for admission for multilobar pneumonia and hypoxia  Review of Systems:    Pertinent positives include: Persistent cough, confusion, shortness of  breath  Constitutional:  No weight loss, night sweats, Fevers, chills, fatigue, weight loss  HEENT:  No headaches, Difficulty swallowing,Tooth/dental problems,Sore throat,  No sneezing, itching, ear ache, nasal congestion, post nasal drip,  Cardio-vascular:  No chest pain, Orthopnea, PND, anasarca, dizziness, palpitations.no Bilateral lower extremity swelling  GI:  No heartburn, indigestion, abdominal pain, nausea, vomiting, diarrhea, change in bowel habits, loss of appetite, melena, blood in stool, hematemesis Resp:   No excess mucus, non-productive cough, No coughing up of blood.No change in color of mucus.No wheezing. Skin:  no rash or lesions. No jaundice GU:  no dysuria, change in color of urine, no urgency or frequency. No straining to urinate.  No flank pain.  Musculoskeletal:  No joint pain or no joint swelling. No decreased range of motion. No back pain.  Psych:  No change in mood or affect. No depression or anxiety. No memory loss.  Neuro: no localizing neurological complaints, no tingling, no weakness, no double vision, no gait abnormality, no slurred speech, no   Otherwise ROS are negative except for above, 10 systems were reviewed  Past Medical History: Past Medical History  Diagnosis Date  . Hx of colonic polyps     last colonoscopy 2005, repeat was due 2010 Dr. Sharlett Iles  . Atrial fibrillation     Dr Percival Spanish  . Fracture 1995, 2009    RUE; LUE Dr. Durward Fortes  . 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 Cystitis   . Complication of anesthesia     difficulty with arousal post op  . Hyperlipidemia   . Hyperlipidemia   . Osteoporosis   . Cerebrovascular  accident 01/09  . Hypothyroidism   . Dysrhythmia    Past Surgical History  Procedure Laterality Date  . Tonsillectomy and adenoidectomy    . Vocal cord polyps    . Thyroid needle biopsy  2002  . Appendectomy    . Cataract extraction      [place]  . Colonoscopy w/ polypectomy  2005     Diverticulosis;Dr. Sharlett Iles  . Total hip arthroplasty  2008    CHF & RAF post op   . Total shoulder replacement  2010    Dr Durward Fortes - left shoulder  . Mohs surgery  2013    nasal Basal Cell; Dr Sarajane Jews  . Excision metacarpal mass Right 08/17/2013    Procedure: RIGHT LONG EXCISION MASS AND DIP JOINT DEBRIDEMENT;  Surgeon: Tennis Must, MD;  Location: [place] [facility];  Service: Orthopedics;  Laterality: Right;  . Femoral hernia repair Right 08/17/2014    Procedure: OPEN REPAIR RIGHT FEMORAL HERNIA  WITH INSERTION OF MESH;  Surgeon: Adin Hector, MD;  Location: [facility];  Service: General;  Laterality: Right;  . Insertion of mesh  08/17/2014    Procedure: INSERTION OF MESH;  Surgeon: Adin Hector, MD;  Location: [facility];  Service: General;;  . Hernia repair    . Inguinal hernia repair Right 11/15/2014    Procedure: LAPAROSCOPIC RIGHT INGUINAL HERNIA WITH MESH;  Surgeon: Alphonsa Overall, MD;  Location: [facility];  Service: General;  Laterality: Right;     Medications: Prior to Admission medications   Medication Sig Start Date End Date Taking? Authorizing Provider  acetaminophen (TYLENOL) 325 MG tablet You can take 2 tablets every 4 hours as needed.  You cannot take more than 4000 mg of tylenol per day.  This is also in your sleep aide, and it is in your prescribed pain medicine. So you need to figure out.  Do not take more than 12 tablets of tylenol (acetaminophen) per day. 08/19/14  Yes Earnstine Regal, PA-C  apixaban (ELIQUIS) 2.5 MG TABS tablet Take 1 tablet (2.5 mg total) by mouth 2 (two) times daily. 11/11/14  Yes Minus Breeding, MD  B Complex Vitamins (VITAMIN B COMPLEX) TABS Take 1 tablet by mouth daily.    Yes Historical Provider, MD  calcium carbonate (TUMS - DOSED IN MG ELEMENTAL CALCIUM) 500 MG chewable tablet Chew 2 tablets by mouth daily as needed for indigestion or heartburn (indigestion).   Yes Historical Provider, MD  Calcium Carbonate-Vitamin D (CALCIUM-VITAMIN D)  500-200 MG-UNIT per tablet Take 1 tablet by mouth daily.    Yes Historical Provider, MD  Cholecalciferol (VITAMIN D) 2000 UNITS tablet Take 2,000 Units by mouth daily.   Yes Historical Provider, MD  Cranberry (CRANBERRY CONCENTRATE) 500 MG CAPS Take 1 capsule by mouth 2 (two) times daily.    Yes Historical Provider, MD  digoxin (LANOXIN) 0.125 MG tablet Take 0.125 mg by mouth daily.   Yes Historical Provider, MD  diltiazem (CARDIZEM CD) 120 MG 24 hr capsule TAKE (1) CAPSULE DAILY. 03/23/15  Yes Minus Breeding, MD  diphenhydramine-acetaminophen (TYLENOL PM) 25-500 MG TABS Take 2 tablets by mouth at bedtime as needed (sleep).   Yes Historical Provider, MD  ENSURE PLUS (ENSURE PLUS) LIQD Take 1 Can by mouth daily. 12/20/14  Yes Hendricks Limes, MD  estradiol (ESTRACE) 0.1 MG/GM vaginal cream Place 2 g vaginally 2 (two) times a week. On Wednesdays & Saturdays.   Yes Historical Provider, MD  levothyroxine (SYNTHROID, LEVOTHROID) 50 MCG tablet TAKE 1  TABLET ONCE DAILY. 12/07/14  Yes Hendricks Limes, MD  LORazepam (ATIVAN) 1 MG tablet TAKE 1 TABLET EVERY 8-12 HOURS AS NEEDED. CAN BE HABIT FORMING & MAY AFFECT MENTAL ALERTNESS. 11/23/14  Yes Biagio Borg, MD  LUMIGAN 0.01 % SOLN Place 1 drop into both eyes at bedtime. 04/06/15  Yes Historical Provider, MD  Magnesium Oxide 500 MG TABS Take 1 tablet by mouth at bedtime.    Yes Historical Provider, MD  oxyCODONE-acetaminophen (PERCOCET/ROXICET) 5-325 MG per tablet 1/2-1 tab by mouth every 4 hours when necessary for pain 04/06/15  Yes Tanna Furry, MD  Probiotic Product (MISC INTESTINAL FLORA REGULAT) CAPS Take 1 capsule by mouth daily.     Yes Historical Provider, MD  sertraline (ZOLOFT) 50 MG tablet TAKE 1/2 TABLET DAILY FOR FIRST 10 DAYS, THEN INCREASE TO 1 TABLET DAILY. 03/16/15  Yes Hendricks Limes, MD  timolol (TIMOPTIC) 0.5 % ophthalmic solution Place 1 drop into both eyes 2 (two) times daily.    Yes Historical Provider, MD  trimethoprim (TRIMPEX) 100 MG  tablet Take 100 mg by mouth every other day.   Yes Historical Provider, MD  VYTORIN 10-40 MG per tablet TAKE 1 TABLET ONCE DAILY. 10/20/14  Yes Hendricks Limes, MD  DIGOX 125 MCG tablet TAKE 1 TABLET ONCE DAILY. Patient not taking: Reported on 04/06/2015 12/08/14   Minus Breeding, MD    Allergies:   Allergies  Allergen Reactions  . Hydrocodone Nausea And Vomiting  . Desmopressin Acetate     REACTION: swelling feet  . Dorzolamide Hcl Other (See Comments)    Burning in eyes and redness  . Levofloxacin     REACTION: INTERFERS WITH COUMADIN - but no longer taking warfarin  . Ciprofloxacin     REACTION: vaginal infection    Social History:  Ambulatory  independently  Lives at home  With family     reports that she quit smoking about 21 years ago. Her smoking use included Cigarettes. She has a 45 pack-year smoking history. She has never used smokeless tobacco. She reports that she does not drink alcohol or use illicit drugs.    Family History: family history includes Breast cancer in her sister; Cancer in her brother and mother; Colon cancer in her brother; Diabetes in her father; Heart attack in her brother; Leukemia in her mother; Lung cancer in her sister; Ovarian cancer in her sister; Prostate cancer in her brother; Stroke in her father.    Physical Exam: Patient Vitals for the past 24 hrs:  BP Temp Temp src Pulse Resp SpO2  04/08/15 2359 107/67 mmHg - - 90 19 96 %  04/08/15 2330 126/57 mmHg - - - 17 -  04/08/15 2300 127/71 mmHg - - 79 17 90 %  04/08/15 2233 116/73 mmHg - - 90 19 93 %  04/08/15 2100 - - - 84 21 96 %  04/08/15 2046 (!) 125/54 mmHg (!) 96.3 F (35.7 C) Oral 88 16 (!) 68 %    1. General:  in No Acute distress 2. Psychological: Alert but not Oriented to situation, oriented to self and time and place 3. Head/ENT:    Dry Mucous Membranes                          Head bruising over the face bandage over the nose noted neck supple  Poor Dentition 4. SKIN:  decreased Skin turgor,  Skin clean Dry and intact no rash 5. Heart: Regular rate and rhythm loud systolic Murmur, Rub or gallop 6. Lungs:   no wheezes some crackles at the bases   7. Abdomen: Soft, non-tender, Non distended 8. Lower extremities: no clubbing, cyanosis, or edema 9. Neurologically Grossly intact, moving all 4 extremities equally 10. MSK: Normal range of motion  body mass index is unknown because there is no weight on file.   Labs on Admission:   Results for orders placed or performed during the hospital encounter of 04/08/15 (from the past 24 hour(s))  Blood gas, arterial ([facility] ONLY)     Status: Abnormal   Collection Time: 04/08/15  9:21 PM  Result Value Ref Range   FIO2 1.00 %   Delivery systems  NONREBREATHER MASK    pH, Arterial 7.437 7.350 - 7.450   pCO2 arterial 41.3 35.0 - 45.0 mmHg   pO2, Arterial 52.6 (L) 80.0 - 100.0 mmHg   Bicarbonate 27.4 (H) 20.0 - 24.0 mEq/L   TCO2 23.7 0 - 100 mmol/L   Acid-Base Excess 3.3 (H) 0.0 - 2.0 mmol/L   O2 Saturation 87.8 %   Patient temperature 98.6    Collection site RIGHT RADIAL    Drawn by [id number]    Sample type ARTERIAL DRAW    Allens test (pass/fail) PASS PASS  Basic metabolic panel     Status: Abnormal   Collection Time: 04/08/15  9:35 PM  Result Value Ref Range   Sodium 135 135 - 145 mmol/L   Potassium 5.0 3.5 - 5.1 mmol/L   Chloride 97 96 - 112 mmol/L   CO2 31 19 - 32 mmol/L   Glucose, Bld 141 (H) 70 - 99 mg/dL   BUN 16 6 - 23 mg/dL   Creatinine, Ser 0.99 0.50 - 1.10 mg/dL   Calcium 8.7 8.4 - 10.5 mg/dL   GFR calc non Af Amer 50 (L) >90 mL/min   GFR calc Af Amer 58 (L) >90 mL/min   Anion gap 7 5 - 15  Brain natriuretic peptide     Status: Abnormal   Collection Time: 04/08/15  9:35 PM  Result Value Ref Range   B Natriuretic Peptide 539.9 (H) 0.0 - 100.0 pg/mL  CBC with Differential     Status: Abnormal   Collection Time: 04/08/15  9:35 PM  Result Value Ref Range   WBC 7.5 4.0  - 10.5 K/uL   RBC 4.82 3.87 - 5.11 MIL/uL   Hemoglobin 14.4 12.0 - 15.0 g/dL   HCT 44.4 36.0 - 46.0 %   MCV 92.1 78.0 - 100.0 fL   MCH 29.9 26.0 - 34.0 pg   MCHC 32.4 30.0 - 36.0 g/dL   RDW 13.9 11.5 - 15.5 %   Platelets 131 (L) 150 - 400 K/uL   Neutrophils Relative % 78 (H) 43 - 77 %   Neutro Abs 5.9 1.7 - 7.7 K/uL   Lymphocytes Relative 14 12 - 46 %   Lymphs Abs 1.0 0.7 - 4.0 K/uL   Monocytes Relative 7 3 - 12 %   Monocytes Absolute 0.5 0.1 - 1.0 K/uL   Eosinophils Relative 0 0 - 5 %   Eosinophils Absolute 0.0 0.0 - 0.7 K/uL   Basophils Relative 1 0 - 1 %   Basophils Absolute 0.0 0.0 - 0.1 K/uL  I-stat troponin, ED     Status: None   Collection Time: 04/08/15  9:41 PM  Result Value Ref Range   Troponin i, poc 0.02 0.00 - 0.08 ng/mL   Comment 3            UA no UA was obtained  Lab Results  Component Value Date   HGBA1C 6.0 06/20/2009    CrCl cannot be calculated (Unknown ideal weight.).  BNP (last 3 results)  Recent Labs  11/18/14 0341  PROBNP 6363.0*    Other results:  I have pearsonaly reviewed this: ECG REPORT  Rate:85  Rhythm: Appears to be in sinus rhythm  ST&T Change: Diffuse ST depressions minimal unchanged from prior EKG   There were no vitals filed for this visit.   Cultures:    Component Value Date/Time   SDES URINE, CLEAN CATCH 11/10/2014 2312   SPECREQUEST NONE 11/10/2014 2312   CULT NO GROWTH Performed at [facility]  11/10/2014 2312   REPTSTATUS 11/12/2014 FINAL 11/10/2014 2312     Radiological Exams on Admission: Dg Chest 2 View  04/08/2015   CLINICAL DATA:  Patient with altered mental status.  EXAM: CHEST  2 VIEW  COMPARISON:  Chest radiograph 11/25/2014  FINDINGS: Monitoring leads overlie the patient. Stable enlarged cardiac and mediastinal contours. Emphysematous change. Biapical scarring. Interval development of left-greater-than-right mid lung heterogeneous opacities. Additionally there are new consolidative opacities  within the left lower lung. Suggestion of small bilateral pleural effusions.  IMPRESSION: New focal retrocardiac consolidation may represent pneumonia in the appropriate clinical setting. There are small bilateral pleural effusions. Additional heterogeneous opacities within the bilateral mid lungs may represent infection and/or possible edema. Recommend short term radiographic followup.   Electronically Signed   By: Lovey Newcomer M.D.   On: 04/08/2015 22:05   Ct Head Wo Contrast  04/08/2015   CLINICAL DATA:  Patient with altered mental status.  EXAM: CT HEAD WITHOUT CONTRAST  TECHNIQUE: Contiguous axial images were obtained from the base of the skull through the vertex without intravenous contrast.  COMPARISON:  Brain CT 04/06/2015  FINDINGS: Ventricles and sulci are appropriate for patient's age. Re- demonstrated chronic left basal ganglia infarct. No evidence for acute cortically based infarct, intracranial hemorrhage, mass lesion or mass-effect. Periventricular and subcortical white matter hypodensity compatible with chronic small vessel ischemic changes. Bilateral cataract surgery. Paranasal sinuses are well aerated. Mastoid air cells unremarkable. Calvarium is intact.  IMPRESSION: No acute intracranial process.  Chronic small vessel ischemic changes.  Remote lacunar infarct within the left basal ganglia.   Electronically Signed   By: Lovey Newcomer M.D.   On: 04/08/2015 22:11   Ct Angio Chest Pe W/cm &/or Wo Cm  04/08/2015   CLINICAL DATA:  Hypoxia today. History of atrial fibrillation, mild aortic stenosis.  EXAM: CT ANGIOGRAPHY CHEST WITH CONTRAST  TECHNIQUE: Multidetector CT imaging of the chest was performed using the standard protocol during bolus administration of intravenous contrast. Multiplanar CT image reconstructions and MIPs were obtained to evaluate the vascular anatomy.  CONTRAST:  70mL OMNIPAQUE IOHEXOL 350 MG/ML SOLN  COMPARISON:  Chest radiograph April 08, 2015 at 2146 hours  FINDINGS:  Adequate pulmonary arterial contrast opacification for assessment. Main pulmonary artery is not enlarged. No pulmonary arterial filling defect to the subsegmental branches. Heart size is mildly enlarged, accentuated by a mild pectus excavatum. No pericardial fluid collections. Thoracic aorta is normal in course and caliber with moderate to severe calcific atherosclerosis. Prominent mediastinal lymph nodes, measuring up to 12 mm at precarinal level.  Moderate to severe centrilobular emphysema. Patchy consolidation upper lobes bilaterally associated with  small to moderate bilateral pleural effusions. Mildly enhancing atelectasis in lung bases bilaterally with superimposed patchy areas of hypoenhancement. Tracheobronchial tree is patent and midline, no pneumothorax.  Included view of the abdomen is nonsuspicious, splenic granulomas noted. Patient is osteopenic, mild degenerative change of thoracic spine. LEFT humeral arthroplasty.  Review of the MIP images confirms the above findings.  IMPRESSION: No acute pulmonary embolism.  Patchy consolidation consistent with multifocal pneumonia with small bilateral pleural effusions and lower lobe atelectasis. Moderate to severe centrilobular emphysema.  Mild mediastinal lymphadenopathy is likely reactive.  Mild cardiomegaly.   Electronically Signed   By: Elon Alas   On: 04/08/2015 23:13    Chart has been reviewed  Assessment/Plan  81 year old female with history of chronic nocturnal hypoxia and mild requiring 2 L of oxygen 4 to be secondary to chronic aspiration lung scarring as well as mild COPD presents with severe hypoxia after patient to take Percocet if increased confusion and somnolence. CT of the chest consistent with multifocal pneumonia  Present on Admission:  . CAP (community acquired pneumonia) vs . Aspiration pneumonia -  - will admit for IV antibiotics. Call both aspiration as well as atypical pneumonia with combination of Zosyn and azithromycin,  this could be simplified in the future   Obtain sputum cultures, blood cultures if febrile or if decompensates.  Given patient requiring nonrebreather will admit to step down patient . COPD, Gold grade A - mild patient usually takes Combivent as needed provide nebulizer treatments as needed as well as scheduled Atrovent for now   . Atrial fibrillation chronic this is patient's first fall. She denies chronic falls. For now continue chronic anticoagulation. Continue Cardizem and digoxin and check Jackson level oes not wish to be intubated at this point comfortable able to speak in full sentences . Atrial fibrillation chronic this is patient's first fall. She denies chronic falls. For now continue chronic anticoagulation. Continue Cardizem and digoxin and check Jackson level. CT scan showing no evidence of intracranial bleed  Prophylaxis: eliquis, Protonix  CODE STATUS:   DNR/DNI as per patient   Other plan as per orders.  I have spent a total of 65 min on this admission extra Marcello Moores taken to discuss case with PCCM who is aware of the patient  Jensen Beach 04/09/2015, 12:02 AM  [facility]  Pager [phone]   after 2 AM please page floor coverage PA If 7AM-7PM, please contact the day team taking care of the patient  [email or web address]  Password [email or web address]

## 2015-04-09 NOTE — Progress Notes (Signed)
Pt alert x4, v/s stable, no c/o pain, skin dry and intact. Tx from ED, family at the bedside,  Experiencing some prolong ST waves Janit Pagan NP made aware, orders for am labs. Will continue to monitor. Si Gaul Mahario 3:20 AM 04/09/2015.

## 2015-04-09 NOTE — Progress Notes (Signed)
ANTICOAGULATION CONSULT NOTE - Initial Consult  Pharmacy Consult for apixaban Indication: atrial fibrillation  Allergies  Allergen Reactions  . Hydrocodone Nausea And Vomiting  . Desmopressin Acetate     REACTION: swelling feet  . Dorzolamide Hcl Other (See Comments)    Burning in eyes and redness  . Levofloxacin     REACTION: INTERFERS WITH COUMADIN - but no longer taking warfarin  . Ciprofloxacin     REACTION: vaginal infection    Patient Measurements: Height: 5\' 6"  (167.6 cm) Weight: 124 lb 1.9 oz (56.3 kg) IBW/kg (Calculated) : 59.3 Heparin Dosing Weight:   Vital Signs: Temp: 97.7 F (36.5 C) (04/17 0142) Temp Source: Oral (04/17 0142) BP: 107/50 mmHg (04/17 0500) Pulse Rate: 73 (04/17 0307)  Labs:  Recent Labs  04/06/15 1248 04/08/15 2135 04/09/15 0210 04/09/15 0220  HGB 14.4 14.4  --   --   HCT 44.4 44.4  --   --   PLT 175 131*  --   --   LABPROT 15.6*  --   --   --   INR 1.23  --   --   --   CREATININE 0.97 0.99 0.92  --   TROPONINI  --   --   --  0.04*    Estimated Creatinine Clearance: 39 mL/min (by C-G formula based on Cr of 0.92).   Medical History: Past Medical History  Diagnosis Date  . Hx of colonic polyps     last colonoscopy 2005, repeat was due 2010 Dr. Sharlett Iles  . Atrial fibrillation     Dr Percival Spanish  . Fracture 1995, 2009    RUE; LUE Dr. Durward Fortes  . 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 Cystitis   . Complication of anesthesia     difficulty with arousal post op  . Hyperlipidemia   . Hyperlipidemia   . Osteoporosis   . Cerebrovascular accident 01/09  . Hypothyroidism   . Dysrhythmia     Medications:  Prescriptions prior to admission  Medication Sig Dispense Refill Last Dose  . acetaminophen (TYLENOL) 325 MG tablet You can take 2 tablets every 4 hours as needed.  You cannot take more than 4000 mg of tylenol per day.  This is also in your sleep aide, and it is in your prescribed pain medicine. So you need  to figure out.  Do not take more than 12 tablets of tylenol (acetaminophen) per day.   Past Week at Unknown time  . apixaban (ELIQUIS) 2.5 MG TABS tablet Take 1 tablet (2.5 mg total) by mouth 2 (two) times daily. 180 tablet 1 04/08/2015 at am  . B Complex Vitamins (VITAMIN B COMPLEX) TABS Take 1 tablet by mouth daily.    04/08/2015 at Unknown time  . calcium carbonate (TUMS - DOSED IN MG ELEMENTAL CALCIUM) 500 MG chewable tablet Chew 2 tablets by mouth daily as needed for indigestion or heartburn (indigestion).   04/07/2015 at Unknown time  . Calcium Carbonate-Vitamin D (CALCIUM-VITAMIN D) 500-200 MG-UNIT per tablet Take 1 tablet by mouth daily.    04/07/2015 at Unknown time  . Cholecalciferol (VITAMIN D) 2000 UNITS tablet Take 2,000 Units by mouth daily.   04/08/2015 at Unknown time  . Cranberry (CRANBERRY CONCENTRATE) 500 MG CAPS Take 1 capsule by mouth 2 (two) times daily.    04/08/2015 at Unknown time  . digoxin (LANOXIN) 0.125 MG tablet Take 0.125 mg by mouth daily.   04/07/2015 at Unknown time  . diltiazem (  CARDIZEM CD) 120 MG 24 hr capsule TAKE (1) CAPSULE DAILY. 90 capsule 0 04/08/2015 at Unknown time  . diphenhydramine-acetaminophen (TYLENOL PM) 25-500 MG TABS Take 2 tablets by mouth at bedtime as needed (sleep).   Past Week at Unknown time  . ENSURE PLUS (ENSURE PLUS) LIQD Take 1 Can by mouth daily. 24 Can 11 04/08/2015 at Unknown time  . estradiol (ESTRACE) 0.1 MG/GM vaginal cream Place 2 g vaginally 2 (two) times a week. On Wednesdays & Saturdays.   Past Week at Unknown time  . levothyroxine (SYNTHROID, LEVOTHROID) 50 MCG tablet TAKE 1 TABLET ONCE DAILY. 90 tablet 1 04/08/2015 at Unknown time  . LORazepam (ATIVAN) 1 MG tablet TAKE 1 TABLET EVERY 8-12 HOURS AS NEEDED. CAN BE HABIT FORMING & MAY AFFECT MENTAL ALERTNESS. 30 tablet 0 Past Week at Unknown time  . LUMIGAN 0.01 % SOLN Place 1 drop into both eyes at bedtime.   04/07/2015 at Unknown time  . Magnesium Oxide 500 MG TABS Take 1 tablet by mouth  at bedtime.    04/07/2015 at Unknown time  . oxyCODONE-acetaminophen (PERCOCET/ROXICET) 5-325 MG per tablet 1/2-1 tab by mouth every 4 hours when necessary for pain 16 tablet 0 Past Week at Unknown time  . Probiotic Product (MISC INTESTINAL FLORA REGULAT) CAPS Take 1 capsule by mouth daily.     04/08/2015 at Unknown time  . sertraline (ZOLOFT) 50 MG tablet TAKE 1/2 TABLET DAILY FOR FIRST 10 DAYS, THEN INCREASE TO 1 TABLET DAILY. 30 tablet 0 04/08/2015 at Unknown time  . timolol (TIMOPTIC) 0.5 % ophthalmic solution Place 1 drop into both eyes 2 (two) times daily.    04/08/2015 at Unknown time  . trimethoprim (TRIMPEX) 100 MG tablet Take 100 mg by mouth every other day.   Past Week at unsure due to patient confusion  . VYTORIN 10-40 MG per tablet TAKE 1 TABLET ONCE DAILY. 90 tablet 1 04/08/2015 at Unknown time  . DIGOX 125 MCG tablet TAKE 1 TABLET ONCE DAILY. (Patient not taking: Reported on 04/06/2015) 90 tablet 1 Not Taking at Unknown time   Scheduled:  . apixaban  2.5 mg Oral BID  . azithromycin  500 mg Intravenous Q24H  . digoxin  0.125 mg Oral Daily  . diltiazem  120 mg Oral Daily  . FLORA-Q  1 capsule Oral Daily  . guaiFENesin  600 mg Oral BID  . ipratropium-albuterol  3 mL Nebulization TID  . latanoprost  1 drop Both Eyes QHS  . levothyroxine  50 mcg Oral QAC breakfast  . piperacillin-tazobactam (ZOSYN)  IV  3.375 g Intravenous 3 times per day  . sertraline  50 mg Oral Daily  . timolol  1 drop Both Eyes BID  . trimethoprim  100 mg Oral Q48H    Assessment: Patient on apixaban prior to admission for afib.  Patient is > 80yo and < 60kg, so 2.5mg  bid is required per dosing guidelines.  Goal of Therapy:  apixaban dosed based on patient weight and renal function     Plan:  Apixaban 2.5mg  po bid  Nani Skillern Crowford 04/09/2015,7:03 AM

## 2015-04-09 NOTE — Evaluation (Signed)
Clinical/Bedside Swallow Evaluation Patient Details  Name: EATHEL PAJAK MRN: [id number] Date of Birth: Jul 12, 1927  Today's Date: 04/09/2015 Time: SLP Start Time (ACUTE ONLY): 10 SLP Stop Time (ACUTE ONLY): 1542 SLP Time Calculation (min) (ACUTE ONLY): 19 min  Past Medical History:  Past Medical History  Diagnosis Date  . Hx of colonic polyps     last colonoscopy 2005, repeat was due 2010 Dr. Sharlett Iles  . Atrial fibrillation     Dr Percival Spanish  . Fracture 1995, 2009    RUE; LUE Dr. Durward Fortes  . 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 Cystitis   . Complication of anesthesia     difficulty with arousal post op  . Hyperlipidemia   . Hyperlipidemia   . Osteoporosis   . Cerebrovascular accident 01/09  . Hypothyroidism   . Dysrhythmia    Past Surgical History:  Past Surgical History  Procedure Laterality Date  . Tonsillectomy and adenoidectomy    . Vocal cord polyps    . Thyroid needle biopsy  2002  . Appendectomy    . Cataract extraction      Bilat  . Colonoscopy w/ polypectomy  2005    Diverticulosis;Dr. Sharlett Iles  . Total hip arthroplasty  2008    CHF & RAF post op   . Total shoulder replacement  2010    Dr Durward Fortes - left shoulder  . Mohs surgery  2013    nasal Basal Cell; Dr Sarajane Jews  . Excision metacarpal mass Right 08/17/2013    Procedure: RIGHT LONG EXCISION MASS AND DIP JOINT DEBRIDEMENT;  Surgeon: Tennis Must, MD;  Location: [place];  Service: Orthopedics;  Laterality: Right;  . Femoral hernia repair Right 08/17/2014    Procedure: OPEN REPAIR RIGHT FEMORAL HERNIA  WITH INSERTION OF MESH;  Surgeon: Adin Hector, MD;  Location: [facility];  Service: General;  Laterality: Right;  . Insertion of mesh  08/17/2014    Procedure: INSERTION OF MESH;  Surgeon: Adin Hector, MD;  Location: [facility];  Service: General;;  . Hernia repair    . Inguinal hernia repair Right 11/15/2014    Procedure: LAPAROSCOPIC RIGHT INGUINAL HERNIA WITH  MESH;  Surgeon: Alphonsa Overall, MD;  Location: [facility];  Service: General;  Laterality: Right;   HPI:  81 y.o. female past medical history of colonic polyps; Atrial fibrillation, Blindness; Cystitis, Hyperlipidemia, Cerebrovascular accident (01/09); Hypothyroidism, history of chronic aspiration followed by pulmonology (esophageal dysphagia- see below). Per MD documentation pt has chronic cough worse with swallowing and chronic cough likely secondary to chronic aspiration lung scarring. Recent fall, seen in emergency department on the 14th with nasal bridge fracture. Patient lethargic, decreased appetite, hypoxia based on home monitor down to 49s.  CT No acute intracranial process. Remote lacunar infarct within the left basal ganglia. CXR patchy consolidation consistent with multifocal pneumonia with small bilateral pleural effusions and lower lobe atelectasis. MBS 03/13/15 mild pharyngeal dysphagia with delayed swallow initiaton due to mildly decreased sensation and mild-moderate (moderate in pyriform sinuses. One episode of flash laryngeal penetration with cup sip thin (toward end of study). Esophagus scanned showing barium residual in distal esophagus with delayed clearance. MBS 06/07/14 recommended regular diet texture and thin liquids. Esophagram 05/2014 revealed severe dysmotility with disruption of primary esophageal peristaltic waves extensive tertiary contractions. Mild distal esophageal narrowing. This may be related to stricture from reflux, but recommend direct visualization to exclude other forms of stricture such as malignancy.  Assessment / Plan / Recommendation Clinical Impression  SLP familiar with pt from outpatient MBS performed 03/13/15. No oropharyngeal indications of decreased airway protection, delayed swallow or residual. SpO2 dropped to 87% x 2 without increased work of breathing. Pt reports she "never feels short of breath." Pt has history of esophageal severe dysmotility, therefore  pt/family re-educated on esophageal precautions to mitigate chance of aspiration from esophageal source. Recommend regular texture and thin liquids, straws allowed, pills with thin liquids, remain upright minimum one hour post meals and alternate liquids and solids.      Aspiration Risk  Moderate    Diet Recommendation Regular;Thin liquid   Liquid Administration via: Cup;Straw Medication Administration: Whole meds with liquid Supervision: Patient able to self feed;Intermittent supervision to cue for compensatory strategies Compensations: Slow rate;Small sips/bites;Follow solids with liquid Postural Changes and/or Swallow Maneuvers: Seated upright 90 degrees;Upright 30-60 min after meal    Other  Recommendations Oral Care Recommendations: Oral care BID   Follow Up Recommendations  None    Frequency and Duration        Pertinent Vitals/Pain none         Swallow Study           Oral/Motor/Sensory Function Overall Oral Motor/Sensory Function: Appears within functional limits for tasks assessed   Ice Chips Ice chips: Not tested   Thin Liquid Thin Liquid: Within functional limits Presentation: Cup;Straw    Nectar Thick Nectar Thick Liquid: Not tested   Honey Thick Honey Thick Liquid: Not tested   Puree Puree: Within functional limits   Solid   GO    Solid: Within functional limits       Houston Siren 04/09/2015,4:38 PM  Orbie Pyo Colvin Caroli.Ed [employer] [phone]

## 2015-04-09 NOTE — Progress Notes (Signed)
ANTIBIOTIC CONSULT NOTE - INITIAL  Pharmacy Consult for Pharmacy may adjust antibiotic dose based on patient's renal function. Indication: pneumonia  Allergies  Allergen Reactions  . Hydrocodone Nausea And Vomiting  . Desmopressin Acetate     REACTION: swelling feet  . Dorzolamide Hcl Other (See Comments)    Burning in eyes and redness  . Levofloxacin     REACTION: INTERFERS WITH COUMADIN - but no longer taking warfarin  . Ciprofloxacin     REACTION: vaginal infection    Patient Measurements: Height: 5\' 6"  (167.6 cm) Weight: 124 lb 1.9 oz (56.3 kg) IBW/kg (Calculated) : 59.3 Adjusted Body Weight:   Vital Signs: Temp: 97.7 F (36.5 C) (04/17 0142) Temp Source: Oral (04/17 0142) BP: 107/50 mmHg (04/17 0500) Pulse Rate: 73 (04/17 0307) Intake/Output from previous day: 04/16 0701 - 04/17 0700 In: 320 [I.V.:320] Out: 400 [Urine:400] Intake/Output from this shift:    Labs:  Recent Labs  04/06/15 1248 04/08/15 2135 04/09/15 0210  WBC 5.5 7.5  --   HGB 14.4 14.4  --   PLT 175 131*  --   CREATININE 0.97 0.99 0.92   Estimated Creatinine Clearance: 39 mL/min (by C-G formula based on Cr of 0.92). No results for input(s): VANCOTROUGH, VANCOPEAK, VANCORANDOM, GENTTROUGH, GENTPEAK, GENTRANDOM, TOBRATROUGH, TOBRAPEAK, TOBRARND, AMIKACINPEAK, AMIKACINTROU, AMIKACIN in the last 72 hours.   Microbiology: Recent Results (from the past 720 hour(s))  MRSA PCR Screening     Status: None   Collection Time: 04/09/15  1:36 AM  Result Value Ref Range Status   MRSA by PCR NEGATIVE NEGATIVE Final    Comment:        The GeneXpert MRSA Assay (FDA approved for NASAL specimens only), is one component of a comprehensive MRSA colonization surveillance program. It is not intended to diagnose MRSA infection nor to guide or monitor treatment for MRSA infections.     Medical History: Past Medical History  Diagnosis Date  . Hx of colonic polyps     last colonoscopy 2005, repeat was  due 2010 Dr. Sharlett Iles  . Atrial fibrillation     Dr Percival Spanish  . Fracture 1995, 2009    RUE; LUE Dr. Durward Fortes  . 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 Cystitis   . Complication of anesthesia     difficulty with arousal post op  . Hyperlipidemia   . Hyperlipidemia   . Osteoporosis   . Cerebrovascular accident 01/09  . Hypothyroidism   . Dysrhythmia     Medications:  Anti-infectives    Start     Dose/Rate Route Frequency Ordered Stop   04/09/15 2200  azithromycin (ZITHROMAX) 500 mg in dextrose 5 % 250 mL IVPB     500 mg 250 mL/hr over 60 Minutes Intravenous Every 24 hours 04/09/15 0139     04/09/15 1000  trimethoprim (TRIMPEX) tablet 100 mg     100 mg Oral Every 48 hours 04/09/15 0140     04/09/15 0145  piperacillin-tazobactam (ZOSYN) IVPB 3.375 g     3.375 g 12.5 mL/hr over 240 Minutes Intravenous 3 times per day 04/09/15 0140     04/08/15 2345  cefTRIAXone (ROCEPHIN) 1 g in dextrose 5 % 50 mL IVPB     1 g 100 mL/hr over 30 Minutes Intravenous  Once 04/08/15 2336 04/09/15 0028   04/08/15 2345  azithromycin (ZITHROMAX) 500 mg in dextrose 5 % 250 mL IVPB  Status:  Discontinued     500 mg 250  mL/hr over 60 Minutes Intravenous  Once 04/08/15 2336 04/09/15 0140     Assessment: Patient with PNA.  Patient with reduced renal function.    Goal of Therapy:  antibiotics dosed based on patient weight and renal function   Plan:  Follow up culture results  No changes to antibiotics at this time.  Tyler Deis, Shea Stakes Crowford 04/09/2015,7:05 AM

## 2015-04-10 DIAGNOSIS — J438 Other emphysema: Secondary | ICD-10-CM

## 2015-04-10 DIAGNOSIS — J189 Pneumonia, unspecified organism: Secondary | ICD-10-CM

## 2015-04-10 DIAGNOSIS — I4891 Unspecified atrial fibrillation: Secondary | ICD-10-CM

## 2015-04-10 LAB — LEGIONELLA ANTIGEN, URINE

## 2015-04-10 LAB — BASIC METABOLIC PANEL
ANION GAP: 8 (ref 5–15)
BUN: 16 mg/dL (ref 6–23)
CALCIUM: 8.1 mg/dL — AB (ref 8.4–10.5)
CO2: 30 mmol/L (ref 19–32)
CREATININE: 0.91 mg/dL (ref 0.50–1.10)
Chloride: 95 mmol/L — ABNORMAL LOW (ref 96–112)
GFR, EST AFRICAN AMERICAN: 64 mL/min — AB (ref >90)
GFR, EST NON AFRICAN AMERICAN: 56 mL/min — AB (ref >90)
Glucose, Bld: 100 mg/dL — ABNORMAL HIGH (ref 70–99)
Potassium: 4.3 mmol/L (ref 3.5–5.1)
Sodium: 133 mmol/L — ABNORMAL LOW (ref 135–145)

## 2015-04-10 LAB — CBC
HEMATOCRIT: 40.3 % (ref 36.0–46.0)
Hemoglobin: 13 g/dL (ref 12.0–15.0)
MCH: 29.7 pg (ref 26.0–34.0)
MCHC: 32.3 g/dL (ref 30.0–36.0)
MCV: 92.2 fL (ref 78.0–100.0)
Platelets: 142 10*3/uL — ABNORMAL LOW (ref 150–400)
RBC: 4.37 MIL/uL (ref 3.87–5.11)
RDW: 14 % (ref 11.5–15.5)
WBC: 7 10*3/uL (ref 4.0–10.5)

## 2015-04-10 LAB — STREP PNEUMONIAE URINARY ANTIGEN: STREP PNEUMO URINARY ANTIGEN: NEGATIVE

## 2015-04-10 MED ORDER — SERTRALINE HCL 25 MG PO TABS
25.0000 mg | ORAL_TABLET | Freq: Every day | ORAL | Status: DC
  Administered 2015-04-11 – 2015-04-14 (×4): 25 mg via ORAL
  Filled 2015-04-10 (×4): qty 1

## 2015-04-10 NOTE — Progress Notes (Signed)
[facility]  My personal office patient in with pneumonia. Images from CT and CXR reviewed.  Looks like recurrent aspiration pneumonia to me.  Oxygenation improving.  Agree with current management.  Would keep SLP involved.  Patient and daughter updated bedside.  Roselie Awkward, MD [facility] Pager: [phone] Cell: [phone] If no response, call [phone]

## 2015-04-10 NOTE — Progress Notes (Signed)
Patient Demographics  Kristine Simmons, is a 81 y.o. female, DOB - May 21, 1927, BWI:[id number]  Admit date - 04/08/2015   Admitting Physician Toy Baker, MD  Outpatient Primary MD for the patient is Unice Cobble, MD  LOS - 2   Chief Complaint  Patient presents with  . Altered Mental Status  . hypoxia       Admission history of present illness/brief narrative: Kristine Simmons is a 81 y.o. female with  past medical history of colonic polyps; Atrial fibrillation; Blindness; Cystitis; Complication of anesthesia; Hyperlipidemia; Hyperlipidemia; Osteoporosis; Cerebrovascular accident (01/09); Hypothyroidism; and Dysrhythmia.  Patient with history of chronic aspiration followed by pulmonology for chronic hypoxia needing oxygen at night. She has chronic cough worse with swallowing. She has history of chronic cough likely secondary to chronic aspiration lung scarring. Patient has pulse oximetry at home. Per husband she had a recent fall while working at the yard she lost her balance and fell hitting her face. She was seen in the emergency department on the 14th and was noted to have nasal bridge fracture.Agent presents with hypoxia, CT Angela chest showing evidence of multilevel pulmonary pneumonia, no evidence of PE, hospitalist requested to admit the patient.  Subjective:   Kristine Simmons today has, No headache, No chest pain, No abdominal pain - No Nausea, No new weakness tingling or numbness, reports cough and shortness of breath.  Assessment & Plan    Active Problems:   Atrial fibrillation   COPD, Gold grade A   Cough   CAP (community acquired pneumonia)   Aspiration pneumonia  Community-acquired pneumonia versus aspiration pneumonia - Patient has history of chronic aspiration, cell PE evaluation appreciated, continue with regular diet -  continue with IV  Zosyn and azithromycin. - Follow on blood culture: No growth to date - Follow on sputum culture.  COPD - At this point patient has no wheezing, so no indication for steroids, we'll continue with nebulizer treatment.  Atrial fibrillation - Heart rate controlled on Cardizem and digoxin - Patient is on Eliquis for anticoagulation, patient had one fall in so many years, for now continue with anticoagulation. - ChadVAsc >2.  Hypothyroidism - Continue with Synthroid  Code Status: DO NOT RESUSCITATE  Family Communication: Spoke with daughter at bedside yesterday  Disposition Plan: Remains in stepdown   Procedures  None   Consults   None   Medications  Scheduled Meds: . apixaban  2.5 mg Oral BID  . azithromycin  500 mg Intravenous Q24H  . digoxin  0.125 mg Oral Daily  . diltiazem  120 mg Oral Daily  . FLORA-Q  1 capsule Oral Daily  . guaiFENesin  600 mg Oral BID  . ipratropium-albuterol  3 mL Nebulization TID  . latanoprost  1 drop Both Eyes QHS  . levothyroxine  50 mcg Oral QAC breakfast  . piperacillin-tazobactam (ZOSYN)  IV  3.375 g Intravenous 3 times per day  . polyethylene glycol  17 g Oral Daily  . sertraline  50 mg Oral Daily  . timolol  1 drop Both Eyes BID  . trimethoprim  100 mg Oral Q48H   Continuous Infusions:  PRN Meds:.albuterol, lip balm, LORazepam  DVT Prophylaxis  on Eliquis  Lab Results  Component Value Date  PLT 142* 04/10/2015    Antibiotics    Anti-infectives    Start     Dose/Rate Route Frequency Ordered Stop   04/09/15 2200  azithromycin (ZITHROMAX) 500 mg in dextrose 5 % 250 mL IVPB     500 mg 250 mL/hr over 60 Minutes Intravenous Every 24 hours 04/09/15 0139     04/09/15 1000  trimethoprim (TRIMPEX) tablet 100 mg     100 mg Oral Every 48 hours 04/09/15 0140     04/09/15 0145  piperacillin-tazobactam (ZOSYN) IVPB 3.375 g     3.375 g 12.5 mL/hr over 240 Minutes Intravenous 3 times per day 04/09/15 0140     04/08/15 2345   cefTRIAXone (ROCEPHIN) 1 g in dextrose 5 % 50 mL IVPB     1 g 100 mL/hr over 30 Minutes Intravenous  Once 04/08/15 2336 04/09/15 0028   04/08/15 2345  azithromycin (ZITHROMAX) 500 mg in dextrose 5 % 250 mL IVPB  Status:  Discontinued     500 mg 250 mL/hr over 60 Minutes Intravenous  Once 04/08/15 2336 04/09/15 0140          Objective:   Filed Vitals:   04/10/15 0800 04/10/15 1000 04/10/15 1056 04/10/15 1200  BP: 140/73 106/48  115/76  Pulse: 67 88  72  Temp: 97.3 F (36.3 C)     TempSrc: Oral     Resp: 16 21  21   Height:      Weight:      SpO2: 91% 88% 93% 90%    Wt Readings from Last 3 Encounters:  04/10/15 58.1 kg (128 lb 1.4 oz)  03/07/15 57.607 kg (127 lb)  01/02/15 56.246 kg (124 lb)     Intake/Output Summary (Last 24 hours) at 04/10/15 1508 Last data filed at 04/10/15 1200  Gross per 24 hour  Intake   1010 ml  Output    955 ml  Net     55 ml     Physical Exam  Awake Alert, Oriented, No new F.N deficits, Normal affect Patient had bruising on the face area, around the nose, and periorbital area. Supple Neck,No JVD, No cervical lymphadenopathy appriciated.  Symmetrical Chest wall movement, Good air movement bilaterally, no wheezing RRR ,No Gallops,Rubs, significant for systolic murmur No Parasternal Heave +ve B.Sounds, Abd Soft, No tenderness, No organomegaly appriciated, No rebound - guarding or rigidity. No Cyanosis, Clubbing or edema,   Data Review   Micro Results Recent Results (from the past 240 hour(s))  MRSA PCR Screening     Status: None   Collection Time: 04/09/15  1:36 AM  Result Value Ref Range Status   MRSA by PCR NEGATIVE NEGATIVE Final    Comment:        The GeneXpert MRSA Assay (FDA approved for NASAL specimens only), is one component of a comprehensive MRSA colonization surveillance program. It is not intended to diagnose MRSA infection nor to guide or monitor treatment for MRSA infections.   Culture, blood (routine x 2) Call  MD if unable to obtain prior to antibiotics being given     Status: None (Preliminary result)   Collection Time: 04/09/15  2:10 AM  Result Value Ref Range Status   Specimen Description BLOOD LEFT ARM  Final   Special Requests   Final    BOTTLES DRAWN AEROBIC AND ANAEROBIC 10CC AER, 6CC ANA   Culture   Final           BLOOD CULTURE RECEIVED NO GROWTH TO DATE CULTURE WILL  BE HELD FOR 5 DAYS BEFORE ISSUING A FINAL NEGATIVE REPORT Performed at [facility]    Report Status PENDING  Incomplete  Culture, blood (routine x 2) Call MD if unable to obtain prior to antibiotics being given     Status: None (Preliminary result)   Collection Time: 04/09/15  2:20 AM  Result Value Ref Range Status   Specimen Description BLOOD LEFT HAND  Final   Special Requests BOTTLES DRAWN AEROBIC ONLY [place]  Final   Culture   Final           BLOOD CULTURE RECEIVED NO GROWTH TO DATE CULTURE WILL BE HELD FOR 5 DAYS BEFORE ISSUING A FINAL NEGATIVE REPORT Performed at [facility]    Report Status PENDING  Incomplete    Radiology Reports Dg Chest 2 View  04/08/2015   CLINICAL DATA:  Patient with altered mental status.  EXAM: CHEST  2 VIEW  COMPARISON:  Chest radiograph 11/25/2014  FINDINGS: Monitoring leads overlie the patient. Stable enlarged cardiac and mediastinal contours. Emphysematous change. Biapical scarring. Interval development of left-greater-than-right mid lung heterogeneous opacities. Additionally there are new consolidative opacities within the left lower lung. Suggestion of small bilateral pleural effusions.  IMPRESSION: New focal retrocardiac consolidation may represent pneumonia in the appropriate clinical setting. There are small bilateral pleural effusions. Additional heterogeneous opacities within the bilateral mid lungs may represent infection and/or possible edema. Recommend short term radiographic followup.   Electronically Signed   By: Lovey Newcomer M.D.   On: 04/08/2015 22:05   Ct Head  Wo Contrast  04/08/2015   CLINICAL DATA:  Patient with altered mental status.  EXAM: CT HEAD WITHOUT CONTRAST  TECHNIQUE: Contiguous axial images were obtained from the base of the skull through the vertex without intravenous contrast.  COMPARISON:  Brain CT 04/06/2015  FINDINGS: Ventricles and sulci are appropriate for patient's age. Re- demonstrated chronic left basal ganglia infarct. No evidence for acute cortically based infarct, intracranial hemorrhage, mass lesion or mass-effect. Periventricular and subcortical white matter hypodensity compatible with chronic small vessel ischemic changes. Bilateral cataract surgery. Paranasal sinuses are well aerated. Mastoid air cells unremarkable. Calvarium is intact.  IMPRESSION: No acute intracranial process.  Chronic small vessel ischemic changes.  Remote lacunar infarct within the left basal ganglia.   Electronically Signed   By: Lovey Newcomer M.D.   On: 04/08/2015 22:11   Ct Angio Chest Pe W/cm &/or Wo Cm  04/08/2015   CLINICAL DATA:  Hypoxia today. History of atrial fibrillation, mild aortic stenosis.  EXAM: CT ANGIOGRAPHY CHEST WITH CONTRAST  TECHNIQUE: Multidetector CT imaging of the chest was performed using the standard protocol during bolus administration of intravenous contrast. Multiplanar CT image reconstructions and MIPs were obtained to evaluate the vascular anatomy.  CONTRAST:  32mL OMNIPAQUE IOHEXOL 350 MG/ML SOLN  COMPARISON:  Chest radiograph April 08, 2015 at 2146 hours  FINDINGS: Adequate pulmonary arterial contrast opacification for assessment. Main pulmonary artery is not enlarged. No pulmonary arterial filling defect to the subsegmental branches. Heart size is mildly enlarged, accentuated by a mild pectus excavatum. No pericardial fluid collections. Thoracic aorta is normal in course and caliber with moderate to severe calcific atherosclerosis. Prominent mediastinal lymph nodes, measuring up to 12 mm at precarinal level.  Moderate to severe  centrilobular emphysema. Patchy consolidation upper lobes bilaterally associated with small to moderate bilateral pleural effusions. Mildly enhancing atelectasis in lung bases bilaterally with superimposed patchy areas of hypoenhancement. Tracheobronchial tree is patent and midline, no pneumothorax.  Included view of the abdomen is nonsuspicious, splenic granulomas noted. Patient is osteopenic, mild degenerative change of thoracic spine. LEFT humeral arthroplasty.  Review of the MIP images confirms the above findings.  IMPRESSION: No acute pulmonary embolism.  Patchy consolidation consistent with multifocal pneumonia with small bilateral pleural effusions and lower lobe atelectasis. Moderate to severe centrilobular emphysema.  Mild mediastinal lymphadenopathy is likely reactive.  Mild cardiomegaly.   Electronically Signed   By: Elon Alas   On: 04/08/2015 23:13    CBC  Recent Labs Lab 04/06/15 1248 04/08/15 2135 04/10/15 0352  WBC 5.5 7.5 7.0  HGB 14.4 14.4 13.0  HCT 44.4 44.4 40.3  PLT 175 131* 142*  MCV 89.9 92.1 92.2  MCH 29.1 29.9 29.7  MCHC 32.4 32.4 32.3  RDW 13.9 13.9 14.0  LYMPHSABS 1.6 1.0  --   MONOABS 0.5 0.5  --   EOSABS 0.1 0.0  --   BASOSABS 0.1 0.0  --     Chemistries   Recent Labs Lab 04/06/15 1248 04/08/15 2135 04/09/15 0210 04/10/15 0352  NA 136 135 137 133*  K 4.6 5.0 4.7 4.3  CL 100 97 98 95*  CO2 28 31 32 30  GLUCOSE 93 141* 124* 100*  BUN 15 16 16 16   CREATININE 0.97 0.99 0.92 0.91  CALCIUM 9.1 8.7 8.5 8.1*  AST  --   --  154*  --   ALT  --   --  137*  --   ALKPHOS  --   --  47  --   BILITOT  --   --  0.6  --    ------------------------------------------------------------------------------------------------------------------ estimated creatinine clearance is 40.7 mL/min (by C-G formula based on Cr of 0.91). ------------------------------------------------------------------------------------------------------------------ No results for  input(s): HGBA1C in the last 72 hours. ------------------------------------------------------------------------------------------------------------------ No results for input(s): CHOL, HDL, LDLCALC, TRIG, CHOLHDL, LDLDIRECT in the last 72 hours. ------------------------------------------------------------------------------------------------------------------ No results for input(s): TSH, T4TOTAL, T3FREE, THYROIDAB in the last 72 hours.  Invalid input(s): FREET3 ------------------------------------------------------------------------------------------------------------------ No results for input(s): VITAMINB12, FOLATE, FERRITIN, TIBC, IRON, RETICCTPCT in the last 72 hours.  Coagulation profile  Recent Labs Lab 04/06/15 1248  INR 1.23    No results for input(s): DDIMER in the last 72 hours.  Cardiac Enzymes  Recent Labs Lab 04/09/15 0220 04/09/15 0924 04/09/15 1515  TROPONINI 0.04* 0.03 0.03   ------------------------------------------------------------------------------------------------------------------ Invalid input(s): POCBNP     Time Spent in minutes   30 minutes   Kristine Simmons M.D on 04/10/2015 at 3:08 PM  Between 7am to 7pm - Pager - [phone]  After 7pm go to [email or web address] - password [email or web address]  And look for the night coverage person covering for me after hours  [facility] Office  [phone]   **Disclaimer: This note may have been dictated with voice recognition software. Similar sounding words can inadvertently be transcribed and this note may contain transcription errors which may not have been corrected upon publication of note.**

## 2015-04-10 NOTE — Progress Notes (Signed)
CARE MANAGEMENT NOTE 04/10/2015  Patient:  Kristine Simmons, Kristine Simmons   Account Number:  [id number]  Date Initiated:  04/10/2015  Documentation initiated by:  Jossette Zirbel  Subjective/Objective Assessment:   hypoxia     Action/Plan:   home when stable   Anticipated DC Date:  04/13/2015   Anticipated DC Plan:  HOME/SELF CARE  In-house referral  NA      DC Planning Services  CM consult      PAC Choice  NA   Choice offered to / List presented to:             Status of service:  In process, will continue to follow Medicare Important Message given?   (If response is "NO", the following Medicare IM given date fields will be blank) Date Medicare IM given:   Medicare IM given by:   Date Additional Medicare IM given:   Additional Medicare IM given by:    Discharge Disposition:    Per UR Regulation:  Reviewed for med. necessity/level of care/duration of stay  If discussed at [place] of Stay Meetings, dates discussed:    Comments:  [id number] [place]ridge Dace, [place], [place] Chart Reviewed. Discharge needs at time of review: None present will follow for needs. Chart note for progression:

## 2015-04-11 ENCOUNTER — Encounter: Payer: Self-pay | Admitting: Pulmonary Disease

## 2015-04-11 LAB — BASIC METABOLIC PANEL
Anion gap: 7 (ref 5–15)
BUN: 13 mg/dL (ref 6–23)
CO2: 33 mmol/L — AB (ref 19–32)
CREATININE: 0.88 mg/dL (ref 0.50–1.10)
Calcium: 8.4 mg/dL (ref 8.4–10.5)
Chloride: 99 mmol/L (ref 96–112)
GFR calc Af Amer: 67 mL/min — ABNORMAL LOW (ref >90)
GFR calc non Af Amer: 58 mL/min — ABNORMAL LOW (ref >90)
GLUCOSE: 89 mg/dL (ref 70–99)
Potassium: 4.3 mmol/L (ref 3.5–5.1)
Sodium: 139 mmol/L (ref 135–145)

## 2015-04-11 LAB — CBC
HCT: 41.2 % (ref 36.0–46.0)
HEMOGLOBIN: 13.4 g/dL (ref 12.0–15.0)
MCH: 29.7 pg (ref 26.0–34.0)
MCHC: 32.5 g/dL (ref 30.0–36.0)
MCV: 91.4 fL (ref 78.0–100.0)
Platelets: 156 10*3/uL (ref 150–400)
RBC: 4.51 MIL/uL (ref 3.87–5.11)
RDW: 13.9 % (ref 11.5–15.5)
WBC: 6.6 10*3/uL (ref 4.0–10.5)

## 2015-04-11 MED ORDER — IPRATROPIUM-ALBUTEROL 0.5-2.5 (3) MG/3ML IN SOLN: 3.0000 mL | Freq: Four times a day (QID) | RESPIRATORY_TRACT | Status: DC | PRN

## 2015-04-11 NOTE — Clinical Documentation Improvement (Signed)
Presents with likely Aspiration Pneumonia; hypoxia is documented.   Patient with respiratory rate of 17 to 28  Sats ranging from 84 to 94%   Placed in 5 to 6L FiO2  Please provide a diagnosis associated with the above clinical indicators and treatment provided and document findings in next progress note and include in discharge summary if applicable.  Acute Respiratory Failure Acute on Chronic Respiratory Failure Chronic Respiratory Failure Acute Respiratory Distress Other Condition Cannot Clinically Determine   Thank You, Zoila Shutter ,RN Clinical Documentation Specialist:  [phone]  [place] Information Management

## 2015-04-11 NOTE — Progress Notes (Signed)
Patient Demographics  Kristine Simmons, is a 81 y.o. female, DOB - 01-Apr-1927, NID:[id number]  Admit date - 04/08/2015   Admitting Physician Toy Baker, MD  Outpatient Primary MD for the patient is Unice Cobble, MD  LOS - 3   Chief Complaint  Patient presents with  . Altered Mental Status  . hypoxia       Admission history of present illness/brief narrative: Kristine Simmons is a 81 y.o. female with  past medical history of colonic polyps; Atrial fibrillation; Blindness; Cystitis; Complication of anesthesia; Hyperlipidemia; Hyperlipidemia; Osteoporosis; Cerebrovascular accident (01/09); Hypothyroidism; and Dysrhythmia.  Patient with history of chronic aspiration followed by pulmonology for chronic hypoxia needing oxygen at night. She has chronic cough worse with swallowing. She has history of chronic cough likely secondary to chronic aspiration lung scarring. Patient has pulse oximetry at home. Per husband she had a recent fall while working at the yard she lost her balance and fell hitting her face. She was seen in the emergency department on the 14th and was noted to have nasal bridge fracture.Agent presents with hypoxia, CT Angio chest showing evidence of multilevel pulmonary pneumonia, no evidence of Simmons, hospitalist requested to admit the patient. - Agent was started on Zosyn and azithromycin for aspiration pneumonia, she passed a swallow evaluation,  Subjective:   Kristine Simmons today has, No headache, No chest pain, No abdominal pain - No Nausea, No new weakness tingling or numbness, reports cough and shortness of breath are improving.  Assessment & Plan    Active Problems:   Atrial fibrillation   COPD, Gold grade A   Cough   CAP (community acquired pneumonia)   Aspiration pneumonia  Community-acquired pneumonia versus aspiration pneumonia -  Patient has history of chronic aspiration, SLP evaluation appreciated, continue with regular diet -  continue with IV Zosyn and azithromycin 4/17 -  blood culture: No growth to date -  Follow on sputum culture. - Patient is improving, will transfer from stepdown to telemetry floor.  COPD - At this point patient has no wheezing, so no indication for steroids, we'll continue with nebulizer treatment.  Acute on chronic hypoxic respiratory failure - At baseline patient is on 2 L nasal cannula at bedtime, still requiring oxygen daytime, will continue treatment for pneumonia, wean,  oxygen as tolerated.  Atrial fibrillation - Heart rate controlled on Cardizem and digoxin - Patient is on Eliquis for anticoagulation, patient had one fall in so many years, for now continue with anticoagulation. - ChadVAsc >2.  Hypothyroidism - Continue with Synthroid  Code Status: DO NOT RESUSCITATE  Family Communication: Spoke with daughter and husband at bedside yesterday  Disposition Plan: Transfer to telemetry floor, PT consulted   Procedures  None   Consults   None   Medications  Scheduled Meds: . apixaban  2.5 mg Oral BID  . azithromycin  500 mg Intravenous Q24H  . digoxin  0.125 mg Oral Daily  . diltiazem  120 mg Oral Daily  . FLORA-Q  1 capsule Oral Daily  . guaiFENesin  600 mg Oral BID  . latanoprost  1 drop Both Eyes QHS  . levothyroxine  50 mcg Oral QAC breakfast  . piperacillin-tazobactam (ZOSYN)  IV  3.375 g Intravenous 3 times per day  .  polyethylene glycol  17 g Oral Daily  . sertraline  25 mg Oral Daily  . timolol  1 drop Both Eyes BID  . trimethoprim  100 mg Oral Q48H   Continuous Infusions:  PRN Meds:.albuterol, ipratropium-albuterol, lip balm, LORazepam  DVT Prophylaxis  on Eliquis  Lab Results  Component Value Date   PLT 156 04/11/2015    Antibiotics    Anti-infectives    Start     Dose/Rate Route Frequency Ordered Stop   04/09/15 2200  azithromycin  (ZITHROMAX) 500 mg in dextrose 5 % 250 mL IVPB     500 mg 250 mL/hr over 60 Minutes Intravenous Every 24 hours 04/09/15 0139     04/09/15 1000  trimethoprim (TRIMPEX) tablet 100 mg     100 mg Oral Every 48 hours 04/09/15 0140     04/09/15 0145  piperacillin-tazobactam (ZOSYN) IVPB 3.375 g     3.375 g 12.5 mL/hr over 240 Minutes Intravenous 3 times per day 04/09/15 0140     04/08/15 2345  cefTRIAXone (ROCEPHIN) 1 g in dextrose 5 % 50 mL IVPB     1 g 100 mL/hr over 30 Minutes Intravenous  Once 04/08/15 2336 04/09/15 0028   04/08/15 2345  azithromycin (ZITHROMAX) 500 mg in dextrose 5 % 250 mL IVPB  Status:  Discontinued     500 mg 250 mL/hr over 60 Minutes Intravenous  Once 04/08/15 2336 04/09/15 0140          Objective:   Filed Vitals:   04/11/15 0600 04/11/15 0700 04/11/15 0800 04/11/15 1000  BP: 112/54  118/55 138/95  Pulse: 74  73 78  Temp:   97.6 F (36.4 C)   TempSrc:   Oral   Resp: 21  22 19   Height:      Weight:      SpO2: 92% 97% 97% 89%    Wt Readings from Last 3 Encounters:  04/10/15 58.1 kg (128 lb 1.4 oz)  03/07/15 57.607 kg (127 lb)  01/02/15 56.246 kg (124 lb)     Intake/Output Summary (Last 24 hours) at 04/11/15 1259 Last data filed at 04/11/15 0545  Gross per 24 hour  Intake    580 ml  Output   1250 ml  Net   -670 ml     Physical Exam  Awake Alert, Oriented, No new F.N deficits, Normal affect Patient had bruising on the face area, around lips, around the nose, and periorbital area. Supple Neck,No JVD, No cervical lymphadenopathy appriciated.  Symmetrical Chest wall movement, Good air movement bilaterally, no wheezing RRR ,No Gallops,Rubs, significant for systolic murmur No Parasternal Heave +ve B.Sounds, Abd Soft, No tenderness, No organomegaly appriciated, No rebound - guarding or rigidity. No Cyanosis, Clubbing or edema,   Data Review   Micro Results Recent Results (from the past 240 hour(s))  MRSA PCR Screening     Status: None    Collection Time: 04/09/15  1:36 AM  Result Value Ref Range Status   MRSA by PCR NEGATIVE NEGATIVE Final    Comment:        The GeneXpert MRSA Assay (FDA approved for NASAL specimens only), is one component of a comprehensive MRSA colonization surveillance program. It is not intended to diagnose MRSA infection nor to guide or monitor treatment for MRSA infections.   Culture, blood (routine x 2) Call MD if unable to obtain prior to antibiotics being given     Status: None (Preliminary result)   Collection Time: 04/09/15  2:10 AM  Result Value Ref Range Status   Specimen Description BLOOD LEFT ARM  Final   Special Requests   Final    BOTTLES DRAWN AEROBIC AND ANAEROBIC 10CC AER, 6CC ANA   Culture   Final           BLOOD CULTURE RECEIVED NO GROWTH TO DATE CULTURE WILL BE HELD FOR 5 DAYS BEFORE ISSUING A FINAL NEGATIVE REPORT Performed at [facility]    Report Status PENDING  Incomplete  Culture, blood (routine x 2) Call MD if unable to obtain prior to antibiotics being given     Status: None (Preliminary result)   Collection Time: 04/09/15  2:20 AM  Result Value Ref Range Status   Specimen Description BLOOD LEFT HAND  Final   Special Requests BOTTLES DRAWN AEROBIC ONLY [place]  Final   Culture   Final           BLOOD CULTURE RECEIVED NO GROWTH TO DATE CULTURE WILL BE HELD FOR 5 DAYS BEFORE ISSUING A FINAL NEGATIVE REPORT Performed at [facility]    Report Status PENDING  Incomplete    Radiology Reports No results found.  CBC  Recent Labs Lab 04/06/15 1248 04/08/15 2135 04/10/15 0352 04/11/15 0346  WBC 5.5 7.5 7.0 6.6  HGB 14.4 14.4 13.0 13.4  HCT 44.4 44.4 40.3 41.2  PLT 175 131* 142* 156  MCV 89.9 92.1 92.2 91.4  MCH 29.1 29.9 29.7 29.7  MCHC 32.4 32.4 32.3 32.5  RDW 13.9 13.9 14.0 13.9  LYMPHSABS 1.6 1.0  --   --   MONOABS 0.5 0.5  --   --   EOSABS 0.1 0.0  --   --   BASOSABS 0.1 0.0  --   --     Chemistries   Recent Labs Lab 04/06/15 1248  04/08/15 2135 04/09/15 0210 04/10/15 0352 04/11/15 0346  NA 136 135 137 133* 139  K 4.6 5.0 4.7 4.3 4.3  CL 100 97 98 95* 99  CO2 28 31 32 30 33*  GLUCOSE 93 141* 124* 100* 89  BUN 15 16 16 16 13   CREATININE 0.97 0.99 0.92 0.91 0.88  CALCIUM 9.1 8.7 8.5 8.1* 8.4  AST  --   --  154*  --   --   ALT  --   --  137*  --   --   ALKPHOS  --   --  47  --   --   BILITOT  --   --  0.6  --   --    ------------------------------------------------------------------------------------------------------------------ estimated creatinine clearance is 42.1 mL/min (by C-G formula based on Cr of 0.88). ------------------------------------------------------------------------------------------------------------------ No results for input(s): HGBA1C in the last 72 hours. ------------------------------------------------------------------------------------------------------------------ No results for input(s): CHOL, HDL, LDLCALC, TRIG, CHOLHDL, LDLDIRECT in the last 72 hours. ------------------------------------------------------------------------------------------------------------------ No results for input(s): TSH, T4TOTAL, T3FREE, THYROIDAB in the last 72 hours.  Invalid input(s): FREET3 ------------------------------------------------------------------------------------------------------------------ No results for input(s): VITAMINB12, FOLATE, FERRITIN, TIBC, IRON, RETICCTPCT in the last 72 hours.  Coagulation profile  Recent Labs Lab 04/06/15 1248  INR 1.23    No results for input(s): DDIMER in the last 72 hours.  Cardiac Enzymes  Recent Labs Lab 04/09/15 0220 04/09/15 0924 04/09/15 1515  TROPONINI 0.04* 0.03 0.03   ------------------------------------------------------------------------------------------------------------------ Invalid input(s): POCBNP     Time Spent in minutes   30 minutes   Livio Ledwith M.D on 04/11/2015 at 12:59 PM  Between 7am to 7pm - Pager -  [phone]  After 7pm go to  [email or web address] - password [email or web address]  And look for the night coverage person covering for me after hours  [facility] Office  [phone]   **Disclaimer: This note may have been dictated with voice recognition software. Similar sounding words can inadvertently be transcribed and this note may contain transcription errors which may not have been corrected upon publication of note.**

## 2015-04-12 DIAGNOSIS — R05 Cough: Secondary | ICD-10-CM

## 2015-04-12 DIAGNOSIS — R7989 Other specified abnormal findings of blood chemistry: Secondary | ICD-10-CM

## 2015-04-12 DIAGNOSIS — R945 Abnormal results of liver function studies: Secondary | ICD-10-CM

## 2015-04-12 DIAGNOSIS — J9620 Acute and chronic respiratory failure, unspecified whether with hypoxia or hypercapnia: Secondary | ICD-10-CM | POA: Diagnosis present

## 2015-04-12 LAB — HEPATIC FUNCTION PANEL
ALBUMIN: 3.4 g/dL — AB (ref 3.5–5.2)
ALK PHOS: 39 U/L (ref 39–117)
ALT: 116 U/L — ABNORMAL HIGH (ref 0–35)
AST: 87 U/L — ABNORMAL HIGH (ref 0–37)
BILIRUBIN DIRECT: 0.2 mg/dL (ref 0.0–0.5)
Indirect Bilirubin: 0.4 mg/dL (ref 0.3–0.9)
Total Bilirubin: 0.6 mg/dL (ref 0.3–1.2)
Total Protein: 5.8 g/dL — ABNORMAL LOW (ref 6.0–8.3)

## 2015-04-12 LAB — BASIC METABOLIC PANEL
Anion gap: 7 (ref 5–15)
BUN: 13 mg/dL (ref 6–23)
CO2: 30 mmol/L (ref 19–32)
CREATININE: 0.86 mg/dL (ref 0.50–1.10)
Calcium: 8.5 mg/dL (ref 8.4–10.5)
Chloride: 102 mmol/L (ref 96–112)
GFR, EST AFRICAN AMERICAN: 69 mL/min — AB (ref >90)
GFR, EST NON AFRICAN AMERICAN: 59 mL/min — AB (ref >90)
GLUCOSE: 112 mg/dL — AB (ref 70–99)
POTASSIUM: 3.9 mmol/L (ref 3.5–5.1)
Sodium: 139 mmol/L (ref 135–145)

## 2015-04-12 LAB — CBC
HEMATOCRIT: 43.1 % (ref 36.0–46.0)
Hemoglobin: 13.9 g/dL (ref 12.0–15.0)
MCH: 29.3 pg (ref 26.0–34.0)
MCHC: 32.3 g/dL (ref 30.0–36.0)
MCV: 90.7 fL (ref 78.0–100.0)
Platelets: 185 10*3/uL (ref 150–400)
RBC: 4.75 MIL/uL (ref 3.87–5.11)
RDW: 14 % (ref 11.5–15.5)
WBC: 6.6 10*3/uL (ref 4.0–10.5)

## 2015-04-12 MED ORDER — TRAMADOL HCL 50 MG PO TABS
50.0000 mg | ORAL_TABLET | Freq: Two times a day (BID) | ORAL | Status: DC | PRN
  Filled 2015-04-12: qty 1

## 2015-04-12 NOTE — Progress Notes (Addendum)
Progress Note   [name] SWN:[id number] DOB: [birth year] DOA: 04/08/2015 PCP: Unice Cobble, MD   Brief Narrative:   Kristine Simmons is an 81 y.o. female witha PMH of chronic aspiration, followed by pulmonology, chronic respiratory failure requiring oxygen at night, chronic cough with lung scarring who was brought in for evaluation of hypoxia after her husband found her with altered mental status and checked her pulse oximetry with a home meter. Of note, she had a recent fall while working at the yard she lost her balance and fell hitting her face. She was seen in the ED 04/06/15 and was noted to have nasal bridge fracture. CT Angio chest showed multilobar pulmonary pneumonia, no evidence of PE.  Assessment/Plan:   Principal problem:  Community-acquired pneumonia versus aspiration pneumonia - Patient has history of chronic aspiration, SLP evaluation appreciated, continue with regular diet -Continue with IV Zosyn and azithromycin, day 5/7. -Blood culture: No growth to date. -Follow on sputum culture. - Patient continues to improve.  Active problems:  Elevated LFTs - May be related to Vytorin. Trending down. Monitor.  COPD - No wheezing, so no indication for steroids, continue with nebulizer treatment.  Acute on chronic hypoxic respiratory failure - At baseline patient is on 2 L nasal cannula at bedtime, still requiring oxygen daytime. - Continue treatment for pneumonia, wean oxygen as tolerated.  Atrial fibrillation - Heart rate controlled on Cardizem and digoxin. - Patient is on Eliquis for anticoagulation, patient had one fall in so many years, for now continue with anticoagulation. - ChadVAsc >2. - Okay to discontinue telemetry.  Hypothyroidism - Continue with Synthroid.    DVT Prophylaxis - On chronic anticoagulation.  Code Status: DNR Family Communication: Rush Landmark (husband) at bedside. Disposition Plan: Home when stable, likely 1-2 days.   IV Access:     Peripheral IV   Procedures and diagnostic studies:   Dg Chest 2 View  04/08/2015   CLINICAL DATA:  Patient with altered mental status.  EXAM: CHEST  2 VIEW  COMPARISON:  Chest radiograph 11/25/2014  FINDINGS: Monitoring leads overlie the patient. Stable enlarged cardiac and mediastinal contours. Emphysematous change. Biapical scarring. Interval development of left-greater-than-right mid lung heterogeneous opacities. Additionally there are new consolidative opacities within the left lower lung. Suggestion of small bilateral pleural effusions.  IMPRESSION: New focal retrocardiac consolidation may represent pneumonia in the appropriate clinical setting. There are small bilateral pleural effusions. Additional heterogeneous opacities within the bilateral mid lungs may represent infection and/or possible edema. Recommend short term radiographic followup.   Electronically Signed   By: Lovey Newcomer M.D.   On: 04/08/2015 22:05   Ct Head Wo Contrast  04/08/2015   CLINICAL DATA:  Patient with altered mental status.  EXAM: CT HEAD WITHOUT CONTRAST  TECHNIQUE: Contiguous axial images were obtained from the base of the skull through the vertex without intravenous contrast.  COMPARISON:  Brain CT 04/06/2015  FINDINGS: Ventricles and sulci are appropriate for patient's age. Re- demonstrated chronic left basal ganglia infarct. No evidence for acute cortically based infarct, intracranial hemorrhage, mass lesion or mass-effect. Periventricular and subcortical white matter hypodensity compatible with chronic small vessel ischemic changes. Bilateral cataract surgery. Paranasal sinuses are well aerated. Mastoid air cells unremarkable. Calvarium is intact.  IMPRESSION: No acute intracranial process.  Chronic small vessel ischemic changes.  Remote lacunar infarct within the left basal ganglia.   Electronically Signed   By: Lovey Newcomer M.D.   On: 04/08/2015 22:11   Ct Head Wo  Contrast  04/06/2015   CLINICAL DATA:  Dizziness  and a fall today with a blow to the face and nose. Nosebleed, facial contusion and abrasion.  EXAM: CT HEAD WITHOUT CONTRAST  CT MAXILLOFACIAL WITHOUT CONTRAST  CT CERVICAL SPINE WITHOUT CONTRAST  TECHNIQUE: Multidetector CT imaging of the head, cervical spine, and maxillofacial structures were performed using the standard protocol without intravenous contrast. Multiplanar CT image reconstructions of the cervical spine and maxillofacial structures were also generated.  COMPARISON:  Headache maxillofacial CT scans 03/12/2011.  FINDINGS: CT HEAD FINDINGS  Cortical atrophy and chronic microvascular ischemic change are again seen. Remote lacunar infarction in the left basal ganglia is unchanged in appearance. No acute abnormality including hemorrhage, infarct, mass lesion, mass effect, midline shift or abnormal extra-axial fluid collection is identified. There is no hydrocephalus or pneumocephalus. The calvarium is intact.  CT MAXILLOFACIAL FINDINGS  The patient has a acute bilateral nasal bone fractures with minimal impaction of the superior aspect of the right nasal bone identified. The facial bones are otherwise intact. The mandibular condyles are located with degenerative change seen. The patient is status post bilateral lens extraction. The globes are intact and orbital fat is clear. Soft tissue contusion above the nose and forehead is noted.  CT CERVICAL SPINE FINDINGS  Vertebral body height is maintained. There is mild reversal of the normal cervical lordosis. Multilevel degenerative disc disease appears worst at C5-6 and C6-7. There also has multilevel facet arthropathy. Paraspinous structures demonstrate carotid atherosclerosis. Lung apices are emphysematous but clear.  IMPRESSION: No acute abnormality cervical spine.  Bilateral nasal bone fractures with minimal depression of the superior aspect of the right base above identified. Forehead soft tissue contusion is also seen.  Atrophy and chronic microvascular  ischemic change.  Cervical spondylosis.  Emphysema.   Electronically Signed   By: Inge Rise M.D.   On: 04/06/2015 14:22   Ct Angio Chest Pe W/cm &/or Wo Cm  04/08/2015   CLINICAL DATA:  Hypoxia today. History of atrial fibrillation, mild aortic stenosis.  EXAM: CT ANGIOGRAPHY CHEST WITH CONTRAST  TECHNIQUE: Multidetector CT imaging of the chest was performed using the standard protocol during bolus administration of intravenous contrast. Multiplanar CT image reconstructions and MIPs were obtained to evaluate the vascular anatomy.  CONTRAST:  57mL OMNIPAQUE IOHEXOL 350 MG/ML SOLN  COMPARISON:  Chest radiograph April 08, 2015 at 2146 hours  FINDINGS: Adequate pulmonary arterial contrast opacification for assessment. Main pulmonary artery is not enlarged. No pulmonary arterial filling defect to the subsegmental branches. Heart size is mildly enlarged, accentuated by a mild pectus excavatum. No pericardial fluid collections. Thoracic aorta is normal in course and caliber with moderate to severe calcific atherosclerosis. Prominent mediastinal lymph nodes, measuring up to 12 mm at precarinal level.  Moderate to severe centrilobular emphysema. Patchy consolidation upper lobes bilaterally associated with small to moderate bilateral pleural effusions. Mildly enhancing atelectasis in lung bases bilaterally with superimposed patchy areas of hypoenhancement. Tracheobronchial tree is patent and midline, no pneumothorax.  Included view of the abdomen is nonsuspicious, splenic granulomas noted. Patient is osteopenic, mild degenerative change of thoracic spine. LEFT humeral arthroplasty.  Review of the MIP images confirms the above findings.  IMPRESSION: No acute pulmonary embolism.  Patchy consolidation consistent with multifocal pneumonia with small bilateral pleural effusions and lower lobe atelectasis. Moderate to severe centrilobular emphysema.  Mild mediastinal lymphadenopathy is likely reactive.  Mild cardiomegaly.    Electronically Signed   By: Elon Alas   On: 04/08/2015 23:13  Ct Cervical Spine Wo Contrast  04/06/2015   CLINICAL DATA:  Dizziness and a fall today with a blow to the face and nose. Nosebleed, facial contusion and abrasion.  EXAM: CT HEAD WITHOUT CONTRAST  CT MAXILLOFACIAL WITHOUT CONTRAST  CT CERVICAL SPINE WITHOUT CONTRAST  TECHNIQUE: Multidetector CT imaging of the head, cervical spine, and maxillofacial structures were performed using the standard protocol without intravenous contrast. Multiplanar CT image reconstructions of the cervical spine and maxillofacial structures were also generated.  COMPARISON:  Headache maxillofacial CT scans 03/12/2011.  FINDINGS: CT HEAD FINDINGS  Cortical atrophy and chronic microvascular ischemic change are again seen. Remote lacunar infarction in the left basal ganglia is unchanged in appearance. No acute abnormality including hemorrhage, infarct, mass lesion, mass effect, midline shift or abnormal extra-axial fluid collection is identified. There is no hydrocephalus or pneumocephalus. The calvarium is intact.  CT MAXILLOFACIAL FINDINGS  The patient has a acute bilateral nasal bone fractures with minimal impaction of the superior aspect of the right nasal bone identified. The facial bones are otherwise intact. The mandibular condyles are located with degenerative change seen. The patient is status post bilateral lens extraction. The globes are intact and orbital fat is clear. Soft tissue contusion above the nose and forehead is noted.  CT CERVICAL SPINE FINDINGS  Vertebral body height is maintained. There is mild reversal of the normal cervical lordosis. Multilevel degenerative disc disease appears worst at C5-6 and C6-7. There also has multilevel facet arthropathy. Paraspinous structures demonstrate carotid atherosclerosis. Lung apices are emphysematous but clear.  IMPRESSION: No acute abnormality cervical spine.  Bilateral nasal bone fractures with minimal  depression of the superior aspect of the right base above identified. Forehead soft tissue contusion is also seen.  Atrophy and chronic microvascular ischemic change.  Cervical spondylosis.  Emphysema.   Electronically Signed   By: Inge Rise M.D.   On: 04/06/2015 14:22   Ct Maxillofacial Wo Cm  04/06/2015   CLINICAL DATA:  Dizziness and a fall today with a blow to the face and nose. Nosebleed, facial contusion and abrasion.  EXAM: CT HEAD WITHOUT CONTRAST  CT MAXILLOFACIAL WITHOUT CONTRAST  CT CERVICAL SPINE WITHOUT CONTRAST  TECHNIQUE: Multidetector CT imaging of the head, cervical spine, and maxillofacial structures were performed using the standard protocol without intravenous contrast. Multiplanar CT image reconstructions of the cervical spine and maxillofacial structures were also generated.  COMPARISON:  Headache maxillofacial CT scans 03/12/2011.  FINDINGS: CT HEAD FINDINGS  Cortical atrophy and chronic microvascular ischemic change are again seen. Remote lacunar infarction in the left basal ganglia is unchanged in appearance. No acute abnormality including hemorrhage, infarct, mass lesion, mass effect, midline shift or abnormal extra-axial fluid collection is identified. There is no hydrocephalus or pneumocephalus. The calvarium is intact.  CT MAXILLOFACIAL FINDINGS  The patient has a acute bilateral nasal bone fractures with minimal impaction of the superior aspect of the right nasal bone identified. The facial bones are otherwise intact. The mandibular condyles are located with degenerative change seen. The patient is status post bilateral lens extraction. The globes are intact and orbital fat is clear. Soft tissue contusion above the nose and forehead is noted.  CT CERVICAL SPINE FINDINGS  Vertebral body height is maintained. There is mild reversal of the normal cervical lordosis. Multilevel degenerative disc disease appears worst at C5-6 and C6-7. There also has multilevel facet arthropathy.  Paraspinous structures demonstrate carotid atherosclerosis. Lung apices are emphysematous but clear.  IMPRESSION: No acute abnormality cervical spine.  Bilateral  nasal bone fractures with minimal depression of the superior aspect of the right base above identified. Forehead soft tissue contusion is also seen.  Atrophy and chronic microvascular ischemic change.  Cervical spondylosis.  Emphysema.   Electronically Signed   By: Inge Rise M.D.   On: 04/06/2015 14:22     Medical Consultants:    None.  Anti-Infectives:    Azithromycin 04/08/15--->  Zosyn 04/09/15--->  Ceftriaxone 04/08/15 --->04/09/15  Subjective:   Kristine Simmons denies dyspnea, has a cough productive of yellow mucous.  Appetite poor.  Last BM was yesterday.  Objective:    Filed Vitals:   04/11/15 1421 04/11/15 2139 04/12/15 0501 04/12/15 1402  BP: 113/62 123/59 152/77 129/69  Pulse: 82 74 95 64  Temp: 97.6 F (36.4 C) 98 F (36.7 C) 97.8 F (36.6 C) 97.8 F (36.6 C)  TempSrc: Oral Oral Oral Oral  Resp: 18 17 18 18   Height:      Weight:      SpO2: 92% 94% 92% 95%    Intake/Output Summary (Last 24 hours) at 04/12/15 1421 Last data filed at 04/12/15 0658  Gross per 24 hour  Intake    590 ml  Output    800 ml  Net   -210 ml    Exam: Gen:  NAD Head & Neck: Multiple facial ecchymosis.  Nose taped up.  Oxygen via [place]. Cardiovascular:  HSIR, III/VI SEM Respiratory:  Lungs CTAB Gastrointestinal:  Abdomen soft, NT/ND, + BS Extremities:  No C/E/C   Data Reviewed:    Labs: Basic Metabolic Panel:  Recent Labs Lab 04/08/15 2135 04/09/15 0210 04/10/15 0352 04/11/15 0346 04/12/15 0350  NA 135 137 133* 139 139  K 5.0 4.7 4.3 4.3 3.9  CL 97 98 95* 99 102  CO2 31 32 30 33* 30  GLUCOSE 141* 124* 100* 89 112*  BUN 16 16 16 13 13   CREATININE 0.99 0.92 0.91 0.88 0.86  CALCIUM 8.7 8.5 8.1* 8.4 8.5   GFR Estimated Creatinine Clearance: 43.1 mL/min (by C-G formula based on Cr of 0.86). Liver  Function Tests:  Recent Labs Lab 04/09/15 0210 04/12/15 0350  AST 154* 87*  ALT 137* 116*  ALKPHOS 47 39  BILITOT 0.6 0.6  PROT 6.6 5.8*  ALBUMIN 3.9 3.4*   Coagulation profile  Recent Labs Lab 04/06/15 1248  INR 1.23    CBC:  Recent Labs Lab 04/06/15 1248 04/08/15 2135 04/10/15 0352 04/11/15 0346 04/12/15 0350  WBC 5.5 7.5 7.0 6.6 6.6  NEUTROABS 3.3 5.9  --   --   --   HGB 14.4 14.4 13.0 13.4 13.9  HCT 44.4 44.4 40.3 41.2 43.1  MCV 89.9 92.1 92.2 91.4 90.7  PLT 175 131* 142* 156 185   Cardiac Enzymes:  Recent Labs Lab 04/09/15 0220 04/09/15 0924 04/09/15 1515  TROPONINI 0.04* 0.03 0.03   BNP (last 3 results)  Recent Labs  11/18/14 0341  PROBNP 6363.0*   CBG:  Recent Labs Lab 04/09/15 0433  GLUCAP 110*   Microbiology Recent Results (from the past 240 hour(s))  MRSA PCR Screening     Status: None   Collection Time: 04/09/15  1:36 AM  Result Value Ref Range Status   MRSA by PCR NEGATIVE NEGATIVE Final    Comment:        The GeneXpert MRSA Assay (FDA approved for NASAL specimens only), is one component of a comprehensive MRSA colonization surveillance program. It is not intended to diagnose MRSA infection nor to  guide or monitor treatment for MRSA infections.   Culture, blood (routine x 2) Call MD if unable to obtain prior to antibiotics being given     Status: None (Preliminary result)   Collection Time: 04/09/15  2:10 AM  Result Value Ref Range Status   Specimen Description BLOOD LEFT ARM  Final   Special Requests   Final    BOTTLES DRAWN AEROBIC AND ANAEROBIC 10CC AER, 6CC ANA   Culture   Final           BLOOD CULTURE RECEIVED NO GROWTH TO DATE CULTURE WILL BE HELD FOR 5 DAYS BEFORE ISSUING A FINAL NEGATIVE REPORT Performed at [facility]    Report Status PENDING  Incomplete  Culture, blood (routine x 2) Call MD if unable to obtain prior to antibiotics being given     Status: None (Preliminary result)   Collection Time:  04/09/15  2:20 AM  Result Value Ref Range Status   Specimen Description BLOOD LEFT HAND  Final   Special Requests BOTTLES DRAWN AEROBIC ONLY 6CC  Final   Culture   Final           BLOOD CULTURE RECEIVED NO GROWTH TO DATE CULTURE WILL BE HELD FOR 5 DAYS BEFORE ISSUING A FINAL NEGATIVE REPORT Performed at [facility]    Report Status PENDING  Incomplete     Medications:   . apixaban  2.5 mg Oral BID  . azithromycin  500 mg Intravenous Q24H  . digoxin  0.125 mg Oral Daily  . diltiazem  120 mg Oral Daily  . FLORA-Q  1 capsule Oral Daily  . guaiFENesin  600 mg Oral BID  . latanoprost  1 drop Both Eyes QHS  . levothyroxine  50 mcg Oral QAC breakfast  . piperacillin-tazobactam (ZOSYN)  IV  3.375 g Intravenous 3 times per day  . polyethylene glycol  17 g Oral Daily  . sertraline  25 mg Oral Daily  . timolol  1 drop Both Eyes BID  . trimethoprim  100 mg Oral Q48H   Continuous Infusions:   Time spent: 25 minutes.   LOS: 4 days   [facility] Pager [phone]. If unable to reach me by pager, please call my cell phone at [phone].  *Please refer to [email or web address], password [email or web address] to get updated schedule on who will round on this patient, as hospitalists switch teams weekly. If 7PM-7AM, please contact night-coverage at [email or web address], password [email or web address] for any overnight needs.  04/12/2015, 2:21 PM

## 2015-04-12 NOTE — Evaluation (Signed)
Physical Therapy Evaluation Patient Details Name: OLESYA WIKE MRN: [id number] DOB: 30-Oct-1927 Today's Date: 04/12/2015   History of Present Illness  81 yo female admitted with AMS, A fib, hypoxia, Pna. Recent fall 4/14 resulting in nondisplaced nasal bone fractures. hx of Afib, CVA, osteoporosis, blindness.  Clinical Impression  On eval, pt was Min assist for ambulation distance of ~200 feet while holding onto IV pole. Pt tolerated activity well. Unsteady so instructed pt to use walker at home. Recommend HHPT.     Follow Up Recommendations Home health PT;Supervision/Assistance - 24 hour    Equipment Recommendations  None recommended by PT (pt has access to all DME)    Recommendations for Other Services OT consult     Precautions / Restrictions Precautions Precautions: Fall Precaution Comments: monitor O2 sats Restrictions Weight Bearing Restrictions: No      Mobility  Bed Mobility Overal bed mobility: Needs Assistance Bed Mobility: Supine to Sit;Sit to Supine     Supine to sit: HOB elevated;Supervision Sit to supine: HOB elevated;Supervision      Transfers Overall transfer level: Needs assistance   Transfers: Sit to/from Stand Sit to Stand: Min guard         General transfer comment: close guard for safety  Ambulation/Gait Ambulation/Gait assistance: Min assist Ambulation Distance (Feet): 200 Feet Assistive device:  (IV pole) Gait Pattern/deviations: Step-through pattern;Decreased stride length;Drifts right/left     General Gait Details: Assist to stabilize intermittently. Pt held onto IV pole for support. O2 sats 86-87% on RA.   Stairs            Wheelchair Mobility    Modified Rankin (Stroke Patients Only)       Balance Overall balance assessment: Needs assistance;History of Falls         Standing balance support: No upper extremity supported;During functional activity Standing balance-Leahy Scale: Fair                                Pertinent Vitals/Pain Pain Assessment: No/denies pain    Home Living Family/patient expects to be discharged to:: Private residence Living Arrangements: Spouse/significant other Available Help at Discharge: Family Type of Home: House Home Access: Stairs to enter     Home Layout: One level Home Equipment: Environmental consultant - 2 wheels;Cane - single point      Prior Function Level of Independence: Needs assistance   Gait / Transfers Assistance Needed: holds onto objects and/or spouse's arm  ADL's / Homemaking Assistance Needed: spouse assists fue to vision        Hand Dominance        Extremity/Trunk Assessment               Lower Extremity Assessment: Generalized weakness      Cervical / Trunk Assessment: Kyphotic  Communication   Communication: No difficulties  Cognition Arousal/Alertness: Awake/alert Behavior During Therapy: WFL for tasks assessed/performed Overall Cognitive Status: Within Functional Limits for tasks assessed                      General Comments      Exercises        Assessment/Plan    PT Assessment Patient needs continued PT services  PT Diagnosis Difficulty walking;Generalized weakness   PT Problem List Decreased mobility;Decreased activity tolerance;Decreased balance;Decreased strength  PT Treatment Interventions DME instruction;Gait training;Functional mobility training;Therapeutic activities;Therapeutic exercise;Patient/family education;Balance training   PT Goals (Current goals can be found  in the Care Plan section) Acute Rehab PT Goals Patient Stated Goal: home soon PT Goal Formulation: With patient/family Time For Goal Achievement: 04/26/15 Potential to Achieve Goals: Good    Frequency Min 3X/week   Barriers to discharge        Co-evaluation               End of Session Equipment Utilized During Treatment: Gait belt Activity Tolerance: Patient tolerated treatment well Patient left: in  bed;with call bell/phone within reach;with bed alarm set;with family/visitor present           Time: 8184-0375 PT Time Calculation (min) (ACUTE ONLY): 27 min   Charges:   PT Evaluation $Initial PT Evaluation Tier I: 1 Procedure     PT G Codes:        Weston Anna, MPT Pager: [phone]

## 2015-04-12 NOTE — Progress Notes (Signed)
CARE MANAGEMENT NOTE 04/12/2015  Patient:  Kristine Simmons, Kristine Simmons   Account Number:  [id number]  Date Initiated:  04/10/2015  Documentation initiated by:  DAVIS,RHONDA  Subjective/Objective Assessment:   hypoxia     Action/Plan:   home when stable   Anticipated DC Date:  04/14/2015   Anticipated DC Plan:  [place] referral  NA      DC Planning Services  CM consult      [facility] Choice  NA   Choice offered to / List presented to:  C-1 Patient        [place] arranged  HH-2 PT      [facility]   Status of service:  In process, will continue to follow Medicare Important Message given?   (If response is "NO", the following Medicare IM given date fields will be blank) Date Medicare IM given:   Medicare IM given by:   Date Additional Medicare IM given:   Additional Medicare IM given by:    Discharge Disposition:    Per UR Regulation:  Reviewed for med. necessity/level of care/duration of stay  If discussed at [place] of Stay Meetings, dates discussed:    Comments:  04/12/15 Dessa Phi RN BSN  NCM [phone] PT-HH.[facility] chosen for HHPT.TC [facility] rep Tim aware of HHPT order.Await d/c order.Already receives home 02-[facility] @ [facility]. If needed continuous can arrange if qualifies & ordered.  [place], RN, BSN, [place] [phone] Chart Reviewed. Discharge needs at time of review: None present will follow for needs. Chart note for progression:

## 2015-04-13 ENCOUNTER — Inpatient Hospital Stay (HOSPITAL_COMMUNITY): Payer: Medicare Other

## 2015-04-13 DIAGNOSIS — Z7901 Long term (current) use of anticoagulants: Secondary | ICD-10-CM

## 2015-04-13 DIAGNOSIS — J9621 Acute and chronic respiratory failure with hypoxia: Secondary | ICD-10-CM

## 2015-04-13 DIAGNOSIS — R1314 Dysphagia, pharyngoesophageal phase: Secondary | ICD-10-CM

## 2015-04-13 DIAGNOSIS — R7989 Other specified abnormal findings of blood chemistry: Secondary | ICD-10-CM

## 2015-04-13 DIAGNOSIS — E038 Other specified hypothyroidism: Secondary | ICD-10-CM

## 2015-04-13 LAB — COMPREHENSIVE METABOLIC PANEL
ALK PHOS: 38 U/L — AB (ref 39–117)
ALT: 98 U/L — ABNORMAL HIGH (ref 0–35)
AST: 64 U/L — AB (ref 0–37)
Albumin: 3.3 g/dL — ABNORMAL LOW (ref 3.5–5.2)
Anion gap: 3 — ABNORMAL LOW (ref 5–15)
BILIRUBIN TOTAL: 0.8 mg/dL (ref 0.3–1.2)
BUN: 11 mg/dL (ref 6–23)
CHLORIDE: 103 mmol/L (ref 96–112)
CO2: 31 mmol/L (ref 19–32)
Calcium: 8.4 mg/dL (ref 8.4–10.5)
Creatinine, Ser: 0.92 mg/dL (ref 0.50–1.10)
GFR calc Af Amer: 64 mL/min — ABNORMAL LOW (ref >90)
GFR calc non Af Amer: 55 mL/min — ABNORMAL LOW (ref >90)
Glucose, Bld: 100 mg/dL — ABNORMAL HIGH (ref 70–99)
Potassium: 3.8 mmol/L (ref 3.5–5.1)
Sodium: 137 mmol/L (ref 135–145)
TOTAL PROTEIN: 5.7 g/dL — AB (ref 6.0–8.3)

## 2015-04-13 NOTE — Progress Notes (Signed)
Chaplain visit at the request of Ms Kristine Simmons's faith community.  Ms Kristine Simmons spiritual pain comes from her inability to know her physical limits. She is having trouble accepting that some things she has done all of her life are no longer possible to do. She is a person who has always taken care of herself, her family and any that she could care for. This included a very active spiritual life that involved helping those in need. With this latest physical fall she has become aware of her limitations, and that is emotionally painful. She is not a person that allows others to help her, although she is among the first to help others.   Ms Kristine Simmons has strong and virtually unlimited care and support from both her family and her faith community. She is well respected by both. She has had a life well and fully lived. Her desire is to remain as independent as she is able, which means finding a balance between wearing an oxygen mask when needed and being independent of an oxygen mask when possible.   Ms Kristine Simmons reports the care she has received has been exceptional. She commends the staff for their genuine care and counsel.  Prayer and grief counsel rendered at Ms Kristine Simmons's request.  Sallee Lange. Christiano Blandon, [place]

## 2015-04-13 NOTE — Progress Notes (Signed)
Physical Therapy Treatment Patient Details Name: Kristine Simmons MRN: [id number] DOB: [birth year] Today's Date: 04/13/2015    History of Present Illness 81 yo female admitted with AMS, A fib, hypoxia, Pna. Recent fall 4/14 resulting in nondisplaced nasal bone fractures. hx of Afib, CVA, osteoporosis, blindness.    PT Comments    Pt limited today d/t incontinence of bowels during amb; see additional note for O2 sats;  Follow Up Recommendations  Home health PT;Supervision/Assistance - 24 hour     Equipment Recommendations  None recommended by PT (has all DME)    Recommendations for Other Services       Precautions / Restrictions Precautions Precautions: Fall Precaution Comments: monitor O2 sats    Mobility  Bed Mobility Overal bed mobility: Needs Assistance Bed Mobility: Supine to Sit;Sit to Supine     Supine to sit: HOB elevated;Supervision Sit to supine: HOB elevated;Supervision   General bed mobility comments: cues to not get up without assist  Transfers Overall transfer level: Needs assistance Equipment used: None;1 person hand held assist (IV pole) Transfers: Sit to/from Stand Sit to Stand: Min guard;Min assist         General transfer comment: for safety, cues to wait for assist,posterior lean on initial standing  Ambulation/Gait Ambulation/Gait assistance: Min assist Ambulation Distance (Feet): 50 Feet Assistive device: 1 person hand held assist;None (IV pole) Gait Pattern/deviations: Step-through pattern;Decreased stride length;Drifts right/left;Narrow base of support;Trunk flexed     General Gait Details: cues for step length, posture; O2 sats decr to 85% on RA, returned pt to 2L [place] O2   Stairs            Wheelchair Mobility    Modified Rankin (Stroke Patients Only)       Balance Overall balance assessment: History of Falls;Needs assistance           Standing balance-Leahy Scale: Poor Standing balance comment: pt requiring UE  assist to maintain static stand today; have advised pt to use her RW at home, she is at risk for continued falls                    Cognition Arousal/Alertness: Awake/alert Behavior During Therapy: WFL for tasks assessed/performed Overall Cognitive Status: Within Functional Limits for tasks assessed                      Exercises      General Comments        Pertinent Vitals/Pain Pain Assessment: No/denies pain    Home Living                      Prior Function            PT Goals (current goals can now be found in the care plan section) Acute Rehab PT Goals Patient Stated Goal: home soon PT Goal Formulation: With patient/family Time For Goal Achievement: 04/26/15 Potential to Achieve Goals: Good Progress towards PT goals: Progressing toward goals    Frequency  Min 3X/week    PT Plan Current plan remains appropriate    Co-evaluation             End of Session Equipment Utilized During Treatment: Gait belt Activity Tolerance: Patient tolerated treatment well;Other (comment) (limited d/t incontinence of bowels) Patient left: in bed;with family/visitor present;with call bell/phone within reach;with bed alarm set     Time: 9622-2979 PT Time Calculation (min) (ACUTE ONLY): 28 min  Charges:  $Gait Training: 8-22  mins $Therapeutic Activity: 8-22 mins                    G Codes:      Leonell Lobdell 26-Apr-2015, 2:37 PM

## 2015-04-13 NOTE — Progress Notes (Signed)
Progress Note   Kristine Simmons DOB: 1927-02-27 DOA: 04/08/2015 PCP: Unice Cobble, MD   Brief Narrative:   Kristine Simmons is an 81 y.o. female witha PMH of chronic aspiration, followed by pulmonology, chronic respiratory failure requiring oxygen at night, chronic cough with lung scarring who was brought in for evaluation of hypoxia after her husband found her with altered mental status and checked her pulse oximetry with a home meter. Of note, she had a recent fall while working at the yard she lost her balance and fell hitting her face. She was seen in the ED 04/06/15 and was noted to have nasal bridge fracture. CT Angio chest showed multilobar pulmonary pneumonia, no evidence of PE.  Assessment/Plan:   Principal problem:  Community-acquired pneumonia versus aspiration pneumonia - Patient has history of chronic aspiration, SLP evaluation appreciated, continue with regular diet -Continue with IV Zosyn and azithromycin, day 6/7. -Blood culture: No growth to date. -Follow on sputum culture. - Patient continues to improve.  Active problems:  Left shoulder pain - Likely from trauma/fall.  Check chest X-ray.  Elevated LFTs - May be related to Vytorin. Trending down. Monitor.  COPD - No wheezing, so no indication for steroids, continue with nebulizer treatment.  Acute on chronic hypoxic respiratory failure - At baseline patient is on 2 L nasal cannula at bedtime, still requiring oxygen daytime.  Wean as tolerated. - Continue treatment for pneumonia, wean oxygen as tolerated.  Atrial fibrillation - Heart rate controlled on Cardizem and digoxin. - Patient is on Eliquis for anticoagulation, patient had one fall in so many years, for now continue with anticoagulation. - ChadVAsc >2. - Okay to discontinue telemetry.  Hypothyroidism - Continue with Synthroid.    DVT Prophylaxis - On chronic anticoagulation.  Code Status: DNR Family Communication: Rush Landmark  (husband) at bedside. Disposition Plan: Home when stable, likely later today or tomorrow, still feels weak.   IV Access:    Peripheral IV   Procedures and diagnostic studies:   Dg Chest 2 View  04/08/2015   CLINICAL DATA:  Patient with altered mental status.  EXAM: CHEST  2 VIEW  COMPARISON:  Chest radiograph 11/25/2014  FINDINGS: Monitoring leads overlie the patient. Stable enlarged cardiac and mediastinal contours. Emphysematous change. Biapical scarring. Interval development of left-greater-than-right mid lung heterogeneous opacities. Additionally there are new consolidative opacities within the left lower lung. Suggestion of small bilateral pleural effusions.  IMPRESSION: New focal retrocardiac consolidation may represent pneumonia in the appropriate clinical setting. There are small bilateral pleural effusions. Additional heterogeneous opacities within the bilateral mid lungs may represent infection and/or possible edema. Recommend short term radiographic followup.   Electronically Signed   By: Lovey Newcomer M.D.   On: 04/08/2015 22:05   Ct Head Wo Contrast  04/08/2015   CLINICAL DATA:  Patient with altered mental status.  EXAM: CT HEAD WITHOUT CONTRAST  TECHNIQUE: Contiguous axial images were obtained from the base of the skull through the vertex without intravenous contrast.  COMPARISON:  Brain CT 04/06/2015  FINDINGS: Ventricles and sulci are appropriate for patient's age. Re- demonstrated chronic left basal ganglia infarct. No evidence for acute cortically based infarct, intracranial hemorrhage, mass lesion or mass-effect. Periventricular and subcortical white matter hypodensity compatible with chronic small vessel ischemic changes. Bilateral cataract surgery. Paranasal sinuses are well aerated. Mastoid air cells unremarkable. Calvarium is intact.  IMPRESSION: No acute intracranial process.  Chronic small vessel ischemic changes.  Remote lacunar infarct within the left basal ganglia.  Electronically Signed   By: Lovey Newcomer M.D.   On: 04/08/2015 22:11   Ct Head Wo Contrast  04/06/2015   CLINICAL DATA:  Dizziness and a fall today with a blow to the face and nose. Nosebleed, facial contusion and abrasion.  EXAM: CT HEAD WITHOUT CONTRAST  CT MAXILLOFACIAL WITHOUT CONTRAST  CT CERVICAL SPINE WITHOUT CONTRAST  TECHNIQUE: Multidetector CT imaging of the head, cervical spine, and maxillofacial structures were performed using the standard protocol without intravenous contrast. Multiplanar CT image reconstructions of the cervical spine and maxillofacial structures were also generated.  COMPARISON:  Headache maxillofacial CT scans 03/12/2011.  FINDINGS: CT HEAD FINDINGS  Cortical atrophy and chronic microvascular ischemic change are again seen. Remote lacunar infarction in the left basal ganglia is unchanged in appearance. No acute abnormality including hemorrhage, infarct, mass lesion, mass effect, midline shift or abnormal extra-axial fluid collection is identified. There is no hydrocephalus or pneumocephalus. The calvarium is intact.  CT MAXILLOFACIAL FINDINGS  The patient has a acute bilateral nasal bone fractures with minimal impaction of the superior aspect of the right nasal bone identified. The facial bones are otherwise intact. The mandibular condyles are located with degenerative change seen. The patient is status post bilateral lens extraction. The globes are intact and orbital fat is clear. Soft tissue contusion above the nose and forehead is noted.  CT CERVICAL SPINE FINDINGS  Vertebral body height is maintained. There is mild reversal of the normal cervical lordosis. Multilevel degenerative disc disease appears worst at C5-6 and C6-7. There also has multilevel facet arthropathy. Paraspinous structures demonstrate carotid atherosclerosis. Lung apices are emphysematous but clear.  IMPRESSION: No acute abnormality cervical spine.  Bilateral nasal bone fractures with minimal depression of the  superior aspect of the right base above identified. Forehead soft tissue contusion is also seen.  Atrophy and chronic microvascular ischemic change.  Cervical spondylosis.  Emphysema.   Electronically Signed   By: Inge Rise M.D.   On: 04/06/2015 14:22   Ct Angio Chest Pe W/cm &/or Wo Cm  04/08/2015   CLINICAL DATA:  Hypoxia today. History of atrial fibrillation, mild aortic stenosis.  EXAM: CT ANGIOGRAPHY CHEST WITH CONTRAST  TECHNIQUE: Multidetector CT imaging of the chest was performed using the standard protocol during bolus administration of intravenous contrast. Multiplanar CT image reconstructions and MIPs were obtained to evaluate the vascular anatomy.  CONTRAST:  45mL OMNIPAQUE IOHEXOL 350 MG/ML SOLN  COMPARISON:  Chest radiograph April 08, 2015 at 2146 hours  FINDINGS: Adequate pulmonary arterial contrast opacification for assessment. Main pulmonary artery is not enlarged. No pulmonary arterial filling defect to the subsegmental branches. Heart size is mildly enlarged, accentuated by a mild pectus excavatum. No pericardial fluid collections. Thoracic aorta is normal in course and caliber with moderate to severe calcific atherosclerosis. Prominent mediastinal lymph nodes, measuring up to 12 mm at precarinal level.  Moderate to severe centrilobular emphysema. Patchy consolidation upper lobes bilaterally associated with small to moderate bilateral pleural effusions. Mildly enhancing atelectasis in lung bases bilaterally with superimposed patchy areas of hypoenhancement. Tracheobronchial tree is patent and midline, no pneumothorax.  Included view of the abdomen is nonsuspicious, splenic granulomas noted. Patient is osteopenic, mild degenerative change of thoracic spine. LEFT humeral arthroplasty.  Review of the MIP images confirms the above findings.  IMPRESSION: No acute pulmonary embolism.  Patchy consolidation consistent with multifocal pneumonia with small bilateral pleural effusions and lower  lobe atelectasis. Moderate to severe centrilobular emphysema.  Mild mediastinal lymphadenopathy is likely reactive.  Mild cardiomegaly.   Electronically Signed   By: Elon Alas   On: 04/08/2015 23:13   Ct Cervical Spine Wo Contrast  04/06/2015   CLINICAL DATA:  Dizziness and a fall today with a blow to the face and nose. Nosebleed, facial contusion and abrasion.  EXAM: CT HEAD WITHOUT CONTRAST  CT MAXILLOFACIAL WITHOUT CONTRAST  CT CERVICAL SPINE WITHOUT CONTRAST  TECHNIQUE: Multidetector CT imaging of the head, cervical spine, and maxillofacial structures were performed using the standard protocol without intravenous contrast. Multiplanar CT image reconstructions of the cervical spine and maxillofacial structures were also generated.  COMPARISON:  Headache maxillofacial CT scans 03/12/2011.  FINDINGS: CT HEAD FINDINGS  Cortical atrophy and chronic microvascular ischemic change are again seen. Remote lacunar infarction in the left basal ganglia is unchanged in appearance. No acute abnormality including hemorrhage, infarct, mass lesion, mass effect, midline shift or abnormal extra-axial fluid collection is identified. There is no hydrocephalus or pneumocephalus. The calvarium is intact.  CT MAXILLOFACIAL FINDINGS  The patient has a acute bilateral nasal bone fractures with minimal impaction of the superior aspect of the right nasal bone identified. The facial bones are otherwise intact. The mandibular condyles are located with degenerative change seen. The patient is status post bilateral lens extraction. The globes are intact and orbital fat is clear. Soft tissue contusion above the nose and forehead is noted.  CT CERVICAL SPINE FINDINGS  Vertebral body height is maintained. There is mild reversal of the normal cervical lordosis. Multilevel degenerative disc disease appears worst at C5-6 and C6-7. There also has multilevel facet arthropathy. Paraspinous structures demonstrate carotid atherosclerosis. Lung  apices are emphysematous but clear.  IMPRESSION: No acute abnormality cervical spine.  Bilateral nasal bone fractures with minimal depression of the superior aspect of the right base above identified. Forehead soft tissue contusion is also seen.  Atrophy and chronic microvascular ischemic change.  Cervical spondylosis.  Emphysema.   Electronically Signed   By: Inge Rise M.D.   On: 04/06/2015 14:22   Ct Maxillofacial Wo Cm  04/06/2015   CLINICAL DATA:  Dizziness and a fall today with a blow to the face and nose. Nosebleed, facial contusion and abrasion.  EXAM: CT HEAD WITHOUT CONTRAST  CT MAXILLOFACIAL WITHOUT CONTRAST  CT CERVICAL SPINE WITHOUT CONTRAST  TECHNIQUE: Multidetector CT imaging of the head, cervical spine, and maxillofacial structures were performed using the standard protocol without intravenous contrast. Multiplanar CT image reconstructions of the cervical spine and maxillofacial structures were also generated.  COMPARISON:  Headache maxillofacial CT scans 03/12/2011.  FINDINGS: CT HEAD FINDINGS  Cortical atrophy and chronic microvascular ischemic change are again seen. Remote lacunar infarction in the left basal ganglia is unchanged in appearance. No acute abnormality including hemorrhage, infarct, mass lesion, mass effect, midline shift or abnormal extra-axial fluid collection is identified. There is no hydrocephalus or pneumocephalus. The calvarium is intact.  CT MAXILLOFACIAL FINDINGS  The patient has a acute bilateral nasal bone fractures with minimal impaction of the superior aspect of the right nasal bone identified. The facial bones are otherwise intact. The mandibular condyles are located with degenerative change seen. The patient is status post bilateral lens extraction. The globes are intact and orbital fat is clear. Soft tissue contusion above the nose and forehead is noted.  CT CERVICAL SPINE FINDINGS  Vertebral body height is maintained. There is mild reversal of the normal  cervical lordosis. Multilevel degenerative disc disease appears worst at C5-6 and C6-7. There also has multilevel facet arthropathy. Paraspinous  structures demonstrate carotid atherosclerosis. Lung apices are emphysematous but clear.  IMPRESSION: No acute abnormality cervical spine.  Bilateral nasal bone fractures with minimal depression of the superior aspect of the right base above identified. Forehead soft tissue contusion is also seen.  Atrophy and chronic microvascular ischemic change.  Cervical spondylosis.  Emphysema.   Electronically Signed   By: Inge Rise M.D.   On: 04/06/2015 14:22     Medical Consultants:    None.  Anti-Infectives:    Azithromycin 04/08/15--->  Zosyn 04/09/15--->  Ceftriaxone 04/08/15 --->04/09/15  Subjective:   Kristine Simmons complains of left shoulder pain and decreased ROM since the fall.  She continues to deny dyspnea, has very little cough, but when she does have productive cough when she does.  Appetite still poor.  Last BM was yesterday, 3 soft stools.  Objective:    Filed Vitals:   04/12/15 0501 04/12/15 1402 04/12/15 2138 04/13/15 0453  BP: 152/77 129/69 140/80 143/78  Pulse: 95 64 65 67  Temp: 97.8 F (36.6 C) 97.8 F (36.6 C) 98.1 F (36.7 C) 98.3 F (36.8 C)  TempSrc: Oral Oral Oral Oral  Resp: 18 18 16 16   Height:      Weight:      SpO2: 92% 95% 97% 95%    Intake/Output Summary (Last 24 hours) at 04/13/15 0823 Last data filed at 04/12/15 1930  Gross per 24 hour  Intake    360 ml  Output    400 ml  Net    -40 ml    Exam: Gen:  NAD Head & Neck: Multiple facial ecchymosis.  Nose taped up.  Oxygen via [place]. Cardiovascular:  HSIR, III/VI SEM Respiratory:  Lungs CTAB Gastrointestinal:  Abdomen soft, NT/ND, + BS Extremities:  No C/E/C   Data Reviewed:    Labs: Basic Metabolic Panel:  Recent Labs Lab 04/09/15 0210 04/10/15 0352 04/11/15 0346 04/12/15 0350 04/13/15 0358  NA 137 133* 139 139 137  K 4.7 4.3 4.3  3.9 3.8  CL 98 95* 99 102 103  CO2 32 30 33* 30 31  GLUCOSE 124* 100* 89 112* 100*  BUN 16 16 13 13 11   CREATININE 0.92 0.91 0.88 0.86 0.92  CALCIUM 8.5 8.1* 8.4 8.5 8.4   GFR Estimated Creatinine Clearance: 40.3 mL/min (by C-G formula based on Cr of 0.92). Liver Function Tests:  Recent Labs Lab 04/09/15 0210 04/12/15 0350 04/13/15 0358  AST 154* 87* 64*  ALT 137* 116* 98*  ALKPHOS 47 39 38*  BILITOT 0.6 0.6 0.8  PROT 6.6 5.8* 5.7*  ALBUMIN 3.9 3.4* 3.3*   Coagulation profile  Recent Labs Lab 04/06/15 1248  INR 1.23    CBC:  Recent Labs Lab 04/06/15 1248 04/08/15 2135 04/10/15 0352 04/11/15 0346 04/12/15 0350  WBC 5.5 7.5 7.0 6.6 6.6  NEUTROABS 3.3 5.9  --   --   --   HGB 14.4 14.4 13.0 13.4 13.9  HCT 44.4 44.4 40.3 41.2 43.1  MCV 89.9 92.1 92.2 91.4 90.7  PLT 175 131* 142* 156 185   Cardiac Enzymes:  Recent Labs Lab 04/09/15 0220 04/09/15 0924 04/09/15 1515  TROPONINI 0.04* 0.03 0.03   BNP (last 3 results)  Recent Labs  11/18/14 0341  PROBNP 6363.0*   CBG:  Recent Labs Lab 04/09/15 0433  GLUCAP 110*   Microbiology Recent Results (from the past 240 hour(s))  MRSA PCR Screening     Status: None   Collection Time: 04/09/15  1:36 AM  Result Value Ref Range Status   MRSA by PCR NEGATIVE NEGATIVE Final    Comment:        The GeneXpert MRSA Assay (FDA approved for NASAL specimens only), is one component of a comprehensive MRSA colonization surveillance program. It is not intended to diagnose MRSA infection nor to guide or monitor treatment for MRSA infections.   Culture, blood (routine x 2) Call MD if unable to obtain prior to antibiotics being given     Status: None (Preliminary result)   Collection Time: 04/09/15  2:10 AM  Result Value Ref Range Status   Specimen Description BLOOD LEFT ARM  Final   Special Requests   Final    BOTTLES DRAWN AEROBIC AND ANAEROBIC 10CC AER, 6CC ANA   Culture   Final           BLOOD CULTURE  RECEIVED NO GROWTH TO DATE CULTURE WILL BE HELD FOR 5 DAYS BEFORE ISSUING A FINAL NEGATIVE REPORT Performed at [facility]    Report Status PENDING  Incomplete  Culture, blood (routine x 2) Call MD if unable to obtain prior to antibiotics being given     Status: None (Preliminary result)   Collection Time: 04/09/15  2:20 AM  Result Value Ref Range Status   Specimen Description BLOOD LEFT HAND  Final   Special Requests BOTTLES DRAWN AEROBIC ONLY 6CC  Final   Culture   Final           BLOOD CULTURE RECEIVED NO GROWTH TO DATE CULTURE WILL BE HELD FOR 5 DAYS BEFORE ISSUING A FINAL NEGATIVE REPORT Performed at [facility]    Report Status PENDING  Incomplete     Medications:   . apixaban  2.5 mg Oral BID  . azithromycin  500 mg Intravenous Q24H  . digoxin  0.125 mg Oral Daily  . diltiazem  120 mg Oral Daily  . FLORA-Q  1 capsule Oral Daily  . guaiFENesin  600 mg Oral BID  . latanoprost  1 drop Both Eyes QHS  . levothyroxine  50 mcg Oral QAC breakfast  . piperacillin-tazobactam (ZOSYN)  IV  3.375 g Intravenous 3 times per day  . polyethylene glycol  17 g Oral Daily  . sertraline  25 mg Oral Daily  . timolol  1 drop Both Eyes BID  . trimethoprim  100 mg Oral Q48H   Continuous Infusions:   Time spent: 25 minutes.   LOS: 5 days   [facility] Pager [phone]. If unable to reach me by pager, please call my cell phone at [phone].  *Please refer to [email or web address], password [email or web address] to get updated schedule on who will round on this patient, as hospitalists switch teams weekly. If 7PM-7AM, please contact night-coverage at [email or web address], password [email or web address] for any overnight needs.  04/13/2015, 8:23 AM

## 2015-04-13 NOTE — Care Management Note (Addendum)
    Page 1 of 2   04/14/2015     2:00:43 PM CARE MANAGEMENT NOTE 04/14/2015  Patient:  Kristine Simmons, Kristine Simmons   Account Number:  [id number]  Date Initiated:  04/10/2015  Documentation initiated by:  DAVIS,RHONDA  Subjective/Objective Assessment:   hypoxia     Action/Plan:   home when stable   Anticipated DC Date:  04/14/2015   Anticipated DC Plan:  [place] referral  NA      DC Planning Services  CM consult      [facility] Choice  NA   Choice offered to / List presented to:  C-1 Patient   DME arranged  [facility]      DME agency  [place] arranged  [place]   Status of service:  Completed, signed off Medicare Important Message given?  YES (If response is "NO", the following Medicare IM given date fields will be blank) Date Medicare IM given:  04/13/2015 Medicare IM given by:  [facility] Date Additional Medicare IM given:   Additional Medicare IM given by:    Discharge Disposition:  [place]  Per UR Regulation:  Reviewed for med. necessity/level of care/duration of stay  If discussed at [place] of Stay Meetings, dates discussed:   04/13/2015    Comments:  04/14/15 Dessa Phi RN BSN NCM [phone] received home 02 order,travel brought to patient's rm by [facility] dme rep.No further d/c needs. [place] Go Tim aware of orders, & d/c today. Noted 02 sats,Will check if MD wants home 02 more often than HS. 04/12/15 Dessa Phi RN BSN  NCM [phone] PT-HH.[facility] chosen for HHPT.TC [facility] rep Tim aware of HHPT order.Await d/c order.Already receives home 02-[facility] @ HS. If needed continuous can arrange if qualifies & ordered.  [place], RN, BSN, [place] [phone] Chart Reviewed. Discharge needs at time of review: None present will follow for needs. Chart note for progression:

## 2015-04-13 NOTE — Progress Notes (Signed)
SATURATION QUALIFICATIONS: (This note is used to comply with regulatory documentation for home oxygen)  Patient Saturations on Room Air at Rest = 88%  Patient Saturations on Room Air while Ambulating = 85%  Patient Saturations on 2 Liters of oxygen while Ambulating = 93%   [place], [place] Pager: [phone] 04/13/2015

## 2015-04-14 MED ORDER — TRAMADOL HCL 50 MG PO TABS: 50.0000 mg | ORAL_TABLET | Freq: Two times a day (BID) | ORAL | Status: DC | PRN

## 2015-04-14 NOTE — Discharge Summary (Signed)
Physician Discharge Summary  Kristine Simmons MGQ:[id number] DOB: [birth year] DOA: 04/08/2015  PCP: Unice Cobble, MD  Admit date: 04/08/2015 Discharge date: 04/14/2015   Recommendations for Outpatient Follow-Up:   1. Patient was discharged home with home health services. 2. Has follow-up appointment with Dr. Linna Darner scheduled for 04/19/15. Please check LFTs at that visit to ensure that they have normalized.   Discharge Diagnosis:   Principal Problem:    Aspiration pneumonia Active Problems:    Hypothyroidism    Atrial fibrillation    COPD, Gold grade A    Cough    Chronic anticoagulation    Hypoxemia    Dysphagia, pharyngoesophageal phase    CAP (community acquired pneumonia)    Acute on chronic respiratory failure    Elevated LFTs   Discharge disposition:  Home with home health services for physical therapy  Discharge Condition: Improved.  Diet recommendation: Low sodium, heart healthy.    Wound care: None.   History of Present Illness:   Kristine Simmons is an 81 y.o. female witha PMH of chronic aspiration, followed by pulmonology, chronic respiratory failure requiring oxygen at night, chronic cough with lung scarring who was brought in for evaluation of hypoxia after her husband found her with altered mental status and checked her pulse oximetry with a home meter. Of note, she had a recent fall while working at the yard she lost her balance and fell hitting her face. She was seen in the ED 04/06/15 and was noted to have nasal bridge fracture. CT Angio chest showed multilobar pulmonary pneumonia, no evidence of PE.   Hospital Course by Problem:   Principal problem:  Community-acquired pneumonia versus aspiration pneumonia - Patient has history of chronic aspiration, s/p SLP evaluation, continue with regular diet -Status post 7 days of treatment with IV Zosyn and azithromycin. -Blood culture: No growth to date.  Active problems:  Left shoulder  pain - Likely from trauma/fall. Shoulder films negative for fracture/dislocation.  Elevated LFTs - Trending down. Monitor.  COPD - No wheezing, so no indication for steroids, continue with nebulizer treatment.  Acute on chronic hypoxic respiratory failure - At baseline patient is on 2 L nasal cannula at bedtime, still requiring oxygen daytime.  - Home oxygen set up for continuous therapy.  Atrial fibrillation - Heart rate controlled on Cardizem and digoxin. - Patient is on Eliquis for anticoagulation, patient had one fall in so many years, for now continue with anticoagulation. - ChadVAsc >2.  Hypothyroidism - Continue with Synthroid.    Medical Consultants:    None.   Discharge Exam:   Filed Vitals:   04/14/15 1048  BP: 129/66  Pulse: 61  Temp:   Resp:    Filed Vitals:   04/14/15 0425 04/14/15 1048 04/14/15 1054 04/14/15 1055  BP: 137/80 129/66    Pulse: 78 61    Temp: 98.1 F (36.7 C)     TempSrc: Oral     Resp: 16     Height:      Weight:      SpO2: 95% 93% 85% 95%    Gen:  NAD Cardiovascular:  RRR, No M/R/G Respiratory: Lungs CTAB Gastrointestinal: Abdomen soft, NT/ND with normal active bowel sounds. Extremities: No C/E/C   The results of significant diagnostics from this hospitalization (including imaging, microbiology, ancillary and laboratory) are listed below for reference.     Procedures and Diagnostic Studies:   Dg Chest 2 View  04/08/2015   CLINICAL DATA:  Patient with altered mental  status.  EXAM: CHEST  2 VIEW  COMPARISON:  Chest radiograph 11/25/2014  FINDINGS: Monitoring leads overlie the patient. Stable enlarged cardiac and mediastinal contours. Emphysematous change. Biapical scarring. Interval development of left-greater-than-right mid lung heterogeneous opacities. Additionally there are new consolidative opacities within the left lower lung. Suggestion of small bilateral pleural effusions.  IMPRESSION: New focal retrocardiac  consolidation may represent pneumonia in the appropriate clinical setting. There are small bilateral pleural effusions. Additional heterogeneous opacities within the bilateral mid lungs may represent infection and/or possible edema. Recommend short term radiographic followup.   Electronically Signed   By: Lovey Newcomer M.D.   On: 04/08/2015 22:05   Ct Head Wo Contrast  04/08/2015   CLINICAL DATA:  Patient with altered mental status.  EXAM: CT HEAD WITHOUT CONTRAST  TECHNIQUE: Contiguous axial images were obtained from the base of the skull through the vertex without intravenous contrast.  COMPARISON:  Brain CT 04/06/2015  FINDINGS: Ventricles and sulci are appropriate for patient's age. Re- demonstrated chronic left basal ganglia infarct. No evidence for acute cortically based infarct, intracranial hemorrhage, mass lesion or mass-effect. Periventricular and subcortical white matter hypodensity compatible with chronic small vessel ischemic changes. Bilateral cataract surgery. Paranasal sinuses are well aerated. Mastoid air cells unremarkable. Calvarium is intact.  IMPRESSION: No acute intracranial process.  Chronic small vessel ischemic changes.  Remote lacunar infarct within the left basal ganglia.   Electronically Signed   By: Lovey Newcomer M.D.   On: 04/08/2015 22:11   Ct Head Wo Contrast  04/06/2015   CLINICAL DATA:  Dizziness and a fall today with a blow to the face and nose. Nosebleed, facial contusion and abrasion.  EXAM: CT HEAD WITHOUT CONTRAST  CT MAXILLOFACIAL WITHOUT CONTRAST  CT CERVICAL SPINE WITHOUT CONTRAST  TECHNIQUE: Multidetector CT imaging of the head, cervical spine, and maxillofacial structures were performed using the standard protocol without intravenous contrast. Multiplanar CT image reconstructions of the cervical spine and maxillofacial structures were also generated.  COMPARISON:  Headache maxillofacial CT scans 03/12/2011.  FINDINGS: CT HEAD FINDINGS  Cortical atrophy and chronic  microvascular ischemic change are again seen. Remote lacunar infarction in the left basal ganglia is unchanged in appearance. No acute abnormality including hemorrhage, infarct, mass lesion, mass effect, midline shift or abnormal extra-axial fluid collection is identified. There is no hydrocephalus or pneumocephalus. The calvarium is intact.  CT MAXILLOFACIAL FINDINGS  The patient has a acute bilateral nasal bone fractures with minimal impaction of the superior aspect of the right nasal bone identified. The facial bones are otherwise intact. The mandibular condyles are located with degenerative change seen. The patient is status post bilateral lens extraction. The globes are intact and orbital fat is clear. Soft tissue contusion above the nose and forehead is noted.  CT CERVICAL SPINE FINDINGS  Vertebral body height is maintained. There is mild reversal of the normal cervical lordosis. Multilevel degenerative disc disease appears worst at C5-6 and C6-7. There also has multilevel facet arthropathy. Paraspinous structures demonstrate carotid atherosclerosis. Lung apices are emphysematous but clear.  IMPRESSION: No acute abnormality cervical spine.  Bilateral nasal bone fractures with minimal depression of the superior aspect of the right base above identified. Forehead soft tissue contusion is also seen.  Atrophy and chronic microvascular ischemic change.  Cervical spondylosis.  Emphysema.   Electronically Signed   By: Inge Rise M.D.   On: 04/06/2015 14:22   Ct Angio Chest Pe W/cm &/or Wo Cm  04/08/2015   CLINICAL DATA:  Hypoxia today.  History of atrial fibrillation, mild aortic stenosis.  EXAM: CT ANGIOGRAPHY CHEST WITH CONTRAST  TECHNIQUE: Multidetector CT imaging of the chest was performed using the standard protocol during bolus administration of intravenous contrast. Multiplanar CT image reconstructions and MIPs were obtained to evaluate the vascular anatomy.  CONTRAST:  70mL OMNIPAQUE IOHEXOL 350 MG/ML  SOLN  COMPARISON:  Chest radiograph April 08, 2015 at 2146 hours  FINDINGS: Adequate pulmonary arterial contrast opacification for assessment. Main pulmonary artery is not enlarged. No pulmonary arterial filling defect to the subsegmental branches. Heart size is mildly enlarged, accentuated by a mild pectus excavatum. No pericardial fluid collections. Thoracic aorta is normal in course and caliber with moderate to severe calcific atherosclerosis. Prominent mediastinal lymph nodes, measuring up to 12 mm at precarinal level.  Moderate to severe centrilobular emphysema. Patchy consolidation upper lobes bilaterally associated with small to moderate bilateral pleural effusions. Mildly enhancing atelectasis in lung bases bilaterally with superimposed patchy areas of hypoenhancement. Tracheobronchial tree is patent and midline, no pneumothorax.  Included view of the abdomen is nonsuspicious, splenic granulomas noted. Patient is osteopenic, mild degenerative change of thoracic spine. LEFT humeral arthroplasty.  Review of the MIP images confirms the above findings.  IMPRESSION: No acute pulmonary embolism.  Patchy consolidation consistent with multifocal pneumonia with small bilateral pleural effusions and lower lobe atelectasis. Moderate to severe centrilobular emphysema.  Mild mediastinal lymphadenopathy is likely reactive.  Mild cardiomegaly.   Electronically Signed   By: Elon Alas   On: 04/08/2015 23:13   Ct Cervical Spine Wo Contrast  04/06/2015   CLINICAL DATA:  Dizziness and a fall today with a blow to the face and nose. Nosebleed, facial contusion and abrasion.  EXAM: CT HEAD WITHOUT CONTRAST  CT MAXILLOFACIAL WITHOUT CONTRAST  CT CERVICAL SPINE WITHOUT CONTRAST  TECHNIQUE: Multidetector CT imaging of the head, cervical spine, and maxillofacial structures were performed using the standard protocol without intravenous contrast. Multiplanar CT image reconstructions of the cervical spine and maxillofacial  structures were also generated.  COMPARISON:  Headache maxillofacial CT scans 03/12/2011.  FINDINGS: CT HEAD FINDINGS  Cortical atrophy and chronic microvascular ischemic change are again seen. Remote lacunar infarction in the left basal ganglia is unchanged in appearance. No acute abnormality including hemorrhage, infarct, mass lesion, mass effect, midline shift or abnormal extra-axial fluid collection is identified. There is no hydrocephalus or pneumocephalus. The calvarium is intact.  CT MAXILLOFACIAL FINDINGS  The patient has a acute bilateral nasal bone fractures with minimal impaction of the superior aspect of the right nasal bone identified. The facial bones are otherwise intact. The mandibular condyles are located with degenerative change seen. The patient is status post bilateral lens extraction. The globes are intact and orbital fat is clear. Soft tissue contusion above the nose and forehead is noted.  CT CERVICAL SPINE FINDINGS  Vertebral body height is maintained. There is mild reversal of the normal cervical lordosis. Multilevel degenerative disc disease appears worst at C5-6 and C6-7. There also has multilevel facet arthropathy. Paraspinous structures demonstrate carotid atherosclerosis. Lung apices are emphysematous but clear.  IMPRESSION: No acute abnormality cervical spine.  Bilateral nasal bone fractures with minimal depression of the superior aspect of the right base above identified. Forehead soft tissue contusion is also seen.  Atrophy and chronic microvascular ischemic change.  Cervical spondylosis.  Emphysema.   Electronically Signed   By: Inge Rise M.D.   On: 04/06/2015 14:22   Dg Shoulder Left  04/13/2015   CLINICAL DATA:  Recent fall with  persistent left shoulder pain, initial encounter  EXAM: LEFT SHOULDER - 2+ VIEW  COMPARISON:  03/12/2006  FINDINGS: There are changes consistent with left shoulder arthroplasty. Degenerative changes of the acromioclavicular joint are seen. No  acute fracture or dislocation is noted. No gross soft tissue abnormality is noted.  IMPRESSION: Postsurgical changes without acute abnormality.   Electronically Signed   By: Inez Catalina M.D.   On: 04/13/2015 12:01   Ct Maxillofacial Wo Cm  04/06/2015   CLINICAL DATA:  Dizziness and a fall today with a blow to the face and nose. Nosebleed, facial contusion and abrasion.  EXAM: CT HEAD WITHOUT CONTRAST  CT MAXILLOFACIAL WITHOUT CONTRAST  CT CERVICAL SPINE WITHOUT CONTRAST  TECHNIQUE: Multidetector CT imaging of the head, cervical spine, and maxillofacial structures were performed using the standard protocol without intravenous contrast. Multiplanar CT image reconstructions of the cervical spine and maxillofacial structures were also generated.  COMPARISON:  Headache maxillofacial CT scans 03/12/2011.  FINDINGS: CT HEAD FINDINGS  Cortical atrophy and chronic microvascular ischemic change are again seen. Remote lacunar infarction in the left basal ganglia is unchanged in appearance. No acute abnormality including hemorrhage, infarct, mass lesion, mass effect, midline shift or abnormal extra-axial fluid collection is identified. There is no hydrocephalus or pneumocephalus. The calvarium is intact.  CT MAXILLOFACIAL FINDINGS  The patient has a acute bilateral nasal bone fractures with minimal impaction of the superior aspect of the right nasal bone identified. The facial bones are otherwise intact. The mandibular condyles are located with degenerative change seen. The patient is status post bilateral lens extraction. The globes are intact and orbital fat is clear. Soft tissue contusion above the nose and forehead is noted.  CT CERVICAL SPINE FINDINGS  Vertebral body height is maintained. There is mild reversal of the normal cervical lordosis. Multilevel degenerative disc disease appears worst at C5-6 and C6-7. There also has multilevel facet arthropathy. Paraspinous structures demonstrate carotid atherosclerosis. Lung  apices are emphysematous but clear.  IMPRESSION: No acute abnormality cervical spine.  Bilateral nasal bone fractures with minimal depression of the superior aspect of the right base above identified. Forehead soft tissue contusion is also seen.  Atrophy and chronic microvascular ischemic change.  Cervical spondylosis.  Emphysema.   Electronically Signed   By: Inge Rise M.D.   On: 04/06/2015 14:22     Labs:   Basic Metabolic Panel:  Recent Labs Lab 04/09/15 0210 04/10/15 0352 04/11/15 0346 04/12/15 0350 04/13/15 0358  NA 137 133* 139 139 137  K 4.7 4.3 4.3 3.9 3.8  CL 98 95* 99 102 103  CO2 32 30 33* 30 31  GLUCOSE 124* 100* 89 112* 100*  BUN 16 16 13 13 11   CREATININE 0.92 0.91 0.88 0.86 0.92  CALCIUM 8.5 8.1* 8.4 8.5 8.4   GFR Estimated Creatinine Clearance: 40.3 mL/min (by C-G formula based on Cr of 0.92). Liver Function Tests:  Recent Labs Lab 04/09/15 0210 04/12/15 0350 04/13/15 0358  AST 154* 87* 64*  ALT 137* 116* 98*  ALKPHOS 47 39 38*  BILITOT 0.6 0.6 0.8  PROT 6.6 5.8* 5.7*  ALBUMIN 3.9 3.4* 3.3*   CBC:  Recent Labs Lab 04/08/15 2135 04/10/15 0352 04/11/15 0346 04/12/15 0350  WBC 7.5 7.0 6.6 6.6  NEUTROABS 5.9  --   --   --   HGB 14.4 13.0 13.4 13.9  HCT 44.4 40.3 41.2 43.1  MCV 92.1 92.2 91.4 90.7  PLT 131* 142* 156 185   Cardiac Enzymes:  Recent Labs Lab 04/09/15 0220 04/09/15 0924 04/09/15 1515  TROPONINI 0.04* 0.03 0.03   CBG:  Recent Labs Lab 04/09/15 0433  GLUCAP 110*   Microbiology Recent Results (from the past 240 hour(s))  MRSA PCR Screening     Status: None   Collection Time: 04/09/15  1:36 AM  Result Value Ref Range Status   MRSA by PCR NEGATIVE NEGATIVE Final    Comment:        The GeneXpert MRSA Assay (FDA approved for NASAL specimens only), is one component of a comprehensive MRSA colonization surveillance program. It is not intended to diagnose MRSA infection nor to guide or monitor treatment  for MRSA infections.   Culture, blood (routine x 2) Call MD if unable to obtain prior to antibiotics being given     Status: None (Preliminary result)   Collection Time: 04/09/15  2:10 AM  Result Value Ref Range Status   Specimen Description BLOOD LEFT ARM  Final   Special Requests   Final    BOTTLES DRAWN AEROBIC AND ANAEROBIC 10CC AER, 6CC ANA   Culture   Final           BLOOD CULTURE RECEIVED NO GROWTH TO DATE CULTURE WILL BE HELD FOR 5 DAYS BEFORE ISSUING A FINAL NEGATIVE REPORT Performed at [facility]    Report Status PENDING  Incomplete  Culture, blood (routine x 2) Call MD if unable to obtain prior to antibiotics being given     Status: None (Preliminary result)   Collection Time: 04/09/15  2:20 AM  Result Value Ref Range Status   Specimen Description BLOOD LEFT HAND  Final   Special Requests BOTTLES DRAWN AEROBIC ONLY 6CC  Final   Culture   Final           BLOOD CULTURE RECEIVED NO GROWTH TO DATE CULTURE WILL BE HELD FOR 5 DAYS BEFORE ISSUING A FINAL NEGATIVE REPORT Performed at [facility]    Report Status PENDING  Incomplete     Discharge Instructions:       Discharge Instructions    Call MD for:  difficulty breathing, headache or visual disturbances    Complete by:  As directed      Call MD for:  extreme fatigue    Complete by:  As directed      Call MD for:  temperature >100.4    Complete by:  As directed      Diet - low sodium heart healthy    Complete by:  As directed      Discharge instructions    Complete by:  As directed   You were cared for by Dr. Jacquelynn Cree  (a hospitalist) during your hospital stay. If you have any questions about your discharge medications or the care you received while you were in the hospital after you are discharged, you can call the unit and ask to speak with the hospitalist on call if the hospitalist that took care of you is not available. Once you are discharged, your primary care physician will handle any  further medical issues. Please note that NO REFILLS for any discharge medications will be authorized once you are discharged, as it is imperative that you return to your primary care physician (or establish a relationship with a primary care physician if you do not have one) for your aftercare needs so that they can reassess your need for medications and monitor your lab values.  Any outstanding tests can be reviewed by your  PCP at your follow up visit.  It is also important to review any medicine changes with your PCP.  Please bring these d/c instructions with you to your next visit so your physician can review these changes with you.  You were treated for pneumonia while you were in the hospital.  It is important that you see your PCP in follow up, and have him/her order a follow up chest x-ray in 4-6 weeks to ensure resolution of the pneumonia and to exclude any underlying pathology.     Face-to-face encounter (required for Medicare/Medicaid patients)    Complete by:  As directed   I RAMA,CHRISTINA certify that this patient is under my care and that I, or a nurse practitioner or physician's assistant working with me, had a face-to-face encounter that meets the physician face-to-face encounter requirements with this patient on 04/14/2015. The encounter with the patient was in whole, or in part for the following medical condition(s) which is the primary reason for home health care (List medical condition): Weakness, recent fall, hospitalized for treatment of PNA.  The encounter with the patient was in whole, or in part, for the following medical condition, which is the primary reason for home health care:  Fall, weakness  I certify that, based on my findings, the following services are medically necessary home health services:  Physical therapy  Reason for Medically Necessary Home Health Services:  Therapy- Therapeutic Exercises to Increase Strength and Endurance  My clinical findings support the need for  the above services:  Unable to leave home safely without assistance and/or assistive device  Further, I certify that my clinical findings support that this patient is homebound due to:  Unable to leave home safely without assistance     Home Health    Complete by:  As directed   To provide the following care/treatments:  PT     Increase activity slowly    Complete by:  As directed             Medication List    TAKE these medications        acetaminophen 325 MG tablet  Commonly known as:  TYLENOL  You can take 2 tablets every 4 hours as needed.  You cannot take more than 4000 mg of tylenol per day.  This is also in your sleep aide, and it is in your prescribed pain medicine. So you need to figure out.  Do not take more than 12 tablets of tylenol (acetaminophen) per day.     apixaban 2.5 MG Tabs tablet  Commonly known as:  ELIQUIS  Take 1 tablet (2.5 mg total) by mouth 2 (two) times daily.     calcium carbonate 500 MG chewable tablet  Commonly known as:  TUMS - dosed in mg elemental calcium  Chew 2 tablets by mouth daily as needed for indigestion or heartburn (indigestion).     calcium-vitamin D 500-200 MG-UNIT per tablet  Take 1 tablet by mouth daily.     CRANBERRY CONCENTRATE 500 MG Caps  Generic drug:  Cranberry  Take 1 capsule by mouth 2 (two) times daily.     digoxin 0.125 MG tablet  Commonly known as:  LANOXIN  Take 0.125 mg by mouth daily.     diltiazem 120 MG 24 hr capsule  Commonly known as:  CARDIZEM CD  TAKE (1) CAPSULE DAILY.     diphenhydramine-acetaminophen 25-500 MG Tabs  Commonly known as:  TYLENOL PM  Take 2 tablets by mouth at bedtime  as needed (sleep).     ENSURE PLUS Liqd  Take 1 Can by mouth daily.     estradiol 0.1 MG/GM vaginal cream  Commonly known as:  ESTRACE  Place 2 g vaginally 2 (two) times a week. On Wednesdays & Saturdays.     levothyroxine 50 MCG tablet  Commonly known as:  SYNTHROID, LEVOTHROID  TAKE 1 TABLET ONCE DAILY.      LORazepam 1 MG tablet  Commonly known as:  ATIVAN  TAKE 1 TABLET EVERY 8-12 HOURS AS NEEDED. CAN BE HABIT FORMING & MAY AFFECT MENTAL ALERTNESS.     LUMIGAN 0.01 % Soln  Generic drug:  bimatoprost  Place 1 drop into both eyes at bedtime.     Magnesium Oxide 500 MG Tabs  Take 1 tablet by mouth at bedtime.     Misc Intestinal Flora Regulat Caps  Take 1 capsule by mouth daily.     oxyCODONE-acetaminophen 5-325 MG per tablet  Commonly known as:  PERCOCET/ROXICET  1/2-1 tab by mouth every 4 hours when necessary for pain     sertraline 50 MG tablet  Commonly known as:  ZOLOFT  TAKE 1/2 TABLET DAILY FOR FIRST 10 DAYS, THEN INCREASE TO 1 TABLET DAILY.     timolol 0.5 % ophthalmic solution  Commonly known as:  TIMOPTIC  Place 1 drop into both eyes 2 (two) times daily.     traMADol 50 MG tablet  Commonly known as:  ULTRAM  Take 1 tablet (50 mg total) by mouth every 12 (twelve) hours as needed for moderate pain.     trimethoprim 100 MG tablet  Commonly known as:  TRIMPEX  Take 100 mg by mouth every other day.     Vitamin B Complex Tabs  Take 1 tablet by mouth daily.     Vitamin D 2000 UNITS tablet  Take 2,000 Units by mouth daily.     VYTORIN 10-40 MG per tablet  Generic drug:  ezetimibe-simvastatin  TAKE 1 TABLET ONCE DAILY.       Follow-up Information    Follow up with [facility].   Why:  HHPT   Contact information:   [place] [address] [phone]       Follow up with Unice Cobble, MD. Go on 04/19/2015.   Specialty:  Internal Medicine   Why:  Appointment time is 2:00pm , it is for a post-hospital follow up for PNA and Fall .    Contact information:   [address] [address] [phone]       Follow up with [facility].   Why:  home 0xygen   Contact information:   [address] [place] [address] [phone]        Time coordinating discharge: 35  minutes.  Signed:  RAMA,CHRISTINA  Pager [phone] [facility] 04/14/2015, 3:27 PM

## 2015-04-14 NOTE — Progress Notes (Signed)
SATURATION QUALIFICATIONS: (This note is used to comply with regulatory documentation for home oxygen)  Patient Saturations on Room Air at Rest = 93%  Patient Saturations on Room Air while Ambulating = 85%  Patient Saturations on 2Liters of oxygen while Ambulating = 95%  Please briefly explain why patient needs home oxygen: 

## 2015-04-14 NOTE — Discharge Instructions (Signed)
Aspiration Pneumonia °Aspiration pneumonia is an infection in your lungs. It occurs when food, liquid, or stomach contents (vomit) are inhaled (aspirated) into your lungs. When these things get into your lungs, swelling (inflammation) and infection can occur. This can make it difficult for you to breathe. Aspiration pneumonia is a serious condition and can be life threatening. °RISK FACTORS °Aspiration pneumonia is more likely to occur when a person's cough (gag) reflex or ability to swallow has been decreased. Some things that can do this include:  °· Having a brain injury or disease, such as stroke, seizures, Parkinson's disease, dementia, or amyotrophic lateral sclerosis (ALS).   °· Being given general anesthetic for procedures.   °· Being in a coma (unconscious).   °· Having a narrowing of the tube that carries food to the stomach (esophagus).   °· Drinking too much alcohol. If a person passes out and vomits, vomit can be swallowed into the lungs.   °· Taking certain medicines, such as tranquilizers or sedatives.   °SIGNS AND SYMPTOMS  °· Coughing after swallowing food or liquids.   °· Breathing problems, such as wheezing or shortness of breath.   °· Bluish skin. This can be caused by lack of oxygen.   °· Coughing up food or mucus. The mucus might contain blood, greenish material, or yellowish-white fluid (pus).   °· Fever.   °· Chest pain.   °· Being more tired than usual (fatigue).   °· Sweating more than usual.   °· Bad breath.   °DIAGNOSIS  °A physical exam will be done. During the exam, the health care provider will listen to your lungs with a stethoscope to check for:  °· Crackling sounds in the lungs. °· Decreased breath sounds. °· A rapid heartbeat. °Various tests may be ordered. These may include:  °· Chest X-ray.   °· CT scan.   °· Swallowing study. This test looks at how food is swallowed and whether it goes into your breathing tube (trachea) or food pipe (esophagus).   °· Sputum culture. Saliva and  mucus (sputum) are collected from the lungs or the tubes that carry air to the lungs (bronchi). The sputum is then tested for bacteria.   °· Bronchoscopy. This test uses a flexible tube (bronchoscope) to see inside the lungs. °TREATMENT  °Treatment will usually include antibiotic medicines. Other medicines may also be used to reduce fever or pain. You may need to be treated in the hospital. In the hospital, your breathing will be carefully monitored. Depending on how well you are breathing, you may need to be given oxygen, or you may need breathing support from a breathing machine (ventilator). For people who fail a swallowing study, a feeding tube might be placed in the stomach, or they may be asked to avoid certain food textures or liquids when they eat. °HOME CARE INSTRUCTIONS  °· Carefully follow any special eating instructions you were given, such as avoiding certain food textures or thickening liquids. This reduces the risk of developing aspiration pneumonia again.  °· Only take over-the-counter or prescription medicines as directed by your health care provider. Follow the directions carefully.   °· If you were prescribed antibiotics, take them as directed. Finish them even if you start to feel better.   °· Rest as instructed by your health care provider.   °· Keep all follow-up appointments with your health care provider.   °SEEK MEDICAL CARE IF:  °· You develop worsening shortness of breath, wheezing, or difficulty breathing.   °· You develop a fever.   °· You have chest pain.   °MAKE SURE YOU:  °· Understand these instructions. °· Will watch your   condition. °· Will get help right away if you are not doing well or get worse. °Document Released: 10/06/2009 Document Revised: 12/14/2013 Document Reviewed: 05/27/2013 °[employer]® Patient Information ©2015 [employer]. This information is not intended to replace advice given to you by your health care provider. Make sure you discuss any questions you have with  your health care provider. ° °

## 2015-04-15 LAB — CULTURE, BLOOD (ROUTINE X 2)
Culture: NO GROWTH
Culture: NO GROWTH

## 2015-04-19 ENCOUNTER — Other Ambulatory Visit (INDEPENDENT_AMBULATORY_CARE_PROVIDER_SITE_OTHER): Payer: Medicare Other

## 2015-04-19 ENCOUNTER — Ambulatory Visit (INDEPENDENT_AMBULATORY_CARE_PROVIDER_SITE_OTHER): Payer: Medicare Other | Admitting: Internal Medicine

## 2015-04-19 ENCOUNTER — Telehealth: Payer: Self-pay | Admitting: Internal Medicine

## 2015-04-19 ENCOUNTER — Encounter: Payer: Self-pay | Admitting: Internal Medicine

## 2015-04-19 VITALS — BP 120/68 | HR 63 | Temp 97.7°F | Resp 16 | Ht 66.0 in | Wt 124.2 lb

## 2015-04-19 DIAGNOSIS — R748 Abnormal levels of other serum enzymes: Secondary | ICD-10-CM

## 2015-04-19 DIAGNOSIS — M7542 Impingement syndrome of left shoulder: Secondary | ICD-10-CM | POA: Diagnosis not present

## 2015-04-19 DIAGNOSIS — J690 Pneumonitis due to inhalation of food and vomit: Secondary | ICD-10-CM | POA: Diagnosis not present

## 2015-04-19 DIAGNOSIS — R194 Change in bowel habit: Secondary | ICD-10-CM

## 2015-04-19 LAB — HEPATIC FUNCTION PANEL
ALT: 66 U/L — ABNORMAL HIGH (ref 0–35)
AST: 40 U/L — ABNORMAL HIGH (ref 0–37)
Albumin: 4.1 g/dL (ref 3.5–5.2)
Alkaline Phosphatase: 55 U/L (ref 39–117)
Bilirubin, Direct: 0.1 mg/dL (ref 0.0–0.3)
TOTAL PROTEIN: 6.4 g/dL (ref 6.0–8.3)
Total Bilirubin: 0.6 mg/dL (ref 0.2–1.2)

## 2015-04-19 MED ORDER — SERTRALINE HCL 25 MG PO TABS: 25.0000 mg | ORAL_TABLET | Freq: Every day | ORAL | Status: DC

## 2015-04-19 NOTE — Telephone Encounter (Signed)
Called Kristine Simmons no answer LMOM with md response...Kristine Simmons

## 2015-04-19 NOTE — Progress Notes (Signed)
Pre visit review using our clinic review tool, if applicable. No additional management support is needed unless otherwise documented below in the visit note. 

## 2015-04-19 NOTE — Progress Notes (Signed)
   Subjective:    Patient ID: Kristine Simmons, female    DOB: November 07, 1927, 81 y.o.   MRN: [id number]  HPI The hospital records 4/16-4/22/16 were reviewed. She was hospitalized with aspiration pneumonia which presented as altered mental status. This is superimposed on COPD GOLD Grade A.  4 days prior to that admission she had fallen and fractured her nose. This was related to imbalance rather than associated with any cardiac or neurologic prodrome.  She had been on oxygen only at night prior to this hospitalization. She is now on it 24 hours a day. They have checked oxygen saturation off oxygen for several hours. The low was 88%. There's been no ambulatory oxygen saturation monitor.  In the hospital she was found to have elevated liver enzymes. ALT was 116 and AST 87. At the time of discharge the values were 98 and 64.  Since discharge she's not had fever or chills. She continues to have some difficulty swallowing and will be working with occupational or physical therapy concerning the dysphagia. All sputum is clear.She has had clear rhinitis since 4/27.    Review of Systems  Denied were any change in heart rhythm or rate prior to the event. There was no associated chest pain or shortness of breath . Also specifically denied prior to the episode were headache, limb weakness, tingling, or numbness. No seizure activity noted. Frontal headache, facial pain , nasal purulence, dental pain, sore throat , otic pain or otic discharge denied. Other extrinsic symptoms of itchy, watery eyes, or angioedema are denied. There is no wheezing or  paroxysmal nocturnal dyspnea. She has chronic pain & decreased ROM of L shoulder. Dr Durward Fortes had operated on that shoulder remotely.She does not want to pursue Orthopedic evaluation. Since discharge stools have been loose.     Objective:   Physical Exam Pertinent or positive findings include: Ptosis OS. Faint bruising of face , especially L cheek. Hearing aids  bilaterally. Trace ankle edema. Decreased pedal pulses. Decreased ROM LUE due to pain.  General appearance :Thin but adequately nourished; in no distress. Eyes: No conjunctival inflammation or scleral icterus is present. Oral exam:  Lips and gums are healthy appearing.There is no oropharyngeal erythema or exudate noted. Dental hygiene is good. Heart:  Normal rate and regular rhythm. S1 and S2 normal without gallop, murmur, click, rub or other extra sounds   Lungs:Chest clear to auscultation; no wheezes, rhonchi,rales ,or rubs present.No increased work of breathing.  Abdomen: bowel sounds hyperactive, soft and non-tender without masses, organomegaly or hernias noted.  No guarding or rebound. Vascular : all pulses equal ; no bruits present. Skin:Warm & dry.  Intact without suspicious lesions or rashes ; no tenting or jaundice  Lymphatic: No lymphadenopathy is noted about the head, neck, axilla Neuro: Strength, tone decreased in all extremities.         Assessment & Plan:  See Current Assessment & Plan in Problem List under specific Diagnosis

## 2015-04-19 NOTE — Telephone Encounter (Signed)
Requesting more physical therapy. 1 more this week, 3x for 2 weeks, and 2x for 3 weeks.

## 2015-04-19 NOTE — Telephone Encounter (Signed)
OK if PT will provide ICD 10 code/Dx

## 2015-04-19 NOTE — Patient Instructions (Addendum)
Use an anti-inflammatory cream such as Aspercreme or Zostrix cream twice a day to the left shoulder as needed. In lieu of this warm moist compresses or  hot water bottle can be used. Do not apply ice  Please take a probiotic , Florastor OR Align, every day if the bowels are loose. This will replace the normal bacteria which  are necessary for formation of normal stool and processing of food.  Your next office appointment will be determined based upon review of your pending labs.Those instructions will be transmitted to you by mail for your records.  Critical results will be called.   Followup as needed for any active or acute issue. Please report any significant change in your symptoms.

## 2015-04-20 ENCOUNTER — Telehealth: Payer: Self-pay | Admitting: Internal Medicine

## 2015-04-20 NOTE — Telephone Encounter (Signed)
Patient has been advised of lab results.  °

## 2015-04-20 NOTE — Assessment & Plan Note (Signed)
LFTs ordered.

## 2015-04-20 NOTE — Assessment & Plan Note (Signed)
Clinically resolved; critical is to pursue ongoing PT/OT treatment of dysphagia

## 2015-04-20 NOTE — Telephone Encounter (Signed)
Patient returned your call.

## 2015-05-02 DIAGNOSIS — J189 Pneumonia, unspecified organism: Secondary | ICD-10-CM | POA: Diagnosis not present

## 2015-05-10 NOTE — Progress Notes (Signed)
   Subjective:    Patient ID: Kristine Simmons, female    DOB: 10-28-1927, 81 y.o.   MRN: [id number]  HPI    Review of Systems     Objective:   Physical Exam        Assessment & Plan:  [birth year] [place]  For 4/26-6/24/16  reviewed & completed.  Handwritten notes made on form  Concerning high risk meds &  the name of the prescribing caregiver beside each medication listed on the patient's Plan of Care medication list.  This is to enhance continuity of care and prevent potential  adverse drug:drug interactions due to duplication of medications as branded and generic forms; repeated medication changes with hospitalizations and/or  at outpatient subspecialty visits; and lack of the EMR in some medical practices. Such risk is greatest with polypharmacy especially among  geriatric patients as has been documented repeatedly by Dr. Elliot Cousin and his associates in multiple long-term studies.

## 2015-05-15 ENCOUNTER — Ambulatory Visit (INDEPENDENT_AMBULATORY_CARE_PROVIDER_SITE_OTHER): Payer: Medicare Other | Admitting: Cardiology

## 2015-05-15 ENCOUNTER — Encounter: Payer: Self-pay | Admitting: Cardiology

## 2015-05-15 ENCOUNTER — Other Ambulatory Visit: Payer: Self-pay | Admitting: Internal Medicine

## 2015-05-15 VITALS — BP 94/62 | HR 56 | Ht 67.5 in | Wt 125.0 lb

## 2015-05-15 DIAGNOSIS — I482 Chronic atrial fibrillation, unspecified: Secondary | ICD-10-CM

## 2015-05-15 NOTE — Patient Instructions (Signed)
Your physician recommends that you schedule a follow-up appointment in: one year with Dr. Percival Spanish

## 2015-05-15 NOTE — Progress Notes (Signed)
HPI The patient presents for followup of atrial fibrillation.   Since I last saw her she has had no new cardiac complaints. She rarely notices palpitations and she's had no presyncope or syncope.  He has had some falls however and her husband short is because she loses her footing. He has seen hernia is not seen her past out. She hurt her nose one time. She's been hospitalized with pneumonia probably aspiration. She's had had 2 inguinal hernia repairs.  She has had no chest pressure, neck or arm discomfort. She's had no weight gain and continues to have some mild ankle edema which is unchanged.   She has had no new SOB/PND.  She wears oxygen at night.  Allergies  Allergen Reactions  . Hydrocodone Nausea And Vomiting  . Desmopressin Acetate     REACTION: swelling feet  . Dorzolamide Hcl Other (See Comments)    Burning in eyes and redness  . Ciprofloxacin     REACTION: vaginal infection    Current Outpatient Prescriptions  Medication Sig Dispense Refill  . acetaminophen (TYLENOL) 325 MG tablet You can take 2 tablets every 4 hours as needed.  You cannot take more than 4000 mg of tylenol per day.  This is also in your sleep aide, and it is in your prescribed pain medicine. So you need to figure out.  Do not take more than 12 tablets of tylenol (acetaminophen) per day.    Marland Kitchen apixaban (ELIQUIS) 2.5 MG TABS tablet Take 1 tablet (2.5 mg total) by mouth 2 (two) times daily. 180 tablet 1  . B Complex Vitamins (VITAMIN B COMPLEX) TABS Take 1 tablet by mouth daily.     . calcium carbonate (TUMS - DOSED IN MG ELEMENTAL CALCIUM) 500 MG chewable tablet Chew 2 tablets by mouth daily as needed for indigestion or heartburn (indigestion).    . Calcium Carbonate-Vitamin D (CALCIUM-VITAMIN D) 500-200 MG-UNIT per tablet Take 1 tablet by mouth daily.     . Cholecalciferol (VITAMIN D) 2000 UNITS tablet Take 2,000 Units by mouth daily.    . Cranberry (CRANBERRY CONCENTRATE) 500 MG CAPS Take 1 capsule by mouth 2  (two) times daily.     . digoxin (LANOXIN) 0.125 MG tablet Take 0.125 mg by mouth daily.    Marland Kitchen diltiazem (CARDIZEM CD) 120 MG 24 hr capsule TAKE (1) CAPSULE DAILY. 90 capsule 0  . diphenhydramine-acetaminophen (TYLENOL PM) 25-500 MG TABS Take 2 tablets by mouth at bedtime as needed (sleep).    . ENSURE PLUS (ENSURE PLUS) LIQD Take 1 Can by mouth daily. 24 Can 11  . estradiol (ESTRACE) 0.1 MG/GM vaginal cream Place 2 g vaginally 2 (two) times a week. On Wednesdays & Saturdays.    Marland Kitchen levothyroxine (SYNTHROID, LEVOTHROID) 50 MCG tablet TAKE 1 TABLET ONCE DAILY. 90 tablet 1  . LORazepam (ATIVAN) 1 MG tablet TAKE 1 TABLET EVERY 8-12 HOURS AS NEEDED. CAN BE HABIT FORMING & MAY AFFECT MENTAL ALERTNESS. 30 tablet 0  . LUMIGAN 0.01 % SOLN Place 1 drop into both eyes at bedtime.    . Magnesium Oxide 500 MG TABS Take 1 tablet by mouth at bedtime.     Marland Kitchen oxyCODONE-acetaminophen (PERCOCET/ROXICET) 5-325 MG per tablet 1/2-1 tab by mouth every 4 hours when necessary for pain (Patient not taking: Reported on 04/19/2015) 16 tablet 0  . Probiotic Product (MISC INTESTINAL FLORA REGULAT) CAPS Take 1 capsule by mouth daily.      . sertraline (ZOLOFT) 25 MG tablet Take 1 tablet (25  mg total) by mouth daily. 30 tablet 5  . timolol (TIMOPTIC) 0.5 % ophthalmic solution Place 1 drop into both eyes 2 (two) times daily.     . traMADol (ULTRAM) 50 MG tablet Take 1 tablet (50 mg total) by mouth every 12 (twelve) hours as needed for moderate pain. (Patient not taking: Reported on 04/19/2015) 30 tablet 0  . trimethoprim (TRIMPEX) 100 MG tablet Take 100 mg by mouth every other day.    Marland Kitchen VYTORIN 10-40 MG per tablet TAKE 1 TABLET ONCE DAILY. 90 tablet 1   No current facility-administered medications for this visit.    Past Medical History  Diagnosis Date  . Hx of colonic polyps     last colonoscopy 2005, repeat was due 2010 Dr. Sharlett Iles  . Atrial fibrillation     Dr Percival Spanish  . Fracture 1995, 2009    RUE; LUE Dr. Durward Fortes  .  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 Cystitis   . Complication of anesthesia     difficulty with arousal post op  . Hyperlipidemia   . Hyperlipidemia   . Osteoporosis   . Cerebrovascular accident 01/09  . Hypothyroidism   . Dysrhythmia     Past Surgical History  Procedure Laterality Date  . Tonsillectomy and adenoidectomy    . Vocal cord polyps    . Thyroid needle biopsy  2002  . Appendectomy    . Cataract extraction      [facility]at  . Colonoscopy w/ polypectomy  2005    Diverticulosis;Dr. Sharlett Iles  . Total hip arthroplasty  2008    CHF & RAF post op   . Total shoulder replacement  2010    Dr Durward Fortes - left shoulder  . Mohs surgery  2013    nasal Basal Cell; Dr Sarajane Jews  . Excision metacarpal mass Right 08/17/2013    Procedure: RIGHT LONG EXCISION MASS AND DIP JOINT DEBRIDEMENT;  Surgeon: Tennis Must, MD;  Location: [facility];  Service: Orthopedics;  Laterality: Right;  . Femoral hernia repair Right 08/17/2014    Procedure: OPEN REPAIR RIGHT FEMORAL HERNIA  WITH INSERTION OF MESH;  Surgeon: Adin Hector, MD;  Location: [facility];  Service: General;  Laterality: Right;  . Insertion of mesh  08/17/2014    Procedure: INSERTION OF MESH;  Surgeon: Adin Hector, MD;  Location: [facility];  Service: General;;  . Hernia repair    . Inguinal hernia repair Right 11/15/2014    Procedure: LAPAROSCOPIC RIGHT INGUINAL HERNIA WITH MESH;  Surgeon: Alphonsa Overall, MD;  Location: [facility];  Service: General;  Laterality: Right;   ROS:  Left eye blindness and decreased gait.  As stated in the HPI and negative for all other systems.  PHYSICAL EXAM BP 94/62 mmHg  Pulse 56  Ht 5' 7.5" (1.715 m)  Wt 125 lb (56.7 kg)  BMI 19.28 kg/m2 GENERAL:  Well appearing HEENT: Anisocoria, fundi not visualized, oral mucosa unremarkable, mild facial droop NECK:  No jugular venous distention, waveform within normal limits, carotid upstroke brisk and symmetric, right bruit vs  transmitted murmur, no thyromegaly LUNGS:  Clear to auscultation bilaterally BACK:  No CVA tenderness CHEST:  Unremarkable HEART:  PMI not displaced or sustained,S1 and S2 within normal limits, no Y8,XK clicks, no rubs, apical systolic early to mid peaking murmur, irregular ABD:  Flat, positive bowel sounds normal in frequency in pitch, no bruits, no rebound, no guarding, no midline pulsatile mass, no hepatomegaly,  no splenomegaly EXT:  2 plus pulses upper and left DP/PT, diminished PT/DP right, mild edema, no cyanosis no clubbing   EKG:  Atrial fibrillation, rate 85, right axis deviation, no acute ST-T wave changes, nonspecific T-wave flattening. 04/08/15  ASSESSMENT AND PLAN  ATRIAL FIBRILLATION:  The patient tolerates atrial fibrillation.  She is tolerating the Eliquis.   CAROTID STENOSIS:  This is followed by Dr. Leonie Man.  MURMUR:  She has had no change in symptoms or exam. No change in therapy is indicated.  She had mild aortic stenosis on her echo previously.  COUGH:  I suspect aspiration.  I have referred her for speech swallow evaluation.

## 2015-05-16 ENCOUNTER — Other Ambulatory Visit: Payer: Self-pay | Admitting: Orthopaedic Surgery

## 2015-05-16 DIAGNOSIS — M545 Low back pain: Secondary | ICD-10-CM

## 2015-05-25 ENCOUNTER — Other Ambulatory Visit: Payer: Self-pay | Admitting: Internal Medicine

## 2015-05-25 ENCOUNTER — Other Ambulatory Visit: Payer: Self-pay | Admitting: Cardiology

## 2015-05-25 ENCOUNTER — Ambulatory Visit: Payer: Medicare Other | Admitting: Internal Medicine

## 2015-05-25 DIAGNOSIS — Z0289 Encounter for other administrative examinations: Secondary | ICD-10-CM

## 2015-05-25 DIAGNOSIS — R4689 Other symptoms and signs involving appearance and behavior: Secondary | ICD-10-CM | POA: Insufficient documentation

## 2015-05-26 ENCOUNTER — Ambulatory Visit (INDEPENDENT_AMBULATORY_CARE_PROVIDER_SITE_OTHER): Payer: Medicare Other | Admitting: Internal Medicine

## 2015-05-26 ENCOUNTER — Other Ambulatory Visit: Payer: Self-pay | Admitting: Internal Medicine

## 2015-05-26 ENCOUNTER — Other Ambulatory Visit (INDEPENDENT_AMBULATORY_CARE_PROVIDER_SITE_OTHER): Payer: Medicare Other

## 2015-05-26 VITALS — BP 110/68 | HR 57 | Temp 97.8°F | Resp 16 | Ht 67.5 in | Wt 121.0 lb

## 2015-05-26 DIAGNOSIS — R110 Nausea: Secondary | ICD-10-CM

## 2015-05-26 DIAGNOSIS — R1030 Lower abdominal pain, unspecified: Secondary | ICD-10-CM

## 2015-05-26 DIAGNOSIS — R630 Anorexia: Secondary | ICD-10-CM

## 2015-05-26 DIAGNOSIS — R109 Unspecified abdominal pain: Secondary | ICD-10-CM

## 2015-05-26 DIAGNOSIS — R1013 Epigastric pain: Secondary | ICD-10-CM

## 2015-05-26 LAB — URINALYSIS, ROUTINE W REFLEX MICROSCOPIC
Bilirubin Urine: NEGATIVE
HGB URINE DIPSTICK: NEGATIVE
Nitrite: NEGATIVE
Specific Gravity, Urine: 1.02 (ref 1.000–1.030)
Total Protein, Urine: NEGATIVE
URINE GLUCOSE: NEGATIVE
Urobilinogen, UA: 0.2 (ref 0.0–1.0)
pH: 6 (ref 5.0–8.0)

## 2015-05-26 LAB — CBC WITH DIFFERENTIAL/PLATELET
BASOS ABS: 0.1 10*3/uL (ref 0.0–0.1)
Basophils Relative: 1.3 % (ref 0.0–3.0)
EOS ABS: 0 10*3/uL (ref 0.0–0.7)
EOS PCT: 0.3 % (ref 0.0–5.0)
HCT: 49 % — ABNORMAL HIGH (ref 36.0–46.0)
Hemoglobin: 16.4 g/dL — ABNORMAL HIGH (ref 12.0–15.0)
Lymphocytes Relative: 24.2 % (ref 12.0–46.0)
Lymphs Abs: 2 10*3/uL (ref 0.7–4.0)
MCHC: 33.4 g/dL (ref 30.0–36.0)
MCV: 88.2 fl (ref 78.0–100.0)
MONO ABS: 0.8 10*3/uL (ref 0.1–1.0)
Monocytes Relative: 9.4 % (ref 3.0–12.0)
NEUTROS ABS: 5.5 10*3/uL (ref 1.4–7.7)
NEUTROS PCT: 64.8 % (ref 43.0–77.0)
Platelets: 257 10*3/uL (ref 150.0–400.0)
RBC: 5.56 Mil/uL — ABNORMAL HIGH (ref 3.87–5.11)
RDW: 14.3 % (ref 11.5–15.5)
WBC: 8.4 10*3/uL (ref 4.0–10.5)

## 2015-05-26 MED ORDER — RANITIDINE HCL 150 MG PO TABS: 150.0000 mg | ORAL_TABLET | Freq: Two times a day (BID) | ORAL | Status: DC

## 2015-05-26 NOTE — Progress Notes (Signed)
Pre visit review using our clinic review tool, if applicable. No additional management support is needed unless otherwise documented below in the visit note. 

## 2015-05-26 NOTE — Patient Instructions (Signed)
Take the ranitidine 30 minutes before breakfast and evening meal. Reflux of gastric acid may be asymptomatic as this may occur mainly during sleep.The triggers for reflux  include stress; the "aspirin family" ; alcohol; peppermint; and caffeine (coffee, tea, cola, and chocolate). The aspirin family would include aspirin and the nonsteroidal agents such as ibuprofen &  Naproxen. Tylenol would not cause reflux. If having symptoms ; food & drink should be avoided for @ least 2 hours before going to bed.

## 2015-05-26 NOTE — Progress Notes (Signed)
   Subjective:    Patient ID: Kristine Simmons, female    DOB: [birth year], 81 y.o.   MRN: [id number]  HPI Since last Sunday she's had nausea, anorexia, lethargy, and abdominal discomfort. She describes both epigastric as well as suprapubic abdominal pain. She can only describe it is "it hurts". This has associated excess gas and flatus. The nausea comes in waves and is unpredictable. She's been followed by physical therapy for dysphagia.    She has some phlegm in her throat.She has a cough but all secretions are clear. She has some postnasal drainage.  She is on a statin. She also is on digitalis. She is not on an H2 blocker or PPI. She does take a noval oral anticoagulation.  She has had hernia surgery twice in the last year for femoral and inguinal hernias..    Review of Systems She denies vomiting.  She has no frontal or maxillary sinus pain. She also denies sore throat.  She does not have associated diarrhea or constipation.  She also denies melena or rectal bleeding.  She is on a prophylactic anabiotic every other day from her Urologist.Dysuria, pyuria, hematuria, frequency, nocturia or polyuria are denied.    Objective:   Physical Exam Pertinent or positive findings include: She has dramatic ptosis on the left. Breath sounds are decreased. She has slight crepitus of the knees. Trace edema is present. Posterior tibial pulses are decreased.  General appearance :adequately nourished; in no distress. Eyes: No conjunctival inflammation or scleral icterus is present. Oral exam:  Lips and gums are healthy appearing.There is no oropharyngeal erythema or exudate noted. Dental hygiene is good. Heart:  Normal rate and regular rhythm. S1 and S2 normal without gallop, murmur, click, rub or other extra sounds   Lungs:Chest clear to auscultation; no wheezes, rhonchi,rales ,or rubs present.No increased work of breathing.  Abdomen: bowel sounds normal, soft and non-tender without masses,  organomegaly or hernias noted.  No guarding or rebound. No flank tenderness to percussion. Vascular : all pulses equal ; no bruits present. Skin:Warm & dry.  Intact without suspicious lesions or rashes ; no tenting or jaundice  Lymphatic: No lymphadenopathy is noted about the head, neck, axilla Neuro: Strength, tone decreased         Assessment & Plan:  #1 nausea  #2 epigastric and lower abdominal pain  #3 Anorexia  Plan: See orders/ recommendations

## 2015-05-27 ENCOUNTER — Encounter: Payer: Self-pay | Admitting: Internal Medicine

## 2015-05-27 LAB — DIGOXIN LEVEL: DIGOXIN LVL: 1.7 ng/mL (ref 0.8–2.0)

## 2015-05-27 LAB — URINE CULTURE
Colony Count: NO GROWTH
Organism ID, Bacteria: NO GROWTH

## 2015-05-29 LAB — HEPATIC FUNCTION PANEL
ALT: 39 U/L — ABNORMAL HIGH (ref 0–35)
AST: 40 U/L — ABNORMAL HIGH (ref 0–37)
Albumin: 4 g/dL (ref 3.5–5.2)
Alkaline Phosphatase: 79 U/L (ref 39–117)
Bilirubin, Direct: 0.1 mg/dL (ref 0.0–0.3)
Total Bilirubin: 0.6 mg/dL (ref 0.2–1.2)
Total Protein: 6.6 g/dL (ref 6.0–8.3)

## 2015-05-29 LAB — LIPASE: Lipase: 32 U/L (ref 11.0–59.0)

## 2015-05-29 LAB — AMYLASE: Amylase: 20 U/L — ABNORMAL LOW (ref 27–131)

## 2015-05-30 ENCOUNTER — Ambulatory Visit
Admission: RE | Admit: 2015-05-30 | Discharge: 2015-05-30 | Disposition: A | Discharge status: Home / Self Care | Payer: PRIVATE HEALTH INSURANCE | Source: Ambulatory Visit | Attending: Orthopaedic Surgery | Admitting: Orthopaedic Surgery

## 2015-05-30 DIAGNOSIS — M545 Low back pain: Secondary | ICD-10-CM

## 2015-06-02 ENCOUNTER — Telehealth: Payer: Self-pay | Admitting: Internal Medicine

## 2015-06-02 NOTE — Telephone Encounter (Signed)
Patient returned your call.

## 2015-06-07 NOTE — Progress Notes (Signed)
labs

## 2015-07-04 ENCOUNTER — Other Ambulatory Visit: Payer: Self-pay

## 2015-07-04 ENCOUNTER — Other Ambulatory Visit: Payer: Self-pay | Admitting: Cardiology

## 2015-07-04 MED ORDER — DILTIAZEM HCL ER COATED BEADS 120 MG PO CP24: ORAL_CAPSULE | ORAL | Status: DC

## 2015-07-24 ENCOUNTER — Telehealth: Payer: Self-pay | Admitting: Pulmonary Disease

## 2015-07-24 DIAGNOSIS — J438 Other emphysema: Secondary | ICD-10-CM

## 2015-07-24 NOTE — Telephone Encounter (Signed)
Spoke with the pt  She is requesting ONO on RA so she can see if she still needs o2 with sleep  Please advise thanks!

## 2015-07-26 NOTE — Telephone Encounter (Signed)
OK BY ME

## 2015-07-26 NOTE — Telephone Encounter (Signed)
Dr. Lake Bells, ok to order ONO on RA?  Patient wants to see if she needs O2 with sleep.  Please advise.

## 2015-07-27 NOTE — Telephone Encounter (Signed)
ONO RA ordered. Pt uses [facility]. Nothing further needed

## 2015-08-05 ENCOUNTER — Other Ambulatory Visit: Payer: Self-pay | Admitting: Cardiology

## 2015-08-07 ENCOUNTER — Telehealth: Payer: Self-pay | Admitting: Pulmonary Disease

## 2015-08-07 DIAGNOSIS — J438 Other emphysema: Secondary | ICD-10-CM

## 2015-08-07 NOTE — Telephone Encounter (Signed)
Called spoke with pt. She is requesting her ONO results that she had done recently and wants to know if she also needs to be seen sooner to go over these results. [name] have you received these yet? thanks

## 2015-08-07 NOTE — Telephone Encounter (Signed)
This has been handed to me after speaking to [facility], and placing this in BQ's look-at.

## 2015-08-08 NOTE — Telephone Encounter (Signed)
-----   Message from Juanito Doom, MD sent at 08/08/2015  8:37 AM EDT ----- A, She needs 2L O2 qHS and repeat ONO. Thanks B

## 2015-08-08 NOTE — Telephone Encounter (Signed)
Spoke with pt to make aware of results and recs.   Order placed.  Nothing further needed at this time.

## 2015-08-22 ENCOUNTER — Telehealth: Payer: Self-pay

## 2015-08-22 DIAGNOSIS — J9621 Acute and chronic respiratory failure with hypoxia: Secondary | ICD-10-CM

## 2015-08-22 IMAGING — CT CT CERVICAL SPINE W/O CM
2 of 5 series · 4 of 14 positions shown, 5 images · non-contrast
Comparison: Headache maxillofacial CT scans 03/12/2011.

CLINICAL DATA: Dizziness and a fall today with a blow to the face
and nose. Nosebleed, facial contusion and abrasion.

EXAM:
CT HEAD WITHOUT CONTRAST
CT MAXILLOFACIAL WITHOUT CONTRAST
CT CERVICAL SPINE WITHOUT CONTRAST
TECHNIQUE: Multidetector CT imaging of the head, cervical spine, and
maxillofacial structures were performed using the standard protocol
without intravenous contrast. Multiplanar CT image reconstructions
of the cervical spine and maxillofacial structures were also
generated.

[Series 9: c-spine st · axial · 0.27mm/px · z∈[+1230,+1286]mm · 2 of 86 slices shown]
[im 29/86  bone]
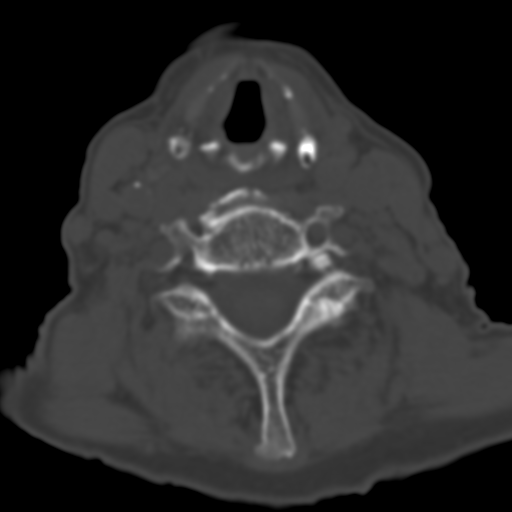
[im 57/86  bone]
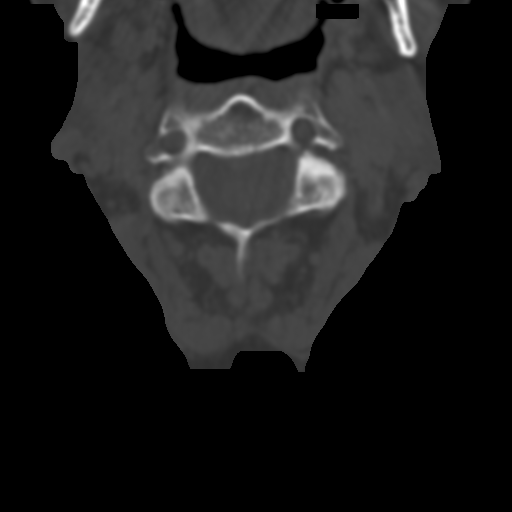

[Series 14: axial · axial · 0.23mm/px · z∈[+1201,+1258]mm · 2 of 99 slices shown, 3 images]
[im 33/99  soft-tissue]
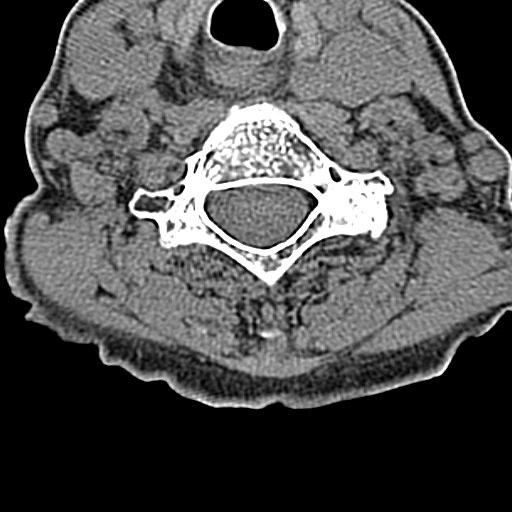
[im 33/99  bone]
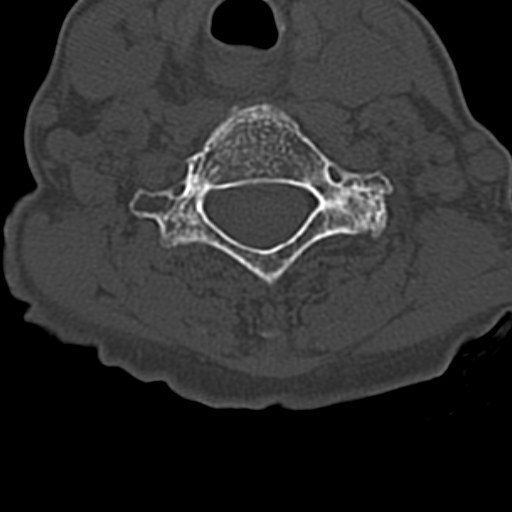
[im 66/99  bone]
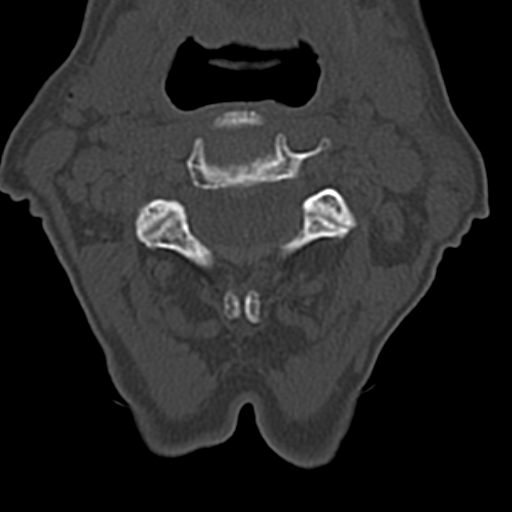

[4 of 14 positions shown; findings below may reference images not displayed]

FINDINGS: CT HEAD FINDINGS

Cortical atrophy and chronic microvascular ischemic change are again
seen. Remote lacunar infarction in the left basal ganglia is
unchanged in appearance. No acute abnormality including hemorrhage,
infarct, mass lesion, mass effect, midline shift or abnormal
extra-axial fluid collection is identified. There is no
hydrocephalus or pneumocephalus. The calvarium is intact.

CT MAXILLOFACIAL FINDINGS

The patient has a acute bilateral nasal bone fractures with minimal
impaction of the superior aspect of the right nasal bone identified.
The facial bones are otherwise intact. The mandibular condyles are
located with degenerative change seen. The patient is status post
bilateral lens extraction. The globes are intact and orbital fat is
clear. Soft tissue contusion above the nose and forehead is noted.

CT CERVICAL SPINE FINDINGS

Vertebral body height is maintained. There is mild reversal of the
normal cervical lordosis. Multilevel degenerative disc disease
appears worst at C5-6 and C6-7. There also has multilevel facet
arthropathy. Paraspinous structures demonstrate carotid
atherosclerosis. Lung apices are emphysematous but clear.
IMPRESSION: No acute abnormality cervical spine.

Bilateral nasal bone fractures with minimal depression of the
superior aspect of the right base above identified. Forehead soft
tissue contusion is also seen.

Atrophy and chronic microvascular ischemic change.

Cervical spondylosis.

Emphysema.

## 2015-08-22 NOTE — Telephone Encounter (Signed)
Spoke with pt to make aware of recs.  Pt states that she does not wish to wear 4lpm qhs, that this is too high.  She wishes to do the repeat ONO on a lower setting than 4lpm.  Pt requests I seek BQ's recommendation for other 02 titration.  BQ please advise.  Thanks!

## 2015-08-22 NOTE — Telephone Encounter (Signed)
-----   Message from Juanito Doom, MD sent at 08/22/2015 10:17 AM EDT ----- A, She needs to increase the O2 to 4LPM qHS and repeat an ONO on that. Thanks B

## 2015-08-23 NOTE — Telephone Encounter (Signed)
3 LPM

## 2015-08-23 NOTE — Telephone Encounter (Signed)
Left message for pt to call back  °

## 2015-08-24 IMAGING — CT CT HEAD W/O CM
2 series · 16 of 30 positions shown, 20 images · non-contrast
Comparison: Brain CT 04/06/2015

CLINICAL DATA: Patient with altered mental status.

EXAM:
CT HEAD WITHOUT CONTRAST
TECHNIQUE: Contiguous axial images were obtained from the base of the skull
through the vertex without intravenous contrast.

[Series 2: head w/o · axial · non-contrast · 0.49mm/px · z∈[-138,-8]mm · 13 of 32 slices shown, 17 images]
[im 3/32  brain]
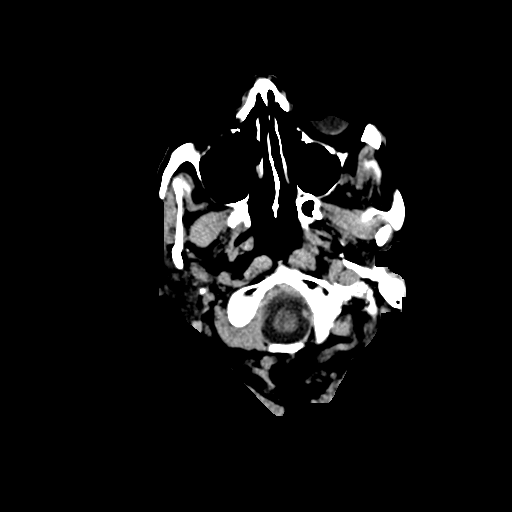
[im 3/32  bone]
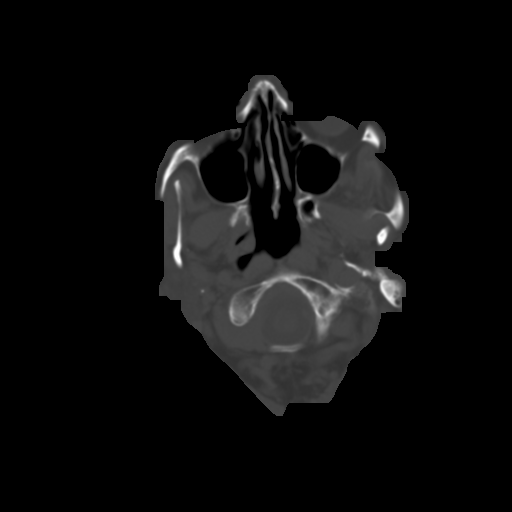
[im 5/32  brain]
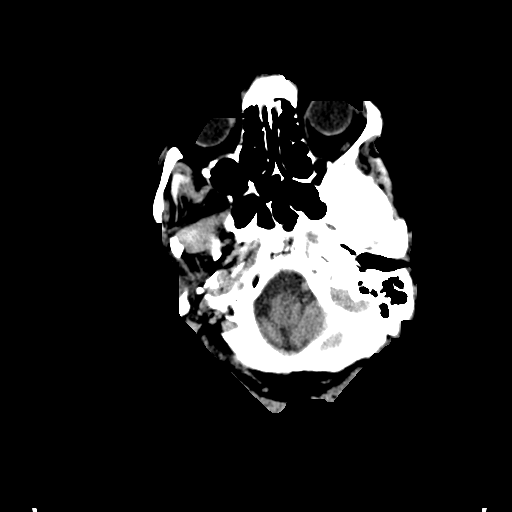
[im 7/32  brain]
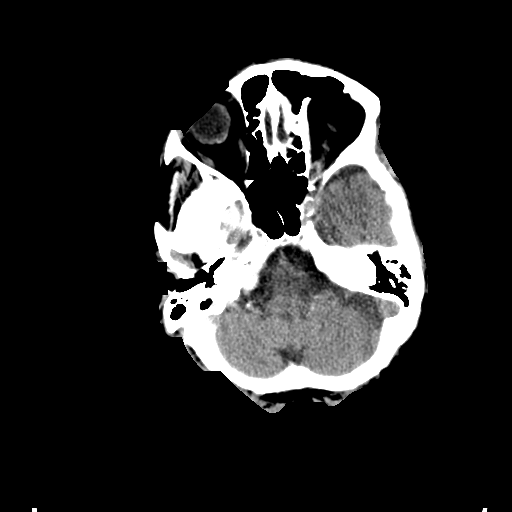
[im 9/32  brain]
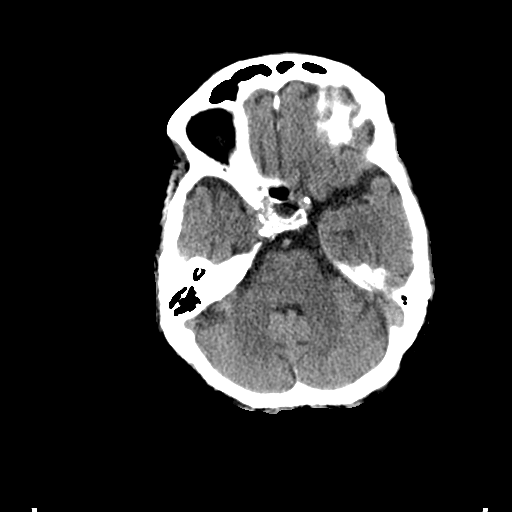
[im 12/32  brain]
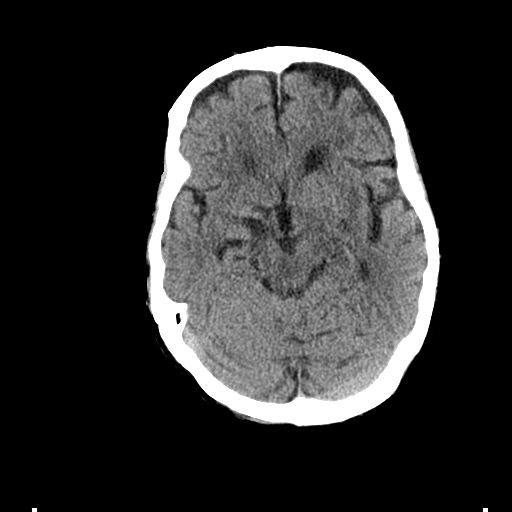
[im 12/32  bone]
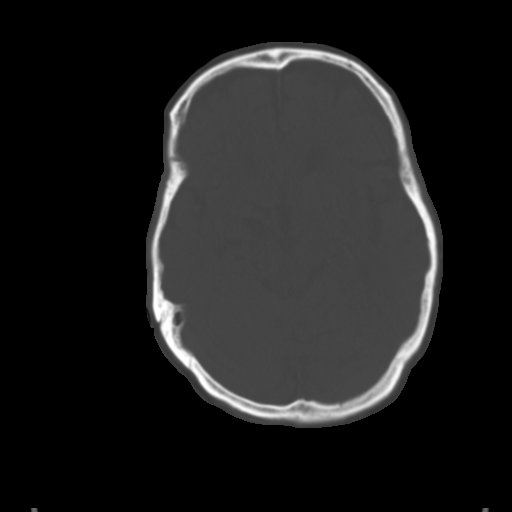
[im 14/32  brain]
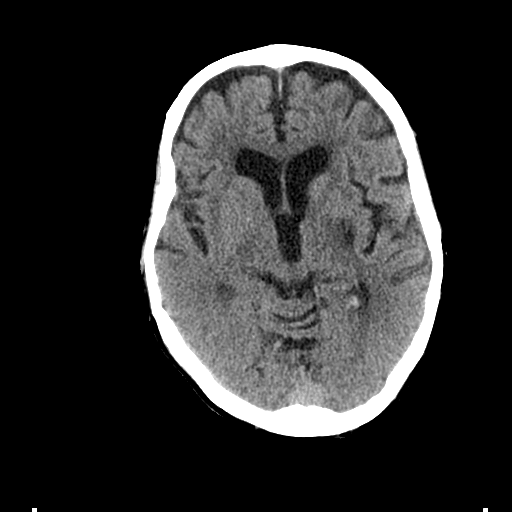
[im 16/32  brain]
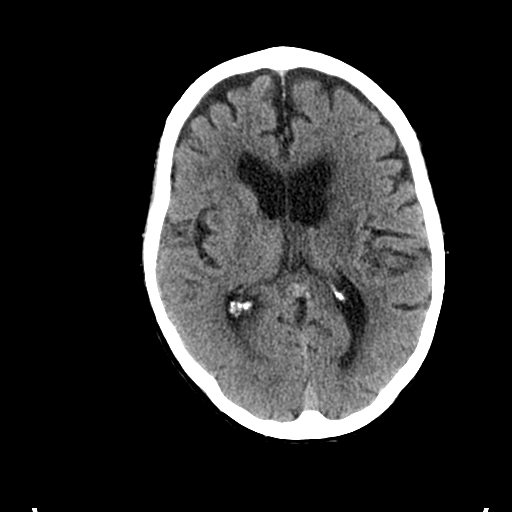
[im 18/32  brain]
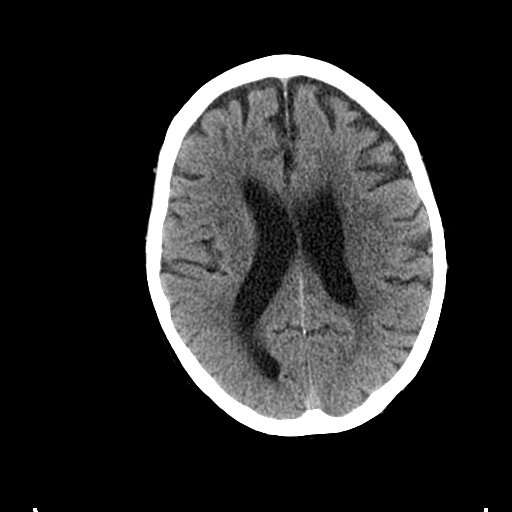
[im 20/32  brain]
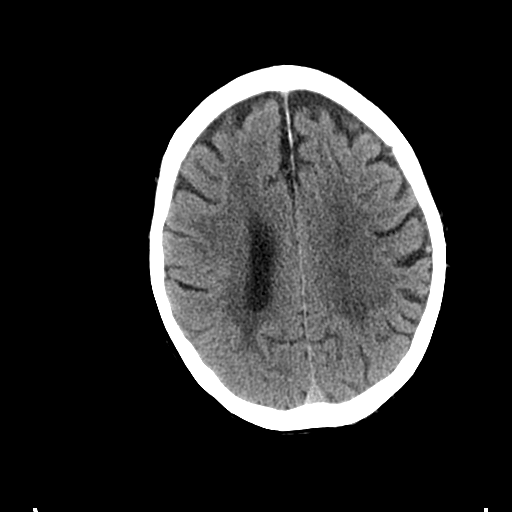
[im 20/32  bone]
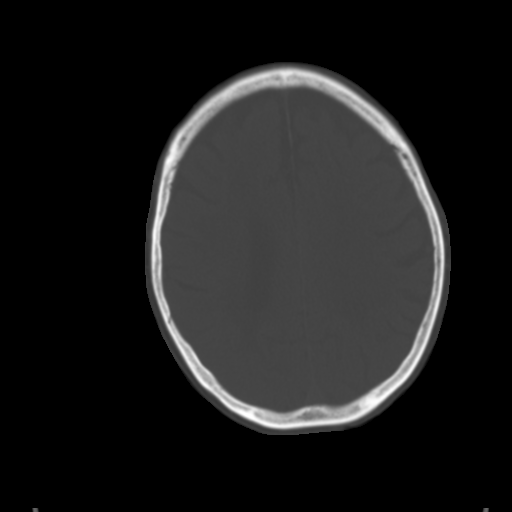
[im 23/32  brain]
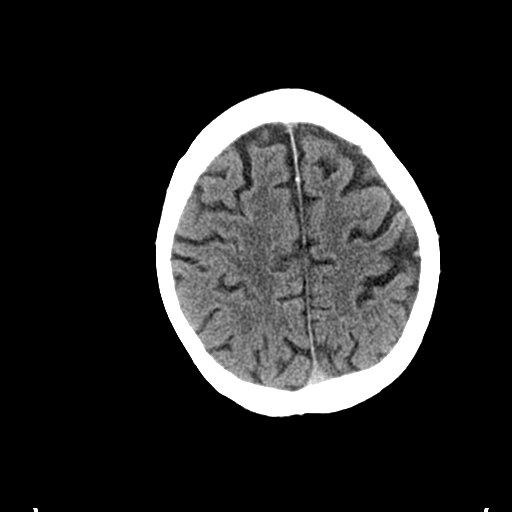
[im 25/32  brain]
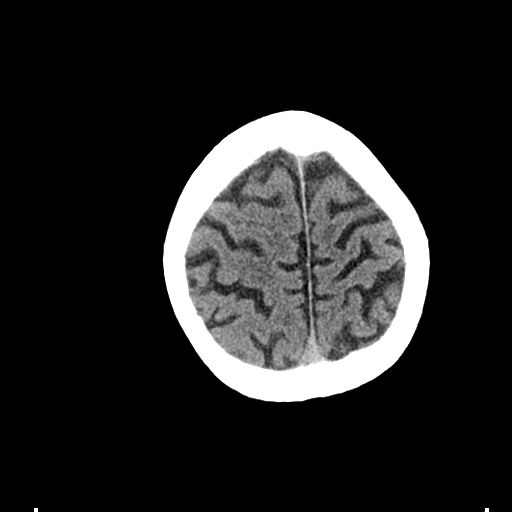
[im 27/32  brain]
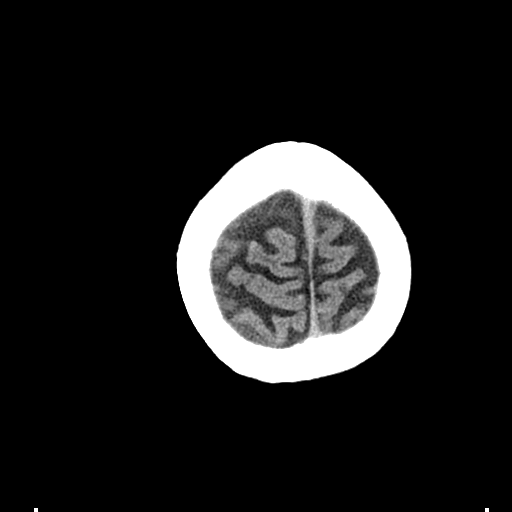
[im 29/32  brain]
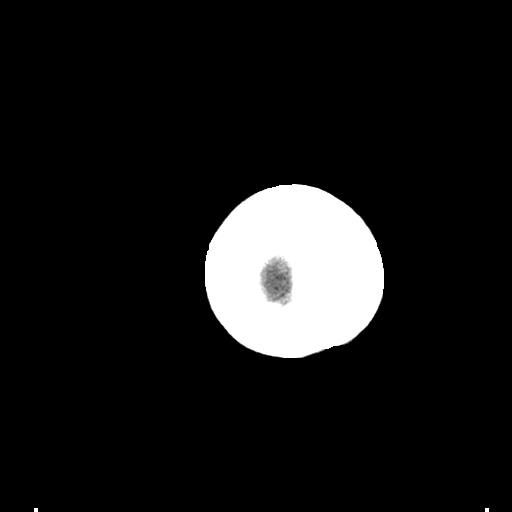
[im 29/32  bone]
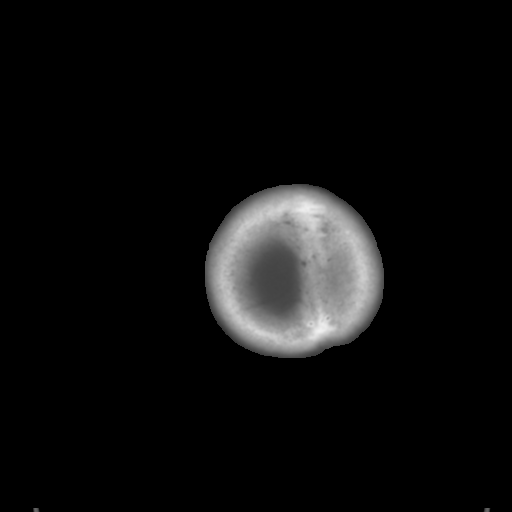

[Series 3: bone windows · axial · 0.49mm/px · z∈[-138,-92]mm · 3 of 32 slices shown]
[im 3/32  bone]
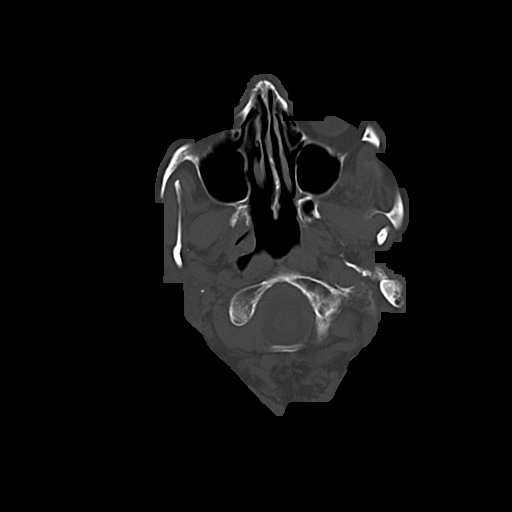
[im 7/32  bone]
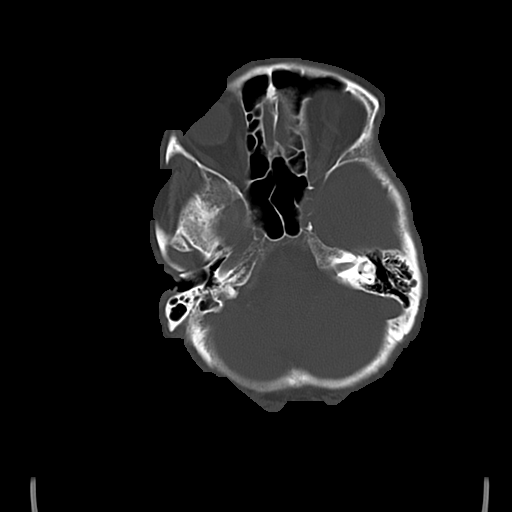
[im 12/32  bone]
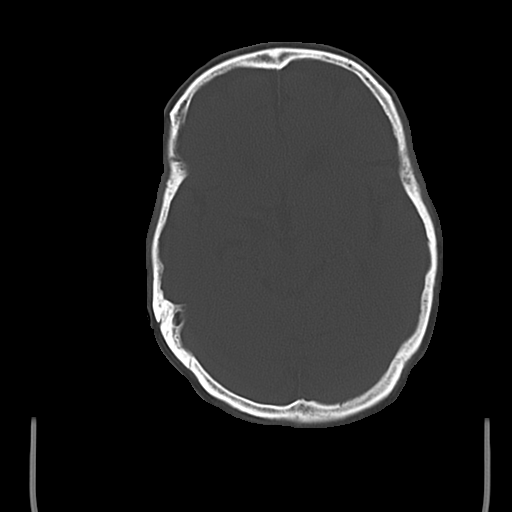

[16 of 30 positions shown; findings below may reference images not displayed]

FINDINGS: Ventricles and sulci are appropriate for patient's age. Re-
demonstrated chronic left basal ganglia infarct. No evidence for
acute cortically based infarct, intracranial hemorrhage, mass lesion
or mass-effect. Periventricular and subcortical white matter
hypodensity compatible with chronic small vessel ischemic changes.
Bilateral cataract surgery. Paranasal sinuses are well aerated.
Mastoid air cells unremarkable. Calvarium is intact.
IMPRESSION: No acute intracranial process.

Chronic small vessel ischemic changes.

Remote lacunar infarct within the left basal ganglia.

## 2015-08-25 NOTE — Telephone Encounter (Signed)
Spoke with pt, ok with doing ONO on 3lpm.   This has been ordered.  Nothing further needed.

## 2015-08-25 NOTE — Telephone Encounter (Signed)
Pt returned call  [phone]

## 2015-09-01 ENCOUNTER — Encounter: Payer: Self-pay | Admitting: Pulmonary Disease

## 2015-09-06 ENCOUNTER — Telehealth: Payer: Self-pay | Admitting: Pulmonary Disease

## 2015-09-06 ENCOUNTER — Encounter: Payer: Self-pay | Admitting: Internal Medicine

## 2015-09-06 ENCOUNTER — Ambulatory Visit (INDEPENDENT_AMBULATORY_CARE_PROVIDER_SITE_OTHER): Payer: Medicare Other | Admitting: Internal Medicine

## 2015-09-06 VITALS — BP 106/62 | HR 68 | Temp 97.7°F | Resp 18 | Wt 126.0 lb

## 2015-09-06 DIAGNOSIS — R110 Nausea: Secondary | ICD-10-CM | POA: Diagnosis not present

## 2015-09-06 DIAGNOSIS — H5712 Ocular pain, left eye: Secondary | ICD-10-CM

## 2015-09-06 DIAGNOSIS — M5416 Radiculopathy, lumbar region: Secondary | ICD-10-CM | POA: Diagnosis not present

## 2015-09-06 MED ORDER — GABAPENTIN 100 MG PO CAPS: ORAL_CAPSULE | ORAL | Status: DC

## 2015-09-06 NOTE — Progress Notes (Signed)
   Subjective:    Patient ID: Kristine Simmons, female    DOB: 24-Dec-1926, 81 y.o.   MRN: [id number]  HPI  She has had right flank pain since 08/30/15  described as sharp and intermittent . The symptoms began after working in the yard and "overdoing it". The pain can be up to level X on a scale of 10. It is worse when she tries to get up or with walking. It radiates into the right groin. She has a past history inguinal hernia repair on right.  She also has a history urinary tract infections and is on antibiotic prophylactically every other day. Her urine is tea colored; but she has no other urinary symptoms.  She's been using mineral ice or Aspercreme or altering heat and ice with some benefit.  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 She has a follow-up ophthalmologic appointment 9/19. She has some itching in the eyes but no purulent discharge.  She denies other extrinsic symptoms or symptoms of upper respiratory tract infection.  Review of Systems Dysuria, pyuria, hematuria, frequency, nocturia or polyuria are denied.  Fever, chills, sweats, or unexplained weight loss not present. There is no numbness, tingling, or weakness in extremities.   No loss of control of bladder or bowels.  She describes the ongoing nausea for which she was seen in June and extensive studies done.  Frontal headache, facial pain , nasal purulence, dental pain, sore throat , otic pain or otic discharge denied.  Melena, rectal bleeding, diarrhea ,or persistently small caliber stools are denied.     Objective:   Physical Exam  Pertinent or positive findings include: She has ptosis bilaterally. There is edema of her  lower lids without conjunctivitis. On the left there is prominent mucosa inside the lower lid. No purulence is noted. She has asymmetry of nasolabial folds. She has a grade 1 systolic murmur. Heart rhythm is slightly irregular. She has minimal rales at the bases. 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 She has one half-1+ bilateral pitting edema. Pedal pulses are decreased except for the left dorsalis pedis pulse. No ischemic changes are noted of the feet. She has minimal crepitus of the knees. She has discomfort in the lumbosacral area of the right when she sits up. Her gait is broad and unsteady.  General appearance :adequately nourished; in no distress.  Eyes: No scleral icterus is present.  Oral exam:  Lips and gums are healthy appearing.There is no oropharyngeal erythema or exudate noted. Dental hygiene is good.  Heart:  No gallop, click, rub or other extra sounds    Lungs:No increased work of breathing.   Abdomen: bowel sounds normal, soft and non-tender without masses, organomegaly or hernias noted.  No guarding or rebound. No flank tenderness to percussion.  Skin:Warm & dry.  Intact without suspicious lesions or rashes ; no tenting or jaundice   Lymphatic: No lymphadenopathy is noted about the head, neck, axilla, or inguinal areas.   Neuro: Strength, tone decreased.     Assessment & Plan:  #1 right L1 radiculopathy secondary to repetitive motion  #2 eye pain, left without evidence of conjunctivitis. She has follow-up with her Ophthalmologist 9/19. She's been asked to call the Ophthalmologist if there is fever or purulent discharge  #3 nausea most likely related to gastritis. Ranitidine &  antireflux measures will be recommended.

## 2015-09-06 NOTE — Patient Instructions (Signed)
Reflux of gastric acid may be asymptomatic as this may occur mainly during sleep.The triggers for reflux  include stress; the "aspirin family" ; alcohol; peppermint; and caffeine (coffee, tea, cola, and chocolate). The aspirin family would include aspirin and the nonsteroidal agents such as ibuprofen &  Naproxen. Tylenol would not cause reflux. If having symptoms ; food & drink should be avoided for @ least 2 hours before going to bed.   Take the acid blocker Zantac 150 mg OTC 30 minutes before breakfast and 30 minutes before the evening meal for 6- 8 weeks for the nausea.  Assess response to the gabapentin one @ bedtime as needed for the back pain.

## 2015-09-06 NOTE — Telephone Encounter (Signed)
Patient did not understand about her appointment rescheduled on 10/17.  Advised patient that we will call her with the results of her ONO once we receive the results, she does not need to come into the office to get those results.  She asked what her appointment on 10/17 is for and I advised her that this appointment is for her 6 month follow up.  Patient verbalized understanding. Nothing further needed.

## 2015-09-06 NOTE — Progress Notes (Signed)
Pre visit review using our clinic review tool, if applicable. No additional management support is needed unless otherwise documented below in the visit note. 

## 2015-09-07 ENCOUNTER — Ambulatory Visit: Payer: PRIVATE HEALTH INSURANCE | Admitting: Pulmonary Disease

## 2015-09-12 ENCOUNTER — Telehealth: Payer: Self-pay

## 2015-09-12 DIAGNOSIS — R0902 Hypoxemia: Secondary | ICD-10-CM

## 2015-09-12 NOTE — Telephone Encounter (Signed)
Spoke with pt, aware of results and reccs.  ONO ordered.  Nothing further needed.

## 2015-09-12 NOTE — Telephone Encounter (Signed)
-----   Message from Juanito Doom, MD sent at 09/11/2015  9:35 PM EDT ----- A, She needs to increase O2 to 4LPM and repeat ONO Thanks B

## 2015-09-18 ENCOUNTER — Telehealth: Payer: Self-pay

## 2015-09-18 NOTE — Telephone Encounter (Signed)
Call to Kristine Simmons and educated regarding AWV; Stated she will be happy to come in this Week as they do not live very far away. Apt with Dr. Linna Darner scheduled on 10/3  Apt for AWV scheduled Thursday 9/29 at 11 and 12:15; stated they were fine with eating late lunch.

## 2015-09-21 ENCOUNTER — Ambulatory Visit (INDEPENDENT_AMBULATORY_CARE_PROVIDER_SITE_OTHER): Payer: Medicare Other

## 2015-09-21 VITALS — BP 122/80 | Ht 68.0 in | Wt 130.5 lb

## 2015-09-21 DIAGNOSIS — Z23 Encounter for immunization: Secondary | ICD-10-CM | POA: Diagnosis not present

## 2015-09-21 DIAGNOSIS — Z0000 Encounter for general adult medical examination without abnormal findings: Secondary | ICD-10-CM | POA: Diagnosis not present

## 2015-09-21 NOTE — Patient Instructions (Addendum)
Ms. Shimer , Thank you for taking time to come for your Medicare Wellness Visit. I appreciate your ongoing commitment to your health goals. Please review the following plan we discussed and let me know if I can assist you in the future.   Manuela Schwartz will refer to the division of the blind for low vision services which may help remaining independent and safe.   Had flu shot this year at [facility];   Will go back to silver sneakers x 2 per week  These are the goals we discussed: Goals    . Exercise 150 minutes per week (moderate activity)     Will go back to silver sneakers when tolerated       This is a list of the screening recommended for you and due dates:  Health Maintenance  Topic Date Due  . DEXA scan (bone density measurement)  01/10/1994  . Pneumonia vaccines (2 of 2 - PCV13) 10/10/2004  . Flu Shot  07/24/2015  . Tetanus Vaccine  10/20/2017  . Shingles Vaccine  Completed   Osteoporosis Throughout your life, your body breaks down old bone and replaces it with new bone. As you get older, your body does not replace bone as quickly as it breaks it down. By the age of 79 years, most people begin to gradually lose bone because of the imbalance between bone loss and replacement. Some people lose more bone than others. Bone loss beyond a specified normal degree is considered osteoporosis.  Osteoporosis affects the strength and durability of your bones. The inside of the ends of your bones and your flat bones, like the bones of your pelvis, look like honeycomb, filled with tiny open spaces. As bone loss occurs, your bones become less dense. This means that the open spaces inside your bones become bigger and the walls between these spaces become thinner. This makes your bones weaker. Bones of a person with osteoporosis can become so weak that they can break (fracture) during minor accidents, such as a simple fall. CAUSES  The following factors have been associated with the development  of osteoporosis:  Smoking.  Drinking more than 2 alcoholic drinks several days per week.  Long-term use of certain medicines:  Corticosteroids.  Chemotherapy medicines.  Thyroid medicines.  Antiepileptic medicines.  Gonadal hormone suppression medicine.  Immunosuppression medicine.  Being underweight.  Lack of physical activity.  Lack of exposure to the sun. This can lead to vitamin D deficiency.  Certain medical conditions:  Certain inflammatory bowel diseases, such as Crohn disease and ulcerative colitis.  Diabetes.  Hyperthyroidism.  Hyperparathyroidism. RISK FACTORS Anyone can develop osteoporosis. However, the following factors can increase your risk of developing osteoporosis:  Gender--Women are at higher risk than men.  Age--Being older than 50 years increases your risk.  Ethnicity--White and Asian people have an increased risk.  Weight --Being extremely underweight can increase your risk of osteoporosis.  Family history of osteoporosis--Having a family member who has developed osteoporosis can increase your risk. SYMPTOMS  Usually, people with osteoporosis have no symptoms.  DIAGNOSIS  Signs during a physical exam that may prompt your caregiver to suspect osteoporosis include:  Decreased height. This is usually caused by the compression of the bones that form your spine (vertebrae) because they have weakened and become fractured.  A curving or rounding of the upper back (kyphosis). To confirm signs of osteoporosis, your caregiver may request a procedure that uses 2 low-dose X-ray beams with different levels of energy to measure your  bone mineral density (dual-energy X-ray absorptiometry [DXA]). Also, your caregiver may check your level of vitamin D. TREATMENT  The goal of osteoporosis treatment is to strengthen bones in order to decrease the risk of bone fractures. There are different types of medicines available to help achieve this goal. Some of  these medicines work by slowing the processes of bone loss. Some medicines work by increasing bone density. Treatment also involves making sure that your levels of calcium and vitamin D are adequate. PREVENTION  There are things you can do to help prevent osteoporosis. Adequate intake of calcium and vitamin D can help you achieve optimal bone mineral density. Regular exercise can also help, especially resistance and weight-bearing activities. If you smoke, quitting smoking is an important part of osteoporosis prevention. MAKE SURE YOU:  Understand these instructions.  Will watch your condition.  Will get help right away if you are not doing well or get worse. FOR MORE INFORMATION www.osteo.org and [email or web address].ee Document Released: 09/18/2005 Document Revised: 04/05/2013 Document Reviewed: 11/23/2011 [facility] Patient Information 2015 [place], [place]. This information is not intended to replace advice given to you by your health care provider. Make sure you discuss any questions you have with your health care provider.  Recommendations for Dexa Scan Female over the age of 37 Man age 98 or older If you broke a bone past the age of 74 Women menopausal age with risk factors (thin frame; smoker; hx of fx ) Post menopausal women under the age of 47 with risk factors A man age 63 to 81 with risk factors Other: Spine xray that is showing break of bone loss Back pain with possible break Height loss of 1/2 inch or more within one year Total loss in height of 1.5 inches from your original height   Fat and Cholesterol Control Diet Fat and cholesterol levels in your blood and organs are influenced by your diet. High levels of fat and cholesterol may lead to diseases of the heart, small and large blood vessels, gallbladder, liver, and pancreas. CONTROLLING FAT AND CHOLESTEROL WITH DIET Although exercise and lifestyle factors are important, your diet is key. That is because certain foods are known to  raise cholesterol and others to lower it. The goal is to balance foods for their effect on cholesterol and more importantly, to replace saturated and trans fat with other types of fat, such as monounsaturated fat, polyunsaturated fat, and omega-3 fatty acids. On average, a person should consume no more than 15 to 17 g of saturated fat daily. Saturated and trans fats are considered "bad" fats, and they will raise LDL cholesterol. Saturated fats are primarily found in animal products such as meats, butter, and cream. However, that does not mean you need to give up all your favorite foods. Today, there are good tasting, low-fat, low-cholesterol substitutes for most of the things you like to eat. Choose low-fat or nonfat alternatives. Choose round or loin cuts of red meat. These types of cuts are lowest in fat and cholesterol. Chicken (without the skin), fish, veal, and ground [place] breast are great choices. Eliminate fatty meats, such as hot dogs and salami. Even shellfish have little or no saturated fat. Have a 3 oz (85 g) portion when you eat lean meat, poultry, or fish. Trans fats are also called "partially hydrogenated oils." They are oils that have been scientifically manipulated so that they are solid at room temperature resulting in a longer shelf life and improved taste and texture of foods in which they  are added. Trans fats are found in stick margarine, some tub margarines, cookies, crackers, and baked goods.  When baking and cooking, oils are a great substitute for butter. The monounsaturated oils are especially beneficial since it is believed they lower LDL and raise HDL. The oils you should avoid entirely are saturated tropical oils, such as coconut and palm.  Remember to eat a lot from food groups that are naturally free of saturated and trans fat, including fish, fruit, vegetables, beans, grains (barley, rice, couscous, bulgur wheat), and pasta (without cream sauces).  IDENTIFYING FOODS THAT LOWER  FAT AND CHOLESTEROL  Soluble fiber may lower your cholesterol. This type of fiber is found in fruits such as apples, vegetables such as broccoli, potatoes, and carrots, legumes such as beans, peas, and lentils, and grains such as barley. Foods fortified with plant sterols (phytosterol) may also lower cholesterol. You should eat at least 2 g per day of these foods for a cholesterol lowering effect.  Read package labels to identify low-saturated fats, trans fat free, and low-fat foods at the supermarket. Select cheeses that have only 2 to 3 g saturated fat per ounce. Use a heart-healthy tub margarine that is free of trans fats or partially hydrogenated oil. When buying baked goods (cookies, crackers), avoid partially hydrogenated oils. Breads and muffins should be made from whole grains (whole-wheat or whole oat flour, instead of "flour" or "enriched flour"). Buy non-creamy canned soups with reduced salt and no added fats.  FOOD PREPARATION TECHNIQUES  Never deep-fry. If you must fry, either stir-fry, which uses very little fat, or use non-stick cooking sprays. When possible, broil, bake, or roast meats, and steam vegetables. Instead of putting butter or margarine on vegetables, use lemon and herbs, applesauce, and cinnamon (for squash and sweet potatoes). Use nonfat yogurt, salsa, and low-fat dressings for salads.  LOW-SATURATED FAT / LOW-FAT FOOD SUBSTITUTES Meats / Saturated Fat (g)  Avoid: Steak, marbled (3 oz/85 g) / 11 g  Choose: Steak, lean (3 oz/85 g) / 4 g  Avoid: Hamburger (3 oz/85 g) / 7 g  Choose: Hamburger, lean (3 oz/85 g) / 5 g  Avoid: Ham (3 oz/85 g) / 6 g  Choose: Ham, lean cut (3 oz/85 g) / 2.4 g  Avoid: Chicken, with skin, dark meat (3 oz/85 g) / 4 g  Choose: Chicken, skin removed, dark meat (3 oz/85 g) / 2 g  Avoid: Chicken, with skin, light meat (3 oz/85 g) / 2.5 g  Choose: Chicken, skin removed, light meat (3 oz/85 g) / 1 g Dairy / Saturated Fat (g)  Avoid: Whole milk  (1 cup) / 5 g  Choose: Low-fat milk, 2% (1 cup) / 3 g  Choose: Low-fat milk, 1% (1 cup) / 1.5 g  Choose: Skim milk (1 cup) / 0.3 g  Avoid: Hard cheese (1 oz/28 g) / 6 g  Choose: Skim milk cheese (1 oz/28 g) / 2 to 3 g  Avoid: Cottage cheese, 4% fat (1 cup) / 6.5 g  Choose: Low-fat cottage cheese, 1% fat (1 cup) / 1.5 g  Avoid: Ice cream (1 cup) / 9 g  Choose: Sherbet (1 cup) / 2.5 g  Choose: Nonfat frozen yogurt (1 cup) / 0.3 g  Choose: Frozen fruit bar / trace  Avoid: Whipped cream (1 tbs) / 3.5 g  Choose: Nondairy whipped topping (1 tbs) / 1 g Condiments / Saturated Fat (g)  Avoid: Mayonnaise (1 tbs) / 2 g  Choose: Low-fat mayonnaise (1 tbs) /  1 g  Avoid: Butter (1 tbs) / 7 g  Choose: Extra light margarine (1 tbs) / 1 g  Avoid: Coconut oil (1 tbs) / 11.8 g  Choose: Olive oil (1 tbs) / 1.8 g  Choose: Corn oil (1 tbs) / 1.7 g  Choose: Safflower oil (1 tbs) / 1.2 g  Choose: Sunflower oil (1 tbs) / 1.4 g  Choose: Soybean oil (1 tbs) / 2.4 g  Choose: Canola oil (1 tbs) / 1 g Document Released: 12/09/2005 Document Revised: 04/05/2013 Document Reviewed: 03/09/2014 [employer] Patient Information 2015 [place], [place]. This information is not intended to replace advice given to you by your health care provider. Make sure you discuss any questions you have with your health care provider.  Fall Prevention and Home Safety Falls cause injuries and can affect all age groups. It is possible to prevent falls.  HOW TO PREVENT FALLS  Wear shoes with rubber soles that do not have an opening for your toes.  Keep the inside and outside of your house well lit.  Use night lights throughout your home.  Remove clutter from floors.  Clean up floor spills.  Remove throw rugs or fasten them to the floor with carpet tape.  Do not place electrical cords across pathways.  Put grab bars by your tub, shower, and toilet. Do not use towel bars as grab bars.  Put handrails on both  sides of the stairway. Fix loose handrails.  Do not climb on stools or stepladders, if possible.  Do not wax your floors.  Repair uneven or unsafe sidewalks, walkways, or stairs.  Keep items you use a lot within reach.  Be aware of pets.  Keep emergency numbers next to the telephone.  Put smoke detectors in your home and near bedrooms. Ask your doctor what other things you can do to prevent falls. Document Released: 10/05/2009 Document Revised: 06/09/2012 Document Reviewed: 03/10/2012 [facility] Patient Information 2015 [place], [place]. This information is not intended to replace advice given to you by your health care provider. Make sure you discuss any questions you have with your health care provider.  Health Maintenance Adopting a healthy lifestyle and getting preventive care can go a long way to promote health and wellness. Talk with your health care provider about what schedule of regular examinations is right for you. This is a good chance for you to check in with your provider about disease prevention and staying healthy. In between checkups, there are plenty of things you can do on your own. Experts have done a lot of research about which lifestyle changes and preventive measures are most likely to keep you healthy. Ask your health care provider for more information. WEIGHT AND DIET  Eat a healthy diet  Be sure to include plenty of vegetables, fruits, low-fat dairy products, and lean protein.  Do not eat a lot of foods high in solid fats, added sugars, or salt.  Get regular exercise. This is one of the most important things you can do for your health.  Most adults should exercise for at least 150 minutes each week. The exercise should increase your heart rate and make you sweat (moderate-intensity exercise).  Most adults should also do strengthening exercises at least twice a week. This is in addition to the moderate-intensity exercise.  Maintain a healthy weight  Body  mass index (BMI) is a measurement that can be used to identify possible weight problems. It estimates body fat based on height and weight. Your health care provider can  help determine your BMI and help you achieve or maintain a healthy weight.  For females 75 years of age and older:   A BMI below 18.5 is considered underweight.  A BMI of 18.5 to 24.9 is normal.  A BMI of 25 to 29.9 is considered overweight.  A BMI of 30 and above is considered obese.  Watch levels of cholesterol and blood lipids  You should start having your blood tested for lipids and cholesterol at 79 years of age, then have this test every 5 years.  You may need to have your cholesterol levels checked more often if:  Your lipid or cholesterol levels are high.  You are older than 79 years of age.  You are at high risk for heart disease.  CANCER SCREENING   Lung Cancer  Lung cancer screening is recommended for adults 103-83 years old who are at high risk for lung cancer because of a history of smoking.  A yearly low-dose CT scan of the lungs is recommended for people who:  Currently smoke.  Have quit within the past 15 years.  Have at least a 30-pack-year history of smoking. A pack year is smoking an average of one pack of cigarettes a day for 1 year.  Yearly screening should continue until it has been 15 years since you quit.  Yearly screening should stop if you develop a health problem that would prevent you from having lung cancer treatment.  Breast Cancer  Practice breast self-awareness. This means understanding how your breasts normally appear and feel.  It also means doing regular breast self-exams. Let your health care provider know about any changes, no matter how small.  If you are in your 20s or 30s, you should have a clinical breast exam (CBE) by a health care provider every 1-3 years as part of a regular health exam.  If you are 81 or older, have a CBE every year. Also consider having a  breast X-ray (mammogram) every year.  If you have a family history of breast cancer, talk to your health care provider about genetic screening.  If you are at high risk for breast cancer, talk to your health care provider about having an MRI and a mammogram every year.  Breast cancer gene (BRCA) assessment is recommended for women who have family members with BRCA-related cancers. BRCA-related cancers include:  Breast.  Ovarian.  Tubal.  Peritoneal cancers.  Results of the assessment will determine the need for genetic counseling and BRCA1 and BRCA2 testing. Cervical Cancer Routine pelvic examinations to screen for cervical cancer are no longer recommended for nonpregnant women who are considered low risk for cancer of the pelvic organs (ovaries, uterus, and vagina) and who do not have symptoms. A pelvic examination may be necessary if you have symptoms including those associated with pelvic infections. Ask your health care provider if a screening pelvic exam is right for you.   The Pap test is the screening test for cervical cancer for women who are considered at risk.  If you had a hysterectomy for a problem that was not cancer or a condition that could lead to cancer, then you no longer need Pap tests.  If you are older than 65 years, and you have had normal Pap tests for the past 10 years, you no longer need to have Pap tests.  If you have had past treatment for cervical cancer or a condition that could lead to cancer, you need Pap tests and screening for cancer for  at least 20 years after your treatment.  If you no longer get a Pap test, assess your risk factors if they change (such as having a new sexual partner). This can affect whether you should start being screened again.  Some women have medical problems that increase their chance of getting cervical cancer. If this is the case for you, your health care provider may recommend more frequent screening and Pap tests.  The  human papillomavirus (HPV) test is another test that may be used for cervical cancer screening. The HPV test looks for the virus that can cause cell changes in the cervix. The cells collected during the Pap test can be tested for HPV.  The HPV test can be used to screen women 16 years of age and older. Getting tested for HPV can extend the interval between normal Pap tests from three to five years.  An HPV test also should be used to screen women of any age who have unclear Pap test results.  After 79 years of age, women should have HPV testing as often as Pap tests.  Colorectal Cancer  This type of cancer can be detected and often prevented.  Routine colorectal cancer screening usually begins at 79 years of age and continues through 79 years of age.  Your health care provider may recommend screening at an earlier age if you have risk factors for colon cancer.  Your health care provider may also recommend using home test kits to check for hidden blood in the stool.  A small camera at the end of a tube can be used to examine your colon directly (sigmoidoscopy or colonoscopy). This is done to check for the earliest forms of colorectal cancer.  Routine screening usually begins at age 54.  Direct examination of the colon should be repeated every 5-10 years through 79 years of age. However, you may need to be screened more often if early forms of precancerous polyps or small growths are found. Skin Cancer  Check your skin from head to toe regularly.  Tell your health care provider about any new moles or changes in moles, especially if there is a change in a mole's shape or color.  Also tell your health care provider if you have a mole that is larger than the size of a pencil eraser.  Always use sunscreen. Apply sunscreen liberally and repeatedly throughout the day.  Protect yourself by wearing long sleeves, pants, a wide-brimmed hat, and sunglasses whenever you are outside. HEART  DISEASE, DIABETES, AND HIGH BLOOD PRESSURE   Have your blood pressure checked at least every 1-2 years. High blood pressure causes heart disease and increases the risk of stroke.  If you are between 28 years and 68 years old, ask your health care provider if you should take aspirin to prevent strokes.  Have regular diabetes screenings. This involves taking a blood sample to check your fasting blood sugar level.  If you are at a normal weight and have a low risk for diabetes, have this test once every three years after 79 years of age.  If you are overweight and have a high risk for diabetes, consider being tested at a younger age or more often. PREVENTING INFECTION  Hepatitis B  If you have a higher risk for hepatitis B, you should be screened for this virus. You are considered at high risk for hepatitis B if:  You were born in a country where hepatitis B is common. Ask your health care provider which countries  are considered high risk.  Your parents were born in a high-risk country, and you have not been immunized against hepatitis B (hepatitis B vaccine).  You have HIV or AIDS.  You use needles to inject street drugs.  You live with someone who has hepatitis B.  You have had sex with someone who has hepatitis B.  You get hemodialysis treatment.  You take certain medicines for conditions, including cancer, organ transplantation, and autoimmune conditions. Hepatitis C  Blood testing is recommended for:  Everyone born from [birth year] through 1963.  Anyone with known risk factors for hepatitis C. Sexually transmitted infections (STIs)  You should be screened for sexually transmitted infections (STIs) including gonorrhea and chlamydia if:  You are sexually active and are younger than 79 years of age.  You are older than 79 years of age and your health care provider tells you that you are at risk for this type of infection.  Your sexual activity has changed since you were last  screened and you are at an increased risk for chlamydia or gonorrhea. Ask your health care provider if you are at risk.  If you do not have HIV, but are at risk, it may be recommended that you take a prescription medicine daily to prevent HIV infection. This is called pre-exposure prophylaxis (PrEP). You are considered at risk if:  You are sexually active and do not regularly use condoms or know the HIV status of your partner(s).  You take drugs by injection.  You are sexually active with a partner who has HIV. Talk with your health care provider about whether you are at high risk of being infected with HIV. If you choose to begin PrEP, you should first be tested for HIV. You should then be tested every 3 months for as long as you are taking PrEP.  PREGNANCY   If you are premenopausal and you may become pregnant, ask your health care provider about preconception counseling.  If you may become pregnant, take 400 to 800 micrograms (mcg) of folic acid every day.  If you want to prevent pregnancy, talk to your health care provider about birth control (contraception). OSTEOPOROSIS AND MENOPAUSE   Osteoporosis is a disease in which the bones lose minerals and strength with aging. This can result in serious bone fractures. Your risk for osteoporosis can be identified using a bone density scan.  If you are 71 years of age or older, or if you are at risk for osteoporosis and fractures, ask your health care provider if you should be screened.  Ask your health care provider whether you should take a calcium or vitamin D supplement to lower your risk for osteoporosis.  Menopause may have certain physical symptoms and risks.  Hormone replacement therapy may reduce some of these symptoms and risks. Talk to your health care provider about whether hormone replacement therapy is right for you.  HOME CARE INSTRUCTIONS   Schedule regular health, dental, and eye exams.  Stay current with your  immunizations.   Do not use any tobacco products including cigarettes, chewing tobacco, or electronic cigarettes.  If you are pregnant, do not drink alcohol.  If you are breastfeeding, limit how much and how often you drink alcohol.  Limit alcohol intake to no more than 1 drink per day for nonpregnant women. One drink equals 12 ounces of beer, 5 ounces of wine, or 1 ounces of hard liquor.  Do not use street drugs.  Do not share needles.  Ask your health  care provider for help if you need support or information about quitting drugs.  Tell your health care provider if you often feel depressed.  Tell your health care provider if you have ever been abused or do not feel safe at home. Document Released: 06/24/2011 Document Revised: 04/25/2014 Document Reviewed: 11/10/2013 [facility] Patient Information 2015 [place], [place]. This information is not intended to replace advice given to you by your health care provider. Make sure you discuss any questions you have with your health care provider.
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
## 2015-09-21 NOTE — Progress Notes (Signed)
Medical screening examination/treatment/procedure(s) were performed by non-physician practitioner and as supervising physician I was immediately available for consultation/collaboration. I agree with above. William Hopper, MD   

## 2015-09-22 ENCOUNTER — Telehealth: Payer: Self-pay

## 2015-09-22 NOTE — Telephone Encounter (Signed)
-----   Message from Juanito Doom, MD sent at 09/21/2015  1:29 PM EDT ----- Initially, Please let her know that her overnight oximetry test was normal Thanks Ruby Cola

## 2015-09-22 NOTE — Telephone Encounter (Signed)
Pt aware of results/ recs.  Advised to continue wearing 4lpm qhs.  Nothing further needed.

## 2015-09-25 ENCOUNTER — Other Ambulatory Visit (INDEPENDENT_AMBULATORY_CARE_PROVIDER_SITE_OTHER): Payer: Medicare Other

## 2015-09-25 ENCOUNTER — Ambulatory Visit (INDEPENDENT_AMBULATORY_CARE_PROVIDER_SITE_OTHER): Payer: Medicare Other | Admitting: Internal Medicine

## 2015-09-25 ENCOUNTER — Encounter: Payer: Self-pay | Admitting: Internal Medicine

## 2015-09-25 VITALS — BP 110/74 | HR 65 | Temp 97.5°F | Resp 18 | Ht 68.0 in | Wt 128.0 lb

## 2015-09-25 DIAGNOSIS — I482 Chronic atrial fibrillation, unspecified: Secondary | ICD-10-CM

## 2015-09-25 DIAGNOSIS — R945 Abnormal results of liver function studies: Secondary | ICD-10-CM

## 2015-09-25 DIAGNOSIS — E785 Hyperlipidemia, unspecified: Secondary | ICD-10-CM

## 2015-09-25 DIAGNOSIS — R1314 Dysphagia, pharyngoesophageal phase: Secondary | ICD-10-CM

## 2015-09-25 DIAGNOSIS — R7989 Other specified abnormal findings of blood chemistry: Secondary | ICD-10-CM | POA: Diagnosis not present

## 2015-09-25 LAB — HEPATIC FUNCTION PANEL
ALT: 19 U/L (ref 0–35)
AST: 29 U/L (ref 0–37)
Albumin: 4.3 g/dL (ref 3.5–5.2)
Alkaline Phosphatase: 52 U/L (ref 39–117)
BILIRUBIN DIRECT: 0.2 mg/dL (ref 0.0–0.3)
BILIRUBIN TOTAL: 1.1 mg/dL (ref 0.2–1.2)
Total Protein: 7.2 g/dL (ref 6.0–8.3)

## 2015-09-25 LAB — BASIC METABOLIC PANEL
BUN: 16 mg/dL (ref 6–23)
CALCIUM: 9.9 mg/dL (ref 8.4–10.5)
CO2: 31 mEq/L (ref 19–32)
CREATININE: 0.94 mg/dL (ref 0.40–1.20)
Chloride: 100 mEq/L (ref 96–112)
GFR: 59.91 mL/min — AB (ref >60.00)
Glucose, Bld: 87 mg/dL (ref 70–99)
Potassium: 5.1 mEq/L (ref 3.5–5.1)
Sodium: 141 mEq/L (ref 135–145)

## 2015-09-25 LAB — LIPID PANEL
CHOL/HDL RATIO: 2
CHOLESTEROL: 137 mg/dL (ref 0–200)
HDL: 69.8 mg/dL (ref >39.00)
LDL CALC: 56 mg/dL (ref 0–99)
NonHDL: 67.56
TRIGLYCERIDES: 59 mg/dL (ref 0.0–149.0)
VLDL: 11.8 mg/dL (ref 0.0–40.0)

## 2015-09-25 LAB — TSH: TSH: 2.07 u[IU]/mL (ref 0.35–4.50)

## 2015-09-25 NOTE — Assessment & Plan Note (Signed)
LFTs 

## 2015-09-25 NOTE — Assessment & Plan Note (Signed)
Lipids, LFTs, TSH  

## 2015-09-25 NOTE — Progress Notes (Signed)
Pre visit review using our clinic review tool, if applicable. No additional management support is needed unless otherwise documented below in the visit note. 

## 2015-09-25 NOTE — Progress Notes (Signed)
   Subjective:    Patient ID: Kristine Simmons, female    DOB: July 30, 1927, 81 y.o.   MRN: [id number]  HPI The patient is here to assess status of active health conditions.  PMH, FH, & Social History reviewed & updated.No change in [place] as recorded.  She describes a modified heart healthy diet. She eats red meat and fried foods occasionally. She does not add salt at the table. She's been unable to exercise due to her back issues. She has been compliant with her medicines without adverse effects. She is on a calcium channel blocker for rate and rhythm control rather than hypertension.  Her nocturnal oxygen was raised to 4 L/m based on overnight study conducted by her Pulmonologist last week.   Review of Systems Her major issue is coughing with meals. There is no frank dysphagia.  She does have some frequency and nocturia at least twice nightly.  She is followed by Dr. Edilia Bo for glaucoma. She's having progressive vision loss on the right.  Chest pain, palpitations, tachycardia, exertional dyspnea, paroxysmal nocturnal dyspnea, claudication or edema are absent. No unexplained weight loss, abdominal pain, significant dyspepsia, dysphagia, melena, rectal bleeding, or persistently small caliber stools. Dysuria, pyuria, hematuria, frequency,or polyuria are denied. Change in hair, skin, nails denied. No bowel changes of constipation or diarrhea. No intolerance to heat or cold.    Objective:   Physical Exam  Pertinent or positive findings include: Ptosis is present greater on the left than the right. She has facial asymmetry with decreased right nasolabial fold. She has decreased hearing bilaterally, greater on the left. She's not wearing her hearing aids. There is decreased range of motion of the cervical spine to the left. 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 She has a grade 1.5 systolic murmur with carotid radiation. Breath sounds are decreased. She has trace-1+ pedal edema. There is decrease  elevation of the left shoulder. She has isolated DIP osteoarthritic changes in the hands. She has crepitus of the knees.  General appearance : Thin but adequately nourished; in no distress.  Eyes: No conjunctival inflammation or scleral icterus is present.  Oral exam:  Lips and gums are healthy appearing.There is no oropharyngeal erythema or exudate noted. Dental hygiene is good.  Heart:  Normal rate and regular rhythm. S1 and S2 normal without gallop, click, rub or other extra sounds    Lungs:Chest clear to auscultation; no wheezes, rhonchi,rales ,or rubs present.No increased work of breathing.   Abdomen: bowel sounds normal, soft and non-tender without masses, organomegaly or hernias noted.  No guarding or rebound.   Vascular : all pulses equal ; no bruits present.  Skin:Warm & dry.  Intact without suspicious lesions or rashes ; no tenting or jaundice   Lymphatic: No lymphadenopathy is noted about the head, neck, axilla.   Neuro: Strength, tone markedly decreased.     Assessment & Plan:  See Current Assessment & Plan in Problem List under specific Diagnosis

## 2015-09-25 NOTE — Assessment & Plan Note (Signed)
Preventive interventions discussed

## 2015-09-25 NOTE — Assessment & Plan Note (Signed)
Rate controlled; no change indicated

## 2015-09-25 NOTE — Patient Instructions (Signed)
  Your next office appointment will be determined based upon review of your pending labs  and  xrays  Those written interpretation of the lab results and instructions will be transmitted to you by mail for your records.  Critical results will be called.   Followup as needed for any active or acute issue. Please report any significant change in your symptoms. 

## 2015-10-03 ENCOUNTER — Encounter: Payer: Self-pay | Admitting: Pulmonary Disease

## 2015-10-09 ENCOUNTER — Ambulatory Visit (INDEPENDENT_AMBULATORY_CARE_PROVIDER_SITE_OTHER): Payer: Medicare Other | Admitting: Pulmonary Disease

## 2015-10-09 ENCOUNTER — Encounter: Payer: Self-pay | Admitting: Pulmonary Disease

## 2015-10-09 VITALS — BP 98/52 | HR 43 | Ht 68.0 in | Wt 130.4 lb

## 2015-10-09 DIAGNOSIS — J438 Other emphysema: Secondary | ICD-10-CM | POA: Diagnosis not present

## 2015-10-09 DIAGNOSIS — J690 Pneumonitis due to inhalation of food and vomit: Secondary | ICD-10-CM | POA: Diagnosis not present

## 2015-10-09 DIAGNOSIS — R1314 Dysphagia, pharyngoesophageal phase: Secondary | ICD-10-CM | POA: Diagnosis not present

## 2015-10-09 DIAGNOSIS — J189 Pneumonia, unspecified organism: Secondary | ICD-10-CM

## 2015-10-09 MED ORDER — BUDESONIDE-FORMOTEROL FUMARATE 80-4.5 MCG/ACT IN AERO: 2.0000 | INHALATION_SPRAY | Freq: Two times a day (BID) | RESPIRATORY_TRACT | Status: DC

## 2015-10-09 NOTE — Progress Notes (Signed)
Subjective:    Patient ID: Kristine Simmons, female    DOB: [birth year], 81 y.o.   MRN: [id number]  Synopsis: GOLD GRADE A COPD January 2016 pulmonary function testing> ratio 58% FEV1 1.83L (88% pred, no change with BD), TLC 5.78L (103% pred), DLCO 9.01 (31% pred) She has overnight hypoxemia and uses 4 L daily at bedtime.  HPI Chief Complaint  Patient presents with  . Follow-up    COPD. Pt c/o wet cough with clear mucus, SOB even with minimal/no exertion and abdominal pain when having SOB. Pt denies CP/tightness. Pt is using O2 qhs and sometimes during a nap in the afternoon. Pt does have albuterol HFA but does not use often.    Kristine Simmons feels like she is getting "addicted" to the oxygen. She feels like her oxygen is not as good as before. She says that every now and then she is not getting a "good breath".  Dyspnea is progressing, occurs at rest.   She worries about her breathing a lot and she worries that her anxiety is contributing. She says that her oxygen at home has been set on 3L [place]. She was hospitalization.   Past Medical History  Diagnosis Date  . Hx of colonic polyps     last colonoscopy 2005, repeat was due 2010 Dr. Sharlett Simmons  . Atrial fibrillation ([place])     Dr Kristine Simmons  . Fracture 1995, 2009    RUE; LUE Dr. Durward Simmons  . 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 Cystitis   . Complication of anesthesia     difficulty with arousal post op  . Hyperlipidemia   . Hyperlipidemia   . Osteoporosis   . Cerebrovascular accident ([place]) 01/09  . Hypothyroidism   . Dysrhythmia       Review of Systems  Constitutional: Negative for fever, chills and fatigue.  HENT: Negative for rhinorrhea, sinus pressure and sneezing.   Respiratory: Positive for cough and shortness of breath. Negative for wheezing.   Cardiovascular: Negative for chest pain, palpitations and leg swelling.       Objective:   Physical Exam Filed Vitals:   10/09/15 1424  BP: 98/52  Pulse:  43  Height: 5\' 8"  (1.727 m)  Weight: 130 lb 6.4 oz (59.149 kg)  SpO2: 83%  RA  Gen: well appearing, no acute distress HEENT: NCAT, EOMi, OP clear,  PULM: CTA B CV: RRR, systolic murmur, JVD noted AB: BS+, soft, nontender,  Ext: warm, no edema, no clubbing, no cyanosis Derm: no rash or skin breakdown Neuro: A&Ox4, MAEW  Dr. Clayborn Simmons notes reviewed April 2016 CT chest images personally reviewed with the family in clinic today.      Assessment & Plan:   COPD, Gold grade A She has only mild airflow obstruction but severe emphysema as seen on my personal review from her April 2016 CT chest. Her hypoxemia has progressed because of the emphysema and her concomitant aspiration pneumonitis leading to scarring in her lungs. Because of worsening shortness of breath I'm going to have her try a sample of a bronchodilator again.  Plan: Trial of Symbicort Left knee no how the Symbicort is working and then I will call in a prescription Flu shot up-to-date  Hypoxemia This problem is worsening. She is currently using 4 L of oxygen at night which is adequate but I believe she needs it during the daytime now.  Plan: Ambulatory oximetry monitoring in the office, will likely prescribed oxygen based on  that result  Dysphagia, pharyngoesophageal phase She has pharyngeal phase dysphagia but her esophagus on her most recent CT chest was patulous. I believe she may have esophageal dysmotility. Today I offered a visit to gastroenterology to discuss this further. She and her family will think about this. If she does decide to go there then I will order a barium swallow first (DG esophagus).  Aspiration pneumonia She had this during her April 2016 hospitalization as seen by bilateral consolidation in the lower lobes with associated lower lobe scarring. We will check a chest x-ray today to ensure improvement of the consolidation but I anticipate that there will be chronic scarring there as seen in previous  chest x-rays.    Updated Medication List Outpatient Encounter Prescriptions as of 10/09/2015  Medication Sig  . acetaminophen (TYLENOL) 325 MG tablet You can take 2 tablets every 4 hours as needed.  You cannot take more than 4000 mg of tylenol per day.  This is also in your sleep aide, and it is in your prescribed pain medicine. So you need to figure out.  Do not take more than 12 tablets of tylenol (acetaminophen) per day.  . albuterol (VENTOLIN HFA) 108 (90 BASE) MCG/ACT inhaler Inhale 2 puffs into the lungs every 6 (six) hours as needed for wheezing or shortness of breath.  . B Complex Vitamins (VITAMIN B COMPLEX) TABS Take 1 tablet by mouth daily.   . calcium carbonate (TUMS - DOSED IN MG ELEMENTAL CALCIUM) 500 MG chewable tablet Chew 2 tablets by mouth daily as needed for indigestion or heartburn (indigestion).  . Calcium Carbonate-Vitamin D (CALCIUM-VITAMIN D) 500-200 MG-UNIT per tablet Take 1 tablet by mouth daily.   . Cholecalciferol (VITAMIN D) 2000 UNITS tablet Take 2,000 Units by mouth daily.  . Cranberry (CRANBERRY CONCENTRATE) 500 MG CAPS Take 1 capsule by mouth 2 (two) times daily.   Marland Kitchen DIGOX 125 MCG tablet TAKE 1 TABLET ONCE DAILY.  Marland Kitchen diltiazem (CARDIZEM CD) 120 MG 24 hr capsule TAKE (1) CAPSULE DAILY.  Marland Kitchen ELIQUIS 2.5 MG TABS tablet TAKE 1 TABLET TWICE DAILY.  Marland Kitchen ENSURE PLUS (ENSURE PLUS) LIQD Take 1 Can by mouth daily.  Marland Kitchen estradiol (ESTRACE) 0.1 MG/GM vaginal cream Place 2 g vaginally 2 (two) times a week. On Wednesdays & Saturdays.  Marland Kitchen gabapentin (NEURONTIN) 100 MG capsule One pill @ bedtime as needed for back pain  . levothyroxine (SYNTHROID, LEVOTHROID) 50 MCG tablet TAKE 1 TABLET ONCE DAILY.  Marland Kitchen LORazepam (ATIVAN) 1 MG tablet TAKE 1 TABLET EVERY 8-12 HOURS AS NEEDED. CAN BE HABIT FORMING & MAY AFFECT MENTAL ALERTNESS.  Marland Kitchen LUMIGAN 0.01 % SOLN Place 1 drop into both eyes at bedtime.  . Magnesium Oxide 500 MG TABS Take 1 tablet by mouth at bedtime.   . Probiotic Product (MISC  INTESTINAL FLORA REGULAT) CAPS Take 1 capsule by mouth daily.    . sertraline (ZOLOFT) 25 MG tablet Take 1 tablet (25 mg total) by mouth daily.  . timolol (TIMOPTIC) 0.5 % ophthalmic solution Place 1 drop into both eyes 2 (two) times daily.   . traMADol (ULTRAM) 50 MG tablet Take 1 tablet (50 mg total) by mouth every 12 (twelve) hours as needed for moderate pain.  Marland Kitchen trimethoprim (TRIMPEX) 100 MG tablet Take 100 mg by mouth every other day.   Marland Kitchen VYTORIN 10-40 MG per tablet TAKE 1 TABLET ONCE DAILY.  . budesonide-formoterol (SYMBICORT) 80-4.5 MCG/ACT inhaler Inhale 2 puffs into the lungs 2 (two) times daily.   No facility-administered  encounter medications on file as of 10/09/2015.

## 2015-10-09 NOTE — Assessment & Plan Note (Signed)
She has pharyngeal phase dysphagia but her esophagus on her most recent CT chest was patulous. I believe she may have esophageal dysmotility. Today I offered a visit to gastroenterology to discuss this further. She and her family will think about this. If she does decide to go there then I will order a barium swallow first (DG esophagus).

## 2015-10-09 NOTE — Assessment & Plan Note (Signed)
This problem is worsening. She is currently using 4 L of oxygen at night which is adequate but I believe she needs it during the daytime now.  Plan: Ambulatory oximetry monitoring in the office, will likely prescribed oxygen based on that result

## 2015-10-09 NOTE — Assessment & Plan Note (Signed)
She had this during her April 2016 hospitalization as seen by bilateral consolidation in the lower lobes with associated lower lobe scarring. We will check a chest x-ray today to ensure improvement of the consolidation but I anticipate that there will be chronic scarring there as seen in previous chest x-rays.

## 2015-10-09 NOTE — Assessment & Plan Note (Signed)
She has only mild airflow obstruction but severe emphysema as seen on my personal review from her April 2016 CT chest. Her hypoxemia has progressed because of the emphysema and her concomitant aspiration pneumonitis leading to scarring in her lungs. Because of worsening shortness of breath I'm going to have her try a sample of a bronchodilator again.  Plan: Trial of Symbicort Left knee no how the Symbicort is working and then I will call in a prescription Flu shot up-to-date

## 2015-10-09 NOTE — Addendum Note (Signed)
Addended by: Lorane Gell on: 10/09/2015 03:44 PM   Modules accepted: Orders

## 2015-10-09 NOTE — Patient Instructions (Signed)
We will call you with the results of the chest x-ray Try Symbicort 2 puffs twice a day no matter how you feel call me and let me know if you think it is helping Use oxygen as we have prescribed Let us know if you want to see the gastrointestinal specialist, if you do we will need to order a barium swallow test We will see you back in 4 months or sooner if needed

## 2015-10-10 ENCOUNTER — Telehealth: Payer: Self-pay | Admitting: Pulmonary Disease

## 2015-10-10 ENCOUNTER — Ambulatory Visit (INDEPENDENT_AMBULATORY_CARE_PROVIDER_SITE_OTHER)
Admission: RE | Admit: 2015-10-10 | Discharge: 2015-10-10 | Disposition: A | Discharge status: Home / Self Care | Payer: Medicare Other | Source: Ambulatory Visit | Attending: Pulmonary Disease | Admitting: Pulmonary Disease

## 2015-10-10 DIAGNOSIS — J189 Pneumonia, unspecified organism: Secondary | ICD-10-CM

## 2015-10-10 DIAGNOSIS — J438 Other emphysema: Secondary | ICD-10-CM

## 2015-10-10 NOTE — Telephone Encounter (Signed)
Spoke with Melissa from [place] was questioning order due to the fact that [facility] does not eval for cont O2. Melissa stated that pt qualifies for cont O2 based on finding during OV and she was wondering if order should have been written as d/c nocturnal O2 and begin O2 cont at all times and eval for POC If so, order will need to be redone per [facility]  Dr Lake Bells, please advise. Thanks

## 2015-10-10 NOTE — Telephone Encounter (Signed)
Spoke with pt.  Pt was seen in office yesterday and was ordered to get a cxr and she forgot to get it.  PT will go down now to get cxr and we will call with results.  Pt also reports that she started the Symbicort sample this morning.  Pt reports that her sob has been worse today and not sure if this was caused by Symbicort or not.  Pt seemed very anxious.  Encouraged pt to try to relax with deep breathes.  Pt also states that she is willing to go see GI specialist if Dr Lake Bells wants to refer her.  Advised pt to get cxr and we will call with results and further recommendations.

## 2015-10-10 NOTE — Telephone Encounter (Signed)
Discussed with RB: cxr looks better, continue same recommendations.  Pt notified by Charma Igo CMA Will route back to BQ as FYI and for further recs

## 2015-10-10 NOTE — Telephone Encounter (Signed)
Pt was walked in the office and had qualifying sats (in epic in [facility] note) Called Melissa and LMTCB x1

## 2015-10-11 NOTE — Telephone Encounter (Signed)
Spoke with Melissa at [facility], states that BQ has not yet cosigned the order for pt's POC eval.  States that PCC's can print and sign order if providers aren't here in office to [place] personally.  Order needs to also be  Re-ordered with more clear wording per [facility].  Order placed.   Referral re-ordered at [facility] request.  Forwarding to [facility] to ensure that this order gets printed and faxed as BQ is not in the office until tomorrow afternoon. Thanks.

## 2015-10-11 NOTE — Telephone Encounter (Signed)
Order was printed and faxed on 10/10/15 to [phone] Joellen [place]

## 2015-10-11 NOTE — Telephone Encounter (Signed)
Kristine Simmons [phone] calling back

## 2015-10-12 ENCOUNTER — Telehealth: Payer: Self-pay | Admitting: Pulmonary Disease

## 2015-10-12 NOTE — Telephone Encounter (Signed)
Called and spoke to pt. Pt stated she has not heard from [facility] regarding her O2. Called [facility] and they have not received the order, order and last OV faxed to [facility] at [phone]. Called and informed pt that order was faxed. Pt verbalized understanding and denied any further questions or concerns at this time.

## 2015-10-24 ENCOUNTER — Encounter (HOSPITAL_COMMUNITY): Payer: Self-pay | Admitting: Emergency Medicine

## 2015-10-24 ENCOUNTER — Emergency Department (HOSPITAL_COMMUNITY)
Admission: EM | Admit: 2015-10-24 | Discharge: 2015-10-24 | Disposition: A | Discharge status: Home / Self Care | Payer: Medicare Other | Attending: Emergency Medicine | Admitting: Emergency Medicine

## 2015-10-24 ENCOUNTER — Emergency Department (HOSPITAL_COMMUNITY): Payer: Medicare Other

## 2015-10-24 DIAGNOSIS — Z7902 Long term (current) use of antithrombotics/antiplatelets: Secondary | ICD-10-CM | POA: Insufficient documentation

## 2015-10-24 DIAGNOSIS — H409 Unspecified glaucoma: Secondary | ICD-10-CM | POA: Diagnosis not present

## 2015-10-24 DIAGNOSIS — Z87828 Personal history of other (healed) physical injury and trauma: Secondary | ICD-10-CM | POA: Insufficient documentation

## 2015-10-24 DIAGNOSIS — E039 Hypothyroidism, unspecified: Secondary | ICD-10-CM | POA: Diagnosis not present

## 2015-10-24 DIAGNOSIS — S3992XA Unspecified injury of lower back, initial encounter: Secondary | ICD-10-CM | POA: Insufficient documentation

## 2015-10-24 DIAGNOSIS — Z87891 Personal history of nicotine dependence: Secondary | ICD-10-CM | POA: Insufficient documentation

## 2015-10-24 DIAGNOSIS — I4891 Unspecified atrial fibrillation: Secondary | ICD-10-CM | POA: Insufficient documentation

## 2015-10-24 DIAGNOSIS — Y9389 Activity, other specified: Secondary | ICD-10-CM | POA: Diagnosis not present

## 2015-10-24 DIAGNOSIS — H540 Blindness, both eyes: Secondary | ICD-10-CM | POA: Insufficient documentation

## 2015-10-24 DIAGNOSIS — M545 Low back pain, unspecified: Secondary | ICD-10-CM

## 2015-10-24 DIAGNOSIS — Z8673 Personal history of transient ischemic attack (TIA), and cerebral infarction without residual deficits: Secondary | ICD-10-CM | POA: Insufficient documentation

## 2015-10-24 DIAGNOSIS — M810 Age-related osteoporosis without current pathological fracture: Secondary | ICD-10-CM | POA: Insufficient documentation

## 2015-10-24 DIAGNOSIS — Z86010 Personal history of colonic polyps: Secondary | ICD-10-CM | POA: Diagnosis not present

## 2015-10-24 DIAGNOSIS — Z79899 Other long term (current) drug therapy: Secondary | ICD-10-CM | POA: Insufficient documentation

## 2015-10-24 DIAGNOSIS — E785 Hyperlipidemia, unspecified: Secondary | ICD-10-CM | POA: Diagnosis not present

## 2015-10-24 DIAGNOSIS — Y998 Other external cause status: Secondary | ICD-10-CM | POA: Insufficient documentation

## 2015-10-24 DIAGNOSIS — W1839XA Other fall on same level, initial encounter: Secondary | ICD-10-CM | POA: Diagnosis not present

## 2015-10-24 DIAGNOSIS — Z87440 Personal history of urinary (tract) infections: Secondary | ICD-10-CM | POA: Insufficient documentation

## 2015-10-24 DIAGNOSIS — W19XXXA Unspecified fall, initial encounter: Secondary | ICD-10-CM

## 2015-10-24 DIAGNOSIS — Y9289 Other specified places as the place of occurrence of the external cause: Secondary | ICD-10-CM | POA: Insufficient documentation

## 2015-10-24 LAB — CBC WITH DIFFERENTIAL/PLATELET
BASOS PCT: 1 %
Basophils Absolute: 0.1 10*3/uL (ref 0.0–0.1)
EOS ABS: 0.2 10*3/uL (ref 0.0–0.7)
EOS PCT: 3 %
HCT: 47.4 % — ABNORMAL HIGH (ref 36.0–46.0)
HEMOGLOBIN: 15.7 g/dL — AB (ref 12.0–15.0)
Lymphocytes Relative: 15 %
Lymphs Abs: 0.9 10*3/uL (ref 0.7–4.0)
MCH: 29.3 pg (ref 26.0–34.0)
MCHC: 33.1 g/dL (ref 30.0–36.0)
MCV: 88.4 fL (ref 78.0–100.0)
MONO ABS: 0.5 10*3/uL (ref 0.1–1.0)
Monocytes Relative: 9 %
NEUTROS ABS: 4.3 10*3/uL (ref 1.7–7.7)
Neutrophils Relative %: 72 %
Platelets: 196 10*3/uL (ref 150–400)
RBC: 5.36 MIL/uL — AB (ref 3.87–5.11)
RDW: 13.8 % (ref 11.5–15.5)
WBC: 5.9 10*3/uL (ref 4.0–10.5)

## 2015-10-24 LAB — DIFFERENTIAL
Basophils Absolute: 0.1 10*3/uL (ref 0.0–0.1)
Basophils Relative: 1 %
EOS PCT: 1 %
Eosinophils Absolute: 0.1 10*3/uL (ref 0.0–0.7)
LYMPHS ABS: 1 10*3/uL (ref 0.7–4.0)
LYMPHS PCT: 13 %
MONO ABS: 0.6 10*3/uL (ref 0.1–1.0)
MONOS PCT: 7 %
NEUTROS ABS: 6.2 10*3/uL (ref 1.7–7.7)
Neutrophils Relative %: 78 %

## 2015-10-24 LAB — CBC
HCT: 45.3 % (ref 36.0–46.0)
Hemoglobin: 14.9 g/dL (ref 12.0–15.0)
MCH: 29.2 pg (ref 26.0–34.0)
MCHC: 32.9 g/dL (ref 30.0–36.0)
MCV: 88.6 fL (ref 78.0–100.0)
Platelets: 179 10*3/uL (ref 150–400)
RBC: 5.11 MIL/uL (ref 3.87–5.11)
RDW: 13.8 % (ref 11.5–15.5)
WBC: 8 10*3/uL (ref 4.0–10.5)

## 2015-10-24 LAB — BASIC METABOLIC PANEL
Anion gap: 5 (ref 5–15)
BUN: 13 mg/dL (ref 6–20)
CHLORIDE: 103 mmol/L (ref 101–111)
CO2: 31 mmol/L (ref 22–32)
CREATININE: 0.92 mg/dL (ref 0.44–1.00)
Calcium: 9.1 mg/dL (ref 8.9–10.3)
GFR calc non Af Amer: 55 mL/min — ABNORMAL LOW (ref >60)
Glucose, Bld: 115 mg/dL — ABNORMAL HIGH (ref 65–99)
Potassium: 4.5 mmol/L (ref 3.5–5.1)
SODIUM: 139 mmol/L (ref 135–145)

## 2015-10-24 LAB — I-STAT TROPONIN, ED: TROPONIN I, POC: 0.01 ng/mL (ref 0.00–0.08)

## 2015-10-24 MED ORDER — TRAMADOL HCL 50 MG PO TABS: 50.0000 mg | ORAL_TABLET | Freq: Two times a day (BID) | ORAL | Status: DC | PRN

## 2015-10-24 MED ORDER — ONDANSETRON HCL 4 MG/2ML IJ SOLN
4.0000 mg | Freq: Once | INTRAMUSCULAR | Status: AC | PRN
  Administered 2015-10-24: 4 mg via INTRAVENOUS
  Filled 2015-10-24: qty 2

## 2015-10-24 MED ORDER — HYDROMORPHONE HCL 1 MG/ML IJ SOLN
0.5000 mg | INTRAMUSCULAR | Status: DC | PRN
  Administered 2015-10-24: 0.5 mg via INTRAVENOUS
  Filled 2015-10-24: qty 1

## 2015-10-24 NOTE — ED Provider Notes (Signed)
CSN: [id number]     Arrival date & time 10/24/15  3710 History   First MD Initiated Contact with Patient 10/24/15 [phone]     Chief Complaint  Patient presents with  . Fall   HPI Patient presents to the emergency room with complaints of back pain. The patient was standing up trying to put on her shoe when she lost her balance and fell. Patient landed on her back. She felt a crack and experienced pain in her lower back. Patient states the pain is severe and she was not able to stand. She had to call EMS. Eyes any numbness or weakness. She denies any abdominal pain or hip pain. She did not hit her head. She denies loss of consciousness or neck pain. Patient was not having any other medical issues acutely prior to the fall. Past Medical History  Diagnosis Date  . Hx of colonic polyps     last colonoscopy 2005, repeat was due 2010 Dr. Sharlett Iles  . Atrial fibrillation ([place])     Dr Percival Spanish  . Fracture 1995, 2009    RUE; LUE Dr. Durward Fortes  . 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 Cystitis   . Complication of anesthesia     difficulty with arousal post op  . Hyperlipidemia   . Hyperlipidemia   . Osteoporosis   . Cerebrovascular accident ([place]) 01/09  . Hypothyroidism   . Dysrhythmia    Past Surgical History  Procedure Laterality Date  . Tonsillectomy and adenoidectomy    . Vocal cord polyps    . Thyroid needle biopsy  2002  . Appendectomy    . Cataract extraction      [place]ilat  . Colonoscopy w/ polypectomy  2005    Diverticulosis;Dr. Sharlett Iles  . Total hip arthroplasty  2008    CHF & RAF post op   . Total shoulder replacement  2010    Dr Durward Fortes - left shoulder  . Mohs surgery  2013    nasal Basal Cell; Dr Sarajane Jews  . Excision metacarpal mass Right 08/17/2013    Procedure: RIGHT LONG EXCISION MASS AND DIP JOINT DEBRIDEMENT;  Surgeon: Tennis Must, MD;  Location: [place];  Service: Orthopedics;  Laterality: Right;  . Femoral hernia repair Right  08/17/2014    Procedure: OPEN REPAIR RIGHT FEMORAL HERNIA  WITH INSERTION OF MESH;  Surgeon: Adin Hector, MD;  Location: [facility];  Service: General;  Laterality: Right;  . Insertion of mesh  08/17/2014    Procedure: INSERTION OF MESH;  Surgeon: Adin Hector, MD;  Location: [facility];  Service: General;;  . Inguinal hernia repair Right 11/15/2014    Procedure: LAPAROSCOPIC RIGHT INGUINAL HERNIA WITH MESH;  Surgeon: Alphonsa Overall, MD;  Location: [facility];  Service: General;  Laterality: Right;   Family History  Problem Relation Age of Onset  . Stroke Father     > 62  . Diabetes Father   . Leukemia Mother   . Cancer Mother     leukemia  . Colon cancer Brother     Valve replacement  . Heart attack Brother   . Breast cancer Sister   . Ovarian cancer Sister   . Lung cancer Sister     smoker  . Prostate cancer Brother   . Cancer Brother     bladder   Social History  Substance Use Topics  . Smoking status: Former Smoker -- 1.00 packs/day for 45 years  Types: Cigarettes    Quit date: 12/23/1993  . Smokeless tobacco: Never Used     Comment: Smoked 1950-1995, up to 1 ppd  . Alcohol Use: No   OB History    No data available     Review of Systems  All other systems reviewed and are negative.     Allergies  Hydrocodone; Desmopressin acetate; Dorzolamide hcl; Prempro; and Ciprofloxacin  Home Medications   Prior to Admission medications   Medication Sig Start Date End Date Taking? Authorizing Provider  acetaminophen (TYLENOL) 500 MG tablet Take 1,000 mg by mouth every 6 (six) hours as needed for moderate pain.   Yes Historical Provider, MD  albuterol (VENTOLIN HFA) 108 (90 BASE) MCG/ACT inhaler Inhale 2 puffs into the lungs every 6 (six) hours as needed for wheezing or shortness of breath.   Yes Historical Provider, MD  apraclonidine (IOPIDINE) 0.5 % ophthalmic solution Place 1 drop into the right eye every 12 (twelve) hours.   Yes Historical Provider, MD  calcium carbonate  (TUMS - DOSED IN MG ELEMENTAL CALCIUM) 500 MG chewable tablet Chew 2 tablets by mouth daily as needed for indigestion or heartburn (indigestion).   Yes Historical Provider, MD  Cholecalciferol (VITAMIN D) 2000 UNITS tablet Take 2,000 Units by mouth daily.   Yes Historical Provider, MD  Cranberry (CRANBERRY CONCENTRATE) 500 MG CAPS Take 1 capsule by mouth 2 (two) times daily.    Yes Historical Provider, MD  DIGOX 125 MCG tablet TAKE 1 TABLET ONCE DAILY. 05/26/15  Yes Minus Breeding, MD  diltiazem (CARDIZEM CD) 120 MG 24 hr capsule TAKE (1) CAPSULE DAILY. Patient taking differently: Take 120 mg by mouth daily.  07/04/15  Yes Minus Breeding, MD  ELIQUIS 2.5 MG TABS tablet TAKE 1 TABLET TWICE DAILY. 08/07/15  Yes Minus Breeding, MD  ENSURE PLUS (ENSURE PLUS) LIQD Take 1 Can by mouth daily. 12/20/14  Yes Hendricks Limes, MD  estradiol (ESTRACE) 0.1 MG/GM vaginal cream Place 2 g vaginally 2 (two) times a week. On Wednesdays & Saturdays.   Yes Historical Provider, MD  levothyroxine (SYNTHROID, LEVOTHROID) 50 MCG tablet TAKE 1 TABLET ONCE DAILY. Patient taking differently: Take 50 mcg daily except take 75 mcg on Wednesday 05/25/15  Yes Hendricks Limes, MD  LORazepam (ATIVAN) 1 MG tablet TAKE 1 TABLET EVERY 8-12 HOURS AS NEEDED. CAN BE HABIT FORMING & MAY AFFECT MENTAL ALERTNESS. Patient taking differently: TAKE 1/2 TABLET EVERY 8-12 HOURS AS NEEDED Anxiety and sleep 11/23/14  Yes Biagio Borg, MD  LUMIGAN 0.01 % SOLN Place 1 drop into both eyes at bedtime. 04/06/15  Yes Historical Provider, MD  Magnesium Oxide 500 MG TABS Take 1 tablet by mouth at bedtime.    Yes Historical Provider, MD  OXYGEN Inhale into the lungs continuous.   Yes Historical Provider, MD  Probiotic Product (MISC INTESTINAL FLORA REGULAT) CAPS Take 1 capsule by mouth daily.     Yes Historical Provider, MD  Propylene Glycol (SYSTANE BALANCE OP) Place 1 drop into both eyes as needed (dry and pain).   Yes Historical Provider, MD  sodium chloride  (MURO 128) 5 % ophthalmic solution Place 1 drop into the left eye 3 (three) times daily.   Yes Historical Provider, MD  timolol (TIMOPTIC) 0.5 % ophthalmic solution Place 1 drop into both eyes 2 (two) times daily.    Yes Historical Provider, MD  trimethoprim (TRIMPEX) 100 MG tablet Take 100 mg by mouth every other day.    Yes Historical Provider, MD  VYTORIN 10-40 MG per tablet TAKE 1 TABLET ONCE DAILY. 05/15/15  Yes Hendricks Limes, MD  acetaminophen (TYLENOL) 325 MG tablet You can take 2 tablets every 4 hours as needed.  You cannot take more than 4000 mg of tylenol per day.  This is also in your sleep aide, and it is in your prescribed pain medicine. So you need to figure out.  Do not take more than 12 tablets of tylenol (acetaminophen) per day. Patient not taking: Reported on 10/24/2015 08/19/14   Earnstine Regal, PA-C  budesonide-formoterol [facility]) 80-4.5 MCG/ACT inhaler Inhale 2 puffs into the lungs 2 (two) times daily. Patient not taking: Reported on 10/24/2015 10/09/15   Juanito Doom, MD  gabapentin (NEURONTIN) 100 MG capsule One pill @ bedtime as needed for back pain Patient not taking: Reported on 10/24/2015 09/06/15   Hendricks Limes, MD  sertraline (ZOLOFT) 25 MG tablet Take 1 tablet (25 mg total) by mouth daily. Patient not taking: Reported on 10/24/2015 04/19/15   Hendricks Limes, MD  traMADol (ULTRAM) 50 MG tablet Take 1 tablet (50 mg total) by mouth every 12 (twelve) hours as needed for moderate pain. 10/24/15   Dorie Rank, MD   BP 118/68 mmHg  Pulse 67  Temp(Src) 98.4 F (36.9 C) (Oral)  Resp 16  SpO2 88% Physical Exam  Constitutional: No distress.  Elderly, frail  HENT:  Head: Normocephalic and atraumatic.  Right Ear: External ear normal.  Left Ear: External ear normal.  Eyes: Conjunctivae are normal. Right eye exhibits no discharge. Left eye exhibits no discharge. No scleral icterus.  Neck: Neck supple. No tracheal deviation present.  Cardiovascular: Normal rate.    Pulmonary/Chest: Effort normal. No stridor. No respiratory distress. She has no wheezes.  Abdominal: Soft. She exhibits no distension. There is no tenderness.  Musculoskeletal: She exhibits no edema.       Right hip: She exhibits normal range of motion, no tenderness and no deformity.       Left hip: She exhibits normal range of motion, no tenderness and no deformity.       Cervical back: She exhibits no tenderness, no bony tenderness and no edema.       Thoracic back: She exhibits no tenderness, no bony tenderness and no edema.       Lumbar back: She exhibits tenderness and bony tenderness. She exhibits no swelling, no edema and no deformity.  No tenderness of the thoracic spine including the distal thoracic spine (t12 and above), tenderness palpation of the lumbar spine, increasing in severity towards the distal lumbar spine  Neurological: She is alert. Cranial nerve deficit: no gross deficits.  Skin: Skin is warm and dry. No rash noted.  Psychiatric: She has a normal mood and affect.  Nursing note and vitals reviewed.   ED Course  Procedures (including critical care time) Labs Review Labs Reviewed  CBC WITH DIFFERENTIAL/PLATELET - Abnormal; Notable for the following:    RBC 5.36 (*)    Hemoglobin 15.7 (*)    HCT 47.4 (*)    All other components within normal limits  BASIC METABOLIC PANEL - Abnormal; Notable for the following:    Glucose, Bld 115 (*)    GFR calc non Af Amer 55 (*)    All other components within normal limits  CBC  DIFFERENTIAL  I-STAT TROPOININ, ED    Imaging Review Ct Lumbar Spine Wo Contrast  10/24/2015  CLINICAL DATA:  [place] today and injured back. EXAM: CT LUMBAR SPINE WITHOUT  CONTRAST TECHNIQUE: Multidetector CT imaging of the lumbar spine was performed without intravenous contrast administration. Multiplanar CT image reconstructions were also generated. COMPARISON:  MRI 05/30/2015. FINDINGS: Normal alignment of the lumbar vertebral bodies. No acute  fracture is identified. The transverse and spinous processes are intact. Advanced multilevel facet disease most significant in the lower lumbar spine but no pars defects. Advanced degenerative disc disease at L5-S1 but the spinal canal is fairly generous and no obvious spinal stenosis. Stable diffuse bulging and slightly uncovered disc at L4-5 with moderate spinal and bilateral lateral recess stenosis. No foraminal stenosis. Mild extra foraminal encroachment on the left L4 nerve root. Stable laminectomy defect. The visualized upper pelvis is intact. No obvious sacral fractures. Severe osteoporosis. Extensive aortic calcifications but no significant retroperitoneal findings. Bilateral pleural effusions are noted along with calcified splenic granuloma. IMPRESSION: 1. No acute fracture involving the lumbar spine. 2. Stable disc disease and facet disease when compared to prior MRI. Stable spinal, lateral recess and left foraminal stenosis at L4-5. 3. Advanced atherosclerotic calcifications involving the aorta but no focal aneurysm. Electronically Signed   By: Marijo Sanes M.D.   On: 10/24/2015 12:02   I have personally reviewed and evaluated these images and lab results as part of my medical decision-making.   MDM   Final diagnoses:  Midline low back pain without sciatica  Fall, initial encounter    Patient presented to the emergency room for evaluation after a mechanical fall.  X-rays do not show any evidence of acute fracture. Laboratory tests are unremarkable. Patient is not anemic. She does not appear to be dehydrated.  She was given medications for pain. She was able to walk in the emergency department. When her discharged home with a prescription for Ultram. Follow up with her primary doctor.    Dorie Rank, MD 10/24/15 [phone]

## 2015-10-24 NOTE — ED Notes (Signed)
Per EMS: pt from home, pt was standing trying to put shoes on. Pt lost balalnced and fell backwards. Denies LOC or head injury. Pt c/o low thoracic, hugh lumbar pain. Pt had KED to assist with transfer to stretcher.

## 2015-10-24 NOTE — Discharge Instructions (Signed)

## 2015-10-24 NOTE — ED Notes (Signed)
Patient tried to ambulate in room. Patient stated that she feels sever pain in her back and became nauseated. Also patients oxygen dropped to 84 on 4L when standing.

## 2015-10-24 NOTE — ED Notes (Signed)
Bed: WA04 Expected date:  Expected time:  Means of arrival:  Comments: EMS-fall 

## 2015-10-24 NOTE — Progress Notes (Signed)
ED CM visited pt to check on any home needs.   Pt states she has a walker, bedside commode and shower bench Pt reports she fell backwards after trying to get her shoes on Husband at bedside

## 2015-10-27 ENCOUNTER — Telehealth: Payer: Self-pay | Admitting: Internal Medicine

## 2015-10-27 ENCOUNTER — Encounter: Payer: Self-pay | Admitting: Internal Medicine

## 2015-10-27 ENCOUNTER — Ambulatory Visit (INDEPENDENT_AMBULATORY_CARE_PROVIDER_SITE_OTHER): Payer: Medicare Other | Admitting: Internal Medicine

## 2015-10-27 VITALS — BP 118/60 | HR 69 | Temp 97.6°F | Ht 68.0 in | Wt 129.0 lb

## 2015-10-27 DIAGNOSIS — Z9181 History of falling: Secondary | ICD-10-CM

## 2015-10-27 DIAGNOSIS — M546 Pain in thoracic spine: Secondary | ICD-10-CM

## 2015-10-27 MED ORDER — TRAMADOL HCL 50 MG PO TABS: 50.0000 mg | ORAL_TABLET | Freq: Four times a day (QID) | ORAL | Status: DC | PRN

## 2015-10-27 NOTE — Progress Notes (Signed)
   Subjective:    Patient ID: Kristine Simmons, female    DOB: 08/31/1927, 81 y.o.   MRN: [id number]  HPI The 10/24/15 ER visit was reviewed. She was trying to put on her shoe while standing and lost her balance and fell. She landed on her back. She states she felt a "crack" and experienced pain in the lower back. There was no cardiac or neurologic prodrome prior to the fall. She was transported to the emergency room by EMS. There was no associated head injury or loss of consciousness. Imaging revealed no fracture of the lumbar spine. She had stable disc disease and facet disease.  She now describes diffuse pain in the thorax anteriorly and posteriorly. This has not been controlled with tramadol every 12 hours. She describes pain is in the mid back and up to level X on scale of 10. It extends from the bra line to the lumbosacral area.  Review of Systems Fever, chills, sweats, or unexplained weight loss not present. Mental status change or memory loss denied. Vision loss is chronic. There is no numbness, tingling, or weakness in extremities.   No loss of control of bladder or bowels. Radicular type pain absent. No seizure stigmata.    Objective:   Physical Exam  Pertinent or positive findings include:. She is on nasal oxygen. There is slight variation in the rhythm but no significant dysrhythmia. She complains of pain in the thorax posteriorly and anteriorly with movement. Pedal pulses are decreased but palpable. General appearance : Thin but adequately nourished; in no distress but anxious.  Eyes: No conjunctival inflammation or scleral icterus is present.  Oral exam:  Lips and gums are healthy appearing.There is no oropharyngeal erythema or exudate noted.   Heart:  S1 and S2 normal without gallop, murmur, click, rub or other extra sounds    Lungs:Chest clear to auscultation; no wheezes, rhonchi,rales ,or rubs present.No increased work of breathing.   Abdomen: bowel sounds normal, soft  and non-tender without masses, organomegaly or hernias noted.  No guarding or rebound.   Vascular : all pulses equal   Skin:Warm & dry.  Intact without suspicious lesions or rashes ; no tenting or jaundice   Lymphatic: No lymphadenopathy is noted about the head, neck, axilla.   Neuro: Strength, tone dramatically reduced.     Assessment & Plan:  #1 thoracic pain post mechanical fall    plan: See orders and after visit summary. Tramadol will be increased to every 6 hours as needed. It is noted that she is on sertraline but only at 25 mg dose. I think the risk of serotonin syndrome is low at that dose even with increased frequency of the tramadol. A TENS unit will also be recommended. If symptoms persist; a nuclear bone scan would be necessary to evaluate the diffuse skeletal type pain.

## 2015-10-27 NOTE — Telephone Encounter (Signed)
Patient explained that she had lost pills since she filled them earlier today---per dr hopper, he was willing to write another rx only for 30 pills, but patient states she has already checked with her pharmacy and her pharmacy will not fill new rx this soon, per dr hopper, patient needs to take tylenol for pain because she is on blood thinner, not to use ibuprofen, only use 2000g total of tylenol per day---patient repeated back for understanding

## 2015-10-27 NOTE — Telephone Encounter (Signed)
Patient seen Dr. Linus Orn today and she needs to talk with you regarding the prescription traMADol (ULTRAM) 50 MG tablet [[id number] that was prescribed. She has a problem with it but wouldn't tell my why.  Can you please call her asap

## 2015-10-27 NOTE — Patient Instructions (Signed)
Tramadol can be taken every 6 hours as needed for pain. Please employ aTENS unit which can be purchased at the pharmacy. This should be applied at the area of injury.

## 2015-10-27 NOTE — Progress Notes (Signed)
Pre visit review using our clinic review tool, if applicable. No additional management support is needed unless otherwise documented below in the visit note. 

## 2015-10-30 ENCOUNTER — Encounter (HOSPITAL_COMMUNITY): Payer: Self-pay | Admitting: Emergency Medicine

## 2015-10-30 ENCOUNTER — Emergency Department (HOSPITAL_COMMUNITY): Payer: Medicare Other

## 2015-10-30 ENCOUNTER — Emergency Department (HOSPITAL_COMMUNITY)
Admission: EM | Admit: 2015-10-30 | Discharge: 2015-10-30 | Disposition: A | Discharge status: Home / Self Care | Payer: Medicare Other | Source: Home / Self Care | Attending: Emergency Medicine | Admitting: Emergency Medicine

## 2015-10-30 DIAGNOSIS — Z7951 Long term (current) use of inhaled steroids: Secondary | ICD-10-CM | POA: Insufficient documentation

## 2015-10-30 DIAGNOSIS — Z87448 Personal history of other diseases of urinary system: Secondary | ICD-10-CM | POA: Insufficient documentation

## 2015-10-30 DIAGNOSIS — S22080A Wedge compression fracture of T11-T12 vertebra, initial encounter for closed fracture: Secondary | ICD-10-CM | POA: Insufficient documentation

## 2015-10-30 DIAGNOSIS — H540 Blindness, both eyes: Secondary | ICD-10-CM

## 2015-10-30 DIAGNOSIS — Y9289 Other specified places as the place of occurrence of the external cause: Secondary | ICD-10-CM

## 2015-10-30 DIAGNOSIS — W1839XA Other fall on same level, initial encounter: Secondary | ICD-10-CM

## 2015-10-30 DIAGNOSIS — Y998 Other external cause status: Secondary | ICD-10-CM

## 2015-10-30 DIAGNOSIS — Z86010 Personal history of colonic polyps: Secondary | ICD-10-CM

## 2015-10-30 DIAGNOSIS — Z8673 Personal history of transient ischemic attack (TIA), and cerebral infarction without residual deficits: Secondary | ICD-10-CM | POA: Insufficient documentation

## 2015-10-30 DIAGNOSIS — I5031 Acute diastolic (congestive) heart failure: Secondary | ICD-10-CM | POA: Diagnosis not present

## 2015-10-30 DIAGNOSIS — Z79899 Other long term (current) drug therapy: Secondary | ICD-10-CM

## 2015-10-30 DIAGNOSIS — Y9389 Activity, other specified: Secondary | ICD-10-CM | POA: Insufficient documentation

## 2015-10-30 DIAGNOSIS — Z87891 Personal history of nicotine dependence: Secondary | ICD-10-CM | POA: Insufficient documentation

## 2015-10-30 DIAGNOSIS — E039 Hypothyroidism, unspecified: Secondary | ICD-10-CM | POA: Insufficient documentation

## 2015-10-30 DIAGNOSIS — M810 Age-related osteoporosis without current pathological fracture: Secondary | ICD-10-CM

## 2015-10-30 DIAGNOSIS — Z8781 Personal history of (healed) traumatic fracture: Secondary | ICD-10-CM | POA: Insufficient documentation

## 2015-10-30 LAB — URINALYSIS, ROUTINE W REFLEX MICROSCOPIC
Bilirubin Urine: NEGATIVE
GLUCOSE, UA: NEGATIVE mg/dL
HGB URINE DIPSTICK: NEGATIVE
Ketones, ur: NEGATIVE mg/dL
Nitrite: NEGATIVE
PH: 6.5 (ref 5.0–8.0)
Protein, ur: NEGATIVE mg/dL
Specific Gravity, Urine: 1.014 (ref 1.005–1.030)
Urobilinogen, UA: 1 mg/dL (ref 0.0–1.0)

## 2015-10-30 LAB — URINE MICROSCOPIC-ADD ON

## 2015-10-30 LAB — CBC WITH DIFFERENTIAL/PLATELET
Basophils Absolute: 0.1 10*3/uL (ref 0.0–0.1)
Basophils Relative: 1 %
EOS PCT: 3 %
Eosinophils Absolute: 0.2 10*3/uL (ref 0.0–0.7)
HEMATOCRIT: 45.2 % (ref 36.0–46.0)
Hemoglobin: 14.7 g/dL (ref 12.0–15.0)
LYMPHS PCT: 16 %
Lymphs Abs: 1.2 10*3/uL (ref 0.7–4.0)
MCH: 28.8 pg (ref 26.0–34.0)
MCHC: 32.5 g/dL (ref 30.0–36.0)
MCV: 88.5 fL (ref 78.0–100.0)
MONO ABS: 0.7 10*3/uL (ref 0.1–1.0)
MONOS PCT: 9 %
NEUTROS ABS: 5.2 10*3/uL (ref 1.7–7.7)
Neutrophils Relative %: 71 %
PLATELETS: 186 10*3/uL (ref 150–400)
RBC: 5.11 MIL/uL (ref 3.87–5.11)
RDW: 13.8 % (ref 11.5–15.5)
WBC: 7.3 10*3/uL (ref 4.0–10.5)

## 2015-10-30 LAB — BASIC METABOLIC PANEL
Anion gap: 9 (ref 5–15)
BUN: 16 mg/dL (ref 6–20)
CALCIUM: 9.5 mg/dL (ref 8.9–10.3)
CO2: 32 mmol/L (ref 22–32)
CREATININE: 0.81 mg/dL (ref 0.44–1.00)
Chloride: 97 mmol/L — ABNORMAL LOW (ref 101–111)
GFR calc Af Amer: >60 mL/min (ref >60)
GFR calc non Af Amer: >60 mL/min (ref >60)
GLUCOSE: 98 mg/dL (ref 65–99)
Potassium: 4.4 mmol/L (ref 3.5–5.1)
Sodium: 138 mmol/L (ref 135–145)

## 2015-10-30 MED ORDER — OXYCODONE-ACETAMINOPHEN 5-325 MG PO TABS
1.0000 | ORAL_TABLET | Freq: Once | ORAL | Status: AC
  Administered 2015-10-30: 1 via ORAL
  Filled 2015-10-30: qty 1

## 2015-10-30 MED ORDER — ONDANSETRON 4 MG PO TBDP
4.0000 mg | ORAL_TABLET | Freq: Once | ORAL | Status: AC
  Administered 2015-10-30: 4 mg via ORAL
  Filled 2015-10-30: qty 1

## 2015-10-30 MED ORDER — IOHEXOL 300 MG/ML  SOLN
100.0000 mL | Freq: Once | INTRAMUSCULAR | Status: AC | PRN
  Administered 2015-10-30: 80 mL via INTRAVENOUS

## 2015-10-30 MED ORDER — OXYCODONE-ACETAMINOPHEN 5-325 MG PO TABS: 1.0000 | ORAL_TABLET | Freq: Three times a day (TID) | ORAL | Status: DC | PRN

## 2015-10-30 MED ORDER — POLYETHYLENE GLYCOL 3350 17 GM/SCOOP PO POWD: 17.0000 g | Freq: Every day | ORAL | Status: AC

## 2015-10-30 NOTE — ED Notes (Signed)
Pt with TLSO brace intact.

## 2015-10-30 NOTE — ED Provider Notes (Signed)
Patient initially seen and evaluated by Dr. Oleta Mouse, found to have compression fracture of T12. She was fitted with TLSO brace and plain films repeated to make sure that there is adequate alignment. This has been done and alignment remains good. Patient has significant improvement in pain since applying the brace. She is to follow-up with neurosurgery in one week.  Delora Fuel, MD 94/32/00 [id number]

## 2015-10-30 NOTE — ED Provider Notes (Signed)
CSN: [id number]     Arrival date & time 10/30/15  1050 History   First MD Initiated Contact with Patient 10/30/15 1229     Chief Complaint  Patient presents with  . Back Pain     (Consider location/radiation/quality/duration/timing/severity/associated sxs/prior Treatment) HPI 81 year old female who presents with back pain. History of atrial fibrillation on Eliquis. Reports having a mechanical fall 6 days ago, landing on her back. She was seen in the ED on November 1, and had a negative CT lumbar spine. Was treated with tramadol, to good effect and was ambulatory. Subsequently was discharged home with tramadol. States that initially pain seemed to be well controlled with this medication, but for a few days she said that she could not find her medication cannot take this medication. Subsequently had worsening pain in her mid to low back. She found her tramadol 3 days ago, and began taking it again. This time pain was not well controlled with this medication. Pain is worse with movement and ambulation. She has not had any new numbness or weakness, loss of control of her bowel or bladder, urinary retention, fevers or chills, abdominal pain, hematuria/dysuria/urinary frequency, nausea or vomiting, or diarrhea. Says that she has had some generalized abdominal discomfort, and constipation, which is not atypical for her.  Past Medical History  Diagnosis Date  . Hx of colonic polyps     last colonoscopy 2005, repeat was due 2010 Dr. Sharlett Iles  . Atrial fibrillation ([place])     Dr Percival Spanish  . Fracture 1995, 2009    RUE; LUE Dr. Durward Fortes  . 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 Cystitis   . Complication of anesthesia     difficulty with arousal post op  . Hyperlipidemia   . Hyperlipidemia   . Osteoporosis   . Cerebrovascular accident ([place]) 01/09  . Hypothyroidism   . Dysrhythmia    Past Surgical History  Procedure Laterality Date  . Tonsillectomy and adenoidectomy    . Vocal  cord polyps    . Thyroid needle biopsy  2002  . Appendectomy    . Cataract extraction      [place]  . Colonoscopy w/ polypectomy  2005    Diverticulosis;Dr. Sharlett Iles  . Total hip arthroplasty  2008    CHF & RAF post op   . Total shoulder replacement  2010    Dr Durward Fortes - left shoulder  . Mohs surgery  2013    nasal Basal Cell; Dr Sarajane Jews  . Excision metacarpal mass Right 08/17/2013    Procedure: RIGHT LONG EXCISION MASS AND DIP JOINT DEBRIDEMENT;  Surgeon: Tennis Must, MD;  Location: [facility];  Service: Orthopedics;  Laterality: Right;  . Femoral hernia repair Right 08/17/2014    Procedure: OPEN REPAIR RIGHT FEMORAL HERNIA  WITH INSERTION OF MESH;  Surgeon: Adin Hector, MD;  Location: [facility];  Service: General;  Laterality: Right;  . Insertion of mesh  08/17/2014    Procedure: INSERTION OF MESH;  Surgeon: Adin Hector, MD;  Location: [facility];  Service: General;;  . Inguinal hernia repair Right 11/15/2014    Procedure: LAPAROSCOPIC RIGHT INGUINAL HERNIA WITH MESH;  Surgeon: Alphonsa Overall, MD;  Location: [facility];  Service: General;  Laterality: Right;   Family History  Problem Relation Age of Onset  . Stroke Father     > 14  . Diabetes Father   . Leukemia Mother   . Cancer Mother  leukemia  . Colon cancer Brother     Valve replacement  . Heart attack Brother   . Breast cancer Sister   . Ovarian cancer Sister   . Lung cancer Sister     smoker  . Prostate cancer Brother   . Cancer Brother     bladder   Social History  Substance Use Topics  . Smoking status: Former Smoker -- 1.00 packs/day for 45 years    Types: Cigarettes    Quit date: 12/23/1993  . Smokeless tobacco: Never Used     Comment: Smoked 1950-1995, up to 1 ppd  . Alcohol Use: No   OB History    No data available     Review of Systems 10/14 systems reviewed and are negative other than those stated in the HPI    Allergies  Hydrocodone; Desmopressin acetate; Dorzolamide hcl;  Prempro; and Ciprofloxacin  Home Medications   Prior to Admission medications   Medication Sig Start Date End Date Taking? Authorizing Provider  acetaminophen (TYLENOL) 500 MG tablet Take 1,000 mg by mouth every 6 (six) hours as needed for moderate pain.   Yes Historical Provider, MD  albuterol (VENTOLIN HFA) 108 (90 BASE) MCG/ACT inhaler Inhale 2 puffs into the lungs every 6 (six) hours as needed for wheezing or shortness of breath.   Yes Historical Provider, MD  apraclonidine (IOPIDINE) 0.5 % ophthalmic solution Place 1 drop into the right eye every 12 (twelve) hours.   Yes Historical Provider, MD  budesonide-formoterol (SYMBICORT) 80-4.5 MCG/ACT inhaler Inhale 2 puffs into the lungs 2 (two) times daily. 10/09/15  Yes Juanito Doom, MD  calcium carbonate (TUMS - DOSED IN MG ELEMENTAL CALCIUM) 500 MG chewable tablet Chew 2 tablets by mouth daily as needed for indigestion or heartburn (indigestion).   Yes Historical Provider, MD  Cholecalciferol (VITAMIN D) 2000 UNITS tablet Take 2,000 Units by mouth daily.   Yes Historical Provider, MD  Cranberry (CRANBERRY CONCENTRATE) 500 MG CAPS Take 500 mg by mouth 2 (two) times daily.    Yes Historical Provider, MD  DIGOX 125 MCG tablet TAKE 1 TABLET ONCE DAILY. Patient taking differently: TAKE 125 MCG BY MOUTH DAILY 05/26/15  Yes Minus Breeding, MD  diltiazem (CARDIZEM CD) 120 MG 24 hr capsule TAKE (1) CAPSULE DAILY. Patient taking differently: Take 120 mg by mouth daily.  07/04/15  Yes Minus Breeding, MD  ELIQUIS 2.5 MG TABS tablet TAKE 1 TABLET TWICE DAILY. Patient taking differently: TAKE 2.5 MG BY MOUTH TWICE DAILY 08/07/15  Yes Minus Breeding, MD  ENSURE PLUS (ENSURE PLUS) LIQD Take 1 Can by mouth daily. 12/20/14  Yes Hendricks Limes, MD  estradiol (ESTRACE) 0.1 MG/GM vaginal cream Place 2 g vaginally 2 (two) times a week. On Wednesdays & Saturdays.   Yes Historical Provider, MD  levothyroxine (SYNTHROID, LEVOTHROID) 50 MCG tablet TAKE 1 TABLET ONCE  DAILY. Patient taking differently: Take 50 mcg daily except take 75 mcg on Wednesday 05/25/15  Yes Hendricks Limes, MD  LORazepam (ATIVAN) 1 MG tablet TAKE 1 TABLET EVERY 8-12 HOURS AS NEEDED. CAN BE HABIT FORMING & MAY AFFECT MENTAL ALERTNESS. 11/23/14  Yes Biagio Borg, MD  LUMIGAN 0.01 % SOLN Place 1 drop into both eyes at bedtime. 04/06/15  Yes Historical Provider, MD  Magnesium Oxide 500 MG TABS Take 500 mg by mouth at bedtime.    Yes Historical Provider, MD  OXYGEN Inhale into the lungs continuous.   Yes Historical Provider, MD  Probiotic Product ([place]  REGULAT) CAPS Take 1 capsule by mouth daily.     Yes Historical Provider, MD  Propylene Glycol (SYSTANE BALANCE OP) Place 1 drop into both eyes as needed (dry and pain).   Yes Historical Provider, MD  RANITIDINE HCL PO Take 1 tablet by mouth daily as needed (heartburn).   Yes Historical Provider, MD  sertraline (ZOLOFT) 25 MG tablet Take 1 tablet (25 mg total) by mouth daily. 04/19/15  Yes Hendricks Limes, MD  sodium chloride (MURO 128) 5 % ophthalmic solution Place 1 drop into the left eye 3 (three) times daily.   Yes Historical Provider, MD  timolol (TIMOPTIC) 0.5 % ophthalmic solution Place 1 drop into both eyes 2 (two) times daily.    Yes Historical Provider, MD  traMADol (ULTRAM) 50 MG tablet Take 1 tablet (50 mg total) by mouth every 6 (six) hours as needed for moderate pain. 10/27/15  Yes Hendricks Limes, MD  trimethoprim (TRIMPEX) 100 MG tablet Take 100 mg by mouth every other day.    Yes Historical Provider, MD  VYTORIN 10-40 MG per tablet TAKE 1 TABLET ONCE DAILY. 05/15/15  Yes Hendricks Limes, MD  acetaminophen (TYLENOL) 325 MG tablet You can take 2 tablets every 4 hours as needed.  You cannot take more than 4000 mg of tylenol per day.  This is also in your sleep aide, and it is in your prescribed pain medicine. So you need to figure out.  Do not take more than 12 tablets of tylenol (acetaminophen) per day. Patient not  taking: Reported on 10/30/2015 08/19/14   Earnstine Regal, PA-C  gabapentin (NEURONTIN) 100 MG capsule One pill @ bedtime as needed for back pain Patient not taking: Reported on 10/30/2015 09/06/15   Hendricks Limes, MD  oxyCODONE-acetaminophen (PERCOCET/ROXICET) 5-325 MG tablet Take 1 tablet by mouth every 8 (eight) hours as needed for moderate pain or severe pain. 10/30/15   Forde Dandy, MD  polyethylene glycol powder (GLYCOLAX/MIRALAX) powder Take 17 g by mouth daily. Mix 1 capful of powder and dissolve it in any liquid. Drink once daily. 10/30/15   Forde Dandy, MD   BP 100/48 mmHg  Pulse 91  Temp(Src) 97.6 F (36.4 C) (Oral)  Resp 18  SpO2 91% Physical Exam Physical Exam  Nursing note and vitals reviewed. Constitutional: Elderly appearing woman, well developed, well nourished, non-toxic, and in no acute distress Head: Normocephalic and atraumatic.  Mouth/Throat: Oropharynx is clear and moist.  Neck: Normal range of motion. Neck supple. No cervical spine tenderness. Cardiovascular: Normal rate and regular rhythm.   Pulmonary/Chest: Effort normal and breath sounds normal. No chest wall tenderness. Abdominal: Soft. There is mild generalized tenderness. No CVA tenderness. There is no rebound and no guarding.  Musculoskeletal: Tender to palpation in upper lumbar spine.  Neurological: Alert, no facial droop, fluent speech, moves all extremities symmetrically, sensation to light touch in tact in bilateral lower extremity, +2 patellar reflexes bilaterally Skin: Skin is warm and dry.  Psychiatric: Cooperative  ED Course  Procedures (including critical care time) Labs Review Labs Reviewed  URINALYSIS, ROUTINE W REFLEX MICROSCOPIC (NOT AT [facility]) - Abnormal; Notable for the following:    APPearance CLOUDY (*)    Leukocytes, UA TRACE (*)    All other components within normal limits  BASIC METABOLIC PANEL - Abnormal; Notable for the following:    Chloride 97 (*)    All other components within  normal limits  URINE MICROSCOPIC-ADD ON - Abnormal; Notable for the following:  Squamous Epithelial / LPF FEW (*)    Casts GRANULAR CAST (*)    All other components within normal limits  CBC WITH DIFFERENTIAL/PLATELET    Imaging Review Ct Abdomen Pelvis W Contrast  10/30/2015  CLINICAL DATA:  Patient fell 10/24/2015 with low back pain after the fall, now with mid abdominal pain, patient anticoagulated EXAM: CT ABDOMEN AND PELVIS WITH CONTRAST TECHNIQUE: Multidetector CT imaging of the abdomen and pelvis was performed using the standard protocol following bolus administration of intravenous contrast. CONTRAST:  41mL OMNIPAQUE IOHEXOL 300 MG/ML  SOLN COMPARISON:  10/24/15, 05/30/2015 FINDINGS: Lower chest: There are moderate bilateral pleural effusions. Pleural fluid is seen on the prior CT scan. There is bilateral posterior lower lobe consolidation most consistent with atelectasis. There is mild cardiac enlargement. Hepatobiliary: Mild hepatic steatosis. Several tiny hepatic calcifications. Small focus of relative sparing from steatosis near the falciform ligament. Pancreas: Normal Spleen: Large number of small calcifications consistent with prior granulomatous exposure Adrenals/Urinary Tract: Adrenal glands are normal. No acute renal abnormalities. Bladder grossly normal although largely obscured by beam artifact from right hip replacement. Stomach/Bowel: Stomach and small bowel are normal. There is a moderate to large volume of stool retained throughout the colon. There is sigmoid colon diverticulosis. Vascular/Lymphatic: There is severe atherosclerotic aortoiliac calcification. No significant adenopathy. Reproductive: Limited evaluation of reproductive organs given artifact described above. Reproductive organs are grossly normal. Other: No free fluid or free air in the abdomen or pelvis. Musculoskeletal: No evidence of abdominal wall hematoma. Moderate T12 compression deformity with some retropulsion  causing moderate canal narrowing. This compression deformity is new from 05/30/2015. The T12 vertebral body with not included fully on 10/24/2015 CT scan of the lumbar spine. IMPRESSION: No evidence of abdominal or pelvic hematoma. Moderate T12 compression deformity associated with canal narrowing due to retropulsion. This is new from 05/30/2015. Bilateral pleural effusions with underlying atelectasis Hepatic and splenic calcifications. Electronically Signed   By: Skipper Cliche M.D.   On: 10/30/2015 14:53   I have personally reviewed and evaluated these images and lab results as part of my medical decision-making.   MDM   Final diagnoses:  T12 compression fracture, initial encounter [facility])    81 year old female with history of A. fib on Eliquis who presents with persistent back pain after fall 1 week ago. On presentation, she is nontoxic in no acute distress. She is neurologically intact. Pain in the upper lumbar/lower thoracic regions of her spine. Given persistent symptoms with mild abdominal discomfort, concern for retroperitoneal/abdominal bleeding in setting of fall on blood thinner. CT abd/pelvis performed. No acute retroperitoneal processes and no intraabdominal processes. She does has T12 compression fracture with mild retropulsion. NO evidence of urinary retention and no neurological deficits. No spinal cord involvement suspected. Discussed with Dr. Saintclair Halsted from neurosurgery who will see patient in one week. Fitted for TLSO brace and able to ambulate steadily with this. Follow-up instructions with Dr. Saintclair Halsted reviewed and use of TLSO brace reviewed. Strict return instructions also discussed, including concerning symptoms of spinal cord involvement. She and family expressed understanding of all discharge instructions and felt comfortable with the plan of care.     Forde Dandy, MD 10/30/15 [phone]

## 2015-10-30 NOTE — ED Notes (Signed)
Bed: WP10 Expected date:  Expected time:  Means of arrival:  Comments: Hold for triage 8

## 2015-10-30 NOTE — ED Notes (Signed)
Patient needs oxygen to go home with.  Husband went to get her oxygen.

## 2015-10-30 NOTE — ED Notes (Signed)
Per EMS-patient fell ton the 1 st-was seen for back pain-states still having pain-per EMS appears to be seeking narcotics-states she was given trmadol

## 2015-10-30 NOTE — Discharge Instructions (Signed)
Please where your brace whenever you are sitting up or moving/walking. He may take the brace off when you are lying flat to sleep at night. Please return without fail for worsening symptoms, including worsening pain, numbness or weakness in your legs, feeling like you are retaining your urine, loss of control of your bowel or urine, or any other symptoms concerning to you. Stop taking your tramadol, and only take Percocet for pain control. He will also need to be on a bowel regimen while you're taking this pain medication.  Vertebral Fracture A vertebral fracture means that one of the bones in the spine is broken (fractured). These bones are called vertebrae. You may have back pain that gets worse when you move. Vertebral fractures can be mild or severe. Many will get better without surgery. Surgery may be needed for more severe breaks. HOME CARE General Instructions  Take medicines only as told by your doctor.  Do not drive or use heavy machinery while taking pain medicine.  If told, put ice on the injured area:  Put ice in a plastic bag.  Place a towel between your skin and the bag.  Leave the ice on for 30 minutes every 2 hours at first. Then use the ice as needed.  Wear your neck brace or back brace as told by your doctor.  Do not drink alcohol.  Keep all follow-up visits as told by your doctor. This is important. Activity  Stay in bed (on bed rest) only as told by your doctor. Being on bed rest for too long can make your condition worse.  Return to your normal activities as told by your doctor. Ask your doctor what is safe for you to do.  Do exercises for your back (physical therapy) as told by your doctor.  Exercise often as told by your doctor. GET HELP IF:  You have a fever.  You have a cough that makes your pain worse.  Your pain medicine is not helping.  Your pain does not get better over time.  You cannot return to your normal activities as planned. GET HELP  RIGHT AWAY IF:  Your pain is very bad and it suddenly gets worse.  You are not able to move any body part (paralysis) that is below the level of your injury.  You have numbness, tingling, or weakness in any body part that is below the level of your injury.  You cannot control when you pee (urinate) or when you poop (have bowel movements).   This information is not intended to replace advice given to you by your health care provider. Make sure you discuss any questions you have with your health care provider.   Document Released: 05/29/2010 Document Revised: 04/25/2015 Document Reviewed: 12/14/2014 Elsevier Interactive Patient Education [employer].

## 2015-11-01 ENCOUNTER — Telehealth: Payer: Self-pay | Admitting: Cardiology

## 2015-11-01 ENCOUNTER — Telehealth: Payer: Self-pay | Admitting: Internal Medicine

## 2015-11-01 NOTE — Telephone Encounter (Signed)
Actually, [facility] Medicare called with patient and we scheduled her to come in to see Dr. Linus Orn tomorrow. I talked with daughter and I let her know about the appointment.

## 2015-11-01 NOTE — Telephone Encounter (Signed)
Does she need to be seen first? Please advise, thanks.

## 2015-11-01 NOTE — Telephone Encounter (Signed)
She can wait for Dr. Clayborn Heron return to see if he will place. Given that she has medicare they require a face to face for orders and may be easiest if she has visit.

## 2015-11-01 NOTE — Telephone Encounter (Signed)
New message  Request a call back for Ejection fraction and confirm the diagnosis for heart failure

## 2015-11-01 NOTE — Telephone Encounter (Signed)
Patient has recently fallen and she does have a cracked vertebra and ER has given her a brace to put on. Daughter, Kristine Simmons is requesting some home health care since she doesn't live too close and also to be sure she is taking her medication. Please call Dotty at [phone]

## 2015-11-02 ENCOUNTER — Encounter (HOSPITAL_COMMUNITY): Payer: Self-pay | Admitting: Emergency Medicine

## 2015-11-02 ENCOUNTER — Ambulatory Visit (INDEPENDENT_AMBULATORY_CARE_PROVIDER_SITE_OTHER): Payer: Medicare Other | Admitting: Internal Medicine

## 2015-11-02 ENCOUNTER — Inpatient Hospital Stay (HOSPITAL_COMMUNITY)
Admission: EM | Admit: 2015-11-02 | Discharge: 2015-11-08 | DRG: 291 | Disposition: A | Discharge status: Skilled Nursing Facility | Payer: Medicare Other | Attending: Internal Medicine | Admitting: Internal Medicine

## 2015-11-02 ENCOUNTER — Encounter: Payer: Self-pay | Admitting: Internal Medicine

## 2015-11-02 ENCOUNTER — Emergency Department (HOSPITAL_COMMUNITY): Payer: Medicare Other

## 2015-11-02 VITALS — BP 100/56 | HR 64 | Resp 15 | Wt 135.0 lb

## 2015-11-02 DIAGNOSIS — J449 Chronic obstructive pulmonary disease, unspecified: Secondary | ICD-10-CM | POA: Diagnosis present

## 2015-11-02 DIAGNOSIS — Z7901 Long term (current) use of anticoagulants: Secondary | ICD-10-CM | POA: Diagnosis not present

## 2015-11-02 DIAGNOSIS — E871 Hypo-osmolality and hyponatremia: Secondary | ICD-10-CM | POA: Diagnosis present

## 2015-11-02 DIAGNOSIS — I350 Nonrheumatic aortic (valve) stenosis: Secondary | ICD-10-CM | POA: Diagnosis present

## 2015-11-02 DIAGNOSIS — E039 Hypothyroidism, unspecified: Secondary | ICD-10-CM | POA: Diagnosis present

## 2015-11-02 DIAGNOSIS — R945 Abnormal results of liver function studies: Secondary | ICD-10-CM

## 2015-11-02 DIAGNOSIS — Z7951 Long term (current) use of inhaled steroids: Secondary | ICD-10-CM | POA: Diagnosis not present

## 2015-11-02 DIAGNOSIS — H540 Blindness, both eyes: Secondary | ICD-10-CM | POA: Diagnosis present

## 2015-11-02 DIAGNOSIS — R0902 Hypoxemia: Secondary | ICD-10-CM

## 2015-11-02 DIAGNOSIS — Z79899 Other long term (current) drug therapy: Secondary | ICD-10-CM

## 2015-11-02 DIAGNOSIS — I499 Cardiac arrhythmia, unspecified: Secondary | ICD-10-CM | POA: Diagnosis not present

## 2015-11-02 DIAGNOSIS — J90 Pleural effusion, not elsewhere classified: Secondary | ICD-10-CM | POA: Diagnosis not present

## 2015-11-02 DIAGNOSIS — Z8673 Personal history of transient ischemic attack (TIA), and cerebral infarction without residual deficits: Secondary | ICD-10-CM

## 2015-11-02 DIAGNOSIS — I482 Chronic atrial fibrillation, unspecified: Secondary | ICD-10-CM

## 2015-11-02 DIAGNOSIS — Z7189 Other specified counseling: Secondary | ICD-10-CM | POA: Diagnosis not present

## 2015-11-02 DIAGNOSIS — R7989 Other specified abnormal findings of blood chemistry: Secondary | ICD-10-CM

## 2015-11-02 DIAGNOSIS — Z9981 Dependence on supplemental oxygen: Secondary | ICD-10-CM

## 2015-11-02 DIAGNOSIS — R0600 Dyspnea, unspecified: Secondary | ICD-10-CM

## 2015-11-02 DIAGNOSIS — Z87891 Personal history of nicotine dependence: Secondary | ICD-10-CM

## 2015-11-02 DIAGNOSIS — I5031 Acute diastolic (congestive) heart failure: Principal | ICD-10-CM | POA: Diagnosis present

## 2015-11-02 DIAGNOSIS — J438 Other emphysema: Secondary | ICD-10-CM

## 2015-11-02 DIAGNOSIS — I4891 Unspecified atrial fibrillation: Secondary | ICD-10-CM | POA: Diagnosis not present

## 2015-11-02 DIAGNOSIS — N189 Chronic kidney disease, unspecified: Secondary | ICD-10-CM | POA: Diagnosis present

## 2015-11-02 DIAGNOSIS — Z885 Allergy status to narcotic agent status: Secondary | ICD-10-CM

## 2015-11-02 DIAGNOSIS — R740 Nonspecific elevation of levels of transaminase and lactic acid dehydrogenase [LDH]: Secondary | ICD-10-CM | POA: Diagnosis present

## 2015-11-02 DIAGNOSIS — Z823 Family history of stroke: Secondary | ICD-10-CM

## 2015-11-02 DIAGNOSIS — Z96641 Presence of right artificial hip joint: Secondary | ICD-10-CM | POA: Diagnosis present

## 2015-11-02 DIAGNOSIS — N179 Acute kidney failure, unspecified: Secondary | ICD-10-CM | POA: Diagnosis present

## 2015-11-02 DIAGNOSIS — J9601 Acute respiratory failure with hypoxia: Secondary | ICD-10-CM | POA: Insufficient documentation

## 2015-11-02 DIAGNOSIS — Z79891 Long term (current) use of opiate analgesic: Secondary | ICD-10-CM | POA: Diagnosis not present

## 2015-11-02 DIAGNOSIS — R1313 Dysphagia, pharyngeal phase: Secondary | ICD-10-CM | POA: Diagnosis present

## 2015-11-02 DIAGNOSIS — E875 Hyperkalemia: Secondary | ICD-10-CM | POA: Diagnosis present

## 2015-11-02 DIAGNOSIS — M810 Age-related osteoporosis without current pathological fracture: Secondary | ICD-10-CM | POA: Diagnosis present

## 2015-11-02 DIAGNOSIS — M4854XA Collapsed vertebra, not elsewhere classified, thoracic region, initial encounter for fracture: Secondary | ICD-10-CM | POA: Diagnosis present

## 2015-11-02 DIAGNOSIS — I130 Hypertensive heart and chronic kidney disease with heart failure and stage 1 through stage 4 chronic kidney disease, or unspecified chronic kidney disease: Secondary | ICD-10-CM | POA: Diagnosis not present

## 2015-11-02 DIAGNOSIS — Z888 Allergy status to other drugs, medicaments and biological substances status: Secondary | ICD-10-CM

## 2015-11-02 DIAGNOSIS — Z96612 Presence of left artificial shoulder joint: Secondary | ICD-10-CM | POA: Diagnosis present

## 2015-11-02 DIAGNOSIS — S22080A Wedge compression fracture of T11-T12 vertebra, initial encounter for closed fracture: Secondary | ICD-10-CM

## 2015-11-02 DIAGNOSIS — E876 Hypokalemia: Secondary | ICD-10-CM | POA: Diagnosis not present

## 2015-11-02 DIAGNOSIS — Z515 Encounter for palliative care: Secondary | ICD-10-CM | POA: Diagnosis not present

## 2015-11-02 DIAGNOSIS — M4854XD Collapsed vertebra, not elsewhere classified, thoracic region, subsequent encounter for fracture with routine healing: Secondary | ICD-10-CM | POA: Diagnosis not present

## 2015-11-02 DIAGNOSIS — I1310 Hypertensive heart and chronic kidney disease without heart failure, with stage 1 through stage 4 chronic kidney disease, or unspecified chronic kidney disease: Secondary | ICD-10-CM | POA: Diagnosis present

## 2015-11-02 DIAGNOSIS — R0603 Acute respiratory distress: Secondary | ICD-10-CM | POA: Insufficient documentation

## 2015-11-02 DIAGNOSIS — J690 Pneumonitis due to inhalation of food and vomit: Secondary | ICD-10-CM | POA: Diagnosis present

## 2015-11-02 DIAGNOSIS — J9621 Acute and chronic respiratory failure with hypoxia: Secondary | ICD-10-CM | POA: Diagnosis present

## 2015-11-02 DIAGNOSIS — Z66 Do not resuscitate: Secondary | ICD-10-CM | POA: Diagnosis not present

## 2015-11-02 DIAGNOSIS — E785 Hyperlipidemia, unspecified: Secondary | ICD-10-CM | POA: Diagnosis present

## 2015-11-02 HISTORY — DX: Wedge compression fracture of T11-T12 vertebra, initial encounter for closed fracture: S22.080A

## 2015-11-02 LAB — DIGOXIN LEVEL: DIGOXIN LVL: 1.4 ng/mL (ref 0.8–2.0)

## 2015-11-02 LAB — BLOOD GAS, ARTERIAL
ACID-BASE EXCESS: 6.7 mmol/L — AB (ref 0.0–2.0)
Bicarbonate: 31 mEq/L — ABNORMAL HIGH (ref 20.0–24.0)
DRAWN BY: 24486
FIO2: 0.7
O2 Saturation: 88.2 %
PCO2 ART: 46.1 mmHg — AB (ref 35.0–45.0)
PH ART: 7.44 (ref 7.350–7.450)
Patient temperature: 97.5
TCO2: 32.5 mmol/L (ref 0–100)
pO2, Arterial: 55.2 mmHg — ABNORMAL LOW (ref 80.0–100.0)

## 2015-11-02 LAB — CBC WITH DIFFERENTIAL/PLATELET
BASOS ABS: 0.1 10*3/uL (ref 0.0–0.1)
Basophils Relative: 1 %
EOS PCT: 0 %
Eosinophils Absolute: 0 10*3/uL (ref 0.0–0.7)
HEMATOCRIT: 42.6 % (ref 36.0–46.0)
Hemoglobin: 13.3 g/dL (ref 12.0–15.0)
LYMPHS ABS: 1.1 10*3/uL (ref 0.7–4.0)
LYMPHS PCT: 11 %
MCH: 28.2 pg (ref 26.0–34.0)
MCHC: 31.2 g/dL (ref 30.0–36.0)
MCV: 90.4 fL (ref 78.0–100.0)
MONO ABS: 1 10*3/uL (ref 0.1–1.0)
Monocytes Relative: 11 %
NEUTROS ABS: 7.3 10*3/uL (ref 1.7–7.7)
Neutrophils Relative %: 77 %
PLATELETS: 199 10*3/uL (ref 150–400)
RBC: 4.71 MIL/uL (ref 3.87–5.11)
RDW: 14.2 % (ref 11.5–15.5)
WBC: 9.4 10*3/uL (ref 4.0–10.5)

## 2015-11-02 LAB — COMPREHENSIVE METABOLIC PANEL
ALT: 319 U/L — AB (ref 14–54)
ANION GAP: 8 (ref 5–15)
AST: 292 U/L — AB (ref 15–41)
Albumin: 3.4 g/dL — ABNORMAL LOW (ref 3.5–5.0)
Alkaline Phosphatase: 61 U/L (ref 38–126)
BILIRUBIN TOTAL: 1.2 mg/dL (ref 0.3–1.2)
BUN: 31 mg/dL — AB (ref 6–20)
CO2: 32 mmol/L (ref 22–32)
Calcium: 9.3 mg/dL (ref 8.9–10.3)
Chloride: 94 mmol/L — ABNORMAL LOW (ref 101–111)
Creatinine, Ser: 1.13 mg/dL — ABNORMAL HIGH (ref 0.44–1.00)
GFR calc Af Amer: 50 mL/min — ABNORMAL LOW (ref >60)
GFR calc non Af Amer: 43 mL/min — ABNORMAL LOW (ref >60)
GLUCOSE: 134 mg/dL — AB (ref 65–99)
POTASSIUM: 5.6 mmol/L — AB (ref 3.5–5.1)
SODIUM: 134 mmol/L — AB (ref 135–145)
TOTAL PROTEIN: 6.1 g/dL — AB (ref 6.5–8.1)

## 2015-11-02 LAB — TROPONIN I: Troponin I: 0.04 ng/mL — ABNORMAL HIGH (ref <0.031)

## 2015-11-02 LAB — ACETAMINOPHEN LEVEL

## 2015-11-02 LAB — BRAIN NATRIURETIC PEPTIDE: B Natriuretic Peptide: 1237.6 pg/mL — ABNORMAL HIGH (ref 0.0–100.0)

## 2015-11-02 MED ORDER — SERTRALINE HCL 25 MG PO TABS
25.0000 mg | ORAL_TABLET | Freq: Every day | ORAL | Status: DC
  Administered 2015-11-03 – 2015-11-08 (×6): 25 mg via ORAL
  Filled 2015-11-02 (×6): qty 1

## 2015-11-02 MED ORDER — SODIUM CHLORIDE 0.9 % IV SOLN
3.0000 g | Freq: Three times a day (TID) | INTRAVENOUS | Status: DC
  Administered 2015-11-03 – 2015-11-06 (×11): 3 g via INTRAVENOUS
  Filled 2015-11-02 (×11): qty 3

## 2015-11-02 MED ORDER — ONDANSETRON HCL 4 MG PO TABS: 4.0000 mg | ORAL_TABLET | Freq: Four times a day (QID) | ORAL | Status: DC | PRN

## 2015-11-02 MED ORDER — TIMOLOL MALEATE 0.5 % OP SOLN
1.0000 [drp] | Freq: Two times a day (BID) | OPHTHALMIC | Status: DC
  Administered 2015-11-03 – 2015-11-08 (×12): 1 [drp] via OPHTHALMIC
  Filled 2015-11-02: qty 5

## 2015-11-02 MED ORDER — DIGOXIN 125 MCG PO TABS
125.0000 ug | ORAL_TABLET | Freq: Every day | ORAL | Status: DC
  Administered 2015-11-03 – 2015-11-08 (×6): 125 ug via ORAL
  Filled 2015-11-02 (×6): qty 1

## 2015-11-02 MED ORDER — DILTIAZEM HCL ER COATED BEADS 120 MG PO CP24
120.0000 mg | ORAL_CAPSULE | Freq: Every day | ORAL | Status: DC
  Administered 2015-11-03 – 2015-11-08 (×6): 120 mg via ORAL
  Filled 2015-11-02 (×6): qty 1

## 2015-11-02 MED ORDER — APRACLONIDINE HCL 0.5 % OP SOLN
1.0000 [drp] | Freq: Two times a day (BID) | OPHTHALMIC | Status: DC
  Administered 2015-11-03 – 2015-11-08 (×10): 1 [drp] via OPHTHALMIC
  Filled 2015-11-02 (×2): qty 5

## 2015-11-02 MED ORDER — APIXABAN 2.5 MG PO TABS
2.5000 mg | ORAL_TABLET | Freq: Two times a day (BID) | ORAL | Status: DC
  Administered 2015-11-03 – 2015-11-08 (×12): 2.5 mg via ORAL
  Filled 2015-11-02 (×12): qty 1

## 2015-11-02 MED ORDER — FUROSEMIDE 10 MG/ML IJ SOLN: 40.0000 mg | Freq: Every day | INTRAMUSCULAR | Status: DC

## 2015-11-02 MED ORDER — FUROSEMIDE 10 MG/ML IJ SOLN
40.0000 mg | Freq: Once | INTRAMUSCULAR | Status: AC
  Administered 2015-11-02: 40 mg via INTRAVENOUS
  Filled 2015-11-02: qty 4

## 2015-11-02 MED ORDER — SODIUM CHLORIDE 0.9 % IV SOLN: INTRAVENOUS | Status: DC

## 2015-11-02 MED ORDER — APRACLONIDINE HCL 0.5 % OP SOLN
1.0000 [drp] | Freq: Two times a day (BID) | OPHTHALMIC | Status: DC
  Filled 2015-11-02: qty 5

## 2015-11-02 MED ORDER — SODIUM CHLORIDE (HYPERTONIC) 5 % OP SOLN
1.0000 [drp] | Freq: Three times a day (TID) | OPHTHALMIC | Status: DC
  Administered 2015-11-03 – 2015-11-08 (×16): 1 [drp] via OPHTHALMIC
  Filled 2015-11-02: qty 15

## 2015-11-02 MED ORDER — EZETIMIBE-SIMVASTATIN 10-40 MG PO TABS
1.0000 | ORAL_TABLET | Freq: Every day | ORAL | Status: DC
  Administered 2015-11-03: 1 via ORAL
  Filled 2015-11-02: qty 1

## 2015-11-02 MED ORDER — TRAMADOL HCL 50 MG PO TABS
50.0000 mg | ORAL_TABLET | Freq: Once | ORAL | Status: AC
  Administered 2015-11-02: 50 mg via ORAL
  Filled 2015-11-02: qty 1

## 2015-11-02 MED ORDER — ONDANSETRON HCL 4 MG/2ML IJ SOLN: 4.0000 mg | Freq: Four times a day (QID) | INTRAMUSCULAR | Status: DC | PRN

## 2015-11-02 MED ORDER — LEVOTHYROXINE SODIUM 50 MCG PO TABS
50.0000 ug | ORAL_TABLET | Freq: Every day | ORAL | Status: DC
  Administered 2015-11-03: 50 ug via ORAL
  Filled 2015-11-02: qty 1

## 2015-11-02 MED ORDER — TRAMADOL HCL 50 MG PO TABS
50.0000 mg | ORAL_TABLET | Freq: Four times a day (QID) | ORAL | Status: DC | PRN
  Administered 2015-11-03 – 2015-11-06 (×3): 50 mg via ORAL
  Filled 2015-11-02 (×3): qty 1

## 2015-11-02 NOTE — Progress Notes (Signed)
   Subjective:    Patient ID: Kristine Simmons, female    DOB: 01-06-1927, 81 y.o.   MRN: [id number]  HPI  She is here in follow-up from the emergency room visit 10/30/15. She was found to have a T12 compression fracture sustained in previous fall for which she was seen 10/24/15. CT of abdomen and pelvis 11/7 revealed associated canal narrowing due to retropulsion. This had not been seen on prior imaging 05/30/15. Neurosurgical referral was made.  Additionally she was found to have bilateral pleural effusions and atelectasis on the CT.  She was fitted with a TLSO brace. This did result in significant decrease in her pain.   CBC and chem profile were normal except for reduced chloride of 97.   When originally seen in the emergency room 11/1 the films were of the lumbosacral spine and this acute fracture was missed.  For rate control she has been on diltiazem. She is also on digoxin. On June 3 her digoxin level was 1.7. Her last TSH was 2.07 on 09/25/15.   Review of Systems Since the ER she has exhibited progressive failure to thrive. She has not bathed since 10/30/15. She's been unable to employ the brace.  She's had increasing shortness of breath since discharge from the emergency room.  She has not used her rescue inhaler. She also has not been using the maintenance agent as she thought this was not necessary if oxygen were being employed. She has been on Percocet every 8 hours since discharge.     Objective:   Physical Exam Pertinent or positive findings include: When she came in she was profoundly hypoxemic; her oxygen tank had run out. Oxygen saturation was 59%. O2 sat climbed as high as 90% on 15 L, non-rebreather after 15 minutes. The last O2 sat was 82% on 6 L. She was found to have profound variation in heart rate with pulse rate as low as 40s. She is profoundly lethargic. Speech is somewhat slurred. She appears profoundly debilitated. She has asymmetric ptosis, greater on the left.  Breath sounds are decreased and distant. She has a loud irregular rhythm which is slow. Pedal pulses are decreased. She is wearing a thoracic brace .  Eyes: No conjunctival inflammation or scleral icterus is present. Lungs:No increased work of breathing despite profound hypoxemia.   Abdomen: bowel sounds normal, soft and non-tender without masses, organomegaly or hernias noted.  No guarding or rebound.  Vascular : all pulses equal ; no bruits present. Skin:Warm & dry.  Intact without suspicious lesions or rashes ; no tenting or jaundice  Lymphatic: No lymphadenopathy is noted about the head, neck, axilla. Neuro: Strength, tone profoundly decreased.    Assessment & Plan:  #1 profound hypoxemia in the setting of advanced COPD with pleural effusions and atelectasis on ER films 11/7.  #2 atrial fibrillation with profound slowing. EKG compared to 04/09/15 reveals rates of 42-60. She has diffuse biphasic to inverted ST-T changes. These are most significant inferolaterally.  #3 failure to thrive, multifactorial  Plan: She will be sent to the hospital by nonemergency ambulance with a nonrebreather mask. She should be admitted to [facility] to the cardiac unit. Digitalis level should be done to rule out digitalis toxicity. BNP is indicated to rule out superimposed congestive heart failure.

## 2015-11-02 NOTE — ED Provider Notes (Signed)
CSN: [id number]     Arrival date & time 11/02/15  1522 History   First MD Initiated Contact with Patient 11/02/15 1530     Chief Complaint  Patient presents with  . Shortness of Breath     (Consider location/radiation/quality/duration/timing/severity/associated sxs/prior Treatment) HPI Patient presents with concern of weakness, dyspnea. As noted, the patient was at her primary care physician's office today, to arrange physical therapy. While at the office, she became increasingly dyspneic, when she ran out of her oxygen. Patient has been on home oxygen for about 2 weeks. Typically she uses nasal cannula, 24/7. Patient is unsure of the amount of oxygen. Patient acknowledges a history of atrial fibrillation, denies intrinsic pulmonary disease. She currently denies chest pain, syncope, acknowledges lightheadedness, in addition to her weakness, dyspnea. No new asymmetric weakness anywhere, no fever, chills. No cough. Patient was provided oxygen by EMS providers. The stated that the patient's oxygen status improved with nasal cannula.  Past Medical History  Diagnosis Date  . Hx of colonic polyps     last colonoscopy 2005, repeat was due 2010 Dr. Sharlett Iles  . Atrial fibrillation ([place])     Dr Percival Spanish  . Fracture 1995, 2009    RUE; LUE Dr. Durward Fortes  . 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 Cystitis   . Complication of anesthesia     difficulty with arousal post op  . Hyperlipidemia   . Hyperlipidemia   . Osteoporosis   . Cerebrovascular accident ([place]) 01/09  . Hypothyroidism   . Dysrhythmia    Past Surgical History  Procedure Laterality Date  . Tonsillectomy and adenoidectomy    . Vocal cord polyps    . Thyroid needle biopsy  2002  . Appendectomy    . Cataract extraction      Bilat  . Colonoscopy w/ polypectomy  2005    Diverticulosis;Dr. Sharlett Iles  . Total hip arthroplasty  2008    CHF & RAF post op   . Total shoulder replacement  2010    Dr  Durward Fortes - left shoulder  . Mohs surgery  2013    nasal Basal Cell; Dr Sarajane Jews  . Excision metacarpal mass Right 08/17/2013    Procedure: RIGHT LONG EXCISION MASS AND DIP JOINT DEBRIDEMENT;  Surgeon: Tennis Must, MD;  Location: [facility];  Service: Orthopedics;  Laterality: Right;  . Femoral hernia repair Right 08/17/2014    Procedure: OPEN REPAIR RIGHT FEMORAL HERNIA  WITH INSERTION OF MESH;  Surgeon: Adin Hector, MD;  Location: [facility];  Service: General;  Laterality: Right;  . Insertion of mesh  08/17/2014    Procedure: INSERTION OF MESH;  Surgeon: Adin Hector, MD;  Location: [facility];  Service: General;;  . Inguinal hernia repair Right 11/15/2014    Procedure: LAPAROSCOPIC RIGHT INGUINAL HERNIA WITH MESH;  Surgeon: Alphonsa Overall, MD;  Location: [facility];  Service: General;  Laterality: Right;   Family History  Problem Relation Age of Onset  . Stroke Father     > 108  . Diabetes Father   . Leukemia Mother   . Cancer Mother     leukemia  . Colon cancer Brother     Valve replacement  . Heart attack Brother   . Breast cancer Sister   . Ovarian cancer Sister   . Lung cancer Sister     smoker  . Prostate cancer Brother   . Cancer Brother     bladder  Social History  Substance Use Topics  . Smoking status: Former Smoker -- 1.00 packs/day for 45 years    Types: Cigarettes    Quit date: 12/23/1993  . Smokeless tobacco: Never Used     Comment: Smoked 1950-1995, up to 1 ppd  . Alcohol Use: No   OB History    No data available     Review of Systems  Constitutional:       Per HPI, otherwise negative  HENT:       Per HPI, otherwise negative  Respiratory:       Per HPI, otherwise negative  Cardiovascular:       Per HPI, otherwise negative  Gastrointestinal: Negative for vomiting.  Endocrine:       Negative aside from HPI  Genitourinary:       Neg aside from HPI   Musculoskeletal:       Per HPI, otherwise negative  Skin: Negative.   Neurological:  Positive for weakness. Negative for syncope.      Allergies  Hydrocodone; Desmopressin acetate; Dorzolamide hcl; Prempro; and Ciprofloxacin  Home Medications   Prior to Admission medications   Medication Sig Start Date End Date Taking? Authorizing Provider  acetaminophen (TYLENOL) 325 MG tablet You can take 2 tablets every 4 hours as needed.  You cannot take more than 4000 mg of tylenol per day.  This is also in your sleep aide, and it is in your prescribed pain medicine. So you need to figure out.  Do not take more than 12 tablets of tylenol (acetaminophen) per day. 08/19/14  Yes Earnstine Regal, PA-C  albuterol (VENTOLIN HFA) 108 (90 BASE) MCG/ACT inhaler Inhale 2 puffs into the lungs every 6 (six) hours as needed for wheezing or shortness of breath.   Yes Historical Provider, MD  apraclonidine (IOPIDINE) 0.5 % ophthalmic solution Place 1 drop into the right eye every 12 (twelve) hours.   Yes Historical Provider, MD  budesonide-formoterol (SYMBICORT) 80-4.5 MCG/ACT inhaler Inhale 2 puffs into the lungs 2 (two) times daily. Patient taking differently: Inhale 2 puffs into the lungs 2 (two) times daily as needed (shortness of breath).  10/09/15  Yes Juanito Doom, MD  calcium carbonate (TUMS - DOSED IN MG ELEMENTAL CALCIUM) 500 MG chewable tablet Chew 2 tablets by mouth daily as needed for indigestion or heartburn (indigestion).   Yes Historical Provider, MD  Cholecalciferol (VITAMIN D) 2000 UNITS tablet Take 2,000 Units by mouth daily.   Yes Historical Provider, MD  Cranberry (CRANBERRY CONCENTRATE) 500 MG CAPS Take 500 mg by mouth 2 (two) times daily.    Yes Historical Provider, MD  DIGOX 125 MCG tablet TAKE 1 TABLET ONCE DAILY. Patient taking differently: TAKE 125 MCG BY MOUTH DAILY 05/26/15  Yes Minus Breeding, MD  diltiazem (CARDIZEM CD) 120 MG 24 hr capsule TAKE (1) CAPSULE DAILY. Patient taking differently: Take 120 mg by mouth daily.  07/04/15  Yes Minus Breeding, MD  ELIQUIS 2.5 MG  TABS tablet TAKE 1 TABLET TWICE DAILY. Patient taking differently: TAKE 2.5 MG BY MOUTH TWICE DAILY 08/07/15  Yes Minus Breeding, MD  ENSURE PLUS (ENSURE PLUS) LIQD Take 1 Can by mouth daily. 12/20/14  Yes Hendricks Limes, MD  estradiol (ESTRACE) 0.1 MG/GM vaginal cream Place 2 g vaginally 2 (two) times a week. On Wednesdays & Saturdays.   Yes Historical Provider, MD  levothyroxine (SYNTHROID, LEVOTHROID) 50 MCG tablet TAKE 1 TABLET ONCE DAILY. Patient taking differently: Take 50 mcg daily except take 75 mcg on  Wednesday 05/25/15  Yes Hendricks Limes, MD  LUMIGAN 0.01 % SOLN Place 1 drop into both eyes at bedtime. 04/06/15  Yes Historical Provider, MD  Magnesium Oxide 500 MG TABS Take 500 mg by mouth at bedtime.    Yes Historical Provider, MD  oxyCODONE-acetaminophen (PERCOCET/ROXICET) 5-325 MG tablet Take 1 tablet by mouth every 8 (eight) hours as needed for moderate pain or severe pain. 10/30/15  Yes Forde Dandy, MD  OXYGEN Inhale into the lungs continuous.   Yes Historical Provider, MD  polyethylene glycol powder (GLYCOLAX/MIRALAX) powder Take 17 g by mouth daily. Mix 1 capful of powder and dissolve it in any liquid. Drink once daily. 10/30/15  Yes Forde Dandy, MD  Probiotic Product (MISC INTESTINAL FLORA REGULAT) CAPS Take 1 capsule by mouth daily.     Yes Historical Provider, MD  Propylene Glycol (SYSTANE BALANCE OP) Place 1 drop into both eyes as needed (dry and pain).   Yes Historical Provider, MD  RANITIDINE HCL PO Take 1 tablet by mouth daily as needed (heartburn).   Yes Historical Provider, MD  sertraline (ZOLOFT) 25 MG tablet Take 1 tablet (25 mg total) by mouth daily. 04/19/15  Yes Hendricks Limes, MD  sodium chloride (MURO 128) 5 % ophthalmic solution Place 1 drop into the left eye 3 (three) times daily.   Yes Historical Provider, MD  timolol (TIMOPTIC) 0.5 % ophthalmic solution Place 1 drop into both eyes 2 (two) times daily.    Yes Historical Provider, MD  traMADol (ULTRAM) 50 MG tablet  Take 1 tablet (50 mg total) by mouth every 6 (six) hours as needed for moderate pain. 10/27/15  Yes Hendricks Limes, MD  trimethoprim (TRIMPEX) 100 MG tablet Take 100 mg by mouth every other day.    Yes Historical Provider, MD  VYTORIN 10-40 MG per tablet TAKE 1 TABLET ONCE DAILY. 05/15/15  Yes Hendricks Limes, MD  gabapentin (NEURONTIN) 100 MG capsule One pill @ bedtime as needed for back pain Patient not taking: Reported on 11/02/2015 09/06/15   Hendricks Limes, MD  LORazepam (ATIVAN) 1 MG tablet TAKE 1 TABLET EVERY 8-12 HOURS AS NEEDED. CAN BE HABIT FORMING & MAY AFFECT MENTAL ALERTNESS. Patient not taking: Reported on 11/02/2015 11/23/14   Biagio Borg, MD   BP 117/55 mmHg  Pulse 56  Temp(Src) 98.4 F (36.9 C) (Oral)  Resp 15  SpO2 95% Physical Exam  Constitutional: She is oriented to person, place, and time. She has a sickly appearance.    HENT:  Head: Normocephalic and atraumatic.  Eyes: Conjunctivae and EOM are normal.  Cardiovascular: Normal rate.  An irregularly irregular rhythm present.  Pulmonary/Chest: No stridor. She has decreased breath sounds.  Abdominal: She exhibits no distension.  Musculoskeletal: She exhibits no edema.  Neurological: She is alert and oriented to person, place, and time. No cranial nerve deficit.  Skin: Skin is warm and dry.  Psychiatric: She has a normal mood and affect.  Nursing note and vitals reviewed.   ED Course  Procedures (including critical care time)   Chart review conducted, notable for evaluation today, and multiple evaluations within the past weeks, with concern for fall, atelectasis, fatigue.     Labs Review Labs Reviewed  COMPREHENSIVE METABOLIC PANEL - Abnormal; Notable for the following:    Sodium 134 (*)    Potassium 5.6 (*)    Chloride 94 (*)    Glucose, Bld 134 (*)    BUN 31 (*)  Creatinine, Ser 1.13 (*)    Total Protein 6.1 (*)    Albumin 3.4 (*)    AST 292 (*)    ALT 319 (*)    GFR calc non Af Amer 43 (*)      GFR calc Af Amer 50 (*)    All other components within normal limits  TROPONIN I - Abnormal; Notable for the following:    Troponin I 0.04 (*)    All other components within normal limits  BRAIN NATRIURETIC PEPTIDE - Abnormal; Notable for the following:    B Natriuretic Peptide 1237.6 (*)    All other components within normal limits  CBC WITH DIFFERENTIAL/PLATELET  DIGOXIN LEVEL  ACETAMINOPHEN LEVEL    Imaging Review Dg Chest 2 View  11/02/2015  CLINICAL DATA:  Recent back injury, shortness of breath. EXAM: CHEST  2 VIEW COMPARISON:  October 10, 2015 FINDINGS: The heart size and mediastinal contours are stable. The heart size is enlarged. There are small bilateral pleural effusions with compression atelectasis of bilateral lung bases. There is no focal pneumonia. Left shoulder replacement is identified unchanged. IMPRESSION: Small bilateral pleural effusions with adjacent compression atelectasis of the lung bases. Electronically Signed   By: Abelardo Diesel M.D.   On: 11/02/2015 16:48   I have personally reviewed and evaluated these images and lab results as part of my medical decision-making.   EKG Interpretation   Date/Time:  Thursday November 02 2015 15:34:49 EST Ventricular Rate:  58 PR Interval:    QRS Duration: 74 QT Interval:  397 QTC Calculation: 390 R Axis:   91 Text Interpretation:  Atrial fibrillation Probable anterior infarct, age  indeterminate Atrial fibrillation Artifact Abnormal ekg Confirmed by  Carmin Muskrat  MD [phone]) on 11/02/2015 4:05:32 PM     Patient notably hypoxic on room air, saturation 59%. This improves to 83% with nasal cannula, 93% with nonrebreather mask.  Update: Patient remains fatigued, though no new complaints. I discussed initial findings with her and multiple family members. Patient seemingly is unaware of CHF.  Update: Patient is received Lasix, continues to receive oxygen, now via nasal cannula, with decent saturation.   MDM   Old female presents with fatigue, dyspnea. Patient has multiple medical issues, including COPD, aortic stenosis, hypertension. Here the patient has multiple abnormalities, contributing to likely multifactorial respiratory distress. Patient improved w O2- IVF, lasix.  No ecg changes concerning for hyperkalemia effects. Respiratory status improved following a period of increased O2 support. Hyperkalemia addressed w lasix, IVF. Elevated BNP addressed w Lasix. LFT's increased, though no abd pain, no e/o peritonitis.  With initial hypoxemia, respiratory distress, as well as elevated BNP, hyperkalemia, elevated LFT, and slight elevation in troponin, the patient was placed in the stepdown unit for further evaluation and management.   CRITICAL CARE Performed by: Carmin Muskrat Total critical care time: 35 minutes Critical care time was exclusive of separately billable procedures and treating other patients. Critical care was necessary to treat or prevent imminent or life-threatening deterioration. Critical care was time spent personally by me on the following activities: development of treatment plan with patient and/or surrogate as well as nursing, discussions with consultants, evaluation of patient's response to treatment, examination of patient, obtaining history from patient or surrogate, ordering and performing treatments and interventions, ordering and review of laboratory studies, ordering and review of radiographic studies, pulse oximetry and re-evaluation of patient's condition.    Carmin Muskrat, MD 11/02/15 [phone]

## 2015-11-02 NOTE — Progress Notes (Signed)
ANTIBIOTIC CONSULT NOTE - INITIAL  Pharmacy Consult for Unasyn  Indication: Aspiration PNA  Allergies  Allergen Reactions  . Hydrocodone Nausea And Vomiting  . Desmopressin Acetate     REACTION: swelling feet  . Dorzolamide Hcl Other (See Comments)    Burning in eyes and redness  . Prempro [Conj Estrog-Medroxyprogest Ace]     unknown  . Ciprofloxacin     REACTION: vaginal infection    Patient Measurements: Height: 5\' 7"  (170.2 cm) Weight: 131 lb 9.8 oz (59.7 kg) IBW/kg (Calculated) : 61.6  Vital Signs: Temp: 97.5 F (36.4 C) (11/10 2210) Temp Source: Oral (11/10 2210) BP: 94/73 mmHg (11/10 2215) Pulse Rate: 65 (11/10 2215)  Labs:  Recent Labs  11/02/15 1617  WBC 9.4  HGB 13.3  PLT 199  CREATININE 1.13*   Estimated Creatinine Clearance: 33.7 mL/min (by C-G formula based on Cr of 1.13).  Medical History: Past Medical History  Diagnosis Date  . Hx of colonic polyps     last colonoscopy 2005, repeat was due 2010 Dr. Sharlett Iles  . Atrial fibrillation ([place])     Dr Percival Spanish  . Fracture 1995, 2009    RUE; LUE Dr. Durward Fortes  . 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 Cystitis   . Complication of anesthesia     difficulty with arousal post op  . Hyperlipidemia   . Hyperlipidemia   . Osteoporosis   . Cerebrovascular accident ([place]) 01/09  . Hypothyroidism   . Dysrhythmia     Assessment: 81 y/o F with shortness of breath to start Unasyn for possible aspiration PNA, WBC WNL, renal function ok, other labs/meds reviewed.   Plan:  -Unasyn 3g IV q8h -Trend WBC, temp, renal function -F/U infectious work-up  Narda Bonds 11/02/2015,11:11 PM

## 2015-11-02 NOTE — Consult Note (Signed)
PULMONARY / CRITICAL CARE MEDICINE Consult Note   Name: Kristine Simmons MRN: [id number] DOB: 1927-10-02    ADMISSION DATE:  11/02/2015 CONSULTATION DATE:  11/02/15  REFERRING MD :  [facility], Dr. Hal Hope  CHIEF COMPLAINT:  Hypoxia   INITIAL PRESENTATION: Kristine Simmons is a 81 y/o with a h/o emphysema presenting from her PCP's office when her oxygen tank ran out and she was noted to desat to 59% and improved to 90% on a non-rebreather. She was admitted to [facility] and PCCM was consulted due to hypoxia.   STUDIES:  X-ray thoracic spine 11/7 > T12 compression fracture associated with canal narrowing due to retropulsion.  CXR 11/10 > Small bilateral pleural effusions with adjacent compression atelectasis of the lung bases.  SIGNIFICANT EVENTS: 11/10: PCP appt for f/u on T12 fracture, ran out of O2 and noted to be hypoxic to 59%. Diuresed with some improvement.    HISTORY OF PRESENT ILLNESS:  Kristine Simmons is a 81 y/o with a h/o atrial fibrillation and emphysema (continue 4L [place] at home) with a recent T12 compression fracture who presented to the [facility] ED from her PCP's office due to significant hypoxia.   The patient was at her PCP's for an ED follow up appointment after sustaining a T12 compression fracture due to fall (occured on 11/1, diagnosed on 11/7). At that time, her oxygen tank ran out and she became hypoxic to 59% on RA, was placed on a non-rebreather, and transferred to the [facility] ED.  In the ED, the patient was noted to have O2 sat of 59% on RA with improvement to 93% on non-rebreather. Her BNP was elevated to 1237. She received a dose of Lasix 71m IV with improvement to 90% on 4L [place]. The patient was admitted to the SDU by [facility] for further management however was noted to desat to the 80s, therefore she was placed on the non-rebreather again and PCCM was consulted.   The patient states she's been on continuous O2 for approximately 2 weeks. Denies cough, fevers, or dyspnea. Has pharyngeal phase  dysphagia that is stable over the last few years but notes she intermittently feels like she's chocking with eating. Her only complaint is the pain in her back from the fracture.  She is currently in a TLSO brace for her T12 fracture.   PAST MEDICAL HISTORY :   has a past medical history of colonic polyps; Atrial fibrillation ([place]; Fracture (1995, 2009); Blindness; Cystitis; Complication of anesthesia; Hyperlipidemia; Hyperlipidemia; Osteoporosis; Cerebrovascular accident ([facility] (01/09); Hypothyroidism; and Dysrhythmia.  has past surgical history that includes Tonsillectomy and adenoidectomy; Vocal Cord Polyps; Thyroid Needle Biopsy (2002); Appendectomy; Cataract extraction; Colonoscopy w/ polypectomy (2005); Total hip arthroplasty (2008); Total shoulder replacement (2010); Mohs surgery (2013); Excision metacarpal mass (Right, 08/17/2013); Femoral hernia repair (Right, 08/17/2014); Insertion of mesh (08/17/2014); and Inguinal hernia repair (Right, 11/15/2014). Prior to Admission medications   Medication Sig Start Date End Date Taking? Authorizing Provider  acetaminophen (TYLENOL) 325 MG tablet You can take 2 tablets every 4 hours as needed.  You cannot take more than 4000 mg of tylenol per day.  This is also in your sleep aide, and it is in your prescribed pain medicine. So you need to figure out.  Do not take more than 12 tablets of tylenol (acetaminophen) per day. 08/19/14  Yes WEarnstine Regal PA-C  albuterol (VENTOLIN HFA) 108 (90 BASE) MCG/ACT inhaler Inhale 2 puffs into the lungs every 6 (six) hours as needed for wheezing or shortness of breath.  Yes Historical Provider, MD  apraclonidine (IOPIDINE) 0.5 % ophthalmic solution Place 1 drop into the right eye every 12 (twelve) hours.   Yes Historical Provider, MD  budesonide-formoterol (SYMBICORT) 80-4.5 MCG/ACT inhaler Inhale 2 puffs into the lungs 2 (two) times daily. Patient taking differently: Inhale 2 puffs into the lungs 2 (two) times daily as  needed (shortness of breath).  10/09/15  Yes Juanito Doom, MD  calcium carbonate (TUMS - DOSED IN MG ELEMENTAL CALCIUM) 500 MG chewable tablet Chew 2 tablets by mouth daily as needed for indigestion or heartburn (indigestion).   Yes Historical Provider, MD  Cholecalciferol (VITAMIN D) 2000 UNITS tablet Take 2,000 Units by mouth daily.   Yes Historical Provider, MD  Cranberry (CRANBERRY CONCENTRATE) 500 MG CAPS Take 500 mg by mouth 2 (two) times daily.    Yes Historical Provider, MD  DIGOX 125 MCG tablet TAKE 1 TABLET ONCE DAILY. Patient taking differently: TAKE 125 MCG BY MOUTH DAILY 05/26/15  Yes Minus Breeding, MD  diltiazem (CARDIZEM CD) 120 MG 24 hr capsule TAKE (1) CAPSULE DAILY. Patient taking differently: Take 120 mg by mouth daily.  07/04/15  Yes Minus Breeding, MD  ELIQUIS 2.5 MG TABS tablet TAKE 1 TABLET TWICE DAILY. Patient taking differently: TAKE 2.5 MG BY MOUTH TWICE DAILY 08/07/15  Yes Minus Breeding, MD  ENSURE PLUS (ENSURE PLUS) LIQD Take 1 Can by mouth daily. 12/20/14  Yes Hendricks Limes, MD  estradiol (ESTRACE) 0.1 MG/GM vaginal cream Place 2 g vaginally 2 (two) times a week. On Wednesdays & Saturdays.   Yes Historical Provider, MD  levothyroxine (SYNTHROID, LEVOTHROID) 50 MCG tablet TAKE 1 TABLET ONCE DAILY. Patient taking differently: Take 50 mcg daily except take 75 mcg on Wednesday 05/25/15  Yes Hendricks Limes, MD  LUMIGAN 0.01 % SOLN Place 1 drop into both eyes at bedtime. 04/06/15  Yes Historical Provider, MD  Magnesium Oxide 500 MG TABS Take 500 mg by mouth at bedtime.    Yes Historical Provider, MD  oxyCODONE-acetaminophen (PERCOCET/ROXICET) 5-325 MG tablet Take 1 tablet by mouth every 8 (eight) hours as needed for moderate pain or severe pain. 10/30/15  Yes Forde Dandy, MD  OXYGEN Inhale into the lungs continuous.   Yes Historical Provider, MD  polyethylene glycol powder (GLYCOLAX/MIRALAX) powder Take 17 g by mouth daily. Mix 1 capful of powder and dissolve it in any  liquid. Drink once daily. 10/30/15  Yes Forde Dandy, MD  Probiotic Product (MISC INTESTINAL FLORA REGULAT) CAPS Take 1 capsule by mouth daily.     Yes Historical Provider, MD  Propylene Glycol (SYSTANE BALANCE OP) Place 1 drop into both eyes as needed (dry and pain).   Yes Historical Provider, MD  RANITIDINE HCL PO Take 1 tablet by mouth daily as needed (heartburn).   Yes Historical Provider, MD  sertraline (ZOLOFT) 25 MG tablet Take 1 tablet (25 mg total) by mouth daily. 04/19/15  Yes Hendricks Limes, MD  sodium chloride (MURO 128) 5 % ophthalmic solution Place 1 drop into the left eye 3 (three) times daily.   Yes Historical Provider, MD  timolol (TIMOPTIC) 0.5 % ophthalmic solution Place 1 drop into both eyes 2 (two) times daily.    Yes Historical Provider, MD  traMADol (ULTRAM) 50 MG tablet Take 1 tablet (50 mg total) by mouth every 6 (six) hours as needed for moderate pain. 10/27/15  Yes Hendricks Limes, MD  trimethoprim (TRIMPEX) 100 MG tablet Take 100 mg by mouth every other day.  Yes Historical Provider, MD  VYTORIN 10-40 MG per tablet TAKE 1 TABLET ONCE DAILY. 05/15/15  Yes Hendricks Limes, MD  gabapentin (NEURONTIN) 100 MG capsule One pill @ bedtime as needed for back pain Patient not taking: Reported on 11/02/2015 09/06/15   Hendricks Limes, MD  LORazepam (ATIVAN) 1 MG tablet TAKE 1 TABLET EVERY 8-12 HOURS AS NEEDED. CAN BE HABIT FORMING & MAY AFFECT MENTAL ALERTNESS. Patient not taking: Reported on 11/02/2015 11/23/14   Biagio Borg, MD   Allergies  Allergen Reactions  . Hydrocodone Nausea And Vomiting  . Desmopressin Acetate     REACTION: swelling feet  . Dorzolamide Hcl Other (See Comments)    Burning in eyes and redness  . Prempro [Conj Estrog-Medroxyprogest Ace]     unknown  . Ciprofloxacin     REACTION: vaginal infection    FAMILY HISTORY:  has no family status information on file.  SOCIAL HISTORY:  reports that she quit smoking about 21 years ago. Her smoking use  included Cigarettes. She has a 45 pack-year smoking history. She has never used smokeless tobacco. She reports that she does not drink alcohol or use illicit drugs.   Review of Systems  Constitutional: Negative for fever, chills and malaise/fatigue.  HENT: Negative for congestion and sore throat.   Eyes: Negative for photophobia.  Respiratory: Negative for cough, hemoptysis, sputum production, shortness of breath and wheezing.   Cardiovascular: Negative for chest pain.  Gastrointestinal: Negative for vomiting and abdominal pain.  Genitourinary: Negative for dysuria.  Musculoskeletal: Positive for back pain.  Skin: Negative for rash.  Neurological: Negative for focal weakness and weakness.  Psychiatric/Behavioral: The patient is not nervous/anxious.     SUBJECTIVE: No dyspnea. No chest pain.   VITAL SIGNS: Temp:  [97.5 F (36.4 C)-98.4 F (36.9 C)] 97.5 F (36.4 C) (11/10 2210) Pulse Rate:  [44-118] 65 (11/10 2215) Resp:  [11-24] 15 (11/10 2215) BP: (94-134)/(49-73) 94/73 mmHg (11/10 2215) SpO2:  [59 %-99 %] 99 % (11/10 2215) FiO2 (%):  [55 %] 55 % (11/10 1556) Weight:  [131 lb 9.8 oz (59.7 kg)-135 lb (61.236 kg)] 131 lb 9.8 oz (59.7 kg) (11/10 2210) HEMODYNAMICS:   VENTILATOR SETTINGS: Vent Mode:  [-]  FiO2 (%):  [55 %] 55 % INTAKE / OUTPUT:  Intake/Output Summary (Last 24 hours) at 11/02/15 2358 Last data filed at 11/02/15 1900  Gross per 24 hour  Intake    225 ml  Output      0 ml  Net    225 ml    PHYSICAL EXAMINATION: General:  Sitting up in bed in NAD. Intermittently talking off 50% mask Neuro:  Alert and oriented. No gross neurologic deficits. HEENT: Conjunctivae non-injected. No nasal drainage. Cardiovascular:  Irregularly irregular. No m/r/g noted. +JVD. Trace pitting edema Lungs:  Occasional crackle noted over the anterior chest wall however difficult exam due to brace. During most of the exam satting 90-100% without supplemental O2, placed non-rebreather  back on and continued to sat 94-96%.  Abdomen: +BS. Non-distended. Non-tender.  Musculoskeletal:  Normal bulk and tone.  Skin:  No rashes noted.   LABS:  CBC  Recent Labs Lab 10/30/15 1327 11/02/15 1617  WBC 7.3 9.4  HGB 14.7 13.3  HCT 45.2 42.6  PLT 186 199   Coag's No results for input(s): APTT, INR in the last 168 hours. BMET  Recent Labs Lab 10/30/15 1327 11/02/15 1617  NA 138 134*  K 4.4 5.6*  CL 97* 94*  CO2  32 32  BUN 16 31*  CREATININE 0.81 1.13*  GLUCOSE 98 134*   Electrolytes  Recent Labs Lab 10/30/15 1327 11/02/15 1617  CALCIUM 9.5 9.3   Sepsis Markers No results for input(s): LATICACIDVEN, PROCALCITON, O2SATVEN in the last 168 hours. ABG  Recent Labs Lab 11/02/15 2335  PHART 7.440  PCO2ART 46.1*  PO2ART 55.2*   Liver Enzymes  Recent Labs Lab 11/02/15 1617  AST 292*  ALT 319*  ALKPHOS 61  BILITOT 1.2  ALBUMIN 3.4*   Cardiac Enzymes  Recent Labs Lab 11/02/15 1617  TROPONINI 0.04*   Glucose No results for input(s): GLUCAP in the last 168 hours.  Imaging Dg Chest 2 View  11/02/2015  CLINICAL DATA:  Recent back injury, shortness of breath. EXAM: CHEST  2 VIEW COMPARISON:  October 10, 2015 FINDINGS: The heart size and mediastinal contours are stable. The heart size is enlarged. There are small bilateral pleural effusions with compression atelectasis of bilateral lung bases. There is no focal pneumonia. Left shoulder replacement is identified unchanged. IMPRESSION: Small bilateral pleural effusions with adjacent compression atelectasis of the lung bases. Electronically Signed   By: Abelardo Diesel M.D.   On: 11/02/2015 16:48     ASSESSMENT / PLAN:  Acute on chronic hypoxic respiratory failure - likely multifactorial in the setting of lack of supplemental O2 after running out, known COPD with severe emphysema, pleural effusions, volume overload with possible underlying CHF, and possible aspiration (she also has hx of aspiration  pneumonitis). She did reportedly have good response to 72m lasix. Doubt PE as pt reports she never actually felt dyspneic or had any pain, she was just informed of low SpO2 at PCP office. COPD without evidence of exacerbation - followed by Dr. MLake Bells(PFT's from Jan 2016: ratio 58% FEV1 1.83L (88% pred, no change with BD), TLC 5.78L (103% pred), DLCO 9.01 (31% pred)).  Plan: Continue supplemental O2 as needed to maintain SpO2 88 - 90%. Budesonide / Brovana in lieu of outpatient symbicort. Albuterol PRN. Continue empiric unasyn. PCT algorithm - if low, then consider early d/c abx.  Concern for underlying CHF - slightly volume overloaded on exam and BNP of 1237. She did receive 418mlasix in ED and reportedly her dyspnea improved. Last echo from June 2015 with EF 60-65%. Troponin leak - suspect demand ischemia. A.fib - rate controlled. Plan: Assess echo given last one was in 2015. Gentle diuresis as BP and SCr permit. Trend troponins. Continue eliquis, diltiazem, digoxin.  Hyponatremia - likely hypervolemic. Mild hyperkalemia. AKI. Plan: Check serum osmoles. Kayexalate 15g x 1. BMP in AM.  Recent T12 compression fx with associated cana narrowing due to retropulsion. Plan: Continue TLSO brace. Low dose fentanyl - avoid oversedation given her hypoxia. Neurosurgical consultation pending.   CrArchie PattenMD, MD PGY-2,  [facility] 11/02/2015 11:58 PM  Attending Note:  [age]ear old female with PMH of COPD, has been on O2 for the past 2 wks after sustaining a T12 compression fracture (not sure how are they related unless taking a lot of narcotics for pain).  Patient ran out of O2 and presented to her PCP's office with sats of 59% that went up to 100% when placed on a NRB.  Lasix was effective in decreasing O2 demand.  The patient and family met with palliative care and is not do not resuscitate.  O2 demand is down to 50%.  I reviewed the CXR myself,  hyperinflation and bilateral pleural effusions.  Discussed with  resident.  I think our goal at this point is optimize quality of life therefore will not offer thoras.  But I do feel that diureses will be effective.  Crackles anteriori (brace), no wheezing on exam.  Hypoxemic respiratory failure:  - Supplemental O2 for sat of 88-92%.  - Will need a home concentrator not tanks only to avoid this situation again.  Pulmonary edema:  - Echo  - Diureses  Pleural effusion:  - No thora.  - Diureses only.  COPD:  - ICS.  - SABA  - LABA.  - If wheezes again would add spiriva.  ?PNA: PCT is very low, would recommend stopping unasyn unless other signs of infection appear.  PCCM will sign off, please call back if needed.  Patient seen and examined, agree with above note.  I dictated the care and orders written for this patient under my direction.  Rush Farmer, MD [phone]

## 2015-11-02 NOTE — H&P (Addendum)
[facility] History and Physical  Kristine Simmons [id number] DOB: 1927-08-25 DOA: 11/02/2015  Referring physician: Dr. Vanita Panda. PCP: Unice Cobble, MD  Specialists: [id number]. Pulmonologist.  Chief Complaint: Hypoxia.  HPI: Kristine Simmons is a 81 y.o. female with history of chronic atrial fibrillation, chronic respiratory failure secondary to COPD and chronic aspiration, hypothyroidism, who has had recent T12 compression fracture and is on brace had followed up with her primary care physician where patient was found to be very weak to walk and also hypoxic. In the ER patient had required initially nonrebreather and chest x-ray shows bilateral pleural effusion with significant elevated BNP levels. Patient denies any chest pain or productive cough but does have history of chronic aspiration. On exam patient does not have any active wheezing. Patient has diuresed at least 800 mL after Lasix 40 mg. Patient will be admitted for acute respiratory failure with acute renal failure.   Review of Systems: As presented in the history of presenting illness, rest negative.  Past Medical History  Diagnosis Date  . Hx of colonic polyps     last colonoscopy 2005, repeat was due 2010 Dr. Sharlett Iles  . Atrial fibrillation ([place])     Dr Percival Spanish  . Fracture 1995, 2009    RUE; LUE Dr. Durward Fortes  . 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 Cystitis   . Complication of anesthesia     difficulty with arousal post op  . Hyperlipidemia   . Hyperlipidemia   . Osteoporosis   . Cerebrovascular accident ([place]) 01/09  . Hypothyroidism   . Dysrhythmia    Past Surgical History  Procedure Laterality Date  . Tonsillectomy and adenoidectomy    . Vocal cord polyps    . Thyroid needle biopsy  2002  . Appendectomy    . Cataract extraction      Bilat  . Colonoscopy w/ polypectomy  2005    Diverticulosis;Dr. Sharlett Iles  . Total hip arthroplasty  2008    CHF & RAF post op   . Total  shoulder replacement  2010    Dr Durward Fortes - left shoulder  . Mohs surgery  2013    nasal Basal Cell; Dr Sarajane Jews  . Excision metacarpal mass Right 08/17/2013    Procedure: RIGHT LONG EXCISION MASS AND DIP JOINT DEBRIDEMENT;  Surgeon: Tennis Must, MD;  Location: [facility];  Service: Orthopedics;  Laterality: Right;  . Femoral hernia repair Right 08/17/2014    Procedure: OPEN REPAIR RIGHT FEMORAL HERNIA  WITH INSERTION OF MESH;  Surgeon: Adin Hector, MD;  Location: [facility];  Service: General;  Laterality: Right;  . Insertion of mesh  08/17/2014    Procedure: INSERTION OF MESH;  Surgeon: Adin Hector, MD;  Location: [facility];  Service: General;;  . Inguinal hernia repair Right 11/15/2014    Procedure: LAPAROSCOPIC RIGHT INGUINAL HERNIA WITH MESH;  Surgeon: Alphonsa Overall, MD;  Location: [facility];  Service: General;  Laterality: Right;   Social History:  reports that she quit smoking about 21 years ago. Her smoking use included Cigarettes. She has a 45 pack-year smoking history. She has never used smokeless tobacco. She reports that she does not drink alcohol or use illicit drugs. Where does patient live home. Can patient participate in ADLs? Yes.  Allergies  Allergen Reactions  . Hydrocodone Nausea And Vomiting  . Desmopressin Acetate     REACTION: swelling feet  . Dorzolamide Hcl Other (See Comments)  Burning in eyes and redness  . Prempro [Conj Estrog-Medroxyprogest Ace]     unknown  . Ciprofloxacin     REACTION: vaginal infection    Family History:  Family History  Problem Relation Age of Onset  . Stroke Father     > 66  . Diabetes Father   . Leukemia Mother   . Cancer Mother     leukemia  . Colon cancer Brother     Valve replacement  . Heart attack Brother   . Breast cancer Sister   . Ovarian cancer Sister   . Lung cancer Sister     smoker  . Prostate cancer Brother   . Cancer Brother     bladder     Prior to Admission medications   Medication  Sig Start Date End Date Taking? Authorizing Provider  acetaminophen (TYLENOL) 325 MG tablet You can take 2 tablets every 4 hours as needed.  You cannot take more than 4000 mg of tylenol per day.  This is also in your sleep aide, and it is in your prescribed pain medicine. So you need to figure out.  Do not take more than 12 tablets of tylenol (acetaminophen) per day. 08/19/14  Yes Earnstine Regal, PA-C  albuterol (VENTOLIN HFA) 108 (90 BASE) MCG/ACT inhaler Inhale 2 puffs into the lungs every 6 (six) hours as needed for wheezing or shortness of breath.   Yes Historical Provider, MD  apraclonidine (IOPIDINE) 0.5 % ophthalmic solution Place 1 drop into the right eye every 12 (twelve) hours.   Yes Historical Provider, MD  budesonide-formoterol (SYMBICORT) 80-4.5 MCG/ACT inhaler Inhale 2 puffs into the lungs 2 (two) times daily. Patient taking differently: Inhale 2 puffs into the lungs 2 (two) times daily as needed (shortness of breath).  10/09/15  Yes Juanito Doom, MD  calcium carbonate (TUMS - DOSED IN MG ELEMENTAL CALCIUM) 500 MG chewable tablet Chew 2 tablets by mouth daily as needed for indigestion or heartburn (indigestion).   Yes Historical Provider, MD  Cholecalciferol (VITAMIN D) 2000 UNITS tablet Take 2,000 Units by mouth daily.   Yes Historical Provider, MD  Cranberry (CRANBERRY CONCENTRATE) 500 MG CAPS Take 500 mg by mouth 2 (two) times daily.    Yes Historical Provider, MD  DIGOX 125 MCG tablet TAKE 1 TABLET ONCE DAILY. Patient taking differently: TAKE 125 MCG BY MOUTH DAILY 05/26/15  Yes Minus Breeding, MD  diltiazem (CARDIZEM CD) 120 MG 24 hr capsule TAKE (1) CAPSULE DAILY. Patient taking differently: Take 120 mg by mouth daily.  07/04/15  Yes Minus Breeding, MD  ELIQUIS 2.5 MG TABS tablet TAKE 1 TABLET TWICE DAILY. Patient taking differently: TAKE 2.5 MG BY MOUTH TWICE DAILY 08/07/15  Yes Minus Breeding, MD  ENSURE PLUS (ENSURE PLUS) LIQD Take 1 Can by mouth daily. 12/20/14  Yes Hendricks Limes, MD  estradiol (ESTRACE) 0.1 MG/GM vaginal cream Place 2 g vaginally 2 (two) times a week. On Wednesdays & Saturdays.   Yes Historical Provider, MD  levothyroxine (SYNTHROID, LEVOTHROID) 50 MCG tablet TAKE 1 TABLET ONCE DAILY. Patient taking differently: Take 50 mcg daily except take 75 mcg on Wednesday 05/25/15  Yes Hendricks Limes, MD  LUMIGAN 0.01 % SOLN Place 1 drop into both eyes at bedtime. 04/06/15  Yes Historical Provider, MD  Magnesium Oxide 500 MG TABS Take 500 mg by mouth at bedtime.    Yes Historical Provider, MD  oxyCODONE-acetaminophen (PERCOCET/ROXICET) 5-325 MG tablet Take 1 tablet by mouth every 8 (eight) hours as  needed for moderate pain or severe pain. 10/30/15  Yes Forde Dandy, MD  OXYGEN Inhale into the lungs continuous.   Yes Historical Provider, MD  polyethylene glycol powder (GLYCOLAX/MIRALAX) powder Take 17 g by mouth daily. Mix 1 capful of powder and dissolve it in any liquid. Drink once daily. 10/30/15  Yes Forde Dandy, MD  Probiotic Product (MISC INTESTINAL FLORA REGULAT) CAPS Take 1 capsule by mouth daily.     Yes Historical Provider, MD  Propylene Glycol (SYSTANE BALANCE OP) Place 1 drop into both eyes as needed (dry and pain).   Yes Historical Provider, MD  RANITIDINE HCL PO Take 1 tablet by mouth daily as needed (heartburn).   Yes Historical Provider, MD  sertraline (ZOLOFT) 25 MG tablet Take 1 tablet (25 mg total) by mouth daily. 04/19/15  Yes Hendricks Limes, MD  sodium chloride (MURO 128) 5 % ophthalmic solution Place 1 drop into the left eye 3 (three) times daily.   Yes Historical Provider, MD  timolol (TIMOPTIC) 0.5 % ophthalmic solution Place 1 drop into both eyes 2 (two) times daily.    Yes Historical Provider, MD  traMADol (ULTRAM) 50 MG tablet Take 1 tablet (50 mg total) by mouth every 6 (six) hours as needed for moderate pain. 10/27/15  Yes Hendricks Limes, MD  trimethoprim (TRIMPEX) 100 MG tablet Take 100 mg by mouth every other day.    Yes Historical  Provider, MD  VYTORIN 10-40 MG per tablet TAKE 1 TABLET ONCE DAILY. 05/15/15  Yes Hendricks Limes, MD  gabapentin (NEURONTIN) 100 MG capsule One pill @ bedtime as needed for back pain Patient not taking: Reported on 11/02/2015 09/06/15   Hendricks Limes, MD  LORazepam (ATIVAN) 1 MG tablet TAKE 1 TABLET EVERY 8-12 HOURS AS NEEDED. CAN BE HABIT FORMING & MAY AFFECT MENTAL ALERTNESS. Patient not taking: Reported on 11/02/2015 11/23/14   Biagio Borg, MD    Physical Exam: Filed Vitals:   11/02/15 2030 11/02/15 2100 11/02/15 2210 11/02/15 2215  BP: 122/62 124/68 134/72 94/73  Pulse: 58 58 72 65  Temp:   97.5 F (36.4 C)   TempSrc:   Oral   Resp: 15 16 11 15   Height:   5\' 7"  (1.702 m)   Weight:   59.7 kg (131 lb 9.8 oz)   SpO2: 92% 90% 92% 99%     General:  Moderately built and nourished.  Eyes: Anicteric no pallor.  ENT: No discharge from the ears eyes nose and mouth.  Neck: JVD mildly elevated no mass felt.  Cardiovascular: S1 and S2 heard.  Respiratory: No rhonchi or crepitations.  Abdomen: Soft nontender bowel sounds present.  Skin: No rash.  Musculoskeletal: No edema.  Psychiatric: Appears normal.  Neurologic: Alert awake oriented to time place and person. Moves all extremities.  Labs on Admission:  Basic Metabolic Panel:  Recent Labs Lab 10/30/15 1327 11/02/15 1617  NA 138 134*  K 4.4 5.6*  CL 97* 94*  CO2 32 32  GLUCOSE 98 134*  BUN 16 31*  CREATININE 0.81 1.13*  CALCIUM 9.5 9.3   Liver Function Tests:  Recent Labs Lab 11/02/15 1617  AST 292*  ALT 319*  ALKPHOS 61  BILITOT 1.2  PROT 6.1*  ALBUMIN 3.4*   No results for input(s): LIPASE, AMYLASE in the last 168 hours. No results for input(s): AMMONIA in the last 168 hours. CBC:  Recent Labs Lab 10/30/15 1327 11/02/15 1617  WBC 7.3 9.4  NEUTROABS 5.2  7.3  HGB 14.7 13.3  HCT 45.2 42.6  MCV 88.5 90.4  PLT 186 199   Cardiac Enzymes:  Recent Labs Lab 11/02/15 1617  TROPONINI 0.04*     BNP (last 3 results)  Recent Labs  04/08/15 2135 11/02/15 1619  BNP 539.9* 1237.6*    ProBNP (last 3 results)  Recent Labs  11/18/14 0341  PROBNP 6363.0*    CBG: No results for input(s): GLUCAP in the last 168 hours.  Radiological Exams on Admission: Dg Chest 2 View  11/02/2015  CLINICAL DATA:  Recent back injury, shortness of breath. EXAM: CHEST  2 VIEW COMPARISON:  October 10, 2015 FINDINGS: The heart size and mediastinal contours are stable. The heart size is enlarged. There are small bilateral pleural effusions with compression atelectasis of bilateral lung bases. There is no focal pneumonia. Left shoulder replacement is identified unchanged. IMPRESSION: Small bilateral pleural effusions with adjacent compression atelectasis of the lung bases. Electronically Signed   By: Abelardo Diesel M.D.   On: 11/02/2015 16:48    EKG: Independently reviewed. Atrial fibrillation rate controlled.  Assessment/Plan Active Problems:   Hypothyroidism   Hyperlipidemia   Acute on chronic respiratory failure with hypoxia (HCC)   Chronic atrial fibrillation (HCC)   T12 compression fracture (HCC)   Acute respiratory failure with hypoxia (HCC)   1. Acute on chronic respiratory failure - probably from CHF and there may be a component of aspiration. Patient has been placed on Lasix 40 mg IV daily for now. Closely follow intake and output metabolic panel. Check 2-D echo. I have ordered a stat ABG. May require C Pap if respiratory status does not improve. For possible aspiration patient has been placed on Unasyn. Check swallow eval. Check d-dimer. 2. COPD - continue inhalers. Does not have any active wheeze at this time. 3. Acute renal failure with mild hyperkalemia - recheck metabolic panel. Patient did receive Lasix. Closely follow intake and output and metabolic panel. Check UA. 4. Chronic atrial fibrillation rate controlled - continue digoxin, Cardizem and Apixaban. Chads 2 vasc score is more  than 2. 5. Elevated LFT'S - patient has known history of elevated LFT'S.But it has increased more than usual.? Cause. Check Acute hepatitis panel. Since patient is mildly drowsy will check Ammonia. Acetaminophen levels negative. Hold statins until LFT'S improve. Abdomen appears benign. 6. Recent T12 compression chads - on brace. PT consult. Avoid narcotics since patient gets drowsy. On tramadol for pain. 7. Hypothyroidism on Synthroid. 8. Hyperlipidemia.  CODE STATUS discussion - patient states she has a DO NOT RESUSCITATE but wants to be full code at this time. I have consulted pulmonary critical care.   DVT Prophylaxis Apixaban.  Code Status: Full code.  Family Communication: Discussed with patient.  Disposition Plan: Admit to inpatient.    Shailey Butterbaugh N. [facility] Pager [phone].  If 7PM-7AM, please contact night-coverage [email or web address] Password [email or web address] 11/02/2015, 11:08 PM

## 2015-11-02 NOTE — ED Notes (Signed)
Earrings given to pt daughter.

## 2015-11-02 NOTE — Patient Instructions (Signed)
To ER via non emergency ambulance

## 2015-11-02 NOTE — ED Notes (Signed)
Family at bedside. 

## 2015-11-02 NOTE — ED Notes (Signed)
The patient is falling asleep and dropping her oxygen levels.  She is still in the low 80's so I contacted respiratory and was advised to put her on a non-re-breather.  MD aware.

## 2015-11-02 NOTE — Progress Notes (Signed)
Pre visit review using our clinic review tool, if applicable. No additional management support is needed unless otherwise documented below in the visit note. 

## 2015-11-02 NOTE — ED Notes (Signed)
Per [facility] patient was at [facility] to see her PCP to follow up on a back injury.  The patient is on constant O2 supp, while in the waiting room her O2 tank ran out and she became short of breath.  En route EMS placed patient on 4L N/C and the patient's oxygen saturations normalized.  The patient is no apparent distress at this time.

## 2015-11-03 ENCOUNTER — Inpatient Hospital Stay (HOSPITAL_COMMUNITY): Payer: Medicare Other

## 2015-11-03 DIAGNOSIS — I4891 Unspecified atrial fibrillation: Secondary | ICD-10-CM

## 2015-11-03 DIAGNOSIS — I5031 Acute diastolic (congestive) heart failure: Secondary | ICD-10-CM

## 2015-11-03 DIAGNOSIS — Z7189 Other specified counseling: Secondary | ICD-10-CM

## 2015-11-03 HISTORY — DX: Acute diastolic (congestive) heart failure: I50.31

## 2015-11-03 LAB — HEPATIC FUNCTION PANEL
ALBUMIN: 3.4 g/dL — AB (ref 3.5–5.0)
ALK PHOS: 54 U/L (ref 38–126)
ALT: 275 U/L — ABNORMAL HIGH (ref 14–54)
ALT: 283 U/L — ABNORMAL HIGH (ref 14–54)
AST: 234 U/L — ABNORMAL HIGH (ref 15–41)
AST: 239 U/L — ABNORMAL HIGH (ref 15–41)
Albumin: 3.4 g/dL — ABNORMAL LOW (ref 3.5–5.0)
Alkaline Phosphatase: 60 U/L (ref 38–126)
BILIRUBIN DIRECT: 0.4 mg/dL (ref 0.1–0.5)
BILIRUBIN DIRECT: 0.4 mg/dL (ref 0.1–0.5)
BILIRUBIN INDIRECT: 0.8 mg/dL (ref 0.3–0.9)
BILIRUBIN TOTAL: 1.2 mg/dL (ref 0.3–1.2)
BILIRUBIN TOTAL: 1.2 mg/dL (ref 0.3–1.2)
Indirect Bilirubin: 0.8 mg/dL (ref 0.3–0.9)
Total Protein: 5.5 g/dL — ABNORMAL LOW (ref 6.5–8.1)
Total Protein: 5.7 g/dL — ABNORMAL LOW (ref 6.5–8.1)

## 2015-11-03 LAB — URINALYSIS, ROUTINE W REFLEX MICROSCOPIC
Bilirubin Urine: NEGATIVE
GLUCOSE, UA: NEGATIVE mg/dL
HGB URINE DIPSTICK: NEGATIVE
KETONES UR: NEGATIVE mg/dL
Nitrite: NEGATIVE
PH: 5.5 (ref 5.0–8.0)
Protein, ur: NEGATIVE mg/dL
Specific Gravity, Urine: 1.015 (ref 1.005–1.030)
Urobilinogen, UA: 1 mg/dL (ref 0.0–1.0)

## 2015-11-03 LAB — CBC
HEMATOCRIT: 39.4 % (ref 36.0–46.0)
Hemoglobin: 13.1 g/dL (ref 12.0–15.0)
MCH: 28.9 pg (ref 26.0–34.0)
MCHC: 33.2 g/dL (ref 30.0–36.0)
MCV: 86.8 fL (ref 78.0–100.0)
PLATELETS: 156 10*3/uL (ref 150–400)
RBC: 4.54 MIL/uL (ref 3.87–5.11)
RDW: 14.3 % (ref 11.5–15.5)
WBC: 9.7 10*3/uL (ref 4.0–10.5)

## 2015-11-03 LAB — BASIC METABOLIC PANEL
Anion gap: 10 (ref 5–15)
Anion gap: 12 (ref 5–15)
BUN: 25 mg/dL — AB (ref 6–20)
BUN: 27 mg/dL — AB (ref 6–20)
CALCIUM: 8.9 mg/dL (ref 8.9–10.3)
CHLORIDE: 95 mmol/L — AB (ref 101–111)
CO2: 31 mmol/L (ref 22–32)
CO2: 34 mmol/L — AB (ref 22–32)
CREATININE: 0.99 mg/dL (ref 0.44–1.00)
CREATININE: 1.08 mg/dL — AB (ref 0.44–1.00)
Calcium: 8.5 mg/dL — ABNORMAL LOW (ref 8.9–10.3)
Chloride: 93 mmol/L — ABNORMAL LOW (ref 101–111)
GFR calc Af Amer: 58 mL/min — ABNORMAL LOW (ref >60)
GFR calc non Af Amer: 45 mL/min — ABNORMAL LOW (ref >60)
GFR calc non Af Amer: 50 mL/min — ABNORMAL LOW (ref >60)
GFR, EST AFRICAN AMERICAN: 52 mL/min — AB (ref >60)
GLUCOSE: 85 mg/dL (ref 65–99)
Glucose, Bld: 93 mg/dL (ref 65–99)
Potassium: 3.7 mmol/L (ref 3.5–5.1)
Potassium: 3.6 mmol/L (ref 3.5–5.1)
SODIUM: 137 mmol/L (ref 135–145)
SODIUM: 138 mmol/L (ref 135–145)

## 2015-11-03 LAB — GLUCOSE, CAPILLARY
GLUCOSE-CAPILLARY: 121 mg/dL — AB (ref 65–99)
GLUCOSE-CAPILLARY: 79 mg/dL (ref 65–99)
Glucose-Capillary: 162 mg/dL — ABNORMAL HIGH (ref 65–99)
Glucose-Capillary: 82 mg/dL (ref 65–99)
Glucose-Capillary: 90 mg/dL (ref 65–99)

## 2015-11-03 LAB — AMMONIA: AMMONIA: 16 umol/L (ref 9–35)

## 2015-11-03 LAB — TROPONIN I
TROPONIN I: 0.03 ng/mL (ref <0.031)
Troponin I: 0.03 ng/mL (ref <0.031)

## 2015-11-03 LAB — PROCALCITONIN: Procalcitonin: <0.1 ng/mL

## 2015-11-03 LAB — URINE MICROSCOPIC-ADD ON

## 2015-11-03 LAB — PROTIME-INR
INR: 1.47 (ref 0.00–1.49)
PROTHROMBIN TIME: 17.9 s — AB (ref 11.6–15.2)

## 2015-11-03 LAB — D-DIMER, QUANTITATIVE (NOT AT ARMC): D DIMER QUANT: 6.56 ug{FEU}/mL — AB (ref 0.00–0.48)

## 2015-11-03 LAB — LACTIC ACID, PLASMA: Lactic Acid, Venous: 1.3 mmol/L (ref 0.5–2.0)

## 2015-11-03 LAB — OSMOLALITY: OSMOLALITY: 289 mosm/kg (ref 275–300)

## 2015-11-03 LAB — MRSA PCR SCREENING: MRSA BY PCR: NEGATIVE

## 2015-11-03 MED ORDER — BUDESONIDE 0.25 MG/2ML IN SUSP
0.5000 mg | Freq: Two times a day (BID) | RESPIRATORY_TRACT | Status: DC
  Administered 2015-11-03: 0.5 mg via RESPIRATORY_TRACT
  Administered 2015-11-03: 0.25 mg via RESPIRATORY_TRACT
  Administered 2015-11-04 – 2015-11-08 (×8): 0.5 mg via RESPIRATORY_TRACT
  Filled 2015-11-03 (×14): qty 4

## 2015-11-03 MED ORDER — FAMOTIDINE 20 MG PO TABS
10.0000 mg | ORAL_TABLET | Freq: Every day | ORAL | Status: DC
  Administered 2015-11-03 – 2015-11-08 (×6): 10 mg via ORAL
  Filled 2015-11-03 (×6): qty 1

## 2015-11-03 MED ORDER — FUROSEMIDE 10 MG/ML IJ SOLN
20.0000 mg | Freq: Four times a day (QID) | INTRAMUSCULAR | Status: DC
Start: 2015-11-03 — End: 2015-11-03

## 2015-11-03 MED ORDER — FUROSEMIDE 10 MG/ML IJ SOLN
40.0000 mg | Freq: Three times a day (TID) | INTRAMUSCULAR | Status: DC
Start: 2015-11-03 — End: 2015-11-06
  Administered 2015-11-03 – 2015-11-06 (×10): 40 mg via INTRAVENOUS
  Filled 2015-11-03 (×10): qty 4

## 2015-11-03 MED ORDER — ARFORMOTEROL TARTRATE 15 MCG/2ML IN NEBU
15.0000 ug | INHALATION_SOLUTION | Freq: Two times a day (BID) | RESPIRATORY_TRACT | Status: DC
  Administered 2015-11-03 – 2015-11-08 (×10): 15 ug via RESPIRATORY_TRACT
  Filled 2015-11-03 (×12): qty 2

## 2015-11-03 MED ORDER — CETYLPYRIDINIUM CHLORIDE 0.05 % MT LIQD
7.0000 mL | Freq: Two times a day (BID) | OROMUCOSAL | Status: DC
Start: 2015-11-03 — End: 2015-11-08
  Administered 2015-11-03 – 2015-11-08 (×9): 7 mL via OROMUCOSAL

## 2015-11-03 MED ORDER — MAGNESIUM OXIDE 400 (241.3 MG) MG PO TABS
400.0000 mg | ORAL_TABLET | Freq: Every day | ORAL | Status: DC
  Administered 2015-11-03 – 2015-11-07 (×5): 400 mg via ORAL
  Filled 2015-11-03 (×5): qty 1

## 2015-11-03 MED ORDER — LEVOTHYROXINE SODIUM 75 MCG PO TABS
75.0000 ug | ORAL_TABLET | ORAL | Status: DC
  Administered 2015-11-08: 75 ug via ORAL
  Filled 2015-11-03: qty 1

## 2015-11-03 MED ORDER — LATANOPROST 0.005 % OP SOLN
1.0000 [drp] | Freq: Every day | OPHTHALMIC | Status: DC
  Administered 2015-11-03 – 2015-11-07 (×5): 1 [drp] via OPHTHALMIC
  Filled 2015-11-03: qty 2.5

## 2015-11-03 MED ORDER — POLYETHYLENE GLYCOL 3350 17 G PO PACK
17.0000 g | PACK | Freq: Every day | ORAL | Status: DC
  Administered 2015-11-03 – 2015-11-07 (×4): 17 g via ORAL
  Filled 2015-11-03 (×6): qty 1

## 2015-11-03 MED ORDER — PROPYLENE GLYCOL 0.6 % OP SOLN: OPHTHALMIC | Status: DC | PRN

## 2015-11-03 MED ORDER — OXYCODONE-ACETAMINOPHEN 5-325 MG PO TABS: 1.0000 | ORAL_TABLET | Freq: Three times a day (TID) | ORAL | Status: DC | PRN

## 2015-11-03 MED ORDER — POLYETHYLENE GLYCOL 3350 17 GM/SCOOP PO POWD
17.0000 g | Freq: Every day | ORAL | Status: DC
  Filled 2015-11-03: qty 255

## 2015-11-03 MED ORDER — FENTANYL CITRATE (PF) 100 MCG/2ML IJ SOLN: 25.0000 ug | INTRAMUSCULAR | Status: DC | PRN

## 2015-11-03 MED ORDER — SODIUM POLYSTYRENE SULFONATE 15 GM/60ML PO SUSP
15.0000 g | Freq: Once | ORAL | Status: AC
  Administered 2015-11-03: 15 g via ORAL
  Filled 2015-11-03: qty 60

## 2015-11-03 MED ORDER — BUDESONIDE-FORMOTEROL FUMARATE 80-4.5 MCG/ACT IN AERO
2.0000 | INHALATION_SPRAY | Freq: Two times a day (BID) | RESPIRATORY_TRACT | Status: DC
  Administered 2015-11-03 – 2015-11-08 (×6): 2 via RESPIRATORY_TRACT
  Filled 2015-11-03 (×2): qty 6.9

## 2015-11-03 MED ORDER — LEVOTHYROXINE SODIUM 50 MCG PO TABS
50.0000 ug | ORAL_TABLET | ORAL | Status: DC
  Administered 2015-11-04 – 2015-11-07 (×4): 50 ug via ORAL
  Filled 2015-11-03 (×5): qty 1

## 2015-11-03 MED ORDER — POLYVINYL ALCOHOL 1.4 % OP SOLN
1.0000 [drp] | OPHTHALMIC | Status: DC | PRN
  Administered 2015-11-03: 1 [drp] via OPHTHALMIC
  Filled 2015-11-03: qty 15

## 2015-11-03 MED ORDER — ALBUTEROL SULFATE (2.5 MG/3ML) 0.083% IN NEBU: 2.5000 mg | INHALATION_SOLUTION | RESPIRATORY_TRACT | Status: DC | PRN

## 2015-11-03 MED ORDER — MAGNESIUM OXIDE -MG SUPPLEMENT 500 MG PO TABS: 500.0000 mg | ORAL_TABLET | Freq: Every day | ORAL | Status: DC

## 2015-11-03 MED ORDER — LORAZEPAM 0.5 MG PO TABS: 0.5000 mg | ORAL_TABLET | Freq: Four times a day (QID) | ORAL | Status: DC | PRN

## 2015-11-03 NOTE — Consult Note (Signed)
Consultation Note Date: 11/03/2015   Patient Name: Kristine Simmons  DOB: [birth year]  MRN: [id number]  Age / Sex: 81 y.o., female  PCP: Hendricks Limes, MD Referring Physician: Charlynne Cousins, MD  Reason for Consultation: Establishing goals of care    Clinical Assessment/Narrative: MELYNIE BURLING is an 81 y.o. female with history of chronic atrial fibrillation and chronic respiratory failure secondary to COPD and chronic aspiration or to his primary care doctor was found to be weak and hypoxic came to the ER requiring initially a nonrebreather, chest x-ray showed bilateral pleural effusion with significant elevated BNP.  Palliative consulted for goals of care.  Contacts/Participants in Discussion: Patient, her husband, and her daughter, Dotty Primary Decision Maker: Patient   Relationship to Patient self HCPOA: None on chart  SUMMARY OF RECOMMENDATIONS - Discussed goals moving forward with patient, her daughter, and husband.   - We discussed that in light of multiple chronic medical problems that have worsened with this acute problem, care should be focused on interventions that are likely to allow her to achieve goal of getting out of hospital and spending time with family. I discussed with her and her family regarding heroic interventions at the end-of-life and they agree this would not be in line with prior expressed wishes for a natural death or be likely to lead to getting well enough to go back home. They were in agreement with changing CODE STATUS to DO NOT RESUSCITATE. - Her daughter remains worried about her ability to return home due to increased debility and falls - Will continue to follow-up during admission to assist family with decisions regarding discharge and plans for long term goals of care  Code Status/Advance Care Planning: DNR    Code Status Orders        Start     Ordered   11/03/15  1400  Do not attempt resuscitation (DNR)   Continuous    Question Answer Comment  In the event of cardiac or respiratory ARREST Do not call a "code blue"   In the event of cardiac or respiratory ARREST Do not perform Intubation, CPR, defibrillation or ACLS   In the event of cardiac or respiratory ARREST Use medication by any route, position, wound care, and other measures to relive pain and suffering. May use oxygen, suction and manual treatment of airway obstruction as needed for comfort.      11/03/15 1359    Advance Directive Documentation        Most Recent Value   Type of Advance Directive  Healthcare Power of Attorney, Living will   Pre-existing out of facility DNR order (yellow form or pink MOST form)     "MOST" Form in Place?        Other Directives:Living Will  Palliative Prophylaxis:   Bowel Regimen and Delirium Protocol    Psycho-social/Spiritual:  Support System: Strong Desire for further Chaplaincy support: no Additional Recommendations: Education on Hospice  Prognosis: Unable to determine due to acute illness  Discharge Planning: To be determined   Chief Complaint/ Primary Diagnoses: Present on Admission:  . Chronic atrial fibrillation ([place]) . Hypothyroidism . Hyperlipidemia . T12 compression fracture ([place])  I have reviewed the medical record, interviewed the patient and family, and examined the patient. The following aspects are pertinent.  Past Medical History  Diagnosis Date  . Hx of colonic polyps     last colonoscopy 2005, repeat was due 2010 Dr. Sharlett Iles  . Atrial fibrillation ([place])  Dr Percival Spanish  . Fracture 1995, 2009    RUE; LUE Dr. Durward Fortes  . 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 Cystitis   . Complication of anesthesia     difficulty with arousal post op  . Hyperlipidemia   . Hyperlipidemia   . Osteoporosis   . Cerebrovascular accident ([place]) 01/09  . Hypothyroidism   . Dysrhythmia    Social History   Social  History  . Marital Status: Married    Spouse Name: N/A  . Number of Children: N/A  . Years of Education: N/A   Social History Main Topics  . Smoking status: Former Smoker -- 1.00 packs/day for 45 years    Types: Cigarettes    Quit date: 12/23/1993  . Smokeless tobacco: Never Used     Comment: Smoked 1950-1995, up to 1 ppd  . Alcohol Use: No  . Drug Use: No  . Sexual Activity: No   Other Topics Concern  . None   Social History Narrative   Family History  Problem Relation Age of Onset  . Stroke Father     > 12  . Diabetes Father   . Leukemia Mother   . Cancer Mother     leukemia  . Colon cancer Brother     Valve replacement  . Heart attack Brother   . Breast cancer Sister   . Ovarian cancer Sister   . Lung cancer Sister     smoker  . Prostate cancer Brother   . Cancer Brother     bladder   Scheduled Meds: . ampicillin-sulbactam (UNASYN) IV  3 g Intravenous Q8H  . antiseptic oral rinse  7 mL Mouth Rinse BID  . apixaban  2.5 mg Oral BID  . apraclonidine  1 drop Right Eye Q12H  . arformoterol  15 mcg Nebulization BID  . budesonide  0.5 mg Nebulization BID  . budesonide-formoterol  2 puff Inhalation BID  . digoxin  125 mcg Oral Daily  . diltiazem  120 mg Oral Daily  . famotidine  10 mg Oral Daily  . furosemide  40 mg Intravenous 3 times per day  . latanoprost  1 drop Both Eyes QHS  . [START ON 11/04/2015] levothyroxine  50 mcg Oral Once per day on Sun Mon Tue Thu Fri Sat  . [START ON 11/08/2015] levothyroxine  75 mcg Oral Once per day on Wed  . magnesium oxide  400 mg Oral QHS  . polyethylene glycol  17 g Oral Daily  . sertraline  25 mg Oral Daily  . sodium chloride  1 drop Left Eye TID  . timolol  1 drop Both Eyes BID   Continuous Infusions:  PRN Meds:.albuterol, fentaNYL (SUBLIMAZE) injection, LORazepam, ondansetron **OR** ondansetron (ZOFRAN) IV, oxyCODONE-acetaminophen, polyvinyl alcohol, traMADol Medications Prior to Admission:  Prior to Admission  medications   Medication Sig Start Date End Date Taking? Authorizing Provider  acetaminophen (TYLENOL) 325 MG tablet You can take 2 tablets every 4 hours as needed.  You cannot take more than 4000 mg of tylenol per day.  This is also in your sleep aide, and it is in your prescribed pain medicine. So you need to figure out.  Do not take more than 12 tablets of tylenol (acetaminophen) per day. 08/19/14  Yes Earnstine Regal, PA-C  albuterol (VENTOLIN HFA) 108 (90 BASE) MCG/ACT inhaler Inhale 2 puffs into the lungs every 6 (six) hours as needed for wheezing or shortness of breath.   Yes  Historical Provider, MD  apraclonidine (IOPIDINE) 0.5 % ophthalmic solution Place 1 drop into the right eye every 12 (twelve) hours.   Yes Historical Provider, MD  budesonide-formoterol (SYMBICORT) 80-4.5 MCG/ACT inhaler Inhale 2 puffs into the lungs 2 (two) times daily. Patient taking differently: Inhale 2 puffs into the lungs 2 (two) times daily as needed (shortness of breath).  10/09/15  Yes Juanito Doom, MD  calcium carbonate (TUMS - DOSED IN MG ELEMENTAL CALCIUM) 500 MG chewable tablet Chew 2 tablets by mouth daily as needed for indigestion or heartburn (indigestion).   Yes Historical Provider, MD  Cholecalciferol (VITAMIN D) 2000 UNITS tablet Take 2,000 Units by mouth daily.   Yes Historical Provider, MD  Cranberry (CRANBERRY CONCENTRATE) 500 MG CAPS Take 500 mg by mouth 2 (two) times daily.    Yes Historical Provider, MD  DIGOX 125 MCG tablet TAKE 1 TABLET ONCE DAILY. Patient taking differently: TAKE 125 MCG BY MOUTH DAILY 05/26/15  Yes Minus Breeding, MD  diltiazem (CARDIZEM CD) 120 MG 24 hr capsule TAKE (1) CAPSULE DAILY. Patient taking differently: Take 120 mg by mouth daily.  07/04/15  Yes Minus Breeding, MD  ELIQUIS 2.5 MG TABS tablet TAKE 1 TABLET TWICE DAILY. Patient taking differently: TAKE 2.5 MG BY MOUTH TWICE DAILY 08/07/15  Yes Minus Breeding, MD  ENSURE PLUS (ENSURE PLUS) LIQD Take 1 Can by mouth  daily. 12/20/14  Yes Hendricks Limes, MD  estradiol (ESTRACE) 0.1 MG/GM vaginal cream Place 2 g vaginally 2 (two) times a week. On Wednesdays & Saturdays.   Yes Historical Provider, MD  levothyroxine (SYNTHROID, LEVOTHROID) 50 MCG tablet TAKE 1 TABLET ONCE DAILY. Patient taking differently: Take 50 mcg daily except take 75 mcg on Wednesday 05/25/15  Yes Hendricks Limes, MD  LUMIGAN 0.01 % SOLN Place 1 drop into both eyes at bedtime. 04/06/15  Yes Historical Provider, MD  Magnesium Oxide 500 MG TABS Take 500 mg by mouth at bedtime.    Yes Historical Provider, MD  oxyCODONE-acetaminophen (PERCOCET/ROXICET) 5-325 MG tablet Take 1 tablet by mouth every 8 (eight) hours as needed for moderate pain or severe pain. 10/30/15  Yes Forde Dandy, MD  OXYGEN Inhale into the lungs continuous.   Yes Historical Provider, MD  polyethylene glycol powder (GLYCOLAX/MIRALAX) powder Take 17 g by mouth daily. Mix 1 capful of powder and dissolve it in any liquid. Drink once daily. 10/30/15  Yes Forde Dandy, MD  Probiotic Product (MISC INTESTINAL FLORA REGULAT) CAPS Take 1 capsule by mouth daily.     Yes Historical Provider, MD  Propylene Glycol (SYSTANE BALANCE OP) Place 1 drop into both eyes as needed (dry and pain).   Yes Historical Provider, MD  RANITIDINE HCL PO Take 1 tablet by mouth daily as needed (heartburn).   Yes Historical Provider, MD  sertraline (ZOLOFT) 25 MG tablet Take 1 tablet (25 mg total) by mouth daily. 04/19/15  Yes Hendricks Limes, MD  sodium chloride (MURO 128) 5 % ophthalmic solution Place 1 drop into the left eye 3 (three) times daily.   Yes Historical Provider, MD  timolol (TIMOPTIC) 0.5 % ophthalmic solution Place 1 drop into both eyes 2 (two) times daily.    Yes Historical Provider, MD  traMADol (ULTRAM) 50 MG tablet Take 1 tablet (50 mg total) by mouth every 6 (six) hours as needed for moderate pain. 10/27/15  Yes Hendricks Limes, MD  trimethoprim (TRIMPEX) 100 MG tablet Take 100 mg by mouth every  other day.  Yes Historical Provider, MD  VYTORIN 10-40 MG per tablet TAKE 1 TABLET ONCE DAILY. 05/15/15  Yes Hendricks Limes, MD  gabapentin (NEURONTIN) 100 MG capsule One pill @ bedtime as needed for back pain Patient not taking: Reported on 11/02/2015 09/06/15   Hendricks Limes, MD  LORazepam (ATIVAN) 1 MG tablet TAKE 1 TABLET EVERY 8-12 HOURS AS NEEDED. CAN BE HABIT FORMING & MAY AFFECT MENTAL ALERTNESS. Patient not taking: Reported on 11/02/2015 11/23/14   Biagio Borg, MD   Allergies  Allergen Reactions  . Hydrocodone Nausea And Vomiting  . Desmopressin Acetate     REACTION: swelling feet  . Dorzolamide Hcl Other (See Comments)    Burning in eyes and redness  . Prempro [Conj Estrog-Medroxyprogest Ace]     unknown  . Ciprofloxacin     REACTION: vaginal infection    Review of Systems  Constitutional: Positive for activity change, appetite change and fatigue.  Respiratory: Positive for shortness of breath and wheezing.     Physical Exam  Constitutional: She is oriented to person, place, and time. She appears well-developed and well-nourished. No distress.  On facemask  HENT:  Head: Normocephalic and atraumatic.  Eyes: Right eye exhibits no discharge. Left eye exhibits no discharge. No scleral icterus.  Cardiovascular: Normal rate and regular rhythm.   Respiratory: No stridor. She is in respiratory distress (mild).  GI: Soft. She exhibits no distension.  Neurological: She is alert and oriented to person, place, and time. No cranial nerve deficit.  Skin: No erythema.  Psychiatric: She has a normal mood and affect. Her behavior is normal.    Vital Signs: BP 130/71 mmHg  Pulse 66  Temp(Src) 98.3 F (36.8 C) (Oral)  Resp 18  Ht 5\' 7"  (1.702 m)  Wt 59.7 kg (131 lb 9.8 oz)  BMI 20.61 kg/m2  SpO2 92%  SpO2: SpO2: 92 % O2 Device:SpO2: 92 % O2 Flow Rate: .O2 Flow Rate (L/min): 14 L/min  IO: Intake/output summary:  Intake/Output Summary (Last 24 hours) at 11/03/15  2233 Last data filed at 11/03/15 1600  Gross per 24 hour  Intake    300 ml  Output   2500 ml  Net  -2200 ml    LBM: Last BM Date: 11/03/15 Baseline Weight: Weight: 59.7 kg (131 lb 9.8 oz) Most recent weight: Weight: 59.7 kg (131 lb 9.8 oz)      Palliative Assessment/Data:  Flowsheet Rows        Most Recent Value   Intake Tab    Referral Department  Hospitalist   Unit at Time of Referral  Intermediate Care Unit   Palliative Care Primary Diagnosis  Pulmonary   Date Notified  11/03/15   Palliative Care Type  New Palliative care   Reason for referral  Clarify Goals of Care   Date of Admission  11/02/15   Date first seen by Palliative Care  11/03/15   # of days Palliative referral response time  0 Day(s)   # of days IP prior to Palliative referral  1   Clinical Assessment    Psychosocial & Spiritual Assessment    Palliative Care Outcomes    Palliative Care Outcomes  Clarified goals of care, Changed CPR status   Patient/Family wishes: Interventions discontinued/not started   Mechanical Ventilation      Additional Data Reviewed:  CBC:    Component Value Date/Time   WBC 9.7 11/03/2015 0320   HGB 13.1 11/03/2015 0320   HCT 39.4 11/03/2015 0320  PLT 156 11/03/2015 0320   MCV 86.8 11/03/2015 0320   NEUTROABS 7.3 11/02/2015 1617   LYMPHSABS 1.1 11/02/2015 1617   MONOABS 1.0 11/02/2015 1617   EOSABS 0.0 11/02/2015 1617   BASOSABS 0.1 11/02/2015 1617   Comprehensive Metabolic Panel:    Component Value Date/Time   NA 138 11/03/2015 0320   K 3.6 11/03/2015 0320   CL 95* 11/03/2015 0320   CO2 31 11/03/2015 0320   BUN 25* 11/03/2015 0320   CREATININE 0.99 11/03/2015 0320   GLUCOSE 85 11/03/2015 0320   CALCIUM 8.5* 11/03/2015 0320   AST 239* 11/03/2015 1005   ALT 283* 11/03/2015 1005   ALKPHOS 60 11/03/2015 1005   BILITOT 1.2 11/03/2015 1005   PROT 5.7* 11/03/2015 1005   ALBUMIN 3.4* 11/03/2015 1005     Time In: 1240 Time Out: 1400 Time Total: 80 Greater than  50%  of this time was spent counseling and coordinating care related to the above assessment and plan.  Signed by: Micheline Rough, MD  Micheline Rough, MD  11/03/2015, 10:33 PM  Please contact Palliative Medicine Team phone at [phone] for questions and concerns.           [place]

## 2015-11-03 NOTE — Evaluation (Signed)
Clinical/Bedside Swallow Evaluation Patient Details  Name: Kristine Simmons MRN: [id number] Date of Birth: Jun 25, 1927  Today's Date: 11/03/2015 Time: SLP Start Time (ACUTE ONLY): 1122 SLP Stop Time (ACUTE ONLY): 1144 SLP Time Calculation (min) (ACUTE ONLY): 22 min  Past Medical History:  Past Medical History  Diagnosis Date  . Hx of colonic polyps     last colonoscopy 2005, repeat was due 2010 Dr. Sharlett Iles  . Atrial fibrillation ([place])     Dr Percival Spanish  . Fracture 1995, 2009    RUE; LUE Dr. Durward Fortes  . 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 Cystitis   . Complication of anesthesia     difficulty with arousal post op  . Hyperlipidemia   . Hyperlipidemia   . Osteoporosis   . Cerebrovascular accident ([place]) 01/09  . Hypothyroidism   . Dysrhythmia    Past Surgical History:  Past Surgical History  Procedure Laterality Date  . Tonsillectomy and adenoidectomy    . Vocal cord polyps    . Thyroid needle biopsy  2002  . Appendectomy    . Cataract extraction      [place]  . Colonoscopy w/ polypectomy  2005    Diverticulosis;Dr. Sharlett Iles  . Total hip arthroplasty  2008    CHF & RAF post op   . Total shoulder replacement  2010    Dr Durward Fortes - left shoulder  . Mohs surgery  2013    nasal Basal Cell; Dr Sarajane Jews  . Excision metacarpal mass Right 08/17/2013    Procedure: RIGHT LONG EXCISION MASS AND DIP JOINT DEBRIDEMENT;  Surgeon: Tennis Must, MD;  Location: [facility];  Service: Orthopedics;  Laterality: Right;  . Femoral hernia repair Right 08/17/2014    Procedure: OPEN REPAIR RIGHT FEMORAL HERNIA  WITH INSERTION OF MESH;  Surgeon: Adin Hector, MD;  Location: [facility];  Service: General;  Laterality: Right;  . Insertion of mesh  08/17/2014    Procedure: INSERTION OF MESH;  Surgeon: Adin Hector, MD;  Location: [facility];  Service: General;;  . Inguinal hernia repair Right 11/15/2014    Procedure: LAPAROSCOPIC RIGHT INGUINAL HERNIA WITH MESH;   Surgeon: Alphonsa Overall, MD;  Location: [facility];  Service: General;  Laterality: Right;   HPI:  81 yo female with PMH of COPD, CVA, who was admitted for hypoxia. Pt has had multiple swallow evaluations in the past, all of which have recommended regular diets and thin liquids with concern for a primary esophageal component.    Assessment / Plan / Recommendation Clinical Impression  Pt's oropharyngeal swallow appears grossly similar to previous swallow assessments, all of which have shown signs concerning for an esophageal dysphagia. Today, pt appears to swallow swiftly with no signs of difficulty until after PO intake, at which time she had an episode of delayed coughing. Pt required Min-Mod cues from SLP for placement of venti-mask in between bites and sips to keep SpO2 at 95% or above. Pt's daughter also voices concerns that pt has increased difficulty with solids at home, and is spending prolonged periods of time taking in POs. Given the above as well as pt's current respiratory status, would recommend Dys 2 diet and thin liquids, avoiding straws and taking meds with puree. SLP to follow up for tolerance.    Aspiration Risk  Mild aspiration risk    Diet Recommendation  Dys 2 diet, thin liquids Cup, no straws   Medication Administration: Whole meds with puree  Other  Recommendations Recommended Consults: Consider esophageal assessment Oral Care Recommendations: Oral care BID   Follow up Recommendations   (tba)    Frequency and Duration min 2x/week  2 weeks       Swallow Study   General HPI: 81 yo female with PMH of COPD, CVA, who was admitted for hypoxia. Pt has had multiple swallow evaluations in the past, all of which have recommended regular diets and thin liquids with concern for a primary esophageal component.  Type of Study: Bedside Swallow Evaluation Previous Swallow Assessment: see HPI Diet Prior to this Study: Regular;Thin liquids Temperature Spikes Noted: No Respiratory  Status: Venti-mask History of Recent Intubation: No Behavior/Cognition: Alert;Cooperative;Pleasant mood Oral Cavity Assessment: Within Functional Limits Oral Care Completed by SLP: No Oral Cavity - Dentition: Adequate natural dentition Vision: Functional for self-feeding Self-Feeding Abilities: Able to feed self Patient Positioning: Upright in bed Baseline Vocal Quality: Normal    Oral/Motor/Sensory Function Overall Oral Motor/Sensory Function: Within functional limits   Ice Chips Ice chips: Not tested   Thin Liquid Thin Liquid: Impaired Presentation: Cup;Self Fed;Straw Pharyngeal  Phase Impairments: Cough - Delayed    Nectar Thick Nectar Thick Liquid: Not tested   Honey Thick Honey Thick Liquid: Not tested   Puree Puree: Impaired Presentation: Self Fed;Spoon Pharyngeal Phase Impairments: Cough - Delayed   Solid Solid: Impaired Presentation: Self Fed Pharyngeal Phase Impairments: Cough - Delayed      Germain Osgood, M.A. CCC-SLP [phone]  Germain Osgood 11/03/2015,11:59 AM

## 2015-11-03 NOTE — Progress Notes (Signed)
I met with the patient, her daughter, and her husband.  We discussed that in light of multiple chronic medical problems that have worsened with this acute problem, care should be focused on interventions that are likely to allow the patient to achieve goal of getting back to home and spending time with family. I discussed with family regarding heroic interventions at the end-of-life and they agree this would not be in line with prior expressed wishes for a natural death or be likely to lead to getting well enough to go back home. They were in agreement with changing CODE STATUS to DO NOT RESUSCITATE.  Continue current care otherwise.  Full consult note to follow.  Micheline Rough, MD [facility] Team [phone]

## 2015-11-03 NOTE — Progress Notes (Signed)
[facility] PROGRESS NOTE    Progress Note   Kristine Simmons [id number] DOB: [birth year] DOA: 11/02/2015 PCP: Unice Cobble, MD   Brief Narrative:   Kristine Simmons is an 81 y.o. female historical history of chronic atrial fibrillation on this chronic respiratory failure secondary to COPD and chronic aspiration or to his primary care doctor was found to be weak and hypoxic came to the ER requiring initially a nonrebreather, chest x-ray showed bilateral pleural effusion with significant elevated BNP  Assessment/Plan:  Acute on chronic respiratory failure with hypoxia ([place]) likely due to acute diastolic heart failure: - Confusing picture she has positive JVD, chest x-ray that shows bilateral pleural effusion, she had previous 2-D echo in 2015 that showed diastolic dysfunction.  - Still aspiration pneumonia is in the differential, so will continue IV Unasyn. Pro-calcitonin was less than 0.10, blood cultures pending. - She is not on Lasix at home and has no dietary restrictions - We'll increase her Lasix continue to monitor strict I's and O's, will continue monitor her blood pressure as she has an elevated pulmonary pressures on previous echo. Her creatinine has improved with IV Lasix and will continue monitor electrolytes. We'll change her nonrebreather to Ventimask and continue to titrate her oxygen as tolerated. -  Had an elevated d-dimer I think this is acting as an acute phase reactant she is not tachycardic. She is on Eluquis at home, which makes thromboembolic event unlikely. - Cardiac markers continue to be negative, lactic acid is 1.3.  - 2-D echo is pending.  - And patient had a long discussion about goals of care, we discussed with DO NOT RESUSCITATE and DO NOT INTUBATE means. She relates that she would like to have this conversation with her husband and will like to meet later this afternoon.   Transaminitis: - Please likely due to acute decompensated heart failure, her  bilirubin is within normal limits as well as her alkaline phosphatase. - Recheck a CMP tomorrow.  COPD: Has remained stable.  Acute kidney injury/mild hyperkalemia: Is likely due to acute decompensated heart failure to improve with IV diuresis.  Hypothyroidism: - Continue Synthroid.   Hyperlipidemia Continue statins.  Chronic atrial fibrillation ([place]): - Rate controlled continue current meds.   T12 compression fracture (HCC) - Continue brace and narcotics and MiraLAX to prevent constipation.     DVT Prophylaxis - Lovenox ordered.  Family Communication: none Disposition Plan: Home when stable. Code Status:     Code Status Orders        Start     Ordered   11/02/15 2306  Full code   Continuous     11/02/15 2307    Advance Directive Documentation        Most Recent Value   Type of Advance Directive  Healthcare Power of Attorney, Living will   Pre-existing out of facility DNR order (yellow form or pink MOST form)     "MOST" Form in Place?          IV Access:    Peripheral IV   Procedures and diagnostic studies:   Dg Chest 2 View  11/02/2015  CLINICAL DATA:  Recent back injury, shortness of breath. EXAM: CHEST  2 VIEW COMPARISON:  October 10, 2015 FINDINGS: The heart size and mediastinal contours are stable. The heart size is enlarged. There are small bilateral pleural effusions with compression atelectasis of bilateral lung bases. There is no focal pneumonia. Left shoulder replacement is identified unchanged. IMPRESSION: Small bilateral pleural effusions with  adjacent compression atelectasis of the lung bases. Electronically Signed   By: Abelardo Diesel M.D.   On: 11/02/2015 16:48     Medical Consultants:    None.  Anti-Infectives:   Anti-infectives    Start     Dose/Rate Route Frequency Ordered Stop   11/02/15 2330  Ampicillin-Sulbactam (UNASYN) 3 g in sodium chloride 0.9 % 100 mL IVPB     3 g 100 mL/hr over 60 Minutes Intravenous Every 8 hours  11/02/15 2317        Subjective:    Kristine Simmons  she relates her shortness of breath is unchanged still feels weak.  Objective:    Filed Vitals:   11/03/15 0335 11/03/15 0400 11/03/15 0440 11/03/15 0600  BP: 148/93 112/54  91/48  Pulse: 57 68  64  Temp: 98.5 F (36.9 C)     TempSrc: Oral     Resp: 18 18  15   Height:      Weight:   59.7 kg (131 lb 9.8 oz)   SpO2: 92% 94%      Intake/Output Summary (Last 24 hours) at 11/03/15 0726 Last data filed at 11/03/15 [id number]  Gross per 24 hour  Intake    425 ml  Output    900 ml  Net   -475 ml   Filed Weights   11/02/15 2210 11/03/15 0440  Weight: 59.7 kg (131 lb 9.8 oz) 59.7 kg (131 lb 9.8 oz)    Exam: Gen:  NAD Cardiovascular:  RRR, No M/R/G Chest and lungs:   good air movement with crackles on the right positive JVD . Abdomen:  Abdomen soft, NT/ND, + BS Extremities:  No C/E/C   Data Reviewed:    Labs: Basic Metabolic Panel:  Recent Labs Lab 10/30/15 1327 11/02/15 1617 11/03/15 0005 11/03/15 0320  NA 138 134* 137 138  K 4.4 5.6* 3.7 3.6  CL 97* 94* 93* 95*  CO2 32 32 34* 31  GLUCOSE 98 134* 93 85  BUN 16 31* 27* 25*  CREATININE 0.81 1.13* 1.08* 0.99  CALCIUM 9.5 9.3 8.9 8.5*   GFR Estimated Creatinine Clearance: 38.4 mL/min (by C-G formula based on Cr of 0.99). Liver Function Tests:  Recent Labs Lab 11/02/15 1617 11/03/15 0320  AST 292* 234*  ALT 319* 275*  ALKPHOS 61 54  BILITOT 1.2 1.2  PROT 6.1* 5.5*  ALBUMIN 3.4* 3.4*   No results for input(s): LIPASE, AMYLASE in the last 168 hours. No results for input(s): AMMONIA in the last 168 hours. Coagulation profile No results for input(s): INR, PROTIME in the last 168 hours.  CBC:  Recent Labs Lab 10/30/15 1327 11/02/15 1617 11/03/15 0320  WBC 7.3 9.4 9.7  NEUTROABS 5.2 7.3  --   HGB 14.7 13.3 13.1  HCT 45.2 42.6 39.4  MCV 88.5 90.4 86.8  PLT 186 199 156   Cardiac Enzymes:  Recent Labs Lab 11/02/15 1617 11/03/15 0005    TROPONINI 0.04* 0.03   BNP (last 3 results)  Recent Labs  11/18/14 0341  PROBNP 6363.0*   CBG:  Recent Labs Lab 11/03/15 0040 11/03/15 0620  GLUCAP 79 82   D-Dimer:  Recent Labs  11/03/15 0005  DDIMER 6.56*   Hgb A1c: No results for input(s): HGBA1C in the last 72 hours. Lipid Profile: No results for input(s): CHOL, HDL, LDLCALC, TRIG, CHOLHDL, LDLDIRECT in the last 72 hours. Thyroid function studies: No results for input(s): TSH, T4TOTAL, T3FREE, THYROIDAB in the last 72 hours.  Invalid input(s):  FREET3 Anemia work up: No results for input(s): VITAMINB12, FOLATE, FERRITIN, TIBC, IRON, RETICCTPCT in the last 72 hours. Sepsis Labs:  Recent Labs Lab 10/30/15 1327 11/02/15 1617 11/03/15 0005 11/03/15 0025 11/03/15 0320  PROCALCITON  --   --  <0.10  --   --   WBC 7.3 9.4  --   --  9.7  LATICACIDVEN  --   --   --  1.3  --    Microbiology Recent Results (from the past 240 hour(s))  MRSA PCR Screening     Status: None   Collection Time: 11/02/15 10:57 PM  Result Value Ref Range Status   MRSA by PCR NEGATIVE NEGATIVE Final    Comment:        The GeneXpert MRSA Assay (FDA approved for NASAL specimens only), is one component of a comprehensive MRSA colonization surveillance program. It is not intended to diagnose MRSA infection nor to guide or monitor treatment for MRSA infections.      Medications:   . ampicillin-sulbactam (UNASYN) IV  3 g Intravenous Q8H  . antiseptic oral rinse  7 mL Mouth Rinse BID  . apixaban  2.5 mg Oral BID  . apraclonidine  1 drop Right Eye Q12H  . arformoterol  15 mcg Nebulization BID  . budesonide  0.5 mg Nebulization BID  . digoxin  125 mcg Oral Daily  . diltiazem  120 mg Oral Daily  . ezetimibe-simvastatin  1 tablet Oral Daily  . furosemide  40 mg Intravenous Daily  . levothyroxine  50 mcg Oral QAC breakfast  . sertraline  25 mg Oral Daily  . sodium chloride  1 drop Left Eye TID  . timolol  1 drop Both Eyes BID    Continuous Infusions:   Time spent: 25 min   LOS: 1 day   Charlynne Cousins  [facility] Pager [phone]  *Please refer to [email or web address], password [email or web address] to get updated schedule on who will round on this patient, as hospitalists switch teams weekly. If 7PM-7AM, please contact night-coverage at [email or web address], password [email or web address] for any overnight needs.  11/03/2015, 7:26 AM

## 2015-11-03 NOTE — Progress Notes (Signed)
Utilization review completed. Delvina Mizzell, RN, BSN. 

## 2015-11-03 NOTE — Progress Notes (Signed)
  Echocardiogram 2D Echocardiogram has been performed.  Kristine Simmons 11/03/2015, 2:48 PM

## 2015-11-03 NOTE — Progress Notes (Signed)
Utilization review completed. Zakiyyah Savannah, RN, BSN. 

## 2015-11-04 DIAGNOSIS — Z7189 Other specified counseling: Secondary | ICD-10-CM | POA: Diagnosis present

## 2015-11-04 DIAGNOSIS — Z515 Encounter for palliative care: Secondary | ICD-10-CM | POA: Diagnosis present

## 2015-11-04 LAB — HEPATITIS PANEL, ACUTE
HCV Ab: 0.4 s/co ratio (ref 0.0–0.9)
HEP A IGM: NEGATIVE
HEP B C IGM: NEGATIVE
HEP B S AG: NEGATIVE

## 2015-11-04 LAB — PROCALCITONIN: Procalcitonin: <0.1 ng/mL

## 2015-11-04 LAB — BASIC METABOLIC PANEL
ANION GAP: 14 (ref 5–15)
BUN: 14 mg/dL (ref 6–20)
CALCIUM: 8.3 mg/dL — AB (ref 8.9–10.3)
CO2: 38 mmol/L — AB (ref 22–32)
Chloride: 85 mmol/L — ABNORMAL LOW (ref 101–111)
Creatinine, Ser: 0.96 mg/dL (ref 0.44–1.00)
GFR, EST NON AFRICAN AMERICAN: 52 mL/min — AB (ref >60)
GLUCOSE: 105 mg/dL — AB (ref 65–99)
POTASSIUM: 2.9 mmol/L — AB (ref 3.5–5.1)
Sodium: 137 mmol/L (ref 135–145)

## 2015-11-04 LAB — GLUCOSE, CAPILLARY
GLUCOSE-CAPILLARY: 112 mg/dL — AB (ref 65–99)
GLUCOSE-CAPILLARY: 114 mg/dL — AB (ref 65–99)
Glucose-Capillary: 100 mg/dL — ABNORMAL HIGH (ref 65–99)

## 2015-11-04 MED ORDER — POTASSIUM CHLORIDE CRYS ER 20 MEQ PO TBCR
40.0000 meq | EXTENDED_RELEASE_TABLET | Freq: Two times a day (BID) | ORAL | Status: AC
  Administered 2015-11-04 (×2): 40 meq via ORAL
  Filled 2015-11-04 (×2): qty 2

## 2015-11-04 MED ORDER — POTASSIUM CHLORIDE CRYS ER 20 MEQ PO TBCR
40.0000 meq | EXTENDED_RELEASE_TABLET | Freq: Two times a day (BID) | ORAL | Status: DC
Start: 2015-11-04 — End: 2015-11-04

## 2015-11-04 NOTE — Progress Notes (Signed)
[facility] PROGRESS NOTE    Progress Note   SHAUNTEA Simmons [id number] DOB: 1927/05/13 DOA: 11/02/2015 PCP: Unice Cobble, MD   Brief Narrative:   Kristine Simmons is an 81 y.o. female historical history of chronic atrial fibrillation on this chronic respiratory failure secondary to COPD and chronic aspiration or to his primary care doctor was found to be weak and hypoxic came to the ER requiring initially a nonrebreather, chest x-ray showed bilateral pleural effusion with significant elevated BNP  Assessment/Plan:  Acute on chronic respiratory failure with hypoxia ([place]) likely due to acute diastolic heart failure: - Has positive JVD, echo showed no diastolic heart failure. - Will continue IV Unasyn.blood cultures pending. - Cont IV Lasix at home still vol overloaded. Good UOP. - try her on nasal canula. - Appreciate PMT assistance, now DNR?DNI.  Transaminitis: - Labs pending - Hold statins labs pending  COPD: Has remained stable.  Acute kidney injury/mild hyperkalemia: Cont to monitor Cr, cont IV diuresis.  Hypothyroidism: - Continue Synthroid.   Hyperlipidemia Continue statins.  Chronic atrial fibrillation ([place]): - Rate controlled continue current meds.  - cont eluquis.  T12 compression fracture (HCC) - Continue brace and narcotics and MiraLAX to prevent constipation.     DVT Prophylaxis - Lovenox ordered.  Family Communication: none Disposition Plan: Home when stable. Code Status:     Code Status Orders        Start     Ordered   11/02/15 2306  Full code   Continuous     11/02/15 2307    Advance Directive Documentation        Most Recent Value   Type of Advance Directive  Healthcare Power of Attorney, Living will   Pre-existing out of facility DNR order (yellow form or pink MOST form)     "MOST" Form in Place?          IV Access:    Peripheral IV   Procedures and diagnostic studies:   Dg Chest 2 View  11/02/2015  CLINICAL  DATA:  Recent back injury, shortness of breath. EXAM: CHEST  2 VIEW COMPARISON:  October 10, 2015 FINDINGS: The heart size and mediastinal contours are stable. The heart size is enlarged. There are small bilateral pleural effusions with compression atelectasis of bilateral lung bases. There is no focal pneumonia. Left shoulder replacement is identified unchanged. IMPRESSION: Small bilateral pleural effusions with adjacent compression atelectasis of the lung bases. Electronically Signed   By: Abelardo Diesel M.D.   On: 11/02/2015 16:48     Medical Consultants:    None.  Anti-Infectives:   Anti-infectives    Start     Dose/Rate Route Frequency Ordered Stop   11/02/15 2330  Ampicillin-Sulbactam (UNASYN) 3 g in sodium chloride 0.9 % 100 mL IVPB     3 g 100 mL/hr over 60 Minutes Intravenous Every 8 hours 11/02/15 2317        Subjective:    [name]  she relates her shortness of breath is unchanged still feels weak.  Objective:    Filed Vitals:   11/03/15 2051 11/03/15 2347 11/04/15 0433 11/04/15 0500  BP:  105/57 120/49   Pulse: 66 72 76   Temp:  98.7 F (37.1 C) 97.9 F (36.6 C)   TempSrc:  Axillary Oral   Resp: 18 14 22    Height:      Weight:    57.3 kg (126 lb 5.2 oz)  SpO2: 92% 91% 94%  Intake/Output Summary (Last 24 hours) at 11/04/15 0720 Last data filed at 11/04/15 [id number]  Gross per 24 hour  Intake    300 ml  Output   2250 ml  Net  -1950 ml   Filed Weights   11/02/15 2210 11/03/15 0440 11/04/15 0500  Weight: 59.7 kg (131 lb 9.8 oz) 59.7 kg (131 lb 9.8 oz) 57.3 kg (126 lb 5.2 oz)    Exam: Gen:  NAD Cardiovascular:  RRR,+JVD Chest and lungs:   good air movement with crackles. Abdomen:  Abdomen soft, NT/ND, + BS Extremities:  No C/E/C   Data Reviewed:    Labs: Basic Metabolic Panel:  Recent Labs Lab 10/30/15 1327 11/02/15 1617 11/03/15 0005 11/03/15 0320  NA 138 134* 137 138  K 4.4 5.6* 3.7 3.6  CL 97* 94* 93* 95*  CO2 32 32 34* 31    GLUCOSE 98 134* 93 85  BUN 16 31* 27* 25*  CREATININE 0.81 1.13* 1.08* 0.99  CALCIUM 9.5 9.3 8.9 8.5*   GFR Estimated Creatinine Clearance: 36.9 mL/min (by C-G formula based on Cr of 0.99). Liver Function Tests:  Recent Labs Lab 11/02/15 1617 11/03/15 0320 11/03/15 1005  AST 292* 234* 239*  ALT 319* 275* 283*  ALKPHOS 61 54 60  BILITOT 1.2 1.2 1.2  PROT 6.1* 5.5* 5.7*  ALBUMIN 3.4* 3.4* 3.4*   No results for input(s): LIPASE, AMYLASE in the last 168 hours.  Recent Labs Lab 11/03/15 0846  AMMONIA 16   Coagulation profile  Recent Labs Lab 11/03/15 0846  INR 1.47    CBC:  Recent Labs Lab 10/30/15 1327 11/02/15 1617 11/03/15 0320  WBC 7.3 9.4 9.7  NEUTROABS 5.2 7.3  --   HGB 14.7 13.3 13.1  HCT 45.2 42.6 39.4  MCV 88.5 90.4 86.8  PLT 186 199 156   Cardiac Enzymes:  Recent Labs Lab 11/02/15 1617 11/03/15 0005 11/03/15 0700  TROPONINI 0.04* 0.03 0.03   BNP (last 3 results)  Recent Labs  11/18/14 0341  PROBNP 6363.0*   CBG:  Recent Labs Lab 11/03/15 0620 11/03/15 0810 11/03/15 1208 11/03/15 2349 11/04/15 0657  GLUCAP 82 90 121* 162* 100*   D-Dimer:  Recent Labs  11/03/15 0005  DDIMER 6.56*   Hgb A1c: No results for input(s): HGBA1C in the last 72 hours. Lipid Profile: No results for input(s): CHOL, HDL, LDLCALC, TRIG, CHOLHDL, LDLDIRECT in the last 72 hours. Thyroid function studies: No results for input(s): TSH, T4TOTAL, T3FREE, THYROIDAB in the last 72 hours.  Invalid input(s): FREET3 Anemia work up: No results for input(s): VITAMINB12, FOLATE, FERRITIN, TIBC, IRON, RETICCTPCT in the last 72 hours. Sepsis Labs:  Recent Labs Lab 10/30/15 1327 11/02/15 1617 11/03/15 0005 11/03/15 0025 11/03/15 0320 11/04/15 0402  PROCALCITON  --   --  <0.10  --   --  <0.10  WBC 7.3 9.4  --   --  9.7  --   LATICACIDVEN  --   --   --  1.3  --   --    Microbiology Recent Results (from the past 240 hour(s))  MRSA PCR Screening      Status: None   Collection Time: 11/02/15 10:57 PM  Result Value Ref Range Status   MRSA by PCR NEGATIVE NEGATIVE Final    Comment:        The GeneXpert MRSA Assay (FDA approved for NASAL specimens only), is one component of a comprehensive MRSA colonization surveillance program. It is not intended to diagnose MRSA infection nor  to guide or monitor treatment for MRSA infections.      Medications:   . ampicillin-sulbactam (UNASYN) IV  3 g Intravenous Q8H  . antiseptic oral rinse  7 mL Mouth Rinse BID  . apixaban  2.5 mg Oral BID  . apraclonidine  1 drop Right Eye Q12H  . arformoterol  15 mcg Nebulization BID  . budesonide  0.5 mg Nebulization BID  . budesonide-formoterol  2 puff Inhalation BID  . digoxin  125 mcg Oral Daily  . diltiazem  120 mg Oral Daily  . famotidine  10 mg Oral Daily  . furosemide  40 mg Intravenous 3 times per day  . latanoprost  1 drop Both Eyes QHS  . levothyroxine  50 mcg Oral Once per day on Sun Mon Tue Thu Fri Sat  . [START ON 11/08/2015] levothyroxine  75 mcg Oral Once per day on Wed  . magnesium oxide  400 mg Oral QHS  . polyethylene glycol  17 g Oral Daily  . sertraline  25 mg Oral Daily  . sodium chloride  1 drop Left Eye TID  . timolol  1 drop Both Eyes BID   Continuous Infusions:   Time spent: 25 min   LOS: 2 days   Charlynne Cousins  [facility] Pager [phone]  *Please refer to [email or web address], password [email or web address] to get updated schedule on who will round on this patient, as hospitalists switch teams weekly. If 7PM-7AM, please contact night-coverage at [email or web address], password [email or web address] for any overnight needs.  11/04/2015, 7:20 AM

## 2015-11-04 NOTE — Progress Notes (Signed)
Daily Progress Note   Patient Name: Kristine Simmons       Date: 11/04/2015 DOB: 1927-01-29  Age: 81 y.o. MRN#: [id number] Attending Physician: Charlynne Cousins, MD Primary Care Physician: Unice Cobble, MD Admit Date: 11/02/2015  Reason for Consultation/Follow-up: Establishing goals of care  Subjective: CHERILYNN Simmons is an 81 y.o. female with history of chronic atrial fibrillation and chronic respiratory failure secondary to COPD and chronic aspiration or to his primary care doctor was found to be weak and hypoxic came to the ER requiring initially a nonrebreather, chest x-ray showed bilateral pleural effusion with significant elevated BNP. Palliative consulted for goals of care. Interval Events: Met with Patient, her husband, and her 2 daughters. She reports feeling much better today than yesterday and is encouraged by her progress to this point in time. At the same time, she realizes that she continues to have progressive disease that continues to worsen. Everyone in her family reports being concerned due to the fact she has been having increasing falls at home.  We talked about short-term goal of discharging from hospital.  Her daughters feel that she would need to go somewhere for rehabilitation on discharge and she is agreeable to this.  We also talked about long-term goals and discussed that the hospital can be useful as long as she is getting well enough from care she receives at the hospital to enjoy her time at home, but there is going to come a time in the future where, if her goal is to be out of the hospital, she may be better served to plan on bringing care to her rather than repeated trips to the hospital. We discussed hospice as a tool that may be beneficial in this goal when she reaches a point where we are trying to fix problems that are not fixable.  She and her family are in agreement that she is not at a point where she would consider hospice at this time. They do  report being appreciative of discussion about what the future may hold and knowing that this is an option for care in the future.   Length of Stay: 2 days  Current Medications: Scheduled Meds:  . ampicillin-sulbactam (UNASYN) IV  3 g Intravenous Q8H  . antiseptic oral rinse  7 mL Mouth Rinse BID  . apixaban  2.5 mg Oral BID  . apraclonidine  1 drop Right Eye Q12H  . arformoterol  15 mcg Nebulization BID  . budesonide  0.5 mg Nebulization BID  . budesonide-formoterol  2 puff Inhalation BID  . digoxin  125 mcg Oral Daily  . diltiazem  120 mg Oral Daily  . famotidine  10 mg Oral Daily  . furosemide  40 mg Intravenous 3 times per day  . latanoprost  1 drop Both Eyes QHS  . levothyroxine  50 mcg Oral Once per day on Sun Mon Tue Thu Fri Sat  . [START ON 11/08/2015] levothyroxine  75 mcg Oral Once per day on Wed  . magnesium oxide  400 mg Oral QHS  . polyethylene glycol  17 g Oral Daily  . potassium chloride  40 mEq Oral BID  . sertraline  25 mg Oral Daily  . sodium chloride  1 drop Left Eye TID  . timolol  1 drop Both Eyes BID    Continuous Infusions:    PRN Meds: albuterol, fentaNYL (SUBLIMAZE) injection, LORazepam, ondansetron **OR** ondansetron (ZOFRAN) IV, oxyCODONE-acetaminophen, polyvinyl alcohol, traMADol   Vital Signs: BP 121/60 mmHg  Pulse 61  Temp(Src) 97.5 F (36.4 C) (Oral)  Resp 17  Ht 5' 7"  (1.702 m)  Wt 57.3 kg (126 lb 5.2 oz)  BMI 19.78 kg/m2  SpO2 93% SpO2: SpO2: 93 % O2 Device: O2 Device: Nasal Cannula O2 Flow Rate: O2 Flow Rate (L/min): 5 L/min  Intake/output summary:  Intake/Output Summary (Last 24 hours) at 11/04/15 1815 Last data filed at 11/04/15 1720  Gross per 24 hour  Intake    680 ml  Output   3600 ml  Net  -2920 ml   LBM:   Baseline Weight: Weight: 59.7 kg (131 lb 9.8 oz) Most recent weight: Weight: 57.3 kg (126 lb 5.2 oz)  Physical Exam: Constitutional: She is oriented to person, place, and time. She appears well-developed and  well-nourished. No distress.  On nasal cannula  HENT:  Head: Normocephalic and atraumatic.  Eyes: Right eye exhibits no discharge. Left eye exhibits no discharge. No scleral icterus.  Cardiovascular: Normal rate and regular rhythm.  Respiratory: No stridor. No distress GI: Soft. She exhibits no distension.  Neurological: She is alert and oriented to person, place, and time. No cranial nerve deficit.  Skin: No erythema.  Psychiatric: She has a normal mood and affect. Her behavior is normal.               Additional Data Reviewed: Recent Labs     11/02/15  1617   11/03/15  0320  11/04/15  0402  WBC  9.4   --   9.7   --   HGB  13.3   --   13.1   --   PLT  199   --   156   --   NA  134*   < >  138  137  BUN  31*   < >  25*  14  CREATININE  1.13*   < >  0.99  0.96   < > = values in this interval not displayed.     Problem List:  Patient Active Problem List   Diagnosis Date Noted  . Acute diastolic heart failure ([place]) 11/03/2015  . Respiratory distress 11/02/2015  . Acute on chronic respiratory failure with hypoxia ([place]) 11/02/2015  . Chronic atrial fibrillation ([place]) 11/02/2015  . T12 compression fracture ([place]) 11/02/2015  . Acute respiratory failure with hypoxia ([place]) 11/02/2015  . Acute on chronic respiratory failure ([place]) 04/12/2015  . Elevated LFTs 04/12/2015  . Aspiration pneumonia ([place]) 04/09/2015  . Altered mental status   . CAP (community acquired pneumonia) 04/08/2015  . Dysphagia, pharyngoesophageal phase 03/07/2015  . Recurrent inguinal hernia with obstruction 11/11/2014  . Depression 09/22/2014  . Hypoxemia 09/22/2014  . Aortic stenosis, mild 08/15/2014  . Chronic anticoagulation 08/15/2014  . Recurrent femoral hernia with obstruction without gangrene 08/15/2014  . History of CVA (cerebrovascular accident) 08/15/2014  . Squamous cell carcinoma of neck ([place]) 03/09/2014  . Cough 09/16/2012  . Diverticulosis 09/11/2011  . Glaucoma 09/11/2011  . CAROTID  STENOSIS 08/08/2009  . HEARING LOSS 11/22/2008  . COPD, Gold grade A 04/08/2008  . HERNIA 04/08/2008  . ARTHRITIS 04/08/2008  . VITAMIN D DEFICIENCY 02/01/2008  . Atrial fibrillation ([place]) 02/01/2008  . HYPERTENSION, ESSENTIAL NOS 12/01/2007  . OSTEOPOROSIS 12/01/2007  . Hypothyroidism 08/06/2007  . DISEQUILIBRIUM 07/24/2007  . G E R D 06/02/2007  . THYROID NODULE, LEFT 04/10/2007  . Hyperlipidemia 04/10/2007  . COLONIC POLYPS, HX OF 04/10/2007     Palliative Care Assessment & Plan    Code  Status:  DNR  Goals of Care: I met with Ms Stringfellow, her husband, and her two daughters. We reviewed a MOST form and discussed how to develop plan of care to focus on continuing therapies that would maximize chance of being well enough to enjoy time outside the hospital and limiting therapies not in line with this goal. Discussed again regarding heroic interventions at the end-of-life. There is agreement this would not be in line with prior expressed wishes for a natural death or be likely to lead to getting well enough to go back home. CODE STATUS remains DO NOT RESUSCITATE. We completed MOST form today. DNR, Limited additional interventions, IVF and ABX if indicated, no feeding tube. - Her daughters remain worried about her ability to return home due to increased debility and falls. Patient reports being agreeable to go to facility for rehab on discharge if she qualifies - Will continue to follow-up during admission to assist family with decisions regarding discharge and plans for long term goals of care   Palliative Prophylaxis:   Bowel Regimen and Delirium Protocol   Psycho-social/Spiritual:  Support System: Strong Desire for further Chaplaincy support: no Additional Recommendations: Education on Hospice  Prognosis: Unable to determine due to acute illness  Discharge Planning: To be determined   Care plan was discussed with patient and her family  Thank you for allowing the  Palliative Medicine Team to assist in the care of this patient.   Time In: 1400 Time Out: 1440 Total Time 40 Prolonged Time Billed no    Greater than 50%  of this time was spent counseling and coordinating care related to the above assessment and plan.   Micheline Rough, MD  11/04/2015, 6:15 PM  Please contact Palliative Medicine Team phone at [phone] for questions and concerns.

## 2015-11-05 DIAGNOSIS — R740 Nonspecific elevation of levels of transaminase and lactic acid dehydrogenase [LDH]: Secondary | ICD-10-CM

## 2015-11-05 DIAGNOSIS — I1310 Hypertensive heart and chronic kidney disease without heart failure, with stage 1 through stage 4 chronic kidney disease, or unspecified chronic kidney disease: Secondary | ICD-10-CM | POA: Diagnosis present

## 2015-11-05 HISTORY — DX: Hypertensive heart and chronic kidney disease without heart failure, with stage 1 through stage 4 chronic kidney disease, or unspecified chronic kidney disease: I13.10

## 2015-11-05 LAB — GLUCOSE, CAPILLARY
GLUCOSE-CAPILLARY: 109 mg/dL — AB (ref 65–99)
GLUCOSE-CAPILLARY: 141 mg/dL — AB (ref 65–99)
Glucose-Capillary: 146 mg/dL — ABNORMAL HIGH (ref 65–99)
Glucose-Capillary: 115 mg/dL — ABNORMAL HIGH (ref 65–99)

## 2015-11-05 LAB — BASIC METABOLIC PANEL
Anion gap: 14 (ref 5–15)
BUN: 19 mg/dL (ref 6–20)
CHLORIDE: 90 mmol/L — AB (ref 101–111)
CO2: 34 mmol/L — AB (ref 22–32)
CREATININE: 0.91 mg/dL (ref 0.44–1.00)
Calcium: 8.8 mg/dL — ABNORMAL LOW (ref 8.9–10.3)
GFR calc Af Amer: >60 mL/min (ref >60)
GFR calc non Af Amer: 56 mL/min — ABNORMAL LOW (ref >60)
Glucose, Bld: 97 mg/dL (ref 65–99)
POTASSIUM: 4 mmol/L (ref 3.5–5.1)
Sodium: 138 mmol/L (ref 135–145)

## 2015-11-05 NOTE — Progress Notes (Signed)
[facility] PROGRESS NOTE    Progress Note   Kristine Simmons [id number] DOB: [birth year] DOA: 11/02/2015 PCP: Unice Cobble, MD   Brief Narrative:   Kristine Simmons is an 81 y.o. female historical history of chronic atrial fibrillation on this chronic respiratory failure secondary to COPD and chronic aspiration or to his primary care doctor was found to be weak and hypoxic came to the ER requiring initially a nonrebreather, chest x-ray showed bilateral pleural effusion with significant elevated BNP  Assessment/Plan:  Acute on chronic respiratory failure with hypoxia ([place]) likely due to acute diastolic heart failure: - Has positive JVD, echo showed no diastolic heart failure. - Will continue IV Unasyn for 4 days, will follow blood cultures. - Cont IV Lasix, vol overloaded. Good UOP. - Appreciate PMT assistance, now DNR/DNI. - Transfer to telemetry.  Transaminitis: - likely due to HF, repeat hepatic function panel in am. - Hold statins labs pending  COPD: Has remained stable.  Acute kidney injury/mild hyperkalemia/cardiorenal syndrome: Cont to monitor Cr, cont IV diuresis.  Hypothyroidism: - Continue Synthroid.   Hyperlipidemia Continue statins.  Chronic atrial fibrillation ([place]): - Rate controlled continue current meds.  - cont eluquis.  T12 compression fracture (HCC) - Continue brace and narcotics and MiraLAX to prevent constipation.     DVT Prophylaxis - Lovenox ordered.  Family Communication: none Disposition Plan: Home when stable. Code Status:     Code Status Orders        Start     Ordered   11/02/15 2306  Full code   Continuous     11/02/15 2307    Advance Directive Documentation        Most Recent Value   Type of Advance Directive  Healthcare Power of Attorney, Living will   Pre-existing out of facility DNR order (yellow form or pink MOST form)     "MOST" Form in Place?          IV Access:    Peripheral IV   Procedures and  diagnostic studies:   No results found.   Medical Consultants:    None.  Anti-Infectives:   Anti-infectives    Start     Dose/Rate Route Frequency Ordered Stop   11/02/15 2330  Ampicillin-Sulbactam (UNASYN) 3 g in sodium chloride 0.9 % 100 mL IVPB     3 g 100 mL/hr over 60 Minutes Intravenous Every 8 hours 11/02/15 2317        Subjective:    Kristine Simmons  she relates her shortness of breath is improved.  Objective:    Filed Vitals:   11/04/15 1934 11/04/15 2331 11/05/15 0424 11/05/15 0500  BP: 110/62 111/60 99/59   Pulse: 85 76 72   Temp: 97.4 F (36.3 C) 98.6 F (37 C) 97.5 F (36.4 C)   TempSrc: Oral Oral Axillary   Resp: 18 22 27    Height:      Weight:    56.1 kg (123 lb 10.9 oz)  SpO2:  90% 93%     Intake/Output Summary (Last 24 hours) at 11/05/15 0741 Last data filed at 11/05/15 0600  Gross per 24 hour  Intake   1020 ml  Output   2450 ml  Net  -1430 ml   Filed Weights   11/03/15 0440 11/04/15 0500 11/05/15 0500  Weight: 59.7 kg (131 lb 9.8 oz) 57.3 kg (126 lb 5.2 oz) 56.1 kg (123 lb 10.9 oz)    Exam: Gen:  NAD Cardiovascular:  RRR,+JVD Chest and lungs:  good air movement with crackles. Abdomen:  Abdomen soft, NT/ND, + BS Extremities:  No C/E/C   Data Reviewed:    Labs: Basic Metabolic Panel:  Recent Labs Lab 11/02/15 1617 11/03/15 0005 11/03/15 0320 11/04/15 0402 11/05/15 0430  NA 134* 137 138 137 138  K 5.6* 3.7 3.6 2.9* 4.0  CL 94* 93* 95* 85* 90*  CO2 32 34* 31 38* 34*  GLUCOSE 134* 93 85 105* 97  BUN 31* 27* 25* 14 19  CREATININE 1.13* 1.08* 0.99 0.96 0.91  CALCIUM 9.3 8.9 8.5* 8.3* 8.8*   GFR Estimated Creatinine Clearance: 39.3 mL/min (by C-G formula based on Cr of 0.91). Liver Function Tests:  Recent Labs Lab 11/02/15 1617 11/03/15 0320 11/03/15 1005  AST 292* 234* 239*  ALT 319* 275* 283*  ALKPHOS 61 54 60  BILITOT 1.2 1.2 1.2  PROT 6.1* 5.5* 5.7*  ALBUMIN 3.4* 3.4* 3.4*   No results for input(s):  LIPASE, AMYLASE in the last 168 hours.  Recent Labs Lab 11/03/15 0846  AMMONIA 16   Coagulation profile  Recent Labs Lab 11/03/15 0846  INR 1.47    CBC:  Recent Labs Lab 10/30/15 1327 11/02/15 1617 11/03/15 0320  WBC 7.3 9.4 9.7  NEUTROABS 5.2 7.3  --   HGB 14.7 13.3 13.1  HCT 45.2 42.6 39.4  MCV 88.5 90.4 86.8  PLT 186 199 156   Cardiac Enzymes:  Recent Labs Lab 11/02/15 1617 11/03/15 0005 11/03/15 0700  TROPONINI 0.04* 0.03 0.03   BNP (last 3 results)  Recent Labs  11/18/14 0341  PROBNP 6363.0*   CBG:  Recent Labs Lab 11/04/15 0657 11/04/15 1225 11/04/15 1717 11/04/15 2331 11/05/15 0651  GLUCAP 100* 112* 114* 146* 109*   D-Dimer:  Recent Labs  11/03/15 0005  DDIMER 6.56*   Hgb A1c: No results for input(s): HGBA1C in the last 72 hours. Lipid Profile: No results for input(s): CHOL, HDL, LDLCALC, TRIG, CHOLHDL, LDLDIRECT in the last 72 hours. Thyroid function studies: No results for input(s): TSH, T4TOTAL, T3FREE, THYROIDAB in the last 72 hours.  Invalid input(s): FREET3 Anemia work up: No results for input(s): VITAMINB12, FOLATE, FERRITIN, TIBC, IRON, RETICCTPCT in the last 72 hours. Sepsis Labs:  Recent Labs Lab 10/30/15 1327 11/02/15 1617 11/03/15 0005 11/03/15 0025 11/03/15 0320 11/04/15 0402  PROCALCITON  --   --  <0.10  --   --  <0.10  WBC 7.3 9.4  --   --  9.7  --   LATICACIDVEN  --   --   --  1.3  --   --    Microbiology Recent Results (from the past 240 hour(s))  MRSA PCR Screening     Status: None   Collection Time: 11/02/15 10:57 PM  Result Value Ref Range Status   MRSA by PCR NEGATIVE NEGATIVE Final    Comment:        The GeneXpert MRSA Assay (FDA approved for NASAL specimens only), is one component of a comprehensive MRSA colonization surveillance program. It is not intended to diagnose MRSA infection nor to guide or monitor treatment for MRSA infections.   Culture, blood (routine x 2)     Status: None  (Preliminary result)   Collection Time: 11/03/15 12:05 AM  Result Value Ref Range Status   Specimen Description BLOOD RIGHT ARM  Final   Special Requests BOTTLES DRAWN AEROBIC ONLY 10CC  Final   Culture NO GROWTH 1 DAY  Final   Report Status PENDING  Incomplete  Culture, blood (  routine x 2)     Status: None (Preliminary result)   Collection Time: 11/03/15 12:15 AM  Result Value Ref Range Status   Specimen Description BLOOD RIGHT HAND  Final   Special Requests BOTTLES DRAWN AEROBIC ONLY 8CC  Final   Culture NO GROWTH 1 DAY  Final   Report Status PENDING  Incomplete     Medications:   . ampicillin-sulbactam (UNASYN) IV  3 g Intravenous Q8H  . antiseptic oral rinse  7 mL Mouth Rinse BID  . apixaban  2.5 mg Oral BID  . apraclonidine  1 drop Right Eye Q12H  . arformoterol  15 mcg Nebulization BID  . budesonide  0.5 mg Nebulization BID  . budesonide-formoterol  2 puff Inhalation BID  . digoxin  125 mcg Oral Daily  . diltiazem  120 mg Oral Daily  . famotidine  10 mg Oral Daily  . furosemide  40 mg Intravenous 3 times per day  . latanoprost  1 drop Both Eyes QHS  . levothyroxine  50 mcg Oral Once per day on Sun Mon Tue Thu Fri Sat  . [START ON 11/08/2015] levothyroxine  75 mcg Oral Once per day on Wed  . magnesium oxide  400 mg Oral QHS  . polyethylene glycol  17 g Oral Daily  . sertraline  25 mg Oral Daily  . sodium chloride  1 drop Left Eye TID  . timolol  1 drop Both Eyes BID   Continuous Infusions:   Time spent: 25 min   LOS: 3 days   Charlynne Cousins  [facility] Pager [phone]  *Please refer to [email or web address], password [email or web address] to get updated schedule on who will round on this patient, as hospitalists switch teams weekly. If 7PM-7AM, please contact night-coverage at [email or web address], password [email or web address] for any overnight needs.  11/05/2015, 7:41 AM

## 2015-11-06 DIAGNOSIS — I1310 Hypertensive heart and chronic kidney disease without heart failure, with stage 1 through stage 4 chronic kidney disease, or unspecified chronic kidney disease: Secondary | ICD-10-CM

## 2015-11-06 LAB — GLUCOSE, CAPILLARY
GLUCOSE-CAPILLARY: 114 mg/dL — AB (ref 65–99)
GLUCOSE-CAPILLARY: 117 mg/dL — AB (ref 65–99)

## 2015-11-06 LAB — BASIC METABOLIC PANEL
ANION GAP: 13 (ref 5–15)
BUN: 23 mg/dL — ABNORMAL HIGH (ref 6–20)
CHLORIDE: 87 mmol/L — AB (ref 101–111)
CO2: 38 mmol/L — AB (ref 22–32)
CREATININE: 1.02 mg/dL — AB (ref 0.44–1.00)
Calcium: 9.2 mg/dL (ref 8.9–10.3)
GFR calc non Af Amer: 48 mL/min — ABNORMAL LOW (ref >60)
GFR, EST AFRICAN AMERICAN: 56 mL/min — AB (ref >60)
Glucose, Bld: 115 mg/dL — ABNORMAL HIGH (ref 65–99)
POTASSIUM: 3.3 mmol/L — AB (ref 3.5–5.1)
SODIUM: 138 mmol/L (ref 135–145)

## 2015-11-06 LAB — PROCALCITONIN: Procalcitonin: <0.1 ng/mL

## 2015-11-06 MED ORDER — POTASSIUM CHLORIDE CRYS ER 20 MEQ PO TBCR
40.0000 meq | EXTENDED_RELEASE_TABLET | Freq: Two times a day (BID) | ORAL | Status: AC
  Administered 2015-11-06 (×2): 40 meq via ORAL
  Filled 2015-11-06 (×2): qty 2

## 2015-11-06 MED ORDER — FUROSEMIDE 20 MG PO TABS
20.0000 mg | ORAL_TABLET | Freq: Every day | ORAL | Status: DC
  Administered 2015-11-07: 20 mg via ORAL
  Filled 2015-11-06: qty 1

## 2015-11-06 MED ORDER — AMOXICILLIN-POT CLAVULANATE 875-125 MG PO TABS
1.0000 | ORAL_TABLET | Freq: Two times a day (BID) | ORAL | Status: DC
  Administered 2015-11-06 – 2015-11-08 (×5): 1 via ORAL
  Filled 2015-11-06 (×6): qty 1

## 2015-11-06 NOTE — Progress Notes (Addendum)
[facility] PROGRESS NOTE    Progress Note   Kristine Simmons:[id number] DOB: 1927-07-13 DOA: 11/02/2015 PCP: Unice Cobble, MD   Brief Narrative:   Kristine Simmons is an 81 y.o. female historical history of chronic atrial fibrillation on this chronic respiratory failure secondary to COPD and chronic aspiration or to his primary care doctor was found to be weak and hypoxic came to the ER requiring initially a nonrebreather, chest x-ray showed bilateral pleural effusion with significant elevated BNP  Assessment/Plan:  Acute on chronic respiratory failure with hypoxia ([place]) likely due to acute diastolic heart failure: - Euvolemic, echo showed no diastolic heart failure. Replete electrolytes - change Unasyn to augmentin - change lasix to orals. And recheck a basic metabolic panel in the morning. - Appreciate PMT assistance, now DNR/DNI. - Transfer to telemetry. - She is on a dysphagia 2 diet. - Awaiting physical therapy evaluation.  Transaminitis: - likely due to HF, repeat hepatic function panel in am. - repeat c-met in am  COPD: Has remained stable.  Acute kidney injury/mild hyperkalemia/cardiorenal syndrome: Mild increase in Cr. Hold lasix. Replete Potassium and recheck in am  Hypothyroidism: - Continue Synthroid.   Hyperlipidemia Hold statins.  Chronic atrial fibrillation ([place]): - Rate controlled continue current meds.  - cont eluquis.  T12 compression fracture (HCC) - Continue brace and narcotics and MiraLAX to prevent constipation.     DVT Prophylaxis - Lovenox ordered.  Family Communication: none Disposition Plan: Home when stable. Code Status:     Code Status Orders        Start     Ordered   11/02/15 2306  Full code   Continuous     11/02/15 2307    Advance Directive Documentation        Most Recent Value   Type of Advance Directive  Healthcare Power of Attorney, Living will   Pre-existing out of facility DNR order (yellow form or  pink MOST form)     "MOST" Form in Place?          IV Access:    Peripheral IV   Procedures and diagnostic studies:   No results found.   Medical Consultants:    None.  Anti-Infectives:   Anti-infectives    Start     Dose/Rate Route Frequency Ordered Stop   11/02/15 2330  Ampicillin-Sulbactam (UNASYN) 3 g in sodium chloride 0.9 % 100 mL IVPB     3 g 100 mL/hr over 60 Minutes Intravenous Every 8 hours 11/02/15 2317        Subjective:    [name]  she relates she feels close to baseline.  Objective:    Filed Vitals:   11/05/15 1600 11/05/15 2000 11/05/15 2049 11/05/15 2358  BP: 116/75 95/76  117/59  Pulse: 67 73  65  Temp: 97.6 F (36.4 C) 98.1 F (36.7 C)  98 F (36.7 C)  TempSrc: Oral Oral  Axillary  Resp: 19 21  19   Height:      Weight:      SpO2: 97% 93% 90% 96%    Intake/Output Summary (Last 24 hours) at 11/06/15 0742 Last data filed at 11/06/15 0609  Gross per 24 hour  Intake   1150 ml  Output   1520 ml  Net   -370 ml   Filed Weights   11/03/15 0440 11/04/15 0500 11/05/15 0500  Weight: 59.7 kg (131 lb 9.8 oz) 57.3 kg (126 lb 5.2 oz) 56.1 kg (123 lb 10.9 oz)  Exam: Gen:  NAD Cardiovascular:  RRR,-JVD Chest and lungs:   good air movement clear Abdomen:  Abdomen soft, NT/ND, + BS Extremities:  No C/E/C   Data Reviewed:    Labs: Basic Metabolic Panel:  Recent Labs Lab 11/03/15 0005 11/03/15 0320 11/04/15 0402 11/05/15 0430 11/06/15 0515  NA 137 138 137 138 138  K 3.7 3.6 2.9* 4.0 3.3*  CL 93* 95* 85* 90* 87*  CO2 34* 31 38* 34* 38*  GLUCOSE 93 85 105* 97 115*  BUN 27* 25* 14 19 23*  CREATININE 1.08* 0.99 0.96 0.91 1.02*  CALCIUM 8.9 8.5* 8.3* 8.8* 9.2   GFR Estimated Creatinine Clearance: 35.1 mL/min (by C-G formula based on Cr of 1.02). Liver Function Tests:  Recent Labs Lab 11/02/15 1617 11/03/15 0320 11/03/15 1005  AST 292* 234* 239*  ALT 319* 275* 283*  ALKPHOS 61 54 60  BILITOT 1.2 1.2 1.2    PROT 6.1* 5.5* 5.7*  ALBUMIN 3.4* 3.4* 3.4*   No results for input(s): LIPASE, AMYLASE in the last 168 hours.  Recent Labs Lab 11/03/15 0846  AMMONIA 16   Coagulation profile  Recent Labs Lab 11/03/15 0846  INR 1.47    CBC:  Recent Labs Lab 10/30/15 1327 11/02/15 1617 11/03/15 0320  WBC 7.3 9.4 9.7  NEUTROABS 5.2 7.3  --   HGB 14.7 13.3 13.1  HCT 45.2 42.6 39.4  MCV 88.5 90.4 86.8  PLT 186 199 156   Cardiac Enzymes:  Recent Labs Lab 11/02/15 1617 11/03/15 0005 11/03/15 0700  TROPONINI 0.04* 0.03 0.03   BNP (last 3 results)  Recent Labs  11/18/14 0341  PROBNP 6363.0*   CBG:  Recent Labs Lab 11/04/15 2331 11/05/15 0651 11/05/15 1209 11/05/15 1603 11/06/15 0532  GLUCAP 146* 109* 115* 141* 114*   D-Dimer: No results for input(s): DDIMER in the last 72 hours. Hgb A1c: No results for input(s): HGBA1C in the last 72 hours. Lipid Profile: No results for input(s): CHOL, HDL, LDLCALC, TRIG, CHOLHDL, LDLDIRECT in the last 72 hours. Thyroid function studies: No results for input(s): TSH, T4TOTAL, T3FREE, THYROIDAB in the last 72 hours.  Invalid input(s): FREET3 Anemia work up: No results for input(s): VITAMINB12, FOLATE, FERRITIN, TIBC, IRON, RETICCTPCT in the last 72 hours. Sepsis Labs:  Recent Labs Lab 10/30/15 1327 11/02/15 1617 11/03/15 0005 11/03/15 0025 11/03/15 0320 11/04/15 0402  PROCALCITON  --   --  <0.10  --   --  <0.10  WBC 7.3 9.4  --   --  9.7  --   LATICACIDVEN  --   --   --  1.3  --   --    Microbiology Recent Results (from the past 240 hour(s))  MRSA PCR Screening     Status: None   Collection Time: 11/02/15 10:57 PM  Result Value Ref Range Status   MRSA by PCR NEGATIVE NEGATIVE Final    Comment:        The GeneXpert MRSA Assay (FDA approved for NASAL specimens only), is one component of a comprehensive MRSA colonization surveillance program. It is not intended to diagnose MRSA infection nor to guide or monitor  treatment for MRSA infections.   Culture, blood (routine x 2)     Status: None (Preliminary result)   Collection Time: 11/03/15 12:05 AM  Result Value Ref Range Status   Specimen Description BLOOD RIGHT ARM  Final   Special Requests BOTTLES DRAWN AEROBIC ONLY 10CC  Final   Culture NO GROWTH 2 DAYS  Final   Report Status PENDING  Incomplete  Culture, blood (routine x 2)     Status: None (Preliminary result)   Collection Time: 11/03/15 12:15 AM  Result Value Ref Range Status   Specimen Description BLOOD RIGHT HAND  Final   Special Requests BOTTLES DRAWN AEROBIC ONLY 8CC  Final   Culture NO GROWTH 2 DAYS  Final   Report Status PENDING  Incomplete     Medications:   . ampicillin-sulbactam (UNASYN) IV  3 g Intravenous Q8H  . antiseptic oral rinse  7 mL Mouth Rinse BID  . apixaban  2.5 mg Oral BID  . apraclonidine  1 drop Right Eye Q12H  . arformoterol  15 mcg Nebulization BID  . budesonide  0.5 mg Nebulization BID  . budesonide-formoterol  2 puff Inhalation BID  . digoxin  125 mcg Oral Daily  . diltiazem  120 mg Oral Daily  . famotidine  10 mg Oral Daily  . furosemide  40 mg Intravenous 3 times per day  . latanoprost  1 drop Both Eyes QHS  . levothyroxine  50 mcg Oral Once per day on Sun Mon Tue Thu Fri Sat  . [START ON 11/08/2015] levothyroxine  75 mcg Oral Once per day on Wed  . magnesium oxide  400 mg Oral QHS  . polyethylene glycol  17 g Oral Daily  . sertraline  25 mg Oral Daily  . sodium chloride  1 drop Left Eye TID  . timolol  1 drop Both Eyes BID   Continuous Infusions:   Time spent: 25 min   LOS: 4 days   Charlynne Cousins  [facility] Pager [phone]  *Please refer to [email or web address], password [email or web address] to get updated schedule on who will round on this patient, as hospitalists switch teams weekly. If 7PM-7AM, please contact night-coverage at [email or web address], password [email or web address] for any overnight needs.  11/06/2015, 7:42 AM

## 2015-11-06 NOTE — Progress Notes (Signed)
Speech Language Pathology Treatment: Dysphagia  Patient Details Name: Kristine Simmons MRN: [id number] DOB: Dec 06, 1927 Today's Date: 11/06/2015 Time: [id number] SLP Time Calculation (min) (ACUTE ONLY): 13 min  Assessment / Plan / Recommendation Clinical Impression  Dysphagia intervention with pt now on nasal cannula. Mildly prolonged mastication, min verbal cues to use liquid as rinse to clear oral cavity. No increased work of breathing. No pharyngeal difficulty. Recommend upgrade to Dys 3, continue thin with strategies, esophageal precautions and continue ST.     HPI HPI: 81 yo female with PMH of COPD, CVA, who was admitted for hypoxia. Pt has had multiple swallow evaluations in the past, all of which have recommended regular diets and thin liquids with concern for a primary esophageal component.       SLP Plan  Continue with current plan of care     Recommendations  Diet recommendations: Dysphagia 3 (mechanical soft);Thin liquid Liquids provided via: Cup;No straw Medication Administration: Whole meds with puree Supervision: Patient able to self feed;Intermittent supervision to cue for compensatory strategies Compensations: Slow rate;Small sips/bites Postural Changes and/or Swallow Maneuvers: Seated upright 90 degrees;Upright 30-60 min after meal              Oral Care Recommendations: Oral care BID Follow up Recommendations: None Plan: Continue with current plan of care   Houston Siren 11/06/2015, 8:25 AM  Orbie Pyo Colvin Caroli[employer] [phone]

## 2015-11-06 NOTE — Care Management Important Message (Signed)
Important Message  Patient Details  Name: KLAIR URSERY MRN: [id number] Date of Birth: 1927/06/06   Medicare Important Message Given:  Yes    Avaree Gilberti P Beauden Tremont 11/06/2015, 1:23 PM

## 2015-11-06 NOTE — Progress Notes (Signed)
Patient transferring to [phone]. Report called to receiving nurse [facility] RN. Husband at bedside.

## 2015-11-07 ENCOUNTER — Inpatient Hospital Stay (HOSPITAL_COMMUNITY): Payer: Medicare Other

## 2015-11-07 ENCOUNTER — Encounter: Payer: Self-pay | Admitting: Speech Pathology

## 2015-11-07 LAB — COMPREHENSIVE METABOLIC PANEL
ALBUMIN: 3.2 g/dL — AB (ref 3.5–5.0)
ALT: 90 U/L — ABNORMAL HIGH (ref 14–54)
AST: 45 U/L — AB (ref 15–41)
Alkaline Phosphatase: 82 U/L (ref 38–126)
Anion gap: 9 (ref 5–15)
BUN: 22 mg/dL — AB (ref 6–20)
CHLORIDE: 89 mmol/L — AB (ref 101–111)
CO2: 37 mmol/L — ABNORMAL HIGH (ref 22–32)
Calcium: 9.4 mg/dL (ref 8.9–10.3)
Creatinine, Ser: 0.86 mg/dL (ref 0.44–1.00)
GFR calc Af Amer: >60 mL/min (ref >60)
GFR calc non Af Amer: 59 mL/min — ABNORMAL LOW (ref >60)
GLUCOSE: 103 mg/dL — AB (ref 65–99)
POTASSIUM: 4.1 mmol/L (ref 3.5–5.1)
Sodium: 135 mmol/L (ref 135–145)
Total Bilirubin: 0.9 mg/dL (ref 0.3–1.2)
Total Protein: 6 g/dL — ABNORMAL LOW (ref 6.5–8.1)

## 2015-11-07 LAB — GLUCOSE, CAPILLARY
Glucose-Capillary: 115 mg/dL — ABNORMAL HIGH (ref 65–99)
Glucose-Capillary: 106 mg/dL — ABNORMAL HIGH (ref 65–99)

## 2015-11-07 MED ORDER — FUROSEMIDE 40 MG PO TABS
40.0000 mg | ORAL_TABLET | Freq: Every day | ORAL | Status: DC
  Administered 2015-11-08: 40 mg via ORAL
  Filled 2015-11-07: qty 1

## 2015-11-07 NOTE — Progress Notes (Signed)
Speech Language Pathology Treatment: Dysphagia  Patient Details Name: Kristine Simmons MRN: [id number] DOB: Mar 19, 1927 Today's Date: 11/07/2015 Time: 8301-5996 SLP Time Calculation (min) (ACUTE ONLY): 17 min  Assessment / Plan / Recommendation Clinical Impression  Pt seen for dysphagia intervention with husband at bedside. Mastication of Dys 3 texture functional. No cough, throat clear or wet vocal quality present. Reviewed strategies for alternating liquids/solids, remain upright minimum of 30 minutes post meals. Pt is mild-moderate aspiration risk. Continue Dys 3 texture while in hospital upgrading to regular at home when strength and endurance improves. Discharge ST.   HPI HPI: 81 yo female with PMH of COPD, CVA, who was admitted for hypoxia. Pt has had multiple swallow evaluations in the past, all of which have recommended regular diets and thin liquids with concern for a primary esophageal component.       SLP Plan  All goals met;Discharge SLP treatment due to (comment)     Recommendations  Diet recommendations: Dysphagia 3 (mechanical soft);Thin liquid Liquids provided via: Cup;No straw Medication Administration: Whole meds with puree Supervision: Patient able to self feed Compensations: Slow rate;Small sips/bites Postural Changes and/or Swallow Maneuvers: Seated upright 90 degrees;Upright 30-60 min after meal              Oral Care Recommendations: Oral care BID Follow up Recommendations: None Plan: All goals met;Discharge SLP treatment due to (comment)   Houston Siren 11/07/2015, 11:28 AM  Orbie Pyo Colvin [employer] [phone]

## 2015-11-07 NOTE — Therapy (Signed)
[facility] [address] [place], [place], [address] Phone: [phone]   Fax:  [phone]  Patient Details  Name: Kristine Simmons MRN: [id number] Date of Birth: Jun 07, 1927 Referring Provider:  Dr. Percival Spanish  Encounter Date: 11/07/2015   SPEECH THERAPY DISCHARGE SUMMARY  Visits from Start of Care: 1  Current functional level related to goals / functional outcomes: Short term and long term goals not met due to pt not returning to speech therapy after initial evaluation   Remaining deficits: Oropharyngeal dysphagia   Education / Equipment: Swallow precautions; HEP for dysphagia Plan: Patient agrees to discharge.  Patient goals were not met. Patient is being discharged due to not returning since the last visit.  ?????        Reubin Bushnell, Annye Rusk MS, CCC-SLP 11/07/2015, 2:22 PM  [facility] [address] [place], [place], [address] Phone: [phone]   Fax:  [phone]

## 2015-11-07 NOTE — Evaluation (Signed)
Physical Therapy Evaluation Patient Details Name: Kristine Simmons MRN: [id number] DOB: [birth year] Today's Date: 11/07/2015   History of Present Illness  Patient is a 81 y/o female with hx of A-fib, CVA, blindness, chronic respiratory failure secondary to COPD and chronic aspiration, hypothyroidism, who has had recent T12 compression fracture and was noted to have weakness and hypoxia at PCP and transferred to ED. In the ER pt had required initially nonrebreather and chest x-ray shows bilateral pleural effusion with significant elevated BNP levels  Clinical Impression  Patient presents with generalized weakness, DOE, premorbid visual deficits, impaired endurance s/p above impacting mobility. Sp02 decreased duing mobility however not sure it was an accurate reading due to cold fingers. Pt with multiple falls at home. Explained back precautions and conditions with TLSO. Tolerated ambulation with Min A for balance/safety. Would benefit from ST SNF to maximize independence and mobility and minimize fall risk prior to return home.     Follow Up Recommendations SNF    Equipment Recommendations  None recommended by PT    Recommendations for Other Services       Precautions / Restrictions Precautions Precautions: Back Precaution Comments: monitor 02 Required Braces or Orthoses: Spinal Brace Spinal Brace: Thoracolumbosacral orthotic (only donned when OOB.) Restrictions Weight Bearing Restrictions: No      Mobility  Bed Mobility               General bed mobility comments: Sitting on BSC upon PT arrival.   Transfers Overall transfer level: Needs assistance Equipment used: 4-wheeled walker Transfers: Sit to/from Stand Sit to Stand: Min guard         General transfer comment: Min guard for safety.   Ambulation/Gait Ambulation/Gait assistance: Min assist Ambulation Distance (Feet): 125 Feet Assistive device: 4-wheeled walker Gait Pattern/deviations: Step-through  pattern;Decreased stride length;Trunk flexed;Drifts right/left;Staggering right;Staggering left Gait velocity: decreased   General Gait Details: Drifting noted to left/right and some staggering noted as well. Directional cues due to visual deficits. 1 seated rest break. DOE. Sp02 difficult to read, read ~75% on 4L/min 02.  Stairs            Wheelchair Mobility    Modified Rankin (Stroke Patients Only)       Balance Overall balance assessment: Needs assistance Sitting-balance support: Feet supported;No upper extremity supported Sitting balance-Leahy Scale: Good     Standing balance support: During functional activity Standing balance-Leahy Scale: Fair                               Pertinent Vitals/Pain Pain Assessment: No/denies pain    Home Living Family/patient expects to be discharged to:: Skilled nursing facility Living Arrangements: Spouse/significant other Available Help at Discharge: Family Type of Home: House Home Access: Stairs to enter Entrance Stairs-Rails: None Entrance Stairs-Number of Steps: 1 + 1 Home Layout: One level Home Equipment: Walker - 2 wheels;Cane - single point;Walker - 4 wheels      Prior Function Level of Independence: Needs assistance   Gait / Transfers Assistance Needed: holds onto objects and/or spouse's arm. Uses SPC at times.   ADL's / Homemaking Assistance Needed: spouse assists due to vision  Comments: Multiple falls at home     Hand Dominance        Extremity/Trunk Assessment   Upper Extremity Assessment: Defer to OT evaluation           Lower Extremity Assessment: Generalized weakness  Communication   Communication: No difficulties  Cognition Arousal/Alertness: Awake/alert Behavior During Therapy: WFL for tasks assessed/performed Overall Cognitive Status: Within Functional Limits for tasks assessed                      General Comments General comments (skin integrity, edema,  etc.): Lengthy discussion with pt and spouse and investigation into chair about when to wear TLSO as pt's understanding to wear brace 24/7. Finally RN got in touch with MD and clarified order to only have brace donned with OOB.    Exercises        Assessment/Plan    PT Assessment Patient needs continued PT services  PT Diagnosis Difficulty walking;Generalized weakness   PT Problem List Decreased strength;Cardiopulmonary status limiting activity;Decreased activity tolerance;Decreased balance;Decreased mobility  PT Treatment Interventions Balance training;Gait training;Functional mobility training;Therapeutic activities;Therapeutic exercise;Patient/family education   PT Goals (Current goals can be found in the Care Plan section) Acute Rehab PT Goals Patient Stated Goal: to go to rehab PT Goal Formulation: With patient/family Time For Goal Achievement: 11/21/15 Potential to Achieve Goals: Good    Frequency Min 2X/week   Barriers to discharge Decreased caregiver support Not sure how much the spouse can truly assist.     Co-evaluation               End of Session Equipment Utilized During Treatment: Gait belt;Back brace;Oxygen Activity Tolerance: Patient tolerated treatment well;Treatment limited secondary to medical complications (Comment) (possible decrease in Sp02) Patient left: in bed;with call bell/phone within reach;with family/visitor present;with nursing/sitter in room Nurse Communication: Mobility status         Time: 1522-1600 PT Time Calculation (min) (ACUTE ONLY): 38 min   Charges:   PT Evaluation $Initial PT Evaluation Tier I: 1 Procedure PT Treatments $Gait Training: 8-22 mins   PT G Codes:        Jadore Mcguffin A Cierah Crader 11/07/2015, 4:13 PM  Wray Kearns, [place], DPT [phone]

## 2015-11-07 NOTE — Care Management Note (Signed)
Case Management Note  Patient Details  Name: Kristine Simmons MRN: [id number] Date of Birth: 04-Dec-1927  Subjective/Objective:       Date: 11/07/15 Spoke with patient at the bedside along with daughter , Uvaldo Bristle [id number] and patient's spouse.  Introduced self as Tourist information centre manager and explained role in discharge planning and how to be reached.  Verified patient lives in town, with spouse,  Has DME rolling walker and bsc and home oxygen through [facility] , 4 liters at home.  Expressed potential need for no other DME, daughter Santiago Glad is concerned that patient will need help taking her medications at home and will need her oxygen sats checked.  NCM explained that pt/ot eval has been ordered for patient and we will see how patient is able to mobilize, they may rec [facility] services or SNF but it will be up to the patient of what she wants to do.  Patient states if recommends hh services she would like [place], she had a physical therapist name Catalina Antigua that she really liked.  NCM informed her that we will see what recs are and go from there.    Verified patient anticipates to go home with family, at time of discharge and will have part-time supervision by spouse who is elderly. Children do not live in town .  Patient denied needing help with affording their medication.  Patient is driven by daughter, Santiago Glad to MD appointments.  Verified patient has PCP [facility].  Await pt/ot eval.  Plan: CM will continue to follow for discharge planning and [facility] resources.              Action/Plan:   Expected Discharge Date:                  Expected Discharge Plan:  [place]  In-House Referral:     Discharge planning Services  CM Consult  Post Acute Care Choice:    Choice offered to:     DME Arranged:    DME Agency:     HH Arranged:    [place] Agency:     Status of Service:  In process, will continue to follow  Medicare Important Message Given:  Yes Date Medicare IM Given:    Medicare IM give by:     Date Additional Medicare IM Given:    Additional Medicare Important Message give by:     If discussed at [place] of Stay Meetings, dates discussed:    Additional Comments:  Zenon Mayo, RN 11/07/2015, 12:20 PM

## 2015-11-07 NOTE — Progress Notes (Addendum)
[facility] PROGRESS NOTE    Progress Note   LUCINDY KOENEMAN [id number] DOB: 1926-12-30 DOA: 11/02/2015 PCP: Unice Cobble, MD   Brief Narrative:   Kristine Simmons is an 81 y.o. female historical history of chronic atrial fibrillation on this chronic respiratory failure secondary to COPD and chronic aspiration or to his primary care doctor was found to be weak and hypoxic came to the ER requiring initially a nonrebreather, chest x-ray showed bilateral pleural effusion with significant elevated BNP, has elevated LFTs likely due to heart failure. Has diureses well checking a chest x-ray on oral Lasix awaiting physical therapy evaluation.  Assessment/Plan:  Acute on chronic respiratory failure with hypoxia (HCC) likely due to acute diastolic heart failure and aspiration PNA: - Euvolemic, echo showed no diastolic heart failure, mod Ao. Stenosis. - Now on oral lasix and augmentin, cr is improved. - Appreciate PMT assistance, now DNR/DNI. - She is on a dysphagia 2 diet. - Awaiting physical therapy evaluation.  Transaminitis: - likely due to HF, repeat hepatic function panel in am. - Improved with diuresis.  COPD: Has remained stable.  Acute kidney injury/mild hypokalemia/cardiorenal syndrome: Cr back to [place], hypokalemia improved.  Hypothyroidism: - Continue Synthroid.   Hyperlipidemia Hold statins.  Chronic atrial fibrillation ([place]): - Rate controlled continue current meds.  - cont eluquis.  T12 compression fracture (HCC) - Continue brace and narcotics and MiraLAX to prevent constipation.   Mild Aortic stenosis:     DVT Prophylaxis - Lovenox ordered.  Family Communication: none Disposition Plan: Home when stable. Code Status:     Code Status Orders        Start     Ordered   11/02/15 2306  Full code   Continuous     11/02/15 2307    Advance Directive Documentation        Most Recent Value   Type of Advance Directive  Healthcare Power of  Attorney, Living will   Pre-existing out of facility DNR order (yellow form or pink MOST form)     "MOST" Form in Place?          IV Access:    Peripheral IV   Procedures and diagnostic studies:   No results found.   Medical Consultants:    None.  Anti-Infectives:   Anti-infectives    Start     Dose/Rate Route Frequency Ordered Stop   11/06/15 1000  amoxicillin-clavulanate (AUGMENTIN) 875-125 MG per tablet 1 tablet     1 tablet Oral Every 12 hours 11/06/15 0747     11/02/15 2330  Ampicillin-Sulbactam (UNASYN) 3 g in sodium chloride 0.9 % 100 mL IVPB  Status:  Discontinued     3 g 100 mL/hr over 60 Minutes Intravenous Every 8 hours 11/02/15 2317 11/06/15 0747      Subjective:    Wandra Arthurs [place]  No complains  Objective:    Filed Vitals:   11/07/15 0550 11/07/15 0930 11/07/15 1002 11/07/15 1007  BP: 123/52 104/57    Pulse: 63 62    Temp: 98 F (36.7 C)     TempSrc:      Resp: 20     Height:      Weight:      SpO2: 98%  96% 97%    Intake/Output Summary (Last 24 hours) at 11/07/15 1403 Last data filed at 11/07/15 1025  Gross per 24 hour  Intake    240 ml  Output    700 ml  Net   -460 ml  Filed Weights   11/04/15 0500 11/05/15 0500 11/06/15 1018  Weight: 57.3 kg (126 lb 5.2 oz) 56.1 kg (123 lb 10.9 oz) 55.8 kg (123 lb 0.3 oz)    Exam: Gen:  NAD Cardiovascular:  RRR,-JVD Chest and lungs:   good air movement clear Abdomen:  Abdomen soft, NT/ND, + BS Extremities:  No C/E/C   Data Reviewed:    Labs: Basic Metabolic Panel:  Recent Labs Lab 11/03/15 0320 11/04/15 0402 11/05/15 0430 11/06/15 0515 11/07/15 0708  NA 138 137 138 138 135  K 3.6 2.9* 4.0 3.3* 4.1  CL 95* 85* 90* 87* 89*  CO2 31 38* 34* 38* 37*  GLUCOSE 85 105* 97 115* 103*  BUN 25* 14 19 23* 22*  CREATININE 0.99 0.96 0.91 1.02* 0.86  CALCIUM 8.5* 8.3* 8.8* 9.2 9.4   GFR Estimated Creatinine Clearance: 41.4 mL/min (by C-G formula based on Cr of 0.86). Liver Function  Tests:  Recent Labs Lab 11/02/15 1617 11/03/15 0320 11/03/15 1005 11/07/15 0708  AST 292* 234* 239* 45*  ALT 319* 275* 283* 90*  ALKPHOS 61 54 60 82  BILITOT 1.2 1.2 1.2 0.9  PROT 6.1* 5.5* 5.7* 6.0*  ALBUMIN 3.4* 3.4* 3.4* 3.2*   No results for input(s): LIPASE, AMYLASE in the last 168 hours.  Recent Labs Lab 11/03/15 0846  AMMONIA 16   Coagulation profile  Recent Labs Lab 11/03/15 0846  INR 1.47    CBC:  Recent Labs Lab 11/02/15 1617 11/03/15 0320  WBC 9.4 9.7  NEUTROABS 7.3  --   HGB 13.3 13.1  HCT 42.6 39.4  MCV 90.4 86.8  PLT 199 156   Cardiac Enzymes:  Recent Labs Lab 11/02/15 1617 11/03/15 0005 11/03/15 0700  TROPONINI 0.04* 0.03 0.03   BNP (last 3 results)  Recent Labs  11/18/14 0341  PROBNP 6363.0*   CBG:  Recent Labs Lab 11/05/15 1603 11/06/15 0532 11/06/15 1234 11/07/15 0753 11/07/15 1226  GLUCAP 141* 114* 117* 115* 106*   D-Dimer: No results for input(s): DDIMER in the last 72 hours. Hgb A1c: No results for input(s): HGBA1C in the last 72 hours. Lipid Profile: No results for input(s): CHOL, HDL, LDLCALC, TRIG, CHOLHDL, LDLDIRECT in the last 72 hours. Thyroid function studies: No results for input(s): TSH, T4TOTAL, T3FREE, THYROIDAB in the last 72 hours.  Invalid input(s): FREET3 Anemia work up: No results for input(s): VITAMINB12, FOLATE, FERRITIN, TIBC, IRON, RETICCTPCT in the last 72 hours. Sepsis Labs:  Recent Labs Lab 11/02/15 1617 11/03/15 0005 11/03/15 0025 11/03/15 0320 11/04/15 0402 11/06/15 0515  PROCALCITON  --  <0.10  --   --  <0.10 <0.10  WBC 9.4  --   --  9.7  --   --   LATICACIDVEN  --   --  1.3  --   --   --    Microbiology Recent Results (from the past 240 hour(s))  MRSA PCR Screening     Status: None   Collection Time: 11/02/15 10:57 PM  Result Value Ref Range Status   MRSA by PCR NEGATIVE NEGATIVE Final    Comment:        The GeneXpert MRSA Assay (FDA approved for NASAL  specimens only), is one component of a comprehensive MRSA colonization surveillance program. It is not intended to diagnose MRSA infection nor to guide or monitor treatment for MRSA infections.   Culture, blood (routine x 2)     Status: None (Preliminary result)   Collection Time: 11/03/15 12:05 AM  Result  Value Ref Range Status   Specimen Description BLOOD RIGHT ARM  Final   Special Requests BOTTLES DRAWN AEROBIC ONLY 10CC  Final   Culture NO GROWTH 4 DAYS  Final   Report Status PENDING  Incomplete  Culture, blood (routine x 2)     Status: None (Preliminary result)   Collection Time: 11/03/15 12:15 AM  Result Value Ref Range Status   Specimen Description BLOOD RIGHT HAND  Final   Special Requests BOTTLES DRAWN AEROBIC ONLY 8CC  Final   Culture NO GROWTH 4 DAYS  Final   Report Status PENDING  Incomplete     Medications:   . amoxicillin-clavulanate  1 tablet Oral Q12H  . antiseptic oral rinse  7 mL Mouth Rinse BID  . apixaban  2.5 mg Oral BID  . apraclonidine  1 drop Right Eye Q12H  . arformoterol  15 mcg Nebulization BID  . budesonide  0.5 mg Nebulization BID  . budesonide-formoterol  2 puff Inhalation BID  . digoxin  125 mcg Oral Daily  . diltiazem  120 mg Oral Daily  . famotidine  10 mg Oral Daily  . furosemide  20 mg Oral Daily  . latanoprost  1 drop Both Eyes QHS  . levothyroxine  50 mcg Oral Once per day on Sun Mon Tue Thu Fri Sat  . [START ON 11/08/2015] levothyroxine  75 mcg Oral Once per day on Wed  . magnesium oxide  400 mg Oral QHS  . polyethylene glycol  17 g Oral Daily  . sertraline  25 mg Oral Daily  . sodium chloride  1 drop Left Eye TID  . timolol  1 drop Both Eyes BID   Continuous Infusions:   Time spent: 25 min   LOS: 5 days   Charlynne Cousins  [facility] Pager [phone]  *Please refer to [email or web address], password [email or web address] to get updated schedule on who will round on this patient, as hospitalists switch teams weekly. If 7PM-7AM, please  contact night-coverage at [email or web address], password [email or web address] for any overnight needs.  11/07/2015, 2:03 PM

## 2015-11-08 DIAGNOSIS — M4854XD Collapsed vertebra, not elsewhere classified, thoracic region, subsequent encounter for fracture with routine healing: Secondary | ICD-10-CM

## 2015-11-08 DIAGNOSIS — I130 Hypertensive heart and chronic kidney disease with heart failure and stage 1 through stage 4 chronic kidney disease, or unspecified chronic kidney disease: Secondary | ICD-10-CM

## 2015-11-08 DIAGNOSIS — Z515 Encounter for palliative care: Secondary | ICD-10-CM

## 2015-11-08 LAB — GLUCOSE, CAPILLARY
Glucose-Capillary: 76 mg/dL (ref 65–99)
Glucose-Capillary: 91 mg/dL (ref 65–99)
Glucose-Capillary: 93 mg/dL (ref 65–99)

## 2015-11-08 LAB — COMPREHENSIVE METABOLIC PANEL
ALBUMIN: 3 g/dL — AB (ref 3.5–5.0)
ALT: 70 U/L — AB (ref 14–54)
AST: 37 U/L (ref 15–41)
Alkaline Phosphatase: 83 U/L (ref 38–126)
Anion gap: 10 (ref 5–15)
BILIRUBIN TOTAL: 1 mg/dL (ref 0.3–1.2)
BUN: 19 mg/dL (ref 6–20)
CHLORIDE: 90 mmol/L — AB (ref 101–111)
CO2: 37 mmol/L — ABNORMAL HIGH (ref 22–32)
CREATININE: 0.94 mg/dL (ref 0.44–1.00)
Calcium: 9.3 mg/dL (ref 8.9–10.3)
GFR calc Af Amer: >60 mL/min (ref >60)
GFR, EST NON AFRICAN AMERICAN: 53 mL/min — AB (ref >60)
GLUCOSE: 102 mg/dL — AB (ref 65–99)
POTASSIUM: 3.7 mmol/L (ref 3.5–5.1)
Sodium: 137 mmol/L (ref 135–145)
TOTAL PROTEIN: 5.6 g/dL — AB (ref 6.5–8.1)

## 2015-11-08 LAB — CULTURE, BLOOD (ROUTINE X 2)
CULTURE: NO GROWTH
CULTURE: NO GROWTH

## 2015-11-08 MED ORDER — FUROSEMIDE 40 MG PO TABS: 40.0000 mg | ORAL_TABLET | Freq: Every day | ORAL | Status: DC

## 2015-11-08 MED ORDER — AMOXICILLIN-POT CLAVULANATE 875-125 MG PO TABS: 1.0000 | ORAL_TABLET | Freq: Two times a day (BID) | ORAL | Status: AC

## 2015-11-08 NOTE — [place] FL2 (Signed)
[place] MEDICAID FL2 LEVEL OF CARE SCREENING TOOL     IDENTIFICATION  Patient Name: Kristine Simmons Birthdate: [birth year] Sex: female Admission Date (Current Location): 11/02/2015  [facility] and [place] Number: Herbalist and Address:  The [facility], [place] [address], [place], [place] [address]      Provider Number: [id number]  Attending Physician Name and Address:  Louellen Molder, MD  Relative Name and Phone Number:       Current Level of Care: Hospital Recommended Level of Care: [place] Prior Approval Number:    Date Approved/Denied:   PASRR Number: [id number]  Discharge Plan: SNF    Current Diagnoses: Patient Active Problem List   Diagnosis Date Noted  . Cardiorenal syndrome 11/05/2015  . Palliative care encounter   . Goals of care, counseling/discussion   . Acute diastolic heart failure ([place]) 11/03/2015  . Respiratory distress 11/02/2015  . Acute on chronic respiratory failure with hypoxia ([place]) 11/02/2015  . Chronic atrial fibrillation ([place]) 11/02/2015  . T12 compression fracture ([place]) 11/02/2015  . Acute respiratory failure with hypoxia ([place]) 11/02/2015  . Acute on chronic respiratory failure ([place]) 04/12/2015  . Elevated LFTs 04/12/2015  . Aspiration pneumonia ([place]) 04/09/2015  . Altered mental status   . CAP (community acquired pneumonia) 04/08/2015  . Dysphagia, pharyngoesophageal phase 03/07/2015  . Recurrent inguinal hernia with obstruction 11/11/2014  . Depression 09/22/2014  . Hypoxemia 09/22/2014  . Aortic stenosis, mild 08/15/2014  . Chronic anticoagulation 08/15/2014  . Recurrent femoral hernia with obstruction without gangrene 08/15/2014  . History of CVA (cerebrovascular accident) 08/15/2014  . Squamous cell carcinoma of neck ([place]) 03/09/2014  . Cough 09/16/2012  . Diverticulosis 09/11/2011  . Glaucoma 09/11/2011  . CAROTID STENOSIS 08/08/2009  . HEARING LOSS 11/22/2008  . COPD, Gold grade  A 04/08/2008  . HERNIA 04/08/2008  . ARTHRITIS 04/08/2008  . VITAMIN D DEFICIENCY 02/01/2008  . Atrial fibrillation ([place]) 02/01/2008  . HYPERTENSION, ESSENTIAL NOS 12/01/2007  . OSTEOPOROSIS 12/01/2007  . Hypothyroidism 08/06/2007  . DISEQUILIBRIUM 07/24/2007  . G E R D 06/02/2007  . THYROID NODULE, LEFT 04/10/2007  . Hyperlipidemia 04/10/2007  . COLONIC POLYPS, HX OF 04/10/2007    Orientation ACTIVITIES/SOCIAL BLADDER RESPIRATION    Self, Time, Situation, Place  Family supportive Continent O2 (As needed) (Nasal Cannula 4L)  BEHAVIORAL SYMPTOMS/MOOD NEUROLOGICAL BOWEL NUTRITION STATUS      Continent Diet (Mechanical Soft; See DC summary)  PHYSICIAN VISITS COMMUNICATION OF NEEDS Height & Weight Skin    Verbally 5' 7.5" (171.5 cm) 118 lbs. Normal          AMBULATORY STATUS RESPIRATION    Supervision limited O2 (As needed) (Nasal Cannula 4L)      Personal Care Assistance Level of Assistance  Bathing, Dressing Bathing Assistance: Limited assistance   Dressing Assistance: Limited assistance      Functional Limitations Info  Sight, Hearing Sight Info: Impaired Hearing Info: Impaired         SPECIAL CARE FACTORS FREQUENCY  PT (By licensed PT)     PT Frequency: 5x/week             Additional Factors Info  Code Status, Allergies Code Status Info: DNR Allergies Info: Hydrocodone; Desmopressin Acetate; Dorzolamide Hcl; Prempro; Ciprofloxacin            Current Medications (11/08/2015): Current Facility-Administered Medications  Medication Dose Route Frequency Provider Last Rate Last Dose  . albuterol (PROVENTIL) (2.5 MG/3ML) 0.083% nebulizer solution 2.5 mg  2.5 mg Nebulization Q3H PRN Rahul P Desai, PA-C      . amoxicillin-clavulanate (AUGMENTIN) 875-125 MG per tablet 1 tablet  1 tablet Oral Q12H Charlynne Cousins, MD   1 tablet at 11/08/15 0933  . antiseptic oral rinse (CPC / CETYLPYRIDINIUM CHLORIDE 0.05%) solution 7 mL  7 mL Mouth Rinse BID Charlynne Cousins, MD   7 mL at 11/08/15 0943  . apixaban (ELIQUIS) tablet 2.5 mg  2.5 mg Oral BID Rise Patience, MD   2.5 mg at 11/08/15 0933  . apraclonidine (IOPIDINE) 0.5 % ophthalmic solution 1 drop  1 drop Right Eye Q12H Rise Patience, MD   1 drop at 11/08/15 1030  . arformoterol (BROVANA) nebulizer solution 15 mcg  15 mcg Nebulization BID Rahul P Desai, PA-C   15 mcg at 11/08/15 0911  . budesonide (PULMICORT) nebulizer solution 0.5 mg  0.5 mg Nebulization BID Rahul P Desai, PA-C   0.5 mg at 11/08/15 0910  . budesonide-formoterol (SYMBICORT) 80-4.5 MCG/ACT inhaler 2 puff  2 puff Inhalation BID Charlynne Cousins, MD   2 puff at 11/08/15 0911  . digoxin (LANOXIN) tablet 125 mcg  125 mcg Oral Daily Rise Patience, MD   125 mcg at 11/08/15 0933  . diltiazem (CARDIZEM CD) 24 hr capsule 120 mg  120 mg Oral Daily Rise Patience, MD   120 mg at 11/08/15 0934  . famotidine (PEPCID) tablet 10 mg  10 mg Oral Daily Charlynne Cousins, MD   10 mg at 11/08/15 0933  . fentaNYL (SUBLIMAZE) injection 25-50 mcg  25-50 mcg Intravenous Q2H PRN Rahul P Desai, PA-C      . furosemide (LASIX) tablet 40 mg  40 mg Oral Daily Charlynne Cousins, MD   40 mg at 11/08/15 0934  . latanoprost (XALATAN) 0.005 % ophthalmic solution 1 drop  1 drop Both Eyes QHS Charlynne Cousins, MD   1 drop at 11/07/15 2332  . levothyroxine (SYNTHROID, LEVOTHROID) tablet 50 mcg  50 mcg Oral Once per day on Sun Mon Tue Thu Fri Sat Charlynne Cousins, MD   50 mcg at 11/07/15 0617  . levothyroxine (SYNTHROID, LEVOTHROID) tablet 75 mcg  75 mcg Oral Once per day on Wed Charlynne Cousins, MD   75 mcg at 11/08/15 0801  . LORazepam (ATIVAN) tablet 0.5 mg  0.5 mg Oral Q6H PRN Charlynne Cousins, MD      . magnesium oxide (MAG-OX) tablet 400 mg  400 mg Oral QHS Charlynne Cousins, MD   400 mg at 11/07/15 2330  . ondansetron (ZOFRAN) tablet 4 mg  4 mg Oral Q6H PRN Rise Patience, MD       Or  . ondansetron [facility])  injection 4 mg  4 mg Intravenous Q6H PRN Rise Patience, MD      . oxyCODONE-acetaminophen (PERCOCET/ROXICET) 5-325 MG per tablet 1 tablet  1 tablet Oral Q8H PRN Charlynne Cousins, MD      . polyethylene glycol [facility]) packet 17 g  17 g Oral Daily Charlynne Cousins, MD   17 g at 11/07/15 0931  . polyvinyl alcohol (LIQUIFILM TEARS) 1.4 % ophthalmic solution 1 drop  1 drop Both Eyes PRN Charlynne Cousins, MD   1 drop at 11/03/15 2159  . sertraline (ZOLOFT) tablet 25 mg  25 mg Oral Daily Rise Patience, MD   25 mg at 11/08/15 0933  . sodium chloride (MURO 128) 5 % ophthalmic solution  1 drop  1 drop Left Eye TID Rise Patience, MD   1 drop at 11/08/15 [phone]  . timolol (TIMOPTIC) 0.5 % ophthalmic solution 1 drop  1 drop Both Eyes BID Rise Patience, MD   1 drop at 11/08/15 0936  . traMADol (ULTRAM) tablet 50 mg  50 mg Oral Q6H PRN Rise Patience, MD   50 mg at 11/06/15 1042   Do not use this list as official medication orders. Please verify with discharge summary.  Discharge Medications:   Medication List    ASK your doctor about these medications        acetaminophen 325 MG tablet  Commonly known as:  TYLENOL  You can take 2 tablets every 4 hours as needed.  You cannot take more than 4000 mg of tylenol per day.  This is also in your sleep aide, and it is in your prescribed pain medicine. So you need to figure out.  Do not take more than 12 tablets of tylenol (acetaminophen) per day.     apraclonidine 0.5 % ophthalmic solution  Commonly known as:  IOPIDINE  Place 1 drop into the right eye every 12 (twelve) hours.     budesonide-formoterol 80-4.5 MCG/ACT inhaler  Commonly known as:  SYMBICORT  Inhale 2 puffs into the lungs 2 (two) times daily.     calcium carbonate 500 MG chewable tablet  Commonly known as:  TUMS - dosed in mg elemental calcium  Chew 2 tablets by mouth daily as needed for indigestion or heartburn (indigestion).     CRANBERRY  CONCENTRATE 500 MG Caps  Generic drug:  Cranberry  Take 500 mg by mouth 2 (two) times daily.     DIGOX 0.125 MG tablet  Generic drug:  digoxin  TAKE 1 TABLET ONCE DAILY.     diltiazem 120 MG 24 hr capsule  Commonly known as:  CARDIZEM CD  TAKE (1) CAPSULE DAILY.     ELIQUIS 2.5 MG Tabs tablet  Generic drug:  apixaban  TAKE 1 TABLET TWICE DAILY.     ENSURE PLUS Liqd  Take 1 Can by mouth daily.     estradiol 0.1 MG/GM vaginal cream  Commonly known as:  ESTRACE  Place 2 g vaginally 2 (two) times a week. On Wednesdays & Saturdays.     gabapentin 100 MG capsule  Commonly known as:  NEURONTIN  One pill @ bedtime as needed for back pain     levothyroxine 50 MCG tablet  Commonly known as:  SYNTHROID, LEVOTHROID  TAKE 1 TABLET ONCE DAILY.     LORazepam 1 MG tablet  Commonly known as:  ATIVAN  TAKE 1 TABLET EVERY 8-12 HOURS AS NEEDED. CAN BE HABIT FORMING & MAY AFFECT MENTAL ALERTNESS.     LUMIGAN 0.01 % Soln  Generic drug:  bimatoprost  Place 1 drop into both eyes at bedtime.     Magnesium Oxide 500 MG Tabs  Take 500 mg by mouth at bedtime.     Misc Intestinal Flora Regulat Caps  Take 1 capsule by mouth daily.     oxyCODONE-acetaminophen 5-325 MG tablet  Commonly known as:  PERCOCET/ROXICET  Take 1 tablet by mouth every 8 (eight) hours as needed for moderate pain or severe pain.     OXYGEN  Inhale into the lungs continuous.     polyethylene glycol powder powder  Commonly known as:  GLYCOLAX/MIRALAX  Take 17 g by mouth daily. Mix 1 capful of powder and dissolve it in any  liquid. Drink once daily.     RANITIDINE HCL PO  Take 1 tablet by mouth daily as needed (heartburn).     sertraline 25 MG tablet  Commonly known as:  ZOLOFT  Take 1 tablet (25 mg total) by mouth daily.     sodium chloride 5 % ophthalmic solution  Commonly known as:  MURO 128  Place 1 drop into the left eye 3 (three) times daily.     SYSTANE BALANCE OP  Place 1 drop into both eyes as needed  (dry and pain).     timolol 0.5 % ophthalmic solution  Commonly known as:  TIMOPTIC  Place 1 drop into both eyes 2 (two) times daily.     traMADol 50 MG tablet  Commonly known as:  ULTRAM  Take 1 tablet (50 mg total) by mouth every 6 (six) hours as needed for moderate pain.     trimethoprim 100 MG tablet  Commonly known as:  TRIMPEX  Take 100 mg by mouth every other day.     VENTOLIN HFA 108 (90 BASE) MCG/ACT inhaler  Generic drug:  albuterol  Inhale 2 puffs into the lungs every 6 (six) hours as needed for wheezing or shortness of breath.     Vitamin D 2000 UNITS tablet  Take 2,000 Units by mouth daily.     VYTORIN 10-40 MG tablet  Generic drug:  ezetimibe-simvastatin  TAKE 1 TABLET ONCE DAILY.        Relevant Imaging Results:  Relevant Lab Results:  Recent Labs    Additional [place], LCSW

## 2015-11-08 NOTE — Clinical Social Work Placement (Signed)
   CLINICAL SOCIAL WORK PLACEMENT  NOTE  Date:  11/08/2015  Patient Details  Name: Kristine Simmons MRN: [id number] Date of Birth: 02-Dec-1927  Clinical Social Work is seeking post-discharge placement for this patient at the [place] level of care (*CSW will initial, date and re-position this form in  chart as items are completed):  Yes   Patient/family provided with [place] Work Department's list of facilities offering this level of care within the geographic area requested by the patient (or if unable, by the patient's family).  Yes   Patient/family informed of their freedom to choose among providers that offer the needed level of care, that participate in Medicare, Medicaid or managed care program needed by the patient, have an available bed and are willing to accept the patient.  Yes   Patient/family informed of [facility]'s ownership interest in [facility] and [facility], as well as of the fact that they are under no obligation to receive care at these facilities.  PASRR submitted to EDS on 11/08/15     PASRR number received on 11/08/15     Existing PASRR number confirmed on       FL2 transmitted to all facilities in geographic area requested by pt/family on 11/08/15     FL2 transmitted to all facilities within larger geographic area on       Patient informed that his/her managed care company has contracts with or will negotiate with certain facilities, including the following:        Yes   Patient/family informed of bed offers received.  Patient chooses bed at [facility]     Physician recommends and patient chooses bed at      Patient to be transferred to [facility] on 11/08/15.  Patient to be transferred to facility by ptar     Patient family notified on 11/08/15 of transfer.  Name of family member notified:  Rush Landmark (husband)     PHYSICIAN Please sign FL2, Please sign DNR     Additional  Comment:    _______________________________________________ [place], LCSW 11/08/2015, 2:10 PM

## 2015-11-08 NOTE — Clinical Social Work Note (Signed)
Clinical Social Work Assessment  Patient Details  Name: Kristine Simmons MRN: [id number] Date of Birth: 15-Feb-1927  Date of referral:  11/08/15               Reason for consult:  Facility Placement                Permission sought to share information with:  Facility Sport and exercise psychologist, Family Supports Permission granted to share information::  Yes, Verbal Permission Granted  Name::     Burniece Dishon, Spouse  Agency::  [facility] SNF's  Relationship::  Spouse  Contact Information:     Housing/Transportation Living arrangements for the past 2 months:  [place] of Information:  Patient, Case Manager Patient Interpreter Needed:  None Criminal Activity/Legal Involvement Pertinent to Current Situation/Hospitalization:  No - Comment as needed Significant Relationships:  Adult Children, Spouse Lives with:  Spouse Do you feel safe going back to the place where you live?  No Need for family participation in patient care:  Yes (Comment)  Care giving concerns:  Spoke w/ patient and her husband regarding possibility of SNF placement. Patient's husband is currently unable to care for her at their home given patient's current needs.    Social Worker assessment / plan:  Spoke with patient and her husband concerning possibility of rehab at [facility] before returning home.  Employment status:  Retired Nurse, adult PT Recommendations:  [place] / Referral to community resources:  [place]  Patient/Family's Response to care:  Patient and her husband recognize need for rehab before returning home and are agreeable to a SNF in [place].  Patient/Family's Understanding of and Emotional Response to Diagnosis, Current Treatment, and Prognosis:  Patient is realistic regarding therapy needs. No questions/concerns about treatment.  Emotional Assessment Appearance:  Appears stated age Attitude/Demeanor/Rapport:     Affect (typically observed):  Quiet, Appropriate Orientation:  Oriented to Place, Oriented to Self, Oriented to  Time, Oriented to Situation Alcohol / Substance use:  Not Applicable Psych involvement (Current and /or in the community):  No (Comment)  Discharge Needs  Concerns to be addressed:  Care Coordination Readmission within the last 30 days:  No Current discharge risk:  None Barriers to Discharge:  No Barriers Identified   Benard Halsted, LCSW 11/08/2015, 1:00 PM

## 2015-11-08 NOTE — Evaluation (Signed)
Occupational Therapy Evaluation Patient Details Name: Kristine Simmons MRN: [id number] DOB: March 18, 1927 Today's Date: 11/08/2015    History of Present Illness Patient is a 81 y/o female with hx of A-fib, CVA, blindness, chronic respiratory failure secondary to COPD and chronic aspiration, hypothyroidism, who has had recent T12 compression fracture and was noted to have weakness and hypoxia at PCP and transferred to ED. In the ER pt had required initially nonrebreather and chest x-ray shows bilateral pleural effusion with significant elevated BNP levels   Clinical Impression   Patient presenting with decreased ADL and functional mobility independence secondary to above. Patient independent to mod I PTA. Patient currently functioning at an overall min assist level. Patient will benefit from acute OT to increase overall independence in the areas of ADLs, functional mobility, and overall safety in order to safely discharge to venue listed below.     Follow Up Recommendations  SNF;Supervision/Assistance - 24 hour    Equipment Recommendations  None recommended by OT    Recommendations for Other Services  None at this time   Precautions / Restrictions Precautions Precautions: Back Precaution Comments: monitor 02 Required Braces or Orthoses: Spinal Brace Spinal Brace: Thoracolumbosacral orthotic (donned when OOB) Restrictions Weight Bearing Restrictions: No      Mobility Bed Mobility General bed mobility comments: Pt seated EOB upon OT entering room and in recliner upon OT exiting room  Transfers Overall transfer level: Needs assistance Equipment used: 1 person hand held assist Transfers: Sit to/from Stand;Stand Pivot Transfers Sit to Stand: Min assist Stand pivot transfers: Min assist General transfer comment: Min assist for safety and to steady patient. Cues for safety, technique, and to adhere to back precautions.     Balance Overall balance assessment: Needs  assistance Sitting-balance support: No upper extremity supported;Feet supported Sitting balance-Leahy Scale: Good     Standing balance support: No upper extremity supported;During functional activity Standing balance-Leahy Scale: Poor    ADL Overall ADL's : Needs assistance/impaired Eating/Feeding: Set up;Sitting   Grooming: Set up;Sitting   Upper Body Bathing: Minimal assitance;Sitting   Lower Body Bathing: Minimal assistance;Sit to/from stand   Upper Body Dressing : Minimal assistance;Sitting   Lower Body Dressing: Moderate assistance;Sit to/from stand   Toilet Transfer: Minimal assistance;BSC Functional mobility during ADLs: Minimal assistance General ADL Comments: Pt found seated EOB, unsupported with pain in back. Pt willing to work with therapist. Therapist discussed back precautions and back brace wearing schedule. Therapist assisted pt with donning of spinal brace. Pt then stood and performed stand pivot transfer onto recliner, therapist providing hand held assistance for safety and to steady patient.     Pertinent Vitals/Pain Pain Assessment: 0-10 Pain Score: 4  Pain Location: back Pain Descriptors / Indicators: Aching;Discomfort;Dull Pain Intervention(s): Monitored during session;Repositioned     Hand Dominance Right   Extremity/Trunk Assessment Upper Extremity Assessment Upper Extremity Assessment: Generalized weakness   Lower Extremity Assessment Lower Extremity Assessment: Generalized weakness       Communication Communication Communication: No difficulties   Cognition Arousal/Alertness: Awake/alert Behavior During Therapy: WFL for tasks assessed/performed Overall Cognitive Status: Within Functional Limits for tasks assessed              Home Living Family/patient expects to be discharged to:: Skilled nursing facility Living Arrangements: Spouse/significant other Available Help at Discharge: Family Type of Home: House Home Access: Stairs to  enter Technical brewer of Steps: 1 + 1 Entrance Stairs-Rails: None Home Layout: One level     Bathroom Shower/Tub: Walk-in shower;Door  Bathroom Toilet: Handicapped height     Home Equipment: Environmental consultant - 2 wheels;Cane - single point;[place] - 4 wheels;Bedside commode;Shower seat   Prior Functioning/Environment Level of Independence: Needs assistance  Gait / Transfers Assistance Needed: holds onto objects and/or spouse's arm. Uses SPC at times.  ADL's / Homemaking Assistance Needed: spouse assists due to vision   Comments: Multiple falls at home    OT Diagnosis: Generalized weakness;Acute pain   OT Problem List: Decreased strength;Decreased activity tolerance;Impaired balance (sitting and/or standing);Decreased safety awareness;Decreased knowledge of use of DME or AE;Pain   OT Treatment/Interventions: Self-care/ADL training;Energy conservation;DME and/or AE instruction;Therapeutic activities;Patient/family education;Balance training    OT Goals(Current goals can be found in the care plan section) Acute Rehab OT Goals Patient Stated Goal: none stated OT Goal Formulation: With patient Time For Goal Achievement: 11/22/15 Potential to Achieve Goals: Good ADL Goals Pt Will Perform Grooming: with supervision;standing Pt Will Perform Lower Body Bathing: with supervision;sit to/from stand;with adaptive equipment Pt Will Perform Lower Body Dressing: with supervision;with adaptive equipment;sit to/from stand Pt Will Transfer to Toilet: with supervision;bedside commode;ambulating Additional ADL Goal #1: Pt will be supervision for donning/doffing of spinal brace and pt will be able to verbalize wearing orders independently Additional ADL Goal #2: Pt will verbalize and adhere to back precautions 100% of the time independently  OT Frequency: Min 2X/week   Barriers to D/C: None known at this time, however patient states her husbands health is declining and she is not sure he can assist  post acute d/c    End of Session Equipment Utilized During Treatment: Back brace Nurse Communication: Mobility status  Activity Tolerance: Patient tolerated treatment well Patient left: in chair;with call bell/phone within reach   Time: 0852-0910 OT Time Calculation (min): 18 min Charges:  OT General Charges $OT Visit: 1 Procedure OT Evaluation $Initial OT Evaluation Tier I: 1 Procedure  Valborg Friar , MS, OTR/L, CLT Pager: [phone]  11/08/2015, 9:50 AM

## 2015-11-08 NOTE — Discharge Summary (Addendum)
Physician Discharge Summary  Kristine Simmons [id number] DOB: 1927/02/14 DOA: 11/02/2015  PCP: Unice Cobble, MD  Admit date: 11/02/2015 Discharge date: 11/08/2015  Time spent:35  minutes  Recommendations for Outpatient Follow-up:  1. Discharged to skilled nursing facility. Completes antibiotics on 11/18. 2. Please monitor daily weight and I/O. 3. Has follow-up with Dr. Saintclair Halsted on 11/17   Discharge Diagnoses:  Principal Problem:   Acute on chronic respiratory failure with hypoxia [facility])   Active Problems:   Hypothyroidism   Hyperlipidemia   Chronic atrial fibrillation (HCC)   T12 compression fracture (HCC)   Acute diastolic heart failure ([place])   Palliative care encounter   Goals of care, counseling/discussion   Cardiorenal syndrome   Discharge Condition: Fair  Diet recommendation: Heart healthy  Filed Weights   11/05/15 0500 11/06/15 1018 11/08/15 0513  Weight: 56.1 kg (123 lb 10.9 oz) 55.8 kg (123 lb 0.3 oz) 53.7 kg (118 lb 6.2 oz)    History of present illness:  Please refer to admission H&P for details, in brief, 81 y.o. female historical history of chronic atrial fibrillation on this chronic respiratory failure secondary to COPD and chronic aspiration who has had recent T12 compression fracture and is on brace had followed up with her primary care who found her  to be weak and hypoxic . She was sent to the ER where she was found to be and hypoxic respiratory failure, requiring initially a nonrebreather, chest x-ray showed bilateral pleural effusion with significant elevated BNP, and had elevated LFTs likely due to heart failure. She and placed on IV Lasix and nebulizer.   Hospital Course:  Acute on chronic respiratory failure with hypoxia (HCC) likely due to acute diastolic heart failure and aspiration PNA: - Diuresed well with Lasix. 2-D echo showed no diastolic heart failure, mod Ao. Stenosis. - Now on oral Lasix. Also on oral Augmentin (will complete a day  course of antibiotics on 11/18). - Appreciate evaluation by palliative care. She is now now DNR/DNI. -Seen by speech and swallow and patient is placed on dysphagia level III diet (mechanical soft) with thin liquid.  Transaminitis: - Possibly hepatic condition with underlying CHF. Repeat LFTs this morning improving. Marland Kitchen  COPD: Has remained stable. Chronically On 4 L O2 via nasal cannula. Continue home inhalers and nebs.  Acute kidney injury/mild hypokalemia/cardiorenal syndrome: Creatinine returned to baseline. hypokalemia improved.  Hypothyroidism: - Continue Synthroid.   Hyperlipidemia Hold statins.  Chronic atrial fibrillation ([place]): - Rate controlled on digoxin. - Continue eluquis.  T12 compression fracture (HCC) - Continue brace and narcotics and MiraLAX to prevent constipation. Has follow up with Dr Saintclair Halsted on 11/17  Mild Aortic stenosis:  Protein calorie malnutrition Continue supplement  Patient seen by physical therapy and recommended nursing facility. Patient is hemodynamically stable to be discharged.  CODE STATUS: DO NOT RESUSCITATE   Family Communication: Husband at bedside  Disposition: Skilled nursing facility  Procedures:  2-D echo  Consultations:  none  Discharge Exam: Filed Vitals:   11/08/15 1408  BP: 131/59  Pulse: 47  Temp: 97.7 F (36.5 C)  Resp: 18    General: Elderly thin built female not in distress HEENT: Pallor, moist oral mucosa, supple neck, no JVD Chest: Fine bibasilar crackles, no rhonchi or wheeze Cardiovascular: S1and S2 irregular, no murmurs rub or gallop GI: Soft, nondistended, nontender, bowel sounds present Musculoskeletal: Warm, no edema CNS: Alert and oriented  Discharge Instructions    Current Discharge Medication List    START taking these medications  Details  amoxicillin-clavulanate (AUGMENTIN) 875-125 MG tablet Take 1 tablet by mouth every 12 (twelve) hours. Qty: 4 tablet, Refills: 0    furosemide  (LASIX) 40 MG tablet Take 1 tablet (40 mg total) by mouth daily. Qty: 30 tablet, Refills: 0      CONTINUE these medications which have NOT CHANGED   Details  acetaminophen (TYLENOL) 325 MG tablet You can take 2 tablets every 4 hours as needed.  You cannot take more than 4000 mg of tylenol per day.  This is also in your sleep aide, and it is in your prescribed pain medicine. So you need to figure out.  Do not take more than 12 tablets of tylenol (acetaminophen) per day.    albuterol (VENTOLIN HFA) 108 (90 BASE) MCG/ACT inhaler Inhale 2 puffs into the lungs every 6 (six) hours as needed for wheezing or shortness of breath.    apraclonidine (IOPIDINE) 0.5 % ophthalmic solution Place 1 drop into the right eye every 12 (twelve) hours.    budesonide-formoterol (SYMBICORT) 80-4.5 MCG/ACT inhaler Inhale 2 puffs into the lungs 2 (two) times daily. Qty: 1 Inhaler, Refills: 12    calcium carbonate (TUMS - DOSED IN MG ELEMENTAL CALCIUM) 500 MG chewable tablet Chew 2 tablets by mouth daily as needed for indigestion or heartburn (indigestion).    Cholecalciferol (VITAMIN D) 2000 UNITS tablet Take 2,000 Units by mouth daily.    Cranberry (CRANBERRY CONCENTRATE) 500 MG CAPS Take 500 mg by mouth 2 (two) times daily.     DIGOX 125 MCG tablet TAKE 1 TABLET ONCE DAILY. Qty: 90 tablet, Refills: 3    diltiazem (CARDIZEM CD) 120 MG 24 hr capsule TAKE (1) CAPSULE DAILY. Qty: 90 capsule, Refills: 1    ELIQUIS 2.5 MG TABS tablet TAKE 1 TABLET TWICE DAILY. Qty: 180 tablet, Refills: 1    ENSURE PLUS (ENSURE PLUS) LIQD Take 1 Can by mouth daily. Qty: 24 Can, Refills: 11    estradiol (ESTRACE) 0.1 MG/GM vaginal cream Place 2 g vaginally 2 (two) times a week. On Wednesdays & Saturdays.    levothyroxine (SYNTHROID, LEVOTHROID) 50 MCG tablet TAKE 1 TABLET ONCE DAILY. Qty: 90 tablet, Refills: 1    LUMIGAN 0.01 % SOLN Place 1 drop into both eyes at bedtime.    Magnesium Oxide 500 MG TABS Take 500 mg by mouth at  bedtime.     OXYGEN Inhale into the lungs continuous.    polyethylene glycol powder (GLYCOLAX/MIRALAX) powder Take 17 g by mouth daily. Mix 1 capful of powder and dissolve it in any liquid. Drink once daily. Qty: 255 g, Refills: 0    Probiotic Product (MISC INTESTINAL FLORA REGULAT) CAPS Take 1 capsule by mouth daily.      Propylene Glycol (SYSTANE BALANCE OP) Place 1 drop into both eyes as needed (dry and pain).    RANITIDINE HCL PO Take 1 tablet by mouth daily as needed (heartburn).    sertraline (ZOLOFT) 25 MG tablet Take 1 tablet (25 mg total) by mouth daily. Qty: 30 tablet, Refills: 5    sodium chloride (MURO 128) 5 % ophthalmic solution Place 1 drop into the left eye 3 (three) times daily.    timolol (TIMOPTIC) 0.5 % ophthalmic solution Place 1 drop into both eyes 2 (two) times daily.     traMADol (ULTRAM) 50 MG tablet Take 1 tablet (50 mg total) by mouth every 6 (six) hours as needed for moderate pain. Qty: 50 tablet, Refills: 0   Associated Diagnoses: Acute bilateral thoracic back  pain    trimethoprim (TRIMPEX) 100 MG tablet Take 100 mg by mouth every other day.     VYTORIN 10-40 MG per tablet TAKE 1 TABLET ONCE DAILY. Qty: 90 tablet, Refills: 3      STOP taking these medications     oxyCODONE-acetaminophen (PERCOCET/ROXICET) 5-325 MG tablet      gabapentin (NEURONTIN) 100 MG capsule      LORazepam (ATIVAN) 1 MG tablet        Allergies  Allergen Reactions  . Hydrocodone Nausea And Vomiting  . Desmopressin Acetate     REACTION: swelling feet  . Dorzolamide Hcl Other (See Comments)    Burning in eyes and redness  . Prempro [Conj Estrog-Medroxyprogest Ace]     unknown  . Ciprofloxacin     REACTION: vaginal infection   Follow-up Information    Please follow up.   Why:  MD at SNF in 1 week       The results of significant diagnostics from this hospitalization (including imaging, microbiology, ancillary and laboratory) are listed below for reference.     Significant Diagnostic Studies: Dg Chest 2 View  11/07/2015  CLINICAL DATA:  Dyspnea 3 days. EXAM: CHEST  2 VIEW COMPARISON:  11/02/2015 FINDINGS: Lungs are adequately inflated with small bilateral pleural effusions improving compared to the prior exam. No definite focal airspace process. Mild stable cardiomegaly. Stable calcified left infrahilar lymph node. Calcified plaque over the aortic arch. Stable moderate compression deformity over the lower thoracic spine. Left shoulder arthroplasty unchanged. IMPRESSION: Improving small pleural effusions. Stable cardiomegaly. Stable moderate compression fracture over the lower thoracic spine. Electronically Signed   By: Marin Olp M.D.   On: 11/07/2015 17:16   Dg Chest 2 View  11/02/2015  CLINICAL DATA:  Recent back injury, shortness of breath. EXAM: CHEST  2 VIEW COMPARISON:  October 10, 2015 FINDINGS: The heart size and mediastinal contours are stable. The heart size is enlarged. There are small bilateral pleural effusions with compression atelectasis of bilateral lung bases. There is no focal pneumonia. Left shoulder replacement is identified unchanged. IMPRESSION: Small bilateral pleural effusions with adjacent compression atelectasis of the lung bases. Electronically Signed   By: Abelardo Diesel M.D.   On: 11/02/2015 16:48   Dg Chest 2 View  10/10/2015  CLINICAL DATA:  Patient with shortness of breath and COPD. Follow-up for probable pneumonia. EXAM: CHEST  2 VIEW COMPARISON:  CT chest 04/08/2015; chest radiograph 04/08/2015 FINDINGS: Stable enlarged cardiac and mediastinal contours. Pulmonary hyperinflation. Stable bilateral coarse interstitial opacities and apical emphysematous change. Interval improvement and near complete resolution of previously described retrocardiac opacities. Small bilateral pleural effusions. Mid thoracic spine degenerative changes. IMPRESSION: Near complete resolution of previously described retrocardiac opacity. Persistent  pulmonary hyperinflation and apical emphysematous change. Probable small bilateral pleural effusions. Electronically Signed   By: Lovey Newcomer M.D.   On: 10/10/2015 14:12   Dg Thoracic Spine 2 View  10/30/2015  CLINICAL DATA:  Status post fall 1 week ago with a T12 fracture. Thoracic spine pain. EXAM: THORACIC SPINE 2 VIEWS COMPARISON:  CT abdomen and pelvis 10/30/2015. FINDINGS: Compression fracture of T12 is identified with vertebral body height loss estimated approximately 60%. The appearance is not markedly changed. No new fracture is seen. Convex right lumbar scoliosis is partially visualized. Aortic atherosclerosis noted. IMPRESSION: No marked change in a T12 compression fracture.  No new abnormality. Electronically Signed   By: Inge Rise M.D.   On: 10/30/2015 18:04   Ct Lumbar Spine Wo  Contrast  10/24/2015  CLINICAL DATA:  [place] today and injured back. EXAM: CT LUMBAR SPINE WITHOUT CONTRAST TECHNIQUE: Multidetector CT imaging of the lumbar spine was performed without intravenous contrast administration. Multiplanar CT image reconstructions were also generated. COMPARISON:  MRI 05/30/2015. FINDINGS: Normal alignment of the lumbar vertebral bodies. No acute fracture is identified. The transverse and spinous processes are intact. Advanced multilevel facet disease most significant in the lower lumbar spine but no pars defects. Advanced degenerative disc disease at L5-S1 but the spinal canal is fairly generous and no obvious spinal stenosis. Stable diffuse bulging and slightly uncovered disc at L4-5 with moderate spinal and bilateral lateral recess stenosis. No foraminal stenosis. Mild extra foraminal encroachment on the left L4 nerve root. Stable laminectomy defect. The visualized upper pelvis is intact. No obvious sacral fractures. Severe osteoporosis. Extensive aortic calcifications but no significant retroperitoneal findings. Bilateral pleural effusions are noted along with calcified splenic  granuloma. IMPRESSION: 1. No acute fracture involving the lumbar spine. 2. Stable disc disease and facet disease when compared to prior MRI. Stable spinal, lateral recess and left foraminal stenosis at L4-5. 3. Advanced atherosclerotic calcifications involving the aorta but no focal aneurysm. Electronically Signed   By: Marijo Sanes M.D.   On: 10/24/2015 12:02   Ct Abdomen Pelvis W Contrast  10/30/2015  CLINICAL DATA:  Patient fell 10/24/2015 with low back pain after the fall, now with mid abdominal pain, patient anticoagulated EXAM: CT ABDOMEN AND PELVIS WITH CONTRAST TECHNIQUE: Multidetector CT imaging of the abdomen and pelvis was performed using the standard protocol following bolus administration of intravenous contrast. CONTRAST:  73mL OMNIPAQUE IOHEXOL 300 MG/ML  SOLN COMPARISON:  10/24/15, 05/30/2015 FINDINGS: Lower chest: There are moderate bilateral pleural effusions. Pleural fluid is seen on the prior CT scan. There is bilateral posterior lower lobe consolidation most consistent with atelectasis. There is mild cardiac enlargement. Hepatobiliary: Mild hepatic steatosis. Several tiny hepatic calcifications. Small focus of relative sparing from steatosis near the falciform ligament. Pancreas: Normal Spleen: Large number of small calcifications consistent with prior granulomatous exposure Adrenals/Urinary Tract: Adrenal glands are normal. No acute renal abnormalities. Bladder grossly normal although largely obscured by beam artifact from right hip replacement. Stomach/Bowel: Stomach and small bowel are normal. There is a moderate to large volume of stool retained throughout the colon. There is sigmoid colon diverticulosis. Vascular/Lymphatic: There is severe atherosclerotic aortoiliac calcification. No significant adenopathy. Reproductive: Limited evaluation of reproductive organs given artifact described above. Reproductive organs are grossly normal. Other: No free fluid or free air in the abdomen or  pelvis. Musculoskeletal: No evidence of abdominal wall hematoma. Moderate T12 compression deformity with some retropulsion causing moderate canal narrowing. This compression deformity is new from 05/30/2015. The T12 vertebral body with not included fully on 10/24/2015 CT scan of the lumbar spine. IMPRESSION: No evidence of abdominal or pelvic hematoma. Moderate T12 compression deformity associated with canal narrowing due to retropulsion. This is new from 05/30/2015. Bilateral pleural effusions with underlying atelectasis Hepatic and splenic calcifications. Electronically Signed   By: Skipper Cliche M.D.   On: 10/30/2015 14:53    Microbiology: Recent Results (from the past 240 hour(s))  MRSA PCR Screening     Status: None   Collection Time: 11/02/15 10:57 PM  Result Value Ref Range Status   MRSA by PCR NEGATIVE NEGATIVE Final    Comment:        The GeneXpert MRSA Assay (FDA approved for NASAL specimens only), is one component of a comprehensive MRSA colonization surveillance  program. It is not intended to diagnose MRSA infection nor to guide or monitor treatment for MRSA infections.   Culture, blood (routine x 2)     Status: None   Collection Time: 11/03/15 12:05 AM  Result Value Ref Range Status   Specimen Description BLOOD RIGHT ARM  Final   Special Requests BOTTLES DRAWN AEROBIC ONLY 10CC  Final   Culture NO GROWTH 5 DAYS  Final   Report Status 11/08/2015 FINAL  Final  Culture, blood (routine x 2)     Status: None   Collection Time: 11/03/15 12:15 AM  Result Value Ref Range Status   Specimen Description BLOOD RIGHT HAND  Final   Special Requests BOTTLES DRAWN AEROBIC ONLY 8CC  Final   Culture NO GROWTH 5 DAYS  Final   Report Status 11/08/2015 FINAL  Final     Labs: Basic Metabolic Panel:  Recent Labs Lab 11/04/15 0402 11/05/15 0430 11/06/15 0515 11/07/15 0708 11/08/15 0536  NA 137 138 138 135 137  K 2.9* 4.0 3.3* 4.1 3.7  CL 85* 90* 87* 89* 90*  CO2 38* 34* 38*  37* 37*  GLUCOSE 105* 97 115* 103* 102*  BUN 14 19 23* 22* 19  CREATININE 0.96 0.91 1.02* 0.86 0.94  CALCIUM 8.3* 8.8* 9.2 9.4 9.3   Liver Function Tests:  Recent Labs Lab 11/02/15 1617 11/03/15 0320 11/03/15 1005 11/07/15 0708 11/08/15 0536  AST 292* 234* 239* 45* 37  ALT 319* 275* 283* 90* 70*  ALKPHOS 61 54 60 82 83  BILITOT 1.2 1.2 1.2 0.9 1.0  PROT 6.1* 5.5* 5.7* 6.0* 5.6*  ALBUMIN 3.4* 3.4* 3.4* 3.2* 3.0*   No results for input(s): LIPASE, AMYLASE in the last 168 hours.  Recent Labs Lab 11/03/15 0846  AMMONIA 16   CBC:  Recent Labs Lab 11/02/15 1617 11/03/15 0320  WBC 9.4 9.7  NEUTROABS 7.3  --   HGB 13.3 13.1  HCT 42.6 39.4  MCV 90.4 86.8  PLT 199 156   Cardiac Enzymes:  Recent Labs Lab 11/02/15 1617 11/03/15 0005 11/03/15 0700  TROPONINI 0.04* 0.03 0.03   BNP: BNP (last 3 results)  Recent Labs  04/08/15 2135 11/02/15 1619  BNP 539.9* 1237.6*    ProBNP (last 3 results)  Recent Labs  11/18/14 0341  PROBNP 6363.0*    CBG:  Recent Labs Lab 11/07/15 0753 11/07/15 1226 11/08/15 0102 11/08/15 0644 11/08/15 1210  GLUCAP 115* 106* 76 91 93       Signed:  Micah Galeno  [facility] 11/08/2015, 2:44 PM

## 2015-11-08 NOTE — Progress Notes (Signed)
Report given to Meyer Cory, RN at [facility].

## 2015-11-08 NOTE — Progress Notes (Signed)
NURSING PROGRESS NOTE  ALIZA MUTER [id number] Discharge Data: 11/08/2015 3:27 PM Attending Provider: Louellen Molder, MD [id number] Linna Darner, MD     [name] to be D/C'd to [facility] and [facility] per MD order.  All questions fully answered. Patient to be seen by her orthopaedic MD tomorrow, per patient's daughter. All IV's discontinued with no bleeding noted. All belongings returned to patient.  Last Vital Signs:  Blood pressure 131/59, pulse 47, temperature 97.7 F (36.5 C), temperature source Oral, resp. rate 18, height 5' 7.5" (1.715 m), weight 53.7 kg (118 lb 6.2 oz), SpO2 99 %.  Discharge Medication List   Medication List    STOP taking these medications        gabapentin 100 MG capsule  Commonly known as:  NEURONTIN     LORazepam 1 MG tablet  Commonly known as:  ATIVAN     oxyCODONE-acetaminophen 5-325 MG tablet  Commonly known as:  PERCOCET/ROXICET      TAKE these medications        acetaminophen 325 MG tablet  Commonly known as:  TYLENOL  You can take 2 tablets every 4 hours as needed.  You cannot take more than 4000 mg of tylenol per day.  This is also in your sleep aide, and it is in your prescribed pain medicine. So you need to figure out.  Do not take more than 12 tablets of tylenol (acetaminophen) per day.     amoxicillin-clavulanate 875-125 MG tablet  Commonly known as:  AUGMENTIN  Take 1 tablet by mouth every 12 (twelve) hours.  Start taking on:  11/09/2015     apraclonidine 0.5 % ophthalmic solution  Commonly known as:  IOPIDINE  Place 1 drop into the right eye every 12 (twelve) hours.     budesonide-formoterol 80-4.5 MCG/ACT inhaler  Commonly known as:  SYMBICORT  Inhale 2 puffs into the lungs 2 (two) times daily.     calcium carbonate 500 MG chewable tablet  Commonly known as:  TUMS - dosed in mg elemental calcium  Chew 2 tablets by mouth daily as needed for indigestion or heartburn (indigestion).     CRANBERRY CONCENTRATE  500 MG Caps  Generic drug:  Cranberry  Take 500 mg by mouth 2 (two) times daily.     DIGOX 0.125 MG tablet  Generic drug:  digoxin  TAKE 1 TABLET ONCE DAILY.     diltiazem 120 MG 24 hr capsule  Commonly known as:  CARDIZEM CD  TAKE (1) CAPSULE DAILY.     ELIQUIS 2.5 MG Tabs tablet  Generic drug:  apixaban  TAKE 1 TABLET TWICE DAILY.     ENSURE PLUS Liqd  Take 1 Can by mouth daily.     estradiol 0.1 MG/GM vaginal cream  Commonly known as:  ESTRACE  Place 2 g vaginally 2 (two) times a week. On Wednesdays & Saturdays.     furosemide 40 MG tablet  Commonly known as:  LASIX  Take 1 tablet (40 mg total) by mouth daily.     levothyroxine 50 MCG tablet  Commonly known as:  SYNTHROID, LEVOTHROID  TAKE 1 TABLET ONCE DAILY.     LUMIGAN 0.01 % Soln  Generic drug:  bimatoprost  Place 1 drop into both eyes at bedtime.     Magnesium Oxide 500 MG Tabs  Take 500 mg by mouth at bedtime.     Misc Intestinal Flora Regulat Caps  Take 1 capsule by mouth daily.     OXYGEN  Inhale into the lungs continuous.     polyethylene glycol powder powder  Commonly known as:  GLYCOLAX/MIRALAX  Take 17 g by mouth daily. Mix 1 capful of powder and dissolve it in any liquid. Drink once daily.     RANITIDINE HCL PO  Take 1 tablet by mouth daily as needed (heartburn).     sertraline 25 MG tablet  Commonly known as:  ZOLOFT  Take 1 tablet (25 mg total) by mouth daily.     sodium chloride 5 % ophthalmic solution  Commonly known as:  MURO 128  Place 1 drop into the left eye 3 (three) times daily.     SYSTANE BALANCE OP  Place 1 drop into both eyes as needed (dry and pain).     timolol 0.5 % ophthalmic solution  Commonly known as:  TIMOPTIC  Place 1 drop into both eyes 2 (two) times daily.     trimethoprim 100 MG tablet  Commonly known as:  TRIMPEX  Take 100 mg by mouth every other day.     VENTOLIN HFA 108 (90 BASE) MCG/ACT inhaler  Generic drug:  albuterol  Inhale 2 puffs into the lungs  every 6 (six) hours as needed for wheezing or shortness of breath.     Vitamin D 2000 UNITS tablet  Take 2,000 Units by mouth daily.     VYTORIN 10-40 MG tablet  Generic drug:  ezetimibe-simvastatin  TAKE 1 TABLET ONCE DAILY.      ASK your doctor about these medications        traMADol 50 MG tablet  Commonly known as:  ULTRAM  Take 1 tablet (50 mg total) by mouth every 6 (six) hours as needed for moderate pain.         Doristine Devoid, RN

## 2015-11-10 ENCOUNTER — Telehealth: Payer: Self-pay

## 2015-11-10 NOTE — Telephone Encounter (Signed)
Pt is on TCM  Pt has been dc to SNF

## 2015-12-01 ENCOUNTER — Telehealth: Payer: Self-pay | Admitting: Internal Medicine

## 2015-12-01 NOTE — Telephone Encounter (Signed)
Verbal okay per PCP given for [facility] PT 2 x per week for 6 wks.

## 2015-12-01 NOTE — Telephone Encounter (Signed)
Gracie did pt's evaluation and is requesting verbal authoriztion for 2 x wk for 6 wks for home physical therapy. Her number is [phone]

## 2015-12-05 ENCOUNTER — Inpatient Hospital Stay: Payer: Medicare Other | Admitting: Internal Medicine

## 2015-12-05 DIAGNOSIS — Z0289 Encounter for other administrative examinations: Secondary | ICD-10-CM

## 2015-12-06 ENCOUNTER — Telehealth: Payer: Self-pay | Admitting: Internal Medicine

## 2015-12-06 NOTE — Telephone Encounter (Signed)
Malarie from [place] called to let you know pt refusing home health OT

## 2015-12-08 NOTE — Telephone Encounter (Signed)
pls advise if there is anything else that I can do, ie. Contact pt or HH?   Please let me know.

## 2016-01-02 ENCOUNTER — Other Ambulatory Visit: Payer: Self-pay | Admitting: Internal Medicine

## 2016-01-04 ENCOUNTER — Telehealth: Payer: Self-pay | Admitting: Internal Medicine

## 2016-01-04 NOTE — Telephone Encounter (Signed)
Gracie from [place] called requesting verbal orders for continuing home PT for 2x for 3wks She told me these were for Dr. Quay Burow, which is why I'm sending to you

## 2016-01-04 NOTE — Telephone Encounter (Signed)
LVM for Gracie with verbal orders

## 2016-01-04 NOTE — Telephone Encounter (Signed)
Verbal okay for MD

## 2016-01-10 ENCOUNTER — Encounter: Payer: Self-pay | Admitting: Internal Medicine

## 2016-01-10 ENCOUNTER — Other Ambulatory Visit (INDEPENDENT_AMBULATORY_CARE_PROVIDER_SITE_OTHER): Payer: Medicare Other

## 2016-01-10 ENCOUNTER — Ambulatory Visit: Payer: Medicare Other | Admitting: Internal Medicine

## 2016-01-10 VITALS — BP 112/70 | HR 80 | Temp 98.3°F | Resp 20 | Ht 67.0 in | Wt 124.5 lb

## 2016-01-10 DIAGNOSIS — Z0000 Encounter for general adult medical examination without abnormal findings: Secondary | ICD-10-CM | POA: Insufficient documentation

## 2016-01-10 DIAGNOSIS — I5031 Acute diastolic (congestive) heart failure: Secondary | ICD-10-CM

## 2016-01-10 DIAGNOSIS — Z0189 Encounter for other specified special examinations: Secondary | ICD-10-CM | POA: Diagnosis not present

## 2016-01-10 DIAGNOSIS — I10 Essential (primary) hypertension: Secondary | ICD-10-CM

## 2016-01-10 DIAGNOSIS — M48061 Spinal stenosis, lumbar region without neurogenic claudication: Secondary | ICD-10-CM

## 2016-01-10 DIAGNOSIS — F32A Depression, unspecified: Secondary | ICD-10-CM

## 2016-01-10 DIAGNOSIS — E785 Hyperlipidemia, unspecified: Secondary | ICD-10-CM | POA: Diagnosis not present

## 2016-01-10 DIAGNOSIS — F329 Major depressive disorder, single episode, unspecified: Secondary | ICD-10-CM

## 2016-01-10 HISTORY — DX: Spinal stenosis, lumbar region without neurogenic claudication: M48.061

## 2016-01-10 LAB — LIPID PANEL
CHOL/HDL RATIO: 3
CHOLESTEROL: 142 mg/dL (ref 0–200)
HDL: 51.8 mg/dL (ref >39.00)
LDL CALC: 71 mg/dL (ref 0–99)
NonHDL: 89.94
TRIGLYCERIDES: 95 mg/dL (ref 0.0–149.0)
VLDL: 19 mg/dL (ref 0.0–40.0)

## 2016-01-10 LAB — CBC WITH DIFFERENTIAL/PLATELET
BASOS ABS: 0.1 10*3/uL (ref 0.0–0.1)
Basophils Relative: 0.9 % (ref 0.0–3.0)
EOS PCT: 0.7 % (ref 0.0–5.0)
Eosinophils Absolute: 0.1 10*3/uL (ref 0.0–0.7)
HEMATOCRIT: 45.5 % (ref 36.0–46.0)
Hemoglobin: 14.7 g/dL (ref 12.0–15.0)
LYMPHS ABS: 2 10*3/uL (ref 0.7–4.0)
LYMPHS PCT: 23.3 % (ref 12.0–46.0)
MCHC: 32.2 g/dL (ref 30.0–36.0)
MCV: 86.8 fl (ref 78.0–100.0)
MONOS PCT: 9.2 % (ref 3.0–12.0)
Monocytes Absolute: 0.8 10*3/uL (ref 0.1–1.0)
NEUTROS ABS: 5.7 10*3/uL (ref 1.4–7.7)
NEUTROS PCT: 65.9 % (ref 43.0–77.0)
PLATELETS: 212 10*3/uL (ref 150.0–400.0)
RBC: 5.24 Mil/uL — AB (ref 3.87–5.11)
RDW: 15.6 % — ABNORMAL HIGH (ref 11.5–15.5)
WBC: 8.6 10*3/uL (ref 4.0–10.5)

## 2016-01-10 LAB — BASIC METABOLIC PANEL
BUN: 18 mg/dL (ref 6–23)
CALCIUM: 9.6 mg/dL (ref 8.4–10.5)
CO2: 36 mEq/L — ABNORMAL HIGH (ref 19–32)
Chloride: 99 mEq/L (ref 96–112)
Creatinine, Ser: 1.03 mg/dL (ref 0.40–1.20)
GFR: 53.87 mL/min — AB (ref >60.00)
GLUCOSE: 120 mg/dL — AB (ref 70–99)
Potassium: 4.2 mEq/L (ref 3.5–5.1)
Sodium: 142 mEq/L (ref 135–145)

## 2016-01-10 LAB — HEPATIC FUNCTION PANEL
ALT: 14 U/L (ref 0–35)
AST: 23 U/L (ref 0–37)
Albumin: 4 g/dL (ref 3.5–5.2)
Alkaline Phosphatase: 51 U/L (ref 39–117)
BILIRUBIN TOTAL: 1 mg/dL (ref 0.2–1.2)
Bilirubin, Direct: 0.2 mg/dL (ref 0.0–0.3)
Total Protein: 6.8 g/dL (ref 6.0–8.3)

## 2016-01-10 LAB — TSH: TSH: 3.74 u[IU]/mL (ref 0.35–4.50)

## 2016-01-10 MED ORDER — SERTRALINE HCL 50 MG PO TABS: 50.0000 mg | ORAL_TABLET | Freq: Every day | ORAL | Status: DC

## 2016-01-10 MED ORDER — FUROSEMIDE 40 MG PO TABS: 40.0000 mg | ORAL_TABLET | Freq: Every day | ORAL | Status: DC

## 2016-01-10 NOTE — Assessment & Plan Note (Signed)

## 2016-01-10 NOTE — Assessment & Plan Note (Signed)
Mild worsening recent, for incresaed zoloft 50 qd

## 2016-01-10 NOTE — Assessment & Plan Note (Signed)
Ok for fill lasix o/w stable overall by history and exam, recent data reviewed with pt, and pt to continue medical treatment as before,  to f/u any worsening symptoms or concerns

## 2016-01-10 NOTE — Progress Notes (Signed)
Pre visit review using our clinic review tool, if applicable. No additional management support is needed unless otherwise documented below in the visit note. 

## 2016-01-10 NOTE — Assessment & Plan Note (Signed)
stable overall by history and exam, recent data reviewed with pt, and pt to continue medical treatment as before,  to f/u any worsening symptoms or concerns  BP Readings from Last 3 Encounters:  01/10/16 112/70  11/08/15 131/59  11/02/15 100/56

## 2016-01-10 NOTE — Patient Instructions (Signed)
OK to increase the zoloft to 50 mg per day  Please continue all other medications as before, and refills have been done if requested - the lasix  Please have the pharmacy call with any other refills you may need.  Please continue your efforts at being more active, low cholesterol diet, and weight control.  You are otherwise up to date with prevention measures today.  Please keep your appointments with your specialists as you may have planned  Please go to the LAB in the Basement (turn left off the elevator) for the tests to be done today  You will be contacted by phone if any changes need to be made immediately.  Otherwise, you will receive a letter about your results with an explanation, but please check with MyChart first.  Please remember to sign up for MyChart if you have not done so, as this will be important to you in the future with finding out test results, communicating by private email, and scheduling acute appointments online when needed.  Please return in 6 months, or sooner if needed

## 2016-01-10 NOTE — Progress Notes (Signed)
Subjective:    Patient ID: Kristine Simmons, female    DOB: 1927/04/25, 82 y.o.   MRN: [id number]  HPI  Here for wellness and f/u;  Overall doing ok;  Pt denies Chest pain, worsening SOB, DOE, wheezing, orthopnea, PND, worsening LE edema, palpitations, dizziness or syncope.  Pt denies neurological change such as new headache, facial or extremity weakness.  Pt denies polydipsia, polyuria, or low sugar symptoms. Pt states overall good compliance with treatment and medications, good tolerability, and has been trying to follow appropriate diet.  Pt has hda mild worsening depressive symptoms, no suicidal ideation or panic. No fever, night sweats, wt loss, loss of appetite, or other constitutional symptoms.  Pt states good ability with ADL's, has low fall risk, home safety reviewed and adequate, no other significant changes in hearing or vision, and only occasionally active with exercise.  To f/u Dr Saintclair Halsted later this wk for t12 compression fx.  Need lasix refill now. To see Dr Hochrein/card soon as well. Past Medical History  Diagnosis Date  . Hx of colonic polyps     last colonoscopy 2005, repeat was due 2010 Dr. Sharlett Iles  . Atrial fibrillation ([place])     Dr Percival Spanish  . Fracture 1995, 2009    RUE; LUE Dr. Durward Fortes  . 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 Cystitis   . Complication of anesthesia     difficulty with arousal post op  . Hyperlipidemia   . Hyperlipidemia   . Osteoporosis   . Cerebrovascular accident ([place]) 01/09  . Hypothyroidism   . Dysrhythmia   . Spinal stenosis of lumbar region 01/10/2016  . Acute diastolic heart failure ([place]) 11/03/2015  . Aortic stenosis, mild 08/15/2014    Echo June, 2015, mild aortic stenosis   . Cardiorenal syndrome 11/05/2015  . Chronic anticoagulation 08/15/2014    Eliquis for A fib   . Chronic atrial fibrillation ([place]) 11/02/2015  . COPD, Gold grade A 04/08/2008    January 2016 pulmonary function testing> ratio 58% FEV1 1.83L (88%  pred, no change with BD), TLC 5.78L (103% pred), DLCO 9.01 (31% pred) 07/2015 ONO RA > spO2 79% average 07/2015 ONO 2LPM> < 88% 189 min 08/2015 ONO 3LPM < 88% 69.8 min 09/2015 ONO 4LPM > OK   . History of CVA (cerebrovascular accident) 08/15/2014  . Squamous cell carcinoma of neck ([place]) 03/09/2014    3/15 Dr Wilhemina Bonito   . CAROTID STENOSIS 08/08/2009    Qualifier: Diagnosis of  By: Percival Spanish, MD, Farrel Gordon    . Depression 09/22/2014  . G E R D 06/02/2007    Qualifier: Diagnosis of  By: Linna Darner MD, William    . HYPERTENSION, ESSENTIAL NOS 12/01/2007    Qualifier: Diagnosis of  By: Linna Darner MD, Gwyndolyn Saxon    . T12 compression fracture [facility]) 11/02/2015   Past Surgical History  Procedure Laterality Date  . Tonsillectomy and adenoidectomy    . Vocal cord polyps    . Thyroid needle biopsy  2002  . Appendectomy    . Cataract extraction      [place]  . Colonoscopy w/ polypectomy  2005    Diverticulosis;Dr. Sharlett Iles  . Total hip arthroplasty  2008    CHF & RAF post op   . Total shoulder replacement  2010    Dr Durward Fortes - left shoulder  . Mohs surgery  2013    nasal Basal Cell; Dr Sarajane Jews  . Excision metacarpal mass Right 08/17/2013  Procedure: RIGHT LONG EXCISION MASS AND DIP JOINT DEBRIDEMENT;  Surgeon: Tennis Must, MD;  Location: [facility];  Service: Orthopedics;  Laterality: Right;  . Femoral hernia repair Right 08/17/2014    Procedure: OPEN REPAIR RIGHT FEMORAL HERNIA  WITH INSERTION OF MESH;  Surgeon: Adin Hector, MD;  Location: [facility];  Service: General;  Laterality: Right;  . Insertion of mesh  08/17/2014    Procedure: INSERTION OF MESH;  Surgeon: Adin Hector, MD;  Location: [facility];  Service: General;;  . Inguinal hernia repair Right 11/15/2014    Procedure: LAPAROSCOPIC RIGHT INGUINAL HERNIA WITH MESH;  Surgeon: Alphonsa Overall, MD;  Location: [facility];  Service: General;  Laterality: Right;    reports that she quit smoking about 22 years ago. Her smoking use included  Cigarettes. She has a 45 pack-year smoking history. She has never used smokeless tobacco. She reports that she does not drink alcohol or use illicit drugs. family history includes Breast cancer in her sister; Cancer in her brother and mother; Colon cancer in her brother; Diabetes in her father; Heart attack in her brother; Leukemia in her mother; Lung cancer in her sister; Ovarian cancer in her sister; Prostate cancer in her brother; Stroke in her father. Allergies  Allergen Reactions  . Hydrocodone Nausea And Vomiting  . Desmopressin Acetate     REACTION: swelling feet  . Dorzolamide Hcl Other (See Comments)    Burning in eyes and redness  . Prempro [Conj Estrog-Medroxyprogest Ace]     unknown  . Ciprofloxacin     REACTION: vaginal infection   Current Outpatient Prescriptions on File Prior to Visit  Medication Sig Dispense Refill  . albuterol (VENTOLIN HFA) 108 (90 BASE) MCG/ACT inhaler Inhale 2 puffs into the lungs every 6 (six) hours as needed for wheezing or shortness of breath.    Marland Kitchen apraclonidine (IOPIDINE) 0.5 % ophthalmic solution Place 1 drop into the right eye every 12 (twelve) hours.    . budesonide-formoterol (SYMBICORT) 80-4.5 MCG/ACT inhaler Inhale 2 puffs into the lungs 2 (two) times daily. (Patient taking differently: Inhale 2 puffs into the lungs 2 (two) times daily as needed (shortness of breath). ) 1 Inhaler 12  . calcium carbonate (TUMS - DOSED IN MG ELEMENTAL CALCIUM) 500 MG chewable tablet Chew 2 tablets by mouth daily as needed for indigestion or heartburn (indigestion).    . Cholecalciferol (VITAMIN D) 2000 UNITS tablet Take 2,000 Units by mouth daily.    . Cranberry (CRANBERRY CONCENTRATE) 500 MG CAPS Take 500 mg by mouth 2 (two) times daily.     Marland Kitchen DIGOX 125 MCG tablet TAKE 1 TABLET ONCE DAILY. (Patient taking differently: TAKE 125 MCG BY MOUTH DAILY) 90 tablet 3  . diltiazem (CARDIZEM CD) 120 MG 24 hr capsule TAKE (1) CAPSULE DAILY. (Patient taking differently: Take  120 mg by mouth daily. ) 90 capsule 1  . ELIQUIS 2.5 MG TABS tablet TAKE 1 TABLET TWICE DAILY. (Patient taking differently: TAKE 2.5 MG BY MOUTH TWICE DAILY) 180 tablet 1  . ENSURE PLUS (ENSURE PLUS) LIQD Take 1 Can by mouth daily. 24 Can 11  . estradiol (ESTRACE) 0.1 MG/GM vaginal cream Place 2 g vaginally 2 (two) times a week. On Wednesdays & Saturdays.    Marland Kitchen levothyroxine (SYNTHROID, LEVOTHROID) 50 MCG tablet Take 1 tablet (50 mcg total) by mouth daily. Must estab w/new provider for future refills 90 tablet 0  . LUMIGAN 0.01 % SOLN Place 1 drop into both eyes at  bedtime.    . Magnesium Oxide 500 MG TABS Take 500 mg by mouth at bedtime.     . OXYGEN Inhale into the lungs continuous.    . polyethylene glycol powder (GLYCOLAX/MIRALAX) powder Take 17 g by mouth daily. Mix 1 capful of powder and dissolve it in any liquid. Drink once daily. 255 g 0  . Probiotic Product (MISC INTESTINAL FLORA REGULAT) CAPS Take 1 capsule by mouth daily.      Marland Kitchen Propylene Glycol (SYSTANE BALANCE OP) Place 1 drop into both eyes as needed (dry and pain).    . sodium chloride (MURO 128) 5 % ophthalmic solution Place 1 drop into the left eye 3 (three) times daily.    . timolol (TIMOPTIC) 0.5 % ophthalmic solution Place 1 drop into both eyes 2 (two) times daily.     Marland Kitchen trimethoprim (TRIMPEX) 100 MG tablet Take 100 mg by mouth every other day.     Marland Kitchen VYTORIN 10-40 MG per tablet TAKE 1 TABLET ONCE DAILY. 90 tablet 3   No current facility-administered medications on file prior to visit.    Review of Systems Constitutional: Negative for increased diaphoresis, other activity, appetite or siginficant weight change other than noted HENT: Negative for worsening hearing loss, ear pain, facial swelling, mouth sores and neck stiffness.   Eyes: Negative for other worsening pain, redness or visual disturbance.  Respiratory: Negative for shortness of breath and wheezing  Cardiovascular: Negative for chest pain and palpitations.    Gastrointestinal: Negative for diarrhea, blood in stool, abdominal distention or other pain Genitourinary: Negative for hematuria, flank pain or change in urine volume.  Musculoskeletal: Negative for myalgias or other joint complaints.  Skin: Negative for color change and wound or drainage.  Neurological: Negative for syncope and numbness. other than noted Hematological: Negative for adenopathy. or other swelling Psychiatric/Behavioral: Negative for hallucinations, SI, self-injury, decreased concentration or other worsening agitation.       Objective:   Physical Exam BP 112/70 mmHg  Pulse 80  Temp(Src) 98.3 F (36.8 C) (Oral)  Resp 20  Ht 5\' 7"  (1.702 m)  Wt 124 lb 8 oz (56.473 kg)  BMI 19.49 kg/m2  SpO2 93% VS noted, not ill appearing but frail Constitutional: Pt is oriented to person, place, and time. Appears well-developed and well-nourished, in no significant distress Head: Normocephalic and atraumatic.  Right Ear: External ear normal.  Left Ear: External ear normal.  Nose: Nose normal.  Mouth/Throat: Oropharynx is clear and moist.  Eyes: Conjunctivae and EOM are normal. Pupils are equal, round, and reactive to light.  Neck: Normal range of motion. Neck supple. No JVD present. No tracheal deviation present or significant neck LA or mass Cardiovascular: Normal rate, regular rhythm, normal heart sounds and intact distal pulses.   Pulmonary/Chest: Effort normal and breath sounds decresaed without rales or wheezing  Abdominal: Soft. Bowel sounds are normal. NT. No HSM  Musculoskeletal: Normal range of motion. Exhibits trace bipedal edema.  Lymphadenopathy:  Has no cervical adenopathy.  Neurological: Pt is alert and oriented to person, place, and time. Pt has normal reflexes. No cranial nerve deficit. Motor grossly intact Skin: Skin is warm and dry. No rash noted.  Psychiatric:  Has normal mood and affect. Behavior is normal.     Assessment & Plan:

## 2016-01-29 ENCOUNTER — Telehealth: Payer: Self-pay | Admitting: Cardiology

## 2016-01-29 NOTE — Telephone Encounter (Signed)
Pt found out when she was in the hospital in November she had "heart disease" and only gave her Lasix and put her in rehab, wants to know if she needs to fu with Hochrein [phone]

## 2016-01-31 ENCOUNTER — Telehealth: Payer: Self-pay | Admitting: Pulmonary Disease

## 2016-01-31 NOTE — Telephone Encounter (Signed)
Spoke to patient  No indication appointment is needed at present ,  Need appointment before 8 /2017 Appointment schedule for 06/17/16 at 11 am Patient aware.

## 2016-01-31 NOTE — Telephone Encounter (Signed)
Spoke with the pt  She states sats are 79% on her o2 4lpm  She then stated that she had forgot to turn her o2 back on, and believes that this was why sats so low  She turned on o2 to 4lpm and sats increased to 86%  She states that she is feeling more SOB than usual and had to use her inhaler today  OV with BQ for tomorrow at 1:30 pm  I advised her to seek emergent care sooner if needed

## 2016-02-01 ENCOUNTER — Ambulatory Visit (INDEPENDENT_AMBULATORY_CARE_PROVIDER_SITE_OTHER): Payer: Medicare Other | Admitting: Pulmonary Disease

## 2016-02-01 ENCOUNTER — Encounter: Payer: Self-pay | Admitting: Pulmonary Disease

## 2016-02-01 VITALS — BP 106/72 | Ht 67.0 in | Wt 127.0 lb

## 2016-02-01 DIAGNOSIS — R0902 Hypoxemia: Secondary | ICD-10-CM | POA: Diagnosis not present

## 2016-02-01 NOTE — Patient Instructions (Signed)
You need to use oxygen at 4Lpm, if you want a concentrator then we can order it We will see you back as previously scheduled

## 2016-02-01 NOTE — Assessment & Plan Note (Signed)
She came in today because she had been trying to decrease her oxygen level down to 3 L so that she can use a wider device that only goes up to that capacity. Not surprisingly, her oxygen level dropped down below 88% when she did this. There is nothing acute going on with her today and she is not feeling any worse. Her exam has not changed.  Plan: I explained to her today that she needs to use 4 L nasal cannula in order to maintain her O2 saturation greater than 88%. We talked about the fact that it may not be physiologically necessary to attain this number but I think that she should go ahead and by concentrator with a higher capacity because I saw her in a year or 2 she will need 5 or 6 L to maintain her O2 saturation in a reasonable range.  She is going to think about whether or not she wants to have the device, so we will make a prescription when she is ready.

## 2016-02-01 NOTE — Progress Notes (Signed)
Subjective:    Patient ID: BEATRIZ MOLLOY, female    DOB: 1927-02-27, 82 y.o.   MRN: [id number]  Synopsis: GOLD GRADE A COPD January 2016 pulmonary function testing> ratio 58% FEV1 1.83L (88% pred, no change with BD), TLC 5.78L (103% pred), DLCO 9.01 (31% pred) She has overnight hypoxemia and uses 4 L daily at bedtime.  HPI Chief Complaint  Patient presents with  . Acute Visit    pt c/o low 02 sats- dropped down to upper 70's intermittently Xcouple of weeks. pt lowered 02 couple of weeks ago to try and qualify for POC.    [place] is here today to sort out some difficultly she had been having with her oxygen measurements recently.  She had been noticing recently that her oxygen had been dropping.   On February 1 she started taking to a company ([employer]) about having a new oxygen Marine scientist.  She wanted to get a new concentrator, so she started decrease her oxygen to 3Lpm.   During this time she felt no difference.    Past Medical History  Diagnosis Date  . Hx of colonic polyps     last colonoscopy 2005, repeat was due 2010 Dr. Sharlett Iles  . Atrial fibrillation ([place])     Dr Percival Spanish  . Fracture 1995, 2009    RUE; LUE Dr. Durward Fortes  . 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 Cystitis   . Complication of anesthesia     difficulty with arousal post op  . Hyperlipidemia   . Hyperlipidemia   . Osteoporosis   . Cerebrovascular accident ([place]) 01/09  . Hypothyroidism   . Dysrhythmia   . Spinal stenosis of lumbar region 01/10/2016  . Acute diastolic heart failure ([place]) 11/03/2015  . Aortic stenosis, mild 08/15/2014    Echo June, 2015, mild aortic stenosis   . Cardiorenal syndrome 11/05/2015  . Chronic anticoagulation 08/15/2014    Eliquis for A fib   . Chronic atrial fibrillation ([place]) 11/02/2015  . COPD, Gold grade A 04/08/2008    January 2016 pulmonary function testing> ratio 58% FEV1 1.83L (88% pred, no change with BD), TLC 5.78L (103% pred), DLCO 9.01 (31%  pred) 07/2015 ONO RA > spO2 79% average 07/2015 ONO 2LPM> < 88% 189 min 08/2015 ONO 3LPM < 88% 69.8 min 09/2015 ONO 4LPM > OK   . History of CVA (cerebrovascular accident) 08/15/2014  . Squamous cell carcinoma of neck ([place]) 03/09/2014    3/15 Dr Wilhemina Bonito   . CAROTID STENOSIS 08/08/2009    Qualifier: Diagnosis of  By: Percival Spanish, MD, Farrel Gordon    . Depression 09/22/2014  . G E R D 06/02/2007    Qualifier: Diagnosis of  By: Linna Darner MD, William    . HYPERTENSION, ESSENTIAL NOS 12/01/2007    Qualifier: Diagnosis of  By: Linna Darner MD, Gwyndolyn Saxon    . T12 compression fracture ([place]) 11/02/2015      Review of Systems  Constitutional: Negative for fever, chills and fatigue.  HENT: Negative for rhinorrhea, sinus pressure and sneezing.   Respiratory: Positive for cough and shortness of breath. Negative for wheezing.   Cardiovascular: Negative for chest pain, palpitations and leg swelling.       Objective:   Physical Exam Filed Vitals:   02/01/16 1337  BP: 106/72  Height: 5\' 7"  (1.702 m)  Weight: 127 lb (57.607 kg)  SpO2: 96%  4L  Gen: well appearing, no acute distress HEENT: NCAT, EOMi, OP clear,  PULM: Scant crackles left base, normal effort CV: RRR, systolic murmur, JVD noted AB: BS+, soft, nontender,  Ext: warm, no edema, no clubbing, no cyanosis Derm: no rash or skin breakdown Neuro: A&Ox4, MAEW  Dr. Clayborn Heron notes reviewed April 2016 CT chest images personally reviewed with the family in clinic today.      Assessment & Plan:   Hypoxemia She came in today because she had been trying to decrease her oxygen level down to 3 L so that she can use a wider device that only goes up to that capacity. Not surprisingly, her oxygen level dropped down below 88% when she did this. There is nothing acute going on with her today and she is not feeling any worse. Her exam has not changed.  Plan: I explained to her today that she needs to use 4 L nasal cannula in order to maintain her O2 saturation  greater than 88%. We talked about the fact that it may not be physiologically necessary to attain this number but I think that she should go ahead and by concentrator with a higher capacity because I saw her in a year or 2 she will need 5 or 6 L to maintain her O2 saturation in a reasonable range.  She is going to think about whether or not she wants to have the device, so we will make a prescription when she is ready.    Updated Medication List Outpatient Encounter Prescriptions as of 02/01/2016  Medication Sig  . albuterol (VENTOLIN HFA) 108 (90 BASE) MCG/ACT inhaler Inhale 2 puffs into the lungs every 4 (four) hours as needed for wheezing or shortness of breath.   Marland Kitchen apraclonidine (IOPIDINE) 0.5 % ophthalmic solution Place 1 drop into the right eye every 12 (twelve) hours.  . budesonide-formoterol (SYMBICORT) 80-4.5 MCG/ACT inhaler Inhale 2 puffs into the lungs 2 (two) times daily. (Patient taking differently: Inhale 2 puffs into the lungs 2 (two) times daily as needed (shortness of breath). )  . Cranberry (CRANBERRY CONCENTRATE) 500 MG CAPS Take 500 mg by mouth 2 (two) times daily.   Marland Kitchen DIGOX 125 MCG tablet TAKE 1 TABLET ONCE DAILY. (Patient taking differently: TAKE 125 MCG BY MOUTH DAILY)  . diltiazem (CARDIZEM CD) 120 MG 24 hr capsule TAKE (1) CAPSULE DAILY. (Patient taking differently: Take 120 mg by mouth daily. )  . ELIQUIS 2.5 MG TABS tablet TAKE 1 TABLET TWICE DAILY. (Patient taking differently: TAKE 2.5 MG BY MOUTH TWICE DAILY)  . ENSURE PLUS (ENSURE PLUS) LIQD Take 1 Can by mouth daily.  Marland Kitchen estradiol (ESTRACE) 0.1 MG/GM vaginal cream Place 2 g vaginally 2 (two) times a week. On Wednesdays & Saturdays.  . furosemide (LASIX) 40 MG tablet Take 1 tablet (40 mg total) by mouth daily.  Marland Kitchen levothyroxine (SYNTHROID, LEVOTHROID) 50 MCG tablet Take 1 tablet (50 mcg total) by mouth daily. Must estab w/new provider for future refills  . LUMIGAN 0.01 % SOLN Place 1 drop into both eyes at bedtime.  .  OXYGEN Inhale into the lungs continuous.  . polyethylene glycol powder (GLYCOLAX/MIRALAX) powder Take 17 g by mouth daily. Mix 1 capful of powder and dissolve it in any liquid. Drink once daily.  . Probiotic Product (MISC INTESTINAL FLORA REGULAT) CAPS Take 1 capsule by mouth daily.    Marland Kitchen Propylene Glycol (SYSTANE BALANCE OP) Place 1 drop into both eyes as needed (dry and pain).  Marland Kitchen sertraline (ZOLOFT) 50 MG tablet Take 1 tablet (50 mg total) by mouth daily.  Marland Kitchen  sodium chloride (MURO 128) 5 % ophthalmic solution Place 1 drop into the left eye 3 (three) times daily.  . timolol (TIMOPTIC) 0.5 % ophthalmic solution Place 1 drop into both eyes 2 (two) times daily.   Marland Kitchen trimethoprim (TRIMPEX) 100 MG tablet Take 100 mg by mouth every other day.   Marland Kitchen VYTORIN 10-40 MG per tablet TAKE 1 TABLET ONCE DAILY.  . [DISCONTINUED] calcium carbonate (TUMS - DOSED IN MG ELEMENTAL CALCIUM) 500 MG chewable tablet Chew 2 tablets by mouth daily as needed for indigestion or heartburn (indigestion).  . [DISCONTINUED] Cholecalciferol (VITAMIN D) 2000 UNITS tablet Take 2,000 Units by mouth daily. Reported on 02/01/2016  . [DISCONTINUED] Magnesium Oxide 500 MG TABS Take 500 mg by mouth at bedtime. Reported on 02/01/2016   No facility-administered encounter medications on file as of 02/01/2016.

## 2016-02-02 ENCOUNTER — Telehealth: Payer: Self-pay | Admitting: Pulmonary Disease

## 2016-02-02 MED ORDER — BUDESONIDE-FORMOTEROL FUMARATE 80-4.5 MCG/ACT IN AERO: 2.0000 | INHALATION_SPRAY | Freq: Two times a day (BID) | RESPIRATORY_TRACT | Status: DC

## 2016-02-02 NOTE — Telephone Encounter (Signed)
Called spoke with pt. She is needing her symbicort RX sent into [facility]. Confirmed pt is on symbicort 80 mcg as she states she was also given a sample at [facility] yesterday. RX sent in and nothing further needed

## 2016-02-05 ENCOUNTER — Ambulatory Visit: Payer: Medicare Other | Admitting: Pulmonary Disease

## 2016-02-13 ENCOUNTER — Other Ambulatory Visit: Payer: Self-pay | Admitting: Internal Medicine

## 2016-02-13 ENCOUNTER — Telehealth: Payer: Self-pay | Admitting: General Practice

## 2016-02-13 NOTE — Telephone Encounter (Signed)
sent 

## 2016-02-28 ENCOUNTER — Telehealth: Payer: Self-pay | Admitting: Pulmonary Disease

## 2016-02-28 DIAGNOSIS — J438 Other emphysema: Secondary | ICD-10-CM

## 2016-02-28 NOTE — Telephone Encounter (Signed)
lmtcb x1 for pt. 

## 2016-02-29 ENCOUNTER — Other Ambulatory Visit: Payer: Self-pay | Admitting: Cardiology

## 2016-02-29 NOTE — Telephone Encounter (Signed)
[phone] pt calling back

## 2016-02-29 NOTE — Telephone Encounter (Signed)
Pt was calling to inquire about order for POC. Order was placed 10/11/15 for [facility] to eval for POC, however pt does not recall that anyone ever came out for a POC eval. Pt was last seen on 02/01/16 with the following rec's:  Patient Instructions     You need to use oxygen at 4Lpm, if you want a concentrator then we can order it We will see you back as previously scheduled   She has decided that she would like to proceed with a POC eval from [facility] as she was under the impression that [employer] was the "only" company that she could obtain a POC through. New order placed for [facility] eval. Pt advised to contact us if she does not hear from [facility] in the next few days.

## 2016-02-29 NOTE — Telephone Encounter (Signed)
Rx(s) sent to pharmacy electronically. MD OV in June 2017

## 2016-03-04 ENCOUNTER — Telehealth: Payer: Self-pay | Admitting: Pulmonary Disease

## 2016-03-04 NOTE — Telephone Encounter (Signed)
Patient calling to get phone number for [employer] to speak with Kristine Simmons. Called patient back and she said that she received a call from [place] [facility] since she called [place], so she no longer needs the information.

## 2016-03-27 ENCOUNTER — Other Ambulatory Visit: Payer: Self-pay | Admitting: Internal Medicine

## 2016-04-04 ENCOUNTER — Ambulatory Visit (INDEPENDENT_AMBULATORY_CARE_PROVIDER_SITE_OTHER): Payer: Medicare Other | Admitting: Internal Medicine

## 2016-04-04 ENCOUNTER — Encounter: Payer: Self-pay | Admitting: Internal Medicine

## 2016-04-04 VITALS — BP 106/52 | HR 66 | Temp 97.4°F | Resp 16 | Wt 126.0 lb

## 2016-04-04 DIAGNOSIS — I482 Chronic atrial fibrillation, unspecified: Secondary | ICD-10-CM

## 2016-04-04 DIAGNOSIS — I071 Rheumatic tricuspid insufficiency: Secondary | ICD-10-CM | POA: Insufficient documentation

## 2016-04-04 DIAGNOSIS — R0600 Dyspnea, unspecified: Secondary | ICD-10-CM

## 2016-04-04 DIAGNOSIS — R42 Dizziness and giddiness: Secondary | ICD-10-CM

## 2016-04-04 DIAGNOSIS — F329 Major depressive disorder, single episode, unspecified: Secondary | ICD-10-CM

## 2016-04-04 DIAGNOSIS — I272 Other secondary pulmonary hypertension: Secondary | ICD-10-CM | POA: Diagnosis not present

## 2016-04-04 DIAGNOSIS — J438 Other emphysema: Secondary | ICD-10-CM

## 2016-04-04 DIAGNOSIS — R0689 Other abnormalities of breathing: Secondary | ICD-10-CM

## 2016-04-04 DIAGNOSIS — F32A Depression, unspecified: Secondary | ICD-10-CM

## 2016-04-04 MED ORDER — SERTRALINE HCL 25 MG PO TABS: 25.0000 mg | ORAL_TABLET | Freq: Every day | ORAL | Status: DC

## 2016-04-04 NOTE — Progress Notes (Signed)
Subjective:    Patient ID: SENNA PECH, female    DOB: 09/20/1927, 82 y.o.   MRN: [id number]  HPI  She is here to establish with a new pcp.  She is here with her husband and daughter.   She is very tired and weak.  She has severe copd and heart failure.   Depressed -She is taking her medication, but dies not feel like it is working. She is frustrated by difficulty she has doing things she used to be able to do easily.  She is very tired and takes several naps a day.  She feels bad all the time.    She has a very poor balance and has fallen many times.  She gets tired easily from her COPD and heart failure.   She walks with a walker and her current walker is broken - the brake does not work.  She needs a prescription for a new one    Dry mouth:  Her mouth is very dry.  It is intermittent, but sometimes is severe.  She wonders if anything can be done about it.   COPD:  She is on oxygen and follows with pulmonary.  She gets sob with exertion and has a chronic cough.  She is unsure if she wheezes.  She uses her inhalers daily.   Afib, moderate TR, severe pulmonary htn:  She is taking her medication daily. She does have leg swelling, palpitations and shortness of breath, but denies chest pain and regular headaches.        Medications and allergies reviewed with patient and updated if appropriate.  Patient Active Problem List   Diagnosis Date Noted  . Wellness examination 01/10/2016  . Spinal stenosis of lumbar region 01/10/2016  . Cardiorenal syndrome 11/05/2015  . Palliative care encounter   . Goals of care, counseling/discussion   . Acute diastolic heart failure ([place]) 11/03/2015  . Respiratory distress 11/02/2015  . Acute on chronic respiratory failure with hypoxia ([place]) 11/02/2015  . Chronic atrial fibrillation ([place]) 11/02/2015  . T12 compression fracture ([place]) 11/02/2015  . Acute respiratory failure with hypoxia ([place]) 11/02/2015  . Acute on chronic respiratory failure  ([place]) 04/12/2015  . Elevated LFTs 04/12/2015  . Aspiration pneumonia ([place]) 04/09/2015  . Altered mental status   . CAP (community acquired pneumonia) 04/08/2015  . Dysphagia, pharyngoesophageal phase 03/07/2015  . Recurrent inguinal hernia with obstruction 11/11/2014  . Depression 09/22/2014  . Hypoxemia 09/22/2014  . Aortic stenosis, mild 08/15/2014  . Chronic anticoagulation 08/15/2014  . Recurrent femoral hernia with obstruction without gangrene 08/15/2014  . History of CVA (cerebrovascular accident) 08/15/2014  . Squamous cell carcinoma of neck ([place]) 03/09/2014  . Cough 09/16/2012  . Diverticulosis 09/11/2011  . Glaucoma 09/11/2011  . CAROTID STENOSIS 08/08/2009  . HEARING LOSS 11/22/2008  . COPD, Gold grade A 04/08/2008  . HERNIA 04/08/2008  . ARTHRITIS 04/08/2008  . VITAMIN D DEFICIENCY 02/01/2008  . Atrial fibrillation ([place]) 02/01/2008  . Essential hypertension 12/01/2007  . OSTEOPOROSIS 12/01/2007  . Hypothyroidism 08/06/2007  . DISEQUILIBRIUM 07/24/2007  . G E R D 06/02/2007  . THYROID NODULE, LEFT 04/10/2007  . Hyperlipidemia 04/10/2007  . COLONIC POLYPS, HX OF 04/10/2007    Current Outpatient Prescriptions on File Prior to Visit  Medication Sig Dispense Refill  . albuterol (VENTOLIN HFA) 108 (90 BASE) MCG/ACT inhaler Inhale 2 puffs into the lungs every 4 (four) hours as needed for wheezing or shortness of breath.     Marland Kitchen apraclonidine (  IOPIDINE) 0.5 % ophthalmic solution Place 1 drop into the right eye every 12 (twelve) hours.    . budesonide-formoterol (SYMBICORT) 80-4.5 MCG/ACT inhaler Inhale 2 puffs into the lungs 2 (two) times daily. 1 Inhaler 12  . Cranberry (CRANBERRY CONCENTRATE) 500 MG CAPS Take 500 mg by mouth 2 (two) times daily.     Marland Kitchen DIGOX 125 MCG tablet TAKE 1 TABLET ONCE DAILY. (Patient taking differently: TAKE 125 MCG BY MOUTH DAILY) 90 tablet 3  . diltiazem (CARDIZEM CD) 120 MG 24 hr capsule Take 1 capsule (120 mg total) by mouth daily. 90 capsule 0    . ELIQUIS 2.5 MG TABS tablet TAKE 1 TABLET TWICE DAILY. (Patient taking differently: TAKE 2.5 MG BY MOUTH TWICE DAILY) 180 tablet 1  . ENSURE PLUS (ENSURE PLUS) LIQD TAKE ONE CAN BY MOUTH ONCE DAILY 237 mL 2  . estradiol (ESTRACE) 0.1 MG/GM vaginal cream Place 2 g vaginally 2 (two) times a week. On Wednesdays & Saturdays.    . furosemide (LASIX) 40 MG tablet Take 1 tablet (40 mg total) by mouth daily. 90 tablet 3  . levothyroxine (SYNTHROID, LEVOTHROID) 50 MCG tablet TAKE 1 TABLET ONCE DAILY. 90 tablet 0  . LUMIGAN 0.01 % SOLN Place 1 drop into both eyes at bedtime.    . OXYGEN Inhale into the lungs continuous.    . polyethylene glycol powder (GLYCOLAX/MIRALAX) powder Take 17 g by mouth daily. Mix 1 capful of powder and dissolve it in any liquid. Drink once daily. 255 g 0  . Probiotic Product (MISC INTESTINAL FLORA REGULAT) CAPS Take 1 capsule by mouth daily.      Marland Kitchen Propylene Glycol (SYSTANE BALANCE OP) Place 1 drop into both eyes as needed (dry and pain).    Marland Kitchen sertraline (ZOLOFT) 50 MG tablet Take 1 tablet (50 mg total) by mouth daily. 90 tablet 3  . sodium chloride (MURO 128) 5 % ophthalmic solution Place 1 drop into the left eye 3 (three) times daily.    . timolol (TIMOPTIC) 0.5 % ophthalmic solution Place 1 drop into both eyes 2 (two) times daily.     Marland Kitchen trimethoprim (TRIMPEX) 100 MG tablet Take 100 mg by mouth every other day.     Marland Kitchen VYTORIN 10-40 MG per tablet TAKE 1 TABLET ONCE DAILY. 90 tablet 3   No current facility-administered medications on file prior to visit.    Past Medical History  Diagnosis Date  . Hx of colonic polyps     last colonoscopy 2005, repeat was due 2010 Dr. Sharlett Iles  . Atrial fibrillation ([place])     Dr Percival Spanish  . Fracture 1995, 2009    RUE; LUE Dr. Durward Fortes  . 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 Cystitis   . Complication of anesthesia     difficulty with arousal post op  . Hyperlipidemia   . Hyperlipidemia   . Osteoporosis   .  Cerebrovascular accident ([place]) 01/09  . Hypothyroidism   . Dysrhythmia   . Spinal stenosis of lumbar region 01/10/2016  . Acute diastolic heart failure ([place]) 11/03/2015  . Aortic stenosis, mild 08/15/2014    Echo June, 2015, mild aortic stenosis   . Cardiorenal syndrome 11/05/2015  . Chronic anticoagulation 08/15/2014    Eliquis for A fib   . Chronic atrial fibrillation ([place]) 11/02/2015  . COPD, Gold grade A 04/08/2008    January 2016 pulmonary function testing> ratio 58% FEV1 1.83L (88% pred, no change with BD), TLC  5.78L (103% pred), DLCO 9.01 (31% pred) 07/2015 ONO RA > spO2 79% average 07/2015 ONO 2LPM> < 88% 189 min 08/2015 ONO 3LPM < 88% 69.8 min 09/2015 ONO 4LPM > OK   . History of CVA (cerebrovascular accident) 08/15/2014  . Squamous cell carcinoma of neck ([place]) 03/09/2014    3/15 Dr Wilhemina Bonito   . CAROTID STENOSIS 08/08/2009    Qualifier: Diagnosis of  By: Percival Spanish, MD, Farrel Gordon    . Depression 09/22/2014  . G E R D 06/02/2007    Qualifier: Diagnosis of  By: Linna Darner MD, William    . HYPERTENSION, ESSENTIAL NOS 12/01/2007    Qualifier: Diagnosis of  By: Linna Darner MD, Gwyndolyn Saxon    . T12 compression fracture [facility]) 11/02/2015    Past Surgical History  Procedure Laterality Date  . Tonsillectomy and adenoidectomy    . Vocal cord polyps    . Thyroid needle biopsy  2002  . Appendectomy    . Cataract extraction      [place]  . Colonoscopy w/ polypectomy  2005    Diverticulosis;Dr. Sharlett Iles  . Total hip arthroplasty  2008    CHF & RAF post op   . Total shoulder replacement  2010    Dr Durward Fortes - left shoulder  . Mohs surgery  2013    nasal Basal Cell; Dr Sarajane Jews  . Excision metacarpal mass Right 08/17/2013    Procedure: RIGHT LONG EXCISION MASS AND DIP JOINT DEBRIDEMENT;  Surgeon: Tennis Must, MD;  Location: [facility];  Service: Orthopedics;  Laterality: Right;  . Femoral hernia repair Right 08/17/2014    Procedure: OPEN REPAIR RIGHT FEMORAL HERNIA  WITH INSERTION OF MESH;   Surgeon: Adin Hector, MD;  Location: [facility];  Service: General;  Laterality: Right;  . Insertion of mesh  08/17/2014    Procedure: INSERTION OF MESH;  Surgeon: Adin Hector, MD;  Location: [facility];  Service: General;;  . Inguinal hernia repair Right 11/15/2014    Procedure: LAPAROSCOPIC RIGHT INGUINAL HERNIA WITH MESH;  Surgeon: Alphonsa Overall, MD;  Location: [facility];  Service: General;  Laterality: Right;    Social History   Social History  . Marital Status: Married    Spouse Name: N/A  . Number of Children: N/A  . Years of Education: N/A   Social History Main Topics  . Smoking status: Former Smoker -- 1.00 packs/day for 45 years    Types: Cigarettes    Quit date: 12/23/1993  . Smokeless tobacco: Never Used     Comment: Smoked 1950-1995, up to 1 ppd  . Alcohol Use: No  . Drug Use: No  . Sexual Activity: No   Other Topics Concern  . Not on file   Social History Narrative    Family History  Problem Relation Age of Onset  . Stroke Father     > 8  . Diabetes Father   . Leukemia Mother   . Cancer Mother     leukemia  . Colon cancer Brother     Valve replacement  . Heart attack Brother   . Breast cancer Sister   . Ovarian cancer Sister   . Lung cancer Sister     smoker  . Prostate cancer Brother   . Cancer Brother     bladder    Review of Systems  Constitutional: Positive for appetite change and fatigue. Negative for fever.  Respiratory: Positive for cough and shortness of breath. Negative for wheezing.   Cardiovascular: Positive  for palpitations (occasionally) and leg swelling (occasionally). Negative for chest pain.  Gastrointestinal: Negative for abdominal pain.       Occasional gerd  Musculoskeletal: Positive for back pain (lower back pain).  Neurological: Positive for light-headedness. Negative for headaches.       Poor balance  Psychiatric/Behavioral: Positive for dysphoric mood.       Objective:   Filed Vitals:   04/04/16 0908  BP: 106/52    Pulse: 66  Temp: 97.4 F (36.3 C)  Resp: 16   Filed Weights   04/04/16 0908  Weight: 126 lb (57.153 kg)   Body mass index is 19.73 kg/(m^2).   Physical Exam Constitutional: Appears well-developed and well-nourished. No distress.  Neck: Neck supple. No tracheal deviation present. No thyromegaly present.  No carotid bruit. No cervical adenopathy.   Cardiovascular: Normal rate, regular rhythm and normal heart sounds.   3/6 systolic murmur heard.  Trace b/l ankle edema Pulmonary/Chest: Effort normal and breath sounds normal. No respiratory distress. No wheezes.  Psych: depressed affect      Assessment & Plan:    See Problem List for Assessment and Plan of chronic medical problems.  F/u in 6 months

## 2016-04-04 NOTE — Patient Instructions (Signed)
   Medications reviewed and updated.  Changes include increasing the sertraline to 75 mg daily.  Your prescription(s) have been submitted to your pharmacy. Please take as directed and contact our office if you believe you are having problem(s) with the medication(s).   Please followup in 6 months

## 2016-04-04 NOTE — Assessment & Plan Note (Signed)
chronic afib On eliquis Rate controlled Following with cardiology once a year

## 2016-04-04 NOTE — Progress Notes (Signed)
Pre visit review using our clinic review tool, if applicable. No additional management support is needed unless otherwise documented below in the visit note. 

## 2016-04-04 NOTE — Assessment & Plan Note (Signed)
History of poor balance and falls - this in addition to fatigue with exertion she warrants a walker - this is medically necessary given her medical conditions Script given for a new walker At this point I do not think PT would be helpful

## 2016-04-04 NOTE — Assessment & Plan Note (Signed)
Not controlled Will try increasing zoloft to 75 mg daily

## 2016-05-10 ENCOUNTER — Telehealth: Payer: Self-pay | Admitting: Internal Medicine

## 2016-05-10 NOTE — Telephone Encounter (Signed)
Error

## 2016-05-16 ENCOUNTER — Ambulatory Visit: Payer: Medicare Other | Admitting: Pulmonary Disease

## 2016-05-17 ENCOUNTER — Ambulatory Visit (INDEPENDENT_AMBULATORY_CARE_PROVIDER_SITE_OTHER): Payer: Medicare Other | Admitting: Pulmonary Disease

## 2016-05-17 ENCOUNTER — Telehealth: Payer: Self-pay | Admitting: Pulmonary Disease

## 2016-05-17 ENCOUNTER — Ambulatory Visit (INDEPENDENT_AMBULATORY_CARE_PROVIDER_SITE_OTHER)
Admission: RE | Admit: 2016-05-17 | Discharge: 2016-05-17 | Disposition: A | Discharge status: Home / Self Care | Payer: Medicare Other | Source: Ambulatory Visit | Attending: Pulmonary Disease | Admitting: Pulmonary Disease

## 2016-05-17 ENCOUNTER — Encounter: Payer: Self-pay | Admitting: Pulmonary Disease

## 2016-05-17 ENCOUNTER — Other Ambulatory Visit: Payer: Self-pay | Admitting: Cardiology

## 2016-05-17 VITALS — BP 114/62 | HR 70 | Ht 67.0 in | Wt 122.0 lb

## 2016-05-17 DIAGNOSIS — R0902 Hypoxemia: Secondary | ICD-10-CM

## 2016-05-17 DIAGNOSIS — R0602 Shortness of breath: Secondary | ICD-10-CM

## 2016-05-17 DIAGNOSIS — J690 Pneumonitis due to inhalation of food and vomit: Secondary | ICD-10-CM

## 2016-05-17 DIAGNOSIS — R1314 Dysphagia, pharyngoesophageal phase: Secondary | ICD-10-CM

## 2016-05-17 MED ORDER — PREDNISONE 10 MG PO TABS: ORAL_TABLET | ORAL | Status: DC

## 2016-05-17 MED ORDER — AMOXICILLIN-POT CLAVULANATE 875-125 MG PO TABS: 1.0000 | ORAL_TABLET | Freq: Two times a day (BID) | ORAL | Status: DC

## 2016-05-17 NOTE — Telephone Encounter (Signed)
Notes Recorded by Juanito Doom, MD on 05/17/2016 at 1:56 PM A, Please let the patient know this showed pneumonia so she definitely needs to take the antibiotic and keep the follow up appointment with [place] in 2 weeks. Thanks, B  Called spoke with pt. Reviewed results and recs. Pt voiced understanding and had no further questions.

## 2016-05-17 NOTE — Assessment & Plan Note (Addendum)
Her hypoxemia has been worsening recently. I worry that this may be acutely because of an acute exacerbation of COPD versus acute aspiration pneumonitis. However, it is worrisome that her hypoxemia has been progressing fairly rapidly in the last year.  Plan: Use 4L rest and sleep, 5L with exertion. Treat COPD exacerbation and possible pneumonia with antibiotics and steroids Come back to clinic in 2 weeks, if oxygen needs is still elevated and she is still symptomatic then at that point I would recommend a pulmonary function test and a high-resolution CT scan to ensure there is nothing else going on (i.e. evidence of UIP or other interstitial lung disease).

## 2016-05-17 NOTE — Assessment & Plan Note (Signed)
She is having an exacerbation of her COPD. She is compliant with her Symbicort.  Plan: Prednisone taper Continue Symbicort Albuterol when necessary

## 2016-05-17 NOTE — Patient Instructions (Signed)
We will call you with the results of the chest x-ray Use 4 L of oxygen continuously, 5 L with exertion Take the prednisone taper as prescribed Take the antibiotic as prescribed I will see you back in 2 weeks with the nurse practitioner.

## 2016-05-17 NOTE — Assessment & Plan Note (Signed)
We have had long discussions with her about the dysphagia which I believe is the primary cause of her chronic respiratory failure hypoxemia.  She is not willing to consider anything other than basic swallowing changes and speech therapy.  Plan: antibiotics as detailed above.

## 2016-05-17 NOTE — Progress Notes (Signed)
Subjective:    Patient ID: Kristine Simmons, female    DOB: 05/26/1927, 82 y.o.   MRN: [id number]  Synopsis: GOLD GRADE A COPD With severe hypoxemia secondary to recurrent aspiration leading to basilar pulmonary scarring. January 2016 pulmonary function testing> ratio 58% FEV1 1.83L (88% pred, no change with BD), TLC 5.78L (103% pred), DLCO 9.01 (31% pred) 03/2015 CT chest> patulous esophagus, basilar scarring She has overnight hypoxemia and uses 4 L continuously.  HPI Chief Complaint  Patient presents with  . Follow-up    pt c/o increased weakness, sob with exertion, overall body aches X2-3 weeks.  denies cough, mucus production.      Kristine Simmons wants to know if she can wear her hearing aids with her oxygen. She says that she has been coughing a bit more lately.   She wants to get rid of the oxygen.  She says that she has noticed that her oxygen level has been dropping as low as 74% while she is wearing oxygen and doing something active.  She says that her oxygen level in the morning is frequently very low in the mornings.  She says she never takes her oxygen level off.  She sees her oxygen level drop as low as the 70's.  In the last few weeks she has been feeling breathless more often, but this hasn't always been the case.   She has been more fatigued and has no appetite.   In the last few weeks she has not had fever or chills.   She says that she coughs with every single meal and she brings up more mucus (lots) always by eating. She continues to take Symbicort regularly twice a week. She has been using albuterol more frequently in the last few weeks.   Past Medical History  Diagnosis Date  . Hx of colonic polyps     last colonoscopy 2005, repeat was due 2010 Dr. Sharlett Simmons  . Atrial fibrillation ([place])     Dr Kristine Simmons  . Fracture 1995, 2009    RUE; LUE Dr. Durward Simmons  . 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 Cystitis   . Complication of anesthesia     difficulty with  arousal post op  . Hyperlipidemia   . Hyperlipidemia   . Osteoporosis   . Cerebrovascular accident ([place]) 01/09  . Hypothyroidism   . Dysrhythmia   . Spinal stenosis of lumbar region 01/10/2016  . Acute diastolic heart failure ([place]) 11/03/2015  . Aortic stenosis, mild 08/15/2014    Echo June, 2015, mild aortic stenosis   . Cardiorenal syndrome 11/05/2015  . Chronic anticoagulation 08/15/2014    Eliquis for A fib   . Chronic atrial fibrillation ([place]) 11/02/2015  . COPD, Gold grade A 04/08/2008    January 2016 pulmonary function testing> ratio 58% FEV1 1.83L (88% pred, no change with BD), TLC 5.78L (103% pred), DLCO 9.01 (31% pred) 07/2015 ONO RA > spO2 79% average 07/2015 ONO 2LPM> < 88% 189 min 08/2015 ONO 3LPM < 88% 69.8 min 09/2015 ONO 4LPM > OK   . History of CVA (cerebrovascular accident) 08/15/2014  . Squamous cell carcinoma of neck ([place]) 03/09/2014    3/15 Dr Kristine Simmons   . CAROTID STENOSIS 08/08/2009    Qualifier: Diagnosis of  By: Kristine Spanish, MD, Kristine Simmons    . Depression 09/22/2014  . G E R D 06/02/2007    Qualifier: Diagnosis of  By: Kristine Darner MD, Kristine Simmons    . HYPERTENSION,  ESSENTIAL NOS 12/01/2007    Qualifier: Diagnosis of  By: Kristine Darner MD, Kristine Simmons    . T12 compression fracture ([place]) 11/02/2015      Review of Systems  Constitutional: Negative for fever, chills and fatigue.  HENT: Negative for rhinorrhea, sinus pressure and sneezing.   Respiratory: Positive for cough and shortness of breath. Negative for wheezing.   Cardiovascular: Negative for chest pain, palpitations and leg swelling.       Objective:   Physical Exam Filed Vitals:   05/17/16 1039  BP: 114/62  Pulse: 70  Height: 5\' 7"  (1.702 m)  Weight: 122 lb (55.339 kg)  SpO2: 89%  89% on 4L [place] on forehead probe 76% on 4L [place] on finger probe  Gen: well appearing, no acute distress HEENT: NCAT, EOMi, OP clear,  PULM: Scant crackles left base, normal effort CV: RRR, systolic murmur, JVD noted AB: BS+, soft, nontender,    Ext: warm, no edema, no clubbing, no cyanosis Derm: no rash or skin breakdown Neuro: A&Ox4, MAEW  Dr. Clayborn Simmons notes reviewed April 2016 CT chest images personally reviewed with the family in clinic today.      Assessment & Plan:   Aspiration pneumonia ([place]) This is a recurrent problem for her and with her worsening oxygenation lately I am concerned that she is getting more scarring in her lungs from aspiration.  Its not clear if her acute worsening in symptoms are due to another episode of pneumonia  Plan: CXR  Dysphagia, pharyngoesophageal phase We have had long discussions with her about the dysphagia which I believe is the primary cause of her chronic respiratory failure hypoxemia.  She is not willing to consider anything other than basic swallowing changes and speech therapy.  Plan: antibiotics as detailed above.  Hypoxemia Her hypoxemia has been worsening recently. I worry that this may be acutely because of an acute exacerbation of COPD versus acute aspiration pneumonitis. However, it is worrisome that her hypoxemia has been progressing fairly rapidly in the last year.  Plan: Treat COPD exacerbation and possible pneumonia with antibiotics and steroids Come back to clinic in 2 weeks, if oxygen needs is still elevated and she is still symptomatic then at that point I would recommend a pulmonary function test and a high-resolution CT scan to ensure there is nothing else going on (i.e. evidence of UIP or other interstitial lung disease).  COPD, Gold grade A She is having an exacerbation of her COPD. She is compliant with her Symbicort.  Plan: Prednisone taper Continue Symbicort Albuterol when necessary    Updated Medication List Outpatient Encounter Prescriptions as of 05/17/2016  Medication Sig  . albuterol (VENTOLIN HFA) 108 (90 BASE) MCG/ACT inhaler Inhale 2 puffs into the lungs every 4 (four) hours as needed for wheezing or shortness of breath.   Marland Kitchen apraclonidine  (IOPIDINE) 0.5 % ophthalmic solution Place 1 drop into the right eye every 12 (twelve) hours.  . budesonide-formoterol (SYMBICORT) 80-4.5 MCG/ACT inhaler Inhale 2 puffs into the lungs 2 (two) times daily.  . Cranberry (CRANBERRY CONCENTRATE) 500 MG CAPS Take 500 mg by mouth daily.   Marland Kitchen DIGOX 125 MCG tablet TAKE 1 TABLET ONCE DAILY. (Patient taking differently: TAKE 125 MCG BY MOUTH DAILY)  . diltiazem (CARDIZEM CD) 120 MG 24 hr capsule Take 1 capsule (120 mg total) by mouth daily.  Marland Kitchen ELIQUIS 2.5 MG TABS tablet TAKE 1 TABLET TWICE DAILY. (Patient taking differently: TAKE 2.5 MG BY MOUTH TWICE DAILY)  . ENSURE PLUS (ENSURE PLUS) LIQD TAKE  ONE CAN BY MOUTH ONCE DAILY  . estradiol (ESTRACE) 0.1 MG/GM vaginal cream Place 2 g vaginally 2 (two) times a week. On Wednesdays & Saturdays.  . furosemide (LASIX) 40 MG tablet Take 1 tablet (40 mg total) by mouth daily.  Marland Kitchen levothyroxine (SYNTHROID, LEVOTHROID) 50 MCG tablet TAKE 1 TABLET ONCE DAILY.  Marland Kitchen OXYGEN Inhale into the lungs continuous.  . polyethylene glycol powder (GLYCOLAX/MIRALAX) powder Take 17 g by mouth daily. Mix 1 capful of powder and dissolve it in any liquid. Drink once daily.  . Probiotic Product (MISC INTESTINAL FLORA REGULAT) CAPS Take 1 capsule by mouth daily.    Marland Kitchen Propylene Glycol (SYSTANE BALANCE OP) Place 1 drop into both eyes as needed (dry and pain).  Marland Kitchen sertraline (ZOLOFT) 25 MG tablet Take 1 tablet (25 mg total) by mouth daily. In addition to 50 mg daily for a total of 75 mg daily  . sertraline (ZOLOFT) 50 MG tablet Take 1 tablet (50 mg total) by mouth daily.  . timolol (TIMOPTIC) 0.5 % ophthalmic solution Place 1 drop into both eyes 2 (two) times daily.   Marland Kitchen trimethoprim (TRIMPEX) 100 MG tablet Take 100 mg by mouth every other day.   Marland Kitchen VYTORIN 10-40 MG per tablet TAKE 1 TABLET ONCE DAILY.  . [DISCONTINUED] LUMIGAN 0.01 % SOLN Place 1 drop into both eyes at bedtime. Reported on 05/17/2016  . [DISCONTINUED] sodium chloride (MURO 128) 5 %  ophthalmic solution Place 1 drop into the left eye 3 (three) times daily. Reported on 05/17/2016   No facility-administered encounter medications on file as of 05/17/2016.

## 2016-05-17 NOTE — Assessment & Plan Note (Signed)
This is a recurrent problem for her and with her worsening oxygenation lately I am concerned that she is getting more scarring in her lungs from aspiration.  Its not clear if her acute worsening in symptoms are due to another episode of pneumonia  Plan: CXR

## 2016-05-17 NOTE — Progress Notes (Signed)
Quick Note:  Called spoke with pt. Reviewed results and recs. Pt voiced understanding and had no further questions. ______ 

## 2016-05-27 ENCOUNTER — Other Ambulatory Visit: Payer: Self-pay | Admitting: Cardiology

## 2016-06-03 ENCOUNTER — Telehealth: Payer: Self-pay | Admitting: Acute Care

## 2016-06-03 ENCOUNTER — Ambulatory Visit (INDEPENDENT_AMBULATORY_CARE_PROVIDER_SITE_OTHER): Payer: Medicare Other | Admitting: Acute Care

## 2016-06-03 ENCOUNTER — Other Ambulatory Visit (INDEPENDENT_AMBULATORY_CARE_PROVIDER_SITE_OTHER): Payer: Medicare Other

## 2016-06-03 ENCOUNTER — Encounter: Payer: Self-pay | Admitting: Acute Care

## 2016-06-03 ENCOUNTER — Ambulatory Visit (INDEPENDENT_AMBULATORY_CARE_PROVIDER_SITE_OTHER)
Admission: RE | Admit: 2016-06-03 | Discharge: 2016-06-03 | Disposition: A | Discharge status: Home / Self Care | Payer: Medicare Other | Source: Ambulatory Visit | Attending: Acute Care | Admitting: Acute Care

## 2016-06-03 VITALS — BP 102/52 | HR 56 | Ht 67.0 in | Wt 121.0 lb

## 2016-06-03 DIAGNOSIS — Z8701 Personal history of pneumonia (recurrent): Secondary | ICD-10-CM

## 2016-06-03 DIAGNOSIS — R5383 Other fatigue: Secondary | ICD-10-CM

## 2016-06-03 DIAGNOSIS — J438 Other emphysema: Secondary | ICD-10-CM

## 2016-06-03 LAB — CBC WITH DIFFERENTIAL/PLATELET
BASOS PCT: 0.3 % (ref 0.0–3.0)
Basophils Absolute: 0 10*3/uL (ref 0.0–0.1)
EOS ABS: 0.3 10*3/uL (ref 0.0–0.7)
EOS PCT: 2 % (ref 0.0–5.0)
HCT: 45.1 % (ref 36.0–46.0)
Hemoglobin: 14.6 g/dL (ref 12.0–15.0)
LYMPHS ABS: 2.1 10*3/uL (ref 0.7–4.0)
Lymphocytes Relative: 16.2 % (ref 12.0–46.0)
MCHC: 32.5 g/dL (ref 30.0–36.0)
MCV: 80.9 fl (ref 78.0–100.0)
MONO ABS: 1.1 10*3/uL — AB (ref 0.1–1.0)
Monocytes Relative: 8.6 % (ref 3.0–12.0)
NEUTROS PCT: 72.9 % (ref 43.0–77.0)
Neutro Abs: 9.4 10*3/uL — ABNORMAL HIGH (ref 1.4–7.7)
Platelets: 203 10*3/uL (ref 150.0–400.0)
RBC: 5.57 Mil/uL — ABNORMAL HIGH (ref 3.87–5.11)
RDW: 16.8 % — AB (ref 11.5–15.5)
WBC: 12.9 10*3/uL — ABNORMAL HIGH (ref 4.0–10.5)

## 2016-06-03 NOTE — Progress Notes (Signed)
History of Present Illness Kristine Simmons is a 82 y.o. female with Gold Grade A COPD with severe hypoxemia 2/2 recurrent aspiration/ basilar pulmonanary scarring.  Synopsis: GOLD GRADE A COPD With severe hypoxemia secondary to recurrent aspiration leading to basilar pulmonary scarring. January 2016 pulmonary function testing> ratio 58% FEV1 1.83L (88% pred, no change with BD), TLC 5.78L (103% pred), DLCO 9.01 (31% pred) 03/2015 CT chest> patulous esophagus, basilar scarring She has overnight hypoxemia and uses 4 L continuously.  06/03/2016 2 week follow up [id number] from previous visit is as follows:  Plan: Treat COPD exacerbation and possible pneumonia with antibiotics and steroids Come back to clinic in 2 weeks, if oxygen needs is still elevated and she is still symptomatic then at that point I would recommend a pulmonary function test and a high-resolution CT scan to ensure there is nothing else going on (i.e. evidence of UIP or other interstitial lung disease).  Pt. Presents to the office today for follow up of right pneumonia concerning for aspiration etiology which was treated with antibiotics and steroids 2 weeks ago.She has not had to use her rescue inhaler since last visit. She is continuing to use her Symbicort 2 puffs twice daily as prescribed.She states she is doing better. She has not had desaturations. She is keeping record of her oxygen saturations and they have been in the 90's per her records.She has had swallow studies and she is taking frequent sips of water with swallow as she has been directed.She denies fever, chest pain, orthopnea,hemoptysis, leg or calf pain.We will check a CXR as she leaves today to make sure the pneumonia has resolved. She does state she has been fatigued.since she has had the pneumonia.We will also check a CBC and check HGB.   Tests:  CXR 07/03/2016:   IMPRESSION: Hyperinflation. Interval clearing of previously seen right basilar airspace  opacification.  06/03/16: HGB 14.6  CXR: 05/17/2016  IMPRESSION: 1. Mild patchy atelectasis or pneumonia at the right lung base. 2. Stable cardiomegaly and changes of COPD. 3. Mildly progressive T12 compression deformity.   Past medical hx Past Medical History  Diagnosis Date  . Hx of colonic polyps     last colonoscopy 2005, repeat was due 2010 Dr. Sharlett Iles  . Atrial fibrillation ([place])     Dr Percival Spanish  . Fracture 1995, 2009    RUE; LUE Dr. Durward Fortes  . 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 Cystitis   . Complication of anesthesia     difficulty with arousal post op  . Hyperlipidemia   . Hyperlipidemia   . Osteoporosis   . Cerebrovascular accident ([place]) 01/09  . Hypothyroidism   . Dysrhythmia   . Spinal stenosis of lumbar region 01/10/2016  . Acute diastolic heart failure ([place]) 11/03/2015  . Aortic stenosis, mild 08/15/2014    Echo June, 2015, mild aortic stenosis   . Cardiorenal syndrome 11/05/2015  . Chronic anticoagulation 08/15/2014    Eliquis for A fib   . Chronic atrial fibrillation ([place]) 11/02/2015  . COPD, Gold grade A 04/08/2008    January 2016 pulmonary function testing> ratio 58% FEV1 1.83L (88% pred, no change with BD), TLC 5.78L (103% pred), DLCO 9.01 (31% pred) 07/2015 ONO RA > spO2 79% average 07/2015 ONO 2LPM> < 88% 189 min 08/2015 ONO 3LPM < 88% 69.8 min 09/2015 ONO 4LPM > OK   . History of CVA (cerebrovascular accident) 08/15/2014  . Squamous cell carcinoma of neck ([place]) 03/09/2014  3/15 Dr Wilhemina Bonito   . CAROTID STENOSIS 08/08/2009    Qualifier: Diagnosis of  By: Percival Spanish, MD, Farrel Gordon    . Depression 09/22/2014  . G E R D 06/02/2007    Qualifier: Diagnosis of  By: Linna Darner MD, William    . HYPERTENSION, ESSENTIAL NOS 12/01/2007    Qualifier: Diagnosis of  By: Linna Darner MD, Gwyndolyn Saxon    . T12 compression fracture [facility]) 11/02/2015     Past surgical hx, Family hx, Social hx all reviewed.  Current Outpatient Prescriptions on File Prior to  Visit  Medication Sig  . albuterol (VENTOLIN HFA) 108 (90 BASE) MCG/ACT inhaler Inhale 2 puffs into the lungs every 4 (four) hours as needed for wheezing or shortness of breath.   Marland Kitchen apraclonidine (IOPIDINE) 0.5 % ophthalmic solution Place 1 drop into the right eye every 12 (twelve) hours.  . budesonide-formoterol (SYMBICORT) 80-4.5 MCG/ACT inhaler Inhale 2 puffs into the lungs 2 (two) times daily.  . Cranberry (CRANBERRY CONCENTRATE) 500 MG CAPS Take 500 mg by mouth daily.   Marland Kitchen DIGOX 125 MCG tablet TAKE 1 TABLET ONCE DAILY. (Patient taking differently: TAKE 125 MCG BY MOUTH DAILY)  . diltiazem (CARDIZEM CD) 120 MG 24 hr capsule TAKE (1) CAPSULE DAILY.  Marland Kitchen ELIQUIS 2.5 MG TABS tablet TAKE 1 TABLET TWICE DAILY.  Marland Kitchen ENSURE PLUS (ENSURE PLUS) LIQD TAKE ONE CAN BY MOUTH ONCE DAILY  . estradiol (ESTRACE) 0.1 MG/GM vaginal cream Place 2 g vaginally 2 (two) times a week. On Wednesdays & Saturdays.  . furosemide (LASIX) 40 MG tablet Take 1 tablet (40 mg total) by mouth daily.  Marland Kitchen levothyroxine (SYNTHROID, LEVOTHROID) 50 MCG tablet TAKE 1 TABLET ONCE DAILY.  Marland Kitchen OXYGEN Inhale into the lungs continuous.  . polyethylene glycol powder (GLYCOLAX/MIRALAX) powder Take 17 g by mouth daily. Mix 1 capful of powder and dissolve it in any liquid. Drink once daily.  . Probiotic Product (MISC INTESTINAL FLORA REGULAT) CAPS Take 1 capsule by mouth daily.    Marland Kitchen Propylene Glycol (SYSTANE BALANCE OP) Place 1 drop into both eyes as needed (dry and pain).  Marland Kitchen sertraline (ZOLOFT) 25 MG tablet Take 1 tablet (25 mg total) by mouth daily. In addition to 50 mg daily for a total of 75 mg daily  . sertraline (ZOLOFT) 50 MG tablet Take 1 tablet (50 mg total) by mouth daily.  . timolol (TIMOPTIC) 0.5 % ophthalmic solution Place 1 drop into both eyes 2 (two) times daily.   Marland Kitchen trimethoprim (TRIMPEX) 100 MG tablet Take 100 mg by mouth every other day.   Marland Kitchen VYTORIN 10-40 MG per tablet TAKE 1 TABLET ONCE DAILY.   No current facility-administered  medications on file prior to visit.     Allergies  Allergen Reactions  . Hydrocodone Nausea And Vomiting  . Desmopressin Acetate     REACTION: swelling feet  . Dorzolamide Hcl Other (See Comments)    Burning in eyes and redness  . Prempro [Conj Estrog-Medroxyprogest Ace]     unknown  . Ciprofloxacin     REACTION: vaginal infection    Review Of Systems:  Constitutional:   No  weight loss, night sweats,  Fevers, chills, fatigue, or  lassitude.  HEENT:   No headaches,  Difficulty swallowing,  Tooth/dental problems, or  Sore throat,                No sneezing, itching, ear ache, nasal congestion, post nasal drip,   CV:  No chest pain,  Orthopnea, PND, swelling  in lower extremities, anasarca, dizziness, palpitations, syncope.   GI  No heartburn, indigestion, abdominal pain, nausea, vomiting, diarrhea, change in bowel habits, loss of appetite, bloody stools.   Resp: + shortness of breath with exertion not at rest.  No excess mucus, no productive cough,  No non-productive cough,  No coughing up of blood.  No change in color of mucus.  No wheezing.  No chest wall deformity  Skin: no rash or lesions.  GU: no dysuria, change in color of urine, no urgency or frequency.  No flank pain, no hematuria   MS:  No joint pain or swelling.  No decreased range of motion.  No back pain.  Psych:  No change in mood or affect. No depression or anxiety.  No memory loss.   Vital Signs BP 102/52 mmHg  Pulse 56  Ht 5\' 7"  (1.702 m)  Wt 121 lb (54.885 kg)  BMI 18.95 kg/m2  SpO2 91%   Physical Exam:  General- No distress,  A&Ox3, frail, elderly ENT: No sinus tenderness, TM clear, pale nasal mucosa, no oral exudate,no post nasal drip, no LAN Cardiac: S1, S2, regular rate and rhythm, no murmur Chest: No wheeze/ rales/ diminished bilaterally; no accessory muscle use, no nasal flaring, no sternal retractions Abd.: Soft Non-tender Ext: No clubbing cyanosis, edema Neuro:  Baseline strength/ uses  walker Skin: No rashes, warm and dry Psych: normal mood and behavior, frustrated that she will not be able to get rid of oxygen   Assessment/Plan  COPD, Gold grade A COPD exacerbation has resolved Clearing of CXR Plan: Continue using your Symbicort 2 puffs twice daily as you are doing. Remember to rinse your mouth after use. Continue wearing your oxygen at 4 L  As needed to maintain your oxygen level at 88-92% Continue using your rescue inhaler as needed for shortness of breath or wheezing. We will check a CXR on your way out today. Lab work today.( CBC) to check your blood count. We will call you with the results. Follow up appointment in 12 weeks. Please contact office for sooner follow up if symptoms do not improve or worsen or seek emergency care       Magdalen Spatz, NP 06/03/2016  4:48 PM

## 2016-06-03 NOTE — Assessment & Plan Note (Signed)
COPD exacerbation has resolved Clearing of CXR Plan: Continue using your Symbicort 2 puffs twice daily as you are doing. Remember to rinse your mouth after use. Continue wearing your oxygen at 4 L  As needed to maintain your oxygen level at 88-92% Continue using your rescue inhaler as needed for shortness of breath or wheezing. We will check a CXR on your way out today. Lab work today.( CBC) to check your blood count. We will call you with the results. Follow up appointment in 12 weeks. Please contact office for sooner follow up if symptoms do not improve or worsen or seek emergency care

## 2016-06-03 NOTE — Patient Instructions (Addendum)
It is nice to meet you today. Continue using your Symbicort 2 puffs twice daily as you are doing. Remember to rinse your mouth after use. Continue wearing your oxygen at 4 L  As needed to maintain your oxygen level at 88-92% Continue using your rescue inhaler as needed for shortness of breath or wheezing. We will check a CXR on your way out today. Lab work today.( CBC) to check your blood count. We will call you with the results. Follow up appointment in 12 weeks. Please contact office for sooner follow up if symptoms do not improve or worsen or seek emergency care

## 2016-06-03 NOTE — Telephone Encounter (Signed)
Please call Ms. Talamante and let her know her CXR  And CBC were normal . Thanks so much.

## 2016-06-03 NOTE — Telephone Encounter (Signed)
I called to give patient her results of CXR and CBC. There was no answer. Will have Triage call in the morning.CXR was normal as was CBC.

## 2016-06-04 NOTE — Progress Notes (Signed)
Chart reviewed, I agree with this plan of care.

## 2016-06-04 NOTE — Telephone Encounter (Signed)
Spoke with pt and gave results. Nothing further needed.  

## 2016-06-10 ENCOUNTER — Inpatient Hospital Stay (HOSPITAL_COMMUNITY)
Admission: EM | Admit: 2016-06-10 | Discharge: 2016-06-13 | DRG: 177 | Disposition: A | Discharge status: Home / Self Care | Payer: Medicare Other | Attending: Pulmonary Disease | Admitting: Pulmonary Disease

## 2016-06-10 ENCOUNTER — Other Ambulatory Visit: Payer: Self-pay

## 2016-06-10 ENCOUNTER — Emergency Department (HOSPITAL_COMMUNITY): Payer: Medicare Other

## 2016-06-10 ENCOUNTER — Other Ambulatory Visit: Payer: Self-pay | Admitting: Internal Medicine

## 2016-06-10 ENCOUNTER — Encounter (HOSPITAL_COMMUNITY): Payer: Self-pay

## 2016-06-10 ENCOUNTER — Telehealth: Payer: Self-pay | Admitting: Pulmonary Disease

## 2016-06-10 DIAGNOSIS — I350 Nonrheumatic aortic (valve) stenosis: Secondary | ICD-10-CM

## 2016-06-10 DIAGNOSIS — Z8249 Family history of ischemic heart disease and other diseases of the circulatory system: Secondary | ICD-10-CM

## 2016-06-10 DIAGNOSIS — J449 Chronic obstructive pulmonary disease, unspecified: Secondary | ICD-10-CM | POA: Diagnosis present

## 2016-06-10 DIAGNOSIS — Z7901 Long term (current) use of anticoagulants: Secondary | ICD-10-CM | POA: Diagnosis not present

## 2016-06-10 DIAGNOSIS — J9621 Acute and chronic respiratory failure with hypoxia: Secondary | ICD-10-CM | POA: Diagnosis present

## 2016-06-10 DIAGNOSIS — E785 Hyperlipidemia, unspecified: Secondary | ICD-10-CM

## 2016-06-10 DIAGNOSIS — Z9981 Dependence on supplemental oxygen: Secondary | ICD-10-CM | POA: Diagnosis not present

## 2016-06-10 DIAGNOSIS — R0603 Acute respiratory distress: Secondary | ICD-10-CM

## 2016-06-10 DIAGNOSIS — J189 Pneumonia, unspecified organism: Secondary | ICD-10-CM | POA: Diagnosis present

## 2016-06-10 DIAGNOSIS — N390 Urinary tract infection, site not specified: Secondary | ICD-10-CM | POA: Diagnosis present

## 2016-06-10 DIAGNOSIS — I5031 Acute diastolic (congestive) heart failure: Secondary | ICD-10-CM

## 2016-06-10 DIAGNOSIS — R059 Cough, unspecified: Secondary | ICD-10-CM

## 2016-06-10 DIAGNOSIS — M810 Age-related osteoporosis without current pathological fracture: Secondary | ICD-10-CM | POA: Diagnosis present

## 2016-06-10 DIAGNOSIS — Z79899 Other long term (current) drug therapy: Secondary | ICD-10-CM

## 2016-06-10 DIAGNOSIS — I5032 Chronic diastolic (congestive) heart failure: Secondary | ICD-10-CM | POA: Diagnosis present

## 2016-06-10 DIAGNOSIS — Z8673 Personal history of transient ischemic attack (TIA), and cerebral infarction without residual deficits: Secondary | ICD-10-CM

## 2016-06-10 DIAGNOSIS — R42 Dizziness and giddiness: Secondary | ICD-10-CM

## 2016-06-10 DIAGNOSIS — R1314 Dysphagia, pharyngoesophageal phase: Secondary | ICD-10-CM

## 2016-06-10 DIAGNOSIS — R7989 Other specified abnormal findings of blood chemistry: Secondary | ICD-10-CM

## 2016-06-10 DIAGNOSIS — R05 Cough: Secondary | ICD-10-CM

## 2016-06-10 DIAGNOSIS — R945 Abnormal results of liver function studies: Secondary | ICD-10-CM

## 2016-06-10 DIAGNOSIS — E041 Nontoxic single thyroid nodule: Secondary | ICD-10-CM

## 2016-06-10 DIAGNOSIS — Y95 Nosocomial condition: Secondary | ICD-10-CM | POA: Diagnosis present

## 2016-06-10 DIAGNOSIS — K4031 Unilateral inguinal hernia, with obstruction, without gangrene, recurrent: Secondary | ICD-10-CM

## 2016-06-10 DIAGNOSIS — E039 Hypothyroidism, unspecified: Secondary | ICD-10-CM | POA: Diagnosis present

## 2016-06-10 DIAGNOSIS — Z833 Family history of diabetes mellitus: Secondary | ICD-10-CM

## 2016-06-10 DIAGNOSIS — M48061 Spinal stenosis, lumbar region without neurogenic claudication: Secondary | ICD-10-CM

## 2016-06-10 DIAGNOSIS — I110 Hypertensive heart disease with heart failure: Secondary | ICD-10-CM | POA: Diagnosis present

## 2016-06-10 DIAGNOSIS — Z87891 Personal history of nicotine dependence: Secondary | ICD-10-CM

## 2016-06-10 DIAGNOSIS — Z66 Do not resuscitate: Secondary | ICD-10-CM | POA: Diagnosis present

## 2016-06-10 DIAGNOSIS — Z860100 Personal history of colon polyps, unspecified: Secondary | ICD-10-CM

## 2016-06-10 DIAGNOSIS — J690 Pneumonitis due to inhalation of food and vomit: Secondary | ICD-10-CM | POA: Diagnosis present

## 2016-06-10 DIAGNOSIS — J432 Centrilobular emphysema: Secondary | ICD-10-CM | POA: Diagnosis not present

## 2016-06-10 DIAGNOSIS — I071 Rheumatic tricuspid insufficiency: Secondary | ICD-10-CM

## 2016-06-10 DIAGNOSIS — R0902 Hypoxemia: Secondary | ICD-10-CM

## 2016-06-10 DIAGNOSIS — Z7189 Other specified counseling: Secondary | ICD-10-CM

## 2016-06-10 DIAGNOSIS — Z8701 Personal history of pneumonia (recurrent): Secondary | ICD-10-CM | POA: Diagnosis not present

## 2016-06-10 DIAGNOSIS — J9601 Acute respiratory failure with hypoxia: Secondary | ICD-10-CM

## 2016-06-10 DIAGNOSIS — H409 Unspecified glaucoma: Secondary | ICD-10-CM | POA: Diagnosis present

## 2016-06-10 DIAGNOSIS — F329 Major depressive disorder, single episode, unspecified: Secondary | ICD-10-CM

## 2016-06-10 DIAGNOSIS — C4442 Squamous cell carcinoma of skin of scalp and neck: Secondary | ICD-10-CM

## 2016-06-10 DIAGNOSIS — H540 Blindness, both eyes: Secondary | ICD-10-CM | POA: Diagnosis present

## 2016-06-10 DIAGNOSIS — Z515 Encounter for palliative care: Secondary | ICD-10-CM

## 2016-06-10 DIAGNOSIS — J438 Other emphysema: Secondary | ICD-10-CM | POA: Diagnosis present

## 2016-06-10 DIAGNOSIS — F32A Depression, unspecified: Secondary | ICD-10-CM

## 2016-06-10 DIAGNOSIS — I482 Chronic atrial fibrillation, unspecified: Secondary | ICD-10-CM | POA: Diagnosis present

## 2016-06-10 DIAGNOSIS — B9620 Unspecified Escherichia coli [E. coli] as the cause of diseases classified elsewhere: Secondary | ICD-10-CM | POA: Diagnosis present

## 2016-06-10 DIAGNOSIS — J9691 Respiratory failure, unspecified with hypoxia: Secondary | ICD-10-CM | POA: Diagnosis not present

## 2016-06-10 DIAGNOSIS — J9612 Chronic respiratory failure with hypercapnia: Secondary | ICD-10-CM | POA: Diagnosis not present

## 2016-06-10 DIAGNOSIS — I272 Pulmonary hypertension, unspecified: Secondary | ICD-10-CM

## 2016-06-10 DIAGNOSIS — I10 Essential (primary) hypertension: Secondary | ICD-10-CM

## 2016-06-10 DIAGNOSIS — I4891 Unspecified atrial fibrillation: Secondary | ICD-10-CM | POA: Diagnosis present

## 2016-06-10 LAB — MAGNESIUM: MAGNESIUM: 1.9 mg/dL (ref 1.7–2.4)

## 2016-06-10 LAB — I-STAT TROPONIN, ED: TROPONIN I, POC: 0.03 ng/mL (ref 0.00–0.08)

## 2016-06-10 LAB — URINALYSIS, ROUTINE W REFLEX MICROSCOPIC
BILIRUBIN URINE: NEGATIVE
Glucose, UA: NEGATIVE mg/dL
Hgb urine dipstick: NEGATIVE
Ketones, ur: NEGATIVE mg/dL
NITRITE: NEGATIVE
PH: 7.5 (ref 5.0–8.0)
Protein, ur: NEGATIVE mg/dL
SPECIFIC GRAVITY, URINE: 1.006 (ref 1.005–1.030)

## 2016-06-10 LAB — BLOOD GAS, ARTERIAL
ACID-BASE EXCESS: 7.9 mmol/L — AB (ref 0.0–2.0)
Acid-Base Excess: 7.7 mmol/L — ABNORMAL HIGH (ref 0.0–2.0)
BICARBONATE: 32 meq/L — AB (ref 20.0–24.0)
Bicarbonate: 32 mEq/L — ABNORMAL HIGH (ref 20.0–24.0)
Drawn by: 295031
Drawn by: 295031
FIO2: 0.55
O2 CONTENT: 8 L/min
O2 SAT: 85.3 %
O2 Saturation: 85.5 %
PATIENT TEMPERATURE: 98.6
PATIENT TEMPERATURE: 98.6
PCO2 ART: 44.2 mmHg (ref 35.0–45.0)
PCO2 ART: 43.2 mmHg (ref 35.0–45.0)
PO2 ART: 52.4 mmHg — AB (ref 80.0–100.0)
PO2 ART: 51.6 mmHg — AB (ref 80.0–100.0)
TCO2: 27.9 mmol/L (ref 0–100)
TCO2: 28 mmol/L (ref 0–100)
pH, Arterial: 7.473 — ABNORMAL HIGH (ref 7.350–7.450)
pH, Arterial: 7.482 — ABNORMAL HIGH (ref 7.350–7.450)

## 2016-06-10 LAB — PROCALCITONIN: Procalcitonin: <0.1 ng/mL

## 2016-06-10 LAB — CBC WITH DIFFERENTIAL/PLATELET
BASOS PCT: 1 %
Basophils Absolute: 0.1 10*3/uL (ref 0.0–0.1)
Eosinophils Absolute: 0.1 10*3/uL (ref 0.0–0.7)
Eosinophils Relative: 2 %
HCT: 44.4 % (ref 36.0–46.0)
Hemoglobin: 14.3 g/dL (ref 12.0–15.0)
LYMPHS ABS: 1.3 10*3/uL (ref 0.7–4.0)
Lymphocytes Relative: 19 %
MCH: 26.5 pg (ref 26.0–34.0)
MCHC: 32.2 g/dL (ref 30.0–36.0)
MCV: 82.4 fL (ref 78.0–100.0)
MONO ABS: 0.6 10*3/uL (ref 0.1–1.0)
MONOS PCT: 8 %
NEUTROS PCT: 70 %
Neutro Abs: 4.8 10*3/uL (ref 1.7–7.7)
PLATELETS: 179 10*3/uL (ref 150–400)
RBC: 5.39 MIL/uL — AB (ref 3.87–5.11)
RDW: 17.1 % — ABNORMAL HIGH (ref 11.5–15.5)
WBC: 6.9 10*3/uL (ref 4.0–10.5)

## 2016-06-10 LAB — BASIC METABOLIC PANEL
Anion gap: 7 (ref 5–15)
BUN: 21 mg/dL — ABNORMAL HIGH (ref 6–20)
CALCIUM: 9.2 mg/dL (ref 8.9–10.3)
CHLORIDE: 96 mmol/L — AB (ref 101–111)
CO2: 35 mmol/L — AB (ref 22–32)
CREATININE: 1.11 mg/dL — AB (ref 0.44–1.00)
GFR calc non Af Amer: 43 mL/min — ABNORMAL LOW (ref >60)
GFR, EST AFRICAN AMERICAN: 50 mL/min — AB (ref >60)
GLUCOSE: 123 mg/dL — AB (ref 65–99)
Potassium: 3.5 mmol/L (ref 3.5–5.1)
Sodium: 138 mmol/L (ref 135–145)

## 2016-06-10 LAB — URINE MICROSCOPIC-ADD ON

## 2016-06-10 LAB — MRSA PCR SCREENING: MRSA by PCR: NEGATIVE

## 2016-06-10 LAB — LACTIC ACID, PLASMA: LACTIC ACID, VENOUS: 1.2 mmol/L (ref 0.5–2.0)

## 2016-06-10 LAB — PHOSPHORUS: Phosphorus: 3.7 mg/dL (ref 2.5–4.6)

## 2016-06-10 LAB — BRAIN NATRIURETIC PEPTIDE: B Natriuretic Peptide: 734.1 pg/mL — ABNORMAL HIGH (ref 0.0–100.0)

## 2016-06-10 LAB — TROPONIN I
Troponin I: 0.04 ng/mL — ABNORMAL HIGH (ref <0.031)
Troponin I: 0.03 ng/mL (ref <0.031)

## 2016-06-10 LAB — CORTISOL: Cortisol, Plasma: 7.5 ug/dL

## 2016-06-10 MED ORDER — PIPERACILLIN-TAZOBACTAM 3.375 G IVPB 30 MIN
3.3750 g | INTRAVENOUS | Status: AC
  Administered 2016-06-10: 3.375 g via INTRAVENOUS
  Filled 2016-06-10: qty 50

## 2016-06-10 MED ORDER — PROBIOTIC PRODUCT PO CAPS: 1.0000 | ORAL_CAPSULE | Freq: Every day | ORAL | Status: DC

## 2016-06-10 MED ORDER — APRACLONIDINE HCL 0.5 % OP SOLN
1.0000 [drp] | Freq: Two times a day (BID) | OPHTHALMIC | Status: DC
  Administered 2016-06-10 – 2016-06-13 (×6): 1 [drp] via OPHTHALMIC
  Filled 2016-06-10: qty 5

## 2016-06-10 MED ORDER — FUROSEMIDE 40 MG PO TABS: 40.0000 mg | ORAL_TABLET | Freq: Every day | ORAL | Status: DC

## 2016-06-10 MED ORDER — ESTRADIOL 0.1 MG/GM VA CREA: 2.0000 g | TOPICAL_CREAM | VAGINAL | Status: DC

## 2016-06-10 MED ORDER — DILTIAZEM HCL ER COATED BEADS 120 MG PO CP24
120.0000 mg | ORAL_CAPSULE | Freq: Every day | ORAL | Status: DC
  Administered 2016-06-11 – 2016-06-13 (×3): 120 mg via ORAL
  Filled 2016-06-10 (×3): qty 1

## 2016-06-10 MED ORDER — LEVOTHYROXINE SODIUM 50 MCG PO TABS
50.0000 ug | ORAL_TABLET | Freq: Every day | ORAL | Status: DC
  Administered 2016-06-11 – 2016-06-13 (×3): 50 ug via ORAL
  Filled 2016-06-10 (×4): qty 1

## 2016-06-10 MED ORDER — DIGOXIN 125 MCG PO TABS
0.1250 mg | ORAL_TABLET | Freq: Every day | ORAL | Status: DC
  Administered 2016-06-11 – 2016-06-13 (×3): 0.125 mg via ORAL
  Filled 2016-06-10 (×3): qty 1

## 2016-06-10 MED ORDER — ALBUTEROL SULFATE (2.5 MG/3ML) 0.083% IN NEBU
5.0000 mg | INHALATION_SOLUTION | Freq: Once | RESPIRATORY_TRACT | Status: AC
  Administered 2016-06-10: 5 mg via RESPIRATORY_TRACT
  Filled 2016-06-10: qty 6

## 2016-06-10 MED ORDER — RISAQUAD PO CAPS
1.0000 | ORAL_CAPSULE | Freq: Every day | ORAL | Status: DC
  Administered 2016-06-11 – 2016-06-13 (×3): 1 via ORAL
  Filled 2016-06-10 (×3): qty 1

## 2016-06-10 MED ORDER — CETYLPYRIDINIUM CHLORIDE 0.05 % MT LIQD
7.0000 mL | Freq: Two times a day (BID) | OROMUCOSAL | Status: DC
  Administered 2016-06-10 – 2016-06-11 (×2): 7 mL via OROMUCOSAL

## 2016-06-10 MED ORDER — DEXTROSE 5 % IV SOLN: 2.0000 g | Freq: Once | INTRAVENOUS | Status: DC

## 2016-06-10 MED ORDER — LEVOTHYROXINE SODIUM 50 MCG PO TABS: 50.0000 ug | ORAL_TABLET | Freq: Every day | ORAL | Status: DC

## 2016-06-10 MED ORDER — POLYETHYLENE GLYCOL 3350 17 G PO PACK
17.0000 g | PACK | Freq: Every day | ORAL | Status: DC
  Administered 2016-06-11: 17 g via ORAL
  Filled 2016-06-10 (×4): qty 1

## 2016-06-10 MED ORDER — DILTIAZEM HCL ER COATED BEADS 120 MG PO CP24: 120.0000 mg | ORAL_CAPSULE | Freq: Every day | ORAL | Status: DC

## 2016-06-10 MED ORDER — DEXTROSE 5 % IV SOLN
1.0000 g | Freq: Once | INTRAVENOUS | Status: DC
  Administered 2016-06-10: 1 g via INTRAVENOUS
  Filled 2016-06-10: qty 10

## 2016-06-10 MED ORDER — DEXTROSE 5 % IV SOLN
500.0000 mg | Freq: Once | INTRAVENOUS | Status: AC
  Administered 2016-06-10: 500 mg via INTRAVENOUS
  Filled 2016-06-10: qty 500

## 2016-06-10 MED ORDER — IPRATROPIUM-ALBUTEROL 0.5-2.5 (3) MG/3ML IN SOLN
3.0000 mL | Freq: Four times a day (QID) | RESPIRATORY_TRACT | Status: DC
Start: 2016-06-10 — End: 2016-06-13
  Administered 2016-06-10 – 2016-06-13 (×11): 3 mL via RESPIRATORY_TRACT
  Filled 2016-06-10 (×12): qty 3

## 2016-06-10 MED ORDER — ESTRADIOL 0.1 MG/GM VA CREA
2.0000 g | TOPICAL_CREAM | VAGINAL | Status: DC
  Administered 2016-06-12: 2 g via VAGINAL
  Filled 2016-06-10: qty 42.5

## 2016-06-10 MED ORDER — FUROSEMIDE 40 MG PO TABS
40.0000 mg | ORAL_TABLET | Freq: Every day | ORAL | Status: DC
  Administered 2016-06-11 – 2016-06-13 (×3): 40 mg via ORAL
  Filled 2016-06-10 (×3): qty 1

## 2016-06-10 MED ORDER — SODIUM CHLORIDE 0.9 % IV SOLN: 250.0000 mL | INTRAVENOUS | Status: DC | PRN

## 2016-06-10 MED ORDER — TIMOLOL MALEATE 0.5 % OP SOLN
1.0000 [drp] | Freq: Two times a day (BID) | OPHTHALMIC | Status: DC
  Administered 2016-06-10 – 2016-06-13 (×6): 1 [drp] via OPHTHALMIC
  Filled 2016-06-10: qty 5

## 2016-06-10 MED ORDER — TRIMETHOPRIM 100 MG PO TABS
100.0000 mg | ORAL_TABLET | ORAL | Status: DC
  Administered 2016-06-12: 100 mg via ORAL
  Filled 2016-06-10: qty 1

## 2016-06-10 MED ORDER — ALBUTEROL SULFATE (2.5 MG/3ML) 0.083% IN NEBU: 3.0000 mL | INHALATION_SOLUTION | RESPIRATORY_TRACT | Status: DC | PRN

## 2016-06-10 MED ORDER — METHYLPREDNISOLONE SODIUM SUCC 125 MG IJ SOLR: 60.0000 mg | Freq: Four times a day (QID) | INTRAMUSCULAR | Status: DC

## 2016-06-10 MED ORDER — SERTRALINE HCL 50 MG PO TABS
75.0000 mg | ORAL_TABLET | Freq: Every day | ORAL | Status: DC
  Administered 2016-06-11 – 2016-06-13 (×3): 75 mg via ORAL
  Filled 2016-06-10: qty 2
  Filled 2016-06-10 (×2): qty 1

## 2016-06-10 MED ORDER — PIPERACILLIN-TAZOBACTAM 3.375 G IVPB
3.3750 g | Freq: Three times a day (TID) | INTRAVENOUS | Status: DC
  Administered 2016-06-10 – 2016-06-12 (×5): 3.375 g via INTRAVENOUS
  Filled 2016-06-10 (×7): qty 50

## 2016-06-10 MED ORDER — HEPARIN SODIUM (PORCINE) 5000 UNIT/ML IJ SOLN: 5000.0000 [IU] | Freq: Three times a day (TID) | INTRAMUSCULAR | Status: DC

## 2016-06-10 MED ORDER — TIMOLOL MALEATE 0.5 % OP SOLN: 1.0000 [drp] | Freq: Two times a day (BID) | OPHTHALMIC | Status: DC

## 2016-06-10 MED ORDER — EZETIMIBE 10 MG PO TABS
10.0000 mg | ORAL_TABLET | Freq: Every day | ORAL | Status: DC
  Administered 2016-06-11 – 2016-06-12 (×2): 10 mg via ORAL
  Filled 2016-06-10 (×3): qty 1

## 2016-06-10 MED ORDER — ENSURE PLUS PO LIQD: 1.0000 | Freq: Two times a day (BID) | ORAL | Status: DC

## 2016-06-10 MED ORDER — SERTRALINE HCL 50 MG PO TABS: 25.0000 mg | ORAL_TABLET | Freq: Every day | ORAL | Status: DC

## 2016-06-10 MED ORDER — VANCOMYCIN HCL IN DEXTROSE 1-5 GM/200ML-% IV SOLN
1000.0000 mg | Freq: Once | INTRAVENOUS | Status: DC
  Filled 2016-06-10: qty 200

## 2016-06-10 MED ORDER — ENSURE ENLIVE PO LIQD
237.0000 mL | Freq: Two times a day (BID) | ORAL | Status: DC
  Administered 2016-06-11 – 2016-06-13 (×4): 237 mL via ORAL

## 2016-06-10 MED ORDER — DEXTROSE 5 % IV SOLN
1.0000 g | Freq: Three times a day (TID) | INTRAVENOUS | Status: DC
  Filled 2016-06-10: qty 1

## 2016-06-10 MED ORDER — METHYLPREDNISOLONE SODIUM SUCC 125 MG IJ SOLR
60.0000 mg | Freq: Two times a day (BID) | INTRAMUSCULAR | Status: DC
  Administered 2016-06-10 – 2016-06-11 (×2): 60 mg via INTRAVENOUS
  Filled 2016-06-10 (×2): qty 2

## 2016-06-10 MED ORDER — APIXABAN 2.5 MG PO TABS: 2.5000 mg | ORAL_TABLET | Freq: Two times a day (BID) | ORAL | Status: DC

## 2016-06-10 MED ORDER — VANCOMYCIN HCL 500 MG IV SOLR: 500.0000 mg | INTRAVENOUS | Status: DC

## 2016-06-10 MED ORDER — DEXTROSE 5 % IV SOLN
500.0000 mg | INTRAVENOUS | Status: DC
  Administered 2016-06-11: 500 mg via INTRAVENOUS
  Filled 2016-06-10 (×2): qty 500

## 2016-06-10 MED ORDER — APIXABAN 2.5 MG PO TABS
2.5000 mg | ORAL_TABLET | Freq: Two times a day (BID) | ORAL | Status: DC
  Administered 2016-06-10 – 2016-06-13 (×6): 2.5 mg via ORAL
  Filled 2016-06-10 (×7): qty 1

## 2016-06-10 MED ORDER — EZETIMIBE-SIMVASTATIN 10-40 MG PO TABS: 1.0000 | ORAL_TABLET | Freq: Every day | ORAL | Status: DC

## 2016-06-10 MED ORDER — DIGOXIN 125 MCG PO TABS: 0.1250 mg | ORAL_TABLET | Freq: Every day | ORAL | Status: DC

## 2016-06-10 MED ORDER — ATORVASTATIN CALCIUM 20 MG PO TABS
20.0000 mg | ORAL_TABLET | Freq: Every day | ORAL | Status: DC
  Administered 2016-06-11 – 2016-06-12 (×2): 20 mg via ORAL
  Filled 2016-06-10 (×4): qty 1

## 2016-06-10 NOTE — ED Notes (Signed)
Bed: RESB Expected date:  Expected time:  Means of arrival:  Comments: 53F/hypoxia

## 2016-06-10 NOTE — ED Provider Notes (Signed)
CSN: [id number]     Arrival date & time 06/10/16  1320 History   First MD Initiated Contact with Patient 06/10/16 1328     Chief Complaint  Patient presents with  . Shortness of Breath  . decreased O2 Sats      (Consider location/radiation/quality/duration/timing/severity/associated sxs/prior Treatment) HPI  82 year old female presents with hypoxia and shortness of breath. Patient tells me that she is chronically on 4 L of oxygen with O2 sats in the high 80s. Over the last couple days she's noticed fashion saturations in the 70s, as low as 71%. Has had increased shortness of breath. Has chronic cough but it is not worse than typical. No fevers or chest pain. Has noticed bilateral lower extremity swelling recently as well. Looking in her legs now she states her leg swelling is actually a little bit improved. Sent from pulmonology office where her sats were reportedly in the 61s.  Past Medical History  Diagnosis Date  . Hx of colonic polyps     last colonoscopy 2005, repeat was due 2010 Dr. Sharlett Iles  . Atrial fibrillation ([place])     Dr Percival Spanish  . Fracture 1995, 2009    RUE; LUE Dr. Durward Fortes  . 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 Cystitis   . Complication of anesthesia     difficulty with arousal post op  . Hyperlipidemia   . Hyperlipidemia   . Osteoporosis   . Cerebrovascular accident ([place]) 01/09  . Hypothyroidism   . Dysrhythmia   . Spinal stenosis of lumbar region 01/10/2016  . Acute diastolic heart failure ([place]) 11/03/2015  . Aortic stenosis, mild 08/15/2014    Echo June, 2015, mild aortic stenosis   . Cardiorenal syndrome 11/05/2015  . Chronic anticoagulation 08/15/2014    Eliquis for A fib   . Chronic atrial fibrillation ([place]) 11/02/2015  . COPD, Gold grade A 04/08/2008    January 2016 pulmonary function testing> ratio 58% FEV1 1.83L (88% pred, no change with BD), TLC 5.78L (103% pred), DLCO 9.01 (31% pred) 07/2015 ONO RA > spO2 79% average 07/2015 ONO  2LPM> < 88% 189 min 08/2015 ONO 3LPM < 88% 69.8 min 09/2015 ONO 4LPM > OK   . History of CVA (cerebrovascular accident) 08/15/2014  . Squamous cell carcinoma of neck ([place]) 03/09/2014    3/15 Dr Wilhemina Bonito   . CAROTID STENOSIS 08/08/2009    Qualifier: Diagnosis of  By: Percival Spanish, MD, Farrel Gordon    . Depression 09/22/2014  . G E R D 06/02/2007    Qualifier: Diagnosis of  By: Linna Darner MD, William    . HYPERTENSION, ESSENTIAL NOS 12/01/2007    Qualifier: Diagnosis of  By: Linna Darner MD, Gwyndolyn Saxon    . T12 compression fracture [facility]) 11/02/2015   Past Surgical History  Procedure Laterality Date  . Tonsillectomy and adenoidectomy    . Vocal cord polyps    . Thyroid needle biopsy  2002  . Appendectomy    . Cataract extraction      Bilat  . Colonoscopy w/ polypectomy  2005    Diverticulosis;Dr. Sharlett Iles  . Total hip arthroplasty  2008    CHF & RAF post op   . Total shoulder replacement  2010    Dr Durward Fortes - left shoulder  . Mohs surgery  2013    nasal Basal Cell; Dr Sarajane Jews  . Excision metacarpal mass Right 08/17/2013    Procedure: RIGHT LONG EXCISION MASS AND DIP JOINT DEBRIDEMENT;  Surgeon: Lennette Bihari  Beola Cord, MD;  Location: [place];  Service: Orthopedics;  Laterality: Right;  . Femoral hernia repair Right 08/17/2014    Procedure: OPEN REPAIR RIGHT FEMORAL HERNIA  WITH INSERTION OF MESH;  Surgeon: Adin Hector, MD;  Location: [facility];  Service: General;  Laterality: Right;  . Insertion of mesh  08/17/2014    Procedure: INSERTION OF MESH;  Surgeon: Adin Hector, MD;  Location: [facility];  Service: General;;  . Inguinal hernia repair Right 11/15/2014    Procedure: LAPAROSCOPIC RIGHT INGUINAL HERNIA WITH MESH;  Surgeon: Alphonsa Overall, MD;  Location: [facility];  Service: General;  Laterality: Right;   Family History  Problem Relation Age of Onset  . Stroke Father     > 58  . Diabetes Father   . Leukemia Mother   . Cancer Mother     leukemia  . Colon cancer Brother     Valve  replacement  . Heart attack Brother   . Breast cancer Sister   . Ovarian cancer Sister   . Lung cancer Sister     smoker  . Prostate cancer Brother   . Cancer Brother     bladder   Social History  Substance Use Topics  . Smoking status: Former Smoker -- 1.00 packs/day for 45 years    Types: Cigarettes    Quit date: 12/23/1993  . Smokeless tobacco: Never Used     Comment: Smoked 1950-1995, up to 1 ppd  . Alcohol Use: No   OB History    No data available     Review of Systems  Constitutional: Negative for fever.  Respiratory: Positive for cough and shortness of breath.   Cardiovascular: Positive for leg swelling. Negative for chest pain.  All other systems reviewed and are negative.     Allergies  Hydrocodone; Desmopressin acetate; Dorzolamide hcl; Prempro; and Ciprofloxacin  Home Medications   Prior to Admission medications   Medication Sig Start Date End Date Taking? Authorizing Provider  albuterol (VENTOLIN HFA) 108 (90 BASE) MCG/ACT inhaler Inhale 2 puffs into the lungs every 4 (four) hours as needed for wheezing or shortness of breath.     Historical Provider, MD  apraclonidine (IOPIDINE) 0.5 % ophthalmic solution Place 1 drop into the right eye every 12 (twelve) hours.    Historical Provider, MD  budesonide-formoterol (SYMBICORT) 80-4.5 MCG/ACT inhaler Inhale 2 puffs into the lungs 2 (two) times daily. 02/02/16   Juanito Doom, MD  Cranberry (CRANBERRY CONCENTRATE) 500 MG CAPS Take 500 mg by mouth daily.     Historical Provider, MD  [place] 125 MCG tablet TAKE 1 TABLET ONCE DAILY. Patient taking differently: TAKE 125 MCG BY MOUTH DAILY 05/26/15   Minus Breeding, MD  diltiazem (CARDIZEM CD) 120 MG 24 hr capsule TAKE (1) CAPSULE DAILY. 05/27/16   Minus Breeding, MD  ELIQUIS 2.5 MG TABS tablet TAKE 1 TABLET TWICE DAILY. 05/18/16   Minus Breeding, MD  ENSURE PLUS (ENSURE PLUS) LIQD TAKE ONE CAN BY MOUTH ONCE DAILY 02/13/16   Binnie Rail, MD  estradiol (ESTRACE) 0.1 MG/GM  vaginal cream Place 2 g vaginally 2 (two) times a week. On Wednesdays & Saturdays.    Historical Provider, MD  furosemide (LASIX) 40 MG tablet Take 1 tablet (40 mg total) by mouth daily. 01/10/16   Biagio Borg, MD  levothyroxine (SYNTHROID, LEVOTHROID) 50 MCG tablet TAKE 1 TABLET ONCE DAILY. 03/28/16   Binnie Rail, MD  OXYGEN Inhale into the lungs continuous.  Historical Provider, MD  polyethylene glycol powder (GLYCOLAX/MIRALAX) powder Take 17 g by mouth daily. Mix 1 capful of powder and dissolve it in any liquid. Drink once daily. 10/30/15   Forde Dandy, MD  Probiotic Product (MISC INTESTINAL FLORA REGULAT) CAPS Take 1 capsule by mouth daily.      Historical Provider, MD  Propylene Glycol (SYSTANE BALANCE OP) Place 1 drop into both eyes as needed (dry and pain).    Historical Provider, MD  sertraline (ZOLOFT) 25 MG tablet Take 1 tablet (25 mg total) by mouth daily. In addition to 50 mg daily for a total of 75 mg daily 04/04/16   Binnie Rail, MD  sertraline (ZOLOFT) 50 MG tablet Take 1 tablet (50 mg total) by mouth daily. 01/10/16   Biagio Borg, MD  timolol (TIMOPTIC) 0.5 % ophthalmic solution Place 1 drop into both eyes 2 (two) times daily.     Historical Provider, MD  trimethoprim (TRIMPEX) 100 MG tablet Take 100 mg by mouth every other day.     Historical Provider, MD  VYTORIN 10-40 MG per tablet TAKE 1 TABLET ONCE DAILY. 05/15/15   Hendricks Limes, MD   BP 111/81 mmHg  Pulse 65  Temp(Src) 97.5 F (36.4 C) (Oral)  Resp 19  SpO2 94% Physical Exam  Constitutional: She is oriented to person, place, and time. She appears well-developed and well-nourished. No distress.  No acute distress, speaks in clear, complete sentences without increased WOB  HENT:  Head: Normocephalic and atraumatic.  Right Ear: External ear normal.  Left Ear: External ear normal.  Nose: Nose normal.  Eyes: Right eye exhibits no discharge. Left eye exhibits no discharge.  Cardiovascular: Normal rate, regular rhythm  and normal heart sounds.   Pulmonary/Chest: Effort normal and breath sounds normal. She has no wheezes. She has no rales.  Abdominal: Soft. She exhibits no distension. There is no tenderness.  Musculoskeletal:  Trace pedal edema bilaterally  Neurological: She is alert and oriented to person, place, and time.  Skin: Skin is warm and dry. She is not diaphoretic.  Nursing note and vitals reviewed.   ED Course  Procedures (including critical care time) Labs Review Labs Reviewed  BLOOD GAS, ARTERIAL - Abnormal; Notable for the following:    pH, Arterial 7.473 (*)    pO2, Arterial 52.4 (*)    Bicarbonate 32.0 (*)    Acid-Base Excess 7.7 (*)    All other components within normal limits  BASIC METABOLIC PANEL - Abnormal; Notable for the following:    Chloride 96 (*)    CO2 35 (*)    Glucose, Bld 123 (*)    BUN 21 (*)    Creatinine, Ser 1.11 (*)    GFR calc non Af Amer 43 (*)    GFR calc Af Amer 50 (*)    All other components within normal limits  CBC WITH DIFFERENTIAL/PLATELET - Abnormal; Notable for the following:    RBC 5.39 (*)    RDW 17.1 (*)    All other components within normal limits  BRAIN NATRIURETIC PEPTIDE - Abnormal; Notable for the following:    B Natriuretic Peptide 734.1 (*)    All other components within normal limits  TROPONIN I - Abnormal; Notable for the following:    Troponin I 0.04 (*)    All other components within normal limits  URINALYSIS, ROUTINE W REFLEX MICROSCOPIC (NOT AT [facility]) - Abnormal; Notable for the following:    Leukocytes, UA LARGE (*)  All other components within normal limits  BLOOD GAS, ARTERIAL - Abnormal; Notable for the following:    pH, Arterial 7.482 (*)    pO2, Arterial 51.6 (*)    Bicarbonate 32.0 (*)    Acid-Base Excess 7.9 (*)    All other components within normal limits  URINE MICROSCOPIC-ADD ON - Abnormal; Notable for the following:    Squamous Epithelial / LPF 6-30 (*)    Bacteria, UA MANY (*)    All other components  within normal limits  MRSA PCR SCREENING  CULTURE, BLOOD (ROUTINE X 2)  CULTURE, BLOOD (ROUTINE X 2)  URINE CULTURE  CULTURE, RESPIRATORY (NON-EXPECTORATED)  MAGNESIUM  PHOSPHORUS  TROPONIN I  LACTIC ACID, PLASMA  PROCALCITONIN  CORTISOL  TROPONIN I  LEGIONELLA PNEUMOPHILA SEROGP 1 UR AG  STREP PNEUMONIAE URINARY ANTIGEN  CBC  I-STAT TROPOININ, ED    Imaging Review Dg Chest Portable 1 View  06/10/2016  CLINICAL DATA:  82 year old female with a history of low oxygen saturation EXAM: PORTABLE CHEST 1 VIEW COMPARISON:  06/03/2016, 05/11/2016 FINDINGS: Cardiomediastinal silhouette unchanged in size and contour. Atherosclerosis of the aortic arch. Stigmata of emphysema, with flattened hemidiaphragms, and hyperinflation on the AP view. Worsening mixed interstitial and airspace opacities of the bilateral lungs, worst on the right compared to the prior. Blunting of the left costophrenic angle and obscuration left hemidiaphragm. Calcifications of the aortic arch. No pneumothorax. Surgical changes of left shoulder arthroplasty. IMPRESSION: Advanced emphysema with developing right greater than left mixed interstitial and airspace opacities. Differential includes multifocal pneumonia with developing left parapneumonic effusion, or potentially edema and pleural effusion. Atherosclerosis. Signed, Dulcy Fanny. Earleen Newport, DO [facility] Radiology Electronically Signed   By: Corrie Mckusick D.O.   On: 06/10/2016 14:11   I have personally reviewed and evaluated these images and lab results as part of my medical decision-making.   EKG Interpretation   Date/Time:  Monday June 10 2016 14:10:36 EDT Ventricular Rate:  62 PR Interval:    QRS Duration: 73 QT Interval:  377 QTC Calculation: 383 R Axis:   88 Text Interpretation:  Atrial fibrillation Ventricular premature complex  LVH with secondary repolarization abnormality Probable anterior infarct,  age  indeterminate no significant change since Nov 2016 Confirmed by  Regenia Skeeter MD, [place] [phone]) on 06/10/2016 2:13:22 PM      MDM   Final diagnoses:  Hypoxia  Recurrent pneumonia    Patient does not appear in distress but has significant hypoxia worse than baseline. She is speaking to me in complete sentences but her x-ray does show multiple opacities concerning for multifocal pneumonia. Will cover with antibiotics including vancomycin. Pharmacy recommends cefepime instead of Rocephin to help cover for Pseudomonas given COPD history. Given degree of oxygen requirement she will be admitted to the ICU. However at this point she is speaking clearly and is in no significant distress that would warrant intubation or further airway management.    Sherwood Gambler, MD 06/10/16 [phone]

## 2016-06-10 NOTE — Progress Notes (Signed)
Pharmacy Antibiotic Note  BERINA GIESEKING is a 82 y.o. female admitted on 06/10/2016 with COPD exacerbation possibly secondary to recurrent aspiration.  Pharmacy has been consulted for Zosyn dosing.  Recently treated as outpatient with PO abx for exacerbation by PCCM in May 2017.  Plan:  Zosyn 3.375 g IV given once over 30 minutes, then every 8 hrs by 4-hr infusion  Azithromycin per MD     Temp (24hrs), Avg:97.5 F (36.4 C), Min:97.5 F (36.4 C), Max:97.5 F (36.4 C)   Recent Labs Lab 06/10/16 1356  WBC 6.9  CREATININE 1.11*    Estimated Creatinine Clearance: 30.9 mL/min (by C-G formula based on Cr of 1.11).    Allergies  Allergen Reactions  . Hydrocodone Nausea And Vomiting  . Desmopressin Acetate     REACTION: swelling feet  . Dorzolamide Hcl Other (See Comments)    Burning in eyes and redness  . Prempro [Conj Estrog-Medroxyprogest Ace]     unknown  . Ciprofloxacin     REACTION: vaginal infection    Antimicrobials this admission: 6/19 Rocephin x 1 6/19 Zosyn >> 6/19 Azithromycin >>  Dose adjustments this admission: ---  Microbiology results: 6/19 BCx: sent 6/19 UCx: sent  6/19 trach aspirate  Negative for MRSA colonization in 10/2015  Thank you for allowing pharmacy to be a part of this patient's care.  Reuel Boom, PharmD, BCPS Pager: [phone] 06/10/2016, 5:53 PM

## 2016-06-10 NOTE — Telephone Encounter (Addendum)
Pt walked in c/o increased SOB and "just don't feel good." Pt sats were 67% on 4lpm pulsed on her portable concentrator, BP 116/66 Pt denied any tightness, wheezing, increased cough, mucus production, f/c/s Again reiterated that she did not feel well Her sats at home had been running in the low- to mid-70's all morning Recommended pt go to the ER for immediate evaluation  Placed pt on 4L continuous and sats did not rise, increased to 6L and sats rose to 74%.  Increased pt to 8L continuous and sats hovered between 81%-83%.  Patient then placed on non-rebreather 15L  Discussed with 2 providers within the office and they agreed - ER for evaluation by EMS transport Non-rebreather at [place] = 92% EMS called and pt transported to [facility] ED  Will sign and route to BQ as FYI

## 2016-06-10 NOTE — ED Notes (Signed)
Per EMS- patient  Was at the Pulmonologist's office and had Sats in the 60's. Pulmonology office started O2 4L/min via [place] Sats increased to 80's. EMS placed a NRB and sats increased to 94%. Lunjgs clear.

## 2016-06-10 NOTE — Progress Notes (Signed)
ANTICOAGULATION CONSULT NOTE - Initial Consult  Pharmacy Consult for Eliquis Indication: atrial fibrillation  Allergies  Allergen Reactions  . Hydrocodone Nausea And Vomiting  . Desmopressin Acetate     REACTION: swelling feet  . Dorzolamide Hcl Other (See Comments)    Burning in eyes and redness  . Prempro [Conj Estrog-Medroxyprogest Ace]     unknown  . Ciprofloxacin     REACTION: vaginal infection    Patient Measurements:    Vital Signs: Temp: 97.5 F (36.4 C) (06/19 1342) Temp Source: Oral (06/19 1342) BP: 124/76 mmHg (06/19 1734) Pulse Rate: 60 (06/19 1734)  Labs:  Recent Labs  06/10/16 1356 06/10/16 1727  HGB 14.3  --   HCT 44.4  --   PLT 179  --   CREATININE 1.11*  --   TROPONINI  --  0.04*    Estimated Creatinine Clearance: 30.9 mL/min (by C-G formula based on Cr of 1.11).   Medical History: Past Medical History  Diagnosis Date  . Hx of colonic polyps     last colonoscopy 2005, repeat was due 2010 Dr. Sharlett Iles  . Atrial fibrillation ([place])     Dr Percival Spanish  . Fracture 1995, 2009    RUE; LUE Dr. Durward Fortes  . 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 Cystitis   . Complication of anesthesia     difficulty with arousal post op  . Hyperlipidemia   . Hyperlipidemia   . Osteoporosis   . Cerebrovascular accident ([place]) 01/09  . Hypothyroidism   . Dysrhythmia   . Spinal stenosis of lumbar region 01/10/2016  . Acute diastolic heart failure ([place]) 11/03/2015  . Aortic stenosis, mild 08/15/2014    Echo June, 2015, mild aortic stenosis   . Cardiorenal syndrome 11/05/2015  . Chronic anticoagulation 08/15/2014    Eliquis for A fib   . Chronic atrial fibrillation ([place]) 11/02/2015  . COPD, Gold grade A 04/08/2008    January 2016 pulmonary function testing> ratio 58% FEV1 1.83L (88% pred, no change with BD), TLC 5.78L (103% pred), DLCO 9.01 (31% pred) 07/2015 ONO RA > spO2 79% average 07/2015 ONO 2LPM> < 88% 189 min 08/2015 ONO 3LPM < 88% 69.8 min  09/2015 ONO 4LPM > OK   . History of CVA (cerebrovascular accident) 08/15/2014  . CAROTID STENOSIS 08/08/2009    Qualifier: Diagnosis of  By: Percival Spanish, MD, Farrel Gordon    . Depression 09/22/2014  . G E R D 06/02/2007    Qualifier: Diagnosis of  By: Linna Darner MD, William    . HYPERTENSION, ESSENTIAL NOS 12/01/2007    Qualifier: Diagnosis of  By: Linna Darner MD, Gwyndolyn Saxon    . T12 compression fracture ([place]) 11/02/2015  . Squamous cell carcinoma of neck ([place]) 03/09/2014    3/15 Dr Wilhemina Bonito     Medications:  PTA Eliquis 2.5 mg bid, last dose this morning at 0900  Assessment: 5 yoF admitted for COPD vs aspiration PNA.  PMH Afib on low-dose Eliquis (Age > 80 and Wt < 60 kg).  To resume per pharmacy while admitted   CBC, SCr wnl  Goal of Therapy:  Prevent thromboembolic event   Plan:   Resume Eliquis per home dosing to start tonight  F/u CBC, SCr  Reuel Boom, PharmD, BCPS Pager: [phone] 06/10/2016, 6:30 PM

## 2016-06-10 NOTE — H&P (Signed)
PULMONARY / CRITICAL CARE MEDICINE   Name: Kristine Simmons MRN: [id number] DOB: Sep 29, 1927    ADMISSION DATE:  06/10/2016 CONSULTATION DATE:  06/10/16  REFERRING MD:  ED  CHIEF COMPLAINT: Dyspnea, hypoxic resp failure   HISTORY OF PRESENT ILLNESS:   82 Y/O with H/O chronic atrial fibrillation on this chronic respiratory failure secondary to COPD, reccurent aspiration. She was seen in clinic in May 2017 for COPD exacerbation treated with antibiotics and steroids. She was sent in today to ED from the pulmonary clinic for increased dyspnea, hypoxemia.  In the ED she was noted to have sats of 90 in 50% FM  She states that for 2-3 days prior to admission she felt fatigued, mild increased shortness of breath and hypoxemia worse in the mornings. She said that she denies a change in her baseline cough. No chest pain, no fevers or chills. She said she's noted some back pain with long-standing. Some leg swelling recently but this has not been worse in the last few days.  She came to the pulmonary office today and her O2 saturation note were less than 80% on nasal cannula 8LPM so she was sent to the ER.   PAST MEDICAL HISTORY :  She  has a past medical history of colonic polyps; Atrial fibrillation ([place]); Fracture (1995, 2009); Blindness; Cystitis; Complication of anesthesia; Hyperlipidemia; Hyperlipidemia; Osteoporosis; Cerebrovascular accident [facility]) (01/09); Hypothyroidism; Dysrhythmia; Spinal stenosis of lumbar region (01/10/2016); Acute diastolic heart failure ([place]) (11/03/2015); Aortic stenosis, mild (08/15/2014); Cardiorenal syndrome (11/05/2015); Chronic anticoagulation (08/15/2014); Chronic atrial fibrillation ([facility]) (11/02/2015); COPD, Gold grade A (04/08/2008); History of CVA (cerebrovascular accident) (08/15/2014); Squamous cell carcinoma of neck ([place]) (03/09/2014); CAROTID STENOSIS (08/08/2009); Depression (09/22/2014); G E R D (06/02/2007); HYPERTENSION, ESSENTIAL NOS (12/01/2007); and T12 compression  fracture ([place]) (11/02/2015).  PAST SURGICAL HISTORY: She  has past surgical history that includes Tonsillectomy and adenoidectomy; Vocal Cord Polyps; Thyroid Needle Biopsy (2002); Appendectomy; Cataract extraction; Colonoscopy w/ polypectomy (2005); Total hip arthroplasty (2008); Total shoulder replacement (2010); Mohs surgery (2013); Excision metacarpal mass (Right, 08/17/2013); Femoral hernia repair (Right, 08/17/2014); Insertion of mesh (08/17/2014); and Inguinal hernia repair (Right, 11/15/2014).  Allergies  Allergen Reactions  . Hydrocodone Nausea And Vomiting  . Desmopressin Acetate     REACTION: swelling feet  . Dorzolamide Hcl Other (See Comments)    Burning in eyes and redness  . Prempro [Conj Estrog-Medroxyprogest Ace]     unknown  . Ciprofloxacin     REACTION: vaginal infection    No current facility-administered medications on file prior to encounter.   Current Outpatient Prescriptions on File Prior to Encounter  Medication Sig  . albuterol (VENTOLIN HFA) 108 (90 BASE) MCG/ACT inhaler Inhale 2 puffs into the lungs every 4 (four) hours as needed for wheezing or shortness of breath.   Marland Kitchen apraclonidine (IOPIDINE) 0.5 % ophthalmic solution Place 1 drop into the right eye every 12 (twelve) hours.  . budesonide-formoterol (SYMBICORT) 80-4.5 MCG/ACT inhaler Inhale 2 puffs into the lungs 2 (two) times daily.  Marland Kitchen DIGOX 125 MCG tablet TAKE 1 TABLET ONCE DAILY. (Patient taking differently: TAKE 125 MCG BY MOUTH DAILY)  . diltiazem (CARDIZEM CD) 120 MG 24 hr capsule TAKE (1) CAPSULE DAILY. (Patient taking differently: TAKE  120 MG BY MOUTH  DAILY.)  . ELIQUIS 2.5 MG TABS tablet TAKE 1 TABLET TWICE DAILY. (Patient taking differently: TAKE 2.5 MG BY MOUTH TWICE DAILY.)  . ENSURE PLUS (ENSURE PLUS) LIQD TAKE ONE CAN BY MOUTH ONCE DAILY  . estradiol (ESTRACE) 0.1  MG/GM vaginal cream Place 2 g vaginally 2 (two) times a week. On Wednesdays & Saturdays.  . furosemide (LASIX) 40 MG tablet Take 1  tablet (40 mg total) by mouth daily.  Marland Kitchen levothyroxine (SYNTHROID, LEVOTHROID) 50 MCG tablet TAKE 1 TABLET ONCE DAILY. (Patient taking differently: TAKE 50 MCG BY MOUTH ONCE DAILY)  . OXYGEN Inhale into the lungs continuous.  . polyethylene glycol powder (GLYCOLAX/MIRALAX) powder Take 17 g by mouth daily. Mix 1 capful of powder and dissolve it in any liquid. Drink once daily.  . Probiotic Product (MISC INTESTINAL FLORA REGULAT) CAPS Take 1 capsule by mouth daily.    Marland Kitchen Propylene Glycol (SYSTANE BALANCE OP) Place 1 drop into both eyes as needed (dry and pain).  Marland Kitchen sertraline (ZOLOFT) 25 MG tablet Take 1 tablet (25 mg total) by mouth daily. In addition to 50 mg daily for a total of 75 mg daily  . sertraline (ZOLOFT) 50 MG tablet Take 1 tablet (50 mg total) by mouth daily.  . timolol (TIMOPTIC) 0.5 % ophthalmic solution Place 1 drop into both eyes 2 (two) times daily.   Marland Kitchen trimethoprim (TRIMPEX) 100 MG tablet Take 100 mg by mouth every other day.   Marland Kitchen VYTORIN 10-40 MG per tablet TAKE 1 TABLET ONCE DAILY.    FAMILY HISTORY:  Her has no family status information on file.   SOCIAL HISTORY: She  reports that she quit smoking about 22 years ago. Her smoking use included Cigarettes. She has a 45 pack-year smoking history. She has never used smokeless tobacco. She reports that she does not drink alcohol or use illicit drugs.  REVIEW OF SYSTEMS:   Gen: Denies fever, chills, weight change, + fatigue, night sweats HEENT: Denies blurred vision, double vision, hearing loss, tinnitus, sinus congestion, rhinorrhea, sore throat, neck stiffness, dysphagia PULM: per HPI CV: Denies chest pain, edema, orthopnea, paroxysmal nocturnal dyspnea, palpitations GI: Denies abdominal pain, nausea, vomiting, diarrhea, hematochezia, melena, constipation, change in bowel habits GU: Denies dysuria, hematuria, polyuria, oliguria, urethral discharge Endocrine: Denies hot or cold intolerance, polyuria, polyphagia or appetite  change Derm: Denies rash, dry skin, scaling or peeling skin change Heme: Denies easy bruising, bleeding, bleeding gums Neuro: Denies headache, numbness, weakness, slurred speech, loss of memory or consciousness   SUBJECTIVE:  Feels as detailed above  VITAL SIGNS: BP 127/70 mmHg  Pulse 46  Temp(Src) 97.5 F (36.4 C) (Oral)  Resp 15  SpO2 89%  HEMODYNAMICS:    VENTILATOR SETTINGS:    INTAKE / OUTPUT:    PHYSICAL EXAMINATION: General:  Elderly female, no distress, good spirits Neuro:  Awake alert, oriented 4 HEENT:  Normocephalic/atraumatic, no JVD Cardiovascular:  Irregularly irregular, rate controlled Lungs:  Crackles left base, none on right, normal respiratory effort Abdomen:  Bowel sounds positive, nontender nondistended Musculoskeletal:  Normal bulk and tone Skin:  No rash or skin breakdown, no edema  LABS:  BMET  Recent Labs Lab 06/10/16 1356  NA 138  K 3.5  CL 96*  CO2 35*  BUN 21*  CREATININE 1.11*  GLUCOSE 123*    Electrolytes  Recent Labs Lab 06/10/16 1356  CALCIUM 9.2    CBC  Recent Labs Lab 06/10/16 1356  WBC 6.9  HGB 14.3  HCT 44.4  PLT 179    Coag's No results for input(s): APTT, INR in the last 168 hours.  Sepsis Markers No results for input(s): LATICACIDVEN, PROCALCITON, O2SATVEN in the last 168 hours.  ABG  Recent Labs Lab 06/10/16 1356  PHART 7.473*  PCO2ART 44.2  PO2ART 52.4*    Liver Enzymes No results for input(s): AST, ALT, ALKPHOS, BILITOT, ALBUMIN in the last 168 hours.  Cardiac Enzymes No results for input(s): TROPONINI, PROBNP in the last 168 hours.  Glucose No results for input(s): GLUCAP in the last 168 hours.  Imaging Dg Chest Portable 1 View  06/10/2016  CLINICAL DATA:  82 year old female with a history of low oxygen saturation EXAM: PORTABLE CHEST 1 VIEW COMPARISON:  06/03/2016, 05/11/2016 FINDINGS: Cardiomediastinal silhouette unchanged in size and contour. Atherosclerosis of the aortic  arch. Stigmata of emphysema, with flattened hemidiaphragms, and hyperinflation on the AP view. Worsening mixed interstitial and airspace opacities of the bilateral lungs, worst on the right compared to the prior. Blunting of the left costophrenic angle and obscuration left hemidiaphragm. Calcifications of the aortic arch. No pneumothorax. Surgical changes of left shoulder arthroplasty. IMPRESSION: Advanced emphysema with developing right greater than left mixed interstitial and airspace opacities. Differential includes multifocal pneumonia with developing left parapneumonic effusion, or potentially edema and pleural effusion. Atherosclerosis. Signed, Dulcy Fanny. Earleen Newport, DO [facility] Radiology Electronically Signed   By: Corrie Mckusick D.O.   On: 06/10/2016 14:11     STUDIES:  Chest x-ray images personally reviewed showing interstitial infiltrate in the right, significant disease in the  CULTURES: 06/10/2016 blood culture  ANTIBIOTICS: 06/10/2016 Zosyn 06/10/2016 azithromycin  SIGNIFICANT EVENTS: 06/10/2016 admission  LINES/TUBES:   DISCUSSION: This is an 82 year old female with him I'm very familiar from the office he has advanced emphysema and a poorly understood interstitial fibrosis which is felt to be related to chronic aspiration who presents with yet another case of acute on chronic respiratory failure with hypoxemia. I believe the most likely etiology is aspiration pneumonia given the distribution of abnormality on her chest x-ray findings and her clinical history. Fortunately, she is quite comfortable at this time but she has high oxygen needs it she will need to be admitted to the intensive care unit.  ASSESSMENT / PLAN:  PULMONARY A: Acute on chronic respiratory failure with hypoxemia Aspiration pneumonia Advanced COPD, possible exacerbation P:   Continue bronchodilators with DuoNeb Solu-Medrol twice a day Titrate O2 to  maintain O2 saturation greater than 85% Treat pneumonia as detailed below  CARDIOVASCULAR A:  Chronic A. fib P:  Continue digoxin Continue diltiazem Continue Eliquis Telemetry monitoring  RENAL A:   No acute issues P:   Monitor BMET and UOP Replace electrolytes as needed   GASTROINTESTINAL A:   Chronic aspiration pneumonia, not interested in G-tube nor would it be effective for prevention of aspiration P:   Speech evaluation Regular diet for now as this has been a chronic issue and ongoing for quite some time  HEMATOLOGIC A:   No acute issues P:  Monitor for bleeding  INFECTIOUS A:   Aspiration pneumonia versus healthcare associated pneumonia P:   Zosyn Azithromycin Follow-up culture  ENDOCRINE A:   No acute issues   P:   Monitor glucose  NEUROLOGIC A:   No acute issues P:     CODE STATUS: DNR, confirmed with my conversation with her today   FAMILY  - Updates: Husband updated bedside 06/10/16  - Inter-disciplinary family meet or Palliative Care meeting due by:  day 7    My cc time 40 minutes  Roselie Awkward, MD [place] [facility] Pager: [phone] Cell: [phone] After 3pm or if no response, call [phone]   06/10/2016, 4:42 PM

## 2016-06-11 DIAGNOSIS — J432 Centrilobular emphysema: Secondary | ICD-10-CM

## 2016-06-11 LAB — CBC
HEMATOCRIT: 44.9 % (ref 36.0–46.0)
Hemoglobin: 14.4 g/dL (ref 12.0–15.0)
MCH: 26.4 pg (ref 26.0–34.0)
MCHC: 32.1 g/dL (ref 30.0–36.0)
MCV: 82.4 fL (ref 78.0–100.0)
PLATELETS: 186 10*3/uL (ref 150–400)
RBC: 5.45 MIL/uL — AB (ref 3.87–5.11)
RDW: 17 % — ABNORMAL HIGH (ref 11.5–15.5)
WBC: 6.4 10*3/uL (ref 4.0–10.5)

## 2016-06-11 LAB — GLUCOSE, CAPILLARY: Glucose-Capillary: 113 mg/dL — ABNORMAL HIGH (ref 65–99)

## 2016-06-11 LAB — TROPONIN I: Troponin I: <0.03 ng/mL (ref <0.031)

## 2016-06-11 LAB — STREP PNEUMONIAE URINARY ANTIGEN: Strep Pneumo Urinary Antigen: NEGATIVE

## 2016-06-11 MED ORDER — MOMETASONE FURO-FORMOTEROL FUM 100-5 MCG/ACT IN AERO
2.0000 | INHALATION_SPRAY | Freq: Two times a day (BID) | RESPIRATORY_TRACT | Status: DC
  Administered 2016-06-11 – 2016-06-12 (×4): 2 via RESPIRATORY_TRACT
  Filled 2016-06-11 (×2): qty 8.8

## 2016-06-11 NOTE — Telephone Encounter (Signed)
I am caring for her at [facility]

## 2016-06-11 NOTE — Progress Notes (Signed)
Initial Nutrition Assessment  DOCUMENTATION CODES:   Non-severe (moderate) malnutrition in context of chronic illness  INTERVENTION:  - Continue Ensure Enlive po BID, each supplement provides 350 kcal and 20 grams of protein. - Continue encouragement of intakes of meals and supplements. - RD will continue to monitor for additional needs.  NUTRITION DIAGNOSIS:   Increased nutrient needs related to chronic illness as evidenced by estimated needs  GOAL:   Patient will meet greater than or equal to 90% of their needs  MONITOR:   PO intake, Supplement acceptance, Weight trends, Labs, I & O's  REASON FOR ASSESSMENT:   Malnutrition Screening Tool  ASSESSMENT:   82 Y/O with H/O chronic atrial fibrillation on this chronic respiratory failure secondary to COPD, reccurent aspiration. She was seen in clinic in May 2017 for COPD exacerbation treated with antibiotics and steroids. She was sent in today to ED from the pulmonary clinic for increased dyspnea, hypoxemia.  Pt seen for MST. BMI indicates normal weight/borderline underweight. Per chart review, pt ate 100% of dinner last night. Pt reports that for breakfast this AM she had a bowl of Cheerios, 1/2 fruit cup, 1/2 piece of toast with butter and jelly, and 2 cups of coffee. Pt denies abdominal pain or nausea with intakes this AM or PTA. She states that she intermittently has coughing spells which had recently been improving but has been worsened in the week PTA. She denies any coughing with or after eating this AM. Pt denies chewing or swallowing issues; hx of recurrent aspiration PNA and notes indicate pt does not want G-tube. Pt states that she chews foods very thoroughly before swallowing. She denies SOB with POs.   Ensure Enlive already ordered BID and pt reports she has been drinking Ensure at home for several years. Provided pt with coupons for this supplement for home use. Pt states UBW of 119-122 lbs and that she tries to stay on the  higher end of this range when able. Per chart review, pt has lost 8 lbs (6.3% body weight) in the past 4 months which is not significant for time frame. Physical assessment shows moderate muscle and moderate fat wasting to upper and lower body.   Pt likely meeting minimal needs at this time. Medications reviewed; 40 mg oral Lasix/day. Labs reviewed; Cl: 96 mmol/L, BUN: 21 mg/dL, creatinine elevated, GFR: 43 mL/min.   Diet Order:  Diet regular Room service appropriate?: Yes; Fluid consistency:: Thin  Skin:  Reviewed, no issues  Last BM:  6/20  Height:   Ht Readings from Last 1 Encounters:  06/10/16 5\' 7"  (1.702 m)    Weight:   Wt Readings from Last 1 Encounters:  06/11/16 119 lb 4.3 oz (54.1 kg)    Ideal Body Weight:  61.36 kg (kg)  BMI:  Body mass index is 18.68 kg/(m^2).  Estimated Nutritional Needs:   Kcal:  1350-1550  Protein:  65-75 grams (1.2-1.4 grams/kg)  Fluid:  1.5 L/day  EDUCATION NEEDS:   No education needs identified at this time     Jarome Matin, MS, RD, LDN Inpatient Clinical Dietitian Pager # [phone] After hours/weekend pager # [phone]

## 2016-06-11 NOTE — Progress Notes (Signed)
PULMONARY / CRITICAL CARE MEDICINE   Name: Kristine Simmons MRN: [id number] DOB: [birth year]    ADMISSION DATE:  06/10/2016 CONSULTATION DATE:  06/10/16  REFERRING MD:  ED  CHIEF COMPLAINT: Dyspnea, hypoxic resp failure   HISTORY OF PRESENT ILLNESS:   82 Y/O with H/O chronic atrial fibrillation on this chronic respiratory failure secondary to COPD, reccurent aspiration. She was seen in clinic in May 2017 for COPD exacerbation treated with antibiotics and steroids. She was sent in today to ED from the pulmonary clinic for increased dyspnea, hypoxemia. In the ED she was noted to have sats of 90 in 50% FM She states that for 2-3 days prior to admission she felt fatigued, mild increased shortness of breath and hypoxemia worse in the mornings. She said that she denies a change in her baseline cough. No chest pain, no fevers or chills. She said she's noted some back pain with long-standing. Some leg swelling recently but this has not been worse in the last few days. She came to the pulmonary office today and her O2 saturation note were less than 80% on nasal cannula 8LPM so she was sent to the ER.   SUBJECTIVE:  Feels as detailed above  VITAL SIGNS: BP 126/71 mmHg  Pulse 81  Temp(Src) 97.5 F (36.4 C) (Oral)  Resp 14  Ht 5\' 7"  (1.702 m)  Wt 119 lb 4.3 oz (54.1 kg)  BMI 18.68 kg/m2  SpO2 93%  HEMODYNAMICS:    VENTILATOR SETTINGS:    INTAKE / OUTPUT: I/O last 3 completed shifts: In: 340 [P.O.:240; IV Piggyback:100] Out: 500 [Urine:500]  PHYSICAL EXAMINATION: General:  Elderly female, no distress, good spirits Neuro:  Awake alert, oriented 4, no focal motor def  HEENT:  Normocephalic/atraumatic, no JVD Cardiovascular:  Irregularly irregular, rate controlled Lungs: normal resp effort, rhonchi on right. Decreased both bases Abdomen:  Bowel sounds positive, nontender nondistended Musculoskeletal:  Normal bulk and tone Skin:  No rash or skin breakdown, no  edema  LABS:  BMET  Recent Labs Lab 06/10/16 1356  NA 138  K 3.5  CL 96*  CO2 35*  BUN 21*  CREATININE 1.11*  GLUCOSE 123*    Electrolytes  Recent Labs Lab 06/10/16 1356 06/10/16 1727  CALCIUM 9.2  --   MG  --  1.9  PHOS  --  3.7    CBC  Recent Labs Lab 06/10/16 1356 06/11/16 0448  WBC 6.9 6.4  HGB 14.3 14.4  HCT 44.4 44.9  PLT 179 186    Coag's No results for input(s): APTT, INR in the last 168 hours.  Sepsis Markers  Recent Labs Lab 06/10/16 1727  LATICACIDVEN 1.2  PROCALCITON <0.10    ABG  Recent Labs Lab 06/10/16 1356 06/10/16 1654  PHART 7.473* 7.482*  PCO2ART 44.2 43.2  PO2ART 52.4* 51.6*    Liver Enzymes No results for input(s): AST, ALT, ALKPHOS, BILITOT, ALBUMIN in the last 168 hours.  Cardiac Enzymes  Recent Labs Lab 06/10/16 1727 06/10/16 2241 06/11/16 0448  TROPONINI 0.04* 0.03 <0.03    Glucose  Recent Labs Lab 06/10/16 2348  GLUCAP 113*    Imaging Dg Chest Portable 1 View  06/10/2016  CLINICAL DATA:  82 year old female with a history of low oxygen saturation EXAM: PORTABLE CHEST 1 VIEW COMPARISON:  06/03/2016, 05/11/2016 FINDINGS: Cardiomediastinal silhouette unchanged in size and contour. Atherosclerosis of the aortic arch. Stigmata of emphysema, with flattened hemidiaphragms, and hyperinflation on the AP view. Worsening mixed interstitial and airspace opacities of the bilateral  lungs, worst on the right compared to the prior. Blunting of the left costophrenic angle and obscuration left hemidiaphragm. Calcifications of the aortic arch. No pneumothorax. Surgical changes of left shoulder arthroplasty. IMPRESSION: Advanced emphysema with developing right greater than left mixed interstitial and airspace opacities. Differential includes multifocal pneumonia with developing left parapneumonic effusion, or potentially edema and pleural effusion. Atherosclerosis. Signed, Dulcy Fanny. Earleen Newport, DO [facility] Electronically Signed   By: Corrie Mckusick D.O.   On: 06/10/2016 14:11     STUDIES:  Chest x-ray images personally reviewed showing interstitial infiltrate in the right, significant disease in the  CULTURES: 06/10/2016 blood culture  ANTIBIOTICS: 06/10/2016 Zosyn 06/10/2016 azithromycin  SIGNIFICANT EVENTS: 06/10/2016 admission  LINES/TUBES:   DISCUSSION: This is an 82 year old female who advanced emphysema and a poorly understood interstitial fibrosis which is felt to be related to chronic aspiration who presents with yet another case of acute on chronic respiratory failure with hypoxemia. The most likely etiology is aspiration pneumonia given the distribution of abnormality on her chest x-ray findings and her clinical history. Feels better. Sats stable on high flow O2. Will stop steroids and cont abx. SLP eval pending. Will move to med/surg.   ASSESSMENT / PLAN:   Acute on Chronic Hypoxic respiratory failure in setting of Chronic aspiration pneumonia vs HCAP not interested in G-tube nor would it be effective for prevention of aspiration Advanced COPD-->no evidence of bronchospasm  F/u Speech evaluation Regular diet for now as this has been a chronic issue and ongoing for quite some time Cont Zosyn, day 1/x Azithromycin cont Follow-up culture Add flutter Dc steroids Wean high flow O2 OOB OK for xfer to ward   Chronic A. fib P:  Continue digoxin Continue diltiazem Continue Eliquis Telemetry monitoring  CODE STATUS: DNR, confirmed with my conversation with her today   FAMILY  - Updates: Husband updated bedside 06/10/16  - Inter-disciplinary family meet or Palliative Care meeting due by:  day Kalida ACNP-BC [place] Pager # [phone] OR # [phone] if no answer     06/11/2016, 9:06 AM   Attending:  I have seen and examined the patient with nurse practitioner/resident and agree with  the note above.  We formulated the plan together and I elicited the following history.    Much better today Oxygenation improving Eating  On exam: Lungs without wheezing, improved crackles Sitting up, eating HR: irreg irreg, normal rate  COPD> no exacerbation, hold systemic steroids, restart Dulera (substitute for Symbicort in hospital) Aspiration pneumonia> continue current antibiotics, narrow tomorrow, SLP eval, she is not interested in changing her diet, but if they could help her with swallowing/prevention as best as possible this would be helpful. Acute on chronic respiratory failure with hypoxemia> titrate O2 for O2 saturation > 85% Out of bed  To floor  Roselie Awkward, MD [place] Pager: [phone] Cell: [phone] After 3pm or if no response, call [phone]

## 2016-06-12 DIAGNOSIS — N390 Urinary tract infection, site not specified: Secondary | ICD-10-CM

## 2016-06-12 DIAGNOSIS — J9612 Chronic respiratory failure with hypercapnia: Secondary | ICD-10-CM

## 2016-06-12 LAB — LEGIONELLA PNEUMOPHILA SEROGP 1 UR AG: L. PNEUMOPHILA SEROGP 1 UR AG: NEGATIVE

## 2016-06-12 MED ORDER — AMOXICILLIN-POT CLAVULANATE 875-125 MG PO TABS
1.0000 | ORAL_TABLET | Freq: Two times a day (BID) | ORAL | Status: DC
  Administered 2016-06-12 – 2016-06-13 (×3): 1 via ORAL
  Filled 2016-06-12 (×4): qty 1

## 2016-06-12 MED ORDER — ZOLPIDEM TARTRATE 5 MG PO TABS
5.0000 mg | ORAL_TABLET | Freq: Once | ORAL | Status: AC
  Administered 2016-06-12: 5 mg via ORAL
  Filled 2016-06-12: qty 1

## 2016-06-12 NOTE — Progress Notes (Signed)
[facility] Progress Note Patient Name: Kristine Simmons DOB: 18-Mar-1927 MRN: [id number]   Date of Service  06/12/2016  HPI/Events of Note  Patient requests sleep aid.  eICU Interventions  Will order Ambien 5 mg PO X 1.      Intervention Category Intermediate Interventions: Other:  Lysle Dingwall 06/12/2016, 9:42 PM

## 2016-06-12 NOTE — Progress Notes (Signed)
Spoke with patient and spouse at bedside. Discussed current home situation and potential need for [facility] services. Patient states she has had PT in the home on multiple occasions and does not feel she needs further treatment. Has a housekeeper that comes in several times a week. Her husband is very supportive and assists with most activities. Attending present during some of conversation, he agrees that patient likely not need HH but just close f/u with the office. Patient having issues with portable tank being covered by insurance. Attending states she should f/u with office staff to assist with that. Patient having issue with hearing aids working properly with high flow O2, causes aids to whistle, [facility] to see if there is a product to help with this. Contacted Jermaine with [facility] to f/u.

## 2016-06-12 NOTE — Progress Notes (Signed)
PULMONARY / CRITICAL CARE MEDICINE   Name: Kristine Simmons MRN: [id number] DOB: [birth year]    ADMISSION DATE:  06/10/2016 CONSULTATION DATE:  06/10/16  REFERRING MD:  ED  CHIEF COMPLAINT: Dyspnea, hypoxic resp failure   SUBJECTIVE:  Improved breathing Has not been out of bed yet Urine culture positive  VITAL SIGNS: BP 107/59 mmHg  Pulse 73  Temp(Src) 98.5 F (36.9 C) (Oral)  Resp 20  Ht 5\' 7"  (1.702 m)  Wt 119 lb 4.3 oz (54.1 kg)  BMI 18.68 kg/m2  SpO2 88%  HEMODYNAMICS:    VENTILATOR SETTINGS:    INTAKE / OUTPUT: I/O last 3 completed shifts: In: 1260 [P.O.:1160; IV Piggyback:100] Out: 376 [Urine:375; Stool:1]  PHYSICAL EXAMINATION: General:  Comfortable in bed HEENT normocephalic/atraumatic, or fax clear Pulmonary crackles bases, no wheezing Cardiovascular irregularly irregular, rate controlled Abdomen: Bowel sounds positive, soft nontender Extremities no edema Dermatologic few bruises extremities  LABS:  BMET  Recent Labs Lab 06/10/16 1356  NA 138  K 3.5  CL 96*  CO2 35*  BUN 21*  CREATININE 1.11*  GLUCOSE 123*    Electrolytes  Recent Labs Lab 06/10/16 1356 06/10/16 1727  CALCIUM 9.2  --   MG  --  1.9  PHOS  --  3.7    CBC  Recent Labs Lab 06/10/16 1356 06/11/16 0448  WBC 6.9 6.4  HGB 14.3 14.4  HCT 44.4 44.9  PLT 179 186    Coag's No results for input(s): APTT, INR in the last 168 hours.  Sepsis Markers  Recent Labs Lab 06/10/16 1727  LATICACIDVEN 1.2  PROCALCITON <0.10    ABG  Recent Labs Lab 06/10/16 1356 06/10/16 1654  PHART 7.473* 7.482*  PCO2ART 44.2 43.2  PO2ART 52.4* 51.6*    Liver Enzymes No results for input(s): AST, ALT, ALKPHOS, BILITOT, ALBUMIN in the last 168 hours.  Cardiac Enzymes  Recent Labs Lab 06/10/16 1727 06/10/16 2241 06/11/16 0448  TROPONINI 0.04* 0.03 <0.03    Glucose  Recent Labs Lab 06/10/16 2348  GLUCAP 113*    Imaging No results found.   STUDIES:  Chest  x-ray images personally reviewed showing interstitial infiltrate in the right, significant disease in the  CULTURES: 06/10/2016 blood culture  ANTIBIOTICS: 06/10/2016 Zosyn 06/10/2016 azithromycin  SIGNIFICANT EVENTS: 06/10/2016 admission  LINES/TUBES:   DISCUSSION: This is an 82 year old female who advanced emphysema and a poorly understood interstitial fibrosis which is felt to be related to chronic aspiration who presents with yet another case of acute on chronic respiratory failure with hypoxemia. The most likely etiology is aspiration pneumonia given the distribution of abnormality on her chest x-ray findings and her clinical history. Feels better. Sats stable on high flow O2. Will stop steroids and cont abx. SLP eval pending. Will move to med/surg.   ASSESSMENT / PLAN:   Acute on Chronic Hypoxic respiratory failure in setting of Chronic aspiration pneumonia vs HCAP not interested in G-tube nor would it be effective for prevention of aspiration Advanced COPD-->no evidence of bronchospasm  Appreciate speech, patient advised to eat slowly, don't talk and eat Stop vanc and zosyn Follow-up culture Flutter Wean high flow O2 OOB Ambulate today and monitor O2 saturation   Chronic A. fib P:  Continue digoxin Continue diltiazem Continue Eliquis Telemetry monitoring  Urinary tract infection, Escherichia coli Plan: Narrow antibiotics to Augmentin  CODE STATUS: DNR   FAMILY  - Updates: Husband updated bedside 06/12/16  - Inter-disciplinary family meet or Palliative Care meeting due by:  day 7  Roselie Awkward, MD [place] [facility] Pager: [phone] Cell: [phone] After 3pm or if no response, call [phone]    06/12/2016, 11:33 AM

## 2016-06-12 NOTE — Clinical Documentation Improvement (Signed)
Critical Care  Abnormal Lab/Test Results:  Urine culture positive for >100,000 of ecoli; Urinalysis - leukocytes, large  Possible Clinical Conditions associated with below indicators  UTI due to ecoli  Other Condition  Cannot Clinically Determine    Treatment Provided: Already on antibiotics for PN   Please exercise your independent, professional judgment when responding. A specific answer is not anticipated or expected.   Thank You,  [place] [phone]

## 2016-06-12 NOTE — Evaluation (Signed)
Clinical/Bedside Swallow Evaluation Patient Details  Name: Kristine Simmons MRN: [id number] Date of Birth: 1927-04-16  Today's Date: 06/12/2016 Time: SLP Start Time (ACUTE ONLY): 1015 SLP Stop Time (ACUTE ONLY): 1050 SLP Time Calculation (min) (ACUTE ONLY): 35 min  Past Medical History:  Past Medical History  Diagnosis Date  . Hx of colonic polyps     last colonoscopy 2005, repeat was due 2010 Dr. Sharlett Iles  . Atrial fibrillation ([place])     Dr Percival Spanish  . Fracture 1995, 2009    RUE; LUE Dr. Durward Fortes  . 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 Cystitis   . Complication of anesthesia     difficulty with arousal post op  . Hyperlipidemia   . Hyperlipidemia   . Osteoporosis   . Cerebrovascular accident ([place]) 01/09  . Hypothyroidism   . Dysrhythmia   . Spinal stenosis of lumbar region 01/10/2016  . Acute diastolic heart failure ([place]) 11/03/2015  . Aortic stenosis, mild 08/15/2014    Echo June, 2015, mild aortic stenosis   . Cardiorenal syndrome 11/05/2015  . Chronic anticoagulation 08/15/2014    Eliquis for A fib   . Chronic atrial fibrillation ([place]) 11/02/2015  . COPD, Gold grade A 04/08/2008    January 2016 pulmonary function testing> ratio 58% FEV1 1.83L (88% pred, no change with BD), TLC 5.78L (103% pred), DLCO 9.01 (31% pred) 07/2015 ONO RA > spO2 79% average 07/2015 ONO 2LPM> < 88% 189 min 08/2015 ONO 3LPM < 88% 69.8 min 09/2015 ONO 4LPM > OK   . History of CVA (cerebrovascular accident) 08/15/2014  . CAROTID STENOSIS 08/08/2009    Qualifier: Diagnosis of  By: Percival Spanish, MD, Farrel Gordon    . Depression 09/22/2014  . G E R D 06/02/2007    Qualifier: Diagnosis of  By: Linna Darner MD, William    . HYPERTENSION, ESSENTIAL NOS 12/01/2007    Qualifier: Diagnosis of  By: Linna Darner MD, Gwyndolyn Saxon    . T12 compression fracture ([place]) 11/02/2015  . Squamous cell carcinoma of neck ([place]) 03/09/2014    3/15 Dr Wilhemina Bonito    Past Surgical History:  Past Surgical History  Procedure  Laterality Date  . Tonsillectomy and adenoidectomy    . Vocal cord polyps    . Thyroid needle biopsy  2002  . Appendectomy    . Cataract extraction      Bilat  . Colonoscopy w/ polypectomy  2005    Diverticulosis;Dr. Sharlett Iles  . Total hip arthroplasty  2008    CHF & RAF post op   . Total shoulder replacement  2010    Dr Durward Fortes - left shoulder  . Mohs surgery  2013    nasal Basal Cell; Dr Sarajane Jews  . Excision metacarpal mass Right 08/17/2013    Procedure: RIGHT LONG EXCISION MASS AND DIP JOINT DEBRIDEMENT;  Surgeon: Tennis Must, MD;  Location: [facility];  Service: Orthopedics;  Laterality: Right;  . Femoral hernia repair Right 08/17/2014    Procedure: OPEN REPAIR RIGHT FEMORAL HERNIA  WITH INSERTION OF MESH;  Surgeon: Adin Hector, MD;  Location: [facility];  Service: General;  Laterality: Right;  . Insertion of mesh  08/17/2014    Procedure: INSERTION OF MESH;  Surgeon: Adin Hector, MD;  Location: [facility];  Service: General;;  . Inguinal hernia repair Right 11/15/2014    Procedure: LAPAROSCOPIC RIGHT INGUINAL HERNIA WITH MESH;  Surgeon: Alphonsa Overall, MD;  Location: [facility];  Service: General;  Laterality: Right;   HPI:  82 yo female adm to [facility] with respiratory deficits. PMH + for COPD, chronic aspiration, multifactorial dysphagia, blindness, CVA, hypothyroidism, blindness, afib, colon polyps, osteoporosis, HLD and vocal fold polyps.  Pt found to have right more than left opacities per CXR.  Swallow evaluation ordered.  Pt with h/o severe esophageal dysmotility with decreased primary peristalsis, severe tertiary contractions.  Pt MBS from 02/2015 showed mild pharyngeal dysphagia with delayed swallow trigger and mild pharyngeal residuals.  Recommendation was for regular/thin diet - medicine whole with puree, follow solids with liquids and multiple dry swallows.  BSE ordered by Md.    Assessment / Plan / Recommendation Clinical Impression  SLP educated pt to prior MBS  and esophagram tests using recorded video in EPIC to support clinical reasoning for compensation strategies.  CN largely unremarkable except mild left facial asymmetry and decreased left eye opening.  Pt observed consuming water via straw with timely swallow and clear voice.  Did not observe pt with other consistencies as case manager was waiting to see pt as well as CCS Md. RN reports pt with good tolerance of pills with liquids.    Upon interview, pt reports foods causing her to cough and sensation of food/pills lodging in throat.  Multifactorial source of aspiration suspected due to pt's Zenker's diverticulum, esophageal dysmotility and mild pharyngeal deficits.  Using teach back and written instuctions, SLP educated to aspiration and esophageal precautions including starting meals with liquids to compensate for xerostomia, following solids with liquids and eating small meals.    Recommend continue regular/thin diet.  Pt reports she found information very helpful. SLP will follow up x 1 to assure education completed and tolerance improved.  Pt and spouse agreeable to plan.     Aspiration Risk    Moderate and ongoing due to multifactorial dysphagia    Diet Recommendation Regular;Thin liquid   Medication Administration: Whole meds with puree (crush if large and not contraindicated) Supervision: Patient able to self feed;Full supervision/cueing for compensatory strategies Compensations: Slow rate;Small sips/bites;Follow solids with liquid (start meals with liquids) Postural Changes: Seated upright at 90 degrees;Remain upright for at least 30 minutes after po intake (eat small meals)    Other  Recommendations Oral Care Recommendations: Oral care BID   Follow up Recommendations       Frequency and Duration min 1 x/week  1 week       Prognosis Prognosis for Safe Diet Advancement: Fair Barriers to Reach Goals: Severity of deficits;Other (Comment);Time post onset      Swallow Study    General Date of Onset: 06/12/16 HPI: 82 yo female adm to [facility] with respiratory deficits. PMH + for COPD, chronic aspiration, multifactorial dysphagia, blindness, CVA, hypothyroidism, blindness, afib, colon polyps, osteoporosis, HLD and vocal fold polyps.  Pt found to have right more than left opacities per CXR.  Swallow evaluation ordered.  Pt with h/o severe esophageal dysmotility with decreased primary peristalsis, severe tertiary contractions.  Pt MBS from 02/2015 showed mild pharyngeal dysphagia with delayed swallow trigger and mild pharyngeal residuals.  Recommendation was for regular/thin diet - medicine whole with puree, follow solids with liquids and multiple dry swallows.  BSE ordered by Md.  Type of Study: Bedside Swallow Evaluation Diet Prior to this Study: Thin liquids;Regular Temperature Spikes Noted: Yes (low grade) Respiratory Status: Nasal cannula Behavior/Cognition: Alert;Cooperative;Pleasant mood Oral Cavity Assessment: Within Functional Limits Oral Care Completed by SLP: No Oral Cavity - Dentition: Adequate natural dentition Vision: Functional for self-feeding Self-Feeding  Abilities: Able to feed self Patient Positioning: Upright in bed Baseline Vocal Quality:  (pt reports voice becomes weaker toward end of day when sleepy) Volitional Cough: Strong Volitional Swallow: Able to elicit    Oral/Motor/Sensory Function Overall Oral Motor/Sensory Function: Mild impairment (decreased eye opening  - left, ? mild facial asymmetry on left)   Ice Chips Ice chips: Not tested   Thin Liquid Thin Liquid: Within functional limits Presentation: Straw    Nectar Thick Nectar Thick Liquid: Not tested   Honey Thick Honey Thick Liquid: Not tested   Puree Puree: Not tested   Solid   GO   Solid: Not tested        Luanna Salk, [place] [facility] SLP [phone]

## 2016-06-13 DIAGNOSIS — I482 Chronic atrial fibrillation: Secondary | ICD-10-CM

## 2016-06-13 DIAGNOSIS — N390 Urinary tract infection, site not specified: Secondary | ICD-10-CM

## 2016-06-13 MED ORDER — CEFPODOXIME PROXETIL 200 MG PO TABS
200.0000 mg | ORAL_TABLET | Freq: Two times a day (BID) | ORAL | Status: DC
  Filled 2016-06-13 (×2): qty 1

## 2016-06-13 MED ORDER — CEFPODOXIME PROXETIL 200 MG PO TABS: 200.0000 mg | ORAL_TABLET | Freq: Two times a day (BID) | ORAL | Status: DC

## 2016-06-13 NOTE — Discharge Summary (Signed)
Physician Discharge Summary  Patient ID: Kristine Simmons MRN: [id number] DOB/AGE: 80-01-1927 80 y.o.  Admit date: 06/10/2016 Discharge date: 06/13/2016  Problem List Active Problems:   Hypothyroidism   Atrial fibrillation (HCC)   COPD, Gold grade A   Glaucoma   Chronic anticoagulation   Acute on chronic respiratory failure with hypoxia (HCC)   Chronic atrial fibrillation (HCC)   Pneumonia   UTI (urinary tract infection) with e coli  HPI: 82 Y/O with H/O chronic atrial fibrillation on this chronic respiratory failure secondary to COPD, reccurent aspiration. She was seen in clinic in May 2017 for COPD exacerbation treated with antibiotics and steroids. She was sent in today to ED from the pulmonary clinic for increased dyspnea, hypoxemia.  In the ED she was noted to have sats of 90 in 50% FM  She states that for 2-3 days prior to admission she felt fatigued, mild increased shortness of breath and hypoxemia worse in the mornings. She said that she denies a change in her baseline cough. No chest pain, no fevers or chills. She said she's noted some back pain with long-standing. Some leg swelling recently but this has not been worse in the last few days.  She came to the pulmonary office today and her O2 saturation note were less than 80% on nasal cannula 8LPM so she was sent to the ER. Hospital Course:   PHYSICAL EXAMINATION: General: Comfortable in bed HEENT normocephalic/atraumatic, or fax clear Pulmonary decreased in bases, no wheezing Cardiovascular irregularly irregular, rate controlled Abdomen: Bowel sounds positive, soft nontender Extremities no edema Dermatologic few bruises extremities   STUDIES:  Chest x-ray images personally reviewed showing interstitial infiltrate in the right, significant disease in the  CULTURES: 06/10/2016 blood culture  ANTIBIOTICS: 06/10/2016 Zosyn 06/10/2016 azithromycin 6/22 Vantin>>  SIGNIFICANT EVENTS: 06/10/2016  admission     DISCUSSION: This is an 82 year old female who advanced emphysema and a poorly understood interstitial fibrosis which is felt to be related to chronic aspiration who presents with yet another case of acute on chronic respiratory failure with hypoxemia. The most likely etiology is aspiration pneumonia given the distribution of abnormality on her chest x-ray findings and her clinical history. Feels better. Sats stable on high flow O2. Will stop steroids and cont abx. SLP eval pending. Will move to med/surg. Abx changed as noted. Remains on 6 l [place] at rest.  ASSESSMENT / PLAN:   Acute on Chronic Hypoxic respiratory failure in setting of Chronic aspiration pneumonia vs HCAP not interested in G-tube nor would it be effective for prevention of aspiration Advanced COPD-->no evidence of bronchospasm  Appreciate speech, patient advised to eat slowly, don't talk and eat. Stop vanc and zosyn Follow-up culture [place] Wean high flow O2 as tolerated OOB Ambulate today and monitor O2 saturation. Sats 91% on 6 l/m at rest Sats94% walking on 6 l/m [place] DC home on 4 l/m [place] at rest and 6 l/m with activity and sleep   Chronic A. fib P:  Continue digoxin Continue diltiazem Continue Eliquis Telemetry monitoring  Urinary tract infection, Escherichia coli Plan: Narrow antibiotics to cefpodoxime (Vantin) for 5 more days, e coli resistant to Augmentin.   CODE STATUS: DNR   FAMILY  - Updates: Husband updated bedside 06/13/16   Labs at discharge Lab Results  Component Value Date   CREATININE 1.11* 06/10/2016   BUN 21* 06/10/2016   NA 138 06/10/2016   K 3.5 06/10/2016   CL 96* 06/10/2016   CO2 35* 06/10/2016   Lab  Results  Component Value Date   WBC 6.4 06/11/2016   HGB 14.4 06/11/2016   HCT 44.9 06/11/2016   MCV 82.4 06/11/2016   PLT 186 06/11/2016   Lab Results  Component Value Date   ALT 14 01/10/2016   AST 23 01/10/2016   ALKPHOS 51 01/10/2016   BILITOT 1.0  01/10/2016   Lab Results  Component Value Date   INR 1.47 11/03/2015   INR 1.23 04/06/2015   INR 1.5 05/12/2013   PROTIME 22.6 05/29/2009    Current radiology studies No results found.  Disposition: Home      Discharge Instructions    Discharge patient    Complete by:  As directed             Medication List    TAKE these medications        apraclonidine 0.5 % ophthalmic solution  Commonly known as:  IOPIDINE  Place 1 drop into the right eye every 12 (twelve) hours.     budesonide-formoterol 80-4.5 MCG/ACT inhaler  Commonly known as:  SYMBICORT  Inhale 2 puffs into the lungs 2 (two) times daily.     cefpodoxime 200 MG tablet  Commonly known as:  VANTIN  Take 1 tablet (200 mg total) by mouth every 12 (twelve) hours.     DIGOX 0.125 MG tablet  Generic drug:  digoxin  TAKE 1 TABLET ONCE DAILY.     diltiazem 120 MG 24 hr capsule  Commonly known as:  CARDIZEM CD  TAKE (1) CAPSULE DAILY.     ELIQUIS 2.5 MG Tabs tablet  Generic drug:  apixaban  TAKE 1 TABLET TWICE DAILY.     ENSURE PLUS Liqd  TAKE ONE CAN BY MOUTH ONCE DAILY     estradiol 0.1 MG/GM vaginal cream  Commonly known as:  ESTRACE  Place 2 g vaginally 2 (two) times a week. On Wednesdays & Saturdays.     furosemide 40 MG tablet  Commonly known as:  LASIX  Take 1 tablet (40 mg total) by mouth daily.     levothyroxine 50 MCG tablet  Commonly known as:  SYNTHROID, LEVOTHROID  TAKE 1 TABLET ONCE DAILY.     Misc Intestinal Flora Regulat Caps  Take 1 capsule by mouth daily.     OXYGEN  Inhale into the lungs continuous.     polyethylene glycol powder powder  Commonly known as:  GLYCOLAX/MIRALAX  Take 17 g by mouth daily. Mix 1 capful of powder and dissolve it in any liquid. Drink once daily.     sertraline 50 MG tablet  Commonly known as:  ZOLOFT  Take 1 tablet (50 mg total) by mouth daily.     sertraline 25 MG tablet  Commonly known as:  ZOLOFT  Take 1 tablet (25 mg total) by mouth  daily. In addition to 50 mg daily for a total of 75 mg daily     SYSTANE BALANCE OP  Place 1 drop into both eyes as needed (dry and pain).     timolol 0.5 % ophthalmic solution  Commonly known as:  TIMOPTIC  Place 1 drop into both eyes 2 (two) times daily.     trimethoprim 100 MG tablet  Commonly known as:  TRIMPEX  Take 100 mg by mouth every other day.     VENTOLIN HFA 108 (90 Base) MCG/ACT inhaler  Generic drug:  albuterol  Inhale 2 puffs into the lungs every 4 (four) hours as needed for wheezing or shortness of breath.  VYTORIN 10-40 MG tablet  Generic drug:  ezetimibe-simvastatin  TAKE 1 TABLET ONCE DAILY.       Follow-up Information    Follow up with Magdalen Spatz, NP On 07/03/2016.   Specialty:  Pulmonary Disease   Why:  9:30 am with Eric Form NP   Contact information:   [address] [place] [address] [phone]        Discharged Condition: fair  Time spent on discharge greater than 40 minutes.  Vital signs at Discharge. Temp:  [97.5 F (36.4 C)-97.6 F (36.4 C)] 97.6 F (36.4 C) (06/22 0502) Pulse Rate:  [63-70] 70 (06/22 0502) Resp:  [19-20] 20 (06/22 0502) BP: (99-122)/(45-64) 122/64 mmHg (06/22 0502) SpO2:  [92 %-94 %] 94 % (06/22 0741) Office follow up Special Information or instructions. Sheis to follow up with Anselm Lis NP on July 12 th Signed: Richardson Landry Kristianne Albin ACNP [place] Pager [phone] till 3 pm If no answer page [phone] 06/13/2016, 12:48 PM

## 2016-06-13 NOTE — Care Management Important Message (Signed)
Important Message  Patient Details  Name: SHIAH OAKEY MRN: [id number] Date of Birth: 1927-07-06   Medicare Important Message Given:  Yes    Camillo Flaming 06/13/2016, 9:06 AMImportant Message  Patient Details  Name: AYLEE VASTA MRN: [id number] Date of Birth: Oct 18, 1927   Medicare Important Message Given:  Yes    Camillo Flaming 06/13/2016, 9:06 AM

## 2016-06-13 NOTE — Progress Notes (Signed)
SATURATION QUALIFICATIONS: (This note is used to comply with regulatory documentation for home oxygen)  Patient Saturations on Room Air at Rest = %  Patient Saturations on Room Air while Ambulating = %  Patient Saturations on 6 Liters of oxygen while Ambulating = 97%  Please briefly explain why patient needs home oxygen:

## 2016-06-13 NOTE — Progress Notes (Signed)
Patient discharged home with husband, pt states she already has her home oxygen in place, husband states portable tanks are in the car. IV access removed. Pt denies need for home health, no SNF needs identified. Prescriptions sent electronically to pharmacy on file - pt and husband informed. Pt with stable VS and in stable condition at time of discharge. Discharge instructions discussed with patient and her husband, and they state understanding. Taken via wheelchair to car. Follow up appointment discussed with patient and visible upon discharge instructions.

## 2016-06-13 NOTE — Progress Notes (Signed)
ANTICOAGULATION CONSULT NOTE - Follow-Up  Pharmacy Consult for Eliquis Indication: atrial fibrillation  Allergies  Allergen Reactions  . Hydrocodone Nausea And Vomiting  . Desmopressin Acetate     REACTION: swelling feet  . Dorzolamide Hcl Other (See Comments)    Burning in eyes and redness  . Prempro [Conj Estrog-Medroxyprogest Ace]     unknown  . Ciprofloxacin     REACTION: vaginal infection    Patient Measurements: Height: 5\' 7"  (170.2 cm) Weight: 119 lb 4.3 oz (54.1 kg) IBW/kg (Calculated) : 61.6  Vital Signs: Temp: 97.6 F (36.4 C) (06/22 0502) Temp Source: Oral (06/22 0502) BP: 122/64 mmHg (06/22 0502) Pulse Rate: 70 (06/22 0502)  Labs:  Recent Labs  06/10/16 1356 06/10/16 1727 06/10/16 2241 06/11/16 0448  HGB 14.3  --   --  14.4  HCT 44.4  --   --  44.9  PLT 179  --   --  186  CREATININE 1.11*  --   --   --   TROPONINI  --  0.04* 0.03 <0.03    Estimated Creatinine Clearance: 30.5 mL/min (by C-G formula based on Cr of 1.11).   Medical History: Past Medical History  Diagnosis Date  . Hx of colonic polyps     last colonoscopy 2005, repeat was due 2010 Dr. Sharlett Iles  . Atrial fibrillation ([place])     Dr Percival Spanish  . Fracture 1995, 2009    RUE; LUE Dr. Durward Fortes  . 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 Cystitis   . Complication of anesthesia     difficulty with arousal post op  . Hyperlipidemia   . Hyperlipidemia   . Osteoporosis   . Cerebrovascular accident ([place]) 01/09  . Hypothyroidism   . Dysrhythmia   . Spinal stenosis of lumbar region 01/10/2016  . Acute diastolic heart failure ([place]) 11/03/2015  . Aortic stenosis, mild 08/15/2014    Echo June, 2015, mild aortic stenosis   . Cardiorenal syndrome 11/05/2015  . Chronic anticoagulation 08/15/2014    Eliquis for A fib   . Chronic atrial fibrillation ([place]) 11/02/2015  . COPD, Gold grade A 04/08/2008    January 2016 pulmonary function testing> ratio 58% FEV1 1.83L (88% pred, no  change with BD), TLC 5.78L (103% pred), DLCO 9.01 (31% pred) 07/2015 ONO RA > spO2 79% average 07/2015 ONO 2LPM> < 88% 189 min 08/2015 ONO 3LPM < 88% 69.8 min 09/2015 ONO 4LPM > OK   . History of CVA (cerebrovascular accident) 08/15/2014  . CAROTID STENOSIS 08/08/2009    Qualifier: Diagnosis of  By: Percival Spanish, MD, Farrel Gordon    . Depression 09/22/2014  . G E R D 06/02/2007    Qualifier: Diagnosis of  By: Linna Darner MD, William    . HYPERTENSION, ESSENTIAL NOS 12/01/2007    Qualifier: Diagnosis of  By: Linna Darner MD, Gwyndolyn Saxon    . T12 compression fracture ([place]) 11/02/2015  . Squamous cell carcinoma of neck ([place]) 03/09/2014    3/15 Dr Wilhemina Bonito     Medications:  PTA Eliquis 2.5 mg bid  Assessment: 73 yoF admitted for COPD vs aspiration PNA.  PMH Afib on low-dose Eliquis (Age > 80 and Wt < 60 kg).  To resume per pharmacy while admitted   CBC, SCr wnl  Goal of Therapy:  Prevent thromboembolic event   Plan:   Continue Eliquis 2.5 mg PO BID  F/u CBC, SCr  Royetta Asal, PharmD, BCPS Pager [phone] 06/13/2016 10:09 AM

## 2016-06-13 NOTE — Discharge Instructions (Signed)
If you feel worse, more short of breath, pain, fever or have any problems please call our office [phone]. Oxygen 4 l/m at rest and increase to 6 l/m if needed at bedtime or with activity.

## 2016-06-14 ENCOUNTER — Telehealth: Payer: Self-pay | Admitting: *Deleted

## 2016-06-14 NOTE — Telephone Encounter (Signed)
Pt was on TCM list admitted for Chronic respiratory failure, secondary to COPD. D/C 6/22, and will f/u with pulmonologist NP Eric Form on 7/12...Johny Chess

## 2016-06-15 LAB — URINE CULTURE: Culture: >=100000 — AB

## 2016-06-15 LAB — CULTURE, BLOOD (ROUTINE X 2)
CULTURE: NO GROWTH
CULTURE: NO GROWTH

## 2016-06-16 NOTE — Progress Notes (Signed)
HPI The patient presents for followup of atrial fibrillation.   Since I last saw her she was in the hospital in Nov with respiratory failure related primarily to Nov.  She was again in the hospital in May for exacerbation of her acute on chronic respiratory failure.  I have reviewed these records.  She was told during these hospitalizations that she had heart failure. Echocardiogram demonstrated some diastolic dysfunction.  She does have elevated pulmonary pressures and is seen by pulmonary for this. She has significant TR. However, there were no other valvular abnormalities. She is on chronic oxygen. She walks with a walker. She is frail and very fatigued but she's not having any acute distress. She denies any acute shortness of breath, PND or orthopnea. Doesn't notice any palpitations, presyncope or syncope. There is no weight gain or edema.  Allergies  Allergen Reactions  . Hydrocodone Nausea And Vomiting  . Desmopressin Acetate     REACTION: swelling feet  . Dorzolamide Hcl Other (See Comments)    Burning in eyes and redness  . Prempro [Conj Estrog-Medroxyprogest Ace]     unknown  . Ciprofloxacin     REACTION: vaginal infection    Current Outpatient Prescriptions  Medication Sig Dispense Refill  . albuterol (VENTOLIN HFA) 108 (90 BASE) MCG/ACT inhaler Inhale 2 puffs into the lungs every 4 (four) hours as needed for wheezing or shortness of breath.     Marland Kitchen apraclonidine (IOPIDINE) 0.5 % ophthalmic solution Place 1 drop into the right eye every 12 (twelve) hours.    . budesonide-formoterol (SYMBICORT) 80-4.5 MCG/ACT inhaler Inhale 2 puffs into the lungs 2 (two) times daily. 1 Inhaler 12  . cefpodoxime (VANTIN) 200 MG tablet Take 1 tablet (200 mg total) by mouth every 12 (twelve) hours. 10 tablet 0  . DIGOX 125 MCG tablet TAKE 1 TABLET ONCE DAILY. (Patient taking differently: TAKE 125 MCG BY MOUTH DAILY) 90 tablet 3  . diltiazem (CARDIZEM CD) 120 MG 24 hr capsule TAKE (1) CAPSULE DAILY.  (Patient taking differently: TAKE  120 MG BY MOUTH  DAILY.) 90 capsule 1  . ELIQUIS 2.5 MG TABS tablet TAKE 1 TABLET TWICE DAILY. (Patient taking differently: TAKE 2.5 MG BY MOUTH TWICE DAILY.) 180 tablet 0  . ENSURE PLUS (ENSURE PLUS) LIQD TAKE ONE CAN BY MOUTH ONCE DAILY 237 mL 2  . estradiol (ESTRACE) 0.1 MG/GM vaginal cream Place 2 g vaginally 2 (two) times a week. On Wednesdays & Saturdays.    . furosemide (LASIX) 40 MG tablet Take 1 tablet (40 mg total) by mouth daily. 90 tablet 3  . levothyroxine (SYNTHROID, LEVOTHROID) 50 MCG tablet TAKE 1 TABLET ONCE DAILY. (Patient taking differently: TAKE 50 MCG BY MOUTH ONCE DAILY) 90 tablet 0  . OXYGEN Inhale into the lungs continuous.    . polyethylene glycol powder (GLYCOLAX/MIRALAX) powder Take 17 g by mouth daily. Mix 1 capful of powder and dissolve it in any liquid. Drink once daily. 255 g 0  . Probiotic Product (MISC INTESTINAL FLORA REGULAT) CAPS Take 1 capsule by mouth daily.      Marland Kitchen Propylene Glycol (SYSTANE BALANCE OP) Place 1 drop into both eyes as needed (dry and pain).    Marland Kitchen sertraline (ZOLOFT) 25 MG tablet Take 1 tablet (25 mg total) by mouth daily. In addition to 50 mg daily for a total of 75 mg daily 30 tablet 5  . sertraline (ZOLOFT) 50 MG tablet Take 1 tablet (50 mg total) by mouth daily. 90 tablet 3  .  timolol (TIMOPTIC) 0.5 % ophthalmic solution Place 1 drop into both eyes 2 (two) times daily.     Marland Kitchen trimethoprim (TRIMPEX) 100 MG tablet Take 100 mg by mouth every other day.     Marland Kitchen VYTORIN 10-40 MG tablet TAKE 1 TABLET ONCE DAILY. 90 tablet 2   No current facility-administered medications for this visit.    Past Medical History  Diagnosis Date  . Hx of colonic polyps     last colonoscopy 2005, repeat was due 2010 Dr. Sharlett Iles  . Atrial fibrillation ([place])     Dr Percival Spanish  . Fracture 1995, 2009    RUE; LUE Dr. Durward Fortes  . 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 Cystitis   . Complication of anesthesia      difficulty with arousal post op  . Hyperlipidemia   . Hyperlipidemia   . Osteoporosis   . Cerebrovascular accident ([place]) 01/09  . Hypothyroidism   . Dysrhythmia   . Spinal stenosis of lumbar region 01/10/2016  . Acute diastolic heart failure ([place]) 11/03/2015  . Aortic stenosis, mild 08/15/2014    Echo June, 2015, mild aortic stenosis   . Cardiorenal syndrome 11/05/2015  . Chronic anticoagulation 08/15/2014    Eliquis for A fib   . Chronic atrial fibrillation ([place]) 11/02/2015  . COPD, Gold grade A 04/08/2008    January 2016 pulmonary function testing> ratio 58% FEV1 1.83L (88% pred, no change with BD), TLC 5.78L (103% pred), DLCO 9.01 (31% pred) 07/2015 ONO RA > spO2 79% average 07/2015 ONO 2LPM> < 88% 189 min 08/2015 ONO 3LPM < 88% 69.8 min 09/2015 ONO 4LPM > OK   . History of CVA (cerebrovascular accident) 08/15/2014  . CAROTID STENOSIS 08/08/2009    Qualifier: Diagnosis of  By: Percival Spanish, MD, Farrel Gordon    . Depression 09/22/2014  . G E R D 06/02/2007    Qualifier: Diagnosis of  By: Linna Darner MD, William    . HYPERTENSION, ESSENTIAL NOS 12/01/2007    Qualifier: Diagnosis of  By: Linna Darner MD, Gwyndolyn Saxon    . T12 compression fracture ([place]) 11/02/2015  . Squamous cell carcinoma of neck ([place]) 03/09/2014    3/15 Dr Wilhemina Bonito     Past Surgical History  Procedure Laterality Date  . Tonsillectomy and adenoidectomy    . Vocal cord polyps    . Thyroid needle biopsy  2002  . Appendectomy    . Cataract extraction      Bilat  . Colonoscopy w/ polypectomy  2005    Diverticulosis;Dr. Sharlett Iles  . Total hip arthroplasty  2008    CHF & RAF post op   . Total shoulder replacement  2010    Dr Durward Fortes - left shoulder  . Mohs surgery  2013    nasal Basal Cell; Dr Sarajane Jews  . Excision metacarpal mass Right 08/17/2013    Procedure: RIGHT LONG EXCISION MASS AND DIP JOINT DEBRIDEMENT;  Surgeon: Tennis Must, MD;  Location: [facility];  Service: Orthopedics;  Laterality: Right;  . Femoral hernia  repair Right 08/17/2014    Procedure: OPEN REPAIR RIGHT FEMORAL HERNIA  WITH INSERTION OF MESH;  Surgeon: Adin Hector, MD;  Location: [facility];  Service: General;  Laterality: Right;  . Insertion of mesh  08/17/2014    Procedure: INSERTION OF MESH;  Surgeon: Adin Hector, MD;  Location: [facility];  Service: General;;  . Inguinal hernia repair Right 11/15/2014    Procedure: LAPAROSCOPIC RIGHT INGUINAL  HERNIA WITH MESH;  Surgeon: Alphonsa Overall, MD;  Location: [facility];  Service: General;  Laterality: Right;   ROS:  Left eye blindness and decreased gait.  As stated in the HPI and negative for all other systems.  PHYSICAL EXAM BP 110/68 mmHg  Pulse 73  Ht 5\' 7"  (1.702 m)  Wt 120 lb 3.2 oz (54.522 kg)  BMI 18.82 kg/m2 GENERAL:  Frail and thin appearing HEENT: Anisocoria, fundi not visualized, oral mucosa unremarkable, mild facial droop NECK:  No jugular venous distention, waveform within normal limits, carotid upstroke brisk and symmetric, right bruit vs transmitted murmur, no thyromegaly LUNGS:  Clear to auscultation bilaterally BACK:  No CVA tenderness CHEST:  Unremarkable HEART:  PMI not displaced or sustained,S1 and S2 within normal limits, no [id number] clicks, no rubs, apical systolic early to mid peaking murmur, irregular ABD:  Flat, positive bowel sounds normal in frequency in pitch, no bruits, no rebound, no guarding, no midline pulsatile mass, no hepatomegaly, no splenomegaly EXT:  2 plus pulses upper and left DP/PT, diminished PT/DP right, mild edema, no cyanosis no clubbing    ASSESSMENT AND PLAN  ATRIAL FIBRILLATION:  The patient tolerates atrial fibrillation.  She is tolerating the Eliquis.    CAROTID STENOSIS:  This is followed by Dr. Leonie Man.  MURMUR:  She has had no change in symptoms or exam. No change in therapy is indicated.  She had mild aortic stenosis on her echo previously.  She has TR secondary to pulm HTN.  No change in therapy  CHRONIC DIASTOLIC HF:  I had a long  discussion with the patient, her husband and daughter about this diagnosis. They now understand the physiology and that this is not systolic failure. She will continue to restrict fluids and watch her salt. She will continue her diuretic. However, there is no change in therapy that I would suggest.  HOSP RECORDS Person

## 2016-06-17 ENCOUNTER — Ambulatory Visit (INDEPENDENT_AMBULATORY_CARE_PROVIDER_SITE_OTHER): Payer: Medicare Other | Admitting: Cardiology

## 2016-06-17 ENCOUNTER — Encounter: Payer: Self-pay | Admitting: Cardiology

## 2016-06-17 VITALS — BP 110/68 | HR 73 | Ht 67.0 in | Wt 120.2 lb

## 2016-06-17 DIAGNOSIS — I5032 Chronic diastolic (congestive) heart failure: Secondary | ICD-10-CM

## 2016-06-17 DIAGNOSIS — I482 Chronic atrial fibrillation, unspecified: Secondary | ICD-10-CM

## 2016-06-17 NOTE — Patient Instructions (Signed)
Your physician wants you to follow-up in: 1 Year. You will receive a reminder letter in the mail two months in advance. If you don't receive a letter, please call our office to schedule the follow-up appointment.  

## 2016-06-20 ENCOUNTER — Other Ambulatory Visit: Payer: Self-pay | Admitting: Internal Medicine

## 2016-07-03 ENCOUNTER — Telehealth: Payer: Self-pay | Admitting: Acute Care

## 2016-07-03 ENCOUNTER — Ambulatory Visit (INDEPENDENT_AMBULATORY_CARE_PROVIDER_SITE_OTHER): Payer: Medicare Other | Admitting: Acute Care

## 2016-07-03 ENCOUNTER — Ambulatory Visit (INDEPENDENT_AMBULATORY_CARE_PROVIDER_SITE_OTHER)
Admission: RE | Admit: 2016-07-03 | Discharge: 2016-07-03 | Disposition: A | Discharge status: Home / Self Care | Payer: Medicare Other | Source: Ambulatory Visit | Attending: Acute Care | Admitting: Acute Care

## 2016-07-03 ENCOUNTER — Encounter: Payer: Self-pay | Admitting: Acute Care

## 2016-07-03 VITALS — BP 122/68 | HR 65 | Ht 67.5 in | Wt 122.8 lb

## 2016-07-03 DIAGNOSIS — J438 Other emphysema: Secondary | ICD-10-CM

## 2016-07-03 DIAGNOSIS — J690 Pneumonitis due to inhalation of food and vomit: Secondary | ICD-10-CM | POA: Diagnosis not present

## 2016-07-03 DIAGNOSIS — J9601 Acute respiratory failure with hypoxia: Secondary | ICD-10-CM | POA: Diagnosis not present

## 2016-07-03 MED ORDER — PANTOPRAZOLE SODIUM 40 MG PO TBEC: 40.0000 mg | DELAYED_RELEASE_TABLET | Freq: Every day | ORAL | Status: DC

## 2016-07-03 MED ORDER — AEROCHAMBER MV MISC: Status: DC

## 2016-07-03 MED ORDER — AMOXICILLIN-POT CLAVULANATE 875-125 MG PO TABS: 1.0000 | ORAL_TABLET | Freq: Two times a day (BID) | ORAL | Status: DC

## 2016-07-03 NOTE — Assessment & Plan Note (Signed)
Slow to resolve aspiration pneumonia Plan: It is good to see you today. We will check a chest xray today.to make sure your pneumonia has resolved. We will call you with results. We will add Protonix 40 mg once daily 30 minutes prior to dinner. Continue wearing your oxygen at 4L [place] at rest and 5L with exertion. Saturation goal is 88-92% Continue your Symbicort 2 puffs into your lungs 2 times a day. We will order an aero chamber today for use with your Symbicort. Please use the swallow techniques you were taught when you are eating to prevent food from getting into your lungs when you swallow. Remember to pace yourself in regard to your activity. Do not ware yourself out. Follow up with Dr. Lake Bells.in 3 months. Please contact office for sooner follow up if symptoms do not improve or worsen or seek emergency care

## 2016-07-03 NOTE — Patient Instructions (Addendum)
It is good to see you today. We will check a chest xray today.to make sure your pneumonia has resolved. We will call you with results. We will add Protonix 40 mg once daily 30 minutes prior to dinner. Continue wearing your oxygen at 4L [place] at rest and 5L with exertion. Saturation goal is 88-92% Continue your Symbicort 2 puffs into your lungs 2 times a day. We will order an aero chamber today for use with your Symbicort. Please use the swallow techniques you were taught when you are eating to prevent food from getting into your lungs when you swallow. Remember to pace yourself in regard to your activity. Do not ware yourself out. Follow up with Dr. Lake Bells.in 3 months. Please contact office for sooner follow up if symptoms do not improve or worsen or seek emergency care

## 2016-07-03 NOTE — Assessment & Plan Note (Addendum)
Resolving COPD exacerbation: Plan: We will check a chest xray today.to make sure your pneumonia has resolved. We will call you with results. We will add Protonix 40 mg once daily 30 minutes prior to dinner. Continue wearing your oxygen at 4L  at rest and 5L with exertion. Saturation goal is 88-92% Continue your Symbicort 2 puffs into your lungs 2 times a day. We will order an aero chamber today for use with your Symbicort. Please use the swallow techniques you were taught when you are eating to prevent food from getting into your lungs when you swallow. Remember to pace yourself in regard to your activity. Do not ware yourself out. Follow up with Kristine Simmons.in 3 months. Please contact office for sooner follow up if symptoms do not improve or worsen or seek emergency care

## 2016-07-03 NOTE — Telephone Encounter (Signed)
Please call Kristine Simmons and let her know that her CXR indicated that her pneumonia has not resolved. I have sent in a prescription for Augmentin 875/125 one tablet twice daily for 7 days. She will need to take this antibiotic with a probiotic She will need a follow up appointment in 2-3 weeks with either me or Dr. Lake Bells with a CXR and  CBC with diff.prior. Thanks

## 2016-07-03 NOTE — Assessment & Plan Note (Signed)
Continue wearing oxygen at 4L [place] with rest and 5L [place] with exertion

## 2016-07-03 NOTE — Progress Notes (Signed)
History of Present Illness Kristine Simmons is a 82 y.o. female with Gold Grade A COPD with severe hypoxemia 2/2 recurrent aspiration/ basilar pulmonanary scarring.She is followed by Dr. Lake Bells.   7/12/2017Hospital Follow Up: Pt. Returns today for hospital follow up. She was treated for aspiration pneumonia with  Steroids ,scheduled BD's,Zosyn, Azithromycin and was discharged home on Vantin, which she has completed.She is not a good historian regarding how she is feeling. She presented to the office today and on 4L she had saturations of 85% which increased to 92% with increase to 5 L [place]. She has been on outings with her husband for up to 4 hours each day, which make her tired.She states that she feels less steady on her feet. She is interested in home health physical therapy to improve her staediness on her feet. She is currently coughing up thick to stringy secretions. Secretions are  clear. She has no fever, chest pain,, purulent secretion, orthopnea or hemoptysis. However prior to admission with pneumonia she did not have a fever.She states she is not using her rescue inhaler at all. She states that she coughs a lot with eating. She has had multiple swallow evaluations done and has been given techniques to use to prevent aspiration. She is not following any of these per her daughter.She is not interested in changing her diet. Her daughter feels that she forgets she is supposed to use special techniques while eating as a result of worsening dementia.   Follow UP CXR  07/03/2016 in the Office:  IMPRESSION: Severe COPD with scarring.  Patchy bibasilar airspace disease unchanged most consistent with pneumonia   Significant Events:  Admit date: 06/10/2016 Discharge date: 06/13/2016  Problem List Active Problems:  Hypothyroidism  Atrial fibrillation (HCC)  COPD, Gold grade A  Glaucoma  Chronic anticoagulation  Acute on chronic respiratory failure with hypoxia (HCC)  Chronic  atrial fibrillation (HCC)  Pneumonia  UTI (urinary tract infection) with e coli  STUDIES:  Chest x-ray images personally reviewed showing interstitial infiltrate in the right, significant disease in the  CULTURES: 06/10/2016 blood culture: negative  Sputum Culture not obtained  ANTIBIOTICS: 06/10/2016 Zosyn 06/10/2016 azithromycin 6/22 Vantin>>  SIGNIFICANT EVENTS: 06/10/2016 admission  Tests CXR 06/03/2016: IMPRESSION: Hyperinflation. Interval clearing of previously seen right basilar airspace opacification.  CXR 06/10/2016 IMPRESSION: Advanced emphysema with developing right greater than left mixed interstitial and airspace opacities. Differential includes multifocal pneumonia with developing left parapneumonic effusion, or potentially edema and pleural effusion.  03/01/2015 Swallow Evaluation: Pt demonstrated a mild pharyngeal dysphagia with delayed swallow initiaton due to mildly decreased sensation and mild-moderate (moderate in pyriform sinuses) residue from reduced tongue base retraction and decreased laryngeal elevation. Pt aware of residue inconsistently and performs second swallow to significantly reduce residue. One episode of flash laryngeal penetration with cup sip thin (toward end of study). Esophagus scanned showing barium residual in distal esophagus with delayed clearance. Recommend pt continue regular texture (using caution with known problematic foods, tender meats with gravy, caution with breads) and thin liquids, small straw sips allowed although educated pt on increased risk with straws, meds whole in applesauce. Educated/review esophageal precautions with pt and provided written handout.                Past medical hx Past Medical History  Diagnosis Date  . Hx of colonic polyps     last colonoscopy 2005, repeat was due 2010 Dr. Sharlett Iles  . Atrial fibrillation ([place])     Dr Percival Spanish  .  Fracture 1995, 2009    RUE; LUE Dr. Durward Fortes    . 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 Cystitis   . Complication of anesthesia     difficulty with arousal post op  . Hyperlipidemia   . Osteoporosis   . Cerebrovascular accident ([place]) 01/09  . Hypothyroidism   . Spinal stenosis of lumbar region 01/10/2016  . Acute diastolic heart failure ([place]) 11/03/2015  . Aortic stenosis, mild 08/15/2014    Echo June, 2015, mild aortic stenosis   . Cardiorenal syndrome 11/05/2015  . Chronic anticoagulation 08/15/2014    Eliquis for A fib   . COPD, Gold grade A 04/08/2008    January 2016 pulmonary function testing> ratio 58% FEV1 1.83L (88% pred, no change with BD), TLC 5.78L (103% pred), DLCO 9.01 (31% pred) 07/2015 ONO RA > spO2 79% average 07/2015 ONO 2LPM> < 88% 189 min 08/2015 ONO 3LPM < 88% 69.8 min 09/2015 ONO 4LPM > OK   . CAROTID STENOSIS 08/08/2009    Qualifier: Diagnosis of  By: Percival Spanish, MD, Farrel Gordon    . Depression 09/22/2014  . G E R D 06/02/2007    Qualifier: Diagnosis of  By: Linna Darner MD, William    . HYPERTENSION, ESSENTIAL NOS 12/01/2007    Qualifier: Diagnosis of  By: Linna Darner MD, Gwyndolyn Saxon    . T12 compression fracture ([place]) 11/02/2015  . Squamous cell carcinoma of neck ([place]) 03/09/2014    3/15 Dr Wilhemina Bonito      Past surgical hx, Family hx, Social hx all reviewed.  Current Outpatient Prescriptions on File Prior to Visit  Medication Sig  . albuterol (VENTOLIN HFA) 108 (90 BASE) MCG/ACT inhaler Inhale 2 puffs into the lungs every 4 (four) hours as needed for wheezing or shortness of breath.   Marland Kitchen apraclonidine (IOPIDINE) 0.5 % ophthalmic solution Place 1 drop into the right eye every 12 (twelve) hours.  . budesonide-formoterol (SYMBICORT) 80-4.5 MCG/ACT inhaler Inhale 2 puffs into the lungs 2 (two) times daily.  Marland Kitchen DIGOX 125 MCG tablet TAKE 1 TABLET ONCE DAILY. (Patient taking differently: TAKE 125 MCG BY MOUTH DAILY)  . diltiazem (CARDIZEM CD) 120 MG 24 hr capsule TAKE (1) CAPSULE DAILY. (Patient taking differently: TAKE   120 MG BY MOUTH  DAILY.)  . ELIQUIS 2.5 MG TABS tablet TAKE 1 TABLET TWICE DAILY. (Patient taking differently: TAKE 2.5 MG BY MOUTH TWICE DAILY.)  . ENSURE PLUS (ENSURE PLUS) LIQD TAKE ONE CAN BY MOUTH ONCE DAILY  . estradiol (ESTRACE) 0.1 MG/GM vaginal cream Place 2 g vaginally 2 (two) times a week. On Wednesdays & Saturdays.  . furosemide (LASIX) 40 MG tablet Take 1 tablet (40 mg total) by mouth daily.  Marland Kitchen levothyroxine (SYNTHROID, LEVOTHROID) 50 MCG tablet TAKE 1 TABLET ONCE DAILY.  Marland Kitchen OXYGEN Inhale into the lungs continuous.  . polyethylene glycol powder (GLYCOLAX/MIRALAX) powder Take 17 g by mouth daily. Mix 1 capful of powder and dissolve it in any liquid. Drink once daily.  . Probiotic Product (MISC INTESTINAL FLORA REGULAT) CAPS Take 1 capsule by mouth daily.    Marland Kitchen Propylene Glycol (SYSTANE BALANCE OP) Place 1 drop into both eyes as needed (dry and pain).  Marland Kitchen sertraline (ZOLOFT) 25 MG tablet Take 1 tablet (25 mg total) by mouth daily. In addition to 50 mg daily for a total of 75 mg daily  . sertraline (ZOLOFT) 50 MG tablet Take 1 tablet (50 mg total) by mouth daily.  . timolol (TIMOPTIC) 0.5 %  ophthalmic solution Place 1 drop into both eyes 2 (two) times daily.   Marland Kitchen trimethoprim (TRIMPEX) 100 MG tablet Take 100 mg by mouth every other day.   Marland Kitchen VYTORIN 10-40 MG tablet TAKE 1 TABLET ONCE DAILY.   No current facility-administered medications on file prior to visit.     Allergies  Allergen Reactions  . Hydrocodone Nausea And Vomiting  . Desmopressin Acetate     REACTION: swelling feet  . Dorzolamide Hcl Other (See Comments)    Burning in eyes and redness  . Prempro [Conj Estrog-Medroxyprogest Ace]     unknown  . Ciprofloxacin     REACTION: vaginal infection    Review Of Systems:  Constitutional:   No  weight loss, night sweats,  Fevers, chills, fatigue, or  lassitude.  HEENT:   No headaches, + Difficulty swallowing,  Tooth/dental problems, or  Sore throat,                No  sneezing, itching, ear ache, nasal congestion, post nasal drip,   CV:  No chest pain,  Orthopnea, PND, swelling in lower extremities, anasarca, dizziness, palpitations, syncope.   GI  No heartburn, indigestion, abdominal pain, nausea, vomiting, diarrhea, change in bowel habits, loss of appetite, bloody stools.   Resp: + shortness of breath with exertion less at rest.  + excess mucus, + productive cough,  No non-productive cough,  No coughing up of blood.  + change in color of mucus.  No wheezing.  No chest wall deformity  Skin: no rash or lesions.  GU: no dysuria, change in color of urine, no urgency or frequency.  No flank pain, no hematuria   MS:  No joint pain or swelling.  No decreased range of motion.  No back pain.  Psych:  No change in mood or affect. No depression or anxiety.  + memory loss, impatient.   Vital Signs BP 122/68 mmHg  Pulse 65  Ht 5' 7.5" (1.715 m)  Wt 122 lb 12.8 oz (55.702 kg)  BMI 18.94 kg/m2  SpO2 92%   Physical Exam:  General- No distress,  A&Ox3, frail elderly female ENT: No sinus tenderness, TM clear, pale nasal mucosa, no oral exudate,no post nasal drip, no LAN Cardiac: S1, S2, regular rate and rhythm, no murmur Chest: No wheeze/ rales/ diminished per bases bilaterally; + rhonchi, no accessory muscle use, no nasal flaring, no sternal retractions Abd.: Soft Non-tender Ext: No clubbing cyanosis, 2+ edema R>L Neuro:  normal strength Skin: No rashes, warm and dry Psych: normal mood and behavior   Assessment/Plan  COPD, Gold grade A Resolving COPD exacerbation: Plan: We will check a chest xray today.to make sure your pneumonia has resolved. We will call you with results. We will add Protonix 40 mg once daily 30 minutes prior to dinner. Continue wearing your oxygen at 4L [place] at rest and 5L with exertion. Saturation goal is 88-92% Continue your Symbicort 2 puffs into your lungs 2 times a day. We will order an aero chamber today for use with your  Symbicort. Please use the swallow techniques you were taught when you are eating to prevent food from getting into your lungs when you swallow. Remember to pace yourself in regard to your activity. Do not ware yourself out. Follow up with Dr. Lake Bells.in 3 months. Please contact office for sooner follow up if symptoms do not improve or worsen or seek emergency care     Aspiration pneumonia ([place]) Slow to resolve aspiration pneumonia Plan: It is  good to see you today. We will check a chest xray today.to make sure your pneumonia has resolved. We will call you with results. We will add Protonix 40 mg once daily 30 minutes prior to dinner. Continue wearing your oxygen at 4L [place] at rest and 5L with exertion. Saturation goal is 88-92% Continue your Symbicort 2 puffs into your lungs 2 times a day. We will order an aero chamber today for use with your Symbicort. Please use the swallow techniques you were taught when you are eating to prevent food from getting into your lungs when you swallow. Remember to pace yourself in regard to your activity. Do not ware yourself out. Follow up with Dr. Lake Bells.in 3 months. Please contact office for sooner follow up if symptoms do not improve or worsen or seek emergency care    Acute respiratory failure with hypoxia ([place]) Continue wearing oxygen at 4L [place] with rest and 5L [place] with exertion     Magdalen Spatz, NP 07/03/2016  10:42 PM

## 2016-07-04 NOTE — Telephone Encounter (Signed)
Spoke with patient- aware of rec's per SG appt scheduled for 2-3 f/u with SG at 11:30 on 07/24/16. Nothing further needed.

## 2016-07-06 ENCOUNTER — Emergency Department (HOSPITAL_COMMUNITY): Payer: Medicare Other

## 2016-07-06 ENCOUNTER — Encounter (HOSPITAL_COMMUNITY): Payer: Self-pay | Admitting: Emergency Medicine

## 2016-07-06 ENCOUNTER — Inpatient Hospital Stay (HOSPITAL_COMMUNITY): Payer: Medicare Other

## 2016-07-06 ENCOUNTER — Inpatient Hospital Stay (HOSPITAL_COMMUNITY)
Admission: EM | Admit: 2016-07-06 | Discharge: 2016-07-11 | DRG: 291 | Disposition: A | Discharge status: Home / Self Care | Payer: Medicare Other | Attending: Family Medicine | Admitting: Family Medicine

## 2016-07-06 DIAGNOSIS — Z96649 Presence of unspecified artificial hip joint: Secondary | ICD-10-CM | POA: Diagnosis present

## 2016-07-06 DIAGNOSIS — Z881 Allergy status to other antibiotic agents status: Secondary | ICD-10-CM

## 2016-07-06 DIAGNOSIS — Z8041 Family history of malignant neoplasm of ovary: Secondary | ICD-10-CM

## 2016-07-06 DIAGNOSIS — I69398 Other sequelae of cerebral infarction: Secondary | ICD-10-CM

## 2016-07-06 DIAGNOSIS — Z9181 History of falling: Secondary | ICD-10-CM

## 2016-07-06 DIAGNOSIS — R627 Adult failure to thrive: Secondary | ICD-10-CM | POA: Diagnosis present

## 2016-07-06 DIAGNOSIS — J438 Other emphysema: Secondary | ICD-10-CM | POA: Diagnosis present

## 2016-07-06 DIAGNOSIS — I272 Other secondary pulmonary hypertension: Secondary | ICD-10-CM | POA: Diagnosis present

## 2016-07-06 DIAGNOSIS — J9620 Acute and chronic respiratory failure, unspecified whether with hypoxia or hypercapnia: Secondary | ICD-10-CM | POA: Diagnosis present

## 2016-07-06 DIAGNOSIS — Z8589 Personal history of malignant neoplasm of other organs and systems: Secondary | ICD-10-CM

## 2016-07-06 DIAGNOSIS — Z86010 Personal history of colonic polyps: Secondary | ICD-10-CM

## 2016-07-06 DIAGNOSIS — E039 Hypothyroidism, unspecified: Secondary | ICD-10-CM | POA: Diagnosis present

## 2016-07-06 DIAGNOSIS — I2781 Cor pulmonale (chronic): Secondary | ICD-10-CM | POA: Diagnosis present

## 2016-07-06 DIAGNOSIS — H5442 Blindness, left eye, normal vision right eye: Secondary | ICD-10-CM | POA: Diagnosis present

## 2016-07-06 DIAGNOSIS — W19XXXA Unspecified fall, initial encounter: Secondary | ICD-10-CM

## 2016-07-06 DIAGNOSIS — I130 Hypertensive heart and chronic kidney disease with heart failure and stage 1 through stage 4 chronic kidney disease, or unspecified chronic kidney disease: Principal | ICD-10-CM | POA: Diagnosis present

## 2016-07-06 DIAGNOSIS — Z79899 Other long term (current) drug therapy: Secondary | ICD-10-CM

## 2016-07-06 DIAGNOSIS — J90 Pleural effusion, not elsewhere classified: Secondary | ICD-10-CM | POA: Insufficient documentation

## 2016-07-06 DIAGNOSIS — Z888 Allergy status to other drugs, medicaments and biological substances status: Secondary | ICD-10-CM

## 2016-07-06 DIAGNOSIS — Z96612 Presence of left artificial shoulder joint: Secondary | ICD-10-CM | POA: Diagnosis present

## 2016-07-06 DIAGNOSIS — I482 Chronic atrial fibrillation: Secondary | ICD-10-CM | POA: Diagnosis present

## 2016-07-06 DIAGNOSIS — J9621 Acute and chronic respiratory failure with hypoxia: Secondary | ICD-10-CM | POA: Diagnosis present

## 2016-07-06 DIAGNOSIS — R0600 Dyspnea, unspecified: Secondary | ICD-10-CM | POA: Diagnosis present

## 2016-07-06 DIAGNOSIS — H540 Blindness, both eyes: Secondary | ICD-10-CM | POA: Diagnosis present

## 2016-07-06 DIAGNOSIS — R0781 Pleurodynia: Secondary | ICD-10-CM | POA: Diagnosis not present

## 2016-07-06 DIAGNOSIS — N189 Chronic kidney disease, unspecified: Secondary | ICD-10-CM | POA: Diagnosis present

## 2016-07-06 DIAGNOSIS — F329 Major depressive disorder, single episode, unspecified: Secondary | ICD-10-CM | POA: Diagnosis present

## 2016-07-06 DIAGNOSIS — Z803 Family history of malignant neoplasm of breast: Secondary | ICD-10-CM

## 2016-07-06 DIAGNOSIS — Z7901 Long term (current) use of anticoagulants: Secondary | ICD-10-CM

## 2016-07-06 DIAGNOSIS — R0602 Shortness of breath: Secondary | ICD-10-CM

## 2016-07-06 DIAGNOSIS — Z823 Family history of stroke: Secondary | ICD-10-CM

## 2016-07-06 DIAGNOSIS — I350 Nonrheumatic aortic (valve) stenosis: Secondary | ICD-10-CM | POA: Diagnosis present

## 2016-07-06 DIAGNOSIS — H409 Unspecified glaucoma: Secondary | ICD-10-CM | POA: Diagnosis present

## 2016-07-06 DIAGNOSIS — J449 Chronic obstructive pulmonary disease, unspecified: Secondary | ICD-10-CM | POA: Insufficient documentation

## 2016-07-06 DIAGNOSIS — K5909 Other constipation: Secondary | ICD-10-CM | POA: Diagnosis present

## 2016-07-06 DIAGNOSIS — I5033 Acute on chronic diastolic (congestive) heart failure: Secondary | ICD-10-CM

## 2016-07-06 DIAGNOSIS — I4891 Unspecified atrial fibrillation: Secondary | ICD-10-CM | POA: Diagnosis present

## 2016-07-06 DIAGNOSIS — Z8249 Family history of ischemic heart disease and other diseases of the circulatory system: Secondary | ICD-10-CM

## 2016-07-06 DIAGNOSIS — Z801 Family history of malignant neoplasm of trachea, bronchus and lung: Secondary | ICD-10-CM

## 2016-07-06 DIAGNOSIS — I10 Essential (primary) hypertension: Secondary | ICD-10-CM | POA: Diagnosis present

## 2016-07-06 DIAGNOSIS — Z833 Family history of diabetes mellitus: Secondary | ICD-10-CM

## 2016-07-06 DIAGNOSIS — R0902 Hypoxemia: Secondary | ICD-10-CM | POA: Diagnosis present

## 2016-07-06 DIAGNOSIS — M810 Age-related osteoporosis without current pathological fracture: Secondary | ICD-10-CM | POA: Diagnosis present

## 2016-07-06 DIAGNOSIS — W010XXA Fall on same level from slipping, tripping and stumbling without subsequent striking against object, initial encounter: Secondary | ICD-10-CM | POA: Diagnosis present

## 2016-07-06 DIAGNOSIS — Z885 Allergy status to narcotic agent status: Secondary | ICD-10-CM

## 2016-07-06 DIAGNOSIS — Z806 Family history of leukemia: Secondary | ICD-10-CM

## 2016-07-06 DIAGNOSIS — Z7951 Long term (current) use of inhaled steroids: Secondary | ICD-10-CM

## 2016-07-06 DIAGNOSIS — Z87891 Personal history of nicotine dependence: Secondary | ICD-10-CM

## 2016-07-06 DIAGNOSIS — Z9981 Dependence on supplemental oxygen: Secondary | ICD-10-CM

## 2016-07-06 DIAGNOSIS — I5031 Acute diastolic (congestive) heart failure: Secondary | ICD-10-CM | POA: Diagnosis present

## 2016-07-06 DIAGNOSIS — Z8701 Personal history of pneumonia (recurrent): Secondary | ICD-10-CM

## 2016-07-06 DIAGNOSIS — E785 Hyperlipidemia, unspecified: Secondary | ICD-10-CM | POA: Diagnosis present

## 2016-07-06 DIAGNOSIS — Z66 Do not resuscitate: Secondary | ICD-10-CM | POA: Diagnosis present

## 2016-07-06 DIAGNOSIS — Z800 Family history of malignant neoplasm of digestive organs: Secondary | ICD-10-CM

## 2016-07-06 LAB — URINALYSIS, ROUTINE W REFLEX MICROSCOPIC
Bilirubin Urine: NEGATIVE
GLUCOSE, UA: NEGATIVE mg/dL
Hgb urine dipstick: NEGATIVE
Ketones, ur: NEGATIVE mg/dL
LEUKOCYTES UA: NEGATIVE
Nitrite: NEGATIVE
PROTEIN: NEGATIVE mg/dL
Specific Gravity, Urine: 1.008 (ref 1.005–1.030)
pH: 7 (ref 5.0–8.0)

## 2016-07-06 LAB — COMPREHENSIVE METABOLIC PANEL
ALBUMIN: 4 g/dL (ref 3.5–5.0)
ALT: 23 U/L (ref 14–54)
ANION GAP: 10 (ref 5–15)
AST: 41 U/L (ref 15–41)
Alkaline Phosphatase: 47 U/L (ref 38–126)
BILIRUBIN TOTAL: 1.2 mg/dL (ref 0.3–1.2)
BUN: 21 mg/dL — ABNORMAL HIGH (ref 6–20)
CHLORIDE: 99 mmol/L — AB (ref 101–111)
CO2: 30 mmol/L (ref 22–32)
Calcium: 9.1 mg/dL (ref 8.9–10.3)
Creatinine, Ser: 1.11 mg/dL — ABNORMAL HIGH (ref 0.44–1.00)
GFR calc Af Amer: 50 mL/min — ABNORMAL LOW (ref >60)
GFR calc non Af Amer: 43 mL/min — ABNORMAL LOW (ref >60)
GLUCOSE: 115 mg/dL — AB (ref 65–99)
POTASSIUM: 3.6 mmol/L (ref 3.5–5.1)
SODIUM: 139 mmol/L (ref 135–145)
TOTAL PROTEIN: 6.9 g/dL (ref 6.5–8.1)

## 2016-07-06 LAB — CBC WITH DIFFERENTIAL/PLATELET
BASOS PCT: 1 %
Basophils Absolute: 0.1 10*3/uL (ref 0.0–0.1)
EOS ABS: 0.1 10*3/uL (ref 0.0–0.7)
Eosinophils Relative: 1 %
HCT: 44.3 % (ref 36.0–46.0)
Hemoglobin: 14.3 g/dL (ref 12.0–15.0)
Lymphocytes Relative: 18 %
Lymphs Abs: 1.4 10*3/uL (ref 0.7–4.0)
MCH: 27.2 pg (ref 26.0–34.0)
MCHC: 32.3 g/dL (ref 30.0–36.0)
MCV: 84.2 fL (ref 78.0–100.0)
MONO ABS: 0.8 10*3/uL (ref 0.1–1.0)
MONOS PCT: 11 %
Neutro Abs: 5.4 10*3/uL (ref 1.7–7.7)
Neutrophils Relative %: 69 %
PLATELETS: 183 10*3/uL (ref 150–400)
RBC: 5.26 MIL/uL — ABNORMAL HIGH (ref 3.87–5.11)
RDW: 18.2 % — AB (ref 11.5–15.5)
WBC: 7.8 10*3/uL (ref 4.0–10.5)

## 2016-07-06 LAB — TROPONIN I: Troponin I: 0.04 ng/mL (ref <0.03)

## 2016-07-06 LAB — BRAIN NATRIURETIC PEPTIDE: B Natriuretic Peptide: 827.2 pg/mL — ABNORMAL HIGH (ref 0.0–100.0)

## 2016-07-06 LAB — DIGOXIN LEVEL: DIGOXIN LVL: 0.8 ng/mL (ref 0.8–2.0)

## 2016-07-06 MED ORDER — TIMOLOL MALEATE 0.5 % OP SOLN
1.0000 [drp] | Freq: Two times a day (BID) | OPHTHALMIC | Status: DC
  Administered 2016-07-06 – 2016-07-11 (×10): 1 [drp] via OPHTHALMIC
  Filled 2016-07-06: qty 5

## 2016-07-06 MED ORDER — SODIUM CHLORIDE 0.9% FLUSH
3.0000 mL | Freq: Two times a day (BID) | INTRAVENOUS | Status: DC
  Administered 2016-07-08 – 2016-07-11 (×7): 3 mL via INTRAVENOUS

## 2016-07-06 MED ORDER — POTASSIUM CHLORIDE CRYS ER 20 MEQ PO TBCR
40.0000 meq | EXTENDED_RELEASE_TABLET | Freq: Once | ORAL | Status: AC
  Administered 2016-07-06: 40 meq via ORAL
  Filled 2016-07-06: qty 2

## 2016-07-06 MED ORDER — POLYETHYLENE GLYCOL 3350 17 G PO PACK
17.0000 g | PACK | Freq: Every day | ORAL | Status: DC
  Administered 2016-07-07 – 2016-07-11 (×5): 17 g via ORAL
  Filled 2016-07-06 (×5): qty 1

## 2016-07-06 MED ORDER — SENNOSIDES-DOCUSATE SODIUM 8.6-50 MG PO TABS
2.0000 | ORAL_TABLET | Freq: Two times a day (BID) | ORAL | Status: DC
  Administered 2016-07-06 – 2016-07-11 (×9): 2 via ORAL
  Filled 2016-07-06 (×10): qty 2

## 2016-07-06 MED ORDER — FUROSEMIDE 10 MG/ML IJ SOLN
40.0000 mg | Freq: Once | INTRAMUSCULAR | Status: AC
  Administered 2016-07-06: 40 mg via INTRAVENOUS
  Filled 2016-07-06: qty 4

## 2016-07-06 MED ORDER — ALBUTEROL SULFATE (2.5 MG/3ML) 0.083% IN NEBU: 3.0000 mL | INHALATION_SOLUTION | RESPIRATORY_TRACT | Status: DC | PRN

## 2016-07-06 MED ORDER — PANTOPRAZOLE SODIUM 40 MG PO TBEC
40.0000 mg | DELAYED_RELEASE_TABLET | Freq: Every day | ORAL | Status: DC
  Administered 2016-07-07 – 2016-07-11 (×5): 40 mg via ORAL
  Filled 2016-07-06 (×5): qty 1

## 2016-07-06 MED ORDER — SERTRALINE HCL 25 MG PO TABS
75.0000 mg | ORAL_TABLET | Freq: Every day | ORAL | Status: DC
  Administered 2016-07-07 – 2016-07-11 (×5): 75 mg via ORAL
  Filled 2016-07-06 (×5): qty 3

## 2016-07-06 MED ORDER — SERTRALINE HCL 50 MG PO TABS: 25.0000 mg | ORAL_TABLET | Freq: Every day | ORAL | Status: DC

## 2016-07-06 MED ORDER — MORPHINE SULFATE (PF) 2 MG/ML IV SOLN
2.0000 mg | INTRAVENOUS | Status: DC | PRN
  Administered 2016-07-06: 2 mg via INTRAVENOUS
  Filled 2016-07-06: qty 1

## 2016-07-06 MED ORDER — DIGOXIN 125 MCG PO TABS
125.0000 ug | ORAL_TABLET | Freq: Every day | ORAL | Status: DC
  Administered 2016-07-07 – 2016-07-10 (×4): 125 ug via ORAL
  Filled 2016-07-06 (×4): qty 1

## 2016-07-06 MED ORDER — RISAQUAD PO CAPS
1.0000 | ORAL_CAPSULE | Freq: Every day | ORAL | Status: DC
  Administered 2016-07-07 – 2016-07-11 (×5): 1 via ORAL
  Filled 2016-07-06 (×5): qty 1

## 2016-07-06 MED ORDER — EZETIMIBE-SIMVASTATIN 10-40 MG PO TABS
1.0000 | ORAL_TABLET | Freq: Every day | ORAL | Status: DC
  Administered 2016-07-07 – 2016-07-11 (×5): 1 via ORAL
  Filled 2016-07-06 (×5): qty 1

## 2016-07-06 MED ORDER — TRIMETHOPRIM 100 MG PO TABS
100.0000 mg | ORAL_TABLET | ORAL | Status: DC
  Administered 2016-07-08 – 2016-07-10 (×2): 100 mg via ORAL
  Filled 2016-07-06 (×2): qty 1

## 2016-07-06 MED ORDER — FUROSEMIDE 10 MG/ML IJ SOLN
40.0000 mg | Freq: Two times a day (BID) | INTRAMUSCULAR | Status: DC
Start: 2016-07-06 — End: 2016-07-09
  Administered 2016-07-06 – 2016-07-09 (×6): 40 mg via INTRAVENOUS
  Filled 2016-07-06 (×6): qty 4

## 2016-07-06 MED ORDER — APRACLONIDINE HCL 0.5 % OP SOLN
1.0000 [drp] | Freq: Two times a day (BID) | OPHTHALMIC | Status: DC
  Administered 2016-07-06 – 2016-07-11 (×10): 1 [drp] via OPHTHALMIC
  Filled 2016-07-06: qty 5

## 2016-07-06 MED ORDER — DILTIAZEM HCL ER COATED BEADS 120 MG PO CP24
120.0000 mg | ORAL_CAPSULE | Freq: Every day | ORAL | Status: DC
  Administered 2016-07-07 – 2016-07-11 (×5): 120 mg via ORAL
  Filled 2016-07-06 (×5): qty 1

## 2016-07-06 MED ORDER — MOMETASONE FURO-FORMOTEROL FUM 100-5 MCG/ACT IN AERO
2.0000 | INHALATION_SPRAY | Freq: Two times a day (BID) | RESPIRATORY_TRACT | Status: DC
  Administered 2016-07-06 – 2016-07-11 (×10): 2 via RESPIRATORY_TRACT
  Filled 2016-07-06: qty 8.8

## 2016-07-06 MED ORDER — SODIUM CHLORIDE 0.9 % IV SOLN
INTRAVENOUS | Status: DC
  Administered 2016-07-06: 20 mL/h via INTRAVENOUS
  Administered 2016-07-06: 10 mL/h via INTRAVENOUS

## 2016-07-06 MED ORDER — POLYETHYLENE GLYCOL 3350 17 GM/SCOOP PO POWD: 17.0000 g | Freq: Every day | ORAL | Status: DC

## 2016-07-06 MED ORDER — APIXABAN 2.5 MG PO TABS
2.5000 mg | ORAL_TABLET | Freq: Two times a day (BID) | ORAL | Status: DC
  Administered 2016-07-06 – 2016-07-11 (×10): 2.5 mg via ORAL
  Filled 2016-07-06 (×10): qty 1

## 2016-07-06 MED ORDER — ESTRADIOL 0.1 MG/GM VA CREA
2.0000 g | TOPICAL_CREAM | VAGINAL | Status: DC
  Administered 2016-07-06 – 2016-07-10 (×2): 2 g via VAGINAL
  Filled 2016-07-06: qty 42.5

## 2016-07-06 MED ORDER — CALCIUM CARBONATE-VITAMIN D 500-200 MG-UNIT PO TABS
2.0000 | ORAL_TABLET | Freq: Every day | ORAL | Status: DC
  Administered 2016-07-07 – 2016-07-11 (×5): 2 via ORAL
  Filled 2016-07-06 (×5): qty 2

## 2016-07-06 MED ORDER — LEVOTHYROXINE SODIUM 50 MCG PO TABS
50.0000 ug | ORAL_TABLET | Freq: Every day | ORAL | Status: DC
  Administered 2016-07-07 – 2016-07-11 (×5): 50 ug via ORAL
  Filled 2016-07-06 (×5): qty 1

## 2016-07-06 NOTE — ED Notes (Signed)
Admitting MD in with pt    

## 2016-07-06 NOTE — ED Notes (Signed)
Pt complaint of fall resulting in left ribcage pain; denies chest pain or LOC. Pt continues to report recent diagnosis of pneumonia not improved. Pt uses 4 lpm [place] at home; hx of COPD.

## 2016-07-06 NOTE — ED Notes (Signed)
Patient transported to X-ray 

## 2016-07-06 NOTE — H&P (Signed)
History and Physical  TRIMEKA GANDER [id number] DOB: 01-18-1927 DOA: 07/06/2016  Referring physician: EDP PCP: Binnie Rail, MD   Chief Complaint:  s/p fall, left rib pain  HPI: Kristine Simmons is a 82 y.o. female  H/o oxygen dependent copd (4-6 liter at baseline), chronic afib on anticoagulation with apixaban, h/o cva with left eye blindness, h/o diastolic chf, h/o aspiration pneumonia who brought to the ED due to fall with left rib pain, she denies palpitation, no chest pain, no syncope,   ED course: cxr did not showed rib fracture,but she is found to be hypoxic, 02 sats 76 on 4liter oxygen. ua unremarkable, labs with elevated bnp at 827, otherwise unremarkable at baseline. he was given lasix, hospitalist called to admit the patient.   Patient currently is accompanied by her daughter, she denies respiratory distress, she does endorse chronic DOE, she report she was hospitalized a few weeks ago for pna, she never got better from it. She went to see her doctor for a hospital follow up on 7/12 and had a repeat cxr done and prescribed augmentin due to "pna has not cleared up yet", she denies fever, no chills, she does has productive cough, she reported has lower extremity edema, but today her legs seems to be ok, she has chronic constipation, she denies dysuria, no ab pain,no dizziness, she ambulate with a walker. With h/o multiple falls.     Review of Systems:  Detail per HPI, Review of systems are otherwise negative  Past Medical History  Diagnosis Date  . Hx of colonic polyps     last colonoscopy 2005, repeat was due 2010 Dr. Sharlett Iles  . Atrial fibrillation ([place])     Dr Percival Spanish  . Fracture 1995, 2009    RUE; LUE Dr. Durward Fortes  . 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 Cystitis   . Complication of anesthesia     difficulty with arousal post op  . Hyperlipidemia   . Osteoporosis   . Cerebrovascular accident ([place]) 01/09  . Hypothyroidism   . Spinal stenosis  of lumbar region 01/10/2016  . Acute diastolic heart failure ([place]) 11/03/2015  . Aortic stenosis, mild 08/15/2014    Echo June, 2015, mild aortic stenosis   . Cardiorenal syndrome 11/05/2015  . Chronic anticoagulation 08/15/2014    Eliquis for A fib   . COPD, Gold grade A 04/08/2008    January 2016 pulmonary function testing> ratio 58% FEV1 1.83L (88% pred, no change with BD), TLC 5.78L (103% pred), DLCO 9.01 (31% pred) 07/2015 ONO RA > spO2 79% average 07/2015 ONO 2LPM> < 88% 189 min 08/2015 ONO 3LPM < 88% 69.8 min 09/2015 ONO 4LPM > OK   . CAROTID STENOSIS 08/08/2009    Qualifier: Diagnosis of  By: Percival Spanish, MD, Farrel Gordon    . Depression 09/22/2014  . G E R D 06/02/2007    Qualifier: Diagnosis of  By: Linna Darner MD, William    . HYPERTENSION, ESSENTIAL NOS 12/01/2007    Qualifier: Diagnosis of  By: Linna Darner MD, Gwyndolyn Saxon    . T12 compression fracture ([place]) 11/02/2015  . Squamous cell carcinoma of neck ([place]) 03/09/2014    3/15 Dr Wilhemina Bonito    Past Surgical History  Procedure Laterality Date  . Tonsillectomy and adenoidectomy    . Vocal cord polyps    . Thyroid needle biopsy  2002  . Appendectomy    . Cataract extraction      Bilat  .  Colonoscopy w/ polypectomy  2005    Diverticulosis;Dr. Sharlett Iles  . Total hip arthroplasty  2008    CHF & RAF post op   . Total shoulder replacement  2010    Dr Durward Fortes - left shoulder  . Mohs surgery  2013    nasal Basal Cell; Dr Sarajane Jews  . Excision metacarpal mass Right 08/17/2013    Procedure: RIGHT LONG EXCISION MASS AND DIP JOINT DEBRIDEMENT;  Surgeon: Tennis Must, MD;  Location: [place];  Service: Orthopedics;  Laterality: Right;  . Femoral hernia repair Right 08/17/2014    Procedure: OPEN REPAIR RIGHT FEMORAL HERNIA  WITH INSERTION OF MESH;  Surgeon: Adin Hector, MD;  Location: [facility];  Service: General;  Laterality: Right;  . Insertion of mesh  08/17/2014    Procedure: INSERTION OF MESH;  Surgeon: Adin Hector, MD;  Location:  [facility];  Service: General;;  . Inguinal hernia repair Right 11/15/2014    Procedure: LAPAROSCOPIC RIGHT INGUINAL HERNIA WITH MESH;  Surgeon: Alphonsa Overall, MD;  Location: [facility];  Service: General;  Laterality: Right;   Social History:  reports that she quit smoking about 22 years ago. Her smoking use included Cigarettes. She has a 45 pack-year smoking history. She has never used smokeless tobacco. She reports that she does not drink alcohol or use illicit drugs. Patient lives at  Home & is able to participate in activities of daily living independently   Allergies  Allergen Reactions  . Hydrocodone Nausea And Vomiting  . Desmopressin Acetate     REACTION: swelling feet  . Dorzolamide Hcl Other (See Comments)    Burning in eyes and redness  . Prempro [Conj Estrog-Medroxyprogest Ace]     unknown  . Ciprofloxacin     REACTION: vaginal infection    Family History  Problem Relation Age of Onset  . Stroke Father     > 67  . Diabetes Father   . Leukemia Mother   . Cancer Mother     leukemia  . Colon cancer Brother     Valve replacement  . Heart attack Brother   . Breast cancer Sister   . Ovarian cancer Sister   . Lung cancer Sister     smoker  . Prostate cancer Brother   . Cancer Brother     bladder      Prior to Admission medications   Medication Sig Start Date End Date Taking? Authorizing Provider  albuterol (VENTOLIN HFA) 108 (90 BASE) MCG/ACT inhaler Inhale 2 puffs into the lungs every 4 (four) hours as needed for wheezing or shortness of breath.    Yes Historical Provider, MD  amoxicillin-clavulanate (AUGMENTIN) 875-125 MG tablet Take 1 tablet by mouth 2 (two) times daily. 07/04/16 07/11/16 Yes Magdalen Spatz, NP  apraclonidine (IOPIDINE) 0.5 % ophthalmic solution Place 1 drop into the right eye every 12 (twelve) hours.   Yes Historical Provider, MD  budesonide-formoterol (SYMBICORT) 80-4.5 MCG/ACT inhaler Inhale 2 puffs into the lungs 2 (two) times daily. 02/02/16  Yes  Juanito Doom, MD  calcium-vitamin D (OSCAL WITH D) 500-200 MG-UNIT tablet Take 2 tablets by mouth daily with breakfast.   Yes Historical Provider, MD  [place] 125 MCG tablet TAKE 1 TABLET ONCE DAILY. 05/26/15  Yes Minus Breeding, MD  diltiazem (CARDIZEM CD) 120 MG 24 hr capsule TAKE (1) CAPSULE DAILY. 05/27/16  Yes Minus Breeding, MD  ELIQUIS 2.5 MG TABS tablet TAKE 1 TABLET TWICE DAILY. 05/18/16  Yes Minus Breeding,  MD  ENSURE PLUS (ENSURE PLUS) LIQD TAKE ONE CAN BY MOUTH ONCE DAILY 02/13/16  Yes Binnie Rail, MD  estradiol (ESTRACE) 0.1 MG/GM vaginal cream Place 2 g vaginally 2 (two) times a week. On Wednesdays & Saturdays.   Yes Historical Provider, MD  furosemide (LASIX) 40 MG tablet Take 1 tablet (40 mg total) by mouth daily. 01/10/16  Yes Biagio Borg, MD  levothyroxine (SYNTHROID, LEVOTHROID) 50 MCG tablet TAKE 1 TABLET ONCE DAILY. 06/21/16  Yes Binnie Rail, MD  OXYGEN Inhale into the lungs continuous. Sitting or resting flow should be on 4 L  And during activities use 5-6 L   Yes Historical Provider, MD  pantoprazole (PROTONIX) 40 MG tablet Take 1 tablet (40 mg total) by mouth daily. 43mins prior to dinner 07/03/16  Yes Magdalen Spatz, NP  polyethylene glycol powder (GLYCOLAX/MIRALAX) powder Take 17 g by mouth daily. Mix 1 capful of powder and dissolve it in any liquid. Drink once daily. 10/30/15  Yes Forde Dandy, MD  Probiotic Product (MISC INTESTINAL FLORA REGULAT) CAPS Take 1 capsule by mouth daily.     Yes Historical Provider, MD  sertraline (ZOLOFT) 25 MG tablet Take 1 tablet (25 mg total) by mouth daily. In addition to 50 mg daily for a total of 75 mg daily 04/04/16  Yes Binnie Rail, MD  sertraline (ZOLOFT) 50 MG tablet Take 1 tablet (50 mg total) by mouth daily. 01/10/16  Yes Biagio Borg, MD  Spacer/Aero-Holding Chambers (AEROCHAMBER MV) inhaler Use as instructed 07/03/16  Yes Magdalen Spatz, NP  timolol (TIMOPTIC) 0.5 % ophthalmic solution Place 1 drop into both eyes 2 (two) times daily.     Yes Historical Provider, MD  trimethoprim (TRIMPEX) 100 MG tablet Take 100 mg by mouth every other day.    Yes Historical Provider, MD  VYTORIN 10-40 MG tablet TAKE 1 TABLET ONCE DAILY. 06/11/16  Yes Binnie Rail, MD    Physical Exam: BP 130/65 mmHg  Pulse 70  Temp(Src) 97.8 F (36.6 C) (Oral)  Resp 14  SpO2 85%  General:  Frail, NAD, Eyes: left eye blindness from prior cva ENT: unremarkable Neck: supple, no JVD Cardiovascular: IRRR, rate controlled Respiratory: diminished at basis, no obvious crackles, no wheezing,  Abdomen: soft/ND/ND, positive bowel sounds Skin: no rash Musculoskeletal:  trace edema Psychiatric: calm/cooperative Neurologic: aaox3, left eye blindness from prior cva          Labs on Admission:  Basic Metabolic Panel:  Recent Labs Lab 07/06/16 1315  NA 139  K 3.6  CL 99*  CO2 30  GLUCOSE 115*  BUN 21*  CREATININE 1.11*  CALCIUM 9.1   Liver Function Tests:  Recent Labs Lab 07/06/16 1315  AST 41  ALT 23  ALKPHOS 47  BILITOT 1.2  PROT 6.9  ALBUMIN 4.0   No results for input(s): LIPASE, AMYLASE in the last 168 hours. No results for input(s): AMMONIA in the last 168 hours. CBC:  Recent Labs Lab 07/06/16 1315  WBC 7.8  NEUTROABS 5.4  HGB 14.3  HCT 44.3  MCV 84.2  PLT 183   Cardiac Enzymes:  Recent Labs Lab 07/06/16 1315  TROPONINI 0.04*    BNP (last 3 results)  Recent Labs  11/02/15 1619 06/10/16 1356 07/06/16 1315  BNP 1237.6* 734.1* 827.2*    ProBNP (last 3 results) No results for input(s): PROBNP in the last 8760 hours.  CBG: No results for input(s): GLUCAP in the last 168 hours.  Radiological Exams on Admission: Dg Ribs Unilateral W/chest Left  07/06/2016  CLINICAL DATA:  Fall with left rib cage pain. EXAM: LEFT RIBS AND CHEST - 3+ VIEW COMPARISON:  07/03/2016 FINDINGS: Hyperexpansion is consistent with emphysema. Interstitial markings are diffusely coarsened with chronic features. Bibasilar atelectasis or  infiltrate with small bilateral pleural effusions, new in the interval. Radio-opaque marker has been placed on the skin at the site of patient concern. Oblique views of the left ribs show no evidence for an acute displaced left-sided rib fracture. Patient is status post left shoulder replacement. IMPRESSION: Emphysema with bibasilar atelectasis or infiltrate and small bilateral pleural effusions. No evidence for displaced acute left-sided rib fracture. Electronically Signed   By: Misty Stanley M.D.   On: 07/06/2016 14:07    EKG: Independently reviewed. Chronic afib, rate controlled,no acute changes  Assessment/Plan Present on Admission:  **None**  Acute on chronic hypoxic respiratory failure: she is on 4-6 liters oxygen supplement at home, she is found to be hypoxic o2sats 76 on 4liters initially. She does not look septic, does not look significant fluids overload on exam, so i ordered a CT chest which did not review pna but did showed pleural effusion. Will d/c abx.  Diastolic chf exacerbation/bilateral pleural effusion: lasix 40mg  bid, continue other home meds for heart failure.   Afib, currently rate controlled , continue home meds apixaban /digoxin/ ccb, will check dig level.  Copd: stable, no wheezing, continue home meds,  Continue o2 .  FTT/weakness/falls: will get PT, will likely need home health  DVT prophylaxis: on apixaban  Consultants: none  Code Status: partial, she is clear that she does not  Want to be on ventilator,  There are several documentation about DNR status, today she is not so clear about the rest, daughter states patient has an established living will but she could not remember the detail, she will talker to her father to verify code status  Family Communication:  Patient and daughter  Disposition Plan: admit to med tele/obs, anticipate d/c home in am  Time spent: 1mins  Kelsey Edman MD, PhD [facility] Pager [phone] If 7PM-7AM, please contact  night-coverage at [email or web address], password [facility]

## 2016-07-06 NOTE — ED Provider Notes (Signed)
CSN: [id number]     Arrival date & time 07/06/16  1237 History   First MD Initiated Contact with Patient 07/06/16 1251     Chief Complaint  Patient presents with  . Fall  . Ribcage Pain   . Pneumonia     (Consider location/radiation/quality/duration/timing/severity/associated sxs/prior Treatment) HPI Comments: 82 year old female presents complaining of left-sided rib pain that is result of a fall. Patient was sitting up as appropriate yesterday when she lost her balance and fell onto the left rib cage. Patient fell onto a carpeted floor for approximately 1-2 feet. Did strike the left side of her head but did not have a loss of consciousness. She denies any bruising to her head. Has complained of sharp pain to her left chest is worse with taking deep breath. She is chronically on oxygen at 4 L due to history of COPD. No cough or congestion from this incident but is currently being treated for pneumonia. Denies any fever or chills. No recent blood loss.  Patient is a 82 y.o. female presenting with fall and pneumonia. The history is provided by the patient and a relative.  Fall  Pneumonia    Past Medical History  Diagnosis Date  . Hx of colonic polyps     last colonoscopy 2005, repeat was due 2010 Dr. Sharlett Iles  . Atrial fibrillation ([place])     Dr Percival Spanish  . Fracture 1995, 2009    RUE; LUE Dr. Durward Fortes  . 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 Cystitis   . Complication of anesthesia     difficulty with arousal post op  . Hyperlipidemia   . Osteoporosis   . Cerebrovascular accident ([place]) 01/09  . Hypothyroidism   . Spinal stenosis of lumbar region 01/10/2016  . Acute diastolic heart failure ([place]) 11/03/2015  . Aortic stenosis, mild 08/15/2014    Echo June, 2015, mild aortic stenosis   . Cardiorenal syndrome 11/05/2015  . Chronic anticoagulation 08/15/2014    Eliquis for A fib   . COPD, Gold grade A 04/08/2008    January 2016 pulmonary function testing> ratio  58% FEV1 1.83L (88% pred, no change with BD), TLC 5.78L (103% pred), DLCO 9.01 (31% pred) 07/2015 ONO RA > spO2 79% average 07/2015 ONO 2LPM> < 88% 189 min 08/2015 ONO 3LPM < 88% 69.8 min 09/2015 ONO 4LPM > OK   . CAROTID STENOSIS 08/08/2009    Qualifier: Diagnosis of  By: Percival Spanish, MD, Farrel Gordon    . Depression 09/22/2014  . G E R D 06/02/2007    Qualifier: Diagnosis of  By: Linna Darner MD, William    . HYPERTENSION, ESSENTIAL NOS 12/01/2007    Qualifier: Diagnosis of  By: Linna Darner MD, Gwyndolyn Saxon    . T12 compression fracture ([place]) 11/02/2015  . Squamous cell carcinoma of neck ([place]) 03/09/2014    3/15 Dr Wilhemina Bonito    Past Surgical History  Procedure Laterality Date  . Tonsillectomy and adenoidectomy    . Vocal cord polyps    . Thyroid needle biopsy  2002  . Appendectomy    . Cataract extraction      Bilat  . Colonoscopy w/ polypectomy  2005    Diverticulosis;Dr. Sharlett Iles  . Total hip arthroplasty  2008    CHF & RAF post op   . Total shoulder replacement  2010    Dr Durward Fortes - left shoulder  . Mohs surgery  2013    nasal Basal Cell; Dr Sarajane Jews  .  Excision metacarpal mass Right 08/17/2013    Procedure: RIGHT LONG EXCISION MASS AND DIP JOINT DEBRIDEMENT;  Surgeon: Tennis Must, MD;  Location: [facility];  Service: Orthopedics;  Laterality: Right;  . Femoral hernia repair Right 08/17/2014    Procedure: OPEN REPAIR RIGHT FEMORAL HERNIA  WITH INSERTION OF MESH;  Surgeon: Adin Hector, MD;  Location: [facility];  Service: General;  Laterality: Right;  . Insertion of mesh  08/17/2014    Procedure: INSERTION OF MESH;  Surgeon: Adin Hector, MD;  Location: [facility];  Service: General;;  . Inguinal hernia repair Right 11/15/2014    Procedure: LAPAROSCOPIC RIGHT INGUINAL HERNIA WITH MESH;  Surgeon: Alphonsa Overall, MD;  Location: [facility];  Service: General;  Laterality: Right;   Family History  Problem Relation Age of Onset  . Stroke Father     > 7  . Diabetes Father   . Leukemia Mother    . Cancer Mother     leukemia  . Colon cancer Brother     Valve replacement  . Heart attack Brother   . Breast cancer Sister   . Ovarian cancer Sister   . Lung cancer Sister     smoker  . Prostate cancer Brother   . Cancer Brother     bladder   Social History  Substance Use Topics  . Smoking status: Former Smoker -- 1.00 packs/day for 45 years    Types: Cigarettes    Quit date: 12/23/1993  . Smokeless tobacco: Never Used     Comment: Smoked 1950-1995, up to 1 ppd  . Alcohol Use: No   OB History    No data available     Review of Systems  All other systems reviewed and are negative.     Allergies  Hydrocodone; Desmopressin acetate; Dorzolamide hcl; Prempro; and Ciprofloxacin  Home Medications   Prior to Admission medications   Medication Sig Start Date End Date Taking? Authorizing Provider  albuterol (VENTOLIN HFA) 108 (90 BASE) MCG/ACT inhaler Inhale 2 puffs into the lungs every 4 (four) hours as needed for wheezing or shortness of breath.     Historical Provider, MD  amoxicillin-clavulanate (AUGMENTIN) 875-125 MG tablet Take 1 tablet by mouth 2 (two) times daily. 07/04/16 07/11/16  Magdalen Spatz, NP  apraclonidine (IOPIDINE) 0.5 % ophthalmic solution Place 1 drop into the right eye every 12 (twelve) hours.    Historical Provider, MD  budesonide-formoterol (SYMBICORT) 80-4.5 MCG/ACT inhaler Inhale 2 puffs into the lungs 2 (two) times daily. 02/02/16   Juanito Doom, MD  DIGOX 125 MCG tablet TAKE 1 TABLET ONCE DAILY. Patient taking differently: TAKE 125 MCG BY MOUTH DAILY 05/26/15   Minus Breeding, MD  diltiazem (CARDIZEM CD) 120 MG 24 hr capsule TAKE (1) CAPSULE DAILY. Patient taking differently: TAKE  120 MG BY MOUTH  DAILY. 05/27/16   Minus Breeding, MD  ELIQUIS 2.5 MG TABS tablet TAKE 1 TABLET TWICE DAILY. Patient taking differently: TAKE 2.5 MG BY MOUTH TWICE DAILY. 05/18/16   Minus Breeding, MD  ENSURE PLUS (ENSURE PLUS) LIQD TAKE ONE CAN BY MOUTH ONCE DAILY  02/13/16   Binnie Rail, MD  estradiol (ESTRACE) 0.1 MG/GM vaginal cream Place 2 g vaginally 2 (two) times a week. On Wednesdays & Saturdays.    Historical Provider, MD  furosemide (LASIX) 40 MG tablet Take 1 tablet (40 mg total) by mouth daily. 01/10/16   Biagio Borg, MD  levothyroxine (SYNTHROID, LEVOTHROID) 50 MCG tablet TAKE 1  TABLET ONCE DAILY. 06/21/16   Binnie Rail, MD  OXYGEN Inhale into the lungs continuous.    Historical Provider, MD  pantoprazole (PROTONIX) 40 MG tablet Take 1 tablet (40 mg total) by mouth daily. 86mins prior to dinner 07/03/16   Magdalen Spatz, NP  polyethylene glycol powder (GLYCOLAX/MIRALAX) powder Take 17 g by mouth daily. Mix 1 capful of powder and dissolve it in any liquid. Drink once daily. 10/30/15   Forde Dandy, MD  Probiotic Product (MISC INTESTINAL FLORA REGULAT) CAPS Take 1 capsule by mouth daily.      Historical Provider, MD  Propylene Glycol (SYSTANE BALANCE OP) Place 1 drop into both eyes as needed (dry and pain).    Historical Provider, MD  sertraline (ZOLOFT) 25 MG tablet Take 1 tablet (25 mg total) by mouth daily. In addition to 50 mg daily for a total of 75 mg daily 04/04/16   Binnie Rail, MD  sertraline (ZOLOFT) 50 MG tablet Take 1 tablet (50 mg total) by mouth daily. 01/10/16   Biagio Borg, MD  Spacer/Aero-Holding Chambers (AEROCHAMBER MV) inhaler Use as instructed 07/03/16   Magdalen Spatz, NP  timolol (TIMOPTIC) 0.5 % ophthalmic solution Place 1 drop into both eyes 2 (two) times daily.     Historical Provider, MD  trimethoprim (TRIMPEX) 100 MG tablet Take 100 mg by mouth every other day.     Historical Provider, MD  VYTORIN 10-40 MG tablet TAKE 1 TABLET ONCE DAILY. 06/11/16   Binnie Rail, MD   BP 126/75 mmHg  Pulse 80  Temp(Src) 97.8 F (36.6 C) (Oral)  Resp 22  SpO2 76% Physical Exam  Constitutional: She is oriented to person, place, and time. She appears well-developed and well-nourished.  Non-toxic appearance. No distress.  HENT:  Head:  Normocephalic and atraumatic.  No signs of cranial trauma or facial trauma noted  Eyes: Conjunctivae, EOM and lids are normal. Pupils are equal, round, and reactive to light.  Neck: Normal range of motion. Neck supple. No tracheal deviation present. No thyroid mass present.  Cardiovascular: Normal rate, regular rhythm and normal heart sounds.  Exam reveals no gallop.   No murmur heard. Pulmonary/Chest: Effort normal and breath sounds normal. No stridor. No respiratory distress. She has no decreased breath sounds. She has no wheezes. She has no rhonchi. She has no rales. She exhibits bony tenderness. She exhibits no crepitus.    Abdominal: Soft. Normal appearance and bowel sounds are normal. She exhibits no distension. There is no tenderness. There is no rebound and no CVA tenderness.  Musculoskeletal: Normal range of motion. She exhibits no edema or tenderness.  Neurological: She is alert and oriented to person, place, and time. She has normal strength. No cranial nerve deficit or sensory deficit. GCS eye subscore is 4. GCS verbal subscore is 5. GCS motor subscore is 6.  Skin: Skin is warm and dry. No abrasion and no rash noted.  Psychiatric: She has a normal mood and affect. Her speech is normal and behavior is normal.  Nursing note and vitals reviewed.   ED Course  Procedures (including critical care time) Labs Review Labs Reviewed  URINE CULTURE  CBC WITH DIFFERENTIAL/PLATELET  COMPREHENSIVE METABOLIC PANEL  URINALYSIS, ROUTINE W REFLEX MICROSCOPIC (NOT AT [facility])  TROPONIN I  BRAIN NATRIURETIC PEPTIDE    Imaging Review No results found. I have personally reviewed and evaluated these images and lab results as part of my medical decision-making.   EKG Interpretation   Date/Time:  Saturday July 06 2016 12:58:57 EDT Ventricular Rate:  68 PR Interval:    QRS Duration: 78 QT Interval:  392 QTC Calculation: 417 R Axis:   87 Text Interpretation:  Atrial fibrillation Anterior  infarct, age  indeterminate No significant change since last tracing Confirmed by Amandeep Hogston   MD, Tacori Kvamme ([id number]) on 07/06/2016 1:09:24 PM      MDM   Final diagnoses:  SOB (shortness of breath)    Patient given Lasix 41 g IV push. She possibly has some component of COPD as well. Will give patient notes of IV Solu-Medrol. Discuss with hospitalist and they will come to admit    Lacretia Leigh, MD 07/06/16 1446

## 2016-07-07 DIAGNOSIS — I2781 Cor pulmonale (chronic): Secondary | ICD-10-CM | POA: Diagnosis present

## 2016-07-07 DIAGNOSIS — E785 Hyperlipidemia, unspecified: Secondary | ICD-10-CM | POA: Diagnosis present

## 2016-07-07 DIAGNOSIS — H540 Blindness, both eyes: Secondary | ICD-10-CM | POA: Diagnosis present

## 2016-07-07 DIAGNOSIS — H409 Unspecified glaucoma: Secondary | ICD-10-CM | POA: Diagnosis present

## 2016-07-07 DIAGNOSIS — W010XXA Fall on same level from slipping, tripping and stumbling without subsequent striking against object, initial encounter: Secondary | ICD-10-CM | POA: Diagnosis present

## 2016-07-07 DIAGNOSIS — Z7189 Other specified counseling: Secondary | ICD-10-CM | POA: Diagnosis not present

## 2016-07-07 DIAGNOSIS — Z9981 Dependence on supplemental oxygen: Secondary | ICD-10-CM | POA: Diagnosis not present

## 2016-07-07 DIAGNOSIS — I5031 Acute diastolic (congestive) heart failure: Secondary | ICD-10-CM | POA: Diagnosis not present

## 2016-07-07 DIAGNOSIS — M810 Age-related osteoporosis without current pathological fracture: Secondary | ICD-10-CM | POA: Diagnosis present

## 2016-07-07 DIAGNOSIS — J9620 Acute and chronic respiratory failure, unspecified whether with hypoxia or hypercapnia: Secondary | ICD-10-CM | POA: Diagnosis not present

## 2016-07-07 DIAGNOSIS — J449 Chronic obstructive pulmonary disease, unspecified: Secondary | ICD-10-CM | POA: Diagnosis not present

## 2016-07-07 DIAGNOSIS — F329 Major depressive disorder, single episode, unspecified: Secondary | ICD-10-CM | POA: Diagnosis present

## 2016-07-07 DIAGNOSIS — R0902 Hypoxemia: Secondary | ICD-10-CM | POA: Diagnosis not present

## 2016-07-07 DIAGNOSIS — Z8701 Personal history of pneumonia (recurrent): Secondary | ICD-10-CM | POA: Diagnosis not present

## 2016-07-07 DIAGNOSIS — R0781 Pleurodynia: Secondary | ICD-10-CM | POA: Diagnosis present

## 2016-07-07 DIAGNOSIS — R627 Adult failure to thrive: Secondary | ICD-10-CM | POA: Diagnosis present

## 2016-07-07 DIAGNOSIS — J9621 Acute and chronic respiratory failure with hypoxia: Secondary | ICD-10-CM | POA: Diagnosis not present

## 2016-07-07 DIAGNOSIS — I69398 Other sequelae of cerebral infarction: Secondary | ICD-10-CM | POA: Diagnosis not present

## 2016-07-07 DIAGNOSIS — I272 Other secondary pulmonary hypertension: Secondary | ICD-10-CM | POA: Diagnosis not present

## 2016-07-07 DIAGNOSIS — I482 Chronic atrial fibrillation: Secondary | ICD-10-CM | POA: Diagnosis not present

## 2016-07-07 DIAGNOSIS — K5909 Other constipation: Secondary | ICD-10-CM | POA: Diagnosis present

## 2016-07-07 DIAGNOSIS — Z9181 History of falling: Secondary | ICD-10-CM | POA: Diagnosis not present

## 2016-07-07 DIAGNOSIS — Z66 Do not resuscitate: Secondary | ICD-10-CM | POA: Diagnosis present

## 2016-07-07 DIAGNOSIS — I5033 Acute on chronic diastolic (congestive) heart failure: Secondary | ICD-10-CM | POA: Diagnosis present

## 2016-07-07 DIAGNOSIS — N189 Chronic kidney disease, unspecified: Secondary | ICD-10-CM | POA: Diagnosis present

## 2016-07-07 DIAGNOSIS — H5442 Blindness, left eye, normal vision right eye: Secondary | ICD-10-CM | POA: Diagnosis present

## 2016-07-07 DIAGNOSIS — J90 Pleural effusion, not elsewhere classified: Secondary | ICD-10-CM | POA: Diagnosis present

## 2016-07-07 DIAGNOSIS — I350 Nonrheumatic aortic (valve) stenosis: Secondary | ICD-10-CM | POA: Diagnosis present

## 2016-07-07 DIAGNOSIS — Z515 Encounter for palliative care: Secondary | ICD-10-CM | POA: Diagnosis not present

## 2016-07-07 DIAGNOSIS — I130 Hypertensive heart and chronic kidney disease with heart failure and stage 1 through stage 4 chronic kidney disease, or unspecified chronic kidney disease: Secondary | ICD-10-CM | POA: Diagnosis present

## 2016-07-07 DIAGNOSIS — E039 Hypothyroidism, unspecified: Secondary | ICD-10-CM | POA: Diagnosis present

## 2016-07-07 LAB — BASIC METABOLIC PANEL
Anion gap: 10 (ref 5–15)
BUN: 19 mg/dL (ref 6–20)
CO2: 32 mmol/L (ref 22–32)
CREATININE: 1.01 mg/dL — AB (ref 0.44–1.00)
Calcium: 8.6 mg/dL — ABNORMAL LOW (ref 8.9–10.3)
Chloride: 98 mmol/L — ABNORMAL LOW (ref 101–111)
GFR calc Af Amer: 56 mL/min — ABNORMAL LOW (ref >60)
GFR, EST NON AFRICAN AMERICAN: 49 mL/min — AB (ref >60)
GLUCOSE: 99 mg/dL (ref 65–99)
Potassium: 3.6 mmol/L (ref 3.5–5.1)
SODIUM: 140 mmol/L (ref 135–145)

## 2016-07-07 LAB — MAGNESIUM: MAGNESIUM: 1.8 mg/dL (ref 1.7–2.4)

## 2016-07-07 LAB — BRAIN NATRIURETIC PEPTIDE: B Natriuretic Peptide: 713.4 pg/mL — ABNORMAL HIGH (ref 0.0–100.0)

## 2016-07-07 MED ORDER — TRAMADOL HCL 50 MG PO TABS
50.0000 mg | ORAL_TABLET | Freq: Once | ORAL | Status: AC
  Administered 2016-07-07: 50 mg via ORAL
  Filled 2016-07-07: qty 1

## 2016-07-07 MED ORDER — CETYLPYRIDINIUM CHLORIDE 0.05 % MT LIQD
7.0000 mL | Freq: Two times a day (BID) | OROMUCOSAL | Status: DC
  Administered 2016-07-07 – 2016-07-11 (×8): 7 mL via OROMUCOSAL

## 2016-07-07 MED ORDER — CHLORHEXIDINE GLUCONATE 0.12 % MT SOLN
15.0000 mL | Freq: Four times a day (QID) | OROMUCOSAL | Status: DC
  Administered 2016-07-07 – 2016-07-11 (×15): 15 mL via OROMUCOSAL
  Filled 2016-07-07 (×15): qty 15

## 2016-07-07 MED ORDER — POTASSIUM CHLORIDE CRYS ER 20 MEQ PO TBCR
40.0000 meq | EXTENDED_RELEASE_TABLET | Freq: Two times a day (BID) | ORAL | Status: AC
  Administered 2016-07-07 (×2): 40 meq via ORAL
  Filled 2016-07-07 (×2): qty 2

## 2016-07-07 NOTE — Evaluation (Signed)
Clinical/Bedside Swallow Evaluation Patient Details  Name: Kristine Simmons MRN: [id number] Date of Birth: 05-23-1927  Today's Date: 07/07/2016 Time: SLP Start Time (ACUTE ONLY): [id number] SLP Stop Time (ACUTE ONLY): 1501 SLP Time Calculation (min) (ACUTE ONLY): 24 min  Past Medical History:  Past Medical History  Diagnosis Date  . Hx of colonic polyps     last colonoscopy 2005, repeat was due 2010 Dr. Sharlett Iles  . Atrial fibrillation ([place])     Dr Percival Spanish  . Fracture 1995, 2009    RUE; LUE Dr. Durward Fortes  . 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 Cystitis   . Complication of anesthesia     difficulty with arousal post op  . Hyperlipidemia   . Osteoporosis   . Cerebrovascular accident ([place]) 01/09  . Hypothyroidism   . Spinal stenosis of lumbar region 01/10/2016  . Acute diastolic heart failure ([place]) 11/03/2015  . Aortic stenosis, mild 08/15/2014    Echo June, 2015, mild aortic stenosis   . Cardiorenal syndrome 11/05/2015  . Chronic anticoagulation 08/15/2014    Eliquis for A fib   . COPD, Gold grade A 04/08/2008    January 2016 pulmonary function testing> ratio 58% FEV1 1.83L (88% pred, no change with BD), TLC 5.78L (103% pred), DLCO 9.01 (31% pred) 07/2015 ONO RA > spO2 79% average 07/2015 ONO 2LPM> < 88% 189 min 08/2015 ONO 3LPM < 88% 69.8 min 09/2015 ONO 4LPM > OK   . CAROTID STENOSIS 08/08/2009    Qualifier: Diagnosis of  By: Percival Spanish, MD, Farrel Gordon    . Depression 09/22/2014  . G E R D 06/02/2007    Qualifier: Diagnosis of  By: Linna Darner MD, William    . HYPERTENSION, ESSENTIAL NOS 12/01/2007    Qualifier: Diagnosis of  By: Linna Darner MD, Gwyndolyn Saxon    . T12 compression fracture ([place]) 11/02/2015  . Squamous cell carcinoma of neck ([place]) 03/09/2014    3/15 Dr Wilhemina Bonito    Past Surgical History:  Past Surgical History  Procedure Laterality Date  . Tonsillectomy and adenoidectomy    . Vocal cord polyps    . Thyroid needle biopsy  2002  . Appendectomy    . Cataract  extraction      Bilat  . Colonoscopy w/ polypectomy  2005    Diverticulosis;Dr. Sharlett Iles  . Total hip arthroplasty  2008    CHF & RAF post op   . Total shoulder replacement  2010    Dr Durward Fortes - left shoulder  . Mohs surgery  2013    nasal Basal Cell; Dr Sarajane Jews  . Excision metacarpal mass Right 08/17/2013    Procedure: RIGHT LONG EXCISION MASS AND DIP JOINT DEBRIDEMENT;  Surgeon: Tennis Must, MD;  Location: [facility];  Service: Orthopedics;  Laterality: Right;  . Femoral hernia repair Right 08/17/2014    Procedure: OPEN REPAIR RIGHT FEMORAL HERNIA  WITH INSERTION OF MESH;  Surgeon: Adin Hector, MD;  [facility];  Service: General;  Laterality: Right;  . Insertion of mesh  08/17/2014    Procedure: INSERTION OF MESH;  Surgeon: Adin Hector, MD;  [facility];  Service: General;;  . Inguinal hernia repair Right 11/15/2014    Procedure: LAPAROSCOPIC RIGHT INGUINAL HERNIA WITH MESH;  Surgeon: Alphonsa Overall, MD;  [facility];  Service: General;  Laterality: Right;   HPI:  Pt is an 82 y.o. female with aH/o oxygen dependent copd (4-6 liter at baseline), chronic  afib on anticoagulation with apixaban, h/o cva with left eye blindness, h/o diastolic chf, h/o aspiration pneumonia who brought to the ED due to fall with left rib pain, she denies palpitation, no chest pain, no syncope.    Assessment / Plan / Recommendation Clinical Impression  Pt with hx of mild oropharyngeal dysphagia (MBS 2016)  and esophageal dysmotility. Pt without signs or symptoms of aspiration with any PO this date. Pts reduced respiratory status and cognitive deficits put her at increased risk for aspiration. Kristine Simmons present during BSE for review of safe swallow precautions which she states her mother has "difficutly remembering". Recommend continue regular thin liquid diet with medicines whole in puree as pt reports difficulty with larger PO meds. Recommend full supervision with all PO to  aid in implementation of swallow strategies. No further ST needs identified.     Aspiration Risk  Other (comment) (Mild to moderate aspiration risk )    Diet Recommendation  Regular, thin liquids    Medication Administration: Whole meds with puree    Other  Recommendations Oral Care Recommendations: Oral care BID   Follow up Recommendations  24 hour supervision/assistance    Frequency and Duration min 1 x/week  1 week       Prognosis        Swallow Study   General Date of Onset: 07/07/16 HPI: Pt is an 82 y.o. female with aH/o oxygen dependent copd (4-6 liter at baseline), chronic afib on anticoagulation with apixaban, h/o cva with left eye blindness, h/o diastolic chf, h/o aspiration pneumonia who brought to the ED due to fall with left rib pain, she denies palpitation, no chest pain, no syncope.  Type of Study: Bedside Swallow Evaluation Previous Swallow Assessment: MBS: 2016: reg/thin with esophageal precautions Diet Prior to this Study: Regular;Thin liquids Temperature Spikes Noted: No Respiratory Status: Nasal cannula History of Recent Intubation: No Behavior/Cognition: Alert;Cooperative Oral Cavity Assessment: Within Functional Limits Oral Cavity - Dentition: Adequate natural dentition Vision: Functional for self-feeding Self-Feeding Abilities: Able to feed self Patient Positioning: Upright in bed Baseline Vocal Quality: Low vocal intensity Volitional Cough: Weak Volitional Swallow: Able to elicit    Oral/Motor/Sensory Function Overall Oral Motor/Sensory Function: Within functional limits   Ice Chips Ice chips: Within functional limits   Thin Liquid Thin Liquid: Within functional limits    Nectar Thick Nectar Thick Liquid: Not tested   Honey Thick Honey Thick Liquid: Not tested   Puree Puree: Within functional limits   Solid   GO   Solid: Within functional limits    Functional Assessment Tool Used: skilled observation Functional Limitations:  Swallowing Swallow Current Status [id number]): At least 20 percent but less than 40 percent impaired, limited or restricted Swallow Goal Status [phone]): At least 20 percent but less than 40 percent impaired, limited or restricted Swallow Discharge Status [phone]): At least 20 percent but less than 40 percent impaired, limited or restricted     Arvil Chaco MA, [place]    Levi Aland 07/07/2016,3:08 PM

## 2016-07-07 NOTE — Care Management Obs Status (Signed)
[facility] NOTIFICATION   Patient Details  Name: ELIZEBETH RUSHMORE MRN: [id number] Date of Birth: 04-Apr-1927   Medicare Observation Status Notification Given:  Yes    Apolonio Schneiders, RN 07/07/2016, 3:57 PM

## 2016-07-07 NOTE — Progress Notes (Signed)
PROGRESS NOTE    Kristine Simmons  [id number] DOB: January 09, 1927 DOA: 07/06/2016  PCP: Binnie Rail, MD   Brief Narrative:  Kristine Simmons is a 82 y.o. female who has oxygen dependent copd (4-6 liter at baseline), chronic afib on anticoagulation with apixaban, cva with left eye blindness,  diastolic chf, h/o aspiration pneumonia who brought to the ED due to fall with left rib pain.  ED course: cxr did not showed rib fracture,but she is found to be hypoxic, 02 sats 76 on 4liter oxygen. ua unremarkable, labs with elevated bnp at 827, otherwise unremarkable at baseline. he was given lasix, hospitalist called to admit the patient.   Patient admitted to ongoing cough after she completed treatment for pneumonia. CT scan shows not pneumonia but pulmonary edema with pleural effusions. She admits to having a very dry mouth and constantly drinking water.   Subjective: No dyspnea at rest. Mild cough. Left rib pain.   Assessment & Plan:   Principal Problem:   Acute diastolic heart failureAcute on chronic respiratory failure with hypoxia (HCC)/  - cont to diurese with IV Lasix- I and O and daily weights - advised not to drink more than 1 L of water a day  Active Problems:   Essential hypertension - cont home meds    Atrial fibrillation  - cont Apixaban, Dig and Cardizem    COPD, Gold grade A   Moderate to severe pulmonary hypertension ([place]) - on chronic O2, Dulera, Nebs PRN  DVT prophylaxis: Apixaban Code Status: do not intubate Family Communication: husband Disposition Plan: home in 2-3 days Consultants:   none Procedures:   none Antimicrobials:  Anti-infectives    Start     Dose/Rate Route Frequency Ordered Stop   07/08/16 1000  trimethoprim (TRIMPEX) tablet 100 mg     100 mg Oral Every 48 hours 07/06/16 1611       Objective: Filed Vitals:   07/06/16 2125 07/07/16 0552 07/07/16 0845 07/07/16 1100  BP:  115/57  120/64  Pulse:  65 78 60  Temp: 97.9 F (36.6 C) 97.6 F  (36.4 C)    TempSrc: Oral Oral    Resp:  14 16   Height:      Weight:      SpO2: 77% 82% 83%     Intake/Output Summary (Last 24 hours) at 07/07/16 1515 Last data filed at 07/07/16 1300  Gross per 24 hour  Intake 682.17 ml  Output    550 ml  Net 132.17 ml   Filed Weights   07/06/16 1640  Weight: 54.159 kg (119 lb 6.4 oz)    Examination: General exam: Appears comfortable  HEENT: PERRLA, oral mucosa moist, no sclera icterus or thrush Respiratory system: Clear to auscultation. Decreased breath sounds at bases Cardiovascular system: S1 & S2 heard, RRR.  No murmurs  Gastrointestinal system: Abdomen soft, non-tender, nondistended. Normal bowel sound. No organomegaly Central nervous system: Alert and oriented. No focal neurological deficits. Extremities: No cyanosis, clubbing or edema Skin: No rashes or ulcers Psychiatry:  Mood & affect appropriate.     Data Reviewed: I have personally reviewed following labs and imaging studies  CBC:  Recent Labs Lab 07/06/16 1315  WBC 7.8  NEUTROABS 5.4  HGB 14.3  HCT 44.3  MCV 84.2  PLT [id number]   Basic Metabolic Panel:  Recent Labs Lab 07/06/16 1315 07/07/16 0446  NA 139 140  K 3.6 3.6  CL 99* 98*  CO2 30 32  GLUCOSE 115* 99  BUN 21* 19  CREATININE 1.11* 1.01*  CALCIUM 9.1 8.6*  MG  --  1.8   GFR: Estimated Creatinine Clearance: 33.6 mL/min (by C-G formula based on Cr of 1.01). Liver Function Tests:  Recent Labs Lab 07/06/16 1315  AST 41  ALT 23  ALKPHOS 47  BILITOT 1.2  PROT 6.9  ALBUMIN 4.0   No results for input(s): LIPASE, AMYLASE in the last 168 hours. No results for input(s): AMMONIA in the last 168 hours. Coagulation Profile: No results for input(s): INR, PROTIME in the last 168 hours. Cardiac Enzymes:  Recent Labs Lab 07/06/16 1315  TROPONINI 0.04*   BNP (last 3 results) No results for input(s): PROBNP in the last 8760 hours. HbA1C: No results for input(s): HGBA1C in the last 72  hours. CBG: No results for input(s): GLUCAP in the last 168 hours. Lipid Profile: No results for input(s): CHOL, HDL, LDLCALC, TRIG, CHOLHDL, LDLDIRECT in the last 72 hours. Thyroid Function Tests: No results for input(s): TSH, T4TOTAL, FREET4, T3FREE, THYROIDAB in the last 72 hours. Anemia Panel: No results for input(s): VITAMINB12, FOLATE, FERRITIN, TIBC, IRON, RETICCTPCT in the last 72 hours. Urine analysis:    Component Value Date/Time   COLORURINE YELLOW 07/06/2016 [place] 07/06/2016 1237   LABSPEC 1.008 07/06/2016 1237   PHURINE 7.0 07/06/2016 1237   GLUCOSEU NEGATIVE 07/06/2016 1237   GLUCOSEU NEGATIVE 05/26/2015 1507   HGBUR NEGATIVE 07/06/2016 1237   HGBUR moderate 10/05/2010 0825   BILIRUBINUR NEGATIVE 07/06/2016 1237   KETONESUR NEGATIVE 07/06/2016 1237   PROTEINUR NEGATIVE 07/06/2016 1237   UROBILINOGEN 1.0 11/03/2015 0657   NITRITE NEGATIVE 07/06/2016 1237   LEUKOCYTESUR NEGATIVE 07/06/2016 1237   Sepsis Labs: @LABRCNTIP (procalcitonin:4,lacticidven:4) )No results found for this or any previous visit (from the past 240 hour(s)).       Radiology Studies: Dg Ribs Unilateral W/chest Left  07/06/2016  CLINICAL DATA:  Fall with left rib cage pain. EXAM: LEFT RIBS AND CHEST - 3+ VIEW COMPARISON:  07/03/2016 FINDINGS: Hyperexpansion is consistent with emphysema. Interstitial markings are diffusely coarsened with chronic features. Bibasilar atelectasis or infiltrate with small bilateral pleural effusions, new in the interval. Radio-opaque marker has been placed on the skin at the site of patient concern. Oblique views of the left ribs show no evidence for an acute displaced left-sided rib fracture. Patient is status post left shoulder replacement. IMPRESSION: Emphysema with bibasilar atelectasis or infiltrate and small bilateral pleural effusions. No evidence for displaced acute left-sided rib fracture. Electronically Signed   By: Misty Stanley M.D.   On:  07/06/2016 14:07   Ct Chest Wo Contrast  07/06/2016  CLINICAL DATA:  82 year old female with a history of fall and rib pain EXAM: CT CHEST WITHOUT CONTRAST TECHNIQUE: Multidetector CT imaging of the chest was performed following the standard protocol without IV contrast. COMPARISON:  Chest x-ray 07/06/2016, prior CT 04/08/2015, thoracic plain film 11/23/2015 FINDINGS: Chest: Chest Wall: Superficial soft tissues unremarkable. No axillary or supraclavicular adenopathy. Thoracic Inlet: Unremarkable appearance of the thoracic inlet. Unremarkable thyroid. Airways:  No endotracheal debris. No bronchial wall thickening. Mediastinum: Borderline enlarged lymph nodes of the mediastinum, similar configuration to the comparison CT study, largest in the lowest peritracheal measures 13 mm, unchanged from prior. Calcified left hilar lymph nodes. No hiatal hernia. Heart: Borderline enlarged heart. Calcifications of left main, left anterior descending, circumflex, right coronary artery. Calcifications of the aortic valve. No pericardial fluid/thickening. Vasculature: Vasculature not well evaluated given the absence of intravenous contrast. No periaortic  fluid. No aneurysm. Dense calcifications of the ascending aorta, transverse arch, descending thoracic aorta. No periaortic fluid. Calcifications of the branch vessels. Lungs: Advanced paraseptal and centrilobular emphysema. Small moderate bilateral pleural effusions. Interlobular septal thickening with thickening of the bilateral a fissures. Trace atelectasis bilaterally. Sub solid nodule measuring 9 mm of the right upper lobe, new from the comparison CT of 1 year prior No endotracheal debris. Upper abdomen: Calcifications of the spleen and liver parenchyma. Musculoskeletal: No displaced fracture. Surgical changes of the left shoulder incompletely imaged. T12 compression fracture present on prior imaging, similar in configuration. Multilevel degenerative changes of the thoracic  spine. IMPRESSION: No displaced fracture identified. Advanced paraseptal and centrilobular emphysema with evidence of superimposed edema. Associated small to moderate pleural effusions. Sub solid nodule of the right upper lobe measures 9 mm. Guidelines for follow-up include initial follow-up noncontrast CT in 3 months. Aortic atherosclerosis with associated left main and 3 vessel coronary artery disease. Signed, Dulcy Fanny. Earleen Newport, DO [facility] Radiology Electronically Signed   By: Corrie Mckusick D.O.   On: 07/06/2016 16:32      Scheduled Meds: . acidophilus  1 capsule Oral Daily  . antiseptic oral rinse  7 mL Mouth Rinse BID  . apixaban  2.5 mg Oral BID  . apraclonidine  1 drop Right Eye Q12H  . calcium-vitamin D  2 tablet Oral Q breakfast  . chlorhexidine  15 mL Mouth/Throat QID  . digoxin  125 mcg Oral Daily  . diltiazem  120 mg Oral Daily  . estradiol  2 g Vaginal Once per day on Wed Sat  . ezetimibe-simvastatin  1 tablet Oral Daily  . furosemide  40 mg Intravenous BID  . levothyroxine  50 mcg Oral QAC breakfast  . mometasone-formoterol  2 puff Inhalation BID  . pantoprazole  40 mg Oral Daily  . polyethylene glycol  17 g Oral Daily  . potassium chloride  40 mEq Oral BID  . senna-docusate  2 tablet Oral BID  . sertraline  75 mg Oral Daily  . sodium chloride flush  3 mL Intravenous Q12H  . timolol  1 drop Both Eyes BID  . [START ON 07/08/2016] trimethoprim  100 mg Oral Q48H   Continuous Infusions: . sodium chloride 10 mL/hr (07/06/16 1739)     LOS: 1 day    Time spent in minutes: 36     [place], MD [facility] Pager: [email or web address] Password [email or web address] 07/07/2016, 3:15 PM

## 2016-07-08 LAB — CBC
HEMATOCRIT: 41.8 % (ref 36.0–46.0)
HEMOGLOBIN: 13.3 g/dL (ref 12.0–15.0)
MCH: 26.9 pg (ref 26.0–34.0)
MCHC: 31.8 g/dL (ref 30.0–36.0)
MCV: 84.6 fL (ref 78.0–100.0)
Platelets: 177 10*3/uL (ref 150–400)
RBC: 4.94 MIL/uL (ref 3.87–5.11)
RDW: 18.3 % — AB (ref 11.5–15.5)
WBC: 7.9 10*3/uL (ref 4.0–10.5)

## 2016-07-08 LAB — URINE CULTURE: Culture: NO GROWTH

## 2016-07-08 LAB — BASIC METABOLIC PANEL
ANION GAP: 5 (ref 5–15)
BUN: 19 mg/dL (ref 6–20)
CO2: 35 mmol/L — AB (ref 22–32)
Calcium: 9 mg/dL (ref 8.9–10.3)
Chloride: 101 mmol/L (ref 101–111)
Creatinine, Ser: 1.17 mg/dL — ABNORMAL HIGH (ref 0.44–1.00)
GFR calc Af Amer: 47 mL/min — ABNORMAL LOW (ref >60)
GFR, EST NON AFRICAN AMERICAN: 41 mL/min — AB (ref >60)
GLUCOSE: 116 mg/dL — AB (ref 65–99)
POTASSIUM: 5.1 mmol/L (ref 3.5–5.1)
Sodium: 141 mmol/L (ref 135–145)

## 2016-07-08 MED ORDER — DIPHENHYDRAMINE HCL 25 MG PO CAPS
25.0000 mg | ORAL_CAPSULE | Freq: Once | ORAL | Status: AC
  Administered 2016-07-08: 25 mg via ORAL
  Filled 2016-07-08 (×2): qty 1

## 2016-07-08 NOTE — Progress Notes (Signed)
Patient ambulatory around entire unit, O2 sats range from 87-92 percent on 6 L. Upon entering room patient dsat to 72 on 6L sat patient down, O2 sat back up to 90 percent on 6L. Patient complained of mild SOB at end of walk.

## 2016-07-08 NOTE — Progress Notes (Signed)
Reviewed, agree 

## 2016-07-08 NOTE — Consult Note (Signed)
Patient ID: Kristine Simmons MRN: [id number], DOB/AGE: [birth year]   Admit date: 07/06/2016   Reason for Consult: CHF Requesting MD: Dr. Wynelle Cleveland, Internal Medicine   Primary Physician: Binnie Rail, MD Primary Cardiologist: Dr. Percival Spanish  Pt. 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   Problem List  Past Medical History  Diagnosis Date  . Hx of colonic polyps     last colonoscopy 2005, repeat was due 2010 Dr. Sharlett Iles  . Atrial fibrillation ([place])     Dr Percival Spanish  . Fracture 1995, 2009    RUE; LUE Dr. Durward Fortes  . 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 Cystitis   . Complication of anesthesia     difficulty with arousal post op  . Hyperlipidemia   . Osteoporosis   . Cerebrovascular accident ([place]) 01/09  . Hypothyroidism   . Spinal stenosis of lumbar region 01/10/2016  . Acute diastolic heart failure ([place]) 11/03/2015  . Aortic stenosis, mild 08/15/2014    Echo June, 2015, mild aortic stenosis   . Cardiorenal syndrome 11/05/2015  . Chronic anticoagulation 08/15/2014    Eliquis for A fib   . COPD, Gold grade A 04/08/2008    January 2016 pulmonary function testing> ratio 58% FEV1 1.83L (88% pred, no change with BD), TLC 5.78L (103% pred), DLCO 9.01 (31% pred) 07/2015 ONO RA > spO2 79% average 07/2015 ONO 2LPM> < 88% 189 min 08/2015 ONO 3LPM < 88% 69.8 min 09/2015 ONO 4LPM > OK   . CAROTID STENOSIS 08/08/2009    Qualifier: Diagnosis of  By: Percival Spanish, MD, Farrel Gordon    . Depression 09/22/2014  . G E R D 06/02/2007    Qualifier: Diagnosis of  By: Linna Darner MD, William    . HYPERTENSION, ESSENTIAL NOS 12/01/2007    Qualifier: Diagnosis of  By:  Linna Darner MD, Gwyndolyn Saxon    . T12 compression fracture ([place]) 11/02/2015  . Squamous cell carcinoma of neck ([place]) 03/09/2014    3/15 Dr Wilhemina Bonito     Past Surgical History  Procedure Laterality Date  . Tonsillectomy and adenoidectomy    . Vocal cord polyps    . Thyroid needle biopsy  2002  . Appendectomy    . Cataract extraction      Bilat  . Colonoscopy w/ polypectomy  2005    Diverticulosis;Dr. Sharlett Iles  . Total hip arthroplasty  2008    CHF & RAF post op   . Total shoulder replacement  2010    Dr Durward Fortes - left shoulder  . Mohs surgery  2013    nasal Basal Cell; Dr Sarajane Jews  . Excision metacarpal mass Right 08/17/2013    Procedure: RIGHT LONG EXCISION MASS AND DIP JOINT DEBRIDEMENT;  Surgeon: Tennis Must, MD;  Location: [facility];  Service: Orthopedics;  Laterality: Right;  . Femoral hernia repair Right 08/17/2014    Procedure: OPEN REPAIR RIGHT FEMORAL HERNIA  WITH INSERTION OF MESH;  Surgeon: Adin Hector, MD;  Location: [facility];  Service: General;  Laterality: Right;  . Insertion of mesh  08/17/2014    Procedure: INSERTION OF MESH;  Surgeon: Adin Hector, MD;  Location: [facility];  Service: General;;  . Inguinal hernia repair Right 11/15/2014    Procedure: LAPAROSCOPIC RIGHT INGUINAL HERNIA WITH MESH;  Surgeon: Alphonsa Overall, MD;  Location: [facility];  Service: General;  Laterality: Right;     Allergies  Allergies  Allergen Reactions  . Hydrocodone Nausea And Vomiting  . Desmopressin Acetate     REACTION: swelling feet  . Dorzolamide Hcl Other (See Comments)    Burning in eyes and redness  . Prempro [Conj Estrog-Medroxyprogest Ace]     unknown  . 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  She is followed by Dr. Percival Spanish.  This patients CHA2DS2-VASc Score and unadjusted Ischemic Stroke Rate (% per year) is equal to 11.2 % stroke rate/year from a score of 7 (CHF, Age >75 (2), prior CVA (2), HTN + female).  Above score calculated as 1 point each if present [CHF, HTN, DM, Vascular=MI/PAD/Aortic Plaque, Age if 65-74, or Female] Above score calculated as 2 points each if present [Age > 75, or Stroke/TIA/TE].  As outlined above, she was recently admitted at the end of June for CAP. She was treated for several days in the hospital with antibiotics then discharged home. She was continued on PO antibiotics as an outpatient, however she continued to notice hypoxia with drops in O2 sats and had to increase her supplemental O2 to 5-6 L.   She  presented back on 07/06/16 with a CC of left rib pain. This occurred after falling off of her bed at home. It was not a hard fall but she did slightly bump her head. No significant head trauma. No AMS.  CXR did not show any rib fractures however she was noted to be hypoxic with O2 sats at 76 despite being of 4L [place], which is her baseline. BNP was abnormal at 827. CXR showed small bilateral pleural effusions.  Chest CT also confirmed small to moderate pleural effusions and advanced paraseptal and centrilobular emphysema with evidence of superimposed edema. There was a sub solid nodule of the right upper lobe measuring 9 mm (3 month f/u CT recommended). She was also noted to have aortic atherosclerosis with associated left main and 3 vessel coronary artery disease. She was given IV lasix in the ED and was admitted by IM. Cardiology has been consulted for recommendations.   She has been treated with IV lasix, 40 mg IV BID. ? If I/Os are accurate. Documented UOP  for last 24 hrs only 550 cc. I/Os net negative 661 cc since admit. She does note recent symptoms of  cystitis however UA 7/15 was negative. She has mild renal insufficiency with SCr of 1.17. BUN is normal at 19. K is stable at 5.1. BP is soft but stable in the low [id number] systolic. Troponin is minimally elevated at 0.04. EKG shows atrial fibrillation w/ a CVR in the 60s. She remains on 6L [place].   Home Medications  Prior to Admission medications   Medication Sig Start Date End Date Taking? Authorizing Provider  albuterol (VENTOLIN HFA) 108 (90 BASE) MCG/ACT inhaler Inhale 2 puffs into the lungs every 4 (four) hours as needed  for wheezing or shortness of breath.    Yes Historical Provider, MD  amoxicillin-clavulanate (AUGMENTIN) 875-125 MG tablet Take 1 tablet by mouth 2 (two) times daily. 07/04/16 07/11/16 Yes Magdalen Spatz, NP  apraclonidine (IOPIDINE) 0.5 % ophthalmic solution Place 1 drop into the right eye every 12 (twelve) hours.   Yes Historical Provider, MD  budesonide-formoterol (SYMBICORT) 80-4.5 MCG/ACT inhaler Inhale 2 puffs into the lungs 2 (two) times daily. 02/02/16  Yes Juanito Doom, MD  calcium-vitamin D (OSCAL WITH D) 500-200 MG-UNIT tablet Take 2 tablets by mouth daily with breakfast.   Yes Historical Provider, MD  Melvin 125 MCG tablet TAKE 1 TABLET ONCE DAILY. 05/26/15  Yes Minus Breeding, MD  diltiazem (CARDIZEM CD) 120 MG 24 hr capsule TAKE (1) CAPSULE DAILY. 05/27/16  Yes Minus Breeding, MD  ELIQUIS 2.5 MG TABS tablet TAKE 1 TABLET TWICE DAILY. 05/18/16  Yes Minus Breeding, MD  ENSURE PLUS (ENSURE PLUS) LIQD TAKE ONE CAN BY MOUTH ONCE DAILY 02/13/16  Yes Binnie Rail, MD  estradiol (ESTRACE) 0.1 MG/GM vaginal cream Place 2 g vaginally 2 (two) times a week. On Wednesdays & Saturdays.   Yes Historical Provider, MD  furosemide (LASIX) 40 MG tablet Take 1 tablet (40 mg total) by mouth daily. 01/10/16  Yes Biagio Borg, MD  levothyroxine (SYNTHROID, LEVOTHROID) 50 MCG tablet TAKE 1 TABLET ONCE DAILY. 06/21/16  Yes Binnie Rail, MD  OXYGEN Inhale into the lungs continuous. Sitting or resting  flow should be on 4 L  And during activities use 5-6 L   Yes Historical Provider, MD  pantoprazole (PROTONIX) 40 MG tablet Take 1 tablet (40 mg total) by mouth daily. 32mins prior to dinner 07/03/16  Yes Magdalen Spatz, NP  polyethylene glycol powder (GLYCOLAX/MIRALAX) powder Take 17 g by mouth daily. Mix 1 capful of powder and dissolve it in any liquid. Drink once daily. 10/30/15  Yes Forde Dandy, MD  Probiotic Product (MISC INTESTINAL FLORA REGULAT) CAPS Take 1 capsule by mouth daily.     Yes Historical Provider, MD  sertraline (ZOLOFT) 25 MG tablet Take 1 tablet (25 mg total) by mouth daily. In addition to 50 mg daily for a total of 75 mg daily 04/04/16  Yes Binnie Rail, MD  sertraline (ZOLOFT) 50 MG tablet Take 1 tablet (50 mg total) by mouth daily. 01/10/16  Yes Biagio Borg, MD  Spacer/Aero-Holding Chambers (AEROCHAMBER MV) inhaler Use as instructed 07/03/16  Yes Magdalen Spatz, NP  timolol (TIMOPTIC) 0.5 % ophthalmic solution Place 1 drop into both eyes 2 (two) times daily.    Yes Historical Provider, MD  trimethoprim (TRIMPEX) 100 MG tablet Take 100 mg by mouth every other day.    Yes Historical Provider, MD  VYTORIN 10-40 MG tablet TAKE 1 TABLET ONCE DAILY. 06/11/16  Yes Binnie Rail, MD    Family History  Family History  Problem Relation Age of Onset  . Stroke Father     > 89  . Diabetes Father   . Leukemia Mother   . Cancer Mother     leukemia  . Colon cancer Brother     Valve replacement  . Heart attack Brother   . Breast cancer Sister   . Ovarian cancer Sister   . Lung cancer Sister     smoker  . Prostate cancer Brother   . Cancer Brother     bladder    Social History  Social History   Social History  .  Marital Status: Married    Spouse Name: N/A  . Number of Children: N/A  . Years of Education: N/A   Occupational History  . Not on file.   Social History Main Topics  . Smoking status: Former Smoker -- 1.00 packs/day for 45 years    Types: Cigarettes    Quit  date: 12/23/1993  . Smokeless tobacco: Never Used     Comment: Smoked 1950-1995, up to 1 ppd  . Alcohol Use: No  . Drug Use: No  . Sexual Activity: No   Other Topics Concern  . Not on file   Social History Narrative     Review of Systems General:  No chills, fever, night sweats or weight changes.  Cardiovascular:  No chest pain, dyspnea on exertion, edema, orthopnea, palpitations, paroxysmal nocturnal dyspnea. Dermatological: No rash, lesions/masses Respiratory: No cough, dyspnea Urologic: No hematuria, dysuria Abdominal:   No nausea, vomiting, diarrhea, bright red blood per rectum, melena, or hematemesis Neurologic:  No visual changes, wkns, changes in mental status. All other systems reviewed and are otherwise negative except as noted above.  Physical Exam  Blood pressure 102/60, pulse 68, temperature 97.7 F (36.5 C), temperature source Oral, resp. rate 20, height 5\' 7"  (1.702 m), weight 118 lb 4.8 oz (53.661 kg), SpO2 92 %.  General: Pleasant, NAD, elderly and frail, on supplemental O2 Psych: Normal affect. Neuro: Alert and oriented X 3. Moves all extremities spontaneously. HEENT: Normal  Neck: Supple without bruits or JVD. Lungs:  Resp regular and unlabored, CTA. Heart: irregularly irregular, regular rate 1/6 SM at RUSB Abdomen: Soft, non-tender, non-distended, BS + x 4.  Extremities: No clubbing, cyanosis or edema. DP/PT/Radials 2+ and equal bilaterally.  Labs  Troponin (Point of Care Test) No results for input(s): TROPIPOC in the last 72 hours.  Recent Labs  07/06/16 1315  TROPONINI 0.04*   Lab Results  Component Value Date   WBC 7.9 07/08/2016   HGB 13.3 07/08/2016   HCT 41.8 07/08/2016   MCV 84.6 07/08/2016   PLT 177 07/08/2016    Recent Labs Lab 07/06/16 1315  07/08/16 0435  NA 139  < > 141  K 3.6  < > 5.1  CL 99*  < > 101  CO2 30  < > 35*  BUN 21*  < > 19  CREATININE 1.11*  < > 1.17*  CALCIUM 9.1  < > 9.0  PROT 6.9  --   --   BILITOT 1.2   --   --   ALKPHOS 47  --   --   ALT 23  --   --   AST 41  --   --   GLUCOSE 115*  < > 116*  < > = values in this interval not displayed. Lab Results  Component Value Date   CHOL 142 01/10/2016   HDL 51.80 01/10/2016   LDLCALC 71 01/10/2016   TRIG 95.0 01/10/2016   Lab Results  Component Value Date   DDIMER 6.56* 11/03/2015     Radiology/Studies  Dg Chest 2 View  07/03/2016  CLINICAL DATA:  Emphysema.  Aspiration pneumonia EXAM: CHEST  2 VIEW COMPARISON:  06/10/2016 FINDINGS: Severe COPD with pulmonary hyperinflation and diffuse pulmonary scarring arm. Patchy bibasilar airspace disease similar to the prior study and may represent pneumonia. Small left effusion unchanged. Negative for heart failure or mass. Severe compression fracture lower thoracic spine is unchanged. IMPRESSION: Severe COPD with scarring. Patchy bibasilar airspace disease unchanged most consistent with pneumonia. Electronically Signed  By: Franchot Gallo M.D.   On: 07/03/2016 11:48   Dg Ribs Unilateral W/chest Left  07/06/2016  CLINICAL DATA:  Fall with left rib cage pain. EXAM: LEFT RIBS AND CHEST - 3+ VIEW COMPARISON:  07/03/2016 FINDINGS: Hyperexpansion is consistent with emphysema. Interstitial markings are diffusely coarsened with chronic features. Bibasilar atelectasis or infiltrate with small bilateral pleural effusions, new in the interval. Radio-opaque marker has been placed on the skin at the site of patient concern. Oblique views of the left ribs show no evidence for an acute displaced left-sided rib fracture. Patient is status post left shoulder replacement. IMPRESSION: Emphysema with bibasilar atelectasis or infiltrate and small bilateral pleural effusions. No evidence for displaced acute left-sided rib fracture. Electronically Signed   By: Misty Stanley M.D.   On: 07/06/2016 14:07   Ct Chest Wo Contrast  07/06/2016  CLINICAL DATA:  82 year old female with a history of fall and rib pain EXAM: CT CHEST WITHOUT  CONTRAST TECHNIQUE: Multidetector CT imaging of the chest was performed following the standard protocol without IV contrast. COMPARISON:  Chest x-ray 07/06/2016, prior CT 04/08/2015, thoracic plain film 11/23/2015 FINDINGS: Chest: Chest Wall: Superficial soft tissues unremarkable. No axillary or supraclavicular adenopathy. Thoracic Inlet: Unremarkable appearance of the thoracic inlet. Unremarkable thyroid. Airways:  No endotracheal debris. No bronchial wall thickening. Mediastinum: Borderline enlarged lymph nodes of the mediastinum, similar configuration to the comparison CT study, largest in the lowest peritracheal measures 13 mm, unchanged from prior. Calcified left hilar lymph nodes. No hiatal hernia. Heart: Borderline enlarged heart. Calcifications of left main, left anterior descending, circumflex, right coronary artery. Calcifications of the aortic valve. No pericardial fluid/thickening. Vasculature: Vasculature not well evaluated given the absence of intravenous contrast. No periaortic fluid. No aneurysm. Dense calcifications of the ascending aorta, transverse arch, descending thoracic aorta. No periaortic fluid. Calcifications of the branch vessels. Lungs: Advanced paraseptal and centrilobular emphysema. Small moderate bilateral pleural effusions. Interlobular septal thickening with thickening of the bilateral a fissures. Trace atelectasis bilaterally. Sub solid nodule measuring 9 mm of the right upper lobe, new from the comparison CT of 1 year prior No endotracheal debris. Upper abdomen: Calcifications of the spleen and liver parenchyma. Musculoskeletal: No displaced fracture. Surgical changes of the left shoulder incompletely imaged. T12 compression fracture present on prior imaging, similar in configuration. Multilevel degenerative changes of the thoracic spine. IMPRESSION: No displaced fracture identified. Advanced paraseptal and centrilobular emphysema with evidence of superimposed edema. Associated  small to moderate pleural effusions. Sub solid nodule of the right upper lobe measures 9 mm. Guidelines for follow-up include initial follow-up noncontrast CT in 3 months. Aortic atherosclerosis with associated left main and 3 vessel coronary artery disease. Signed, Dulcy Fanny. Earleen Newport, DO [facility] Radiology Electronically Signed   By: Corrie Mckusick D.O.   On: 07/06/2016 16:32   Dg Chest Portable 1 View  06/10/2016  CLINICAL DATA:  82 year old female with a history of low oxygen saturation EXAM: PORTABLE CHEST 1 VIEW COMPARISON:  06/03/2016, 05/11/2016 FINDINGS: Cardiomediastinal silhouette unchanged in size and contour. Atherosclerosis of the aortic arch. Stigmata of emphysema, with flattened hemidiaphragms, and hyperinflation on the AP view. Worsening mixed interstitial and airspace opacities of the bilateral lungs, worst on the right compared to the prior. Blunting of the left costophrenic angle and obscuration left hemidiaphragm. Calcifications of the aortic arch. No pneumothorax. Surgical changes of left shoulder arthroplasty. IMPRESSION: Advanced emphysema with developing right greater than left mixed interstitial and airspace opacities. Differential includes multifocal pneumonia  with developing left parapneumonic effusion, or potentially edema and pleural effusion. Atherosclerosis. Signed, Dulcy Fanny. Earleen Newport, DO [facility] Rad[facility] Electronically Signed   By: Corrie Mckusick D.O.   On: 06/10/2016 14:11    ECG  Chronic atrial fibrillation w/ a CVR, 68 bpm    ASSESSMENT AND PLAN  Principal Problem:   Acute diastolic heart failure (HCC) Active Problems:   Essential hypertension   Atrial fibrillation (HCC)   COPD, Gold grade A   Acute on chronic respiratory failure with hypoxia (HCC)   Moderate to severe pulmonary hypertension (HCC)   Hypoxia  1. Acute on Chronic Respiratory Failure:  multifactorial, in the setting of acute on chronic HF, COPD, moderate to severe pulmonary HTN, ? Unresolved CAP. BNP was abnormal at 827. CXR and Chest CT showed small bilateral pleural effusions. She remains on 6L [place] (baseline supplemental O2 requirement is 4-5L [place]). Continue lasix for dHF. ? If I/Os are accurate. She notes she feels that UOP has slowed over the past 24 hrs. She also notes development of cystitis. UA 07/06/16 was negative, but may need to repeat. Given voiding issues, she may benefit from insertion of a foley catheter which will also help to better measure UOP. For now, continue IV lasix, 40 mg BID, strict I/Os, daily weights, low sodium diet. IM to management COPD. MD to follow with further recommendations.   2. Chronic Atrial Fibrillation: HR is well controlled with Cardizem and digoxin. This patients CHA2DS2-VASc Score and unadjusted Ischemic Stroke Rate (% per year) is equal to 11.2 % stroke rate/year from a score of 7 (CHF, Age >75 (2), prior CVA (2), HTN + female).  Above score calculated as 1 point each if present [CHF, HTN, DM, Vascular=MI/PAD/Aortic Plaque, Age if 65-74, or Female] Above score calculated as 2 points each if present [Age > 75, or Stroke/TIA/TE]. - continue Eliquis for a/c.     Signed, Lyda Jester, PA-C 07/08/2016, 11:46 AM  History and all data above reviewed.  Patient examined.  I agree with the findings as above.  Presents with continued SOB after treatment for pneumonia.  She does have some suggestion of acute on chronic diastolic HF.   The patient exam reveals [id number]  ,  Lungs: Decreased breath sounds  ,  Abd: Positive bowel sounds, no rebound no guarding, Ext No edema  .  All available labs, radiology testing, previous records reviewed. Agree with documented assessment and plan. Acute on chronic SOB:  I think that this is a mixed picture.  There is likely some element of acute on chronic diastolic volume overload.  However, I don't suspect that  she is severely hypervolemic.  At this point I would suggest continued IV diuresis as is ordered and more strict weights and Intake/Output.  We will follow.    Jeneen Rinks Javonn Gauger  2:09 PM  07/08/2016

## 2016-07-08 NOTE — Progress Notes (Signed)
PROGRESS NOTE    Kristine Simmons  [id number] DOB: 1927-04-01 DOA: 07/06/2016  PCP: Binnie Rail, MD   Brief Narrative:  Kristine Simmons is a 82 y.o. female who has oxygen dependent copd (4-6 liter at baseline), chronic afib on anticoagulation with apixaban, cva with left eye blindness,  diastolic chf, h/o aspiration pneumonia who brought to the ED due to fall with left rib pain.  ED course: cxr did not showed rib fracture,but she is found to be hypoxic, 02 sats 76 on 4liter oxygen. ua unremarkable, labs with elevated bnp at 827, otherwise unremarkable at baseline. he was given lasix, hospitalist called to admit the patient.   Patient admitted to ongoing cough after she completed treatment for pneumonia. CT scan shows not pneumonia but pulmonary edema with pleural effusions. She admits to having a very dry mouth and constantly drinking water.   Subjective: No new complaints for me. States she is urinating "alot".   Assessment & Plan:   Principal Problem:   Acute diastolic heart failure Acute on chronic respiratory failure with hypoxia  Recent aspiration pneumonia - cont to diurese with IV Lasix- I and O and daily weights - advised not to drink more than 1 L of water a day - consulted cardiology to assist with diuresis - currently requiring 6.5 L O2 to keep pulse ox at 85%  Active Problems:    COPD, Gold grade A   Moderate to severe pulmonary hypertension   Chronic respiratory failure - on chronic O2, Dulera,, Nebs PRN - Protonix recently started on 7/12 by pulmonary    Essential hypertension - cont home meds    Atrial fibrillation  - cont Apixaban, Dig and Cardizem   Hypothyroid Synthroid    DVT prophylaxis: Apixaban Code Status: do not intubate Family Communication: husband Disposition Plan: home in 2-3 days Consultants:   none Procedures:   none Antimicrobials:  Anti-infectives    Start     Dose/Rate Route Frequency Ordered Stop   07/08/16 1000   trimethoprim (TRIMPEX) tablet 100 mg     100 mg Oral Every 48 hours 07/06/16 1611       Objective: Filed Vitals:   07/08/16 0550 07/08/16 0859 07/08/16 1025 07/08/16 1100  BP: 113/63  102/60   Pulse: 57  68   Temp: 97.7 F (36.5 C)     TempSrc: Oral     Resp: 20     Height:      Weight: 53.661 kg (118 lb 4.8 oz)     SpO2: 91% 87%  92%    Intake/Output Summary (Last 24 hours) at 07/08/16 1459 Last data filed at 07/08/16 1442  Gross per 24 hour  Intake    356 ml  Output   1300 ml  Net   -944 ml   Filed Weights   07/06/16 1640 07/08/16 0550  Weight: 54.159 kg (119 lb 6.4 oz) 53.661 kg (118 lb 4.8 oz)    Examination: General exam: Appears comfortable  HEENT: PERRLA, oral mucosa moist, no sclera icterus or thrush Respiratory system: Clear to auscultation. Decreased breath sounds at bases Cardiovascular system: S1 & S2 heard, RRR.  No murmurs  Gastrointestinal system: Abdomen soft, non-tender, nondistended. Normal bowel sound. No organomegaly Central nervous system: Alert and oriented. No focal neurological deficits. Extremities: No cyanosis, clubbing or edema Skin: No rashes or ulcers Psychiatry:  Mood & affect appropriate.     Data Reviewed: I have personally reviewed following labs and imaging studies  CBC:  Recent  Labs Lab 07/06/16 1315 07/08/16 0435  WBC 7.8 7.9  NEUTROABS 5.4  --   HGB 14.3 13.3  HCT 44.3 41.8  MCV 84.2 84.6  PLT 183 [id number]   Basic Metabolic Panel:  Recent Labs Lab 07/06/16 1315 07/07/16 0446 07/08/16 0435  NA 139 140 141  K 3.6 3.6 5.1  CL 99* 98* 101  CO2 30 32 35*  GLUCOSE 115* 99 116*  BUN 21* 19 19  CREATININE 1.11* 1.01* 1.17*  CALCIUM 9.1 8.6* 9.0  MG  --  1.8  --    GFR: Estimated Creatinine Clearance: 28.7 mL/min (by C-G formula based on Cr of 1.17). Liver Function Tests:  Recent Labs Lab 07/06/16 1315  AST 41  ALT 23  ALKPHOS 47  BILITOT 1.2  PROT 6.9  ALBUMIN 4.0   No results for input(s): LIPASE,  AMYLASE in the last 168 hours. No results for input(s): AMMONIA in the last 168 hours. Coagulation Profile: No results for input(s): INR, PROTIME in the last 168 hours. Cardiac Enzymes:  Recent Labs Lab 07/06/16 1315  TROPONINI 0.04*   BNP (last 3 results) No results for input(s): PROBNP in the last 8760 hours. HbA1C: No results for input(s): HGBA1C in the last 72 hours. CBG: No results for input(s): GLUCAP in the last 168 hours. Lipid Profile: No results for input(s): CHOL, HDL, LDLCALC, TRIG, CHOLHDL, LDLDIRECT in the last 72 hours. Thyroid Function Tests: No results for input(s): TSH, T4TOTAL, FREET4, T3FREE, THYROIDAB in the last 72 hours. Anemia Panel: No results for input(s): VITAMINB12, FOLATE, FERRITIN, TIBC, IRON, RETICCTPCT in the last 72 hours. Urine analysis:    Component Value Date/Time   COLORURINE YELLOW 07/06/2016 [place] 07/06/2016 1237   LABSPEC 1.008 07/06/2016 1237   PHURINE 7.0 07/06/2016 1237   GLUCOSEU NEGATIVE 07/06/2016 1237   GLUCOSEU NEGATIVE 05/26/2015 1507   HGBUR NEGATIVE 07/06/2016 1237   HGBUR moderate 10/05/2010 0825   BILIRUBINUR NEGATIVE 07/06/2016 1237   KETONESUR NEGATIVE 07/06/2016 1237   PROTEINUR NEGATIVE 07/06/2016 1237   UROBILINOGEN 1.0 11/03/2015 0657   NITRITE NEGATIVE 07/06/2016 1237   LEUKOCYTESUR NEGATIVE 07/06/2016 1237   Sepsis Labs: @LABRCNTIP (procalcitonin:4,lacticidven:4) ) Recent Results (from the past 240 hour(s))  Urine culture     Status: None   Collection Time: 07/06/16 12:37 PM  Result Value Ref Range Status   Specimen Description URINE, CLEAN CATCH  Final   Special Requests NONE  Final   Culture NO GROWTH Performed at [facility]   Final   Report Status 07/08/2016 FINAL  Final         Radiology Studies: Ct Chest Wo Contrast  07/06/2016  CLINICAL DATA:  82 year old female with a history of fall and rib pain EXAM: CT CHEST WITHOUT CONTRAST TECHNIQUE: Multidetector CT  imaging of the chest was performed following the standard protocol without IV contrast. COMPARISON:  Chest x-ray 07/06/2016, prior CT 04/08/2015, thoracic plain film 11/23/2015 FINDINGS: Chest: Chest Wall: Superficial soft tissues unremarkable. No axillary or supraclavicular adenopathy. Thoracic Inlet: Unremarkable appearance of the thoracic inlet. Unremarkable thyroid. Airways:  No endotracheal debris. No bronchial wall thickening. Mediastinum: Borderline enlarged lymph nodes of the mediastinum, similar configuration to the comparison CT study, largest in the lowest peritracheal measures 13 mm, unchanged from prior. Calcified left hilar lymph nodes. No hiatal hernia. Heart: Borderline enlarged heart. Calcifications of left main, left anterior descending, circumflex, right coronary artery. Calcifications of the aortic valve. No pericardial fluid/thickening. Vasculature: Vasculature not well evaluated given the  absence of intravenous contrast. No periaortic fluid. No aneurysm. Dense calcifications of the ascending aorta, transverse arch, descending thoracic aorta. No periaortic fluid. Calcifications of the branch vessels. Lungs: Advanced paraseptal and centrilobular emphysema. Small moderate bilateral pleural effusions. Interlobular septal thickening with thickening of the bilateral a fissures. Trace atelectasis bilaterally. Sub solid nodule measuring 9 mm of the right upper lobe, new from the comparison CT of 1 year prior No endotracheal debris. Upper abdomen: Calcifications of the spleen and liver parenchyma. Musculoskeletal: No displaced fracture. Surgical changes of the left shoulder incompletely imaged. T12 compression fracture present on prior imaging, similar in configuration. Multilevel degenerative changes of the thoracic spine. IMPRESSION: No displaced fracture identified. Advanced paraseptal and centrilobular emphysema with evidence of superimposed edema. Associated small to moderate pleural effusions. Sub  solid nodule of the right upper lobe measures 9 mm. Guidelines for follow-up include initial follow-up noncontrast CT in 3 months. Aortic atherosclerosis with associated left main and 3 vessel coronary artery disease. Signed, Dulcy Fanny. Earleen Newport, DO [facility] Radiology Electronically Signed   By: Corrie Mckusick D.O.   On: 07/06/2016 16:32      Scheduled Meds: . acidophilus  1 capsule Oral Daily  . antiseptic oral rinse  7 mL Mouth Rinse BID  . apixaban  2.5 mg Oral BID  . apraclonidine  1 drop Right Eye Q12H  . calcium-vitamin D  2 tablet Oral Q breakfast  . chlorhexidine  15 mL Mouth/Throat QID  . digoxin  125 mcg Oral Daily  . diltiazem  120 mg Oral Daily  . estradiol  2 g Vaginal Once per day on Wed Sat  . ezetimibe-simvastatin  1 tablet Oral Daily  . furosemide  40 mg Intravenous BID  . levothyroxine  50 mcg Oral QAC breakfast  . mometasone-formoterol  2 puff Inhalation BID  . pantoprazole  40 mg Oral Daily  . polyethylene glycol  17 g Oral Daily  . senna-docusate  2 tablet Oral BID  . sertraline  75 mg Oral Daily  . sodium chloride flush  3 mL Intravenous Q12H  . timolol  1 drop Both Eyes BID  . trimethoprim  100 mg Oral Q48H   Continuous Infusions: . sodium chloride 10 mL/hr (07/06/16 1739)     LOS: 1 day    Time spent in minutes: 21     [place], MD [facility] Pager: [email or web address] Password [email or web address] 07/08/2016, 2:59 PM

## 2016-07-09 ENCOUNTER — Inpatient Hospital Stay (HOSPITAL_COMMUNITY): Payer: Medicare Other

## 2016-07-09 ENCOUNTER — Ambulatory Visit: Payer: Medicare Other | Admitting: Internal Medicine

## 2016-07-09 DIAGNOSIS — J90 Pleural effusion, not elsewhere classified: Secondary | ICD-10-CM | POA: Insufficient documentation

## 2016-07-09 DIAGNOSIS — J438 Other emphysema: Secondary | ICD-10-CM

## 2016-07-09 DIAGNOSIS — J9621 Acute and chronic respiratory failure with hypoxia: Secondary | ICD-10-CM

## 2016-07-09 DIAGNOSIS — R0902 Hypoxemia: Secondary | ICD-10-CM

## 2016-07-09 DIAGNOSIS — I5031 Acute diastolic (congestive) heart failure: Secondary | ICD-10-CM

## 2016-07-09 DIAGNOSIS — J449 Chronic obstructive pulmonary disease, unspecified: Secondary | ICD-10-CM | POA: Insufficient documentation

## 2016-07-09 DIAGNOSIS — I272 Other secondary pulmonary hypertension: Secondary | ICD-10-CM

## 2016-07-09 LAB — BASIC METABOLIC PANEL
ANION GAP: 8 (ref 5–15)
BUN: 22 mg/dL — AB (ref 6–20)
CHLORIDE: 98 mmol/L — AB (ref 101–111)
CO2: 34 mmol/L — AB (ref 22–32)
CREATININE: 0.93 mg/dL (ref 0.44–1.00)
Calcium: 8.9 mg/dL (ref 8.9–10.3)
GFR calc non Af Amer: 54 mL/min — ABNORMAL LOW (ref >60)
Glucose, Bld: 131 mg/dL — ABNORMAL HIGH (ref 65–99)
POTASSIUM: 3.7 mmol/L (ref 3.5–5.1)
Sodium: 140 mmol/L (ref 135–145)

## 2016-07-09 MED ORDER — FUROSEMIDE 40 MG PO TABS
40.0000 mg | ORAL_TABLET | Freq: Two times a day (BID) | ORAL | Status: DC
  Administered 2016-07-09 – 2016-07-11 (×4): 40 mg via ORAL
  Filled 2016-07-09 (×4): qty 1

## 2016-07-09 MED ORDER — VITAMINS A & D EX OINT
TOPICAL_OINTMENT | CUTANEOUS | Status: AC
  Administered 2016-07-09: 12:00:00
  Filled 2016-07-09: qty 5

## 2016-07-09 NOTE — Progress Notes (Signed)
Went over heart failure booklet with zone education with patient, daughter and husband who all verbalized understanding.  Gave booklet to daughter for her to review more and help patient with daily weights as well as watching her fluid and sodium intake.  Discussed importance of keeping a log of weights and notifying provider if she gains more than 3 lbs  In one day or 5 lbs in a week.

## 2016-07-09 NOTE — Consult Note (Addendum)
Name: Kristine Simmons MRN: [id number] DOB: Jun 21, 1927    ADMISSION DATE:  07/06/2016 CONSULTATION DATE:  7/18  REFERRING MD :  Wynelle Cleveland   CHIEF COMPLAINT:  Hypoxia   BRIEF PATIENT DESCRIPTION:  82 year old female w/ chronic hypoxic respiratory failure in setting of severe emphysema, severe secondary PAH, and chronic aspiration. Admitted again for acute on chronic hypoxic respiratory failure in setting of decompensated diastolic dysfxn and cor pulmonale. PCCM asked to see to assist w/ prognostics re; her hypoxia and to see if there is anything else to add from a pulm stand-point   SIGNIFICANT EVENTS    STUDIES:     HISTORY OF PRESENT ILLNESS:  82 year old female f/b BMQ for chronic respiratory failure in setting of severe emphysema, secondary PAH and chronic aspiration. She was just recently admitted for acute on chronic respiratory failure in the setting of recurrent aspiration. She was discharged to home on increased oxygen and antibiotics. She was again seen in pulmonary clinic on 6/12 still noting episodic hypoxia w/ sats as low as 80s. CXR was obtained and she was treated w/ more antibiotics for presumed on-going pna. She returns to the ER on 7/15 s/p a fall and subsequent rib pain on left. In evaluation she was found to be negative for rib fracture but was significantly hypoxic w/ 76% and BNP elevated. She was admitted w/ working diagnosis of decompensated diastolic HF. As of 7/18 her volume status is negative 1.4 liters and she is about at baseline. PCCM was asked to see to help explain the extent of her lung disease and assess if anything further to add.   PAST MEDICAL HISTORY :   has a past medical history of colonic polyps; Atrial fibrillation ([place]); Fracture (1995, 2009); Blindness; Cystitis; Complication of anesthesia; Hyperlipidemia; Osteoporosis; Cerebrovascular accident [facility]) (01/09); Hypothyroidism; Spinal stenosis of lumbar region (01/10/2016); Acute diastolic heart failure  ([place]) (11/03/2015); Aortic stenosis, mild (08/15/2014); Cardiorenal syndrome (11/05/2015); Chronic anticoagulation (08/15/2014); COPD, Gold grade A (04/08/2008); CAROTID STENOSIS (08/08/2009); Depression (09/22/2014); G E R D (06/02/2007); HYPERTENSION, ESSENTIAL NOS (12/01/2007); T12 compression fracture ([place]) (11/02/2015); and Squamous cell carcinoma of neck ([place]) (03/09/2014).  has past surgical history that includes Tonsillectomy and adenoidectomy; Vocal Cord Polyps; Thyroid Needle Biopsy (2002); Appendectomy; Cataract extraction; Colonoscopy w/ polypectomy (2005); Total hip arthroplasty (2008); Total shoulder replacement (2010); Mohs surgery (2013); Excision metacarpal mass (Right, 08/17/2013); Femoral hernia repair (Right, 08/17/2014); Insertion of mesh (08/17/2014); and Inguinal hernia repair (Right, 11/15/2014). Prior to Admission medications   Medication Sig Start Date End Date Taking? Authorizing Provider  albuterol (VENTOLIN HFA) 108 (90 BASE) MCG/ACT inhaler Inhale 2 puffs into the lungs every 4 (four) hours as needed for wheezing or shortness of breath.    Yes Historical Provider, MD  amoxicillin-clavulanate (AUGMENTIN) 875-125 MG tablet Take 1 tablet by mouth 2 (two) times daily. 07/04/16 07/11/16 Yes Magdalen Spatz, NP  apraclonidine (IOPIDINE) 0.5 % ophthalmic solution Place 1 drop into the right eye every 12 (twelve) hours.   Yes Historical Provider, MD  budesonide-formoterol (SYMBICORT) 80-4.5 MCG/ACT inhaler Inhale 2 puffs into the lungs 2 (two) times daily. 02/02/16  Yes Juanito Doom, MD  calcium-vitamin D (OSCAL WITH D) 500-200 MG-UNIT tablet Take 2 tablets by mouth daily with breakfast.   Yes Historical Provider, MD  [place] 125 MCG tablet TAKE 1 TABLET ONCE DAILY. 05/26/15  Yes Minus Breeding, MD  diltiazem (CARDIZEM CD) 120 MG 24 hr capsule TAKE (1) CAPSULE DAILY. 05/27/16  Yes Minus Breeding,  MD  ELIQUIS 2.5 MG TABS tablet TAKE 1 TABLET TWICE DAILY. 05/18/16  Yes Minus Breeding, MD  ENSURE PLUS  (ENSURE PLUS) LIQD TAKE ONE CAN BY MOUTH ONCE DAILY 02/13/16  Yes Binnie Rail, MD  estradiol (ESTRACE) 0.1 MG/GM vaginal cream Place 2 g vaginally 2 (two) times a week. On Wednesdays & Saturdays.   Yes Historical Provider, MD  furosemide (LASIX) 40 MG tablet Take 1 tablet (40 mg total) by mouth daily. 01/10/16  Yes Biagio Borg, MD  levothyroxine (SYNTHROID, LEVOTHROID) 50 MCG tablet TAKE 1 TABLET ONCE DAILY. 06/21/16  Yes Binnie Rail, MD  OXYGEN Inhale into the lungs continuous. Sitting or resting flow should be on 4 L  And during activities use 5-6 L   Yes Historical Provider, MD  pantoprazole (PROTONIX) 40 MG tablet Take 1 tablet (40 mg total) by mouth daily. 110mins prior to dinner 07/03/16  Yes Magdalen Spatz, NP  polyethylene glycol powder (GLYCOLAX/MIRALAX) powder Take 17 g by mouth daily. Mix 1 capful of powder and dissolve it in any liquid. Drink once daily. 10/30/15  Yes Forde Dandy, MD  Probiotic Product (MISC INTESTINAL FLORA REGULAT) CAPS Take 1 capsule by mouth daily.     Yes Historical Provider, MD  sertraline (ZOLOFT) 25 MG tablet Take 1 tablet (25 mg total) by mouth daily. In addition to 50 mg daily for a total of 75 mg daily 04/04/16  Yes Binnie Rail, MD  sertraline (ZOLOFT) 50 MG tablet Take 1 tablet (50 mg total) by mouth daily. 01/10/16  Yes Biagio Borg, MD  Spacer/Aero-Holding Chambers (AEROCHAMBER MV) inhaler Use as instructed 07/03/16  Yes Magdalen Spatz, NP  timolol (TIMOPTIC) 0.5 % ophthalmic solution Place 1 drop into both eyes 2 (two) times daily.    Yes Historical Provider, MD  trimethoprim (TRIMPEX) 100 MG tablet Take 100 mg by mouth every other day.    Yes Historical Provider, MD  VYTORIN 10-40 MG tablet TAKE 1 TABLET ONCE DAILY. 06/11/16  Yes Binnie Rail, MD   Allergies  Allergen Reactions  . Hydrocodone Nausea And Vomiting  . Desmopressin Acetate     REACTION: swelling feet  . Dorzolamide Hcl Other (See Comments)    Burning in eyes and redness  . Prempro [Conj  Estrog-Medroxyprogest Ace]     unknown  . Ciprofloxacin     REACTION: vaginal infection    FAMILY HISTORY:  family history includes Breast cancer in her sister; Cancer in her brother and mother; Colon cancer in her brother; Diabetes in her father; Heart attack in her brother; Leukemia in her mother; Lung cancer in her sister; Ovarian cancer in her sister; Prostate cancer in her brother; Stroke in her father. SOCIAL HISTORY:  reports that she quit smoking about 22 years ago. Her smoking use included Cigarettes. She has a 45 pack-year smoking history. She has never used smokeless tobacco. She reports that she does not drink alcohol or use illicit drugs.  REVIEW OF SYSTEMS:   Constitutional: Negative for fever, chills, weight loss, malaise/fatigue and diaphoresis.  HENT: Negative for hearing loss, ear pain, nosebleeds, congestion, sore throat, neck pain, tinnitus and ear discharge.   Eyes: Negative for blurred vision, double vision, photophobia, pain, discharge and redness.  Respiratory: Negative for cough except w/ pos, hemoptysis, sputum production, + shortness of breath->improved, wheezing and stridor.   Cardiovascular: Negative for chest pain, palpitations, orthopnea, claudication, leg swelling and PND.  Gastrointestinal: Negative for heartburn, nausea, vomiting, abdominal pain, diarrhea,  constipation, blood in stool and melena.  Genitourinary: Negative for dysuria, urgency, frequency, hematuria and flank pain.  Musculoskeletal: Negative for myalgias, back pain, joint pain and falls.  Skin: Negative for itching and rash.  Neurological: Negative for dizziness, tingling, tremors, sensory change, speech change, focal weakness, seizures, loss of consciousness, weakness and headaches.  Endo/Heme/Allergies: Negative for environmental allergies and polydipsia. Does not bruise/bleed easily.  SUBJECTIVE:  Feels tired  VITAL SIGNS: Temp:  [97.6 F (36.4 C)-98.2 F (36.8 C)] 98.2 F (36.8 C)  (07/18 0605) Pulse Rate:  [63-67] 67 (07/18 0605) Resp:  [18-20] 18 (07/18 0605) BP: (113-138)/(65-71) 138/65 mmHg (07/18 0605) SpO2:  [87 %-94 %] 88 % (07/18 1217) Weight:  [117 lb 6.4 oz (53.252 kg)] 117 lb 6.4 oz (53.252 kg) (07/18 [id number])  PHYSICAL EXAMINATION: General:  Frail 82 year old female, sitting up in bed. Not in distress.  Neuro:  Awake, oriented, no focal motor def. Left facial droop  HEENT:  Left facial droop sp clear. No JVD  Cardiovascular:  RRR, no MRG  Lungs:  Crackles both bases, no accessory muscle use Abdomen:  Soft, not tender. + bowel sounds  Musculoskeletal:  Equal st and bulk  Skin:  Warm and dry    Recent Labs Lab 07/07/16 0446 07/08/16 0435 07/09/16 0823  NA 140 141 140  K 3.6 5.1 3.7  CL 98* 101 98*  CO2 32 35* 34*  BUN 19 19 22*  CREATININE 1.01* 1.17* 0.93  GLUCOSE 99 116* 131*    Recent Labs Lab 07/06/16 1315 07/08/16 0435  HGB 14.3 13.3  HCT 44.3 41.8  WBC 7.8 7.9  PLT 183 177   Dg Chest Port 1 View  07/09/2016  CLINICAL DATA:  Follow-up pleural effusion. History of emphysema, hypertension and hyperlipidemia. EXAM: PORTABLE CHEST 1 VIEW COMPARISON:  Chest radiograph July 06, 2016 FINDINGS: The cardiac silhouette is mildly enlarged and unchanged. Calcified aortic knob. Calcified hilar lymph nodes. Small LEFT pleural effusion. Increased lung volumes with chronic bronchitic changes. Decreased pulmonary vascular congestion and resolution of RIGHT pleural effusion on frontal radiograph. No pneumothorax. Status post LEFT shoulder arthroplasty. Osteopenia. IMPRESSION: Decreased vascular congestion. Mild cardiomegaly and small residual LEFT pleural effusion. COPD. Electronically Signed   By: Elon Alas M.D.   On: 07/09/2016 12:03    ASSESSMENT / PLAN: Chronic hypoxic respiratory failure  Severe Emphysema Grade D COPD Chronic aspiration  Recent pneumonia  Severe secondary PAH Acute on chronic diastolic dysfunction  Cor pulmonale  DNR  status  Acute on Chronic Hypoxic Respiratory failure in setting of decompensated diastolic HF w/ volume overload superimposed on Severe Emphysema, severe secondary PAH and chronic aspiration.   Discussion.  Isiss has been clinically declining really since about February this year when she was requiring higher Oxygen support. She was recently admitted for recurrent aspiration and really has not improved that much since her discharge. I fear that this is more likely a new baseline for her rather than something acute to fix. Even on her last f/u she was having desaturations into the low 80s. Suspect she is actually much worse that this at times and that the frequency is likely more often then we thought. This would account for increased heart strain and her eventual admission for decompensated Cor pulmonale and acute on chronic diastolic dysfunction. She is NOT having an acute COPD exacerbation. We discussed her current disease at length w/ her husband at the bedside and her daughter over speaker phone. We discussed her desire to  still eat and not have feeding tube, we discussed her oxygen use at home and we discussed the current state of disease. There is little we have to offer her at this point other than ensuring she has adequate oxygen at home, continue aspiration precautions and continue general supportive care. We discussed bringing in home hospice to assist w/ her care which she and her family are in agreement with.   Plan Get oximizer to ensure her O2 sats are > 88% at all times Continue BDs Cont aspiration precautions  Dc abx Lasix as BUN/creatinine will allow  Will likely need differentO2 concentrator at home as her's only goes to 5 liters Will place consult to get home hospice involved.  Full DNR   Erick Colace ACNP-BC [place] Pager # [phone] OR # [phone] if no answer  Pulmonary and [place] Pager: [phone]  07/09/2016, 4:07 PM   Attending Note:  I have examined patient, reviewed labs, studies and notes. I have discussed the case with Jerrye Bushy, and I agree with the data and plans as amended above. Very pleasant 82 yo woman followed by Dr [place] Bells with COPD, HTN/diastolic dysfxn, secondary PAH and chronic hypoxemic resp failure. Interestingly her obstruction on PFT's from 12/2014 was not very severe (FEV1 1.81L, 87%). Her overall functional capacity, hypoxemia have been worse since recent aspiration PNA. She had some recovery but not a full recovery to prior baseline. She consistently desaturates w exertion, apparently out-of proportion to her COPD.  Suspect that her current acute worsening was due to the effects of the PNA and to some worsening in her diastolic CHF, harder to keep in balance in setting of worsening lung fxn and hypoxemia. Diuresis has helped her, and she appears to be approaching her pre-hospitalization baseline. I believe the biggest intervention we can offer right now is to maximize our ability to oxygenate her - would use an oximyzer with concentration that can manage greater flow than 5L/min (sometimes this takes 2 concentrators in parallel. DME company should be able to do this. Would consider adding LAMA to her Symbicort, but again her airflows are not profoundly abnormal. She may have a mostly emphysematous phenotype to her COPD that would explain severe hypoxemia with mostly preserved / hyperinflated FEV1. Consider repeat PFT's once she is recovered from this episode, and then revisit addition of LAMA. She may benefit from pulmonary rehab - we can discuss with her outpt.    Baltazar Apo, MD, PhD 07/10/2016, 9:45 AM [facility] [phone] or if no answer [phone]

## 2016-07-09 NOTE — Progress Notes (Addendum)
PROGRESS NOTE    Kristine Simmons  [id number] DOB: May 03, 1927 DOA: 07/06/2016  PCP: Binnie Rail, MD   Brief Narrative:  Kristine Simmons is a 82 y.o. female who has oxygen dependent copd (4 L at rest at baseline), chronic afib on anticoagulation with apixaban, cva with left eye blindness,  diastolic chf, h/o aspiration pneumonia who brought to the ED due to fall with left rib pain.  ED course: cxr did not showed rib fracture,but she is found to be hypoxic, 02 sats 76 on 4liter oxygen. ua unremarkable, labs with elevated bnp at 827, otherwise unremarkable at baseline. he was given lasix, hospitalist called to admit the patient.   Patient admitted to ongoing cough after she completed treatment for pneumonia. CT scan shows no pneumonia but pulmonary edema with pleural effusions. She admits to having a very dry mouth and constantly drinking water.   Pulse ox recently was 85% on 4 L and 94% on 5 L at the pulmonary office.   Subjective: No new complaints today.   Assessment & Plan:   Principal Problem:   Acute diastolic heart failure Acute on chronic respiratory failure with hypoxia  Recent aspiration pneumonia - diuretics per cardiology - they are switching her back to oral Lasix  Today- CXR shows improvement in edema - advised not to drink more than 1 L of water a day -pulse ox has NOT improved with diuresis- currently requiring 6.5 L O2 to keep pulse ox at 85%  Active Problems:    COPD, Gold stage A   Moderate to severe pulmonary hypertension   Chronic respiratory failure - on chronic O2, Dulera, Nebs PRN - Protonix recently started on 7/12 by pulmonary - have asked for pulmonary consult- may ultimately need hospice     Essential hypertension - cont home meds    Atrial fibrillation  - cont Apixaban, Dig and Cardizem   Hypothyroid Synthroid    DVT prophylaxis: Apixaban Code Status: do not intubate Family Communication: husband Disposition Plan: home in 1-2  days Consultants:   Cardiology   Pulmonary  Procedures:   none Antimicrobials:  Anti-infectives    Start     Dose/Rate Route Frequency Ordered Stop   07/08/16 1000  trimethoprim (TRIMPEX) tablet 100 mg     100 mg Oral Every 48 hours 07/06/16 1611       Objective: Filed Vitals:   07/08/16 1500 07/08/16 2044 07/09/16 0605 07/09/16 1217  BP: 100/48 113/71 138/65   Pulse: 69 63 67   Temp: 97.7 F (36.5 C) 97.6 F (36.4 C) 98.2 F (36.8 C)   TempSrc: Oral Oral Oral   Resp: 20 20 18    Height:      Weight:   53.252 kg (117 lb 6.4 oz)   SpO2: 92% 94% 87% 88%    Intake/Output Summary (Last 24 hours) at 07/09/16 1434 Last data filed at 07/09/16 1000  Gross per 24 hour  Intake    120 ml  Output   1275 ml  Net  -1155 ml   Filed Weights   07/06/16 1640 07/08/16 0550 07/09/16 0605  Weight: 54.159 kg (119 lb 6.4 oz) 53.661 kg (118 lb 4.8 oz) 53.252 kg (117 lb 6.4 oz)    Examination: General exam: Appears comfortable  HEENT: PERRLA, oral mucosa moist, no sclera icterus or thrush Respiratory system: Clear to auscultation. Decreased breath sounds at bases Cardiovascular system: S1 & S2 heard, RRR.  No murmurs  Gastrointestinal system: Abdomen soft, non-tender, nondistended. Normal bowel  sound. No organomegaly Central nervous system: Alert and oriented. No focal neurological deficits. Extremities: No cyanosis, clubbing or edema Skin: No rashes or ulcers Psychiatry:  Mood & affect appropriate.     Data Reviewed: I have personally reviewed following labs and imaging studies  CBC:  Recent Labs Lab 07/06/16 1315 07/08/16 0435  WBC 7.8 7.9  NEUTROABS 5.4  --   HGB 14.3 13.3  HCT 44.3 41.8  MCV 84.2 84.6  PLT 183 [id number]   Basic Metabolic Panel:  Recent Labs Lab 07/06/16 1315 07/07/16 0446 07/08/16 0435 07/09/16 0823  NA 139 140 141 140  K 3.6 3.6 5.1 3.7  CL 99* 98* 101 98*  CO2 30 32 35* 34*  GLUCOSE 115* 99 116* 131*  BUN 21* 19 19 22*  CREATININE 1.11*  1.01* 1.17* 0.93  CALCIUM 9.1 8.6* 9.0 8.9  MG  --  1.8  --   --    GFR: Estimated Creatinine Clearance: 35.9 mL/min (by C-G formula based on Cr of 0.93). Liver Function Tests:  Recent Labs Lab 07/06/16 1315  AST 41  ALT 23  ALKPHOS 47  BILITOT 1.2  PROT 6.9  ALBUMIN 4.0   No results for input(s): LIPASE, AMYLASE in the last 168 hours. No results for input(s): AMMONIA in the last 168 hours. Coagulation Profile: No results for input(s): INR, PROTIME in the last 168 hours. Cardiac Enzymes:  Recent Labs Lab 07/06/16 1315  TROPONINI 0.04*   BNP (last 3 results) No results for input(s): PROBNP in the last 8760 hours. HbA1C: No results for input(s): HGBA1C in the last 72 hours. CBG: No results for input(s): GLUCAP in the last 168 hours. Lipid Profile: No results for input(s): CHOL, HDL, LDLCALC, TRIG, CHOLHDL, LDLDIRECT in the last 72 hours. Thyroid Function Tests: No results for input(s): TSH, T4TOTAL, FREET4, T3FREE, THYROIDAB in the last 72 hours. Anemia Panel: No results for input(s): VITAMINB12, FOLATE, FERRITIN, TIBC, IRON, RETICCTPCT in the last 72 hours. Urine analysis:    Component Value Date/Time   COLORURINE YELLOW 07/06/2016 [place] 07/06/2016 1237   LABSPEC 1.008 07/06/2016 1237   PHURINE 7.0 07/06/2016 1237   GLUCOSEU NEGATIVE 07/06/2016 1237   GLUCOSEU NEGATIVE 05/26/2015 1507   HGBUR NEGATIVE 07/06/2016 1237   HGBUR moderate 10/05/2010 0825   BILIRUBINUR NEGATIVE 07/06/2016 1237   KETONESUR NEGATIVE 07/06/2016 1237   PROTEINUR NEGATIVE 07/06/2016 1237   UROBILINOGEN 1.0 11/03/2015 0657   NITRITE NEGATIVE 07/06/2016 1237   LEUKOCYTESUR NEGATIVE 07/06/2016 1237   Sepsis Labs: @LABRCNTIP (procalcitonin:4,lacticidven:4) ) Recent Results (from the past 240 hour(s))  Urine culture     Status: None   Collection Time: 07/06/16 12:37 PM  Result Value Ref Range Status   Specimen Description URINE, CLEAN CATCH  Final   Special  Requests NONE  Final   Culture NO GROWTH Performed at [facility]   Final   Report Status 07/08/2016 FINAL  Final         Radiology Studies: Dg Chest Port 1 View  07/09/2016  CLINICAL DATA:  Follow-up pleural effusion. History of emphysema, hypertension and hyperlipidemia. EXAM: PORTABLE CHEST 1 VIEW COMPARISON:  Chest radiograph July 06, 2016 FINDINGS: The cardiac silhouette is mildly enlarged and unchanged. Calcified aortic knob. Calcified hilar lymph nodes. Small LEFT pleural effusion. Increased lung volumes with chronic bronchitic changes. Decreased pulmonary vascular congestion and resolution of RIGHT pleural effusion on frontal radiograph. No pneumothorax. Status post LEFT shoulder arthroplasty. Osteopenia. IMPRESSION: Decreased vascular congestion. Mild cardiomegaly and  small residual LEFT pleural effusion. COPD. Electronically Signed   By: Elon Alas M.D.   On: 07/09/2016 12:03      Scheduled Meds: . acidophilus  1 capsule Oral Daily  . antiseptic oral rinse  7 mL Mouth Rinse BID  . apixaban  2.5 mg Oral BID  . apraclonidine  1 drop Right Eye Q12H  . calcium-vitamin D  2 tablet Oral Q breakfast  . chlorhexidine  15 mL Mouth/Throat QID  . digoxin  125 mcg Oral Daily  . diltiazem  120 mg Oral Daily  . estradiol  2 g Vaginal Once per day on Wed Sat  . ezetimibe-simvastatin  1 tablet Oral Daily  . furosemide  40 mg Oral BID  . levothyroxine  50 mcg Oral QAC breakfast  . mometasone-formoterol  2 puff Inhalation BID  . pantoprazole  40 mg Oral Daily  . polyethylene glycol  17 g Oral Daily  . senna-docusate  2 tablet Oral BID  . sertraline  75 mg Oral Daily  . sodium chloride flush  3 mL Intravenous Q12H  . timolol  1 drop Both Eyes BID  . trimethoprim  100 mg Oral Q48H   Continuous Infusions: . sodium chloride 10 mL/hr (07/06/16 1739)     LOS: 2 days    Time spent in minutes: 31     [place], MD [facility] Pager: [email or web address] Password [facility] 07/09/2016, 2:34 PM

## 2016-07-09 NOTE — Progress Notes (Signed)
Patient Profile: 82 y/o female with h/o chronic atrial fibrillation on Eliquis, prior CVA resulting in left eye blindness, chronic diastolic HF (EF of 123456 by echo 10/2015), moderate TR, moderate- severe pulmonary HTN, O2 dependent COPD (baseline 4-6L) and prior recent h/o CAP admitted for acute on chronic respiratory failure in the setting of acute on chronic diastolic CHF and recent outpatient treatment for CAP.   Subjective: Feels a bit better today but still on 6L [place] (baseline 4-5 L/min) Main complaint is shoulder pain from her recent fall.   Objective: Vital signs in last 24 hours: Temp:  [97.6 F (36.4 C)-98.2 F (36.8 C)] 98.2 F (36.8 C) (07/18 0605) Pulse Rate:  [63-69] 67 (07/18 0605) Resp:  [18-20] 18 (07/18 0605) BP: (100-138)/(48-71) 138/65 mmHg (07/18 0605) SpO2:  [87 %-94 %] 87 % (07/18 0605) Weight:  [117 lb 6.4 oz (53.252 kg)] 117 lb 6.4 oz (53.252 kg) (07/18 0605) Last BM Date: 07/08/16  Intake/Output from previous day: 07/17 0701 - 07/18 0700 In: 480 [P.O.:480] Out: 1050 [Urine:1050] Intake/Output this shift: Total I/O In: -  Out: 400 [Urine:400]  Medications Current Facility-Administered Medications  Medication Dose Route Frequency Provider Last Rate Last Dose  . 0.9 %  sodium chloride infusion   Intravenous Continuous Lacretia Leigh, MD 10 mL/hr at 07/06/16 1739 10 mL/hr at 07/06/16 1739  . acidophilus (RISAQUAD) capsule 1 capsule  1 capsule Oral Daily Florencia Reasons, MD   1 capsule at 07/09/16 1035  . albuterol (PROVENTIL) (2.5 MG/3ML) 0.083% nebulizer solution 3 mL  3 mL Inhalation Q4H PRN Florencia Reasons, MD      . antiseptic oral rinse (CPC / CETYLPYRIDINIUM CHLORIDE 0.05%) solution 7 mL  7 mL Mouth Rinse BID Debbe Odea, MD   7 mL at 07/09/16 1037  . apixaban (ELIQUIS) tablet 2.5 mg  2.5 mg Oral BID Florencia Reasons, MD   2.5 mg at 07/09/16 1036  . apraclonidine (IOPIDINE) 0.5 % ophthalmic solution 1 drop  1 drop Right Eye Q12H Florencia Reasons, MD   1 drop at 07/09/16 1037  .  calcium-vitamin D (OSCAL WITH D) 500-200 MG-UNIT per tablet 2 tablet  2 tablet Oral Q breakfast Florencia Reasons, MD   2 tablet at 07/09/16 0743  . chlorhexidine (PERIDEX) 0.12 % solution 15 mL  15 mL Mouth/Throat QID Debbe Odea, MD   15 mL at 07/09/16 1035  . digoxin (LANOXIN) tablet 125 mcg  125 mcg Oral Daily Florencia Reasons, MD   125 mcg at 07/09/16 1036  . diltiazem (CARDIZEM CD) 24 hr capsule 120 mg  120 mg Oral Daily Florencia Reasons, MD   120 mg at 07/09/16 1036  . estradiol (ESTRACE) vaginal cream 2 g  2 g Vaginal Once per day on Wed Sat Florencia Reasons, MD   2 g at 07/06/16 2107  . ezetimibe-simvastatin (VYTORIN) 10-40 MG per tablet 1 tablet  1 tablet Oral Daily Florencia Reasons, MD   1 tablet at 07/09/16 1035  . furosemide (LASIX) injection 40 mg  40 mg Intravenous BID Florencia Reasons, MD   40 mg at 07/09/16 0743  . levothyroxine (SYNTHROID, LEVOTHROID) tablet 50 mcg  50 mcg Oral QAC breakfast Florencia Reasons, MD   50 mcg at 07/09/16 0743  . mometasone-formoterol (DULERA) 100-5 MCG/ACT inhaler 2 puff  2 puff Inhalation BID Florencia Reasons, MD   2 puff at 07/08/16 2036  . morphine 2 MG/ML injection 2 mg  2 mg Intravenous Q30 min PRN Lacretia Leigh, MD   2  mg at 07/06/16 1508  . pantoprazole (PROTONIX) EC tablet 40 mg  40 mg Oral Daily Florencia Reasons, MD   40 mg at 07/09/16 1035  . polyethylene glycol (MIRALAX / GLYCOLAX) packet 17 g  17 g Oral Daily Berton Mount, RPH   17 g at 07/09/16 1035  . senna-docusate (Senokot-S) tablet 2 tablet  2 tablet Oral BID Florencia Reasons, MD   2 tablet at 07/09/16 1035  . sertraline (ZOLOFT) tablet 75 mg  75 mg Oral Daily Florencia Reasons, MD   75 mg at 07/09/16 1035  . sodium chloride flush (NS) 0.9 % injection 3 mL  3 mL Intravenous Q12H Florencia Reasons, MD   3 mL at 07/09/16 1037  . timolol (TIMOPTIC) 0.5 % ophthalmic solution 1 drop  1 drop Both Eyes BID Florencia Reasons, MD   1 drop at 07/09/16 1037  . trimethoprim (TRIMPEX) tablet 100 mg  100 mg Oral Q48H Florencia Reasons, MD   100 mg at 07/08/16 1025    PE: General exam: Appears comfortable  HEENT:  PERRLA, oral mucosa moist, no sclera icterus or thrush Respiratory system: Clear to auscultation. Decreased breath sounds at bases Cardiovascular system: S1 & S2 heard, RRR. No murmurs  Gastrointestinal system: Abdomen soft, non-tender, nondistended. Normal bowel sound. No organomegaly Central nervous system: Alert and oriented. No focal neurological deficits. Extremities: No cyanosis, clubbing or edema Skin: No rashes or ulcers Psychiatry: Mood & affect appropriate.   Lab Results:   Recent Labs  07/06/16 1315 07/08/16 0435  WBC 7.8 7.9  HGB 14.3 13.3  HCT 44.3 41.8  PLT 183 177   BMET  Recent Labs  07/07/16 0446 07/08/16 0435 07/09/16 0823  NA 140 141 140  K 3.6 5.1 3.7  CL 98* 101 98*  CO2 32 35* 34*  GLUCOSE 99 116* 131*  BUN 19 19 22*  CREATININE 1.01* 1.17* 0.93  CALCIUM 8.6* 9.0 8.9     Assessment/Plan  Principal Problem:   Acute diastolic heart failure (HCC) Active Problems:   Essential hypertension   Atrial fibrillation (HCC)   COPD, Gold grade A   Acute on chronic respiratory failure with hypoxia (HCC)   Moderate to severe pulmonary hypertension (HCC)   Hypoxia   1. Acute on Chronic Respiratory Failure: multifactorial, in the setting of acute on chronic HF, COPD, moderate to severe pulmonary HTN, ? Unresolved CAP. BNP was abnormal at 827. CXR and Chest CT showed small bilateral pleural effusions. She remains on 6L [place] (baseline supplemental O2 requirement is 4-5L [place]). However, she is not massively volume overloaded. Continue lasix for dHF. Consider switch back to PO lasix today. Continue strict I/Os, daily weights, low sodium diet. IM to management COPD.  2. Chronic Atrial Fibrillation: HR is well controlled with Cardizem and digoxin. This patients CHA2DS2-VASc Score and unadjusted Ischemic Stroke Rate (% per year) is equal to 11.2 % stroke rate/year from a score of 7 (CHF, Age >75 (2), prior CVA (2), HTN + female).  Above score calculated as 1 point  each if present [CHF, HTN, DM, Vascular=MI/PAD/Aortic Plaque, Age if 65-74, or Female] Above score calculated as 2 points each if present [Age > 75, or Stroke/TIA/TE]. - continue Eliquis for a/c.      LOS: 2 days    Kristine Simmons 07/09/2016 10:42 AM  History and all data above reviewed.  Patient examined.  I agree with the findings as above.  Breathing is better but not quite at baseline.  The patient  exam reveals [id number]  ,  Lungs: Clear  ,  Abd: Positive bowel sounds, no rebound no guarding, Ext No edema  .  All available labs, radiology testing, previous records reviewed. Agree with documented assessment and plan. Acute on chronic diastolic HF:  Switch to PO Lasix today.   Kristine Simmons  1:00 PM  07/09/2016

## 2016-07-10 ENCOUNTER — Telehealth: Payer: Self-pay | Admitting: Pulmonary Disease

## 2016-07-10 ENCOUNTER — Telehealth: Payer: Self-pay | Admitting: Emergency Medicine

## 2016-07-10 DIAGNOSIS — Z7189 Other specified counseling: Secondary | ICD-10-CM

## 2016-07-10 DIAGNOSIS — J9620 Acute and chronic respiratory failure, unspecified whether with hypoxia or hypercapnia: Secondary | ICD-10-CM

## 2016-07-10 DIAGNOSIS — Z515 Encounter for palliative care: Secondary | ICD-10-CM

## 2016-07-10 NOTE — Telephone Encounter (Signed)
Attempted to call hospice line was busy. Will try to call back later

## 2016-07-10 NOTE — Progress Notes (Signed)
Notified by Lorenza Chick [facility] of family request for [facility]ospice [facility] services at home after discharge. Chart and patient information currently under review to confirm hospice eligibility.   Spoke with family, at bedside to initiate education related to hospice philosophy, services and team approach to care. Family verbalized understanding of the information provided. Per discussion, plan is for discharge to home as early as tomorrow via [facility].   Please send signed completed DNR form home with patient.  Patient will need prescriptions for discharge comfort medications.   DME needs discussed and family denies any needs at this time.  They currently have an O2 concentrator that supports 10L.  [facility] aware of the above.  Completed discharge summary will need to be faxed to [facility] at [phone] when final.  Please notify [facility] when patient is ready to leave unit at discharge-call [phone].   [facility] information and contact numbers have been given to patient and daughter, Dotty during visit.   Please call with any questions.  Thank You,  Freddi Starr RN, [facility] Liaison  [phone]

## 2016-07-10 NOTE — Progress Notes (Signed)
Patient Profile: 82 y/o female with h/o chronic atrial fibrillation on Eliquis, prior CVA resulting in left eye blindness, chronic diastolic HF (EF of 123456 by echo 10/2015), moderate TR, moderate- severe pulmonary HTN, O2 dependent COPD (baseline 4-6L) and prior recent h/o CAP admitted for acute on chronic respiratory failure in the setting of acute on chronic diastolic CHF and recent outpatient treatment for CAP.   Subjective: Feels better today but still on 6L [place] (baseline 4-5 L/min).  Objective: Vital signs in last 24 hours: Temp:  [97.5 F (36.4 C)] 97.5 F (36.4 C) (07/19 0603) Pulse Rate:  [56-61] 61 (07/19 1002) Resp:  [16-18] 16 (07/19 1002) BP: (98-108)/(50-66) 98/50 mmHg (07/19 0603) SpO2:  [88 %-94 %] 92 % (07/19 1002) Weight:  [117 lb 6.4 oz (53.252 kg)] 117 lb 6.4 oz (53.252 kg) (07/19 0608) Last BM Date: 07/08/16  Intake/Output from previous day: 07/18 0701 - 07/19 0700 In: 120 [P.O.:120] Out: 1075 [Urine:1075] Intake/Output this shift:    Medications Current Facility-Administered Medications  Medication Dose Route Frequency Provider Last Rate Last Dose  . 0.9 %  sodium chloride infusion   Intravenous Continuous Lacretia Leigh, MD 10 mL/hr at 07/06/16 1739 10 mL/hr at 07/06/16 1739  . acidophilus (RISAQUAD) capsule 1 capsule  1 capsule Oral Daily Florencia Reasons, MD   1 capsule at 07/10/16 1007  . albuterol (PROVENTIL) (2.5 MG/3ML) 0.083% nebulizer solution 3 mL  3 mL Inhalation Q4H PRN Florencia Reasons, MD      . antiseptic oral rinse (CPC / CETYLPYRIDINIUM CHLORIDE 0.05%) solution 7 mL  7 mL Mouth Rinse BID Debbe Odea, MD   7 mL at 07/09/16 2200  . apixaban (ELIQUIS) tablet 2.5 mg  2.5 mg Oral BID Florencia Reasons, MD   2.5 mg at 07/10/16 1007  . apraclonidine (IOPIDINE) 0.5 % ophthalmic solution 1 drop  1 drop Right Eye Q12H Florencia Reasons, MD   1 drop at 07/10/16 1009  . calcium-vitamin D (OSCAL WITH D) 500-200 MG-UNIT per tablet 2 tablet  2 tablet Oral Q breakfast Florencia Reasons, MD   2 tablet  at 07/10/16 0817  . chlorhexidine (PERIDEX) 0.12 % solution 15 mL  15 mL Mouth/Throat QID Debbe Odea, MD   15 mL at 07/10/16 1009  . digoxin (LANOXIN) tablet 125 mcg  125 mcg Oral Daily Florencia Reasons, MD   125 mcg at 07/10/16 1008  . diltiazem (CARDIZEM CD) 24 hr capsule 120 mg  120 mg Oral Daily Florencia Reasons, MD   120 mg at 07/10/16 1008  . estradiol (ESTRACE) vaginal cream 2 g  2 g Vaginal Once per day on Wed Sat Florencia Reasons, MD   2 g at 07/10/16 1011  . ezetimibe-simvastatin (VYTORIN) 10-40 MG per tablet 1 tablet  1 tablet Oral Daily Florencia Reasons, MD   1 tablet at 07/10/16 1008  . furosemide (LASIX) tablet 40 mg  40 mg Oral BID Minus Breeding, MD   40 mg at 07/10/16 0817  . levothyroxine (SYNTHROID, LEVOTHROID) tablet 50 mcg  50 mcg Oral QAC breakfast Florencia Reasons, MD   50 mcg at 07/10/16 0817  . mometasone-formoterol (DULERA) 100-5 MCG/ACT inhaler 2 puff  2 puff Inhalation BID Florencia Reasons, MD   2 puff at 07/10/16 1002  . morphine 2 MG/ML injection 2 mg  2 mg Intravenous Q30 min PRN Lacretia Leigh, MD   2 mg at 07/06/16 1508  . pantoprazole (PROTONIX) EC tablet 40 mg  40 mg Oral Daily Florencia Reasons, MD  40 mg at 07/10/16 1007  . polyethylene glycol (MIRALAX / GLYCOLAX) packet 17 g  17 g Oral Daily Berton Mount, RPH   17 g at 07/10/16 1009  . senna-docusate (Senokot-S) tablet 2 tablet  2 tablet Oral BID Florencia Reasons, MD   2 tablet at 07/10/16 1009  . sertraline (ZOLOFT) tablet 75 mg  75 mg Oral Daily Florencia Reasons, MD   75 mg at 07/10/16 1008  . sodium chloride flush (NS) 0.9 % injection 3 mL  3 mL Intravenous Q12H Florencia Reasons, MD   3 mL at 07/10/16 1012  . timolol (TIMOPTIC) 0.5 % ophthalmic solution 1 drop  1 drop Both Eyes BID Florencia Reasons, MD   1 drop at 07/10/16 1012  . trimethoprim (TRIMPEX) tablet 100 mg  100 mg Oral Q48H Florencia Reasons, MD   100 mg at 07/10/16 1008    PE: General exam: Appears comfortable, on [place] 6L/min HEENT: PERRLA, oral mucosa moist, no sclera icterus or thrush Respiratory system: Clear to auscultation. Decreased breath  sounds at bases Cardiovascular system: S1 & S2 heard, irregularly irregular. No murmurs  Gastrointestinal system: Abdomen soft, non-tender, nondistended. Normal bowel sound. No organomegaly Central nervous system: Alert and oriented. No focal neurological deficits. Extremities: No cyanosis, clubbing or edema Skin: No rashes or ulcers Psychiatry: Mood & affect appropriate.   Lab Results:   Recent Labs  07/08/16 0435  WBC 7.9  HGB 13.3  HCT 41.8  PLT 177   BMET  Recent Labs  07/08/16 0435 07/09/16 0823  NA 141 140  K 5.1 3.7  CL 101 98*  CO2 35* 34*  GLUCOSE 116* 131*  BUN 19 22*  CREATININE 1.17* 0.93  CALCIUM 9.0 8.9     Assessment/Plan  Principal Problem:   Acute diastolic heart failure (HCC) Active Problems:   Essential hypertension   Atrial fibrillation (HCC)   COPD, Gold grade A   Acute on chronic respiratory failure with hypoxia (HCC)   Moderate to severe pulmonary hypertension (HCC)   Hypoxia   Chronic obstructive pulmonary disease (HCC)   Pleural effusion   1. Acute on Chronic Respiratory Failure: multifactorial, in the setting of acute on chronic HF, COPD, moderate to severe pulmonary HTN, ? Unresolved CAP. BNP was abnormal at 827.  Still elevated today.   CXR and Chest CT showed small bilateral pleural effusions. She remains on 6L [place] (baseline supplemental O2 requirement is 4-5L ). However, she is not massively volume overloaded. Continue lasix for dHF. She was transitioned to PO Lasix yesterday, 40 mg daily. Still with good UOP. -1L out yesterday. Renal function and K both stable. Continue strict I/Os, daily weights, low sodium diet. IM to management COPD.  2. Chronic Atrial Fibrillation: HR is well controlled with Cardizem and digoxin. This patients CHA2DS2-VASc Score and unadjusted Ischemic Stroke Rate (% per year) is equal to 11.2 % stroke rate/year from a score of 7 (CHF, Age >75 (2), prior CVA (2), HTN + female).  Above score calculated as 1  point each if present [CHF, HTN, DM, Vascular=MI/PAD/Aortic Plaque, Age if 65-74, or Female] Above score calculated as 2 points each if present [Age > 75, or Stroke/TIA/TE]. - continue Eliquis for a/c.   3. Bradycardia: patient observered to have episodic slow atrial fibrillation / bradycardia with HR dropping as low as the 30s-40s. She recalls having occasional dizziness at home. She is on 120 mg of Cardizem + 125 mcg of digoxin daily. Will stop dig.  LOS: 3 days    Brittainy M. Ladoris Gene 07/10/2016 10:23 AM   History and all data above reviewed.  Patient examined.  I agree with the findings as above.  The patient exam reveals [id number]  ,  Lungs: Decreased breath sounds  ,  Abd: Positive bowel sounds, no rebound no guarding, Ext No edema  .  All available labs, radiology testing, previous records reviewed. Agree with documented assessment and plan. I read the pulmonary note and plans for continued supportive care and Hopsice.  She is not in distress.  I see that her BNP is still elevated but this does not help guide therapy significantly.  I will go ahead for now and continue the PO Lasix at BID this increased does for 3 or 4 days.  However, I don't think more aggressive diuresis will lead to substantial improvement.  I will stop her digoxin secondary to bradycardia.    Jeneen Rinks Shauntee Karp  12:26 PM  07/10/2016

## 2016-07-10 NOTE — Care Management Important Message (Signed)
Important Message  Patient Details  Name: Kristine Simmons MRN: [id number] Date of Birth: [birth year]   Medicare Important Message Given:  Yes    Camillo Flaming 07/10/2016, 10:19 AMImportant Message  Patient Details  Name: Kristine Simmons MRN: [id number] Date of Birth: [birth year]   Medicare Important Message Given:  Yes    Camillo Flaming 07/10/2016, 10:18 AMImportant Message  Patient Details  Name: Kristine Simmons MRN: [id number] Date of Birth: 1927-11-10   Medicare Important Message Given:  Yes    Camillo Flaming 07/10/2016, 10:18 AM

## 2016-07-10 NOTE — Telephone Encounter (Signed)
Hospice called and wanted to know if Dr Quay Burow will be attending of records after shes released from the hospital. Also does she want to use hospice standing orders? Please follow up thanks.

## 2016-07-10 NOTE — Progress Notes (Signed)
Spoke with pt, husband and daughter at bedside concerning home with Hospice. [facility] was selected and referral given to in house rep.

## 2016-07-10 NOTE — Progress Notes (Signed)
PROGRESS NOTE    Kristine Simmons  [id number] DOB: 1927/08/01 DOA: 07/06/2016  PCP: Binnie Rail, MD   Brief Narrative:  Kristine Simmons is a 82 y.o. female who has oxygen dependent copd (4 L at rest at baseline), chronic afib on anticoagulation with apixaban, cva with left eye blindness,  diastolic chf, h/o aspiration pneumonia who brought to the ED due to fall with left rib pain.  ED course: cxr did not showed rib fracture,but she is found to be hypoxic, 02 sats 76 on 4liter oxygen. ua unremarkable, labs with elevated bnp at 827, otherwise unremarkable at baseline. he was given lasix, hospitalist called to admit the patient.   Patient admitted to ongoing cough after she completed treatment for pneumonia. CT scan shows no pneumonia but pulmonary edema with pleural effusions. She admits to having a very dry mouth and constantly drinking water.   Pulse ox recently was 85% on 4 L and 94% on 5 L at the pulmonary office.   Subjective: No new complaints today. No acute issues overnight  Assessment & Plan:   Principal Problem:   Acute diastolic heart failure Acute on chronic respiratory failure with hypoxia  Recent aspiration pneumonia - diuretics per cardiology -  oral Lasix   - advised not to drink more than 1 L of water a day - pulse ox has NOT improved with diuresis- currently requiring 6.5 L O2 to keep pulse ox at 85%  Active Problems:    COPD, Gold stage A   Moderate to severe pulmonary hypertension   Chronic respiratory failure - on chronic O2, Dulera, Nebs PRN - Protonix recently started on 7/12 by pulmonary - have asked for pulmonary consult- may ultimately need hospice     Essential hypertension - cont home meds    Atrial fibrillation  - cont Apixaban, Dig and Cardizem   Hypothyroid Synthroid  DVT prophylaxis: Apixaban Code Status: do not intubate Family Communication: husband Disposition Plan: home in 1-2 days Consultants:   Cardiology   Pulmonary     Procedures:   None  Antimicrobials:  Anti-infectives    Start     Dose/Rate Route Frequency Ordered Stop   07/08/16 1000  trimethoprim (TRIMPEX) tablet 100 mg     100 mg Oral Every 48 hours 07/06/16 1611       Objective: Filed Vitals:   07/10/16 0603 07/10/16 0608 07/10/16 1002 07/10/16 1500  BP: 98/50   99/50  Pulse: 56  61 65  Temp: 97.5 F (36.4 C)   97.6 F (36.4 C)  TempSrc: Oral   Oral  Resp: 18  16 16   Height:      Weight:  53.252 kg (117 lb 6.4 oz)    SpO2: 94%  92% 93%    Intake/Output Summary (Last 24 hours) at 07/10/16 1720 Last data filed at 07/10/16 1300  Gross per 24 hour  Intake    360 ml  Output    750 ml  Net   -390 ml   Filed Weights   07/08/16 0550 07/09/16 0605 07/10/16 0608  Weight: 53.661 kg (118 lb 4.8 oz) 53.252 kg (117 lb 6.4 oz) 53.252 kg (117 lb 6.4 oz)    Examination: General exam: Appears comfortable , Alert and awake HEENT: PERRLA, oral mucosa moist, no sclera icterus or thrush Respiratory system: Clear to auscultation. Decreased breath sounds at bases Cardiovascular system: S1 & S2 heard, RRR.  No murmurs  Gastrointestinal system: Abdomen soft, non-tender, nondistended. Normal bowel sound. No organomegaly  Central nervous system: Alert and oriented. No focal neurological deficits. Extremities: No cyanosis, clubbing or edema Skin: No rashes or ulcers Psychiatry:  Mood & affect appropriate.     Data Reviewed: I have personally reviewed following labs and imaging studies  CBC:  Recent Labs Lab 07/06/16 1315 07/08/16 0435  WBC 7.8 7.9  NEUTROABS 5.4  --   HGB 14.3 13.3  HCT 44.3 41.8  MCV 84.2 84.6  PLT 183 [id number]   Basic Metabolic Panel:  Recent Labs Lab 07/06/16 1315 07/07/16 0446 07/08/16 0435 07/09/16 0823  NA 139 140 141 140  K 3.6 3.6 5.1 3.7  CL 99* 98* 101 98*  CO2 30 32 35* 34*  GLUCOSE 115* 99 116* 131*  BUN 21* 19 19 22*  CREATININE 1.11* 1.01* 1.17* 0.93  CALCIUM 9.1 8.6* 9.0 8.9  MG  --  1.8   --   --    GFR: Estimated Creatinine Clearance: 35.9 mL/min (by C-G formula based on Cr of 0.93). Liver Function Tests:  Recent Labs Lab 07/06/16 1315  AST 41  ALT 23  ALKPHOS 47  BILITOT 1.2  PROT 6.9  ALBUMIN 4.0   No results for input(s): LIPASE, AMYLASE in the last 168 hours. No results for input(s): AMMONIA in the last 168 hours. Coagulation Profile: No results for input(s): INR, PROTIME in the last 168 hours. Cardiac Enzymes:  Recent Labs Lab 07/06/16 1315  TROPONINI 0.04*   BNP (last 3 results) No results for input(s): PROBNP in the last 8760 hours. HbA1C: No results for input(s): HGBA1C in the last 72 hours. CBG: No results for input(s): GLUCAP in the last 168 hours. Lipid Profile: No results for input(s): CHOL, HDL, LDLCALC, TRIG, CHOLHDL, LDLDIRECT in the last 72 hours. Thyroid Function Tests: No results for input(s): TSH, T4TOTAL, FREET4, T3FREE, THYROIDAB in the last 72 hours. Anemia Panel: No results for input(s): VITAMINB12, FOLATE, FERRITIN, TIBC, IRON, RETICCTPCT in the last 72 hours. Urine analysis:    Component Value Date/Time   COLORURINE YELLOW 07/06/2016 [place] 07/06/2016 1237   LABSPEC 1.008 07/06/2016 1237   PHURINE 7.0 07/06/2016 1237   GLUCOSEU NEGATIVE 07/06/2016 1237   GLUCOSEU NEGATIVE 05/26/2015 1507   HGBUR NEGATIVE 07/06/2016 1237   HGBUR moderate 10/05/2010 0825   BILIRUBINUR NEGATIVE 07/06/2016 1237   KETONESUR NEGATIVE 07/06/2016 1237   PROTEINUR NEGATIVE 07/06/2016 1237   UROBILINOGEN 1.0 11/03/2015 0657   NITRITE NEGATIVE 07/06/2016 1237   LEUKOCYTESUR NEGATIVE 07/06/2016 1237   Sepsis Labs: @LABRCNTIP (procalcitonin:4,lacticidven:4) ) Recent Results (from the past 240 hour(s))  Urine culture     Status: None   Collection Time: 07/06/16 12:37 PM  Result Value Ref Range Status   Specimen Description URINE, CLEAN CATCH  Final   Special Requests NONE  Final   Culture NO GROWTH Performed at [facility]   Final   Report Status 07/08/2016 FINAL  Final         Radiology Studies: Dg Chest Port 1 View  07/09/2016  CLINICAL DATA:  Follow-up pleural effusion. History of emphysema, hypertension and hyperlipidemia. EXAM: PORTABLE CHEST 1 VIEW COMPARISON:  Chest radiograph July 06, 2016 FINDINGS: The cardiac silhouette is mildly enlarged and unchanged. Calcified aortic knob. Calcified hilar lymph nodes. Small LEFT pleural effusion. Increased lung volumes with chronic bronchitic changes. Decreased pulmonary vascular congestion and resolution of RIGHT pleural effusion on frontal radiograph. No pneumothorax. Status post LEFT shoulder arthroplasty. Osteopenia. IMPRESSION: Decreased vascular congestion. Mild cardiomegaly and small residual LEFT  pleural effusion. COPD. Electronically Signed   By: Elon Alas M.D.   On: 07/09/2016 12:03      Scheduled Meds: . acidophilus  1 capsule Oral Daily  . antiseptic oral rinse  7 mL Mouth Rinse BID  . apixaban  2.5 mg Oral BID  . apraclonidine  1 drop Right Eye Q12H  . calcium-vitamin D  2 tablet Oral Q breakfast  . chlorhexidine  15 mL Mouth/Throat QID  . diltiazem  120 mg Oral Daily  . estradiol  2 g Vaginal Once per day on Wed Sat  . ezetimibe-simvastatin  1 tablet Oral Daily  . furosemide  40 mg Oral BID  . levothyroxine  50 mcg Oral QAC breakfast  . mometasone-formoterol  2 puff Inhalation BID  . pantoprazole  40 mg Oral Daily  . polyethylene glycol  17 g Oral Daily  . senna-docusate  2 tablet Oral BID  . sertraline  75 mg Oral Daily  . sodium chloride flush  3 mL Intravenous Q12H  . timolol  1 drop Both Eyes BID  . trimethoprim  100 mg Oral Q48H   Continuous Infusions: . sodium chloride 10 mL/hr (07/06/16 1739)     LOS: 3 days    Time spent in minutes: 35     Baili Stang, Linward Foster, MD [facility] Pager: [email or web address] Password [email or web address] 07/10/2016, 5:20 PM

## 2016-07-11 ENCOUNTER — Telehealth: Payer: Self-pay | Admitting: Physician Assistant

## 2016-07-11 ENCOUNTER — Telehealth: Payer: Self-pay | Admitting: Pulmonary Disease

## 2016-07-11 ENCOUNTER — Encounter: Payer: Self-pay | Admitting: Physician Assistant

## 2016-07-11 MED ORDER — FUROSEMIDE 40 MG PO TABS: 40.0000 mg | ORAL_TABLET | Freq: Every day | ORAL | Status: DC

## 2016-07-11 MED ORDER — FUROSEMIDE 40 MG PO TABS: 40.0000 mg | ORAL_TABLET | Freq: Two times a day (BID) | ORAL | Status: DC

## 2016-07-11 MED ORDER — POTASSIUM CHLORIDE ER 20 MEQ PO TBCR: 20.0000 meq | EXTENDED_RELEASE_TABLET | Freq: Every day | ORAL | Status: DC

## 2016-07-11 NOTE — Telephone Encounter (Signed)
Please advise 

## 2016-07-11 NOTE — Telephone Encounter (Signed)
Advised amy with hospice ok for hospice standing orders and dr burns will be attending at patient discharge

## 2016-07-11 NOTE — Evaluation (Signed)
Physical Therapy Evaluation Patient Details Name: Kristine Simmons MRN: [id number] DOB: September 06, 1927 Today's Date: 07/11/2016   History of Present Illness  82 yo  with H/o oxygen dependent copd (4-6 liter at baseline), chronic afib on anticoagulation with apixaban, h/o cva with left eye blindness, h/o diastolic chf, h/o aspiration pneumonia who brought to the ED due to fall with left rib pain. Palliative Care has consulted. Plans for DC home with Hospice.  Clinical Impression  The patient was pleased to ambulated. Noted that she is to DC with Hospice. No further PT at thios time. Patient is ambulatory with 1 assist for safety.    Follow Up Recommendations No PT follow up    Equipment Recommendations  None recommended by PT    Recommendations for Other Services       Precautions / Restrictions Precautions Precaution Comments: on 6 liters [place]      Mobility  Bed Mobility Overal bed mobility: Independent                Transfers Overall transfer level: Needs assistance   Transfers: Sit to/from Stand;Stand Pivot Transfers Sit to Stand: Min assist Stand pivot transfers: Min assist          Ambulation/Gait Ambulation/Gait assistance: Min assist Ambulation Distance (Feet): 100 Feet (then 300 ) Assistive device: Rolling walker (2 wheeled) Gait Pattern/deviations: Step-through pattern     General Gait Details: on  6 liter, lowest sat 87%  Stairs            Wheelchair Mobility    Modified Rankin (Stroke Patients Only)       Balance                                             Pertinent Vitals/Pain Pain Assessment: No/denies pain    Home Living Family/patient expects to be discharged to:: Private residence Living Arrangements: Spouse/significant other Available Help at Discharge: Family Type of Home: House Home Access: Stairs to enter Entrance Stairs-Rails: None Entrance Stairs-Number of Steps: 1 + 1 Home Layout: One level Home  Equipment: Environmental consultant - 2 wheels;Cane - single point;Walker - 4 wheels;Bedside commode;Shower seat      Prior Function Level of Independence: Needs assistance   Gait / Transfers Assistance Needed: holds onto objects and/or spouse's arm. Uses SPC at times.   ADL's / Homemaking Assistance Needed: spouse assists due to vision  Comments: Multiple falls at home     Hand Dominance        Extremity/Trunk Assessment   Upper Extremity Assessment: Generalized weakness           Lower Extremity Assessment: Generalized weakness         Communication   Communication: No difficulties  Cognition Arousal/Alertness: Awake/alert Behavior During Therapy: WFL for tasks assessed/performed Overall Cognitive Status: Within Functional Limits for tasks assessed                      General Comments      Exercises        Assessment/Plan    PT Assessment Patent does not need any further PT services  PT Diagnosis Generalized weakness   PT Problem List    PT Treatment Interventions     PT Goals (Current goals can be found in the Care Plan section) Acute Rehab PT Goals Patient Stated Goal: to go home PT Goal  Formulation: All assessment and education complete, DC therapy    Frequency     Barriers to discharge        Co-evaluation               End of Session   Activity Tolerance: Patient tolerated treatment well Patient left: in chair;with call bell/phone within reach;with chair alarm set Nurse Communication: Mobility status         Time: [id number] PT Time Calculation (min) (ACUTE ONLY): 23 min   Charges:   PT Evaluation $PT Eval Low Complexity: 1 Procedure PT Treatments $Gait Training: 8-22 mins   PT G Codes:        Claretha Cooper 07/11/2016, 12:52 PM Tresa Endo PT [phone]

## 2016-07-11 NOTE — Telephone Encounter (Signed)
Please call [phone] Kristine Simmons. Please return call or respond thru epic if allowed to. Pt is going to discharge today.

## 2016-07-11 NOTE — Telephone Encounter (Signed)
New message      TCM appt on 07-18-16 with Almyra Deforest, per [facility]

## 2016-07-11 NOTE — Progress Notes (Signed)
Patient had a 2.11 pause. HR dropped to 36 at time of pause. Pt resting comfortably. VSS. NP on call notified. Will continue to monitor closely.

## 2016-07-11 NOTE — Telephone Encounter (Signed)
Called # provided and referral hrs are until 5pm. WCB in AM

## 2016-07-11 NOTE — Telephone Encounter (Signed)
Attempted to call hospice and the line is ringing fast busy.  Will try back later

## 2016-07-11 NOTE — Telephone Encounter (Signed)
yes

## 2016-07-11 NOTE — Telephone Encounter (Signed)
Yes and standing orders are ok

## 2016-07-11 NOTE — Telephone Encounter (Signed)
[place] and spoke with Amy, states that the patient is requesting Dr Lake Bells to be her attending physician. Please advise Dr Lake Bells. Thanks.

## 2016-07-11 NOTE — Consult Note (Signed)
Consultation Note Date: 07/11/2016   Patient Name: Kristine Simmons  DOB: 1927/01/07  MRN: [id number]  Age / Sex: 82 y.o., female  PCP: Binnie Rail, MD Referring Physician: Velvet Bathe, MD  Reason for Consultation: Establishing goals of care  HPI/Patient Profile: 82 y.o. female  with past medical history of chronic atrial fibrillation and chronic respiratory failure secondary to emphysema, severe secondary pulmonary artery hypertension and chronic aspiration admitted on 07/06/2016 with acute on chronic hypoxic respiratory failure in setting of decompensated diastolic dysfxn and cor pulmonale.   Clinical Assessment and Goals of Care: Met with Patient, her husband, and her 2 daughters. I had met with family in November of last year.  Patient reports today that the most important thing to her being able to go back to her home.  She reports today that she has good understanding of her condition and she had discussed at length with Marni Griffon from pulmonary service.  We reviewed discussion we had in November that the hospital can be useful as long as she is getting well enough from care she receives at the hospital to enjoy her time at home, but we have reached a point where she will be better served to plan on bringing care to her rather than repeated trips to the hospital. We discussed hospice as a tool that may be beneficial in this as she has reached a point where we are trying to fix problems that are not fixable.  Both she and her family are in agreement that she would best be served by working transition home with the support of hospice on discharge.  SUMMARY OF RECOMMENDATIONS   - DNR/DO NOT INTUBATE - Plan to transition home with support of home hospice - I placed referral to care management to present options for home hospice to family - She will need oximizer and concentrator with 10L capability prior  to discharge. On discharge, would recommend scripts for: - Morphine Concentrate 34m/0.5ml: 544m(0.2526msublingual every 1 hour as needed for pain or shortness of breath: Disp 41m100mLorazepam 2mg/108mconcentrated solution: 1mg (58mml) s46mingual every 4 hours as needed for anxiety: Disp 41ml - 107mol 2mg/ml s99mtion: 0.5mg (0.2518m subl90mal every 4 hours as needed for agitation or nausea: Disp 41ml   Code57mtus/Advance Care Planning:  DNR    Symptom Management:   Dyspnea: Continue morphine as needed. Would transition to oral morphine concentrate on discharge  Palliative Prophylaxis:   Aspiration, Bowel Regimen, Delirium Protocol and Frequent Pain Assessment  Additional Recommendations (Limitations, Scope, Preferences):  Avoid Hospitalization  Psycho-social/Spiritual:   Desire for further Chaplaincy support:no  Additional Recommendations: Education on Hospice  Prognosis:   < 6 months  Discharge Planning: Home with Hospice      Primary Diagnoses: Present on Admission:  . (Resolved) Dyspnea . (Resolved) Hypoxia . Acute diastolic heart failure (HCC) . [place] Acute on chronic respiratory failure (HCC) . [place] chronic respiratory failure with hypoxia (HCC) . [place] (HCC) . [place] hypertension . COPD, Gold grade A .  Moderate to severe pulmonary hypertension ([place]) . Hypoxia  I have reviewed the medical record, interviewed the patient and family, and examined the patient. The following aspects are pertinent.  Past Medical History  Diagnosis Date  . Hx of colonic polyps     last colonoscopy 2005, repeat was due 2010 Dr. Sharlett Iles  . Atrial fibrillation ([place])     Dr Percival Spanish  . Fracture 1995, 2009    RUE; LUE Dr. Durward Fortes  . 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 Cystitis   . Complication of anesthesia     difficulty with arousal post op  . Hyperlipidemia   . Osteoporosis   . Cerebrovascular accident ([place]) 01/09    . Hypothyroidism   . Spinal stenosis of lumbar region 01/10/2016  . Acute diastolic heart failure ([place]) 11/03/2015  . Aortic stenosis, mild 08/15/2014    Echo June, 2015, mild aortic stenosis   . Cardiorenal syndrome 11/05/2015  . Chronic anticoagulation 08/15/2014    Eliquis for A fib   . COPD, Gold grade A 04/08/2008    January 2016 pulmonary function testing> ratio 58% FEV1 1.83L (88% pred, no change with BD), TLC 5.78L (103% pred), DLCO 9.01 (31% pred) 07/2015 ONO RA > spO2 79% average 07/2015 ONO 2LPM> < 88% 189 min 08/2015 ONO 3LPM < 88% 69.8 min 09/2015 ONO 4LPM > OK   . CAROTID STENOSIS 08/08/2009    Qualifier: Diagnosis of  By: Percival Spanish, MD, Farrel Gordon    . Depression 09/22/2014  . G E R D 06/02/2007    Qualifier: Diagnosis of  By: Linna Darner MD, William    . HYPERTENSION, ESSENTIAL NOS 12/01/2007    Qualifier: Diagnosis of  By: Linna Darner MD, Gwyndolyn Saxon    . T12 compression fracture ([place]) 11/02/2015  . Squamous cell carcinoma of neck ([place]) 03/09/2014    3/15 Dr Wilhemina Bonito    Social History   Social History  . Marital Status: Married    Spouse Name: N/A  . Number of Children: N/A  . Years of Education: N/A   Social History Main Topics  . Smoking status: Former Smoker -- 1.00 packs/day for 45 years    Types: Cigarettes    Quit date: 12/23/1993  . Smokeless tobacco: Never Used     Comment: Smoked 1950-1995, up to 1 ppd  . Alcohol Use: No  . Drug Use: No  . Sexual Activity: No   Other Topics Concern  . None   Social History Narrative   Family History  Problem Relation Age of Onset  . Stroke Father     > 46  . Diabetes Father   . Leukemia Mother   . Cancer Mother     leukemia  . Colon cancer Brother     Valve replacement  . Heart attack Brother   . Breast cancer Sister   . Ovarian cancer Sister   . Lung cancer Sister     smoker  . Prostate cancer Brother   . Cancer Brother     bladder   Scheduled Meds: . acidophilus  1 capsule Oral Daily  . antiseptic oral rinse  7 mL  Mouth Rinse BID  . apixaban  2.5 mg Oral BID  . apraclonidine  1 drop Right Eye Q12H  . calcium-vitamin D  2 tablet Oral Q breakfast  . chlorhexidine  15 mL Mouth/Throat QID  . diltiazem  120 mg Oral Daily  . estradiol  2 g Vaginal Once per day  on Wed Sat  . ezetimibe-simvastatin  1 tablet Oral Daily  . furosemide  40 mg Oral BID  . levothyroxine  50 mcg Oral QAC breakfast  . mometasone-formoterol  2 puff Inhalation BID  . pantoprazole  40 mg Oral Daily  . polyethylene glycol  17 g Oral Daily  . senna-docusate  2 tablet Oral BID  . sertraline  75 mg Oral Daily  . sodium chloride flush  3 mL Intravenous Q12H  . timolol  1 drop Both Eyes BID  . trimethoprim  100 mg Oral Q48H   Continuous Infusions: . sodium chloride 10 mL/hr (07/06/16 1739)   PRN Meds:.albuterol, morphine injection Medications Prior to Admission:  Prior to Admission medications   Medication Sig Start Date End Date Taking? Authorizing Provider  albuterol (VENTOLIN HFA) 108 (90 BASE) MCG/ACT inhaler Inhale 2 puffs into the lungs every 4 (four) hours as needed for wheezing or shortness of breath.    Yes Historical Provider, MD  amoxicillin-clavulanate (AUGMENTIN) 875-125 MG tablet Take 1 tablet by mouth 2 (two) times daily. 07/04/16 07/11/16 Yes Magdalen Spatz, NP  apraclonidine (IOPIDINE) 0.5 % ophthalmic solution Place 1 drop into the right eye every 12 (twelve) hours.   Yes Historical Provider, MD  budesonide-formoterol (SYMBICORT) 80-4.5 MCG/ACT inhaler Inhale 2 puffs into the lungs 2 (two) times daily. 02/02/16  Yes Juanito Doom, MD  calcium-vitamin D (OSCAL WITH D) 500-200 MG-UNIT tablet Take 2 tablets by mouth daily with breakfast.   Yes Historical Provider, MD  [place] 125 MCG tablet TAKE 1 TABLET ONCE DAILY. 05/26/15  Yes Minus Breeding, MD  diltiazem (CARDIZEM CD) 120 MG 24 hr capsule TAKE (1) CAPSULE DAILY. 05/27/16  Yes Minus Breeding, MD  ELIQUIS 2.5 MG TABS tablet TAKE 1 TABLET TWICE DAILY. 05/18/16  Yes Minus Breeding, MD  ENSURE PLUS (ENSURE PLUS) LIQD TAKE ONE CAN BY MOUTH ONCE DAILY 02/13/16  Yes Binnie Rail, MD  estradiol (ESTRACE) 0.1 MG/GM vaginal cream Place 2 g vaginally 2 (two) times a week. On Wednesdays & Saturdays.   Yes Historical Provider, MD  furosemide (LASIX) 40 MG tablet Take 1 tablet (40 mg total) by mouth daily. 01/10/16  Yes Biagio Borg, MD  levothyroxine (SYNTHROID, LEVOTHROID) 50 MCG tablet TAKE 1 TABLET ONCE DAILY. 06/21/16  Yes Binnie Rail, MD  OXYGEN Inhale into the lungs continuous. Sitting or resting flow should be on 4 L  And during activities use 5-6 L   Yes Historical Provider, MD  pantoprazole (PROTONIX) 40 MG tablet Take 1 tablet (40 mg total) by mouth daily. 54mns prior to dinner 07/03/16  Yes SMagdalen Spatz NP  polyethylene glycol powder (GLYCOLAX/MIRALAX) powder Take 17 g by mouth daily. Mix 1 capful of powder and dissolve it in any liquid. Drink once daily. 10/30/15  Yes DForde Dandy MD  Probiotic Product (MISC INTESTINAL FLORA REGULAT) CAPS Take 1 capsule by mouth daily.     Yes Historical Provider, MD  sertraline (ZOLOFT) 25 MG tablet Take 1 tablet (25 mg total) by mouth daily. In addition to 50 mg daily for a total of 75 mg daily 04/04/16  Yes SBinnie Rail MD  sertraline (ZOLOFT) 50 MG tablet Take 1 tablet (50 mg total) by mouth daily. 01/10/16  Yes JBiagio Borg MD  Spacer/Aero-Holding Chambers (AEROCHAMBER MV) inhaler Use as instructed 07/03/16  Yes SMagdalen Spatz NP  timolol (TIMOPTIC) 0.5 % ophthalmic solution Place 1 drop into both eyes 2 (two) times daily.  Yes Historical Provider, MD  trimethoprim (TRIMPEX) 100 MG tablet Take 100 mg by mouth every other day.    Yes Historical Provider, MD  VYTORIN 10-40 MG tablet TAKE 1 TABLET ONCE DAILY. 06/11/16  Yes Binnie Rail, MD   Allergies  Allergen Reactions  . Hydrocodone Nausea And Vomiting  . Desmopressin Acetate     REACTION: swelling feet  . Dorzolamide Hcl Other (See Comments)    Burning in eyes and  redness  . Prempro [Conj Estrog-Medroxyprogest Ace]     unknown  . Ciprofloxacin     REACTION: vaginal infection   Review of Systems  Constitutional: Positive for activity change, appetite change and fatigue.  Respiratory: Positive for cough and shortness of breath.   Gastrointestinal: Positive for nausea.  Musculoskeletal: Positive for back pain.  Psychiatric/Behavioral: Positive for sleep disturbance.    Physical Exam General exam: Appears comfortable , Alert and awake HEENT: PERRLA, oral mucosa moist, no sclera icterus or thrush Respiratory system: Clear to auscultation. Decreased breath sounds at bases Cardiovascular system: S1 & S2 heard, RRR. No murmurs  Gastrointestinal system: Abdomen soft, non-tender, nondistended. Normal bowel sound. No organomegaly Central nervous system: Alert and oriented. No focal neurological deficits. Extremities: No cyanosis, clubbing or edema Skin: No rashes or ulcers Psychiatry: Mood & affect appropriate.   Vital Signs: BP 115/55 mmHg  Pulse 63  Temp(Src) 97.8 F (36.6 C) (Oral)  Resp 16  Ht 5' 7"  (1.702 m)  Wt 51.393 kg (113 lb 4.8 oz)  BMI 17.74 kg/m2  SpO2 93% Pain Assessment: No/denies pain   Pain Score: 0-No pain   SpO2: SpO2: 93 % O2 Device:SpO2: 93 % O2 Flow Rate: .O2 Flow Rate (L/min): 6 L/min  IO: Intake/output summary:  Intake/Output Summary (Last 24 hours) at 07/11/16 0803 Last data filed at 07/11/16 0700  Gross per 24 hour  Intake    480 ml  Output   1450 ml  Net   -970 ml    LBM: Last BM Date: 07/10/16 Baseline Weight: Weight: 54.159 kg (119 lb 6.4 oz) Most recent weight: Weight: 51.393 kg (113 lb 4.8 oz)     Palliative Assessment/Data: 40%   Flowsheet Rows        Most Recent Value   Intake Tab    Referral Department  Critical care   Unit at Time of Referral  Cardiac/Telemetry Unit   Palliative Care Primary Diagnosis  Pulmonary   Date Notified  07/09/16   Palliative Care Type  Return patient  Palliative Care   Reason for referral  Counsel Regarding Hospice, Clarify Goals of Care   Date of Admission  07/06/16   Date first seen by Palliative Care  07/10/16   # of days Palliative referral response time  1 Day(s)   # of days IP prior to Palliative referral  3   Clinical Assessment    Palliative Performance Scale Score  40%   Pain Max last 24 hours  4   Pain Min Last 24 hours  0   Psychosocial & Spiritual Assessment    Palliative Care Outcomes    Patient/Family meeting held?  Yes   Who was at the meeting?  Patient, husband, 2 daughters   Palliative Care Outcomes  Clarified goals of care, Counseled regarding hospice, Transitioned to hospice   Palliative Care follow-up planned  Yes, Home      Time In: 1025 Time Out: 1130 Time Total: 65 Greater than 50%  of this time was spent counseling and coordinating  care related to the above assessment and plan.  Signed by: Micheline Rough, MD   Please contact Palliative Medicine Team phone at [phone] for questions and concerns.  For individual provider: See Shea Evans

## 2016-07-11 NOTE — Telephone Encounter (Signed)
LMTCB for Kristine Simmons

## 2016-07-11 NOTE — Progress Notes (Signed)
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 Seen by palliative care yesterday and plan to discharge home on hospice service.   SUBJECTIVE  No chest pain, sob or palpitations. Still on 6L oxygen.   CURRENT MEDS . acidophilus  1 capsule Oral Daily  . antiseptic oral rinse  7 mL Mouth Rinse BID  . apixaban  2.5 mg Oral BID  . apraclonidine  1 drop Right Eye Q12H  . calcium-vitamin D  2 tablet Oral Q breakfast  . chlorhexidine  15 mL Mouth/Throat QID  . diltiazem  120 mg Oral Daily  . estradiol  2 g Vaginal Once per day on Wed Sat  . ezetimibe-simvastatin  1 tablet Oral Daily  . furosemide  40 mg Oral BID  . levothyroxine  50 mcg Oral QAC breakfast  . mometasone-formoterol  2 puff Inhalation BID  . pantoprazole  40 mg Oral Daily  . polyethylene glycol  17 g Oral Daily  . senna-docusate  2 tablet Oral BID  . sertraline  75 mg Oral Daily  . sodium chloride flush  3 mL Intravenous Q12H  . timolol  1 drop Both Eyes BID  . trimethoprim  100 mg Oral Q48H    OBJECTIVE  Filed Vitals:   07/11/16 0059 07/11/16 0521 07/11/16 0849 07/11/16 0851  BP: 114/64 115/55    Pulse: 64 63    Temp: 97.7 F (36.5 C) 97.8 F (36.6 C)    TempSrc: Oral Oral    Resp: 16 16    Height:      Weight:  113 lb 4.8 oz (51.393 kg)    SpO2: 95% 93% 94% 94%    Intake/Output Summary (Last 24 hours) at 07/11/16 0937 Last data filed at 07/11/16 0915  Gross per 24 hour  Intake    360 ml  Output   1550 ml  Net  -1190 ml   Filed Weights   07/09/16 0605 07/10/16 0608 07/11/16  0521  Weight: 117 lb 6.4 oz (53.252 kg) 117 lb 6.4 oz (53.252 kg) 113 lb 4.8 oz (51.393 kg)    PHYSICAL EXAM  General: Pleasant, frail elderly woman NAD. On 6L [place] Neuro: Alert and oriented X 3. Moves all extremities spontaneously. Psych: Normal affect. HEENT:  Normal  Neck: Supple without bruits or JVD. Lungs:  Resp regular and unlabored. Diminished breath sound at bases Heart: RRR no s3, s4, or murmurs. Abdomen: Soft, non-tender, non-distended, BS + x 4.  Extremities: No clubbing, cyanosis or edema. DP/PT/Radials 2+ and equal bilaterally.  Accessory Clinical Findings  CBC No results for input(s): WBC, NEUTROABS, HGB, HCT, MCV, PLT in the last 72 hours. Basic Metabolic Panel  Recent Labs  07/09/16 0823  NA 140  K 3.7  CL 98*  CO2 34*  GLUCOSE 131*  BUN 22*  CREATININE 0.93  CALCIUM 8.9    TELE  Sinus rhythm at rate in 50s, intermittently goes into high 30s  Radiology/Studies  Dg Chest 2 View  07/03/2016  CLINICAL DATA:  Emphysema.  Aspiration pneumonia EXAM: CHEST  2 VIEW COMPARISON:  06/10/2016 FINDINGS: Severe COPD with pulmonary  hyperinflation and diffuse pulmonary scarring arm. Patchy bibasilar airspace disease similar to the prior study and may represent pneumonia. Small left effusion unchanged. Negative for heart failure or mass. Severe compression fracture lower thoracic spine is unchanged. IMPRESSION: Severe COPD with scarring. Patchy bibasilar airspace disease unchanged most consistent with pneumonia. Electronically Signed   By: Franchot Gallo M.D.   On: 07/03/2016 11:48   Dg Ribs Unilateral W/chest Left  07/06/2016  CLINICAL DATA:  Fall with left rib cage pain. EXAM: LEFT RIBS AND CHEST - 3+ VIEW COMPARISON:  07/03/2016 FINDINGS: Hyperexpansion is consistent with emphysema. Interstitial markings are diffusely coarsened with chronic features. Bibasilar atelectasis or infiltrate with small bilateral pleural effusions, new in the interval. Radio-opaque marker has  been placed on the skin at the site of patient concern. Oblique views of the left ribs show no evidence for an acute displaced left-sided rib fracture. Patient is status post left shoulder replacement. IMPRESSION: Emphysema with bibasilar atelectasis or infiltrate and small bilateral pleural effusions. No evidence for displaced acute left-sided rib fracture. Electronically Signed   By: Misty Stanley M.D.   On: 07/06/2016 14:07   Ct Chest Wo Contrast  07/06/2016  CLINICAL DATA:  82 year old female with a history of fall and rib pain EXAM: CT CHEST WITHOUT CONTRAST TECHNIQUE: Multidetector CT imaging of the chest was performed following the standard protocol without IV contrast. COMPARISON:  Chest x-ray 07/06/2016, prior CT 04/08/2015, thoracic plain film 11/23/2015 FINDINGS: Chest: Chest Wall: Superficial soft tissues unremarkable. No axillary or supraclavicular adenopathy. Thoracic Inlet: Unremarkable appearance of the thoracic inlet. Unremarkable thyroid. Airways:  No endotracheal debris. No bronchial wall thickening. Mediastinum: Borderline enlarged lymph nodes of the mediastinum, similar configuration to the comparison CT study, largest in the lowest peritracheal measures 13 mm, unchanged from prior. Calcified left hilar lymph nodes. No hiatal hernia. Heart: Borderline enlarged heart. Calcifications of left main, left anterior descending, circumflex, right coronary artery. Calcifications of the aortic valve. No pericardial fluid/thickening. Vasculature: Vasculature not well evaluated given the absence of intravenous contrast. No periaortic fluid. No aneurysm. Dense calcifications of the ascending aorta, transverse arch, descending thoracic aorta. No periaortic fluid. Calcifications of the branch vessels. Lungs: Advanced paraseptal and centrilobular emphysema. Small moderate bilateral pleural effusions. Interlobular septal thickening with thickening of the bilateral a fissures. Trace atelectasis bilaterally.  Sub solid nodule measuring 9 mm of the right upper lobe, new from the comparison CT of 1 year prior No endotracheal debris. Upper abdomen: Calcifications of the spleen and liver parenchyma. Musculoskeletal: No displaced fracture. Surgical changes of the left shoulder incompletely imaged. T12 compression fracture present on prior imaging, similar in configuration. Multilevel degenerative changes of the thoracic spine. IMPRESSION: No displaced fracture identified. Advanced paraseptal and centrilobular emphysema with evidence of superimposed edema. Associated small to moderate pleural effusions. Sub solid nodule of the right upper lobe measures 9 mm. Guidelines for follow-up include initial follow-up noncontrast CT in 3 months. Aortic atherosclerosis with associated left main and 3 vessel coronary artery disease. Signed, Dulcy Fanny. Earleen Newport, DO [facility] Electronically Signed   By: Corrie Mckusick D.O.   On: 07/06/2016 16:32   Dg Chest Port 1 View  07/09/2016  CLINICAL DATA:  Follow-up pleural effusion. History of emphysema, hypertension and hyperlipidemia. EXAM: PORTABLE CHEST 1 VIEW COMPARISON:  Chest radiograph July 06, 2016 FINDINGS: The cardiac silhouette is mildly enlarged and unchanged. Calcified aortic knob. Calcified hilar lymph nodes. Small LEFT pleural effusion. Increased lung volumes with chronic bronchitic  changes. Decreased pulmonary vascular congestion and resolution of RIGHT pleural effusion on frontal radiograph. No pneumothorax. Status post LEFT shoulder arthroplasty. Osteopenia. IMPRESSION: Decreased vascular congestion. Mild cardiomegaly and small residual LEFT pleural effusion. COPD. Electronically Signed   By: Elon Alas M.D.   On: 07/09/2016 12:03    ASSESSMENT AND PLAN Principal Problem:   Acute diastolic heart failure (HCC) Active Problems:   Essential hypertension   Atrial fibrillation (HCC)   COPD, Gold grade A   Acute  on chronic respiratory failure with hypoxia (HCC)   Moderate to severe pulmonary hypertension (HCC)   Hypoxia   Chronic obstructive pulmonary disease (HCC)   Pleural effusion    1. Acute on Chronic Respiratory Failure: multifactorial, in the setting of acute on chronic HF, COPD, moderate to severe pulmonary HTN, ? Unresolved CAP. BNP was abnormal at 827. Repeat 713. CXR and Chest CT showed small bilateral pleural effusions. She remains on 6L [place] (baseline supplemental O2 requirement is 4-5L [place]). Seems euvolemic. Net diuresis of negative 2.9L. Weight down 6lb. Renal function and K stable. Continue Lasix 40mg  BID.  IM to management COPD.Consider daily Kdur dose of 69meq. Repeat BEMT in few days.   2. Chronic Atrial Fibrillation: HR ilow. This patients CHA2DS2-VASc Score and unadjusted Ischemic Stroke Rate (% per year) is equal to 11.2 % stroke rate/year from a score of 7 (CHF, Age >75 (2), prior CVA (2), HTN + female).  Above score calculated as 1 point each if present [CHF, HTN, DM, Vascular=MI/PAD/Aortic Plaque, Age if 65-74, or Female] Above score calculated as 2 points each if present [Age > 75, or Stroke/TIA/TE]. - continue Eliquis for a/c.   3. Bradycardia: patient observered to have episodic slow atrial fibrillation / bradycardia with HR dropping as low as the 30s-40s. She recalls having occasional dizziness at home. Discontinued digoxin yesterday. Rate in 50s, however transiently goes in to 30-40s. She is on 120 mg of Cardizem.  Dispo: Seen by Palliative care yesterday and plan to discharge home on hospice service.    Signed, Bhagat,Bhavinkumar PA-C Pager [phone]   History and all data above reviewed.  Patient examined.  I agree with the findings as above.  She ambulated and on 6 liters kept her sats at about 90%.  She felt good.  The patient exam reveals COR:RRR  ,  Lungs: Clear  ,  Abd: Positive bowel sounds, no rebound no guarding, Ext No edema  .  All available labs, radiology  testing, previous records reviewed. Agree with documented assessment and plan. OK to go home.  Send home on BID Lasix for 3 days and the change to once daily.  We will arrange a TOC appt.    Jeneen Rinks Blondie Riggsbee  11:31 AM  07/11/2016

## 2016-07-11 NOTE — Telephone Encounter (Signed)
Called spoke with [facility] with Hospice. She states that the patient is being discharged from the hospital today with hospice. The pt is requesting BQ be the attending of records and Katharine Look states she needs BQ's approval for this and for the hospice care orders. I explained to her that BQ is in the office today and that i would send a message for his approval. She voiced understanding and had no further questions.  BQ please advise

## 2016-07-11 NOTE — Telephone Encounter (Signed)
Yes I will do that.

## 2016-07-11 NOTE — Discharge Summary (Signed)
Physician Discharge Summary  Kristine Simmons [id number] DOB: [birth year] DOA: 07/06/2016  PCP: Binnie Rail, MD  Admit date: 07/06/2016 Discharge date: 07/11/2016  Time spent: > 35 minutes  Recommendations for Outpatient Follow-up:  1. Monitor potassium levels 2. Patient to follow-up with cardiology after discharge 3. Regimen patient home with home hospice   Discharge Diagnoses:  Principal Problem:   Acute diastolic heart failure ([place]) Active Problems:   Essential hypertension   Atrial fibrillation (HCC)   COPD, Gold grade A   Acute on chronic respiratory failure with hypoxia (HCC)   Moderate to severe pulmonary hypertension (HCC)   Hypoxia   Chronic obstructive pulmonary disease (HCC)   Pleural effusion   Discharge Condition: Stable  Diet recommendation: Heart healthy diet  Filed Weights   07/09/16 0605 07/10/16 0608 07/11/16 0521  Weight: 53.252 kg (117 lb 6.4 oz) 53.252 kg (117 lb 6.4 oz) 51.393 kg (113 lb 4.8 oz)    History of present illness:  Patient is a 82 y/o female with h/o chronic atrial fibrillation on Eliquis, chronic diastolic HF (EF of 123456 by echo 10/2015), 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 Seen by palliative care yesterday and plan to discharge home on hospice service.   Hospital Course:   Acute diastolic heart failure Acute on chronic respiratory failure with hypoxia  Recent aspiration pneumonia -Cardiology managed while in house - oral Lasixcardiology recommended Lasix 40 mg by mouth twice a day for the next 3 days then daily dosing.  - Condition improved, patient going home with home hospice  Active Problems:   COPD, Gold stage A  Moderate to severe pulmonary hypertension  Chronic  respiratory failure - on chronic O2, Dulera, Nebs PRN - Protonix recently started on 7/12 by pulmonary   Essential hypertension - cont home meds   Atrial fibrillation  - cont Apixaban, Dig and Cardizem  Hypothyroid Synthroid  Procedures:  None  Consultations:  Palliative  Cardiology  Pulmonary  Discharge Exam: Filed Vitals:   07/11/16 0521 07/11/16 1357  BP: 115/55 102/52  Pulse: 63 52  Temp: 97.8 F (36.6 C) 97.5 F (36.4 C)  Resp: 16     General: Pt in nad, alert and awake Cardiovascular: no cyanosis Respiratory: no increased wob, equal chest rise.   Discharge Instructions   Discharge Instructions    Call MD for:  extreme fatigue    Complete by:  As directed      Call MD for:  temperature >100.4    Complete by:  As directed      Diet - low sodium heart healthy    Complete by:  As directed      Discharge instructions    Complete by:  As directed   Patient discharged home with home hospice. She is to follow-up with cardiology after discharge. Is supposed to be on Lasix with twice a day dosing for the next 3 days including today     Increase activity slowly    Complete by:  As directed           Current Discharge Medication List    CONTINUE these medications which have CHANGED   Details  !! furosemide (LASIX) 40 MG tablet Take 1 tablet (40 mg total) by mouth daily. Qty: 90 tablet, Refills:  3    !! furosemide (LASIX) 40 MG tablet Take 1 tablet (40 mg total) by mouth 2 (two) times daily. Qty: 6 tablet, Refills: 0     !! - Potential duplicate medications found. Please discuss with provider.    CONTINUE these medications which have NOT CHANGED   Details  albuterol (VENTOLIN HFA) 108 (90 BASE) MCG/ACT inhaler Inhale 2 puffs into the lungs every 4 (four) hours as needed for wheezing or shortness of breath.     apraclonidine (IOPIDINE) 0.5 % ophthalmic solution Place 1 drop into the right eye every 12 (twelve) hours.    budesonide-formoterol  (SYMBICORT) 80-4.5 MCG/ACT inhaler Inhale 2 puffs into the lungs 2 (two) times daily. Qty: 1 Inhaler, Refills: 12    calcium-vitamin D (OSCAL WITH D) 500-200 MG-UNIT tablet Take 2 tablets by mouth daily with breakfast.    diltiazem (CARDIZEM CD) 120 MG 24 hr capsule TAKE (1) CAPSULE DAILY. Qty: 90 capsule, Refills: 1    ELIQUIS 2.5 MG TABS tablet TAKE 1 TABLET TWICE DAILY. Qty: 180 tablet, Refills: 0    ENSURE PLUS (ENSURE PLUS) LIQD TAKE ONE CAN BY MOUTH ONCE DAILY Qty: 237 mL, Refills: 2    estradiol (ESTRACE) 0.1 MG/GM vaginal cream Place 2 g vaginally 2 (two) times a week. On Wednesdays & Saturdays.    levothyroxine (SYNTHROID, LEVOTHROID) 50 MCG tablet TAKE 1 TABLET ONCE DAILY. Qty: 90 tablet, Refills: 0    OXYGEN Inhale into the lungs continuous. Sitting or resting flow should be on 4 L  And during activities use 5-6 L    pantoprazole (PROTONIX) 40 MG tablet Take 1 tablet (40 mg total) by mouth daily. 27mins prior to dinner Qty: 30 tablet, Refills: 6    polyethylene glycol powder (GLYCOLAX/MIRALAX) powder Take 17 g by mouth daily. Mix 1 capful of powder and dissolve it in any liquid. Drink once daily. Qty: 255 g, Refills: 0    Probiotic Product (MISC INTESTINAL FLORA REGULAT) CAPS Take 1 capsule by mouth daily.      !! sertraline (ZOLOFT) 25 MG tablet Take 1 tablet (25 mg total) by mouth daily. In addition to 50 mg daily for a total of 75 mg daily Qty: 30 tablet, Refills: 5    !! sertraline (ZOLOFT) 50 MG tablet Take 1 tablet (50 mg total) by mouth daily. Qty: 90 tablet, Refills: 3    Spacer/Aero-Holding Chambers (AEROCHAMBER MV) inhaler Use as instructed Qty: 1 each, Refills: 0    timolol (TIMOPTIC) 0.5 % ophthalmic solution Place 1 drop into both eyes 2 (two) times daily.     trimethoprim (TRIMPEX) 100 MG tablet Take 100 mg by mouth every other day.     VYTORIN 10-40 MG tablet TAKE 1 TABLET ONCE DAILY. Qty: 90 tablet, Refills: 2     !! - Potential duplicate  medications found. Please discuss with provider.    STOP taking these medications     amoxicillin-clavulanate (AUGMENTIN) 875-125 MG tablet      DIGOX 125 MCG tablet        Allergies  Allergen Reactions  . Hydrocodone Nausea And Vomiting  . Desmopressin Acetate     REACTION: swelling feet  . Dorzolamide Hcl Other (See Comments)    Burning in eyes and redness  . Prempro [Conj Estrog-Medroxyprogest Ace]     unknown  . Ciprofloxacin     REACTION: vaginal infection   Follow-up Information    Follow up with Almyra Deforest, PA. Go on 07/18/2016.   Specialties:  Cardiology,  Radiology   Why:  @11 :00 for TCM    Contact information:   [address] [place] [id number] [phone]        The results of significant diagnostics from this hospitalization (including imaging, microbiology, ancillary and laboratory) are listed below for reference.    Significant Diagnostic Studies: Dg Chest 2 View  07/03/2016  CLINICAL DATA:  Emphysema.  Aspiration pneumonia EXAM: CHEST  2 VIEW COMPARISON:  06/10/2016 FINDINGS: Severe COPD with pulmonary hyperinflation and diffuse pulmonary scarring arm. Patchy bibasilar airspace disease similar to the prior study and may represent pneumonia. Small left effusion unchanged. Negative for heart failure or mass. Severe compression fracture lower thoracic spine is unchanged. IMPRESSION: Severe COPD with scarring. Patchy bibasilar airspace disease unchanged most consistent with pneumonia. Electronically Signed   By: Franchot Gallo M.D.   On: 07/03/2016 11:48   Dg Ribs Unilateral W/chest Left  07/06/2016  CLINICAL DATA:  Fall with left rib cage pain. EXAM: LEFT RIBS AND CHEST - 3+ VIEW COMPARISON:  07/03/2016 FINDINGS: Hyperexpansion is consistent with emphysema. Interstitial markings are diffusely coarsened with chronic features. Bibasilar atelectasis or infiltrate with small bilateral pleural effusions, new in the interval. Radio-opaque marker has been  placed on the skin at the site of patient concern. Oblique views of the left ribs show no evidence for an acute displaced left-sided rib fracture. Patient is status post left shoulder replacement. IMPRESSION: Emphysema with bibasilar atelectasis or infiltrate and small bilateral pleural effusions. No evidence for displaced acute left-sided rib fracture. Electronically Signed   By: Misty Stanley M.D.   On: 07/06/2016 14:07   Ct Chest Wo Contrast  07/06/2016  CLINICAL DATA:  82 year old female with a history of fall and rib pain EXAM: CT CHEST WITHOUT CONTRAST TECHNIQUE: Multidetector CT imaging of the chest was performed following the standard protocol without IV contrast. COMPARISON:  Chest x-ray 07/06/2016, prior CT 04/08/2015, thoracic plain film 11/23/2015 FINDINGS: Chest: Chest Wall: Superficial soft tissues unremarkable. No axillary or supraclavicular adenopathy. Thoracic Inlet: Unremarkable appearance of the thoracic inlet. Unremarkable thyroid. Airways:  No endotracheal debris. No bronchial wall thickening. Mediastinum: Borderline enlarged lymph nodes of the mediastinum, similar configuration to the comparison CT study, largest in the lowest peritracheal measures 13 mm, unchanged from prior. Calcified left hilar lymph nodes. No hiatal hernia. Heart: Borderline enlarged heart. Calcifications of left main, left anterior descending, circumflex, right coronary artery. Calcifications of the aortic valve. No pericardial fluid/thickening. Vasculature: Vasculature not well evaluated given the absence of intravenous contrast. No periaortic fluid. No aneurysm. Dense calcifications of the ascending aorta, transverse arch, descending thoracic aorta. No periaortic fluid. Calcifications of the branch vessels. Lungs: Advanced paraseptal and centrilobular emphysema. Small moderate bilateral pleural effusions. Interlobular septal thickening with thickening of the bilateral a fissures. Trace atelectasis bilaterally. Sub  solid nodule measuring 9 mm of the right upper lobe, new from the comparison CT of 1 year prior No endotracheal debris. Upper abdomen: Calcifications of the spleen and liver parenchyma. Musculoskeletal: No displaced fracture. Surgical changes of the left shoulder incompletely imaged. T12 compression fracture present on prior imaging, similar in configuration. Multilevel degenerative changes of the thoracic spine. IMPRESSION: No displaced fracture identified. Advanced paraseptal and centrilobular emphysema with evidence of superimposed edema. Associated small to moderate pleural effusions. Sub solid nodule of the right upper lobe measures 9 mm. Guidelines for follow-up include initial follow-up noncontrast CT in 3 months. Aortic atherosclerosis with associated left main and 3 vessel coronary artery disease. Signed, York Cerise  Pasty Arch, DO [facility] Radiology Electronically Signed   By: Corrie Mckusick D.O.   On: 07/06/2016 16:32   Dg Chest Port 1 View  07/09/2016  CLINICAL DATA:  Follow-up pleural effusion. History of emphysema, hypertension and hyperlipidemia. EXAM: PORTABLE CHEST 1 VIEW COMPARISON:  Chest radiograph July 06, 2016 FINDINGS: The cardiac silhouette is mildly enlarged and unchanged. Calcified aortic knob. Calcified hilar lymph nodes. Small LEFT pleural effusion. Increased lung volumes with chronic bronchitic changes. Decreased pulmonary vascular congestion and resolution of RIGHT pleural effusion on frontal radiograph. No pneumothorax. Status post LEFT shoulder arthroplasty. Osteopenia. IMPRESSION: Decreased vascular congestion. Mild cardiomegaly and small residual LEFT pleural effusion. COPD. Electronically Signed   By: Elon Alas M.D.   On: 07/09/2016 12:03    Microbiology: Recent Results (from the past 240 hour(s))  Urine culture     Status: None   Collection Time: 07/06/16 12:37 PM  Result Value Ref Range Status   Specimen Description  URINE, CLEAN CATCH  Final   Special Requests NONE  Final   Culture NO GROWTH Performed at [facility]   Final   Report Status 07/08/2016 FINAL  Final     Labs: Basic Metabolic Panel:  Recent Labs Lab 07/06/16 1315 07/07/16 0446 07/08/16 0435 07/09/16 0823  NA 139 140 141 140  K 3.6 3.6 5.1 3.7  CL 99* 98* 101 98*  CO2 30 32 35* 34*  GLUCOSE 115* 99 116* 131*  BUN 21* 19 19 22*  CREATININE 1.11* 1.01* 1.17* 0.93  CALCIUM 9.1 8.6* 9.0 8.9  MG  --  1.8  --   --    Liver Function Tests:  Recent Labs Lab 07/06/16 1315  AST 41  ALT 23  ALKPHOS 47  BILITOT 1.2  PROT 6.9  ALBUMIN 4.0   No results for input(s): LIPASE, AMYLASE in the last 168 hours. No results for input(s): AMMONIA in the last 168 hours. CBC:  Recent Labs Lab 07/06/16 1315 07/08/16 0435  WBC 7.8 7.9  NEUTROABS 5.4  --   HGB 14.3 13.3  HCT 44.3 41.8  MCV 84.2 84.6  PLT 183 177   Cardiac Enzymes:  Recent Labs Lab 07/06/16 1315  TROPONINI 0.04*   BNP: BNP (last 3 results)  Recent Labs  06/10/16 1356 07/06/16 1315 07/07/16 0446  BNP 734.1* 827.2* 713.4*    ProBNP (last 3 results) No results for input(s): PROBNP in the last 8760 hours.  CBG: No results for input(s): GLUCAP in the last 168 hours.  Signed:  Velvet Bathe MD.  [facility] 07/11/2016, 4:17 PM

## 2016-07-12 NOTE — Telephone Encounter (Signed)
Called spoke with Kristine Simmons. Informed her of BQ's approval. She voiced understanding and had no further questions.

## 2016-07-12 NOTE — Telephone Encounter (Signed)
Patient contacted regarding discharge from [facility] on 07/11/16  Patient understands to follow up with provider Almyra Deforest, [place] on 07/18/16 at 11 AM at [facility]. Patient understands discharge instructions? yes Patient understands medications and regiment? yes Patient understands to bring all medications to this visit? yes  Pt is requesting prescription for pain medication. Pt advised to contact Dr Billey Gosling, her PCP, with request for pain medication. Pt agreed.

## 2016-07-15 NOTE — Telephone Encounter (Signed)
Will close message.  Refer to message 07/10/16

## 2016-07-16 ENCOUNTER — Telehealth: Payer: Self-pay | Admitting: Pulmonary Disease

## 2016-07-16 DIAGNOSIS — J438 Other emphysema: Secondary | ICD-10-CM

## 2016-07-16 NOTE — Telephone Encounter (Signed)
Called spoke with Tonya at hospice. Reviewed BQ's recs. She voiced understanding and had no further questions. Order has been placed. Nothing further needed.  Order has been placed Nothing further needed.

## 2016-07-16 NOTE — Telephone Encounter (Signed)
Absolutely, please send Rx

## 2016-07-16 NOTE — Telephone Encounter (Signed)
Called spoke with Amy at hospice. She is calling because the pt is requesting a oximyzer. She explained that the pt uses a portable tank that goes up to 5L but she currently is up to 6L at home and her sats range from 89%-94% on 6L. The patient is wanting to be able to attend church and is unable with the portable tank. Amy would like BQ's recs on wether the patient is a candidate for the oximyzer. I explained to her that I would send the message to BQ. She voiced understanding and had no further questions.   BQ please advise

## 2016-07-18 ENCOUNTER — Ambulatory Visit: Payer: Medicare Other | Admitting: Physician Assistant

## 2016-07-24 ENCOUNTER — Ambulatory Visit: Payer: Medicare Other | Admitting: Acute Care

## 2016-07-30 ENCOUNTER — Encounter (INDEPENDENT_AMBULATORY_CARE_PROVIDER_SITE_OTHER): Payer: Self-pay

## 2016-07-30 ENCOUNTER — Ambulatory Visit (INDEPENDENT_AMBULATORY_CARE_PROVIDER_SITE_OTHER): Payer: Medicare Other | Admitting: Physician Assistant

## 2016-07-30 ENCOUNTER — Encounter: Payer: Self-pay | Admitting: Physician Assistant

## 2016-07-30 VITALS — BP 120/68 | HR 89 | Ht 67.0 in | Wt 122.4 lb

## 2016-07-30 DIAGNOSIS — I5032 Chronic diastolic (congestive) heart failure: Secondary | ICD-10-CM | POA: Diagnosis not present

## 2016-07-30 DIAGNOSIS — I10 Essential (primary) hypertension: Secondary | ICD-10-CM | POA: Diagnosis not present

## 2016-07-30 DIAGNOSIS — I482 Chronic atrial fibrillation, unspecified: Secondary | ICD-10-CM

## 2016-07-30 DIAGNOSIS — J449 Chronic obstructive pulmonary disease, unspecified: Secondary | ICD-10-CM

## 2016-07-30 NOTE — Patient Instructions (Addendum)
Medication Instructions:  Your physician recommends that you continue on your current medications as directed. Please refer to the Current Medication list given to you today.   Labwork: None ordered  Testing/Procedures: None ordered  Follow-Up: Your physician recommends that you schedule a follow-up appointment in: 2 MONTHS WITH DR. HOCHREIN   Any Other Special Instructions Will Be Listed Below (If Applicable).     If you need a refill on your cardiac medications before your next appointment, please call your pharmacy.

## 2016-07-30 NOTE — Progress Notes (Signed)
Cardiology Office Note    Date:  07/30/2016   ID:  Kristine Simmons, DOB 1927/06/17, MRN [id number]  PCP:  Binnie Rail, MD  Cardiologist: Dr. Percival Spanish  Chief Complaint  Patient presents with  . Follow-up    History of Present Illness:  Kristine Simmons is a 82 y.o. 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  Patient was admitted to the hospital with acute on chronic diastolic heart failure and acute on chronic respiratory failure with hypoxia and recent aspiration pneumonia.. She diuresed approximately 3 L. She did have some bradycardia with slow atrial fib in 30s and 40s. Digoxin was discontinued and Cardizem 120 mg was continued. She was discharged home on hospice service Because of her severe pulmonary disease.  Patient comes in today accompanied by her husband and daughter. She is feeling better than she has in several years. She is weighing herself daily and it has been stable. She has a little ankle edema. She denies any dizziness, palpitations or presyncope. She has chronic dyspnea and is on oxygen. Her husband says he hasn't seen her feeling this well in the long time.    Past Medical History:  Diagnosis Date  . Acute diastolic heart failure ([place]) 11/03/2015  . Aortic stenosis, mild 08/15/2014   Echo June, 2015, mild aortic stenosis   . Atrial fibrillation ([place])    Dr Percival Spanish  . 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 Cardiorenal syndrome 11/05/2015  . CAROTID STENOSIS 08/08/2009   Qualifier: Diagnosis of  By: Percival Spanish, MD, Farrel Gordon    . Cerebrovascular accident ([place]) 01/09  . Chronic anticoagulation 08/15/2014   Eliquis for A fib   . Complication of anesthesia    difficulty with arousal post op  . COPD, Gold  grade A 04/08/2008   January 2016 pulmonary function testing> ratio 58% FEV1 1.83L (88% pred, no change with BD), TLC 5.78L (103% pred), DLCO 9.01 (31% pred) 07/2015 ONO RA > spO2 79% average 07/2015 ONO 2LPM> < 88% 189 min 08/2015 ONO 3LPM < 88% 69.8 min 09/2015 ONO 4LPM > OK   . Cystitis   . Depression 09/22/2014  . Fracture 1995, 2009   RUE; LUE Dr. Durward Fortes  . G E R D 06/02/2007   Qualifier: Diagnosis of  By: Linna Darner MD, Gwyndolyn Saxon    . Hx of colonic polyps    last colonoscopy 2005, repeat was due 2010 Dr. Sharlett Iles  . Hyperlipidemia   . HYPERTENSION, ESSENTIAL NOS 12/01/2007   Qualifier: Diagnosis of  By: Linna Darner MD, Gwyndolyn Saxon    . Hypothyroidism   . Osteoporosis   . Spinal stenosis of lumbar region 01/10/2016  . Squamous cell carcinoma of neck ([place]) 03/09/2014   3/15 Dr Wilhemina Bonito   . T12 compression fracture ([place]) 11/02/2015    Past Surgical History:  Procedure Laterality Date  . APPENDECTOMY    . CATARACT EXTRACTION     [place]  . COLONOSCOPY W/ POLYPECTOMY  2005   Diverticulosis;Dr. Sharlett Iles  . EXCISION METACARPAL MASS Right 08/17/2013   Procedure: RIGHT LONG EXCISION MASS AND DIP JOINT DEBRIDEMENT;  Surgeon: Tennis Must, MD;  Location: [facility];  Service: Orthopedics;  Laterality: Right;  . FEMORAL HERNIA REPAIR Right 08/17/2014   Procedure: OPEN REPAIR RIGHT  FEMORAL HERNIA  WITH INSERTION OF MESH;  Surgeon: Adin Hector, MD;  Location: [facility];  Service: General;  Laterality: Right;  . INGUINAL HERNIA REPAIR Right 11/15/2014   Procedure: LAPAROSCOPIC RIGHT INGUINAL HERNIA WITH MESH;  Surgeon: Alphonsa Overall, MD;  Location: [facility];  Service: General;  Laterality: Right;  . INSERTION OF MESH  08/17/2014   Procedure: INSERTION OF MESH;  Surgeon: Adin Hector, MD;  Location: [facility];  Service: General;;  . MOHS SURGERY  2013   nasal Basal Cell; Dr Sarajane Jews  . Thyroid Needle Biopsy  2002  . TONSILLECTOMY AND ADENOIDECTOMY    . TOTAL HIP ARTHROPLASTY  2008   CHF & RAF  post op   . TOTAL SHOULDER REPLACEMENT  2010   Dr Durward Fortes - left shoulder  . Vocal Cord Polyps      Current Medications: Outpatient Medications Prior to Visit  Medication Sig Dispense Refill  . albuterol (VENTOLIN HFA) 108 (90 BASE) MCG/ACT inhaler Inhale 2 puffs into the lungs every 4 (four) hours as needed for wheezing or shortness of breath.     Marland Kitchen apixaban (ELIQUIS) 2.5 MG TABS tablet Take 2.5 mg by mouth 2 (two) times daily.    Marland Kitchen apraclonidine (IOPIDINE) 0.5 % ophthalmic solution Place 1 drop into the right eye every 12 (twelve) hours.    . budesonide-formoterol (SYMBICORT) 80-4.5 MCG/ACT inhaler Inhale 2 puffs into the lungs 2 (two) times daily. 1 Inhaler 12  . diltiazem (DILTIAZEM CD) 120 MG 24 hr capsule Take 120 mg by mouth daily.    Marland Kitchen ENSURE PLUS (ENSURE PLUS) LIQD TAKE ONE CAN BY MOUTH ONCE DAILY 237 mL 2  . estradiol (ESTRACE) 0.1 MG/GM vaginal cream Place 2 g vaginally 2 (two) times a week. On Wednesdays & Saturdays.    Marland Kitchen ezetimibe-simvastatin (VYTORIN) 10-40 MG tablet Take 1 tablet by mouth daily.    . furosemide (LASIX) 40 MG tablet Take 1 tablet (40 mg total) by mouth daily. 90 tablet 3  . levothyroxine (SYNTHROID, LEVOTHROID) 50 MCG tablet Take 50 mcg by mouth daily before breakfast.    . OXYGEN Inhale into the lungs continuous. Sitting or resting flow should be on 4 L  And during activities use 5-6 L    . polyethylene glycol powder (GLYCOLAX/MIRALAX) powder Take 17 g by mouth daily. Mix 1 capful of powder and dissolve it in any liquid. Drink once daily. 255 g 0  . Probiotic Product (MISC INTESTINAL FLORA REGULAT) CAPS Take 1 capsule by mouth daily.      . sertraline (ZOLOFT) 25 MG tablet Take 1 tablet (25 mg total) by mouth daily. In addition to 50 mg daily for a total of 75 mg daily 30 tablet 5  . sertraline (ZOLOFT) 50 MG tablet Take 1 tablet (50 mg total) by mouth daily. 90 tablet 3  . Spacer/Aero-Holding Chambers (AEROCHAMBER MV) inhaler Use as instructed 1 each 0  .  timolol (TIMOPTIC) 0.5 % ophthalmic solution Place 1 drop into both eyes 2 (two) times daily.     Marland Kitchen trimethoprim (TRIMPEX) 100 MG tablet Take 100 mg by mouth every other day.     . potassium chloride 20 MEQ TBCR Take 20 mEq by mouth daily. 5 tablet 0  . calcium-vitamin D (OSCAL WITH D) 500-200 MG-UNIT tablet Take 2 tablets by mouth daily with breakfast.    . diltiazem (CARDIZEM CD) 120 MG 24 hr capsule TAKE (1) CAPSULE DAILY. 90 capsule 1  . ELIQUIS 2.5 MG TABS tablet TAKE  1 TABLET TWICE DAILY. 180 tablet 0  . furosemide (LASIX) 40 MG tablet Take 1 tablet (40 mg total) by mouth 2 (two) times daily. 6 tablet 0  . levothyroxine (SYNTHROID, LEVOTHROID) 50 MCG tablet TAKE 1 TABLET ONCE DAILY. 90 tablet 0  . pantoprazole (PROTONIX) 40 MG tablet Take 1 tablet (40 mg total) by mouth daily. 69mins prior to dinner 30 tablet 6  . VYTORIN 10-40 MG tablet TAKE 1 TABLET ONCE DAILY. 90 tablet 2   No facility-administered medications prior to visit.      Allergies:   Hydrocodone; Desmopressin acetate; Dorzolamide hcl; Prempro [conj estrog-medroxyprogest ace]; and Ciprofloxacin   Social History   Social History  . Marital status: Married    Spouse name: N/A  . Number of children: N/A  . Years of education: N/A   Social History Main Topics  . Smoking status: Former Smoker    Packs/day: 1.00    Years: 45.00    Types: Cigarettes    Quit date: 12/23/1993  . Smokeless tobacco: Never Used     Comment: Smoked 1950-1995, up to 1 ppd  . Alcohol use No  . Drug use: No  . Sexual activity: No   Other Topics Concern  . None   Social History Narrative  . None     Family History:  The patient's   family history includes Breast cancer in her sister; Cancer in her brother and mother; Colon cancer in her brother; Diabetes in her father; Heart attack in her brother; Leukemia in her mother; Lung cancer in her sister; Ovarian cancer in her sister; Prostate cancer in her brother; Stroke in her father.   ROS:     Please see the history of present illness.    Review of Systems  Constitution: Positive for weakness and malaise/fatigue.  Cardiovascular: Positive for dyspnea on exertion, irregular heartbeat and leg swelling.  Respiratory: Positive for shortness of breath.   Musculoskeletal: Positive for muscle weakness.   All other systems reviewed and are negative.   PHYSICAL EXAM:   VS:  BP 120/68   Pulse 89   Ht 5\' 7"  (1.702 m)   Wt 122 lb 6.4 oz (55.5 kg)   BMI 19.17 kg/m   Physical Exam  GENElderly, on oxygen  Neck: no JVD, carotid bruits, or masses Cardia Irregular irregular distant heart sounds   Respiratory:Decreased breath sounds throughoutGI: soft, nontender, nondistended, + BS Ext: without cyanosis, clubbing, or edema, Good distal pulses bilaterally MS: no deformity or atrophy  Skin: warm and dry, no rash Psych: euthymic mood, full affect  Wt Readings from Last 3 Encounters:  07/30/16 122 lb 6.4 oz (55.5 kg)  07/11/16 113 lb 4.8 oz (51.4 kg)  07/03/16 122 lb 12.8 oz (55.7 kg)      Studies/Labs Reviewed:    EKG:  EKG is  ordered today.  The ekg ordered today demonstrates Atrial fibrillation at 89 bpm nonspecific ST-T wave changes, no acute change   Recent Labs: 01/10/2016: TSH 3.74 07/06/2016: ALT 23 07/07/2016: B Natriuretic Peptide 713.4; Magnesium 1.8 07/08/2016: Hemoglobin 13.3; Platelets 177 07/09/2016: BUN 22; Creatinine, Ser 0.93; Potassium 3.7; Sodium 140   Lipid Panel    Component Value Date/Time   CHOL 142 01/10/2016 1519   TRIG 95.0 01/10/2016 1519   HDL 51.80 01/10/2016 1519   CHOLHDL 3 01/10/2016 1519   VLDL 19.0 01/10/2016 1519   LDLCALC 71 01/10/2016 1519    Additional studies/ records that were reviewed today include:  2-D echo 11/2016Study Conclusions   -  Left ventricle: The cavity size was normal. Wall thickness was   normal. Systolic function was normal. The estimated ejection   fraction was in the range of 60% to 65%. Wall motion was normal;    there were no regional wall motion abnormalities. - Aortic valve: Moderately to severely calcified annulus. Mildly   thickened, mildly calcified leaflets. Valve area (VTI): 1.48   cm^2. Valve area (Vmax): 1.27 cm^2. Valve area (Vmean): 1.36   cm^2. - Left atrium: The atrium was mildly dilated. - Right ventricle: The cavity size was mildly dilated. Systolic   function was mildly reduced. - Right atrium: The atrium was mildly dilated. - Tricuspid valve: There was moderate regurgitation. - Pulmonary arteries: Systolic pressure was severely increased. PA   peak pressure: 76 mm Hg (S).   Impressions:   - Pt has normal LV function .   she has severe pulmonary hypertension       ASSESSMENT:    1. Chronic atrial fibrillation ([place])   2. Chronic diastolic heart failure ([place])   3. Essential hypertension   4. Chronic obstructive pulmonary disease, unspecified COPD type ([place])      PLAN:  In order of problems listed above:  Chronic atrial fibrillation with better rate control since digoxin stopped. Continue diltiazem 120 mg once daily. Also on  Eliquis 2.5 mg twice a day.  Chronic diastolic heart failure pretty well compensated. She does have a trace of ankle edema today. They weigh her every morning but forgot this morning. Take an extra Lasix as needed for weight gain of 2 or 3 pounds overnight. Follow-up with Dr.Hochrein.  Hypertension well controlled  COPD on home oxygen and hospice.    Medication Adjustments/Labs and Tests Ordered: Current medicines are reviewed at length with the patient today.  Concerns regarding medicines are outlined above.  Medication changes, Labs and Tests ordered today are listed in the Patient Instructions below. Patient Instructions  Medication Instructions:  Your physician recommends that you continue on your current medications as directed. Please refer to the Current Medication list given to you today.   Labwork: None  ordered  Testing/Procedures: None ordered  Follow-Up: Your physician recommends that you schedule a follow-up appointment in: 2 MONTHS WITH DR. HOCHREIN   Any Other Special Instructions Will Be Listed Below (If Applicable).     If you need a refill on your cardiac medications before your next appointment, please call your pharmacy.      Signed, Ermalinda Barrios, PA-C  07/30/2016 3:24 PM    [facility] [address], [place], [place]  [address] Phone: [phone]; Fax: [phone]

## 2016-08-07 ENCOUNTER — Other Ambulatory Visit: Payer: Self-pay | Admitting: Internal Medicine

## 2016-08-21 ENCOUNTER — Telehealth: Payer: Self-pay | Admitting: Pulmonary Disease

## 2016-08-21 NOTE — Telephone Encounter (Signed)
Called and spoke to Kristine Simmons. Informed Tonya to contact pt's cardiologist to see if they may want to change pt's BP medication. Tonya verbalized understanding and denied any further questions or concerns at this time.

## 2016-08-22 ENCOUNTER — Telehealth: Payer: Self-pay | Admitting: Cardiology

## 2016-08-22 NOTE — Telephone Encounter (Signed)
Make sure she is taking the timolol drops appropriately to reduce systemic absorption (Applying pressure to the inside corner of the eye). Otherwise would continue to monitor and call if she trends down further or other orthostatic issues.

## 2016-08-22 NOTE — Telephone Encounter (Signed)
Returned call to hospice RN. She notes she comes out weekly to see patient, has BP trends as noted below. States patient does not have means to check her BP on other days.  Pt had a recent fall <1 week ago. Has had some lightheadedness on standing. Does not have orthostatic readings. She is currently on diltiazem ER 120mg  and lasix 40mg  daily. She was taken off digoxin 1 month ago due to bradycardia in 30s, 40s.  I asked about recent HR readings since d/c of digoxin. Nurse reported VS: 8/16 - 100 BP 105/50 8/23 - 88 BP 100/68 8/30 - 88 BP 90/50  I asked about other meds - noted patient is also taking timolol eye drops, these may affect BP.  For now, I recommended to continue checks as routine, will get further advice. Noted if patient has repeat fall esp w injury she may need emergent evaluation. Aware family should notify us of any syncope/near syncope, dizziness, etc.

## 2016-08-22 NOTE — Telephone Encounter (Signed)
Left Tonya msg w instructions and advised to call if questions.

## 2016-08-22 NOTE — Telephone Encounter (Signed)
New Message  Joline Maxcy from [facility] call requesting to speak with RN about pts bp. Ms. Kristine Simmons states pt bp has been decreasing and would like to speak with RN. Please call back to discuss   Readings 8/6...Marland KitchenMarland Kitchen 106/60 8/23.Marland KitchenMarland KitchenMarland KitchenMarland Kitchen 100/68 8/30...Marland Kitchen. 90/50

## 2016-08-30 ENCOUNTER — Telehealth: Payer: Self-pay | Admitting: Internal Medicine

## 2016-08-30 NOTE — Telephone Encounter (Signed)
Error

## 2016-09-03 ENCOUNTER — Encounter: Payer: Self-pay | Admitting: Pulmonary Disease

## 2016-09-03 ENCOUNTER — Ambulatory Visit (INDEPENDENT_AMBULATORY_CARE_PROVIDER_SITE_OTHER): Payer: Medicare Other | Admitting: Pulmonary Disease

## 2016-09-03 DIAGNOSIS — R1314 Dysphagia, pharyngoesophageal phase: Secondary | ICD-10-CM | POA: Diagnosis not present

## 2016-09-03 DIAGNOSIS — J438 Other emphysema: Secondary | ICD-10-CM

## 2016-09-03 DIAGNOSIS — I5031 Acute diastolic (congestive) heart failure: Secondary | ICD-10-CM | POA: Diagnosis not present

## 2016-09-03 DIAGNOSIS — R0902 Hypoxemia: Secondary | ICD-10-CM

## 2016-09-03 MED ORDER — BUDESONIDE-FORMOTEROL FUMARATE 80-4.5 MCG/ACT IN AERO: 2.0000 | INHALATION_SPRAY | Freq: Two times a day (BID) | RESPIRATORY_TRACT | 11 refills | Status: DC

## 2016-09-03 NOTE — Patient Instructions (Signed)
Keep taking Symbicort as you are doing Do not focus on your oxygen saturation, pay more attention to how you feel. Specifically, let us know if you're having more trouble breathing, more cough, or swelling. Stay as active as possible Get a flu shot tomorrow We will see you back in 2 months or sooner if needed

## 2016-09-03 NOTE — Assessment & Plan Note (Signed)
She has severe emphysema but only mild airflow obstruction. As noted previously her hypoxemia is a complex process and not due solely to COPD. Fortunately this is been a stable interval for her.  Plan: Continue Symbicort 80/4.52 puffs twice a day Flu shot tomorrow Follow-up 2 months

## 2016-09-03 NOTE — Assessment & Plan Note (Signed)
She is incredibly focused on her O2 saturation which is we have proven in clinic is nearly always wrong. Specifically, as I have stated multiple times finger probe grossly underestimates read to saturation number. Today for example her O2 saturation was 82% on 5 L from the finger probe while her for head probe read 93%.  I encouraged her today to release her obsession from the O2 saturation number. Specifically, I told her to focus much more on how she felt and to let us know if she has trouble breathing, cough, shortness of breath, or increasing swelling.  She will continue using 5L O2 continuously

## 2016-09-03 NOTE — Assessment & Plan Note (Signed)
Encouraged to use ensure when not able to finish a meal.

## 2016-09-03 NOTE — Progress Notes (Signed)
Subjective:    Patient ID: Kristine Simmons, female    DOB: [birth year], 82 y.o.   MRN: [id number]  Synopsis: GOLD GRADE A COPD With severe hypoxemia secondary to recurrent aspiration leading to basilar pulmonary scarring. January 2016 pulmonary function testing> ratio 58% FEV1 1.83L (88% pred, no change with BD), TLC 5.78L (103% pred), DLCO 9.01 (31% pred) 03/2015 CT chest> patulous esophagus, basilar scarring She has overnight hypoxemia and uses 4 L continuously.  HPI Chief Complaint  Patient presents with  . Follow-up    pt c/o fluxuating 02 readings- feels sob when 02 levels are lower.  pt has home oximeter.     She has been having a lot of itching and redness around her eyes lately. She notes that she takes symbicort 2 puffs twice a day but she has been having a hard time filling it from the drug store. She hsa been monitoring her oxygen frequently. She can't use the oximyzer due to her hearing aids. She is focused a lot on her oxygen level.  She says that she feels worse when her oxygen level is lower.   She is not using albuterol nebulizer at home.  Her weight has remained stable since the last visit, 120 lbs.   She coughs when she eats.  She is eating what she wants, but she continues to spit up a lot of phlegm.  She is using ensure one time a day.    Past Medical History:  Diagnosis Date  . Acute diastolic heart failure ([place]) 11/03/2015  . Aortic stenosis, mild 08/15/2014   Echo June, 2015, mild aortic stenosis   . Atrial fibrillation ([place])    Dr Percival Spanish  . 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 Cardiorenal syndrome 11/05/2015  . CAROTID STENOSIS 08/08/2009   Qualifier: Diagnosis of  By: Percival Spanish, MD, Farrel Gordon    . Cerebrovascular accident ([place]) 01/09  . Chronic anticoagulation 08/15/2014   Eliquis for A fib   . Complication of anesthesia    difficulty with arousal post op  . COPD, Gold grade A 04/08/2008   January 2016 pulmonary function testing>  ratio 58% FEV1 1.83L (88% pred, no change with BD), TLC 5.78L (103% pred), DLCO 9.01 (31% pred) 07/2015 ONO RA > spO2 79% average 07/2015 ONO 2LPM> < 88% 189 min 08/2015 ONO 3LPM < 88% 69.8 min 09/2015 ONO 4LPM > [place]   . Cystitis   . Depression 09/22/2014  . Fracture 1995, 2009   RUE; LUE Dr. Durward Fortes  . G E R D 06/02/2007   Qualifier: Diagnosis of  By: Linna Darner MD, Gwyndolyn Saxon    . Hx of colonic polyps    last colonoscopy 2005, repeat was due 2010 Dr. Sharlett Iles  . Hyperlipidemia   . HYPERTENSION, ESSENTIAL NOS 12/01/2007   Qualifier: Diagnosis of  By: Linna Darner MD, Gwyndolyn Saxon    . Hypothyroidism   . Osteoporosis   . Spinal stenosis of lumbar region 01/10/2016  . Squamous cell carcinoma of neck ([place]) 03/09/2014   3/15 Dr Wilhemina Bonito   . T12 compression fracture ([place]) 11/02/2015      Review of Systems  Constitutional: Negative for chills, fatigue and fever.  HENT: Negative for rhinorrhea, sinus pressure and sneezing.   Respiratory: Positive for cough and shortness of breath. Negative for wheezing.   Cardiovascular: Negative for chest pain, palpitations and leg swelling.       Objective:   Physical Exam Vitals:   09/03/16 1022  BP:  116/64  Pulse: 60  SpO2: 92%  Weight: 124 lb (56.2 kg)  Height: 5\' 7"  (1.702 m)  93% on 5L [place] pulse on forehead probe 82% on 5L [place] pulse on finger probe  Gen: well appearing, no acute distress HEENT: NCAT, EOMi, OP clear,  PULM: Surprisingly clear on exam today CV: RRR, systolic murmur, JVD noted AB: BS+, soft, nontender,  Ext: warm, no edema, no clubbing, no cyanosis Derm: no rash or skin breakdown Neuro: A&Ox4, MAEW   CT chest from July 2017 personally reviewed showing bilateral pulmonary edema and pleural effusions Hospital discharge records reviewed from July 2017 when she was placed on hospice.     Assessment & Plan:   COPD, Gold grade A She has severe emphysema but only mild airflow obstruction. As noted previously her hypoxemia is a complex  process and not due solely to COPD. Fortunately this is been a stable interval for her.  Plan: Continue Symbicort 80/4.52 puffs twice a day Flu shot tomorrow Follow-up 2 months  Hypoxemia She is incredibly focused on her O2 saturation which is we have proven in clinic is nearly always wrong. Specifically, as I have stated multiple times finger probe grossly underestimates read to saturation number. Today for example her O2 saturation was 82% on 5 L from the finger probe while her for head probe read 93%.  I encouraged her today to release her obsession from the O2 saturation number. Specifically, I told her to focus much more on how she felt and to let us know if she has trouble breathing, cough, shortness of breath, or increasing swelling.  She will continue using 5L O2 continuously  Dysphagia, pharyngoesophageal phase Encouraged to use ensure when not able to finish a meal.  Acute diastolic heart failure ([place]) Appears well compensated today.  Plan: Continue current cardiac regimen, continue daily weights   Updated Medication List Outpatient Encounter Prescriptions as of 09/03/2016  Medication Sig  . albuterol (VENTOLIN HFA) 108 (90 BASE) MCG/ACT inhaler Inhale 2 puffs into the lungs every 4 (four) hours as needed for wheezing or shortness of breath.   Marland Kitchen apixaban (ELIQUIS) 2.5 MG TABS tablet Take 2.5 mg by mouth 2 (two) times daily.  . budesonide-formoterol (SYMBICORT) 80-4.5 MCG/ACT inhaler Inhale 2 puffs into the lungs 2 (two) times daily.  Marland Kitchen diltiazem (DILTIAZEM CD) 120 MG 24 hr capsule Take 120 mg by mouth daily.  Marland Kitchen ENSURE PLUS (ENSURE PLUS) LIQD TAKE ONE CAN BY MOUTH ONCE DAILY  . estradiol (ESTRACE) 0.1 MG/GM vaginal cream Place 2 g vaginally 2 (two) times a week. On Wednesdays & Saturdays.  Marland Kitchen ezetimibe-simvastatin (VYTORIN) 10-40 MG tablet Take 1 tablet by mouth daily.  . furosemide (LASIX) 40 MG tablet Take 1 tablet (40 mg total) by mouth daily.  Marland Kitchen levothyroxine (SYNTHROID,  LEVOTHROID) 50 MCG tablet Take 50 mcg by mouth daily before breakfast.  . OXYGEN Inhale into the lungs continuous. Sitting or resting flow should be on 4 L  And during activities use 5-6 L  . polyethylene glycol powder (GLYCOLAX/MIRALAX) powder Take 17 g by mouth daily. Mix 1 capful of powder and dissolve it in any liquid. Drink once daily.  . Probiotic Product (MISC INTESTINAL FLORA REGULAT) CAPS Take 1 capsule by mouth daily.    . sertraline (ZOLOFT) 25 MG tablet Take 1 tablet (25 mg total) by mouth daily. In addition to 50 mg daily for a total of 75 mg daily  . sertraline (ZOLOFT) 50 MG tablet Take 1 tablet (50 mg  total) by mouth daily.  Marland Kitchen Spacer/Aero-Holding Chambers (AEROCHAMBER MV) inhaler Use as instructed  . timolol (TIMOPTIC) 0.5 % ophthalmic solution Place 1 drop into both eyes 2 (two) times daily.   Marland Kitchen trimethoprim (TRIMPEX) 100 MG tablet Take 100 mg by mouth every other day.   . [DISCONTINUED] budesonide-formoterol (SYMBICORT) 80-4.5 MCG/ACT inhaler Inhale 2 puffs into the lungs 2 (two) times daily.  . [DISCONTINUED] apraclonidine (IOPIDINE) 0.5 % ophthalmic solution Place 1 drop into the right eye every 12 (twelve) hours.  . [DISCONTINUED] ENSURE PLUS (ENSURE PLUS) LIQD TAKE ONE CAN BY MOUTH ONCE DAILY (Patient not taking: Reported on 09/03/2016)   No facility-administered encounter medications on file as of 09/03/2016.

## 2016-09-03 NOTE — Assessment & Plan Note (Signed)
Appears well compensated today.  Plan: Continue current cardiac regimen, continue daily weights

## 2016-09-25 ENCOUNTER — Other Ambulatory Visit: Payer: Self-pay | Admitting: Internal Medicine

## 2016-09-30 ENCOUNTER — Encounter: Payer: Self-pay | Admitting: Cardiology

## 2016-09-30 NOTE — Progress Notes (Signed)
Subjective:    Patient ID: Kristine Simmons, female    DOB: Dec 30, 1926, 82 y.o.   MRN: [id number]  HPI She is here for a physical exam.   She had a wellness visit 12/2015.  Her eye lids are very itchy and are slightly red. She denies any irritation inside her eyes.  The lower eye lids are slightly swollen.  She is following with eye doctor.  She has tried otc eye drops but they have not helped.  Her breathing is stable.  She is following with pulmonary.  She does get frustrated with the oxygen tubing at home - her and her husband have to be careful not to trip over it.  She gets SOB with exertion.  Her oxygen at rest is at baseline.  She coughs on a daily basis - often while she eats.  She uses her inhalers as prescribed.     Anxiety, Depression: She is taking her medication daily as prescribed. She denies any side effects from the medication. She feels her depression and anxiety are well controlled and she is happy with her current dose of medication.     Medications and allergies reviewed with patient and updated if appropriate.  Patient Active Problem List   Diagnosis Date Noted  . Chronic obstructive pulmonary disease ([place])   . Pleural effusion   . Hypoxia 07/07/2016  . UTI (urinary tract infection) with e coli 06/13/2016  . Pneumonia 06/10/2016  . Moderate to severe pulmonary hypertension 04/04/2016  . Moderate tricuspid regurgitation 04/04/2016  . Spinal stenosis of lumbar region 01/10/2016  . Cardiorenal syndrome 11/05/2015  . Palliative care encounter   . Goals of care, counseling/discussion   . Acute diastolic heart failure ([place]) 11/03/2015  . Respiratory distress 11/02/2015  . Acute on chronic respiratory failure with hypoxia ([place]) 11/02/2015  . Chronic atrial fibrillation ([place]) 11/02/2015  . T12 compression fracture ([place]) 11/02/2015  . Acute respiratory failure with hypoxia ([place]) 11/02/2015  . Elevated LFTs 04/12/2015  . Aspiration pneumonia ([place]) 04/09/2015  . CAP  (community acquired pneumonia) 04/08/2015  . Dysphagia, pharyngoesophageal phase 03/07/2015  . Recurrent inguinal hernia with obstruction 11/11/2014  . Depression 09/22/2014  . Hypoxemia 09/22/2014  . Aortic stenosis, mild 08/15/2014  . Chronic anticoagulation 08/15/2014  . Recurrent femoral hernia with obstruction without gangrene 08/15/2014  . History of CVA (cerebrovascular accident) 08/15/2014  . Squamous cell carcinoma of neck ([place]) 03/09/2014  . Cough 09/16/2012  . Diverticulosis 09/11/2011  . Glaucoma 09/11/2011  . CAROTID STENOSIS 08/08/2009  . HEARING LOSS 11/22/2008  . COPD, Gold grade A 04/08/2008  . HERNIA 04/08/2008  . ARTHRITIS 04/08/2008  . VITAMIN D DEFICIENCY 02/01/2008  . Atrial fibrillation ([place]) 02/01/2008  . Essential hypertension 12/01/2007  . OSTEOPOROSIS 12/01/2007  . Hypothyroidism 08/06/2007  . DISEQUILIBRIUM 07/24/2007  . G E R D 06/02/2007  . THYROID NODULE, LEFT 04/10/2007  . Hyperlipidemia 04/10/2007  . COLONIC POLYPS, HX OF 04/10/2007    Current Outpatient Prescriptions on File Prior to Visit  Medication Sig Dispense Refill  . albuterol (VENTOLIN HFA) 108 (90 BASE) MCG/ACT inhaler Inhale 2 puffs into the lungs every 4 (four) hours as needed for wheezing or shortness of breath.     Marland Kitchen apixaban (ELIQUIS) 2.5 MG TABS tablet Take 2.5 mg by mouth 2 (two) times daily.    . budesonide-formoterol (SYMBICORT) 80-4.5 MCG/ACT inhaler Inhale 2 puffs into the lungs 2 (two) times daily. 1 Inhaler 11  . diltiazem (DILTIAZEM CD) 120 MG 24  hr capsule Take 120 mg by mouth daily.    Marland Kitchen ENSURE PLUS (ENSURE PLUS) LIQD TAKE ONE CAN BY MOUTH ONCE DAILY 237 mL 5  . estradiol (ESTRACE) 0.1 MG/GM vaginal cream Place 2 g vaginally 2 (two) times a week. On Wednesdays & Saturdays.    Marland Kitchen ezetimibe-simvastatin (VYTORIN) 10-40 MG tablet Take 1 tablet by mouth daily.    . furosemide (LASIX) 40 MG tablet Take 1 tablet (40 mg total) by mouth daily. 90 tablet 3  . levothyroxine  (SYNTHROID, LEVOTHROID) 50 MCG tablet TAKE 1 TABLET ONCE DAILY. 90 tablet 1  . OXYGEN Inhale into the lungs continuous. Sitting or resting flow should be on 4 L  And during activities use 5-6 L    . polyethylene glycol powder (GLYCOLAX/MIRALAX) powder Take 17 g by mouth daily. Mix 1 capful of powder and dissolve it in any liquid. Drink once daily. 255 g 0  . Probiotic Product (MISC INTESTINAL FLORA REGULAT) CAPS Take 1 capsule by mouth daily.      . sertraline (ZOLOFT) 25 MG tablet TAKE ONE TABLET EACH DAY WITH 50MG  TO GET TOTAL DOSE OF 75MG . 90 tablet 1  . sertraline (ZOLOFT) 50 MG tablet Take 1 tablet (50 mg total) by mouth daily. 90 tablet 3  . Spacer/Aero-Holding Chambers (AEROCHAMBER MV) inhaler Use as instructed 1 each 0  . timolol (TIMOPTIC) 0.5 % ophthalmic solution Place 1 drop into both eyes 2 (two) times daily.     Marland Kitchen trimethoprim (TRIMPEX) 100 MG tablet Take 100 mg by mouth every other day.      No current facility-administered medications on file prior to visit.     Past Medical History:  Diagnosis Date  . Acute diastolic heart failure ([place]) 11/03/2015  . Aortic stenosis, mild 08/15/2014   Echo June, 2015, mild aortic stenosis   . Atrial fibrillation ([place])    Dr Percival Spanish  . 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 Cardiorenal syndrome 11/05/2015  . CAROTID STENOSIS 08/08/2009   Qualifier: Diagnosis of  By: Percival Spanish, MD, Farrel Gordon    . Cerebrovascular accident ([place]) 01/09  . Chronic anticoagulation 08/15/2014   Eliquis for A fib   . Complication of anesthesia    difficulty with arousal post op  . COPD, Gold grade A 04/08/2008   January 2016 pulmonary function testing> ratio 58% FEV1 1.83L (88% pred, no change with BD), TLC 5.78L (103% pred), DLCO 9.01 (31% pred) 07/2015 ONO RA > spO2 79% average 07/2015 ONO 2LPM> < 88% 189 min 08/2015 ONO 3LPM < 88% 69.8 min 09/2015 ONO 4LPM > OK   . Cystitis   . Depression 09/22/2014  . Fracture 1995, 2009   RUE; LUE Dr.  Durward Fortes  . G E R D 06/02/2007   Qualifier: Diagnosis of  By: Linna Darner MD, Gwyndolyn Saxon    . Hx of colonic polyps    last colonoscopy 2005, repeat was due 2010 Dr. Sharlett Iles  . Hyperlipidemia   . HYPERTENSION, ESSENTIAL NOS 12/01/2007   Qualifier: Diagnosis of  By: Linna Darner MD, Gwyndolyn Saxon    . Hypothyroidism   . Osteoporosis   . Spinal stenosis of lumbar region 01/10/2016  . Squamous cell carcinoma of neck 03/09/2014   3/15 Dr Wilhemina Bonito   . T12 compression fracture ([place]) 11/02/2015    Past Surgical History:  Procedure Laterality Date  . APPENDECTOMY    . CATARACT EXTRACTION     [place]  . COLONOSCOPY W/ POLYPECTOMY  2005  Diverticulosis;Dr. Sharlett Iles  . EXCISION METACARPAL MASS Right 08/17/2013   Procedure: RIGHT LONG EXCISION MASS AND DIP JOINT DEBRIDEMENT;  Surgeon: Tennis Must, MD;  Location: [facility];  Service: Orthopedics;  Laterality: Right;  . FEMORAL HERNIA REPAIR Right 08/17/2014   Procedure: OPEN REPAIR RIGHT FEMORAL HERNIA  WITH INSERTION OF MESH;  Surgeon: Adin Hector, MD;  Location: [facility];  Service: General;  Laterality: Right;  . INGUINAL HERNIA REPAIR Right 11/15/2014   Procedure: LAPAROSCOPIC RIGHT INGUINAL HERNIA WITH MESH;  Surgeon: Alphonsa Overall, MD;  Location: [facility];  Service: General;  Laterality: Right;  . INSERTION OF MESH  08/17/2014   Procedure: INSERTION OF MESH;  Surgeon: Adin Hector, MD;  Location: [facility];  Service: General;;  . MOHS SURGERY  2013   nasal Basal Cell; Dr Sarajane Jews  . Thyroid Needle Biopsy  2002  . TONSILLECTOMY AND ADENOIDECTOMY    . TOTAL HIP ARTHROPLASTY  2008   CHF & RAF post op   . TOTAL SHOULDER REPLACEMENT  2010   Dr Durward Fortes - left shoulder  . Vocal Cord Polyps      Social History   Social History  . Marital status: Married    Spouse name: N/A  . Number of children: N/A  . Years of education: N/A   Social History Main Topics  . Smoking status: Former Smoker    Packs/day: 1.00    Years: 45.00    Types:  Cigarettes    Quit date: 12/23/1993  . Smokeless tobacco: Never Used     Comment: Smoked 1950-1995, up to 1 ppd  . Alcohol use No  . Drug use: No  . Sexual activity: No   Other Topics Concern  . Not on file   Social History Narrative  . No narrative on file    Family History  Problem Relation Age of Onset  . Stroke Father     > 108  . Diabetes Father   . Leukemia Mother   . Cancer Mother     leukemia  . Colon cancer Brother     Valve replacement  . Heart attack Brother   . Breast cancer Sister   . Ovarian cancer Sister   . Lung cancer Sister     smoker  . Prostate cancer Brother   . Cancer Brother     bladder    Review of Systems  Constitutional: Negative for appetite change, chills, fever and unexpected weight change.  HENT: Negative for trouble swallowing.   Eyes: Positive for visual disturbance.       Eyelid itching and irritation  Respiratory: Positive for cough, shortness of breath and wheezing.   Cardiovascular: Positive for palpitations (exertion related) and leg swelling (mild). Negative for chest pain.  Gastrointestinal: Positive for constipation. Negative for abdominal pain, diarrhea and nausea.       No gerd  Genitourinary: Negative for dysuria and hematuria.  Musculoskeletal: Positive for gait problem.  Neurological: Positive for dizziness. Negative for light-headedness and headaches.  Psychiatric/Behavioral: Positive for dysphoric mood (controlled). The patient is nervous/anxious (controlled).        Objective:   Vitals:   10/01/16 0959  BP: 102/70  Pulse: (!) 109  Resp: 18  Temp: 97.6 F (36.4 C)   Filed Weights   10/01/16 0959  Weight: 128 lb (58.1 kg)   Body mass index is 20.05 kg/m.   Physical Exam Constitutional: She appears well-developed and well-nourished. No distress.  HENT:  Head: Normocephalic and  atraumatic.  Right Ear: External ear normal. Normal ear canal and TM Left Ear: External ear normal.  Normal ear canal and  TM Mouth/Throat: Oropharynx is clear and moist.  Eyes: lower eye lids b/l with mild erythema and swelling, Conjunctivae normal, no discharge.  Neck: Neck supple. No tracheal deviation present. No thyromegaly present.  No carotid bruit  Cardiovascular: Normal rate, regular rhythm and normal heart sounds.   2/6 systolic murmur heard.  Mild b/l LE edema. Pulmonary/Chest: Effort normal and breath sounds normal. Diffusely decreased BS.  No respiratory distress. She has no wheezes. She has no rales.  Breast: deferred  Abdominal: Soft. She exhibits no distension. There is no tenderness.  Lymphadenopathy: She has no cervical adenopathy.  Skin: Skin is warm and dry. She is not diaphoretic.  Psychiatric: She has a normal mood and affect. Her behavior is normal.       Assessment & Plan:   Physical exam: Screening blood work - blood work done in last few months reviewed - will hold off on blood work for now Immunizations  Flu vaccine today, others up to date Colonoscopy - no longer needed at her age 4   - no longer needed at her age Dexa - done last year, 2016 Eye exams   Up to date  Exercise - unable to exercise due to severe COPD, chronic respiratory failure Weight - good BMI for age Skin - no acute concerns Substance abuse - none  See Problem List for Assessment and Plan of chronic medical problems.

## 2016-10-01 ENCOUNTER — Encounter: Payer: Self-pay | Admitting: Internal Medicine

## 2016-10-01 ENCOUNTER — Ambulatory Visit: Payer: Medicare Other | Admitting: Internal Medicine

## 2016-10-01 VITALS — BP 102/70 | HR 109 | Temp 97.6°F | Resp 18 | Ht 67.0 in | Wt 128.0 lb

## 2016-10-01 DIAGNOSIS — I482 Chronic atrial fibrillation, unspecified: Secondary | ICD-10-CM

## 2016-10-01 DIAGNOSIS — F329 Major depressive disorder, single episode, unspecified: Secondary | ICD-10-CM

## 2016-10-01 DIAGNOSIS — I10 Essential (primary) hypertension: Secondary | ICD-10-CM

## 2016-10-01 DIAGNOSIS — E785 Hyperlipidemia, unspecified: Secondary | ICD-10-CM

## 2016-10-01 DIAGNOSIS — Z0000 Encounter for general adult medical examination without abnormal findings: Secondary | ICD-10-CM

## 2016-10-01 DIAGNOSIS — F32A Depression, unspecified: Secondary | ICD-10-CM

## 2016-10-01 DIAGNOSIS — E039 Hypothyroidism, unspecified: Secondary | ICD-10-CM

## 2016-10-01 NOTE — Assessment & Plan Note (Signed)
Rate improved after rest On eliquis Sees cardio annually Continue current meds

## 2016-10-01 NOTE — Assessment & Plan Note (Signed)
Controlled, stable Continue current dose of medication  

## 2016-10-01 NOTE — Assessment & Plan Note (Signed)
Lab Results  Component Value Date   TSH 3.74 01/10/2016   Continue current dose of medication Recheck tsh at next visit

## 2016-10-01 NOTE — Assessment & Plan Note (Signed)
Continue vytorin.   

## 2016-10-01 NOTE — Assessment & Plan Note (Signed)
BP well controlled Current regimen effective and well tolerated Continue current medications at current doses  

## 2016-10-01 NOTE — Progress Notes (Signed)
Pre visit review using our clinic review tool, if applicable. No additional management support is needed unless otherwise documented below in the visit note. 

## 2016-10-01 NOTE — Patient Instructions (Signed)
Test(s) ordered today. Your results will be released to MyChart (or called to you) after review, usually within 72hours after test completion. If any changes need to be made, you will be notified at that same time.  All other Health Maintenance issues reviewed.   All recommended immunizations and age-appropriate screenings are up-to-date or discussed.  No immunizations administered today.   Medications reviewed and updated.  No changes recommended at this time.   Please followup in 6 months  Health Maintenance, Female Adopting a healthy lifestyle and getting preventive care can go a long way to promote health and wellness. Talk with your health care provider about what schedule of regular examinations is right for you. This is a good chance for you to check in with your provider about disease prevention and staying healthy. In between checkups, there are plenty of things you can do on your own. Experts have done a lot of research about which lifestyle changes and preventive measures are most likely to keep you healthy. Ask your health care provider for more information. WEIGHT AND DIET  Eat a healthy diet  Be sure to include plenty of vegetables, fruits, low-fat dairy products, and lean protein.  Do not eat a lot of foods high in solid fats, added sugars, or salt.  Get regular exercise. This is one of the most important things you can do for your health.  Most adults should exercise for at least 150 minutes each week. The exercise should increase your heart rate and make you sweat (moderate-intensity exercise).  Most adults should also do strengthening exercises at least twice a week. This is in addition to the moderate-intensity exercise.  Maintain a healthy weight  Body mass index (BMI) is a measurement that can be used to identify possible weight problems. It estimates body fat based on height and weight. Your health care provider can help determine your BMI and help you achieve or  maintain a healthy weight.  For females 20 years of age and older:   A BMI below 18.5 is considered underweight.  A BMI of 18.5 to 24.9 is normal.  A BMI of 25 to 29.9 is considered overweight.  A BMI of 30 and above is considered obese.  Watch levels of cholesterol and blood lipids  You should start having your blood tested for lipids and cholesterol at 80 years of age, then have this test every 5 years.  You may need to have your cholesterol levels checked more often if:  Your lipid or cholesterol levels are high.  You are older than 80 years of age.  You are at high risk for heart disease.  CANCER SCREENING   Lung Cancer  Lung cancer screening is recommended for adults 55-80 years old who are at high risk for lung cancer because of a history of smoking.  A yearly low-dose CT scan of the lungs is recommended for people who:  Currently smoke.  Have quit within the past 15 years.  Have at least a 30-pack-year history of smoking. A pack year is smoking an average of one pack of cigarettes a day for 1 year.  Yearly screening should continue until it has been 15 years since you quit.  Yearly screening should stop if you develop a health problem that would prevent you from having lung cancer treatment.  Breast Cancer  Practice breast self-awareness. This means understanding how your breasts normally appear and feel.  It also means doing regular breast self-exams. Let your health care provider know   about any changes, no matter how small.  If you are in your 20s or 30s, you should have a clinical breast exam (CBE) by a health care provider every 1-3 years as part of a regular health exam.  If you are 40 or older, have a CBE every year. Also consider having a breast X-ray (mammogram) every year.  If you have a family history of breast cancer, talk to your health care provider about genetic screening.  If you are at high risk for breast cancer, talk to your health care  provider about having an MRI and a mammogram every year.  Breast cancer gene (BRCA) assessment is recommended for women who have family members with BRCA-related cancers. BRCA-related cancers include:  Breast.  Ovarian.  Tubal.  Peritoneal cancers.  Results of the assessment will determine the need for genetic counseling and BRCA1 and BRCA2 testing. Cervical Cancer Your health care provider may recommend that you be screened regularly for cancer of the pelvic organs (ovaries, uterus, and vagina). This screening involves a pelvic examination, including checking for microscopic changes to the surface of your cervix (Pap test). You may be encouraged to have this screening done every 3 years, beginning at age 21.  For women ages 30-65, health care providers may recommend pelvic exams and Pap testing every 3 years, or they may recommend the Pap and pelvic exam, combined with testing for human papilloma virus (HPV), every 5 years. Some types of HPV increase your risk of cervical cancer. Testing for HPV may also be done on women of any age with unclear Pap test results.  Other health care providers may not recommend any screening for nonpregnant women who are considered low risk for pelvic cancer and who do not have symptoms. Ask your health care provider if a screening pelvic exam is right for you.  If you have had past treatment for cervical cancer or a condition that could lead to cancer, you need Pap tests and screening for cancer for at least 20 years after your treatment. If Pap tests have been discontinued, your risk factors (such as having a new sexual partner) need to be reassessed to determine if screening should resume. Some women have medical problems that increase the chance of getting cervical cancer. In these cases, your health care provider may recommend more frequent screening and Pap tests. Colorectal Cancer  This type of cancer can be detected and often prevented.  Routine  colorectal cancer screening usually begins at 80 years of age and continues through 80 years of age.  Your health care provider may recommend screening at an earlier age if you have risk factors for colon cancer.  Your health care provider may also recommend using home test kits to check for hidden blood in the stool.  A small camera at the end of a tube can be used to examine your colon directly (sigmoidoscopy or colonoscopy). This is done to check for the earliest forms of colorectal cancer.  Routine screening usually begins at age 50.  Direct examination of the colon should be repeated every 5-10 years through 80 years of age. However, you may need to be screened more often if early forms of precancerous polyps or small growths are found. Skin Cancer  Check your skin from head to toe regularly.  Tell your health care provider about any new moles or changes in moles, especially if there is a change in a mole's shape or color.  Also tell your health care provider if you   have a mole that is larger than the size of a pencil eraser.  Always use sunscreen. Apply sunscreen liberally and repeatedly throughout the day.  Protect yourself by wearing long sleeves, pants, a wide-brimmed hat, and sunglasses whenever you are outside. HEART DISEASE, DIABETES, AND HIGH BLOOD PRESSURE   High blood pressure causes heart disease and increases the risk of stroke. High blood pressure is more likely to develop in:  People who have blood pressure in the high end of the normal range (130-139/85-89 mm Hg).  People who are overweight or obese.  People who are African American.  If you are 18-39 years of age, have your blood pressure checked every 3-5 years. If you are 40 years of age or older, have your blood pressure checked every year. You should have your blood pressure measured twice--once when you are at a hospital or clinic, and once when you are not at a hospital or clinic. Record the average of the  two measurements. To check your blood pressure when you are not at a hospital or clinic, you can use:  An automated blood pressure machine at a pharmacy.  A home blood pressure monitor.  If you are between 55 years and 79 years old, ask your health care provider if you should take aspirin to prevent strokes.  Have regular diabetes screenings. This involves taking a blood sample to check your fasting blood sugar level.  If you are at a normal weight and have a low risk for diabetes, have this test once every three years after 80 years of age.  If you are overweight and have a high risk for diabetes, consider being tested at a younger age or more often. PREVENTING INFECTION  Hepatitis B  If you have a higher risk for hepatitis B, you should be screened for this virus. You are considered at high risk for hepatitis B if:  You were born in a country where hepatitis B is common. Ask your health care provider which countries are considered high risk.  Your parents were born in a high-risk country, and you have not been immunized against hepatitis B (hepatitis B vaccine).  You have HIV or AIDS.  You use needles to inject street drugs.  You live with someone who has hepatitis B.  You have had sex with someone who has hepatitis B.  You get hemodialysis treatment.  You take certain medicines for conditions, including cancer, organ transplantation, and autoimmune conditions. Hepatitis C  Blood testing is recommended for:  Everyone born from 1945 through 1965.  Anyone with known risk factors for hepatitis C. Sexually transmitted infections (STIs)  You should be screened for sexually transmitted infections (STIs) including gonorrhea and chlamydia if:  You are sexually active and are younger than 80 years of age.  You are older than 80 years of age and your health care provider tells you that you are at risk for this type of infection.  Your sexual activity has changed since you were  last screened and you are at an increased risk for chlamydia or gonorrhea. Ask your health care provider if you are at risk.  If you do not have HIV, but are at risk, it may be recommended that you take a prescription medicine daily to prevent HIV infection. This is called pre-exposure prophylaxis (PrEP). You are considered at risk if:  You are sexually active and do not regularly use condoms or know the HIV status of your partner(s).  You take drugs by injection.    You are sexually active with a partner who has HIV. Talk with your health care provider about whether you are at high risk of being infected with HIV. If you choose to begin PrEP, you should first be tested for HIV. You should then be tested every 3 months for as long as you are taking PrEP.  PREGNANCY   If you are premenopausal and you may become pregnant, ask your health care provider about preconception counseling.  If you may become pregnant, take 400 to 800 micrograms (mcg) of folic acid every day.  If you want to prevent pregnancy, talk to your health care provider about birth control (contraception). OSTEOPOROSIS AND MENOPAUSE   Osteoporosis is a disease in which the bones lose minerals and strength with aging. This can result in serious bone fractures. Your risk for osteoporosis can be identified using a bone density scan.  If you are 80 years of age or older, or if you are at risk for osteoporosis and fractures, ask your health care provider if you should be screened.  Ask your health care provider whether you should take a calcium or vitamin D supplement to lower your risk for osteoporosis.  Menopause may have certain physical symptoms and risks.  Hormone replacement therapy may reduce some of these symptoms and risks. Talk to your health care provider about whether hormone replacement therapy is right for you.  HOME CARE INSTRUCTIONS   Schedule regular health, dental, and eye exams.  Stay current with your  immunizations.   Do not use any tobacco products including cigarettes, chewing tobacco, or electronic cigarettes.  If you are pregnant, do not drink alcohol.  If you are breastfeeding, limit how much and how often you drink alcohol.  Limit alcohol intake to no more than 1 drink per day for nonpregnant women. One drink equals 12 ounces of beer, 5 ounces of wine, or 1 ounces of hard liquor.  Do not use street drugs.  Do not share needles.  Ask your health care provider for help if you need support or information about quitting drugs.  Tell your health care provider if you often feel depressed.  Tell your health care provider if you have ever been abused or do not feel safe at home.   This information is not intended to replace advice given to you by your health care provider. Make sure you discuss any questions you have with your health care provider.   Document Released: 06/24/2011 Document Revised: 12/30/2014 Document Reviewed: 11/10/2013 Elsevier Interactive Patient Education [employer].

## 2016-10-02 ENCOUNTER — Other Ambulatory Visit: Payer: Self-pay | Admitting: Pulmonary Disease

## 2016-10-13 ENCOUNTER — Telehealth: Payer: Self-pay | Admitting: Internal Medicine

## 2016-10-13 NOTE — Telephone Encounter (Signed)
On call- Hospice nurse called. Patient with Rx drops for glaucoma. Pending f/u opth appt next week. L eye red and sore.  Rec- take glaucoma meds as scheduled. Treat today with warm compresses, tylenol, tramadol. Call Opth tomorrow when office opens.

## 2016-10-13 NOTE — Progress Notes (Signed)
HPI The patient presents for followup of atrial fibrillation.   Since I last saw her she was in the hospital in July again with volume overload and  exacerbation of her acute on chronic respiratory failure.  I have reviewed these records.  She does have elevated pulmonary pressures and is seen by pulmonary for this. She has significant TR. However, there were no other valvular abnormalities. She is on chronic oxygen. She walks with a walker. She is frail and is followed by hospice.  We have adjusted her meds secondary to bradycardia and hypotension.  She has no new cardiovascular complaints. She does have continued chronic shortness of breath but this is unchanged from previous. Her weights have been stable. She denies any new swelling. She's not having any PND or orthopnea. She's not having any chest pressure, neck or arm discomfort. She doesn't really feel her heart racing.  Allergies  Allergen Reactions  . Hydrocodone Nausea And Vomiting  . Desmopressin Acetate     REACTION: swelling feet  . Dorzolamide Hcl Other (See Comments)    Burning in eyes and redness  . Prempro [Conj Estrog-Medroxyprogest Ace]     unknown  . Ciprofloxacin     REACTION: vaginal infection    Current Outpatient Prescriptions  Medication Sig Dispense Refill  . albuterol (VENTOLIN HFA) 108 (90 BASE) MCG/ACT inhaler Inhale 2 puffs into the lungs every 4 (four) hours as needed for wheezing or shortness of breath.     Marland Kitchen apixaban (ELIQUIS) 2.5 MG TABS tablet Take 2.5 mg by mouth 2 (two) times daily.    Marland Kitchen diltiazem (DILTIAZEM CD) 120 MG 24 hr capsule Take 120 mg by mouth daily.    Marland Kitchen ENSURE PLUS (ENSURE PLUS) LIQD TAKE ONE CAN BY MOUTH ONCE DAILY 237 mL 5  . estradiol (ESTRACE) 0.1 MG/GM vaginal cream Place 2 g vaginally 2 (two) times a week. On Wednesdays & Saturdays.    Marland Kitchen ezetimibe-simvastatin (VYTORIN) 10-40 MG tablet Take 1 tablet by mouth daily.    . furosemide (LASIX) 40 MG tablet Take 1 tablet (40 mg total) by  mouth daily. 90 tablet 3  . levothyroxine (SYNTHROID, LEVOTHROID) 50 MCG tablet TAKE 1 TABLET ONCE DAILY. 90 tablet 1  . OXYGEN Inhale into the lungs continuous. Sitting or resting flow should be on 4 L  And during activities use 5-6 L    . polyethylene glycol powder (GLYCOLAX/MIRALAX) powder Take 17 g by mouth daily. Mix 1 capful of powder and dissolve it in any liquid. Drink once daily. 255 g 0  . Probiotic Product (MISC INTESTINAL FLORA REGULAT) CAPS Take 1 capsule by mouth daily.      . sertraline (ZOLOFT) 25 MG tablet TAKE ONE TABLET EACH DAY WITH 50MG  TO GET TOTAL DOSE OF 75MG . 90 tablet 1  . sertraline (ZOLOFT) 50 MG tablet Take 1 tablet (50 mg total) by mouth daily. 90 tablet 3  . Spacer/Aero-Holding Chambers (AEROCHAMBER MV) inhaler Use as instructed 1 each 0  . SYMBICORT 80-4.5 MCG/ACT inhaler USE 2 PUFFS TWICE DAILY. 10.2 g 2  . timolol (TIMOPTIC) 0.5 % ophthalmic solution Place 1 drop into both eyes 2 (two) times daily.     Marland Kitchen trimethoprim (TRIMPEX) 100 MG tablet Take 100 mg by mouth every other day.      No current facility-administered medications for this visit.     Past Medical History:  Diagnosis Date  . Acute diastolic heart failure ([place]) 11/03/2015  . Aortic stenosis, mild 08/15/2014   Echo  June, 2015, mild aortic stenosis   . Atrial fibrillation ([place])    Dr Percival Spanish  . 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 Cardiorenal syndrome 11/05/2015  . CAROTID STENOSIS 08/08/2009   Qualifier: Diagnosis of  By: Percival Spanish, MD, Farrel Gordon    . Cerebrovascular accident ([place]) 01/09  . Chronic anticoagulation 08/15/2014   Eliquis for A fib   . Complication of anesthesia    difficulty with arousal post op  . COPD, Gold grade A 04/08/2008   January 2016 pulmonary function testing> ratio 58% FEV1 1.83L (88% pred, no change with BD), TLC 5.78L (103% pred), DLCO 9.01 (31% pred) 07/2015 ONO RA > spO2 79% average 07/2015 ONO 2LPM> < 88% 189 min 08/2015 ONO 3LPM < 88% 69.8 min  09/2015 ONO 4LPM > OK   . Cystitis   . Depression 09/22/2014  . Fracture 1995, 2009   RUE; LUE Dr. Durward Fortes  . G E R D 06/02/2007   Qualifier: Diagnosis of  By: Linna Darner MD, Gwyndolyn Saxon    . Hx of colonic polyps    last colonoscopy 2005, repeat was due 2010 Dr. Sharlett Iles  . Hyperlipidemia   . HYPERTENSION, ESSENTIAL NOS 12/01/2007   Qualifier: Diagnosis of  By: Linna Darner MD, Gwyndolyn Saxon    . Hypothyroidism   . Osteoporosis   . Spinal stenosis of lumbar region 01/10/2016  . Squamous cell carcinoma of neck 03/09/2014   3/15 Dr Wilhemina Bonito   . T12 compression fracture ([place]) 11/02/2015    Past Surgical History:  Procedure Laterality Date  . APPENDECTOMY    . CATARACT EXTRACTION     Bilat  . COLONOSCOPY W/ POLYPECTOMY  2005   Diverticulosis;Dr. Sharlett Iles  . EXCISION METACARPAL MASS Right 08/17/2013   Procedure: RIGHT LONG EXCISION MASS AND DIP JOINT DEBRIDEMENT;  Surgeon: Tennis Must, MD;  Location: [facility];  Service: Orthopedics;  Laterality: Right;  . FEMORAL HERNIA REPAIR Right 08/17/2014   Procedure: OPEN REPAIR RIGHT FEMORAL HERNIA  WITH INSERTION OF MESH;  Surgeon: Adin Hector, MD;  Location: [facility];  Service: General;  Laterality: Right;  . INGUINAL HERNIA REPAIR Right 11/15/2014   Procedure: LAPAROSCOPIC RIGHT INGUINAL HERNIA WITH MESH;  Surgeon: Alphonsa Overall, MD;  Location: [facility];  Service: General;  Laterality: Right;  . INSERTION OF MESH  08/17/2014   Procedure: INSERTION OF MESH;  Surgeon: Adin Hector, MD;  Location: [facility];  Service: General;;  . MOHS SURGERY  2013   nasal Basal Cell; Dr Sarajane Jews  . Thyroid Needle Biopsy  2002  . TONSILLECTOMY AND ADENOIDECTOMY    . TOTAL HIP ARTHROPLASTY  2008   CHF & RAF post op   . TOTAL SHOULDER REPLACEMENT  2010   Dr Durward Fortes - left shoulder  . Vocal Cord Polyps     ROS:  Left eye blindness , she has some swelling in her left thigh occasional nausea. Otherwise as stated in the HPI and negative for all other  systems.  PHYSICAL EXAM BP 110/70 (BP Location: Left Arm, Patient Position: Sitting, Cuff Size: Normal)   Pulse (!) 53   Ht 5\' 7"  (1.702 m)   Wt 125 lb (56.7 kg)   SpO2 (!) 87%   BMI 19.58 kg/m  GENERAL:  Frail and thin appearing HEENT: Anisocoria, fundi not visualized, oral mucosa unremarkable, mild facial droop NECK:  No jugular venous distention, waveform within normal limits, carotid upstroke brisk and symmetric, right bruit vs transmitted murmur, no  thyromegaly LUNGS:  Clear to auscultation bilaterally HEART:  PMI not displaced or sustained,S1 and S2 within normal limits, no [id number] clicks, no rubs, apical systolic early to mid peaking murmur, irregular ABD:  Flat, positive bowel sounds normal in frequency in pitch, no bruits, no rebound, no guarding, no midline pulsatile mass, no hepatomegaly, no splenomegaly EXT:  2 plus pulses upper and left DP/PT, diminished PT/DP right, mild edema, no cyanosis no clubbing NEURO:  Left facial droop.    ASSESSMENT AND PLAN  ATRIAL FIBRILLATION:  The patient tolerates atrial fibrillation.  She is tolerating the Eliquis.  No change in therapy is planned.   CAROTID STENOSIS:  This is followed by Dr. Leonie Man.  MURMUR:  She has had no change in symptoms or exam. No change in therapy is indicated.  She had mild aortic stenosis on her echo previously.  She has TR secondary to pulm HTN.  No change in therapy  CHRONIC DIASTOLIC HF:  She seems to be euvolemic.  No change in therapy is planned.

## 2016-10-14 ENCOUNTER — Encounter: Payer: Self-pay | Admitting: Cardiology

## 2016-10-14 ENCOUNTER — Ambulatory Visit (INDEPENDENT_AMBULATORY_CARE_PROVIDER_SITE_OTHER): Payer: Medicare Other | Admitting: Cardiology

## 2016-10-14 VITALS — BP 110/70 | HR 53 | Ht 67.0 in | Wt 125.0 lb

## 2016-10-14 DIAGNOSIS — I5032 Chronic diastolic (congestive) heart failure: Secondary | ICD-10-CM | POA: Diagnosis not present

## 2016-10-14 DIAGNOSIS — I482 Chronic atrial fibrillation, unspecified: Secondary | ICD-10-CM

## 2016-10-14 NOTE — Patient Instructions (Signed)
Medication Instructions:  Continue current medications  Labwork: None Ordered  Testing/Procedures: None Ordered  Follow-Up: Your physician wants you to follow-up in: 6 Months with APP. You will receive a reminder letter in the mail two months in advance. If you don't receive a letter, please call our office to schedule the follow-up appointment.   Any Other Special Instructions Will Be Listed Below (If Applicable).   If you need a refill on your cardiac medications before your next appointment, please call your pharmacy.

## 2016-10-15 ENCOUNTER — Encounter: Payer: Self-pay | Admitting: Cardiology

## 2016-10-20 ENCOUNTER — Encounter: Payer: Self-pay | Admitting: Internal Medicine

## 2016-10-23 ENCOUNTER — Other Ambulatory Visit: Payer: Self-pay | Admitting: Internal Medicine

## 2016-11-05 ENCOUNTER — Encounter: Payer: Self-pay | Admitting: Pulmonary Disease

## 2016-11-05 ENCOUNTER — Ambulatory Visit (INDEPENDENT_AMBULATORY_CARE_PROVIDER_SITE_OTHER): Admitting: Pulmonary Disease

## 2016-11-05 DIAGNOSIS — J438 Other emphysema: Secondary | ICD-10-CM | POA: Diagnosis not present

## 2016-11-05 DIAGNOSIS — R0902 Hypoxemia: Secondary | ICD-10-CM | POA: Diagnosis not present

## 2016-11-05 MED ORDER — SODIUM CHLORIDE 3 % IN NEBU: INHALATION_SOLUTION | Freq: Two times a day (BID) | RESPIRATORY_TRACT | 5 refills | Status: AC

## 2016-11-05 NOTE — Assessment & Plan Note (Signed)
See above

## 2016-11-05 NOTE — Progress Notes (Signed)
Subjective:    Patient ID: Kristine Simmons Simmons, female    DOB: 02-05-1927, 82 y.o.   MRN: [id number]  Synopsis: GOLD GRADE A COPD With severe hypoxemia secondary to recurrent aspiration leading to basilar pulmonary scarring. January 2016 pulmonary function testing> ratio 58% FEV1 1.83L (88% pred, no change with BD), TLC 5.78L (103% pred), DLCO 9.01 (31% pred) 03/2015 CT chest> patulous esophagus, basilar scarring She has overnight hypoxemia and uses 4 L continuously.  HPI Chief Complaint  Patient presents with  . Follow-up    pt c/o worsening fatigue, body aches with exertion, sob with exertion.     Kristine Simmons has been struggling with no energy.  She says this has been more of a problem for her lately.  She stays sleepy all the time.  She says that she naps daily and she will feel pretty good for 30 minutes or so, but any effort will make her very tired.  She doesn't necessarily feel short of breath, just fatigued all over.  She says that she will sit down and do deep breathing exercises that help.    She continues to cough when she eats.  She says that she knows that this is coming from the phlegm in her throat.  The amount she can produce is "unbelievable" and very thick and very difficult to produce.  She limits the amount of water she drinks daily.  She says that she is thirsty all the time.  She drinks water (sips) all day but she doesn't particularly drink more fluid when she is eating.   Past Medical History:  Diagnosis Date  . Acute diastolic heart failure ([place]) 11/03/2015  . Aortic stenosis, mild 08/15/2014   Echo June, 2015, mild aortic stenosis   . Atrial fibrillation ([place])    Dr Percival Spanish  . 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 Cardiorenal syndrome 11/05/2015  . CAROTID STENOSIS 08/08/2009   Qualifier: Diagnosis of  By: Percival Spanish, MD, Farrel Gordon    . Cerebrovascular accident ([place]) 01/09  . Chronic anticoagulation 08/15/2014   Eliquis for A fib   . Complication of  anesthesia    difficulty with arousal post op  . COPD, Gold grade A 04/08/2008   January 2016 pulmonary function testing> ratio 58% FEV1 1.83L (88% pred, no change with BD), TLC 5.78L (103% pred), DLCO 9.01 (31% pred) 07/2015 ONO RA > spO2 79% average 07/2015 ONO 2LPM> < 88% 189 min 08/2015 ONO 3LPM < 88% 69.8 min 09/2015 ONO 4LPM > OK   . Cystitis   . Depression 09/22/2014  . Fracture 1995, 2009   RUE; LUE Dr. Durward Fortes  . G E R D 06/02/2007   Qualifier: Diagnosis of  By: Linna Darner MD, Gwyndolyn Saxon    . Hx of colonic polyps    last colonoscopy 2005, repeat was due 2010 Dr. Sharlett Iles  . Hyperlipidemia   . HYPERTENSION, ESSENTIAL NOS 12/01/2007   Qualifier: Diagnosis of  By: Linna Darner MD, Gwyndolyn Saxon    . Hypothyroidism   . Osteoporosis   . Spinal stenosis of lumbar region 01/10/2016  . Squamous cell carcinoma of neck 03/09/2014   3/15 Dr Wilhemina Bonito   . T12 compression fracture ([place]) 11/02/2015      Review of Systems  Constitutional: Negative for chills, fatigue and fever.  HENT: Negative for rhinorrhea, sinus pressure and sneezing.   Respiratory: Positive for cough and shortness of breath. Negative for wheezing.   Cardiovascular: Negative for chest pain, palpitations and leg  swelling.       Objective:   Physical Exam Vitals:   11/05/16 1136  BP: 132/68  Pulse: 72  SpO2: 92%  Weight: 125 lb (56.7 kg)  Height: 5\' 7"  (1.702 m)  5L [place] pulse   Gen: chronically ill appearing, no acute distress HEENT: NCAT, EOMi, OP clear,  PULM: crackles bases, good air movement CV: RRR, systolic murmur, JVD noted AB: BS+, soft, nontender,  Ext: warm, no edema, no clubbing, no cyanosis Derm: no rash or skin breakdown Neuro: A&Ox4, MAEW   CT chest from July 2017 personally reviewed showing bilateral pulmonary edema and pleural effusions Hospital discharge records reviewed from July 2017 when she was placed on hospice.     Assessment & Plan:   COPD, Gold grade A Again, disproportionate chronic respiratory  failure with hypoxemia compared to her airflow obstruction.  Despite the severity of her chronic respiratory failure with hypoxemia she continues to do quite well in the grand scheme of things. I agree completely with her being on hospice but she's remained active.  Lately she's been experiencing debilitating dyspnea which no doubt is due to the severity of her chronic lung disease. In part this is due to chest congestion exacerbated by her dysphasia but also profound deconditioning.  Plan: I'm going to add hypertonic saline twice a day to help her clear mucus from her trachea Continue Symbicort twice a day Continue albuterol as needed I have encouraged her to use morphine as needed for shortness of breath, she's hesitant to consider this but we'll send a message to hospice to help reiterate how helpful this can be with dyspnea I'm going to have her and her husband titrate her oxygen to comfort. They've been quite resistant to making adjustments but their home concentrator will go to 10 L/m have advised that she can increase the dose when she's having increasing shortness of breath.  Greater than 50% of this 20 minute visit spent face-to-face  Follow-up 2 months  Hypoxemia See above   Updated Medication List Outpatient Encounter Prescriptions as of 11/05/2016  Medication Sig  . albuterol (VENTOLIN HFA) 108 (90 BASE) MCG/ACT inhaler Inhale 2 puffs into the lungs every 4 (four) hours as needed for wheezing or shortness of breath.   Marland Kitchen apixaban (ELIQUIS) 2.5 MG TABS tablet Take 2.5 mg by mouth 2 (two) times daily.  Marland Kitchen diltiazem (DILTIAZEM CD) 120 MG 24 hr capsule Take 120 mg by mouth daily.  Marland Kitchen ENSURE PLUS (ENSURE PLUS) LIQD TAKE ONE CAN BY MOUTH ONCE DAILY  . estradiol (ESTRACE) 0.1 MG/GM vaginal cream Place 2 g vaginally 2 (two) times a week. On Wednesdays & Saturdays.  Marland Kitchen ezetimibe-simvastatin (VYTORIN) 10-40 MG tablet Take 1 tablet by mouth daily.  . furosemide (LASIX) 40 MG tablet TAKE 1  TABLET ONCE DAILY.  Marland Kitchen levothyroxine (SYNTHROID, LEVOTHROID) 50 MCG tablet TAKE 1 TABLET ONCE DAILY.  Marland Kitchen OXYGEN Inhale into the lungs continuous. Sitting or resting flow should be on 4 L  And during activities use 5-6 L  . polyethylene glycol powder (GLYCOLAX/MIRALAX) powder Take 17 g by mouth daily. Mix 1 capful of powder and dissolve it in any liquid. Drink once daily.  . Probiotic Product (MISC INTESTINAL FLORA REGULAT) CAPS Take 1 capsule by mouth daily.    . sertraline (ZOLOFT) 25 MG tablet TAKE ONE TABLET EACH DAY WITH 50MG  TO GET TOTAL DOSE OF 75MG .  . sertraline (ZOLOFT) 50 MG tablet TAKE 1 TABLET EACH DAY.  Marland Kitchen Spacer/Aero-Holding Chambers (AEROCHAMBER MV) inhaler  Use as instructed  . SYMBICORT 80-4.5 MCG/ACT inhaler USE 2 PUFFS TWICE DAILY.  Marland Kitchen timolol (TIMOPTIC) 0.5 % ophthalmic solution Place 1 drop into both eyes 2 (two) times daily.   Marland Kitchen trimethoprim (TRIMPEX) 100 MG tablet Take 100 mg by mouth every other day.   . sodium chloride HYPERTONIC 3 % nebulizer solution Take by nebulization 2 (two) times daily.   No facility-administered encounter medications on file as of 11/05/2016.

## 2016-11-05 NOTE — Patient Instructions (Signed)
Use the nebulized hypertonic saline twice a day Titrate oxygen as needed to maintain comfort Continue using Symbicort Consider using morphine as needed for shortness of breath I'll see you back in 2 months

## 2016-11-05 NOTE — Assessment & Plan Note (Signed)
Again, disproportionate chronic respiratory failure with hypoxemia compared to her airflow obstruction.  Despite the severity of her chronic respiratory failure with hypoxemia she continues to do quite well in the grand scheme of things. I agree completely with her being on hospice but she's remained active.  Lately she's been experiencing debilitating dyspnea which no doubt is due to the severity of her chronic lung disease. In part this is due to chest congestion exacerbated by her dysphasia but also profound deconditioning.  Plan: I'm going to add hypertonic saline twice a day to help her clear mucus from her trachea Continue Symbicort twice a day Continue albuterol as needed I have encouraged her to use morphine as needed for shortness of breath, she's hesitant to consider this but we'll send a message to hospice to help reiterate how helpful this can be with dyspnea I'm going to have her and her husband titrate her oxygen to comfort. They've been quite resistant to making adjustments but their home concentrator will go to 10 L/m have advised that she can increase the dose when she's having increasing shortness of breath.  Greater than 50% of this 20 minute visit spent face-to-face  Follow-up 2 months

## 2016-11-18 ENCOUNTER — Ambulatory Visit (INDEPENDENT_AMBULATORY_CARE_PROVIDER_SITE_OTHER): Payer: Medicare Other

## 2016-11-18 ENCOUNTER — Ambulatory Visit (INDEPENDENT_AMBULATORY_CARE_PROVIDER_SITE_OTHER): Payer: Medicare Other | Admitting: Physician Assistant

## 2016-11-18 VITALS — BP 131/78 | HR 105 | Temp 98.8°F | Resp 16 | Ht 67.0 in | Wt 120.0 lb

## 2016-11-18 DIAGNOSIS — Y92009 Unspecified place in unspecified non-institutional (private) residence as the place of occurrence of the external cause: Principal | ICD-10-CM

## 2016-11-18 DIAGNOSIS — Y92099 Unspecified place in other non-institutional residence as the place of occurrence of the external cause: Secondary | ICD-10-CM

## 2016-11-18 DIAGNOSIS — M542 Cervicalgia: Secondary | ICD-10-CM

## 2016-11-18 DIAGNOSIS — S0101XA Laceration without foreign body of scalp, initial encounter: Secondary | ICD-10-CM

## 2016-11-18 DIAGNOSIS — W19XXXA Unspecified fall, initial encounter: Secondary | ICD-10-CM

## 2016-11-18 NOTE — Progress Notes (Signed)
  Subjective:   82 year old lady who lost her balance and fell at home. She went backwards to the left and hit the back of her head. No loss of consciousness. Her husband was there. She called her daughter and hospice and she is in hospice because of her COPD and chronic hypoxemia. She sustained a laceration on the back of her head. This happened a couple of hours ago.  Current allergies, medications, problem list, past/family and social histories reviewed.  Objective:  BP 110/70 (BP Location: Right Arm, Patient Position: Sitting, Cuff Size: Normal)   Pulse 75   Temp 97.8 F (36.6 C) (Oral)   Resp 17   Ht 5\' 4"  (1.626 m)   Wt 117 lb (53.1 kg)   SpO2 92%   BMI 20.08 kg/m   Fully alert and oriented. Has drooping of the left eyelid from an old problem. She does have some pain in her neck. Neck is nontender. Did not put her through major range of motion yet at this time. She has a laceration on the left occipital region probably an inch or so in length though I did not clean it yet well enough to tell exactly what is present under the crusting. It started to bleed when I move the hair around.  Assessment & Plan:   Assessment: 1. Fall at home, initial encounter   2. Cervical pain   3. Laceration of scalp without foreign body, initial encounter     Plan: C-spine

## 2016-11-18 NOTE — Patient Instructions (Signed)
WOUND CARE Please return in 7-10 days to have your stitches/staples removed or sooner if you have concerns. . Keep area clean and dry with dressing in place for 24 hours. . After 24 hours, remove bandage and wash wound gently with mild soap and warm water. When skin is dry, reapply a new bandage. Once wound is no longer draining, may leave wound open to air. . Continue daily cleansing with soap and water until stitches/staples are removed. . Do not apply any ointments or creams to the wound while stitches/staples are in place, as this may cause delayed healing. . Notify the office if you experience any of the following signs of infection: Swelling, redness, pus drainage, streaking, fever >101.0 F . Notify the office if you experience excessive bleeding that does not stop after 15-20 minutes of constant, firm pressure.   

## 2016-11-18 NOTE — Progress Notes (Signed)
Procedure: Verbal consent obtained. Skin was anesthetized with 2 cc Bupivicaine and cleaned with soap and water. Wound was explored, found to be 2 cm in length and full-thickness in depth. No deep structures involved. Laceration was sutured with 4 Prolene sutures: 3 mattress and one simple interrupted. Wound was dressed and wound care discussed.

## 2016-11-20 ENCOUNTER — Telehealth: Payer: Self-pay

## 2016-11-20 ENCOUNTER — Other Ambulatory Visit: Payer: Self-pay | Admitting: Internal Medicine

## 2016-11-20 NOTE — Telephone Encounter (Signed)
Nurse manager w/ hospice [phone]  Requesting verbal order to remove stitches in the patients home, so she does not have to leave.

## 2016-11-21 NOTE — Telephone Encounter (Signed)
Left message with staff to have nurse manager return call

## 2016-11-26 NOTE — Telephone Encounter (Signed)
Spoke with Horris Latino at [facility].  Gave order for stitches to be removed 7-10 days after 11/27 by hospice staff.

## 2016-12-01 ENCOUNTER — Inpatient Hospital Stay (HOSPITAL_COMMUNITY)
Admission: EM | Admit: 2016-12-01 | Discharge: 2016-12-04 | DRG: 535 | Disposition: A | Discharge status: Hospice | Attending: Internal Medicine | Admitting: Internal Medicine

## 2016-12-01 ENCOUNTER — Emergency Department (HOSPITAL_COMMUNITY)

## 2016-12-01 ENCOUNTER — Encounter (HOSPITAL_COMMUNITY): Payer: Self-pay | Admitting: Emergency Medicine

## 2016-12-01 DIAGNOSIS — Z7952 Long term (current) use of systemic steroids: Secondary | ICD-10-CM

## 2016-12-01 DIAGNOSIS — E785 Hyperlipidemia, unspecified: Secondary | ICD-10-CM | POA: Diagnosis present

## 2016-12-01 DIAGNOSIS — Z515 Encounter for palliative care: Secondary | ICD-10-CM | POA: Diagnosis present

## 2016-12-01 DIAGNOSIS — Z96641 Presence of right artificial hip joint: Secondary | ICD-10-CM | POA: Diagnosis present

## 2016-12-01 DIAGNOSIS — I350 Nonrheumatic aortic (valve) stenosis: Secondary | ICD-10-CM | POA: Diagnosis present

## 2016-12-01 DIAGNOSIS — M810 Age-related osteoporosis without current pathological fracture: Secondary | ICD-10-CM | POA: Diagnosis present

## 2016-12-01 DIAGNOSIS — Y92099 Unspecified place in other non-institutional residence as the place of occurrence of the external cause: Secondary | ICD-10-CM

## 2016-12-01 DIAGNOSIS — N189 Chronic kidney disease, unspecified: Secondary | ICD-10-CM | POA: Diagnosis present

## 2016-12-01 DIAGNOSIS — J189 Pneumonia, unspecified organism: Secondary | ICD-10-CM | POA: Diagnosis present

## 2016-12-01 DIAGNOSIS — S72001A Fracture of unspecified part of neck of right femur, initial encounter for closed fracture: Secondary | ICD-10-CM

## 2016-12-01 DIAGNOSIS — I482 Chronic atrial fibrillation, unspecified: Secondary | ICD-10-CM | POA: Diagnosis present

## 2016-12-01 DIAGNOSIS — S72111A Displaced fracture of greater trochanter of right femur, initial encounter for closed fracture: Principal | ICD-10-CM | POA: Diagnosis present

## 2016-12-01 DIAGNOSIS — Z66 Do not resuscitate: Secondary | ICD-10-CM | POA: Diagnosis present

## 2016-12-01 DIAGNOSIS — Z8249 Family history of ischemic heart disease and other diseases of the circulatory system: Secondary | ICD-10-CM

## 2016-12-01 DIAGNOSIS — Z888 Allergy status to other drugs, medicaments and biological substances status: Secondary | ICD-10-CM

## 2016-12-01 DIAGNOSIS — Z8589 Personal history of malignant neoplasm of other organs and systems: Secondary | ICD-10-CM

## 2016-12-01 DIAGNOSIS — I5032 Chronic diastolic (congestive) heart failure: Secondary | ICD-10-CM | POA: Diagnosis present

## 2016-12-01 DIAGNOSIS — H409 Unspecified glaucoma: Secondary | ICD-10-CM | POA: Diagnosis present

## 2016-12-01 DIAGNOSIS — I130 Hypertensive heart and chronic kidney disease with heart failure and stage 1 through stage 4 chronic kidney disease, or unspecified chronic kidney disease: Secondary | ICD-10-CM | POA: Diagnosis present

## 2016-12-01 DIAGNOSIS — M25551 Pain in right hip: Secondary | ICD-10-CM

## 2016-12-01 DIAGNOSIS — Z86010 Personal history of colonic polyps: Secondary | ICD-10-CM

## 2016-12-01 DIAGNOSIS — Z800 Family history of malignant neoplasm of digestive organs: Secondary | ICD-10-CM

## 2016-12-01 DIAGNOSIS — E039 Hypothyroidism, unspecified: Secondary | ICD-10-CM | POA: Diagnosis present

## 2016-12-01 DIAGNOSIS — Y95 Nosocomial condition: Secondary | ICD-10-CM | POA: Diagnosis present

## 2016-12-01 DIAGNOSIS — S72009A Fracture of unspecified part of neck of unspecified femur, initial encounter for closed fracture: Secondary | ICD-10-CM | POA: Diagnosis present

## 2016-12-01 DIAGNOSIS — J441 Chronic obstructive pulmonary disease with (acute) exacerbation: Secondary | ICD-10-CM | POA: Diagnosis present

## 2016-12-01 DIAGNOSIS — Z8673 Personal history of transient ischemic attack (TIA), and cerebral infarction without residual deficits: Secondary | ICD-10-CM

## 2016-12-01 DIAGNOSIS — S72001D Fracture of unspecified part of neck of right femur, subsequent encounter for closed fracture with routine healing: Secondary | ICD-10-CM | POA: Diagnosis not present

## 2016-12-01 DIAGNOSIS — H547 Unspecified visual loss: Secondary | ICD-10-CM | POA: Diagnosis present

## 2016-12-01 DIAGNOSIS — Z96612 Presence of left artificial shoulder joint: Secondary | ICD-10-CM | POA: Diagnosis present

## 2016-12-01 DIAGNOSIS — Z87891 Personal history of nicotine dependence: Secondary | ICD-10-CM

## 2016-12-01 DIAGNOSIS — Z885 Allergy status to narcotic agent status: Secondary | ICD-10-CM

## 2016-12-01 DIAGNOSIS — Z806 Family history of leukemia: Secondary | ICD-10-CM

## 2016-12-01 DIAGNOSIS — Z801 Family history of malignant neoplasm of trachea, bronchus and lung: Secondary | ICD-10-CM

## 2016-12-01 DIAGNOSIS — J9621 Acute and chronic respiratory failure with hypoxia: Secondary | ICD-10-CM | POA: Diagnosis present

## 2016-12-01 DIAGNOSIS — Z79899 Other long term (current) drug therapy: Secondary | ICD-10-CM

## 2016-12-01 DIAGNOSIS — Z803 Family history of malignant neoplasm of breast: Secondary | ICD-10-CM

## 2016-12-01 DIAGNOSIS — Z823 Family history of stroke: Secondary | ICD-10-CM

## 2016-12-01 DIAGNOSIS — Z7901 Long term (current) use of anticoagulants: Secondary | ICD-10-CM

## 2016-12-01 DIAGNOSIS — J440 Chronic obstructive pulmonary disease with acute lower respiratory infection: Secondary | ICD-10-CM | POA: Diagnosis present

## 2016-12-01 DIAGNOSIS — W010XXA Fall on same level from slipping, tripping and stumbling without subsequent striking against object, initial encounter: Secondary | ICD-10-CM | POA: Diagnosis present

## 2016-12-01 DIAGNOSIS — Z833 Family history of diabetes mellitus: Secondary | ICD-10-CM

## 2016-12-01 DIAGNOSIS — I10 Essential (primary) hypertension: Secondary | ICD-10-CM | POA: Diagnosis not present

## 2016-12-01 DIAGNOSIS — Z7951 Long term (current) use of inhaled steroids: Secondary | ICD-10-CM

## 2016-12-01 DIAGNOSIS — Z8041 Family history of malignant neoplasm of ovary: Secondary | ICD-10-CM

## 2016-12-01 DIAGNOSIS — A419 Sepsis, unspecified organism: Secondary | ICD-10-CM

## 2016-12-01 DIAGNOSIS — Z881 Allergy status to other antibiotic agents status: Secondary | ICD-10-CM

## 2016-12-01 DIAGNOSIS — Z9981 Dependence on supplemental oxygen: Secondary | ICD-10-CM

## 2016-12-01 LAB — CBC WITH DIFFERENTIAL/PLATELET
BASOS PCT: 0 %
Basophils Absolute: 0 10*3/uL (ref 0.0–0.1)
Eosinophils Absolute: 0 10*3/uL (ref 0.0–0.7)
Eosinophils Relative: 0 %
HEMATOCRIT: 45.3 % (ref 36.0–46.0)
HEMOGLOBIN: 14.1 g/dL (ref 12.0–15.0)
LYMPHS ABS: 1.9 10*3/uL (ref 0.7–4.0)
Lymphocytes Relative: 14 %
MCH: 27.1 pg (ref 26.0–34.0)
MCHC: 31.1 g/dL (ref 30.0–36.0)
MCV: 86.9 fL (ref 78.0–100.0)
MONO ABS: 1.4 10*3/uL — AB (ref 0.1–1.0)
MONOS PCT: 10 %
NEUTROS ABS: 10.5 10*3/uL — AB (ref 1.7–7.7)
Neutrophils Relative %: 76 %
Platelets: 200 10*3/uL (ref 150–400)
RBC: 5.21 MIL/uL — ABNORMAL HIGH (ref 3.87–5.11)
RDW: 16.5 % — AB (ref 11.5–15.5)
WBC: 13.8 10*3/uL — ABNORMAL HIGH (ref 4.0–10.5)

## 2016-12-01 LAB — COMPREHENSIVE METABOLIC PANEL
ALBUMIN: 3.9 g/dL (ref 3.5–5.0)
ALK PHOS: 41 U/L (ref 38–126)
ALT: 31 U/L (ref 14–54)
ANION GAP: 9 (ref 5–15)
AST: 65 U/L — ABNORMAL HIGH (ref 15–41)
BILIRUBIN TOTAL: 1 mg/dL (ref 0.3–1.2)
BUN: 32 mg/dL — ABNORMAL HIGH (ref 6–20)
CALCIUM: 8.6 mg/dL — AB (ref 8.9–10.3)
CO2: 32 mmol/L (ref 22–32)
Chloride: 99 mmol/L — ABNORMAL LOW (ref 101–111)
Creatinine, Ser: 1.19 mg/dL — ABNORMAL HIGH (ref 0.44–1.00)
GFR, EST AFRICAN AMERICAN: 46 mL/min — AB (ref >60)
GFR, EST NON AFRICAN AMERICAN: 40 mL/min — AB (ref >60)
GLUCOSE: 122 mg/dL — AB (ref 65–99)
POTASSIUM: 3.3 mmol/L — AB (ref 3.5–5.1)
Sodium: 140 mmol/L (ref 135–145)
TOTAL PROTEIN: 6.4 g/dL — AB (ref 6.5–8.1)

## 2016-12-01 LAB — PROTIME-INR
INR: 1.18
PROTHROMBIN TIME: 15 s (ref 11.4–15.2)

## 2016-12-01 LAB — I-STAT CG4 LACTIC ACID, ED: LACTIC ACID, VENOUS: 2.76 mmol/L — AB (ref 0.5–1.9)

## 2016-12-01 LAB — INFLUENZA PANEL BY PCR (TYPE A & B)
Influenza A By PCR: NEGATIVE
Influenza B By PCR: NEGATIVE

## 2016-12-01 LAB — MRSA PCR SCREENING: MRSA BY PCR: NEGATIVE

## 2016-12-01 MED ORDER — ONDANSETRON HCL 4 MG/2ML IJ SOLN
4.0000 mg | Freq: Once | INTRAMUSCULAR | Status: AC
  Administered 2016-12-01: 4 mg via INTRAVENOUS
  Filled 2016-12-01: qty 2

## 2016-12-01 MED ORDER — METHYLPREDNISOLONE SODIUM SUCC 125 MG IJ SOLR
125.0000 mg | Freq: Once | INTRAMUSCULAR | Status: AC
  Administered 2016-12-01: 125 mg via INTRAVENOUS
  Filled 2016-12-01: qty 2

## 2016-12-01 MED ORDER — ATORVASTATIN CALCIUM 10 MG PO TABS
20.0000 mg | ORAL_TABLET | Freq: Every day | ORAL | Status: DC
  Administered 2016-12-02 – 2016-12-04 (×3): 20 mg via ORAL
  Filled 2016-12-01 (×4): qty 2

## 2016-12-01 MED ORDER — SODIUM CHLORIDE 0.9 % IV BOLUS (SEPSIS)
1000.0000 mL | Freq: Once | INTRAVENOUS | Status: AC
  Administered 2016-12-01: 1000 mL via INTRAVENOUS

## 2016-12-01 MED ORDER — LORAZEPAM 0.5 MG PO TABS
0.5000 mg | ORAL_TABLET | Freq: Every evening | ORAL | Status: DC | PRN
  Administered 2016-12-02 – 2016-12-03 (×2): 0.5 mg via ORAL
  Filled 2016-12-01 (×2): qty 1

## 2016-12-01 MED ORDER — SODIUM CHLORIDE 3 % IN NEBU
4.0000 mL | INHALATION_SOLUTION | Freq: Two times a day (BID) | RESPIRATORY_TRACT | Status: DC
  Administered 2016-12-01 – 2016-12-04 (×4): 4 mL via RESPIRATORY_TRACT
  Filled 2016-12-01 (×7): qty 4

## 2016-12-01 MED ORDER — DEXTROSE 5 % IV SOLN
1.0000 g | Freq: Two times a day (BID) | INTRAVENOUS | Status: DC
  Administered 2016-12-01 – 2016-12-03 (×4): 1 g via INTRAVENOUS
  Filled 2016-12-01 (×4): qty 1

## 2016-12-01 MED ORDER — LEVALBUTEROL HCL 0.63 MG/3ML IN NEBU
0.6300 mg | INHALATION_SOLUTION | Freq: Three times a day (TID) | RESPIRATORY_TRACT | Status: DC
  Administered 2016-12-01 – 2016-12-02 (×2): 0.63 mg via RESPIRATORY_TRACT
  Filled 2016-12-01 (×2): qty 3

## 2016-12-01 MED ORDER — DILTIAZEM HCL ER COATED BEADS 120 MG PO CP24
120.0000 mg | ORAL_CAPSULE | Freq: Every day | ORAL | Status: DC
  Administered 2016-12-01 – 2016-12-04 (×4): 120 mg via ORAL
  Filled 2016-12-01 (×4): qty 1

## 2016-12-01 MED ORDER — METHYLPREDNISOLONE SODIUM SUCC 40 MG IJ SOLR
40.0000 mg | Freq: Two times a day (BID) | INTRAMUSCULAR | Status: DC
  Administered 2016-12-01 – 2016-12-03 (×4): 40 mg via INTRAVENOUS
  Filled 2016-12-01 (×4): qty 1

## 2016-12-01 MED ORDER — PIPERACILLIN-TAZOBACTAM 3.375 G IVPB 30 MIN
3.3750 g | INTRAVENOUS | Status: AC
  Administered 2016-12-01: 3.375 g via INTRAVENOUS
  Filled 2016-12-01: qty 50

## 2016-12-01 MED ORDER — LEVALBUTEROL HCL 0.63 MG/3ML IN NEBU
0.6300 mg | INHALATION_SOLUTION | Freq: Four times a day (QID) | RESPIRATORY_TRACT | Status: DC
  Administered 2016-12-01: 0.63 mg via RESPIRATORY_TRACT

## 2016-12-01 MED ORDER — EZETIMIBE 10 MG PO TABS
10.0000 mg | ORAL_TABLET | Freq: Every day | ORAL | Status: DC
  Administered 2016-12-01: 10 mg via ORAL
  Filled 2016-12-01 (×4): qty 1

## 2016-12-01 MED ORDER — MORPHINE SULFATE (PF) 2 MG/ML IV SOLN: 0.5000 mg | INTRAVENOUS | Status: DC | PRN

## 2016-12-01 MED ORDER — ORAL CARE MOUTH RINSE
15.0000 mL | Freq: Two times a day (BID) | OROMUCOSAL | Status: DC
  Administered 2016-12-03 – 2016-12-04 (×3): 15 mL via OROMUCOSAL

## 2016-12-01 MED ORDER — PIPERACILLIN-TAZOBACTAM 3.375 G IVPB: 3.3750 g | Freq: Three times a day (TID) | INTRAVENOUS | Status: DC

## 2016-12-01 MED ORDER — IPRATROPIUM BROMIDE 0.02 % IN SOLN
0.5000 mg | Freq: Three times a day (TID) | RESPIRATORY_TRACT | Status: DC
  Administered 2016-12-01 – 2016-12-02 (×2): 0.5 mg via RESPIRATORY_TRACT
  Filled 2016-12-01 (×2): qty 2.5

## 2016-12-01 MED ORDER — IPRATROPIUM BROMIDE 0.02 % IN SOLN
0.5000 mg | Freq: Four times a day (QID) | RESPIRATORY_TRACT | Status: DC
  Administered 2016-12-01: 0.5 mg via RESPIRATORY_TRACT

## 2016-12-01 MED ORDER — SODIUM CHLORIDE 0.9 % IV SOLN
500.0000 mg | INTRAVENOUS | Status: DC
  Administered 2016-12-02: 500 mg via INTRAVENOUS
  Filled 2016-12-01: qty 500

## 2016-12-01 MED ORDER — FENTANYL CITRATE (PF) 100 MCG/2ML IJ SOLN
12.5000 ug | Freq: Once | INTRAMUSCULAR | Status: AC
  Administered 2016-12-01: 12.5 ug via INTRAVENOUS
  Filled 2016-12-01: qty 2

## 2016-12-01 MED ORDER — DILTIAZEM HCL 25 MG/5ML IV SOLN
5.0000 mg | Freq: Once | INTRAVENOUS | Status: AC
  Administered 2016-12-01: 5 mg via INTRAVENOUS
  Filled 2016-12-01: qty 5

## 2016-12-01 MED ORDER — MOMETASONE FURO-FORMOTEROL FUM 100-5 MCG/ACT IN AERO
2.0000 | INHALATION_SPRAY | Freq: Two times a day (BID) | RESPIRATORY_TRACT | Status: DC
  Administered 2016-12-01 – 2016-12-04 (×3): 2 via RESPIRATORY_TRACT
  Filled 2016-12-01 (×2): qty 8.8

## 2016-12-01 MED ORDER — SERTRALINE HCL 50 MG PO TABS
50.0000 mg | ORAL_TABLET | Freq: Every day | ORAL | Status: DC
  Filled 2016-12-01: qty 1

## 2016-12-01 MED ORDER — TIMOLOL MALEATE 0.5 % OP SOLN
1.0000 [drp] | Freq: Every day | OPHTHALMIC | Status: DC
  Administered 2016-12-01 – 2016-12-04 (×4): 1 [drp] via OPHTHALMIC
  Filled 2016-12-01: qty 5

## 2016-12-01 MED ORDER — SERTRALINE HCL 50 MG PO TABS
75.0000 mg | ORAL_TABLET | Freq: Every day | ORAL | Status: DC
  Administered 2016-12-02 – 2016-12-04 (×3): 75 mg via ORAL
  Filled 2016-12-01 (×2): qty 1
  Filled 2016-12-01: qty 2

## 2016-12-01 MED ORDER — SENNA 8.6 MG PO TABS
1.0000 | ORAL_TABLET | Freq: Two times a day (BID) | ORAL | Status: DC
  Administered 2016-12-02 – 2016-12-04 (×5): 8.6 mg via ORAL
  Filled 2016-12-01 (×5): qty 1

## 2016-12-01 MED ORDER — VANCOMYCIN HCL IN DEXTROSE 1-5 GM/200ML-% IV SOLN
1000.0000 mg | INTRAVENOUS | Status: AC
  Administered 2016-12-01: 1000 mg via INTRAVENOUS

## 2016-12-01 MED ORDER — LEVOTHYROXINE SODIUM 50 MCG PO TABS
50.0000 ug | ORAL_TABLET | Freq: Every day | ORAL | Status: DC
  Administered 2016-12-01 – 2016-12-04 (×4): 50 ug via ORAL
  Filled 2016-12-01 (×4): qty 1

## 2016-12-01 MED ORDER — FENTANYL CITRATE (PF) 100 MCG/2ML IJ SOLN
25.0000 ug | Freq: Once | INTRAMUSCULAR | Status: AC
  Administered 2016-12-01: 25 ug via INTRAVENOUS
  Filled 2016-12-01: qty 2

## 2016-12-01 MED ORDER — EZETIMIBE-SIMVASTATIN 10-40 MG PO TABS: 1.0000 | ORAL_TABLET | Freq: Every day | ORAL | Status: DC

## 2016-12-01 MED ORDER — POLYETHYLENE GLYCOL 3350 17 G PO PACK
17.0000 g | PACK | Freq: Every day | ORAL | Status: DC
  Administered 2016-12-02 – 2016-12-04 (×3): 17 g via ORAL
  Filled 2016-12-01 (×3): qty 1

## 2016-12-01 NOTE — ED Provider Notes (Signed)
[facility] DEPT Provider Note   CSN: [id number] Arrival date & time: 12/01/16  1026     History   Chief Complaint Chief Complaint  Patient presents with  . Fall  . Hip Pain    HPI Kristine Simmons is a 82 y.o. female.  HPI 82 year old female with past medical history of chronic hypoxic respiratory failure on home hospice who presents with right hip pain. Patient has reportedly had increased cough and hypoxia over the last several days and is on outpatient antibiotics for this. Earlier today, while walking, she fell, likely due to generalized weakness. She landed directly onto her right hip. She denies any head pain or head trauma. She expands immediate onset of severe, 10 out of 10, right hip pain that is worse with any movement or palpation. She has been unable to bear weight on it today. She subsequent presents for evaluation. Of note, she also endorses mild acute on chronic shortness of breath. Denies any chest pain. No known fevers. She has had increased sputum production.  Past Medical History:  Diagnosis Date  . Acute diastolic heart failure ([place]) 11/03/2015  . Aortic stenosis, mild 08/15/2014   Echo June, 2015, mild aortic stenosis   . Atrial fibrillation ([place])    Dr Percival Spanish  . 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 Cardiorenal syndrome 11/05/2015  . CAROTID STENOSIS 08/08/2009   Qualifier: Diagnosis of  By: Percival Spanish, MD, Farrel Gordon    . Cerebrovascular accident ([place]) 01/09  . Chronic anticoagulation 08/15/2014   Eliquis for A fib   . Complication of anesthesia    difficulty with arousal post op  . COPD, Gold grade A 04/08/2008   January 2016 pulmonary function testing> ratio 58% FEV1 1.83L (88% pred, no change with BD), TLC 5.78L (103% pred), DLCO 9.01 (31% pred) 07/2015 ONO RA > spO2 79% average 07/2015 ONO 2LPM> < 88% 189 min 08/2015 ONO 3LPM < 88% 69.8 min 09/2015 ONO 4LPM > OK   . Cystitis   . Depression 09/22/2014  . Fracture 1995, 2009   RUE;  LUE Dr. Durward Fortes  . G E R D 06/02/2007   Qualifier: Diagnosis of  By: Linna Darner MD, Gwyndolyn Saxon    . Hx of colonic polyps    last colonoscopy 2005, repeat was due 2010 Dr. Sharlett Iles  . Hyperlipidemia   . HYPERTENSION, ESSENTIAL NOS 12/01/2007   Qualifier: Diagnosis of  By: Linna Darner MD, Gwyndolyn Saxon    . Hypothyroidism   . Osteoporosis   . Spinal stenosis of lumbar region 01/10/2016  . Squamous cell carcinoma of neck 03/09/2014   3/15 Dr Wilhemina Bonito   . T12 compression fracture ([place]) 11/02/2015    Patient Active Problem List   Diagnosis Date Noted  . HCAP (healthcare-associated pneumonia) 12/01/2016  . Hip fracture ([place]) 12/01/2016  . Chronic obstructive pulmonary disease ([place])   . Pleural effusion   . Hypoxia 07/07/2016  . UTI (urinary tract infection) with e coli 06/13/2016  . Pneumonia 06/10/2016  . Moderate to severe pulmonary hypertension 04/04/2016  . Moderate tricuspid regurgitation 04/04/2016  . Spinal stenosis of lumbar region 01/10/2016  . Cardiorenal syndrome 11/05/2015  . Palliative care encounter   . Goals of care, counseling/discussion   . Acute diastolic heart failure ([place]) 11/03/2015  . Acute on chronic respiratory failure with hypoxia ([place]) 11/02/2015  . T12 compression fracture ([place]) 11/02/2015  . Acute respiratory failure with hypoxia ([place]) 11/02/2015  . Aspiration pneumonia ([place]) 04/09/2015  . CAP (  community acquired pneumonia) 04/08/2015  . Dysphagia, pharyngoesophageal phase 03/07/2015  . Recurrent inguinal hernia with obstruction 11/11/2014  . Depression 09/22/2014  . Hypoxemia 09/22/2014  . Aortic stenosis, mild 08/15/2014  . Chronic anticoagulation 08/15/2014  . Recurrent femoral hernia with obstruction without gangrene 08/15/2014  . History of CVA (cerebrovascular accident) 08/15/2014  . Squamous cell carcinoma of neck ([place]) 03/09/2014  . Cough 09/16/2012  . Diverticulosis 09/11/2011  . Glaucoma 09/11/2011  . Carotid arterial disease ([place]) 08/08/2009  . HEARING  LOSS 11/22/2008  . COPD, Gold grade A 04/08/2008  . HERNIA 04/08/2008  . ARTHRITIS 04/08/2008  . VITAMIN D DEFICIENCY 02/01/2008  . Atrial fibrillation ([place]) 02/01/2008  . Essential hypertension 12/01/2007  . OSTEOPOROSIS 12/01/2007  . Hypothyroidism 08/06/2007  . DISEQUILIBRIUM 07/24/2007  . G E R D 06/02/2007  . THYROID NODULE, LEFT 04/10/2007  . Hyperlipidemia 04/10/2007  . COLONIC POLYPS, HX OF 04/10/2007    Past Surgical History:  Procedure Laterality Date  . APPENDECTOMY    . CATARACT EXTRACTION     [place]  . COLONOSCOPY W/ POLYPECTOMY  2005   Diverticulosis;Dr. Sharlett Iles  . EXCISION METACARPAL MASS Right 08/17/2013   Procedure: RIGHT LONG EXCISION MASS AND DIP JOINT DEBRIDEMENT;  Surgeon: Tennis Must, MD;  Location: [facility];  Service: Orthopedics;  Laterality: Right;  . FEMORAL HERNIA REPAIR Right 08/17/2014   Procedure: OPEN REPAIR RIGHT FEMORAL HERNIA  WITH INSERTION OF MESH;  Surgeon: Adin Hector, MD;  Location: [facility];  Service: General;  Laterality: Right;  . INGUINAL HERNIA REPAIR Right 11/15/2014   Procedure: LAPAROSCOPIC RIGHT INGUINAL HERNIA WITH MESH;  Surgeon: Alphonsa Overall, MD;  Location: [facility];  Service: General;  Laterality: Right;  . INSERTION OF MESH  08/17/2014   Procedure: INSERTION OF MESH;  Surgeon: Adin Hector, MD;  Location: [facility];  Service: General;;  . MOHS SURGERY  2013   nasal Basal Cell; Dr Sarajane Jews  . Thyroid Needle Biopsy  2002  . TONSILLECTOMY AND ADENOIDECTOMY    . TOTAL HIP ARTHROPLASTY  2008   CHF & RAF post op   . TOTAL SHOULDER REPLACEMENT  2010   Dr Durward Fortes - left shoulder  . Vocal Cord Polyps      OB History    No data available       Home Medications    Prior to Admission medications   Medication Sig Start Date End Date Taking? Authorizing Provider  acetaminophen (TYLENOL) 500 MG tablet Take 500-1,000 mg by mouth every 6 (six) hours as needed.   Yes Historical Provider, MD  albuterol  (ACCUNEB) 1.25 MG/3ML nebulizer solution Take 1 ampule by nebulization every 6 (six) hours as needed for wheezing.   Yes Historical Provider, MD  albuterol (VENTOLIN HFA) 108 (90 BASE) MCG/ACT inhaler Inhale 2 puffs into the lungs every 4 (four) hours as needed for wheezing or shortness of breath.    Yes Historical Provider, MD  apixaban (ELIQUIS) 2.5 MG TABS tablet Take 2.5 mg by mouth 2 (two) times daily.   Yes Historical Provider, MD  diltiazem (DILTIAZEM CD) 120 MG 24 hr capsule Take 120 mg by mouth daily.   Yes Historical Provider, MD  doxycycline (VIBRAMYCIN) 100 MG capsule Take 100 mg by mouth 2 (two) times daily.   Yes Historical Provider, MD  ENSURE PLUS (ENSURE PLUS) LIQD TAKE ONE CAN BY MOUTH ONCE DAILY 08/07/16  Yes Binnie Rail, MD  estradiol (ESTRACE) 0.1 MG/GM vaginal cream Place 2 g vaginally  2 (two) times a week. On Wednesdays & Saturdays.   Yes Historical Provider, MD  ezetimibe-simvastatin (VYTORIN) 10-40 MG tablet Take 1 tablet by mouth daily.   Yes Historical Provider, MD  furosemide (LASIX) 40 MG tablet TAKE 1 TABLET ONCE DAILY. 11/20/16  Yes Biagio Borg, MD  levothyroxine (SYNTHROID, LEVOTHROID) 50 MCG tablet TAKE 1 TABLET ONCE DAILY. 09/25/16  Yes Binnie Rail, MD  LORazepam (ATIVAN) 0.5 MG tablet Take 0.5 mg by mouth at bedtime as needed for anxiety.   Yes Historical Provider, MD  OXYGEN Inhale into the lungs continuous. Sitting or resting flow should be on 4 L  And during activities use 5-6 L   Yes Historical Provider, MD  polyethylene glycol powder (GLYCOLAX/MIRALAX) powder Take 17 g by mouth daily. Mix 1 capful of powder and dissolve it in any liquid. Drink once daily. 10/30/15  Yes Forde Dandy, MD  predniSONE (DELTASONE) 20 MG tablet Take 40 mg by mouth 2 (two) times daily.   Yes Historical Provider, MD  Probiotic Product (MISC INTESTINAL FLORA REGULAT) CAPS Take 1 capsule by mouth daily.     Yes Historical Provider, MD  sertraline (ZOLOFT) 25 MG tablet TAKE ONE TABLET  EACH DAY WITH 50MG  TO GET TOTAL DOSE OF 75MG . 09/25/16  Yes Binnie Rail, MD  sertraline (ZOLOFT) 50 MG tablet TAKE 1 TABLET EACH DAY. 10/23/16  Yes Biagio Borg, MD  sodium chloride HYPERTONIC 3 % nebulizer solution Take by nebulization 2 (two) times daily. 11/05/16  Yes Juanito Doom, MD  SYMBICORT 80-4.5 MCG/ACT inhaler USE 2 PUFFS TWICE DAILY. 10/02/16  Yes Juanito Doom, MD  timolol (TIMOPTIC) 0.5 % ophthalmic solution Place 1 drop into both eyes daily.    Yes Historical Provider, MD  traMADol (ULTRAM) 50 MG tablet Take 50 mg by mouth every 6 (six) hours as needed.   Yes Historical Provider, MD  trimethoprim (TRIMPEX) 100 MG tablet Take 100 mg by mouth every other day.    Yes Historical Provider, MD  Spacer/Aero-Holding Chambers (AEROCHAMBER MV) inhaler Use as instructed Patient not taking: Reported on 12/01/2016 07/03/16   Magdalen Spatz, NP    Family History Family History  Problem Relation Age of Onset  . Stroke Father     > 73  . Diabetes Father   . Leukemia Mother   . Cancer Mother     leukemia  . Colon cancer Brother     Valve replacement  . Heart attack Brother   . Breast cancer Sister   . Ovarian cancer Sister   . Lung cancer Sister     smoker  . Prostate cancer Brother   . Cancer Brother     bladder    Social History Social History  Substance Use Topics  . Smoking status: Former Smoker    Packs/day: 1.00    Years: 45.00    Types: Cigarettes    Quit date: 12/23/1993  . Smokeless tobacco: Never Used     Comment: Smoked 1950-1995, up to 1 ppd  . Alcohol use No     Allergies   Hydrocodone; Desmopressin acetate; Dorzolamide hcl; Prempro [conj estrog-medroxyprogest ace]; and Ciprofloxacin   Review of Systems Review of Systems  Constitutional: Positive for fatigue. Negative for chills and fever.  HENT: Negative for congestion, rhinorrhea and sore throat.   Eyes: Negative for visual disturbance.  Respiratory: Positive for cough and shortness of breath.  Negative for wheezing.   Cardiovascular: Negative for chest pain and leg swelling.  Gastrointestinal: Negative for abdominal pain, diarrhea, nausea and vomiting.  Genitourinary: Negative for dysuria, flank pain, vaginal bleeding and vaginal discharge.  Musculoskeletal: Positive for gait problem and joint swelling. Negative for neck pain.  Skin: Negative for rash.  Allergic/Immunologic: Negative for immunocompromised state.  Neurological: Positive for weakness. Negative for syncope and headaches.  Hematological: Does not bruise/bleed easily.  All other systems reviewed and are negative.    Physical Exam Updated Vital Signs BP 91/57   Pulse 106   Temp 98.6 F (37 C) (Oral)   Resp 13   SpO2 (!) 88%   Physical Exam  Constitutional: She is oriented to person, place, and time. She appears well-developed.  Chronically ill-appearing  HENT:  Head: Normocephalic and atraumatic.  Eyes: Conjunctivae are normal. Pupils are equal, round, and reactive to light.  Neck: Neck supple.  No cervical spine tenderness  Cardiovascular: Normal rate, regular rhythm and normal heart sounds.  Exam reveals no friction rub.   No murmur heard. Pulmonary/Chest: Effort normal. Tachypnea noted. No respiratory distress. She has decreased breath sounds. She has no wheezes. She has no rhonchi. She has no rales.  Abdominal: Soft. She exhibits no distension. There is no tenderness. There is no guarding.  Musculoskeletal: She exhibits no edema.  Neurological: She is alert and oriented to person, place, and time. She exhibits normal muscle tone.  Skin: Skin is warm. Capillary refill takes less than 2 seconds.  Psychiatric: She has a normal mood and affect.  Nursing note and vitals reviewed.   LOWER EXTREMITY EXAM: RIGHT  INSPECTION & PALPATION: Exquisite tenderness over right greater trochanter. No obvious deformity. Limb is not shortened or rotated.  SENSORY: sensation is intact to light touch in:    Superficial peroneal nerve distribution (over dorsum of foot) Deep peroneal nerve distribution (over first dorsal web space) Sural nerve distribution (over lateral aspect 5th metatarsal) Saphenous nerve distribution (over medial instep)  MOTOR:  + Motor EHL (great toe dorsiflexion) + FHL (great toe plantar flexion)  + TA (ankle dorsiflexion)  + GSC (ankle plantar flexion)  VASCULAR: 2+ dorsalis pedis and posterior tibialis pulses Capillary refill < 2 sec, toes warm and well-perfused  COMPARTMENTS: Soft, warm, well-perfused No pain with passive extension No parethesias   ED Treatments / Results  Labs (all labs ordered are listed, but only abnormal results are displayed) Labs Reviewed  CBC WITH DIFFERENTIAL/PLATELET - Abnormal; Notable for the following:       Result Value   WBC 13.8 (*)    RBC 5.21 (*)    RDW 16.5 (*)    Neutro Abs 10.5 (*)    Monocytes Absolute 1.4 (*)    All other components within normal limits  COMPREHENSIVE METABOLIC PANEL - Abnormal; Notable for the following:    Potassium 3.3 (*)    Chloride 99 (*)    Glucose, Bld 122 (*)    BUN 32 (*)    Creatinine, Ser 1.19 (*)    Calcium 8.6 (*)    Total Protein 6.4 (*)    AST 65 (*)    GFR calc non Af Amer 40 (*)    GFR calc Af Amer 46 (*)    All other components within normal limits  I-STAT CG4 LACTIC ACID, ED - Abnormal; Notable for the following:    Lactic Acid, Venous 2.76 (*)    All other components within normal limits  CULTURE, BLOOD (ROUTINE X 2)  CULTURE, BLOOD (ROUTINE X 2)  CULTURE, EXPECTORATED SPUTUM-ASSESSMENT  GRAM STAIN  URINE CULTURE  MRSA PCR SCREENING  PROTIME-INR  HIV ANTIBODY (ROUTINE TESTING)  STREP PNEUMONIAE URINARY ANTIGEN  INFLUENZA PANEL BY PCR (TYPE A & B, H1N1)  I-STAT CG4 LACTIC ACID, ED    EKG  EKG Interpretation None       Radiology Dg Chest 2 View  Result Date: 12/01/2016 CLINICAL DATA:  [place] on RIGHT hip yesterday, history of RIGHT hip replacement  EXAM: CHEST  2 VIEW COMPARISON:  07/09/2016 FINDINGS: Enlargement of cardiac silhouette with pulmonary vascular congestion. Atherosclerotic calcification aorta. Hazy RIGHT upper lobe infiltrate adjacent to minor fissure. Underlying emphysematous changes. Suspected infiltrate at LEFT base as well. Remaining lungs clear. No pleural effusion or pneumothorax. Diffuse osseous demineralization. RIGHT glenohumeral degenerative changes with prior LEFT shoulder joint replacement. IMPRESSION: Emphysematous changes with suspected RIGHT upper lobe and LEFT basilar infiltrates. Aortic atherosclerosis. Enlargement of cardiac silhouette with pulmonary vascular congestion. Electronically Signed   By: Lavonia Dana M.D.   On: 12/01/2016 12:43   Ct Femur Right Wo Contrast  Result Date: 12/01/2016 CLINICAL DATA:  Fall today. Abnormal hip radiograph indicating fracture. EXAM: CT OF THE LOWER RIGHT EXTREMITY WITHOUT CONTRAST TECHNIQUE: Multidetector CT imaging of the right lower extremity was performed according to the standard protocol. COMPARISON:  12/01/2016 FINDINGS: Oblique fracture through the base of the greater trochanter, with 2.8 cm proximal retraction. The resulting fragment measures 4.6 cm craniocaudad by 2.8 cm transverse by 4.4 cm anterior-posterior and has some minimal comminution. Approximately 14 cm of the stem is still imbedded in the shaft. I do not see a lesser trochanter fracture. No regional pelvic fracture or other fracture is identified. Osteoarthritis of the knee on the right side. IMPRESSION: 1. Fracture the greater trochanter of the right hip with about 2.7 cm proximal retraction of the proximal fragment, which is mildly comminuted. This strips some of the bone away from the proximal portion of the stem although 14 cm of the stem is still imbedded in the shaft of the femur. I do not see definite lesser trochanteric involvement or definite involvement of the medial bony margin of the proximal stem. 2. Right  knee osteoarthritis. Electronically Signed   By: Van Clines M.D.   On: 12/01/2016 16:16   Dg Hip Unilat W Or Wo Pelvis 2-3 Views Right  Result Date: 12/01/2016 CLINICAL DATA:  Fall EXAM: DG HIP (WITH OR WITHOUT PELVIS) 2-3V RIGHT COMPARISON:  None FINDINGS: Diffuse osseous demineralization. Components of RIGHT hip prosthesis identified. Acute fracture seen at the proximal RIGHT femur and extending from the greater trochanter into the intertrochanteric region. Proximal femoral diaphysis appears intact. No dislocation of the RIGHT hip prosthesis. Minimal degenerative changes of LEFT hip joint and SI joints. Pelvis intact. IMPRESSION: RIGHT hip prosthesis with acute mildly displaced fracture involving the greater trochanter extending into the intertrochanteric region and involving the bones surrounding the proximal portion of the femoral component of the RIGHT hip prosthesis. Electronically Signed   By: Lavonia Dana M.D.   On: 12/01/2016 12:45    Procedures .Critical Care Performed by: Duffy Bruce Authorized by: Duffy Bruce   Critical care provider statement:    Critical care time (minutes):  35   Critical care time was exclusive of:  Separately billable procedures and treating other patients   Critical care was necessary to treat or prevent imminent or life-threatening deterioration of the following conditions:  Cardiac failure, circulatory failure, respiratory failure and sepsis   Critical care was time spent personally by me on the following activities:  Development of treatment plan with patient or surrogate, discussions with consultants, ordering and performing treatments and interventions, ordering and review of laboratory studies, ordering and review of radiographic studies, pulse oximetry, evaluation of patient's response to treatment, examination of patient, obtaining history from patient or surrogate, re-evaluation of patient's condition and review of old charts   (including  critical care time)  Medications Ordered in ED Medications  vancomycin (VANCOCIN) 500 mg in sodium chloride 0.9 % 100 mL IVPB (not administered)  LORazepam (ATIVAN) tablet 0.5 mg (not administered)  sodium chloride HYPERTONIC 3 % nebulizer solution 4 mL (not administered)  sertraline (ZOLOFT) tablet 50 mg (not administered)  mometasone-formoterol (DULERA) 100-5 MCG/ACT inhaler 2 puff (not administered)  levothyroxine (SYNTHROID, LEVOTHROID) tablet 50 mcg (not administered)  sertraline (ZOLOFT) tablet 75 mg (not administered)  diltiazem (CARDIZEM CD) 24 hr capsule 120 mg (not administered)  polyethylene glycol (MIRALAX / GLYCOLAX) packet 17 g (not administered)  timolol (TIMOPTIC) 0.5 % ophthalmic solution 1 drop (not administered)  ceFEPIme (MAXIPIME) 1 g in dextrose 5 % 50 mL IVPB (not administered)  morphine 2 MG/ML injection 0.5 mg (not administered)  senna (SENOKOT) tablet 8.6 mg (not administered)  ezetimibe (ZETIA) tablet 10 mg (not administered)    Or  atorvastatin (LIPITOR) tablet 20 mg (not administered)  levalbuterol (XOPENEX) nebulizer solution 0.63 mg (not administered)  methylPREDNISolone sodium succinate (SOLU-MEDROL) 40 mg/mL injection 40 mg (not administered)  ipratropium (ATROVENT) nebulizer solution 0.5 mg (not administered)  fentaNYL (SUBLIMAZE) injection 25 mcg (25 mcg Intravenous Given 12/01/16 1128)  ondansetron (ZOFRAN) injection 4 mg (4 mg Intravenous Given 12/01/16 1128)  sodium chloride 0.9 % bolus 1,000 mL (0 mLs Intravenous Stopped 12/01/16 1459)  methylPREDNISolone sodium succinate (SOLU-MEDROL) 125 mg/2 mL injection 125 mg (125 mg Intravenous Given 12/01/16 1334)  piperacillin-tazobactam (ZOSYN) IVPB 3.375 g (0 g Intravenous Stopped 12/01/16 1433)  vancomycin (VANCOCIN) IVPB 1000 mg/200 mL premix (0 mg Intravenous Stopped 12/01/16 1459)  diltiazem (CARDIZEM) injection 5 mg (5 mg Intravenous Given 12/01/16 1400)  sodium chloride 0.9 % bolus 1,000 mL (1,000  mLs Intravenous New Bag/Given 12/01/16 1526)  fentaNYL (SUBLIMAZE) injection 12.5 mcg (12.5 mcg Intravenous Given 12/01/16 1525)     Initial Impression / Assessment and Plan / ED Course  I have reviewed the triage vital signs and the nursing notes.  Pertinent labs & imaging results that were available during my care of the patient were reviewed by me and considered in my medical decision making (see chart for details).  Clinical Course     82 yo F with PMHx as above here with right hip pain s/p fall, likely 2/2 generalized weakness from acute on chornic hypoxic resp failure 2/2 COPD with possible PNA. ON arrival, pt tachycardic but HDS. Given that pt is hospice pt, discussed further work-up and management with family in detail - will obtain initial plain films and re-assess based on shared decision making with family.  Labs, imaging as above. Initial plain films show HAP as well as R hip fx. D/w [facility] who will assess. D/w family who would like full supportive care at this time. Will start IVF, IV ABX, and admit to SDU. Pt has lactate <4 and I am hesitant to significantly fluid overload with 30 cc/kg given DNR status, so feel this is contraindicated. Moreover, do not feel pt is in septic shock. Pt placed on Venturi mask. Pt in agreement with plan. Pain controlled.  Final Clinical Impressions(s) / ED Diagnoses   Final diagnoses:  Right  hip pain  HAP (hospital-acquired pneumonia)  Acute on chronic respiratory failure with hypoxia (HCC)  Closed fracture of right hip, initial encounter ([place])  Sepsis, due to unspecified organism [facility])  Chronic atrial fibrillation (HCC)      Duffy Bruce, MD 12/02/16 [phone]

## 2016-12-01 NOTE — ED Notes (Signed)
Patient transported to CT 

## 2016-12-01 NOTE — Progress Notes (Signed)
Pharmacy Antibiotic Note  Kristine Simmons is a 82 y.o. female, Hospice resident at [facility] admitted on 12/01/2016 after falling/tripping over doorway, and with suspected pneumonia.  PTA medications include Doxycycline and Prednisone.  Pharmacy has been consulted for Vancomycin and Zosyn dosing.  Afebrile, WBC 13.8 (prednisone), and SCr 1.19 with CrCl ~ 28 ml/min CG  Plan:  Zosyn 3.375g stat x1 over 30 min, then 3.375g IV Q8H infused over 4hrs.  Vancomycin 1g IV x1 stat, then 500 mg IV q24h.  Measure Vanc trough at steady state.  Follow up renal fxn, culture results, and clinical course.      Temp (24hrs), Avg:98.6 F (37 C), Min:98.6 F (37 C), Max:98.6 F (37 C)  No results for input(s): WBC, CREATININE, LATICACIDVEN, VANCOTROUGH, VANCOPEAK, VANCORANDOM, GENTTROUGH, GENTPEAK, GENTRANDOM, TOBRATROUGH, TOBRAPEAK, TOBRARND, AMIKACINPEAK, AMIKACINTROU, AMIKACIN in the last 168 hours.  CrCl cannot be calculated (Patient's most recent lab result is older than the maximum 21 days allowed.).    Allergies  Allergen Reactions  . Hydrocodone Nausea And Vomiting  . Desmopressin Acetate     REACTION: swelling feet  . Dorzolamide Hcl Other (See Comments)    Burning in eyes and redness  . Prempro [Conj Estrog-Medroxyprogest Ace]     unknown  . Ciprofloxacin     REACTION: vaginal infection    Antimicrobials this admission: 12/10 Vancomycin >>  12/10 Zosyn >>   Dose adjustments this admission:  Microbiology results: 12/10 BCx: sent   Thank you for allowing pharmacy to be a part of this patient's care.  Gretta Arab PharmD, BCPS Pager [phone] 12/01/2016 1:27 PM

## 2016-12-01 NOTE — H&P (Signed)
History and Physical        Hospital Admission Note Date: 12/01/2016  Patient name: Kristine Simmons Medical record number: [id number] Date of birth: 15-Aug-1927 Age: 82 y.o. Gender: female  PCP: Binnie Rail, MD   Referring physician: Dr Ellender Hose  Patient coming from: [facility]   Chief Complaint:  Shortness of breath and fall   HPI: Patient is a 82 year old female with diastolic CHF, aortic stenosis, atrial fibrillation, COPD, osteoporosis, hypothyroidism presented from [facility] after a fall. History was obtained from the patient and her family at bedside. Patient is currently in hospice for chronic respiratory failure for one year. Patient had a mechanical fall today after tripping over a door entry and landed on her right hip. She denied any loss of consciousness, syncopal episode. The patient's family, she was also having cough and congestion for last 3 days and was started on antibiotics, prednisone by hospice nurse. They do not know if she had any fevers or chills or any chest pain.  ED work-up/course:  Temp 98.6 S2 rate 14, heart rate 113 BP 91/57, O2 sats 85% on 6 L O2 via nasal cannula Sodium 140, potassium 3.3, BUN 32, creatinine 1.1, lactic acid 2.76 WBC is 13.8, hemoglobin 14.1, hematocrit 45.3 Chest x-ray showed emphysematous changes with suspected right upper lobe and left basilar infiltrates, enlargement of cardiac silhouette with pulmonary vascular congestion CT femur right showed fracture of the greater trochanter of the right hip with about 2.7 cm proximal retraction of the proximal fragment which is mildly comminuted  Review of Systems: Positives marked in 'bold' Constitutional: Denies fever, chills, diaphoresis, poor appetite and fatigue.  HEENT: Denies photophobia, eye pain, redness, hearing loss, ear pain,  congestion, sore throat, rhinorrhea, sneezing, mouth sores, trouble swallowing, neck pain, neck stiffness and tinnitus.   Respiratory: Please see history of present illness Cardiovascular: Denies chest pain, palpitations and leg swelling.  Gastrointestinal: Denies nausea, vomiting, abdominal pain, diarrhea, constipation, blood in stool and abdominal distention.  Genitourinary: Denies dysuria, urgency, frequency, hematuria, flank pain and difficulty urinating.  Musculoskeletal: Denies myalgias, back pain, joint swelling, arthralgias and gait problem. + mechanical fall with right hip pain Skin: Denies pallor, rash and wound.  Neurological: Denies dizziness, seizures, syncope, weakness, light-headedness, numbness and headaches.  Hematological: Denies adenopathy. Easy bruising, personal or family bleeding history  Psychiatric/Behavioral: Denies suicidal ideation, mood changes, confusion, nervousness, sleep disturbance and agitation  Past Medical History: Past Medical History:  Diagnosis Date  . Acute diastolic heart failure ([place]) 11/03/2015  . Aortic stenosis, mild 08/15/2014   Echo June, 2015, mild aortic stenosis   . Atrial fibrillation ([place])    Dr Percival Spanish  . 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 Cardiorenal syndrome 11/05/2015  . CAROTID STENOSIS 08/08/2009   Qualifier: Diagnosis of  By: Percival Spanish, MD, Farrel Gordon    . Cerebrovascular accident ([place]) 01/09  . Chronic anticoagulation 08/15/2014   Eliquis for A fib   . Complication of anesthesia    difficulty with arousal post op  . COPD, Gold grade A 04/08/2008   January 2016 pulmonary function testing> ratio 58% FEV1 1.83L (  88% pred, no change with BD), TLC 5.78L (103% pred), DLCO 9.01 (31% pred) 07/2015 ONO RA > spO2 79% average 07/2015 ONO 2LPM> < 88% 189 min 08/2015 ONO 3LPM < 88% 69.8 min 09/2015 ONO 4LPM > OK   . Cystitis   . Depression 09/22/2014  . Fracture 1995, 2009   RUE; LUE Dr. Durward Fortes  . G E R D 06/02/2007    Qualifier: Diagnosis of  By: Linna Darner MD, Gwyndolyn Saxon    . Hx of colonic polyps    last colonoscopy 2005, repeat was due 2010 Dr. Sharlett Iles  . Hyperlipidemia   . HYPERTENSION, ESSENTIAL NOS 12/01/2007   Qualifier: Diagnosis of  By: Linna Darner MD, Gwyndolyn Saxon    . Hypothyroidism   . Osteoporosis   . Spinal stenosis of lumbar region 01/10/2016  . Squamous cell carcinoma of neck 03/09/2014   3/15 Dr Wilhemina Bonito   . T12 compression fracture ([place]) 11/02/2015    Past Surgical History:  Procedure Laterality Date  . APPENDECTOMY    . CATARACT EXTRACTION     [facility]  . COLONOSCOPY W/ POLYPECTOMY  2005   Diverticulosis;Dr. Sharlett Iles  . EXCISION METACARPAL MASS Right 08/17/2013   Procedure: RIGHT LONG EXCISION MASS AND DIP JOINT DEBRIDEMENT;  Surgeon: Tennis Must, MD;  Location: [facility];  Service: Orthopedics;  Laterality: Right;  . FEMORAL HERNIA REPAIR Right 08/17/2014   Procedure: OPEN REPAIR RIGHT FEMORAL HERNIA  WITH INSERTION OF MESH;  Surgeon: Adin Hector, MD;  Location: [facility];  Service: General;  Laterality: Right;  . INGUINAL HERNIA REPAIR Right 11/15/2014   Procedure: LAPAROSCOPIC RIGHT INGUINAL HERNIA WITH MESH;  Surgeon: Alphonsa Overall, MD;  Location: [facility];  Service: General;  Laterality: Right;  . INSERTION OF MESH  08/17/2014   Procedure: INSERTION OF MESH;  Surgeon: Adin Hector, MD;  Location: [facility];  Service: General;;  . MOHS SURGERY  2013   nasal Basal Cell; Dr Sarajane Jews  . Thyroid Needle Biopsy  2002  . TONSILLECTOMY AND ADENOIDECTOMY    . TOTAL HIP ARTHROPLASTY  2008   CHF & RAF post op   . TOTAL SHOULDER REPLACEMENT  2010   Dr Durward Fortes - left shoulder  . Vocal Cord Polyps      Medications: Prior to Admission medications   Medication Sig Start Date End Date Taking? Authorizing Provider  acetaminophen (TYLENOL) 500 MG tablet Take 500-1,000 mg by mouth every 6 (six) hours as needed.   Yes Historical Provider, MD  albuterol (ACCUNEB) 1.25 MG/3ML nebulizer  solution Take 1 ampule by nebulization every 6 (six) hours as needed for wheezing.   Yes Historical Provider, MD  albuterol (VENTOLIN HFA) 108 (90 BASE) MCG/ACT inhaler Inhale 2 puffs into the lungs every 4 (four) hours as needed for wheezing or shortness of breath.    Yes Historical Provider, MD  apixaban (ELIQUIS) 2.5 MG TABS tablet Take 2.5 mg by mouth 2 (two) times daily.   Yes Historical Provider, MD  diltiazem (DILTIAZEM CD) 120 MG 24 hr capsule Take 120 mg by mouth daily.   Yes Historical Provider, MD  doxycycline (VIBRAMYCIN) 100 MG capsule Take 100 mg by mouth 2 (two) times daily.   Yes Historical Provider, MD  ENSURE PLUS (ENSURE PLUS) LIQD TAKE ONE CAN BY MOUTH ONCE DAILY 08/07/16  Yes Binnie Rail, MD  estradiol (ESTRACE) 0.1 MG/GM vaginal cream Place 2 g vaginally 2 (two) times a week. On Wednesdays & Saturdays.   Yes Historical Provider, MD  ezetimibe-simvastatin (  VYTORIN) 10-40 MG tablet Take 1 tablet by mouth daily.   Yes Historical Provider, MD  furosemide (LASIX) 40 MG tablet TAKE 1 TABLET ONCE DAILY. 11/20/16  Yes Biagio Borg, MD  levothyroxine (SYNTHROID, LEVOTHROID) 50 MCG tablet TAKE 1 TABLET ONCE DAILY. 09/25/16  Yes Binnie Rail, MD  LORazepam (ATIVAN) 0.5 MG tablet Take 0.5 mg by mouth at bedtime as needed for anxiety.   Yes Historical Provider, MD  OXYGEN Inhale into the lungs continuous. Sitting or resting flow should be on 4 L  And during activities use 5-6 L   Yes Historical Provider, MD  polyethylene glycol powder (GLYCOLAX/MIRALAX) powder Take 17 g by mouth daily. Mix 1 capful of powder and dissolve it in any liquid. Drink once daily. 10/30/15  Yes Forde Dandy, MD  predniSONE (DELTASONE) 20 MG tablet Take 40 mg by mouth 2 (two) times daily.   Yes Historical Provider, MD  Probiotic Product (MISC INTESTINAL FLORA REGULAT) CAPS Take 1 capsule by mouth daily.     Yes Historical Provider, MD  sertraline (ZOLOFT) 25 MG tablet TAKE ONE TABLET EACH DAY WITH 50MG  TO GET TOTAL  DOSE OF 75MG . 09/25/16  Yes Binnie Rail, MD  sertraline (ZOLOFT) 50 MG tablet TAKE 1 TABLET EACH DAY. 10/23/16  Yes Biagio Borg, MD  sodium chloride HYPERTONIC 3 % nebulizer solution Take by nebulization 2 (two) times daily. 11/05/16  Yes Juanito Doom, MD  SYMBICORT 80-4.5 MCG/ACT inhaler USE 2 PUFFS TWICE DAILY. 10/02/16  Yes Juanito Doom, MD  timolol (TIMOPTIC) 0.5 % ophthalmic solution Place 1 drop into both eyes daily.    Yes Historical Provider, MD  traMADol (ULTRAM) 50 MG tablet Take 50 mg by mouth every 6 (six) hours as needed.   Yes Historical Provider, MD  trimethoprim (TRIMPEX) 100 MG tablet Take 100 mg by mouth every other day.    Yes Historical Provider, MD  Spacer/Aero-Holding Chambers (AEROCHAMBER MV) inhaler Use as instructed Patient not taking: Reported on 12/01/2016 07/03/16   Magdalen Spatz, NP    Allergies:   Allergies  Allergen Reactions  . Hydrocodone Nausea And Vomiting  . Desmopressin Acetate     REACTION: swelling feet  . Dorzolamide Hcl Other (See Comments)    Burning in eyes and redness  . Prempro [Conj Estrog-Medroxyprogest Ace]     unknown  . Ciprofloxacin     REACTION: vaginal infection    Social History:  reports that she quit smoking about 22 years ago. Her smoking use included Cigarettes. She has a 45.00 pack-year smoking history. She has never used smokeless tobacco. She reports that she does not drink alcohol or use drugs.  Family History: Family History  Problem Relation Age of Onset  . Stroke Father     > 43  . Diabetes Father   . Leukemia Mother   . Cancer Mother     leukemia  . Colon cancer Brother     Valve replacement  . Heart attack Brother   . Breast cancer Sister   . Ovarian cancer Sister   . Lung cancer Sister     smoker  . Prostate cancer Brother   . Cancer Brother     bladder    Physical Exam: Blood pressure 91/57, pulse 106, temperature 98.6 F (37 C), temperature source Oral, resp. rate 13, SpO2 (!) 88  %. General: Alert, awake, oriented x3, in no acute distress. HEENT: normocephalic, atraumatic, anicteric sclera, pink conjunctiva, pupils equal and reactive to  light and accomodation, oropharynx clear Neck: supple, no masses or lymphadenopathy, no goiter, no bruits  Heart: Regular rate and rhythm, without murmurs, rubs or gallops. Lungs: Coarse breath sounds bilaterally Abdomen: Soft, nontender, nondistended, positive bowel sounds, no masses. Extremities: No clubbing, cyanosis or edema with positive pedal pulses. Neuro: Grossly intact, no focal neurological deficits, strength 5/5 upper and lower extremities LUE, LLE. Difficult to assess right lower extremity Psych: alert and oriented x 3, normal mood and affect Skin: no rashes or lesions, warm and dry   LABS on Admission:  Basic Metabolic Panel:  Recent Labs Lab 12/01/16 1355  NA 140  K 3.3*  CL 99*  CO2 32  GLUCOSE 122*  BUN 32*  CREATININE 1.19*  CALCIUM 8.6*   Liver Function Tests:  Recent Labs Lab 12/01/16 1355  AST 65*  ALT 31  ALKPHOS 41  BILITOT 1.0  PROT 6.4*  ALBUMIN 3.9   No results for input(s): LIPASE, AMYLASE in the last 168 hours. No results for input(s): AMMONIA in the last 168 hours. CBC:  Recent Labs Lab 12/01/16 1355  WBC 13.8*  NEUTROABS 10.5*  HGB 14.1  HCT 45.3  MCV 86.9  PLT 200   Cardiac Enzymes: No results for input(s): CKTOTAL, CKMB, CKMBINDEX, TROPONINI in the last 168 hours. BNP: Invalid input(s): POCBNP CBG: No results for input(s): GLUCAP in the last 168 hours.  Radiological Exams on Admission:  No results found.  *I have personally reviewed the images above*  EKG: Independently reviewed.Rate 123, atrial fibrillation and atrial fibrillation   Assessment/Plan Principal Problem:   Acute on chronic respiratory failure with hypoxia (HCC) secondary to healthcare associated pneumonia - Placed on pneumonia protocol, placed on IV vancomycin and cefepime for HCAP - Obtain  urine legionella antigen, urine strep antigen, influenza panel, sputum cultures, blood cultures -Placed on Xopenex, Atrovent, Solu-Medrol   Active Problems:   Hypothyroidism - Continue Synthroid    Essential hypertension -  continue Cardizem   Right  Hip fracture (HCC) - NPO for now until orthopedics evaluation, EDP consulting - Hold eliquis  - High risk for surgery given acute on chronic respiratory failure, on 6 L O2, pneumonia  Chronic atrial fibrillation - Currently rate elevated due to #1, restart Cardizem - Hold eliquis if orthopedic surgery planned  DVT prophylaxis: holding eliquis  CODE STATUS: dnr, dni   Consults called: edp called ortho  Family Communication: Admission, patients condition and plan of care including tests being ordered have been discussed with the patient and son, daughter who indicates understanding and agree with the plan and Code Status  Admission status: SDU  Disposition plan: Further plan will depend as patient's clinical course evolves and further radiologic and laboratory data become available.   At the time of admission, it appears that the appropriate admission status for this patient is INPATIENT . This is judged to be reasonable and necessary in order to provide the required intensity of service to ensure the patient's safety given the presenting symptoms, physical exam findings, and initial radiographic and laboratory data in the context of their chronic comorbidities.      Time Spent on Admission: 76mins   Islam Eichinger M.D. [facility] 12/01/2016, 4:20 PM Pager: [phone]  If 7PM-7AM, please contact night-coverage [email or web address] Password [email or web address]

## 2016-12-01 NOTE — ED Triage Notes (Signed)
Patient here from [facility] with complaints of fall today after tripping over door entry. Hospice patient for respiratory failure. Normally sats 85-87% on 6L.Pain 10/10 to right hip. Reports hx of right hip replacement.

## 2016-12-01 NOTE — ED Notes (Signed)
Recruitment consultant called. Pt to be taken up in approx. 35-19mins.

## 2016-12-02 ENCOUNTER — Telehealth: Payer: Self-pay | Admitting: Pulmonary Disease

## 2016-12-02 DIAGNOSIS — S72001D Fracture of unspecified part of neck of right femur, subsequent encounter for closed fracture with routine healing: Secondary | ICD-10-CM

## 2016-12-02 DIAGNOSIS — Z515 Encounter for palliative care: Secondary | ICD-10-CM

## 2016-12-02 DIAGNOSIS — I482 Chronic atrial fibrillation, unspecified: Secondary | ICD-10-CM | POA: Diagnosis present

## 2016-12-02 DIAGNOSIS — Z66 Do not resuscitate: Secondary | ICD-10-CM | POA: Diagnosis present

## 2016-12-02 LAB — CBC
HEMATOCRIT: 42.5 % (ref 36.0–46.0)
Hemoglobin: 13.1 g/dL (ref 12.0–15.0)
MCH: 27 pg (ref 26.0–34.0)
MCHC: 30.8 g/dL (ref 30.0–36.0)
MCV: 87.6 fL (ref 78.0–100.0)
Platelets: 191 10*3/uL (ref 150–400)
RBC: 4.85 MIL/uL (ref 3.87–5.11)
RDW: 16.7 % — ABNORMAL HIGH (ref 11.5–15.5)
WBC: 10.8 10*3/uL — ABNORMAL HIGH (ref 4.0–10.5)

## 2016-12-02 LAB — BASIC METABOLIC PANEL
ANION GAP: 10 (ref 5–15)
BUN: 35 mg/dL — ABNORMAL HIGH (ref 6–20)
CHLORIDE: 101 mmol/L (ref 101–111)
CO2: 30 mmol/L (ref 22–32)
Calcium: 8.2 mg/dL — ABNORMAL LOW (ref 8.9–10.3)
Creatinine, Ser: 1.17 mg/dL — ABNORMAL HIGH (ref 0.44–1.00)
GFR calc Af Amer: 47 mL/min — ABNORMAL LOW (ref >60)
GFR calc non Af Amer: 41 mL/min — ABNORMAL LOW (ref >60)
GLUCOSE: 125 mg/dL — AB (ref 65–99)
POTASSIUM: 3.9 mmol/L (ref 3.5–5.1)
Sodium: 141 mmol/L (ref 135–145)

## 2016-12-02 LAB — HIV ANTIBODY (ROUTINE TESTING W REFLEX): HIV Screen 4th Generation wRfx: NONREACTIVE

## 2016-12-02 LAB — LACTIC ACID, PLASMA: Lactic Acid, Venous: 1.8 mmol/L (ref 0.5–1.9)

## 2016-12-02 MED ORDER — MORPHINE SULFATE (PF) 2 MG/ML IV SOLN: 0.5000 mg | INTRAVENOUS | Status: DC | PRN

## 2016-12-02 MED ORDER — DILTIAZEM HCL 25 MG/5ML IV SOLN
10.0000 mg | Freq: Once | INTRAVENOUS | Status: AC
  Administered 2016-12-02: 10 mg via INTRAVENOUS
  Filled 2016-12-02: qty 5

## 2016-12-02 MED ORDER — LEVALBUTEROL HCL 0.63 MG/3ML IN NEBU: 0.6300 mg | INHALATION_SOLUTION | Freq: Three times a day (TID) | RESPIRATORY_TRACT | Status: DC | PRN

## 2016-12-02 MED ORDER — ENSURE ENLIVE PO LIQD
237.0000 mL | Freq: Two times a day (BID) | ORAL | Status: DC
  Administered 2016-12-02 – 2016-12-04 (×4): 237 mL via ORAL

## 2016-12-02 MED ORDER — DILTIAZEM HCL 25 MG/5ML IV SOLN
15.0000 mg | Freq: Once | INTRAVENOUS | Status: AC
  Administered 2016-12-02: 15 mg via INTRAVENOUS
  Filled 2016-12-02: qty 5

## 2016-12-02 MED ORDER — ENSURE PLUS PO LIQD: 1.0000 | Freq: Two times a day (BID) | ORAL | Status: DC

## 2016-12-02 MED ORDER — IPRATROPIUM BROMIDE 0.02 % IN SOLN
0.5000 mg | Freq: Three times a day (TID) | RESPIRATORY_TRACT | Status: DC | PRN
Start: 2016-12-02 — End: 2016-12-04

## 2016-12-02 MED ORDER — DILTIAZEM HCL 25 MG/5ML IV SOLN
10.0000 mg | Freq: Once | INTRAVENOUS | Status: DC
  Filled 2016-12-02: qty 5

## 2016-12-02 NOTE — Progress Notes (Signed)
PROGRESS NOTE    Kristine Simmons  [id number]  DOB: 12-Oct-1927  DOA: 12/01/2016 PCP: Binnie Rail, MD Outpatient Specialists:  Hospital course: Patient is a 82 year old female with diastolic CHF, aortic stenosis, atrial fibrillation, COPD, osteoporosis, hypothyroidism presented from [facility] after a fall. History was obtained from the patient and her family at bedside. Patient is currently in hospice for chronic respiratory failure for one year. Patient had a mechanical fall today after tripping over a door entry and landed on her right hip. She denied any loss of consciousness, syncopal episode. The patient's family, she was also having cough and congestion for last 3 days and was started on antibiotics, prednisone by hospice nurse. They do not know if she had any fevers or chills or any chest pain.  She was found to have an acute hip fracture.  She was admitted to SDU.   Assessment & Plan:    Acute on chronic respiratory failure with hypoxia secondary to COPD and possible HCAP - Placed on pneumonia protocol, placed on IV vancomycin and cefepime for HCAP - Follow urine legionella antigen, urine strep antigen, influenza panel, sputum cultures, blood cultures -Placed on Xopenex, Atrovent, Solu-Medrol    Hypothyroidism - Continue Synthroid    Essential hypertension -  continue Cardizem   Right  Hip fracture (HCC) - NPO for now until orthopedics evaluation, ED consulted [facility] - Hold eliquis  - High risk for surgery given acute on chronic respiratory failure, on 6 L O2, pneumonia, if goes to surgery will need clearance from Dr Lake Bells her pulmonologist.  Chronic atrial fibrillation - Currently rate elevated due to #1, restart Cardizem - Hold eliquis if orthopedic surgery planned - IV diltiazem given as needed.  DVT prophylaxis: holding eliquis  CODE STATUS: dnr, dni   Consults called: [place]  Family  Communication: Admission, patients condition and plan of care including tests being ordered have been discussed with the patient and son, daughter who indicates understanding and agree with the plan and Code Status  Admission status: SDU  Disposition plan: Further plan will depend as patient's clinical course evolves and further radiologic and laboratory data become available.   At the time of admission, it appears that the appropriate admission status for this patient is INPATIENT .Thisis judged to be reasonable and necessary in order to provide the required intensity of service to ensure the patient's safetygiven thepresenting symptoms, physical exam findings, and initial radiographic and laboratory data in the context of their chronic comorbidities.    Subjective: Pt reports that she is not in pain and is comfortable. She is worried about her husband.    Objective: Vitals:   12/02/16 0352 12/02/16 0400 12/02/16 0600 12/02/16 0742  BP:  (!) 103/55 126/75   Pulse:  (!) 118 (!) 131   Resp:  17 17   Temp: 97.2 F (36.2 C)     TempSrc: Axillary     SpO2:  90% 95% 95%  Weight:      Height:        Intake/Output Summary (Last 24 hours) at 12/02/16 0744 Last data filed at 12/02/16 0000  Gross per 24 hour  Intake               50 ml  Output                0 ml  Net               50 ml  Filed Weights   12/01/16 1725  Weight: 56.5 kg (124 lb 9 oz)    Exam:  General: Alert, awake, oriented x3, in no acute distress. HEENT: normocephalic, atraumatic, anicteric sclera, pink conjunctiva, pupils equal and reactive to light and accomodation, oropharynx clear Neck: supple, no masses or lymphadenopathy, no goiter, no bruits  Heart: Regular rate and rhythm, without murmurs, rubs or gallops. Lungs: Clear bilateral.  Abdomen: Soft, nontender, nondistended, positive bowel sounds, no masses. Extremities: No clubbing, cyanosis or edema with positive pedal pulses. Neuro: Grossly  intact, no focal neurological deficits, strength 5/5 upper and lower extremities LUE, LLE.  Normal pulses and perfusion RLE.  Psych: alert and oriented x 3, normal mood and affect Skin: no rashes or lesions, warm and dry   Data Reviewed: Basic Metabolic Panel:  Recent Labs Lab 12/01/16 1355 12/02/16 0344  NA 140 141  K 3.3* 3.9  CL 99* 101  CO2 32 30  GLUCOSE 122* 125*  BUN 32* 35*  CREATININE 1.19* 1.17*  CALCIUM 8.6* 8.2*   Liver Function Tests:  Recent Labs Lab 12/01/16 1355  AST 65*  ALT 31  ALKPHOS 41  BILITOT 1.0  PROT 6.4*  ALBUMIN 3.9   No results for input(s): LIPASE, AMYLASE in the last 168 hours. No results for input(s): AMMONIA in the last 168 hours. CBC:  Recent Labs Lab 12/01/16 1355 12/02/16 0344  WBC 13.8* 10.8*  NEUTROABS 10.5*  --   HGB 14.1 13.1  HCT 45.3 42.5  MCV 86.9 87.6  PLT 200 191   Cardiac Enzymes: No results for input(s): CKTOTAL, CKMB, CKMBINDEX, TROPONINI in the last 168 hours. CBG (last 3)  No results for input(s): GLUCAP in the last 72 hours. Recent Results (from the past 240 hour(s))  MRSA PCR Screening     Status: None   Collection Time: 12/01/16  4:40 PM  Result Value Ref Range Status   MRSA by PCR NEGATIVE NEGATIVE Final    Comment:        The GeneXpert MRSA Assay (FDA approved for NASAL specimens only), is one component of a comprehensive MRSA colonization surveillance program. It is not intended to diagnose MRSA infection nor to guide or monitor treatment for MRSA infections.      Studies: Dg Chest 2 View  Result Date: 12/01/2016 CLINICAL DATA:  [place] [address] on RIGHT hip yesterday, history of RIGHT hip replacement EXAM: CHEST  2 VIEW COMPARISON:  07/09/2016 FINDINGS: Enlargement of cardiac silhouette with pulmonary vascular congestion. Atherosclerotic calcification aorta. Hazy RIGHT upper lobe infiltrate adjacent to minor fissure. Underlying emphysematous changes. Suspected infiltrate at LEFT base as well.  Remaining lungs clear. No pleural effusion or pneumothorax. Diffuse osseous demineralization. RIGHT glenohumeral degenerative changes with prior LEFT shoulder joint replacement. IMPRESSION: Emphysematous changes with suspected RIGHT upper lobe and LEFT basilar infiltrates. Aortic atherosclerosis. Enlargement of cardiac silhouette with pulmonary vascular congestion. Electronically Signed   By: Lavonia Dana M.D.   On: 12/01/2016 12:43   Ct Femur Right Wo Contrast  Result Date: 12/01/2016 CLINICAL DATA:  Fall today. Abnormal hip radiograph indicating fracture. EXAM: CT OF THE LOWER RIGHT EXTREMITY WITHOUT CONTRAST TECHNIQUE: Multidetector CT imaging of the right lower extremity was performed according to the standard protocol. COMPARISON:  12/01/2016 FINDINGS: Oblique fracture through the base of the greater trochanter, with 2.8 cm proximal retraction. The resulting fragment measures 4.6 cm craniocaudad by 2.8 cm transverse by 4.4 cm anterior-posterior and has some minimal comminution. Approximately 14 cm of the stem is still imbedded in  the shaft. I do not see a lesser trochanter fracture. No regional pelvic fracture or other fracture is identified. Osteoarthritis of the knee on the right side. IMPRESSION: 1. Fracture the greater trochanter of the right hip with about 2.7 cm proximal retraction of the proximal fragment, which is mildly comminuted. This strips some of the bone away from the proximal portion of the stem although 14 cm of the stem is still imbedded in the shaft of the femur. I do not see definite lesser trochanteric involvement or definite involvement of the medial bony margin of the proximal stem. 2. Right knee osteoarthritis. Electronically Signed   By: Van Clines M.D.   On: 12/01/2016 16:16   Dg Hip Unilat W Or Wo Pelvis 2-3 Views Right  Result Date: 12/01/2016 CLINICAL DATA:  Fall EXAM: DG HIP (WITH OR WITHOUT PELVIS) 2-3V RIGHT COMPARISON:  None FINDINGS: Diffuse osseous  demineralization. Components of RIGHT hip prosthesis identified. Acute fracture seen at the proximal RIGHT femur and extending from the greater trochanter into the intertrochanteric region. Proximal femoral diaphysis appears intact. No dislocation of the RIGHT hip prosthesis. Minimal degenerative changes of LEFT hip joint and SI joints. Pelvis intact. IMPRESSION: RIGHT hip prosthesis with acute mildly displaced fracture involving the greater trochanter extending into the intertrochanteric region and involving the bones surrounding the proximal portion of the femoral component of the RIGHT hip prosthesis. Electronically Signed   By: Lavonia Dana M.D.   On: 12/01/2016 12:45   Scheduled Meds: . ezetimibe  10 mg Oral Daily   Or  . atorvastatin  20 mg Oral Daily  . ceFEPime (MAXIPIME) IV  1 g Intravenous Q12H  . diltiazem  120 mg Oral Daily  . ipratropium  0.5 mg Nebulization TID  . levalbuterol  0.63 mg Nebulization TID  . levothyroxine  50 mcg Oral Daily  . mouth rinse  15 mL Mouth Rinse BID  . methylPREDNISolone (SOLU-MEDROL) injection  40 mg Intravenous Q12H  . mometasone-formoterol  2 puff Inhalation BID  . polyethylene glycol  17 g Oral Daily  . senna  1 tablet Oral BID  . sertraline  50 mg Oral Daily  . sertraline  75 mg Oral Daily  . sodium chloride HYPERTONIC  4 mL Nebulization BID  . timolol  1 drop Both Eyes Daily  . vancomycin  500 mg Intravenous Q24H   Continuous Infusions:  Principal Problem:   Acute on chronic respiratory failure with hypoxia (HCC) Active Problems:   Hypothyroidism   Hyperlipidemia   Essential hypertension   HCAP (healthcare-associated pneumonia)   Hip fracture [facility])  Critical Care Time spent: 40 mins  Irwin Brakeman, MD, FAAFP [facility] Pager [phone]  If 7PM-7AM, please contact night-coverage [email or web address] Password [email or web address] 12/02/2016, 7:44 AM    LOS: 1 day

## 2016-12-02 NOTE — Telephone Encounter (Signed)
Spoke with Abigail Butts, pt's hospice nurse  She states pt admitted to [facility] for hip fx  Will forward to BQ as FYI

## 2016-12-02 NOTE — Progress Notes (Signed)
Hypertonic NS given

## 2016-12-02 NOTE — Progress Notes (Signed)
[facility] Liaison note  This is a GIP [facility] covered and related admission of 12/01/16, per Dr. Earlie Counts. Patient came to [facility] on 12/10, after a fall.  Patient's diagnosis with [facility] is COPD.  Patient is a DNR code status.  Noted, patient has a fracture of the greater trochanter of the right hip.    Visited patient at the bedside.  She denies pain.  She is getting ready to eat breakfast.  She is attended by her husband.  Patient is from [place].  Patient reports; her hip is broken, but she does not know at this time, if it will be surgically repaired.    Patient's oxygen saturation 92% via monitor on 8L of oxygen.  Patient has not received any prn medication, as of this writing.    Patient is receiving Maxipime 1 g IV q 12 hours. Patient is also receiving Vancomycin IV 500 mg q day (to start today)  [facility] will continue to follow daily and assist as needed.  Efrain Sella, RN, BSN [phone]

## 2016-12-02 NOTE — Care Management Note (Signed)
Case Management Note  Patient Details  Name: MERCEDA RIESGO MRN: [id number] Date of Birth: 1927-04-21  Subjective/Objective:    Hypoxia with hfnc at 80%                Action/Plan: Lives at home with spouse Date:  December 02, 2016 Chart reviewed for concurrent status and case management needs. Will continue to follow patient progress. Discharge Planning: following for needs Expected discharge date: [id number] Velva Harman, BSN, [place], [place]  Expected Discharge Date:                  Expected Discharge Plan:  Home/Self Care  In-House Referral:     Discharge planning Services     Post Acute Care Choice:    Choice offered to:     DME Arranged:    DME Agency:     HH Arranged:    [place] Agency:     Status of Service:  In process, will continue to follow  If discussed at Long Length of Stay Meetings, dates discussed:    Additional Comments:  Leeroy Cha, RN 12/02/2016, 9:59 AM

## 2016-12-02 NOTE — Progress Notes (Signed)
PT Cancellation Note  Patient Details Name: ESTERLENE SIMI MRN: [id number] DOB: 02-05-1927   Cancelled Treatment:    Reason Eval/Treat Not Completed: Medical issues which prohibited therapy (ortho consult is pending for hip fracture. )   Claretha Cooper 12/02/2016, 8:47 AM Tresa Endo PT [phone]

## 2016-12-03 ENCOUNTER — Encounter (INDEPENDENT_AMBULATORY_CARE_PROVIDER_SITE_OTHER): Payer: Self-pay | Admitting: Orthopedic Surgery

## 2016-12-03 DIAGNOSIS — S72111A Displaced fracture of greater trochanter of right femur, initial encounter for closed fracture: Secondary | ICD-10-CM | POA: Insufficient documentation

## 2016-12-03 LAB — CBC WITH DIFFERENTIAL/PLATELET
BASOS PCT: 0 %
Basophils Absolute: 0 10*3/uL (ref 0.0–0.1)
EOS ABS: 0 10*3/uL (ref 0.0–0.7)
Eosinophils Relative: 0 %
HEMATOCRIT: 39 % (ref 36.0–46.0)
HEMOGLOBIN: 12.5 g/dL (ref 12.0–15.0)
LYMPHS ABS: 1.3 10*3/uL (ref 0.7–4.0)
Lymphocytes Relative: 10 %
MCH: 26.7 pg (ref 26.0–34.0)
MCHC: 32.1 g/dL (ref 30.0–36.0)
MCV: 83.2 fL (ref 78.0–100.0)
MONO ABS: 0.7 10*3/uL (ref 0.1–1.0)
MONOS PCT: 5 %
NEUTROS PCT: 85 %
Neutro Abs: 11.8 10*3/uL — ABNORMAL HIGH (ref 1.7–7.7)
Platelets: 175 10*3/uL (ref 150–400)
RBC: 4.69 MIL/uL (ref 3.87–5.11)
RDW: 16.3 % — AB (ref 11.5–15.5)
WBC: 13.8 10*3/uL — ABNORMAL HIGH (ref 4.0–10.5)

## 2016-12-03 LAB — BASIC METABOLIC PANEL
Anion gap: 8 (ref 5–15)
BUN: 44 mg/dL — ABNORMAL HIGH (ref 6–20)
CALCIUM: 9 mg/dL (ref 8.9–10.3)
CHLORIDE: 100 mmol/L — AB (ref 101–111)
CO2: 32 mmol/L (ref 22–32)
CREATININE: 1.06 mg/dL — AB (ref 0.44–1.00)
GFR calc Af Amer: 53 mL/min — ABNORMAL LOW (ref >60)
GFR calc non Af Amer: 46 mL/min — ABNORMAL LOW (ref >60)
Glucose, Bld: 133 mg/dL — ABNORMAL HIGH (ref 65–99)
Potassium: 4 mmol/L (ref 3.5–5.1)
Sodium: 140 mmol/L (ref 135–145)

## 2016-12-03 MED ORDER — APIXABAN 2.5 MG PO TABS
2.5000 mg | ORAL_TABLET | Freq: Two times a day (BID) | ORAL | Status: DC
  Administered 2016-12-03 – 2016-12-04 (×2): 2.5 mg via ORAL
  Filled 2016-12-03 (×2): qty 1

## 2016-12-03 MED ORDER — PREDNISONE 20 MG PO TABS
40.0000 mg | ORAL_TABLET | Freq: Every day | ORAL | Status: DC
  Administered 2016-12-04: 40 mg via ORAL
  Filled 2016-12-03: qty 2

## 2016-12-03 MED ORDER — FUROSEMIDE 40 MG PO TABS
40.0000 mg | ORAL_TABLET | Freq: Every day | ORAL | Status: DC
Start: 2016-12-03 — End: 2016-12-04
  Administered 2016-12-03 – 2016-12-04 (×2): 40 mg via ORAL
  Filled 2016-12-03 (×2): qty 1

## 2016-12-03 MED ORDER — DOXYCYCLINE HYCLATE 100 MG PO TABS
100.0000 mg | ORAL_TABLET | Freq: Two times a day (BID) | ORAL | Status: DC
  Administered 2016-12-03 – 2016-12-04 (×2): 100 mg via ORAL
  Filled 2016-12-03 (×2): qty 1

## 2016-12-03 NOTE — Progress Notes (Signed)
PROGRESS NOTE    Kristine Simmons  [id number]  DOB: 1927-02-17  DOA: 12/01/2016 PCP: Binnie Rail, MD Outpatient Specialists:  Hospital course: Patient is a 82 year old female with diastolic CHF, aortic stenosis, atrial fibrillation, COPD, osteoporosis, hypothyroidism presented from [facility] independent [facility] facility after a fall. History was obtained from the patient and her family at bedside. Patient is currently in hospice for chronic respiratory failure for one year. Patient had a mechanical fall today after tripping over a door entry and landed on her right hip. She denied any loss of consciousness, syncopal episode. The patient's family, she was also having cough and congestion for last 3 days and was started on antibiotics, prednisone by hospice nurse. They do not know if she had any fevers or chills or any chest pain.  She was found to have an acute hip fracture.  She was admitted to SDU.   Assessment & Plan:    Acute on chronic respiratory failure with hypoxia secondary to COPD and possible HCAP - Placed on pneumonia protocol, placed on IV vancomycin and cefepime for HCAP - Follow urine legionella antigen, urine strep antigen, influenza panel, sputum cultures, blood cultures -Placed on Xopenex, Atrovent, Solu-Medrol  -De-escalate to doxycycline 12/12. Clinically improving.    Hypothyroidism - Continue Synthroid    Essential hypertension -  continue Cardizem   Right  Hip fracture ([place]) - No surgery required per orthopedics. Appreciate their assistance. plan is to keep her bed to chair for several weeks. Physical therapy to teach bed to chair nonweightbearing versus toe touch weightbearing on the right. Follow-up in our office in 7-10 days for recheck evaluation and re-x-ray - Resume eliquis  - High risk for surgery given acute on chronic respiratory failure, on 6 L O2  Chronic atrial fibrillation - rate is better controlled today, restarted home Cardizem -  Resume home eliquis for anticoagulation - IV diltiazem given as needed.  DVT prophylaxis: Resuming eliquis  CODE STATUS: dnr, dni   Consults called: [facility]  Family Communication: Admission, patients condition and plan of care including tests being ordered have been discussed with the patient and son, daughter who indicates understanding and agree with the plan and Code Status  Admission status: SDU  Disposition plan: DC to SNF rehab 12/13  At the time of admission, it appears that the appropriate admission status for this patient is INPATIENT .Thisis judged to be reasonable and necessary in order to provide the required intensity of service to ensure the patient's safetygiven thepresenting symptoms, physical exam findings, and initial radiographic and laboratory data in the context of their chronic comorbidities.    Subjective: Pt without complaints today.     Objective: Vitals:   12/02/16 2033 12/03/16 0546 12/03/16 0550 12/03/16 1446  BP:  (!) 142/83 128/83 113/76  Pulse:  72 77 (!) 104  Resp:  18 18 18   Temp:  98.6 F (37 C) 97.5 F (36.4 C) 97.8 F (36.6 C)  TempSrc:  Oral Oral Oral  SpO2: 91% 97% 91% 91%  Weight:      Height:        Intake/Output Summary (Last 24 hours) at 12/03/16 1450 Last data filed at 12/03/16 1000  Gross per 24 hour  Intake              750 ml  Output                0 ml  Net  750 ml   Filed Weights   12/01/16 1725  Weight: 56.5 kg (124 lb 9 oz)    Exam:  General: Alert, awake, oriented x3, in no acute distress. HEENT: normocephalic, atraumatic, anicteric sclera, pink conjunctiva, pupils equal and reactive to light and accomodation, oropharynx clear Neck: supple, no masses or lymphadenopathy, no goiter, no bruits  Heart: Regular rate and rhythm, without murmurs, rubs or gallops. Lungs: Clear bilateral.  Abdomen: Soft, nontender, nondistended, positive bowel sounds, no masses. Extremities: No  clubbing, cyanosis or edema with positive pedal pulses. Neuro: Grossly intact, no focal neurological deficits, strength 5/5 upper and lower extremities LUE, LLE.  Normal pulses and perfusion RLE.  Psych: alert and oriented x 3, normal mood and affect Skin: no rashes or lesions, warm and dry   Data Reviewed: Basic Metabolic Panel:  Recent Labs Lab 12/01/16 1355 12/02/16 0344 12/03/16 0543  NA 140 141 140  K 3.3* 3.9 4.0  CL 99* 101 100*  CO2 32 30 32  GLUCOSE 122* 125* 133*  BUN 32* 35* 44*  CREATININE 1.19* 1.17* 1.06*  CALCIUM 8.6* 8.2* 9.0   Liver Function Tests:  Recent Labs Lab 12/01/16 1355  AST 65*  ALT 31  ALKPHOS 41  BILITOT 1.0  PROT 6.4*  ALBUMIN 3.9   No results for input(s): LIPASE, AMYLASE in the last 168 hours. No results for input(s): AMMONIA in the last 168 hours. CBC:  Recent Labs Lab 12/01/16 1355 12/02/16 0344 12/03/16 0543  WBC 13.8* 10.8* 13.8*  NEUTROABS 10.5*  --  11.8*  HGB 14.1 13.1 12.5  HCT 45.3 42.5 39.0  MCV 86.9 87.6 83.2  PLT 200 191 175   Cardiac Enzymes: No results for input(s): CKTOTAL, CKMB, CKMBINDEX, TROPONINI in the last 168 hours. CBG (last 3)  No results for input(s): GLUCAP in the last 72 hours. Recent Results (from the past 240 hour(s))  Blood culture (routine x 2)     Status: None (Preliminary result)   Collection Time: 12/01/16  1:40 PM  Result Value Ref Range Status   Specimen Description BLOOD LEFT HAND  5 ML IN [facility] BOTTLE  Final   Special Requests NONE  Final   Culture   Final    NO GROWTH 2 DAYS Performed at [facility]    Report Status PENDING  Incomplete  Blood culture (routine x 2)     Status: None (Preliminary result)   Collection Time: 12/01/16  1:50 PM  Result Value Ref Range Status   Specimen Description BLOOD LEFT HAND  3 ML IN YELLOW  Final   Special Requests NONE  Final   Culture   Final    NO GROWTH 2 DAYS Performed at [facility]    Report Status PENDING  Incomplete    MRSA PCR Screening     Status: None   Collection Time: 12/01/16  4:40 PM  Result Value Ref Range Status   MRSA by PCR NEGATIVE NEGATIVE Final    Comment:        The GeneXpert MRSA Assay (FDA approved for NASAL specimens only), is one component of a comprehensive MRSA colonization surveillance program. It is not intended to diagnose MRSA infection nor to guide or monitor treatment for MRSA infections.      Studies: Ct Femur Right Wo Contrast  Result Date: 12/01/2016 CLINICAL DATA:  Fall today. Abnormal hip radiograph indicating fracture. EXAM: CT OF THE LOWER RIGHT EXTREMITY WITHOUT CONTRAST TECHNIQUE: Multidetector CT imaging of the right lower extremity  was performed according to the standard protocol. COMPARISON:  12/01/2016 FINDINGS: Oblique fracture through the base of the greater trochanter, with 2.8 cm proximal retraction. The resulting fragment measures 4.6 cm craniocaudad by 2.8 cm transverse by 4.4 cm anterior-posterior and has some minimal comminution. Approximately 14 cm of the stem is still imbedded in the shaft. I do not see a lesser trochanter fracture. No regional pelvic fracture or other fracture is identified. Osteoarthritis of the knee on the right side. IMPRESSION: 1. Fracture the greater trochanter of the right hip with about 2.7 cm proximal retraction of the proximal fragment, which is mildly comminuted. This strips some of the bone away from the proximal portion of the stem although 14 cm of the stem is still imbedded in the shaft of the femur. I do not see definite lesser trochanteric involvement or definite involvement of the medial bony margin of the proximal stem. 2. Right knee osteoarthritis. Electronically Signed   By: Van Clines M.D.   On: 12/01/2016 16:16   Scheduled Meds: . ezetimibe  10 mg Oral Daily   Or  . atorvastatin  20 mg Oral Daily  . diltiazem  120 mg Oral Daily  . doxycycline  100 mg Oral Q12H  . feeding supplement (ENSURE ENLIVE)  237  mL Oral BID BM  . levothyroxine  50 mcg Oral Daily  . mouth rinse  15 mL Mouth Rinse BID  . mometasone-formoterol  2 puff Inhalation BID  . polyethylene glycol  17 g Oral Daily  . [START ON 12/04/2016] predniSONE  40 mg Oral Q breakfast  . senna  1 tablet Oral BID  . sertraline  75 mg Oral Daily  . sodium chloride HYPERTONIC  4 mL Nebulization BID  . timolol  1 drop Both Eyes Daily   Continuous Infusions:  Principal Problem:   Acute on chronic respiratory failure with hypoxia (HCC) Active Problems:   Hypothyroidism   Hyperlipidemia   Essential hypertension   HCAP (healthcare-associated pneumonia)   Hip fracture (HCC)   Chronic atrial fibrillation ([place])   DNR (do not resuscitate)   Hospice care patient  Time spent: 24 mins  Irwin Brakeman, MD, FAAFP [facility] Pager [phone]  If 7PM-7AM, please contact night-coverage [email or web address] Password [email or web address] 12/03/2016, 2:50 PM    LOS: 2 days

## 2016-12-03 NOTE — Progress Notes (Signed)
Physical Therapy Treatment Patient Details Name: Kristine Simmons MRN: [id number] DOB: [birth year] Today's Date: 12/03/2016    History of Present Illness Patient is a 82 year old female with diastolic CHF, aortic stenosis, atrial fibrillation, COPD, osteoporosis, hypothyroidism presented from [facility] independent living facility after a fall.  Patient is currently in hospice for chronic respiratory failure. Sustained   Mildly displaced fracture of the greater trochanter. no surgery planned, NWB/TDWB transfers only for next  several weeks.    PT Comments    The patient  Performed transfers better without RW this visit and moving to the left so right legf has decreased weight. Continue PT.  Follow Up Recommendations  SNF;Supervision/Assistance - 24 hour     Equipment Recommendations  None recommended by PT    Recommendations for Other Services       Precautions / Restrictions Precautions Precautions: Fall Precaution Comments: on 6 liters O2 Restrictions RLE Weight Bearing: Touchdown weight bearing Other Position/Activity Restrictions: transfers only bed to chair    Mobility  Bed Mobility Overal bed mobility: Needs Assistance Bed Mobility: Sit to Supine     Supine to sit: Mod assist Sit to supine: Mod assist   General bed mobility comments: assist with legs onto the bed  Transfers Overall transfer level: Needs assistance Equipment used: Rolling walker (2 wheeled) Transfers: Sit to/from [facility] Sit to Stand: Mod assist Stand pivot transfers: Mod assist       General transfer comment: bear hug stand pivot to [facility] then to bed. multimodal cues for TDWB right leg.  Ambulation/Gait                 Stairs            Wheelchair Mobility    Modified Rankin (Stroke Patients Only)       Balance                                    Cognition Arousal/Alertness: Awake/alert Behavior During Therapy: WFL for tasks  assessed/performed Overall Cognitive Status: Within Functional Limits for tasks assessed                      Exercises      General Comments        Pertinent Vitals/Pain Pain Assessment: 0-10 Pain Score: 3  Pain Location: right hip Pain Descriptors / Indicators: Discomfort;Grimacing Pain Intervention(s): Monitored during session;Premedicated before session;Repositioned;Patient requesting pain meds-RN notified    Home Living Family/patient expects to be discharged to:: Private residence Living Arrangements: Spouse/significant other Available Help at Discharge: Family Type of Home: Apartment Home Access: Level entry     Home Equipment: Walker - 4 wheels;Transport chair Additional Comments: goes to 2 meals per day     Prior Function        Comments: Multiple falls at home   PT Goals (current goals can now be found in the care plan section) Acute Rehab PT Goals Patient Stated Goal: agreed to get up to recliner PT Goal Formulation: With patient/family Time For Goal Achievement: 12/17/16 Potential to Achieve Goals: Fair Progress towards PT goals: Progressing toward goals    Frequency    Min 3X/week      PT Plan Current plan remains appropriate    Co-evaluation             End of Session Equipment Utilized During Treatment: Gait belt Activity Tolerance: Patient tolerated treatment  well Patient left: in bed;with call bell/phone within reach;with bed alarm set;with family/visitor present     Time: 0206-0228 PT Time Calculation (min) (ACUTE ONLY): 22 min  Charges:  $Therapeutic Activity: 8-22 mins                    G Codes:      Claretha Cooper 12/03/2016, 3:53 PM

## 2016-12-03 NOTE — Evaluation (Signed)
Physical Therapy Evaluation Patient Details Name: Kristine Simmons MRN: [id number] DOB: Jul 20, 1927 Today's Date: 12/03/2016   History of Present Illness  Patient is a 82 year old female with diastolic CHF, aortic stenosis, atrial fibrillation, COPD, osteoporosis, hypothyroidism presented from [facility] independent living facility after a fall.  Patient is currently in hospice for chronic respiratory failure. Sustained   Mildly displaced fracture of the greater trochanter. no surgery planned, NWB/TDWB transfers only for next  several weeks.  Clinical Impression  The patient tolerated transfer from bed to recliner requiring moderate asisstance and frequent cues for limited right leg weight. Appeared to have decreased control of the right leg when attempted to move it during turn. The leg adducted. Manual assistance by PT to move the leg back into alignment Family present. The patient's spouse is limited in amount of assistance  That he can provide as patient now requires assistance for transfers and will be WC dependentt. Pt admitted with above diagnosis. Pt currently with functional limitations due to the deficits listed below (see PT Problem List).  Pt will benefit from skilled PT to increase their independence and safety with mobility to allow discharge to the venue listed below.       Follow Up Recommendations SNF;Supervision/Assistance - 24 hour    Equipment Recommendations  None recommended by PT    Recommendations for Other Services       Precautions / Restrictions Precautions Precautions: Fall Precaution Comments: on 6 liters O2 Restrictions RLE Weight Bearing: Touchdown weight bearing Other Position/Activity Restrictions: transfers only bed to chair      Mobility  Bed Mobility Overal bed mobility: Needs Assistance Bed Mobility: Supine to Sit     Supine to sit: Mod assist     General bed mobility comments: extra time, use of bed rail and pad to  and assist right leg  to edge of bed.   Transfers Overall transfer level: Needs assistance Equipment used: Rolling walker (2 wheeled) Transfers: Sit to/from [facility] Sit to Stand: Mod assist Stand pivot transfers: Mod assist       General transfer comment: multimodal cues  for right leg limited weight. used RW, assist to pivot and sit to recliner. oxygen sats on 6 liters 84%. HR 101  Ambulation/Gait                Stairs            Wheelchair Mobility    Modified Rankin (Stroke Patients Only)       Balance                                             Pertinent Vitals/Pain Pain Assessment: 0-10 Pain Score: 1  Pain Location: right hip Pain Intervention(s): Monitored during session;Limited activity within patient's tolerance    Home Living Family/patient expects to be discharged to:: Private residence Living Arrangements: Spouse/significant other Available Help at Discharge: Family Type of Home: Apartment Home Access: Level entry       Home Equipment: Walker - 4 wheels;Transport chair Additional Comments: goes to 2 meals per day     Prior Function           Comments: Multiple falls at home     Hand Dominance        Extremity/Trunk Assessment   Upper Extremity Assessment: Generalized weakness  Lower Extremity Assessment: Generalized weakness      Cervical / Trunk Assessment: Kyphotic  Communication   Communication: HOH  Cognition Arousal/Alertness: Awake/alert Behavior During Therapy: WFL for tasks assessed/performed Overall Cognitive Status: Within Functional Limits for tasks assessed                      General Comments      Exercises     Assessment/Plan    PT Assessment Patient needs continued PT services  PT Problem List Decreased strength;Decreased activity tolerance;Decreased range of motion;Decreased balance;Decreased mobility;Cardiopulmonary status limiting activity;Decreased  knowledge of precautions;Decreased safety awareness;Decreased knowledge of use of DME;Pain          PT Treatment Interventions Functional mobility training;Therapeutic activities;Therapeutic exercise;Patient/family education    PT Goals (Current goals can be found in the Care Plan section)  Acute Rehab PT Goals Patient Stated Goal: agreed to get up to recliner PT Goal Formulation: With patient/family Time For Goal Achievement: 12/17/16 Potential to Achieve Goals: Fair    Frequency Min 3X/week   Barriers to discharge Decreased caregiver support      Co-evaluation               End of Session Equipment Utilized During Treatment: Gait belt Activity Tolerance: Patient limited by fatigue;Treatment limited secondary to medical complications (Comment) Patient left: in chair;with call bell/phone within reach;with chair alarm set;with family/visitor present Nurse Communication: Mobility status         Time: [id number] PT Time Calculation (min) (ACUTE ONLY): 41 min   Charges:   PT Evaluation $PT Eval Moderate Complexity: 1 Procedure PT Treatments $Therapeutic Activity: 23-37 mins   PT G Codes:        Claretha Cooper 12/03/2016, 3:45 PM Tresa Endo PT [phone]

## 2016-12-03 NOTE — Progress Notes (Signed)
[facility] Liaison note  This is a GIP [facility] covered and related admission of 12/01/16, per Dr. Earlie Counts. Patient came to [facility] on 12/10, after a fall.  Patient's diagnosis with [facility] is COPD.  Patient is a DNR code status.  Noted, patient has a fracture of the greater trochanter of the right hip.    Visited with patient at the bedside.  She is attended by her spouse and her children, Santiago Glad and Abbe Amsterdam.  Patient denies pain.  She has just completed her first session with physical therapy.  Patient's children are not convinced patient will be able to return to her [place]. [facility] SW has been notified of this.    Patient has not received any prn medications today.    [facility] will continue to follow daily and assist as needed.  Efrain Sella, RN, BSN [phone]

## 2016-12-03 NOTE — Progress Notes (Signed)
Scheduled hypertonic NaCl 3% 33ml and MDI Dulera not given at this time. pt is sleeping comfortably no distress or complications noted.

## 2016-12-03 NOTE — NC FL2 (Signed)
[place] MEDICAID FL2 LEVEL OF CARE SCREENING TOOL     IDENTIFICATION  Patient Name: Kristine Simmons Birthdate: [birth year] Sex: female Admission Date (Current Location): 12/01/2016  [facility] and [place] Number:  Herbalist and Address:  [facility],  [place] [address] [place]      Provider Number: [id number]  Attending Physician Name and Address:  Murlean Iba, MD  Relative Name and Phone Number:       Current Level of Care: SNF Recommended Level of Care: [place] Prior Approval Number:    Date Approved/Denied:   PASRR Number:   [id number]  Discharge Plan: SNF    Current Diagnoses: Patient Active Problem List   Diagnosis Date Noted  . Fracture of greater trochanter of right femur ([place]) 12/03/2016  . Chronic atrial fibrillation ([place]) 12/02/2016  . DNR (do not resuscitate) 12/02/2016  . Hospice care patient 12/02/2016  . HCAP (healthcare-associated pneumonia) 12/01/2016  . Hip fracture ([place]) 12/01/2016  . Chronic obstructive pulmonary disease ([place])   . Pleural effusion   . Hypoxia 07/07/2016  . UTI (urinary tract infection) with e coli 06/13/2016  . Pneumonia 06/10/2016  . Moderate to severe pulmonary hypertension 04/04/2016  . Moderate tricuspid regurgitation 04/04/2016  . Spinal stenosis of lumbar region 01/10/2016  . Cardiorenal syndrome 11/05/2015  . Palliative care encounter   . Goals of care, counseling/discussion   . Acute diastolic heart failure ([place]) 11/03/2015  . Acute on chronic respiratory failure with hypoxia ([place]) 11/02/2015  . T12 compression fracture ([place]) 11/02/2015  . Acute respiratory failure with hypoxia ([place]) 11/02/2015  . Aspiration pneumonia ([place]) 04/09/2015  . CAP (community acquired pneumonia) 04/08/2015  . Dysphagia, pharyngoesophageal phase 03/07/2015  . Recurrent inguinal hernia with obstruction 11/11/2014  . Depression 09/22/2014  . Hypoxemia 09/22/2014  . Aortic stenosis,  mild 08/15/2014  . Chronic anticoagulation 08/15/2014  . Recurrent femoral hernia with obstruction without gangrene 08/15/2014  . History of CVA (cerebrovascular accident) 08/15/2014  . Squamous cell carcinoma of neck ([place]) 03/09/2014  . Cough 09/16/2012  . Diverticulosis 09/11/2011  . Glaucoma 09/11/2011  . Carotid arterial disease ([place]) 08/08/2009  . HEARING LOSS 11/22/2008  . COPD, Gold grade A 04/08/2008  . HERNIA 04/08/2008  . ARTHRITIS 04/08/2008  . VITAMIN D DEFICIENCY 02/01/2008  . Atrial fibrillation ([place]) 02/01/2008  . Essential hypertension 12/01/2007  . OSTEOPOROSIS 12/01/2007  . Hypothyroidism 08/06/2007  . DISEQUILIBRIUM 07/24/2007  . G E R D 06/02/2007  . THYROID NODULE, LEFT 04/10/2007  . Hyperlipidemia 04/10/2007  . COLONIC POLYPS, HX OF 04/10/2007    Orientation RESPIRATION BLADDER Height & Weight     Self  O2 (6L High Flow O2) Continent Weight: 124 lb 9 oz (56.5 kg) Height:  5\' 7"  (170.2 cm)  BEHAVIORAL SYMPTOMS/MOOD NEUROLOGICAL BOWEL NUTRITION STATUS      Continent Diet (Heart Healthy)  AMBULATORY STATUS COMMUNICATION OF NEEDS Skin   Extensive Assist Verbally Bruising (Bilateral Arm, Right Hip)                       Personal Care Assistance Level of Assistance  Bathing, Feeding, Dressing Bathing Assistance: Limited assistance Feeding assistance: Independent Dressing Assistance: Limited assistance     Functional Limitations Info  Sight, Hearing, Speech Sight Info: Adequate Hearing Info: Adequate Speech Info: Adequate    SPECIAL CARE FACTORS FREQUENCY  PT (By licensed PT), OT (By licensed OT)     PT Frequency: 5  Contractures Contractures Info: Not present    Additional Factors Info  Code Status, Allergies Code Status Info: DNR Allergies Info: Hydrocodone, Desmopressin Acetate, Dorzolamide Hcl, Prempro Conj Estrog-medroxyprogest Ace, Ciprofloxacin           Current Medications (12/03/2016):  This is the current  hospital active medication list Current Facility-Administered Medications  Medication Dose Route Frequency Provider Last Rate Last Dose  . ezetimibe (ZETIA) tablet 10 mg  10 mg Oral Daily Ripudeep K Rai, MD   10 mg at 12/01/16 1630   Or  . atorvastatin (LIPITOR) tablet 20 mg  20 mg Oral Daily Ripudeep K Rai, MD   20 mg at 12/03/16 1045  . diltiazem (CARDIZEM CD) 24 hr capsule 120 mg  120 mg Oral Daily Ripudeep K Rai, MD   120 mg at 12/03/16 1046  . doxycycline (VIBRA-TABS) tablet 100 mg  100 mg Oral Q12H Clanford L Johnson, MD      . feeding supplement (ENSURE ENLIVE) (ENSURE ENLIVE) liquid 237 mL  237 mL Oral BID BM Clanford L Johnson, MD   237 mL at 12/03/16 1000  . ipratropium (ATROVENT) nebulizer solution 0.5 mg  0.5 mg Nebulization TID PRN Clanford Marisa Hua, MD      . levalbuterol Penne Lash) nebulizer solution 0.63 mg  0.63 mg Nebulization TID PRN Clanford Marisa Hua, MD      . levothyroxine (SYNTHROID, LEVOTHROID) tablet 50 mcg  50 mcg Oral Daily Ripudeep Krystal Eaton, MD   50 mcg at 12/03/16 1045  . LORazepam (ATIVAN) tablet 0.5 mg  0.5 mg Oral QHS PRN Ripudeep Krystal Eaton, MD   0.5 mg at 12/02/16 2123  . MEDLINE mouth rinse  15 mL Mouth Rinse BID Ripudeep K Rai, MD      . mometasone-formoterol (DULERA) 100-5 MCG/ACT inhaler 2 puff  2 puff Inhalation BID Ripudeep Krystal Eaton, MD   2 puff at 12/02/16 0738  . morphine 2 MG/ML injection 0.5 mg  0.5 mg Intravenous Q3H PRN Clanford L Johnson, MD      . polyethylene glycol (MIRALAX / GLYCOLAX) packet 17 g  17 g Oral Daily Ripudeep K Rai, MD   17 g at 12/03/16 1047  . [START ON 12/04/2016] predniSONE (DELTASONE) tablet 40 mg  40 mg Oral Q breakfast Clanford L Johnson, MD      . senna (SENOKOT) tablet 8.6 mg  1 tablet Oral BID Ripudeep K Rai, MD   8.6 mg at 12/03/16 1046  . sertraline (ZOLOFT) tablet 75 mg  75 mg Oral Daily Ripudeep K Rai, MD   75 mg at 12/03/16 1046  . sodium chloride HYPERTONIC 3 % nebulizer solution 4 mL  4 mL Nebulization BID Ripudeep Krystal Eaton, MD    4 mL at 12/02/16 2033  . timolol (TIMOPTIC) 0.5 % ophthalmic solution 1 drop  1 drop Both Eyes Daily Ripudeep Krystal Eaton, MD   1 drop at 12/03/16 1048     Discharge Medications: Please see discharge summary for a list of discharge medications.  Relevant Imaging Results:  Relevant Lab Results:   Additional Information ss#[id number]  Lia Hopping, LCSW

## 2016-12-03 NOTE — Social Work (Signed)
Hospice and [facility]) Social Work Note - SW and [employer], Chaplain met with pt and her daughter, Santiago Glad.  Chaplain provided spiritual care.  SW provided education regarding discharge planning.  Pt and family are waiting for PT recommendations before deciding on discharge.  Pt currently lives with her husband at [place].  Please contact [facility] with any questions. Jasmine Awe, LCSW [phone]

## 2016-12-03 NOTE — Progress Notes (Signed)
PT Cancellation Note  Patient Details Name: Kristine Simmons MRN: [id number] DOB: [birth year]   Cancelled Treatment:    Reason Eval/Treat Not Completed: Patient not medically ready (awaiting ortho consult.)   Claretha Cooper 12/03/2016, 7:24 AM Tresa Endo PT [phone]

## 2016-12-03 NOTE — Clinical Social Work Note (Signed)
Clinical Social Work Assessment  Patient Details  Name: Kristine Simmons MRN: [id number] Date of Birth: [birth year]  Date of referral:  12/03/16               Reason for consult:  Facility Placement                Permission sought to share information with:  Facility Sport and exercise psychologist, Family Supports Permission granted to share information::  Yes, Verbal Permission Granted  Name::     Kristine Simmons  Agency::  SNF  Relationship::  Daughter  Contact Information:  [phone], [phone]  Housing/Transportation Living arrangements for the past 2 months:  [place] of Information:  Adult Children Patient Interpreter Needed:  None Criminal Activity/Legal Involvement Pertinent to Current Situation/Hospitalization:  No - Comment as needed Significant Relationships:  Adult Children Lives with:  Spouse Do you feel safe going back to the place where you live?    Need for family participation in patient care:  Yes (Comment)  Care giving concerns:  Patient family is concerned with patient and spouse ability to manage at independent facility with other support after a fall and fracture while at home.   Social Worker assessment / plan:  LCSWA met with patient Adult children and explained reason for consult. The family reports the patient is set up with hospice care services but can benefit from physical therapy at SNF before going home. The family is interested in [facility] as the facility is close to [employer] and spouse can continue to visit.   Employment status:  Retired Forensic scientist:    PT Recommendations:  [place] / Referral to community resources:  [place]  Patient/Family's Response to care:  Agreeable to patient care.   Patient/Family's Understanding of and Emotional Response to Diagnosis, Current Treatment, and Prognosis: Patient family knowledgeable to diagnosis and need for physical  therapy.   Emotional Assessment Appearance:  Appears stated age Attitude/Demeanor/Rapport:    Affect (typically observed):    Orientation:  Oriented to Self Alcohol / Substance use:  Not Applicable Psych involvement (Current and /or in the community):  No (Comment)  Discharge Needs  Concerns to be addressed:  Care Coordination Readmission within the last 30 days:  Yes Current discharge risk:  Dependent with Mobility Barriers to Discharge:  Continued Medical Work up   [employer], LCSW 12/03/2016, 4:18 PM

## 2016-12-03 NOTE — Consult Note (Signed)
Kristine Fears, MD   Biagio Borg, PA-C [address], [place], [place]  [address]                             [phone]   [place]            MRN:  [id number] DOB/SEX:  1927-02-25/female    REQUESTING PHYSICIAN:  Binnie Rail, MD  CHIEF COMPLAINT:  Painful right hip  HISTORY: Kristine Boga Withersis a 82 y.o. female who was at home at [facility] as an assisted living with her husband. Apparently she was without her walker at the closet trying to choose some clothes and apparently had either twisted or just fell sustaining an injury to her right hip when she fell. She did have a history of recent cough and hypoxia and was treated on outpatient antibiotics. After her fall, She was unable to walk. She is brought to the emergency room at [facility] and diagnosed with a greater trochanteric fracture. Consultation was made for orthopedics to evaluate.   PAST MEDICAL HISTORY: Patient Active Problem List   Diagnosis Date Noted  . Fracture of greater trochanter of right femur ([place]) 12/03/2016  . Chronic atrial fibrillation ([place]) 12/02/2016  . DNR (do not resuscitate) 12/02/2016  . Hospice care patient 12/02/2016  . HCAP (healthcare-associated pneumonia) 12/01/2016  . Hip fracture ([place]) 12/01/2016  . Chronic obstructive pulmonary disease ([place])   . Pleural effusion   . Hypoxia 07/07/2016  . UTI (urinary tract infection) with e coli 06/13/2016  . Pneumonia 06/10/2016  . Moderate to severe pulmonary hypertension 04/04/2016  . Moderate tricuspid regurgitation 04/04/2016  . Spinal stenosis of lumbar region 01/10/2016  . Cardiorenal syndrome 11/05/2015  . Palliative care encounter   . Goals of care, counseling/discussion   . Acute diastolic heart failure ([place]) 11/03/2015  . Acute on chronic respiratory failure with hypoxia ([place]) 11/02/2015  . T12 compression fracture ([place]) 11/02/2015  . Acute respiratory failure with hypoxia ([place]) 11/02/2015  .  Aspiration pneumonia ([place]) 04/09/2015  . CAP (community acquired pneumonia) 04/08/2015  . Dysphagia, pharyngoesophageal phase 03/07/2015  . Recurrent inguinal hernia with obstruction 11/11/2014  . Depression 09/22/2014  . Hypoxemia 09/22/2014  . Aortic stenosis, mild 08/15/2014  . Chronic anticoagulation 08/15/2014  . Recurrent femoral hernia with obstruction without gangrene 08/15/2014  . History of CVA (cerebrovascular accident) 08/15/2014  . Squamous cell carcinoma of neck ([place]) 03/09/2014  . Cough 09/16/2012  . Diverticulosis 09/11/2011  . Glaucoma 09/11/2011  . Carotid arterial disease ([place]) 08/08/2009  . HEARING LOSS 11/22/2008  . COPD, Gold grade A 04/08/2008  . HERNIA 04/08/2008  . ARTHRITIS 04/08/2008  . VITAMIN D DEFICIENCY 02/01/2008  . Atrial fibrillation ([place]) 02/01/2008  . Essential hypertension 12/01/2007  . OSTEOPOROSIS 12/01/2007  . Hypothyroidism 08/06/2007  . DISEQUILIBRIUM 07/24/2007  . G E R D 06/02/2007  . THYROID NODULE, LEFT 04/10/2007  . Hyperlipidemia 04/10/2007  . COLONIC POLYPS, HX OF 04/10/2007   Past Medical History:  Diagnosis Date  . Acute diastolic heart failure ([place]) 11/03/2015  . Aortic stenosis, mild 08/15/2014   Echo June, 2015, mild aortic stenosis   . Atrial fibrillation ([place])    Dr Percival Spanish  . 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 Cardiorenal syndrome 11/05/2015  . CAROTID STENOSIS 08/08/2009   Qualifier: Diagnosis of  By: Percival Spanish, MD, Farrel Gordon    . Cerebrovascular accident (  [place]) 01/09  . Chronic anticoagulation 08/15/2014   Eliquis for A fib   . Complication of anesthesia    difficulty with arousal post op  . COPD, Gold grade A 04/08/2008   January 2016 pulmonary function testing> ratio 58% FEV1 1.83L (88% pred, no change with BD), TLC 5.78L (103% pred), DLCO 9.01 (31% pred) 07/2015 ONO RA > spO2 79% average 07/2015 ONO 2LPM> < 88% 189 min 08/2015 ONO 3LPM < 88% 69.8 min 09/2015 ONO 4LPM > OK   . Cystitis   .  Depression 09/22/2014  . Fracture 1995, 2009   RUE; LUE Dr. Durward Fortes  . G E R D 06/02/2007   Qualifier: Diagnosis of  By: Linna Darner MD, Gwyndolyn Saxon    . Hx of colonic polyps    last colonoscopy 2005, repeat was due 2010 Dr. Sharlett Iles  . Hyperlipidemia   . HYPERTENSION, ESSENTIAL NOS 12/01/2007   Qualifier: Diagnosis of  By: Linna Darner MD, Gwyndolyn Saxon    . Hypothyroidism   . Osteoporosis   . Spinal stenosis of lumbar region 01/10/2016  . Squamous cell carcinoma of neck 03/09/2014   3/15 Dr Wilhemina Bonito   . T12 compression fracture ([place]) 11/02/2015   Past Surgical History:  Procedure Laterality Date  . APPENDECTOMY    . CATARACT EXTRACTION     [place]il[place]  . COLONOSCOPY W/ POLYPECTOMY  2005   Diverticulosis;Dr. Sharlett Iles  . EXCISION METACARPAL MASS Right 08/17/2013   Procedure: RIGHT LONG EXCISION MASS AND DIP JOINT DEBRIDEMENT;  Surgeon: Tennis Must, MD;  Location: [facility] [facility];  Service: Orthopedics;  Laterality: Right;  . FEMORAL HERNIA REPAIR Right 08/17/2014   Procedure: OPEN REPAIR RIGHT FEMORAL HERNIA  WITH INSERTION OF MESH;  Surgeon: Adin Hector, MD;  Location: [facility];  Service: General;  Laterality: Right;  . INGUINAL HERNIA REPAIR Right 11/15/2014   Procedure: LAPAROSCOPIC RIGHT INGUINAL HERNIA WITH MESH;  Surgeon: Alphonsa Overall, MD;  Location: [facility];  Service: General;  Laterality: Right;  . INSERTION OF MESH  08/17/2014   Procedure: INSERTION OF MESH;  Surgeon: Adin Hector, MD;  Location: [facility];  Service: General;;  . MOHS SURGERY  2013   nasal Basal Cell; Dr Sarajane Jews  . Thyroid Needle Biopsy  2002  . TONSILLECTOMY AND ADENOIDECTOMY    . TOTAL HIP ARTHROPLASTY  2008   CHF & RAF post op   . TOTAL SHOULDER REPLACEMENT  2010   Dr Durward Fortes - left shoulder  . Vocal Cord Polyps      MEDICATIONS:   Current Facility-Administered Medications:  .  ezetimibe (ZETIA) tablet 10 mg, 10 mg, Oral, Daily, 10 mg at 12/01/16 1630 **OR** atorvastatin (LIPITOR) tablet 20 mg, 20 mg,  Oral, Daily, Ripudeep K Rai, MD, 20 mg at 12/03/16 1045 .  ceFEPIme (MAXIPIME) 1 g in dextrose 5 % 50 mL IVPB, 1 g, Intravenous, Q12H, Ripudeep K Rai, MD, 1 g at 12/03/16 1043 .  diltiazem (CARDIZEM CD) 24 hr capsule 120 mg, 120 mg, Oral, Daily, Ripudeep K Rai, MD, 120 mg at 12/03/16 1046 .  feeding supplement (ENSURE ENLIVE) (ENSURE ENLIVE) liquid 237 mL, 237 mL, Oral, BID BM, Clanford L Johnson, MD, 237 mL at 12/02/16 1704 .  ipratropium (ATROVENT) nebulizer solution 0.5 mg, 0.5 mg, Nebulization, TID PRN, Clanford L Johnson, MD .  levalbuterol (XOPENEX) nebulizer solution 0.63 mg, 0.63 mg, Nebulization, TID PRN, Clanford Marisa Hua, MD .  levothyroxine (SYNTHROID, LEVOTHROID) tablet 50 mcg, 50 mcg, Oral, Daily, Ripudeep Krystal Eaton, MD, 50  mcg at 12/03/16 1045 .  LORazepam (ATIVAN) tablet 0.5 mg, 0.5 mg, Oral, QHS PRN, Ripudeep K Rai, MD, 0.5 mg at 12/02/16 2123 .  MEDLINE mouth rinse, 15 mL, Mouth Rinse, BID, Ripudeep K Rai, MD .  methylPREDNISolone sodium succinate (SOLU-MEDROL) 40 mg/mL injection 40 mg, 40 mg, Intravenous, Q12H, Ripudeep K Rai, MD, 40 mg at 12/03/16 0423 .  mometasone-formoterol (DULERA) 100-5 MCG/ACT inhaler 2 puff, 2 puff, Inhalation, BID, Ripudeep Krystal Eaton, MD, 2 puff at 12/02/16 0738 .  morphine 2 MG/ML injection 0.5 mg, 0.5 mg, Intravenous, Q3H PRN, Clanford L Johnson, MD .  polyethylene glycol (MIRALAX / GLYCOLAX) packet 17 g, 17 g, Oral, Daily, Ripudeep K Rai, MD, 17 g at 12/03/16 1047 .  senna (SENOKOT) tablet 8.6 mg, 1 tablet, Oral, BID, Ripudeep K Rai, MD, 8.6 mg at 12/03/16 1046 .  sertraline (ZOLOFT) tablet 75 mg, 75 mg, Oral, Daily, Ripudeep K Rai, MD, 75 mg at 12/03/16 1046 .  sodium chloride HYPERTONIC 3 % nebulizer solution 4 mL, 4 mL, Nebulization, BID, Ripudeep K Rai, MD, 4 mL at 12/02/16 2033 .  timolol (TIMOPTIC) 0.5 % ophthalmic solution 1 drop, 1 drop, Both Eyes, Daily, Ripudeep K Rai, MD, 1 drop at 12/03/16 1048 .  vancomycin (VANCOCIN) 500 mg in sodium chloride  0.9 % 100 mL IVPB, 500 mg, Intravenous, Q24H, Christine E Shade, RPH, 500 mg at 12/02/16 1548  ALLERGIES:   Allergies  Allergen Reactions  . Hydrocodone Nausea And Vomiting  . Desmopressin Acetate     REACTION: swelling feet  . Dorzolamide Hcl Other (See Comments)    Burning in eyes and redness  . Prempro [Conj Estrog-Medroxyprogest Ace]     unknown  . Ciprofloxacin     REACTION: vaginal infection    REVIEW OF SYSTEMS: REVIEWED IN DETAIL IN CHART  FAMILY HISTORY:   Family History  Problem Relation Age of Onset  . Stroke Father     > 72  . Diabetes Father   . Leukemia Mother   . Cancer Mother     leukemia  . Colon cancer Brother     Valve replacement  . Heart attack Brother   . Breast cancer Sister   . Ovarian cancer Sister   . Lung cancer Sister     smoker  . Prostate cancer Brother   . Cancer Brother     bladder    SOCIAL HISTORY:   reports that she quit smoking about 22 years ago. Her smoking use included Cigarettes. She has a 45.00 pack-year smoking history. She has never used smokeless tobacco. She reports that she does not drink alcohol or use drugs.   EXAMINATION: Vital signs in last 24 hours: Temp:  [97.5 F (36.4 C)-98.6 F (37 C)] 97.5 F (36.4 C) (12/12 0550) Pulse Rate:  [72-98] 77 (12/12 0550) Resp:  [16-18] 18 (12/12 0550) BP: (92-142)/(55-83) 128/83 (12/12 0550) SpO2:  [90 %-97 %] 91 % (12/12 0550)  Head normocephalic Eyes pupils appear reactive with extraocular movement is intact Ears nose throat benign Cardiac regular rhythm and rate Lungs coarse breath sounds bilaterally Abdomen scaphoid with normal bowel sounds Neuro grossly intact CNS appears to be oriented 3  Musculoskeletal Exam  : Her hip is tender to palpation along the lateral aspect the trochanteric region. She does have some ecchymosis over this area. She does have pain with the motion of the hip pointing mainly at the trochanteric region. She is able to move her toes and foot  and ankle  without difficulty with good strength.   DIAGNOSTIC STUDIES: Recent laboratory studies:  Recent Labs  12/01/16 1355 12/02/16 0344 12/03/16 0543  WBC 13.8* 10.8* 13.8*  HGB 14.1 13.1 12.5  HCT 45.3 42.5 39.0  PLT 200 191 175    Recent Labs  12/01/16 1355 12/02/16 0344 12/03/16 0543  NA 140 141 140  K 3.3* 3.9 4.0  CL 99* 101 100*  CO2 32 30 32  BUN 32* 35* 44*  CREATININE 1.19* 1.17* 1.06*  GLUCOSE 122* 125* 133*  CALCIUM 8.6* 8.2* 9.0   Lab Results  Component Value Date   INR 1.18 12/01/2016   INR 1.47 11/03/2015   INR 1.23 04/06/2015   PROTIME 22.6 05/29/2009     Recent Radiographic Studies :  Dg Chest 2 View  Result Date: 12/01/2016 CLINICAL DATA:  [place] on RIGHT hip yesterday, history of RIGHT hip replacement EXAM: CHEST  2 VIEW COMPARISON:  07/09/2016 FINDINGS: Enlargement of cardiac silhouette with pulmonary vascular congestion. Atherosclerotic calcification aorta. Hazy RIGHT upper lobe infiltrate adjacent to minor fissure. Underlying emphysematous changes. Suspected infiltrate at LEFT base as well. Remaining lungs clear. No pleural effusion or pneumothorax. Diffuse osseous demineralization. RIGHT glenohumeral degenerative changes with prior LEFT shoulder joint replacement. IMPRESSION: Emphysematous changes with suspected RIGHT upper lobe and LEFT basilar infiltrates. Aortic atherosclerosis. Enlargement of cardiac silhouette with pulmonary vascular congestion. Electronically Signed   By: Lavonia Dana M.D.   On: 12/01/2016 12:43   Dg Cervical Spine Complete  Result Date: 11/18/2016 CLINICAL DATA:  Cervical pain from fall. EXAM: CERVICAL SPINE - COMPLETE 4+ VIEW COMPARISON:  None. FINDINGS: The pre odontoid space and prevertebral soft tissues are normal. Mild reversal of the normal lordosis centered at C4-5. No fractures are seen. The neural foramina are patent. There is mild narrowing of the left C5-6 neural foramen. Probable carotid calcifications in the  neck bilaterally. The lateral masses of C1 align with C2. The odontoid process is normal. The lung apices are unremarkable. IMPRESSION: Degenerative changes. Probable calcified atherosclerotic change in the carotid arteries. Electronically Signed   By: Dorise Bullion III M.D   On: 11/18/2016 15:56   Ct Femur Right Wo Contrast  Result Date: 12/01/2016 CLINICAL DATA:  Fall today. Abnormal hip radiograph indicating fracture. EXAM: CT OF THE LOWER RIGHT EXTREMITY WITHOUT CONTRAST TECHNIQUE: Multidetector CT imaging of the right lower extremity was performed according to the standard protocol. COMPARISON:  12/01/2016 FINDINGS: Oblique fracture through the base of the greater trochanter, with 2.8 cm proximal retraction. The resulting fragment measures 4.6 cm craniocaudad by 2.8 cm transverse by 4.4 cm anterior-posterior and has some minimal comminution. Approximately 14 cm of the stem is still imbedded in the shaft. I do not see a lesser trochanter fracture. No regional pelvic fracture or other fracture is identified. Osteoarthritis of the knee on the right side. IMPRESSION: 1. Fracture the greater trochanter of the right hip with about 2.7 cm proximal retraction of the proximal fragment, which is mildly comminuted. This strips some of the bone away from the proximal portion of the stem although 14 cm of the stem is still imbedded in the shaft of the femur. I do not see definite lesser trochanteric involvement or definite involvement of the medial bony margin of the proximal stem. 2. Right knee osteoarthritis. Electronically Signed   By: Van Clines M.D.   On: 12/01/2016 16:16   Dg Hip Unilat W Or Wo Pelvis 2-3 Views Right  Result Date: 12/01/2016 CLINICAL DATA:  Fall EXAM: DG  HIP (WITH OR WITHOUT PELVIS) 2-3V RIGHT COMPARISON:  None FINDINGS: Diffuse osseous demineralization. Components of RIGHT hip prosthesis identified. Acute fracture seen at the proximal RIGHT femur and extending from the greater  trochanter into the intertrochanteric region. Proximal femoral diaphysis appears intact. No dislocation of the RIGHT hip prosthesis. Minimal degenerative changes of LEFT hip joint and SI joints. Pelvis intact. IMPRESSION: RIGHT hip prosthesis with acute mildly displaced fracture involving the greater trochanter extending into the intertrochanteric region and involving the bones surrounding the proximal portion of the femoral component of the RIGHT hip prosthesis. Electronically Signed   By: Lavonia Dana M.D.   On: 12/01/2016 12:45    ASSESSMENT:  #1: Mildly displaced fracture of the greater trochanter #2: Total hip replacement on the right  PLAN: At this time our plan is to keep her bed to chair for several weeks. Physical therapy to teach bed to chair nonweightbearing versus toe touch weightbearing on the right. Follow-up in our office in 7-10 days for recheck evaluation and re-x-ray Biagio Borg 12/03/2016, 11:17 AM

## 2016-12-04 ENCOUNTER — Telehealth: Payer: Self-pay | Admitting: Pulmonary Disease

## 2016-12-04 DIAGNOSIS — J9621 Acute and chronic respiratory failure with hypoxia: Secondary | ICD-10-CM

## 2016-12-04 DIAGNOSIS — J189 Pneumonia, unspecified organism: Secondary | ICD-10-CM

## 2016-12-04 DIAGNOSIS — I482 Chronic atrial fibrillation: Secondary | ICD-10-CM

## 2016-12-04 DIAGNOSIS — S72001A Fracture of unspecified part of neck of right femur, initial encounter for closed fracture: Secondary | ICD-10-CM

## 2016-12-04 DIAGNOSIS — E039 Hypothyroidism, unspecified: Secondary | ICD-10-CM

## 2016-12-04 DIAGNOSIS — I10 Essential (primary) hypertension: Secondary | ICD-10-CM

## 2016-12-04 DIAGNOSIS — E785 Hyperlipidemia, unspecified: Secondary | ICD-10-CM

## 2016-12-04 DIAGNOSIS — Z66 Do not resuscitate: Secondary | ICD-10-CM

## 2016-12-04 MED ORDER — DOXYCYCLINE HYCLATE 100 MG PO TABS: 100.0000 mg | ORAL_TABLET | Freq: Two times a day (BID) | ORAL | 0 refills | Status: DC

## 2016-12-04 MED ORDER — TRIMETHOPRIM 100 MG PO TABS: 100.0000 mg | ORAL_TABLET | ORAL | Status: AC

## 2016-12-04 MED ORDER — PREDNISONE 20 MG PO TABS: 20.0000 mg | ORAL_TABLET | Freq: Every day | ORAL | 0 refills | Status: DC

## 2016-12-04 MED ORDER — LORAZEPAM 0.5 MG PO TABS: 0.5000 mg | ORAL_TABLET | Freq: Every evening | ORAL | 0 refills | Status: AC | PRN

## 2016-12-04 MED ORDER — SENNA 8.6 MG PO TABS: 1.0000 | ORAL_TABLET | Freq: Two times a day (BID) | ORAL | 0 refills | Status: AC

## 2016-12-04 MED ORDER — DOXYCYCLINE HYCLATE 100 MG PO TABS: 100.0000 mg | ORAL_TABLET | Freq: Two times a day (BID) | ORAL | 0 refills | Status: AC

## 2016-12-04 MED ORDER — LORAZEPAM 0.5 MG PO TABS: 0.5000 mg | ORAL_TABLET | Freq: Every evening | ORAL | 0 refills | Status: DC | PRN

## 2016-12-04 MED ORDER — TRAMADOL HCL 50 MG PO TABS: 50.0000 mg | ORAL_TABLET | Freq: Four times a day (QID) | ORAL | 0 refills | Status: DC | PRN

## 2016-12-04 MED ORDER — LEVALBUTEROL HCL 0.63 MG/3ML IN NEBU: 0.6300 mg | INHALATION_SOLUTION | Freq: Three times a day (TID) | RESPIRATORY_TRACT | 0 refills | Status: AC | PRN

## 2016-12-04 MED ORDER — PREDNISONE 20 MG PO TABS: 20.0000 mg | ORAL_TABLET | Freq: Every day | ORAL | 0 refills | Status: AC

## 2016-12-04 NOTE — Discharge Summary (Addendum)
Physician Discharge Summary  Kristine Simmons [id number] DOB: February 11, 1927 DOA: 12/01/2016  PCP: Binnie Rail, MD  Admit date: 12/01/2016 Discharge date: 12/04/2016  Time spent: 65 minutes  Recommendations for Outpatient Follow-up:  1. Patient to be discharged to skilled nursing facility with palliative care to follow-up the facility. 2. Patient is to follow-up with Dr. Durward Fortes orthopedics in 1 week for reevaluation of hip fracture and repeat x-rays. 3. Patient is to follow-up with M.D. at the skilled nursing facility.   Discharge Diagnoses:  Principal Problem:   Acute on chronic respiratory failure with hypoxia (HCC) Active Problems:   Hypothyroidism   Hyperlipidemia   Essential hypertension   HCAP (healthcare-associated pneumonia)   Hip fracture (HCC)   Chronic atrial fibrillation (HCC)   DNR (do not resuscitate)   Hospice care patient   Closed fracture of right hip [facility])   Discharge Condition: Stable and improved  Diet recommendation: Regular  Filed Weights   12/01/16 1725  Weight: 56.5 kg (124 lb 9 oz)    History of present illness:  Per Dr Tana Coast Patient is a 82 year old female with diastolic CHF, aortic stenosis, atrial fibrillation, COPD, osteoporosis, hypothyroidism presented from [facility] independent living facility after a fall. History was obtained from the patient and her family at bedside. Patient was currently in hospice for chronic respiratory failure for one year. Patient had a mechanical fall on the day of admission, after tripping over a door entry and landed on her right hip. She denied any loss of consciousness, syncopal episode. The patient's family, she was also having cough and congestion for last 3 days and was started on antibiotics, prednisone by hospice nurse. They do not know if she had any fevers or chills or any chest pain.  ED work-up/course:  Temp 98.6 S2 rate 14, heart rate 113 BP 91/57, O2 sats 85% on 6 L O2 via nasal  cannula Sodium 140, potassium 3.3, BUN 32, creatinine 1.1, lactic acid 2.76 WBC is 13.8, hemoglobin 14.1, hematocrit 45.3 Chest x-ray showed emphysematous changes with suspected right upper lobe and left basilar infiltrates, enlargement of cardiac silhouette with pulmonary vascular congestion CT femur right showed fracture of the greater trochanter of the right hip with about 2.7 cm proximal retraction of the proximal fragment which is mildly comminuted   Hospital Course:  Acute on chronic respiratory failure with hypoxiasecondary to COPD and possible HCAP - Patient had presented after a fall with cough congestion which was started 3 days prior to admission and initially placed on IV vancomycin and cefepime per pneumonia protocol for healthcare associated pneumonia. Patient was also placed on nebulizer treatments and IV Solu-Medrol for probable COPD exacerbation. Urine Legionella antigen came back negative. Urine pneumococcus antigen came back negative. Patient was subsequently transitioned to oral doxycycline which she tolerated. Patient will be discharged to a skilled nursing facility with palliative care following on 5 more days of oral doxycycline to complete a one-week was of antibiotic treatment as well as a slow prednisone taper. Patient will be followed by palliative care and outpatient setting. Patient was on 6 L nasal cannula prior to admission.   Hypothyroidism - Continued on home regimen of Synthroid  Essential hypertension - continued on Cardizem  Right Hip fracture ([place]) - No surgery required per orthopedics consultation. Per orthopedics Physical therapy to teach bed to chair ,nonweightbearing versus toe touch weightbearing on the right. Patient is to follow-up with orthopedics one week post discharge for reevaluation and repeat x-rays. Patient's anticoagulation was resumed after  he was noted that orthopedics had recommended conservative treatment at this time. - High  risk for surgery given acute on chronic respiratory failure, on 6 L O2  Chronic atrial fibrillation - Patient was initially placed on IV cardizem and subsequently transitionmed back to home dose Cardizem. -Patient's anticoagulation of eliquis was held initially and subsequently resumed after patient was seen in consultation by orthopedics who had recommended conservative treatment. Patient's rate remained well-controlled and patient will be discharged to a skilled nursing facility in stable condition.  DVT prophylaxis: Resuming eliquis   Procedures:  CT right femur 12/01/2016--- mildly displaced fracture greater trochanter of the right hip. Total hip replacement right  Chest x-ray 12/01/2016  Plain films of the right hip 12/01/2016  Consultations:  Dr. Durward Fortes 12/03/2016  Discharge Exam: Vitals:   12/04/16 1030 12/04/16 1523  BP: 126/78 (!) 96/59  Pulse: 85 96  Resp:    Temp:  97.7 F (36.5 C)    General: NAD Cardiovascular: Irregularly irregular. Respiratory: CTAB. No wheezing. Poor-fair air movement.  Discharge Instructions   Discharge Instructions    Diet general    Complete by:  As directed    Increase activity slowly    Complete by:  As directed    Increase activity slowly    Complete by:  As directed    Bed to chair transfers. PT to teach bed to chair non weightbearing vs toe touch down weight bearing on right.     Current Discharge Medication List    START taking these medications   Details  doxycycline (VIBRA-TABS) 100 MG tablet Take 1 tablet (100 mg total) by mouth every 12 (twelve) hours. Take for 5 days then stop. Qty: 10 tablet, Refills: 0    levalbuterol (XOPENEX) 0.63 MG/3ML nebulizer solution Take 3 mLs (0.63 mg total) by nebulization 3 (three) times daily as needed for wheezing or shortness of breath. Qty: 3 mL, Refills: 0    senna (SENOKOT) 8.6 MG TABS tablet Take 1 tablet (8.6 mg total) by mouth 2 (two) times daily. Qty: 30 each,  Refills: 0      CONTINUE these medications which have CHANGED   Details  LORazepam (ATIVAN) 0.5 MG tablet Take 1 tablet (0.5 mg total) by mouth at bedtime as needed for anxiety. Qty: 10 tablet, Refills: 0    predniSONE (DELTASONE) 20 MG tablet Take 1-2 tablets (20-40 mg total) by mouth daily with breakfast. Take 2 tablets (40mg ) daily x 4 days, then 1 tablet (20mg ) daily x 4 days then stop. Qty: 12 tablet, Refills: 0    traMADol (ULTRAM) 50 MG tablet Take 1 tablet (50 mg total) by mouth every 6 (six) hours as needed. Qty: 20 tablet, Refills: 0    trimethoprim (TRIMPEX) 100 MG tablet Take 1 tablet (100 mg total) by mouth every other day. Resume after doxycycline has been completed.      CONTINUE these medications which have NOT CHANGED   Details  acetaminophen (TYLENOL) 500 MG tablet Take 500-1,000 mg by mouth every 6 (six) hours as needed.    albuterol (ACCUNEB) 1.25 MG/3ML nebulizer solution Take 1 ampule by nebulization every 6 (six) hours as needed for wheezing.    albuterol (VENTOLIN HFA) 108 (90 BASE) MCG/ACT inhaler Inhale 2 puffs into the lungs every 4 (four) hours as needed for wheezing or shortness of breath.     apixaban (ELIQUIS) 2.5 MG TABS tablet Take 2.5 mg by mouth 2 (two) times daily.    diltiazem (DILTIAZEM CD) 120 MG 24  hr capsule Take 120 mg by mouth daily.    ENSURE PLUS (ENSURE PLUS) LIQD TAKE ONE CAN BY MOUTH ONCE DAILY Qty: 237 mL, Refills: 5    estradiol (ESTRACE) 0.1 MG/GM vaginal cream Place 2 g vaginally 2 (two) times a week. On Wednesdays & Saturdays.    ezetimibe-simvastatin (VYTORIN) 10-40 MG tablet Take 1 tablet by mouth daily.    furosemide (LASIX) 40 MG tablet TAKE 1 TABLET ONCE DAILY. Qty: 30 tablet, Refills: 0    levothyroxine (SYNTHROID, LEVOTHROID) 50 MCG tablet TAKE 1 TABLET ONCE DAILY. Qty: 90 tablet, Refills: 1    OXYGEN Inhale into the lungs continuous. Sitting or resting flow should be on 4 L  And during activities use 5-6 L     polyethylene glycol powder (GLYCOLAX/MIRALAX) powder Take 17 g by mouth daily. Mix 1 capful of powder and dissolve it in any liquid. Drink once daily. Qty: 255 g, Refills: 0    Probiotic Product (MISC INTESTINAL FLORA REGULAT) CAPS Take 1 capsule by mouth daily.      !! sertraline (ZOLOFT) 25 MG tablet TAKE ONE TABLET EACH DAY WITH 50MG  TO GET TOTAL DOSE OF 75MG . Qty: 90 tablet, Refills: 1    !! sertraline (ZOLOFT) 50 MG tablet TAKE 1 TABLET EACH DAY. Qty: 30 tablet, Refills: 0    sodium chloride HYPERTONIC 3 % nebulizer solution Take by nebulization 2 (two) times daily. Qty: 360 mL, Refills: 5    SYMBICORT 80-4.5 MCG/ACT inhaler USE 2 PUFFS TWICE DAILY. Qty: 10.2 g, Refills: 2    timolol (TIMOPTIC) 0.5 % ophthalmic solution Place 1 drop into both eyes daily.     Spacer/Aero-Holding Chambers (AEROCHAMBER MV) inhaler Use as instructed Qty: 1 each, Refills: 0     !! - Potential duplicate medications found. Please discuss with provider.    STOP taking these medications     doxycycline (VIBRAMYCIN) 100 MG capsule        Allergies  Allergen Reactions  . Hydrocodone Nausea And Vomiting  . Desmopressin Acetate     REACTION: swelling feet  . Dorzolamide Hcl Other (See Comments)    Burning in eyes and redness  . Prempro [Conj Estrog-Medroxyprogest Ace]     unknown  . Ciprofloxacin     REACTION: vaginal infection   Follow-up Information    Palliative Care Follow up.   Why:  Palliative Care to follow up at facility.       MD AT SNF Follow up.   Why:  f/u with [facility]       Garald Balding, MD. Schedule an appointment as soon as possible for a visit in 1 week(s).   Specialty:  Orthopedic Surgery Contact information: [address] [place] [address] [phone]            The results of significant diagnostics from this hospitalization (including imaging, microbiology, ancillary and laboratory) are listed below for reference.    Significant  Diagnostic Studies: Dg Chest 2 View  Result Date: 12/01/2016 CLINICAL DATA:  [place] on RIGHT hip yesterday, history of RIGHT hip replacement EXAM: CHEST  2 VIEW COMPARISON:  07/09/2016 FINDINGS: Enlargement of cardiac silhouette with pulmonary vascular congestion. Atherosclerotic calcification aorta. Hazy RIGHT upper lobe infiltrate adjacent to minor fissure. Underlying emphysematous changes. Suspected infiltrate at LEFT base as well. Remaining lungs clear. No pleural effusion or pneumothorax. Diffuse osseous demineralization. RIGHT glenohumeral degenerative changes with prior LEFT shoulder joint replacement. IMPRESSION: Emphysematous changes with suspected RIGHT upper lobe and LEFT basilar  infiltrates. Aortic atherosclerosis. Enlargement of cardiac silhouette with pulmonary vascular congestion. Electronically Signed   By: Lavonia Dana M.D.   On: 12/01/2016 12:43   Dg Cervical Spine Complete  Result Date: 11/18/2016 CLINICAL DATA:  Cervical pain from fall. EXAM: CERVICAL SPINE - COMPLETE 4+ VIEW COMPARISON:  None. FINDINGS: The pre odontoid space and prevertebral soft tissues are normal. Mild reversal of the normal lordosis centered at C4-5. No fractures are seen. The neural foramina are patent. There is mild narrowing of the left C5-6 neural foramen. Probable carotid calcifications in the neck bilaterally. The lateral masses of C1 align with C2. The odontoid process is normal. The lung apices are unremarkable. IMPRESSION: Degenerative changes. Probable calcified atherosclerotic change in the carotid arteries. Electronically Signed   By: Dorise Bullion III M.D   On: 11/18/2016 15:56   Ct Femur Right Wo Contrast  Result Date: 12/01/2016 CLINICAL DATA:  Fall today. Abnormal hip radiograph indicating fracture. EXAM: CT OF THE LOWER RIGHT EXTREMITY WITHOUT CONTRAST TECHNIQUE: Multidetector CT imaging of the right lower extremity was performed according to the standard protocol. COMPARISON:  12/01/2016  FINDINGS: Oblique fracture through the base of the greater trochanter, with 2.8 cm proximal retraction. The resulting fragment measures 4.6 cm craniocaudad by 2.8 cm transverse by 4.4 cm anterior-posterior and has some minimal comminution. Approximately 14 cm of the stem is still imbedded in the shaft. I do not see a lesser trochanter fracture. No regional pelvic fracture or other fracture is identified. Osteoarthritis of the knee on the right side. IMPRESSION: 1. Fracture the greater trochanter of the right hip with about 2.7 cm proximal retraction of the proximal fragment, which is mildly comminuted. This strips some of the bone away from the proximal portion of the stem although 14 cm of the stem is still imbedded in the shaft of the femur. I do not see definite lesser trochanteric involvement or definite involvement of the medial bony margin of the proximal stem. 2. Right knee osteoarthritis. Electronically Signed   By: Van Clines M.D.   On: 12/01/2016 16:16   Dg Hip Unilat W Or Wo Pelvis 2-3 Views Right  Result Date: 12/01/2016 CLINICAL DATA:  Fall EXAM: DG HIP (WITH OR WITHOUT PELVIS) 2-3V RIGHT COMPARISON:  None FINDINGS: Diffuse osseous demineralization. Components of RIGHT hip prosthesis identified. Acute fracture seen at the proximal RIGHT femur and extending from the greater trochanter into the intertrochanteric region. Proximal femoral diaphysis appears intact. No dislocation of the RIGHT hip prosthesis. Minimal degenerative changes of LEFT hip joint and SI joints. Pelvis intact. IMPRESSION: RIGHT hip prosthesis with acute mildly displaced fracture involving the greater trochanter extending into the intertrochanteric region and involving the bones surrounding the proximal portion of the femoral component of the RIGHT hip prosthesis. Electronically Signed   By: Lavonia Dana M.D.   On: 12/01/2016 12:45    Microbiology: Recent Results (from the past 240 hour(s))  Blood culture (routine x 2)      Status: None (Preliminary result)   Collection Time: 12/01/16  1:40 PM  Result Value Ref Range Status   Specimen Description BLOOD LEFT HAND  5 ML IN [facility] BOTTLE  Final   Special Requests NONE  Final   Culture   Final    NO GROWTH 2 DAYS Performed at [facility]    Report Status PENDING  Incomplete  Blood culture (routine x 2)     Status: None (Preliminary result)   Collection Time: 12/01/16  1:50 PM  Result  Value Ref Range Status   Specimen Description BLOOD LEFT HAND  3 ML IN YELLOW  Final   Special Requests NONE  Final   Culture   Final    NO GROWTH 2 DAYS Performed at [facility]    Report Status PENDING  Incomplete  MRSA PCR Screening     Status: None   Collection Time: 12/01/16  4:40 PM  Result Value Ref Range Status   MRSA by PCR NEGATIVE NEGATIVE Final    Comment:        The GeneXpert MRSA Assay (FDA approved for NASAL specimens only), is one component of a comprehensive MRSA colonization surveillance program. It is not intended to diagnose MRSA infection nor to guide or monitor treatment for MRSA infections.      Labs: Basic Metabolic Panel:  Recent Labs Lab 12/01/16 1355 12/02/16 0344 12/03/16 0543  NA 140 141 140  K 3.3* 3.9 4.0  CL 99* 101 100*  CO2 32 30 32  GLUCOSE 122* 125* 133*  BUN 32* 35* 44*  CREATININE 1.19* 1.17* 1.06*  CALCIUM 8.6* 8.2* 9.0   Liver Function Tests:  Recent Labs Lab 12/01/16 1355  AST 65*  ALT 31  ALKPHOS 41  BILITOT 1.0  PROT 6.4*  ALBUMIN 3.9   No results for input(s): LIPASE, AMYLASE in the last 168 hours. No results for input(s): AMMONIA in the last 168 hours. CBC:  Recent Labs Lab 12/01/16 1355 12/02/16 0344 12/03/16 0543  WBC 13.8* 10.8* 13.8*  NEUTROABS 10.5*  --  11.8*  HGB 14.1 13.1 12.5  HCT 45.3 42.5 39.0  MCV 86.9 87.6 83.2  PLT 200 191 175   Cardiac Enzymes: No results for input(s): CKTOTAL, CKMB, CKMBINDEX, TROPONINI in the last 168 hours. BNP: BNP (last 3  results)  Recent Labs  06/10/16 1356 07/06/16 1315 07/07/16 0446  BNP 734.1* 827.2* 713.4*    ProBNP (last 3 results) No results for input(s): PROBNP in the last 8760 hours.  CBG: No results for input(s): GLUCAP in the last 168 hours.     SignedIrine Seal MD.  [facility] 12/04/2016, 3:40 PM

## 2016-12-04 NOTE — Clinical Social Work Placement (Signed)
   CLINICAL SOCIAL WORK PLACEMENT  NOTE  Date:  12/04/2016  Patient Details  Name: Kristine Simmons MRN: [id number] Date of Birth: 04/28/1927  Clinical Social Work is seeking post-discharge placement for this patient at the [facility] level of care (*CSW will initial, date and re-position this form in  chart as items are completed):  Yes   Patient/family provided with [facility] Work Department's list of facilities offering this level of care within the geographic area requested by the patient (or if unable, by the patient's family).  Yes   Patient/family informed of their freedom to choose among providers that offer the needed level of care, that participate in Medicare, Medicaid or managed care program needed by the patient, have an available bed and are willing to accept the patient.  Yes   Patient/family informed of [facility]'s ownership interest in [facility] and [facility], as well as of the fact that they are under no obligation to receive care at these facilities.  PASRR submitted to EDS on       PASRR number received on       Existing PASRR number confirmed on 12/04/16     FL2 transmitted to all facilities in geographic area requested by pt/family on       FL2 transmitted to all facilities within larger geographic area on 12/03/16     Patient informed that his/her managed care company has contracts with or will negotiate with certain facilities, including the following:        Yes   Patient/family informed of bed offers received.  Patient chooses bed at [facility]     Physician recommends and patient chooses bed at [facility]    Patient to be transferred to [facility] on 12/04/16.  Patient to be transferred to facility by [facility]     Patient family notified on 12/04/16 of transfer.  Name of family member notified:  Daughter:Dotty      PHYSICIAN Please prepare priority discharge summary, including medications, Please  sign DNR     Additional Comment:    _______________________________________________ Lia Hopping, LCSW 12/04/2016, 11:28 AM

## 2016-12-04 NOTE — Telephone Encounter (Signed)
Called and left message advising this message would be sent to Dr. Gardner Candle

## 2016-12-04 NOTE — Social Work (Signed)
[facility]) Social Work Note - SW met with pt and her husband, Rush Landmark.  SW provided education regarding discharging from [facility] in order to utilize her Medicare skilled benefit at [facility] for rehabilitation.  Reviewed discharge paperwork from [facility].  Attempted to contact pt's daughter, Vickii Penna, and left a vm.  SW also left a vm for hospital SW, [place].  Please contact [facility] with any questions. Jasmine Awe, LCSW [phone]

## 2016-12-05 ENCOUNTER — Telehealth: Payer: Self-pay | Admitting: *Deleted

## 2016-12-05 ENCOUNTER — Non-Acute Institutional Stay (SKILLED_NURSING_FACILITY): Payer: Medicare Other | Admitting: Internal Medicine

## 2016-12-05 ENCOUNTER — Encounter: Payer: Self-pay | Admitting: Internal Medicine

## 2016-12-05 DIAGNOSIS — I482 Chronic atrial fibrillation, unspecified: Secondary | ICD-10-CM

## 2016-12-05 DIAGNOSIS — I5032 Chronic diastolic (congestive) heart failure: Secondary | ICD-10-CM | POA: Diagnosis not present

## 2016-12-05 DIAGNOSIS — N289 Disorder of kidney and ureter, unspecified: Secondary | ICD-10-CM

## 2016-12-05 DIAGNOSIS — D638 Anemia in other chronic diseases classified elsewhere: Secondary | ICD-10-CM

## 2016-12-05 DIAGNOSIS — J441 Chronic obstructive pulmonary disease with (acute) exacerbation: Secondary | ICD-10-CM

## 2016-12-05 DIAGNOSIS — D72828 Other elevated white blood cell count: Secondary | ICD-10-CM | POA: Diagnosis not present

## 2016-12-05 DIAGNOSIS — E43 Unspecified severe protein-calorie malnutrition: Secondary | ICD-10-CM

## 2016-12-05 DIAGNOSIS — R531 Weakness: Secondary | ICD-10-CM

## 2016-12-05 DIAGNOSIS — F329 Major depressive disorder, single episode, unspecified: Secondary | ICD-10-CM

## 2016-12-05 DIAGNOSIS — F32A Depression, unspecified: Secondary | ICD-10-CM

## 2016-12-05 DIAGNOSIS — J9612 Chronic respiratory failure with hypercapnia: Secondary | ICD-10-CM

## 2016-12-05 DIAGNOSIS — N390 Urinary tract infection, site not specified: Secondary | ICD-10-CM

## 2016-12-05 DIAGNOSIS — E039 Hypothyroidism, unspecified: Secondary | ICD-10-CM

## 2016-12-05 DIAGNOSIS — S72001S Fracture of unspecified part of neck of right femur, sequela: Secondary | ICD-10-CM

## 2016-12-05 DIAGNOSIS — F419 Anxiety disorder, unspecified: Secondary | ICD-10-CM

## 2016-12-05 DIAGNOSIS — J189 Pneumonia, unspecified organism: Secondary | ICD-10-CM | POA: Diagnosis not present

## 2016-12-05 DIAGNOSIS — K5909 Other constipation: Secondary | ICD-10-CM

## 2016-12-05 DIAGNOSIS — E785 Hyperlipidemia, unspecified: Secondary | ICD-10-CM

## 2016-12-05 NOTE — Telephone Encounter (Signed)
Pt was on TCM list admitted for  Acute on chronic respiratory failure with hypoxia (Kristine Simmons). Pt was D/C 12/13, and sent to SNF...Kristine Simmons

## 2016-12-05 NOTE — Progress Notes (Signed)
LOCATION: [facility]  PCP: Binnie Rail, MD   Code Status: DNR  Goals of care: Advanced Directive information Advanced Directives 12/05/2016  Does Patient Have a Medical Advance Directive? Yes  Type of Advance Directive Out of facility DNR (pink MOST or yellow form)  Does patient want to make changes to medical advance directive? No - Patient declined  Copy of [place] in Chart? -  Would patient like information on creating a medical advance directive? -       Extended Emergency Contact Information Primary Emergency Contact: Starry,Billy J Address: [address]          [place], [place] [address] Johnnette Litter of [place] Phone: [phone] Relation: Spouse Secondary Emergency Contact: Veto Kemps [place] of [place] Phone: [phone] Mobile Phone: [phone] Relation: Daughter   Allergies  Allergen Reactions  . Hydrocodone Nausea And Vomiting  . Desmopressin Acetate     REACTION: swelling feet  . Dorzolamide Hcl Other (See Comments)    Burning in eyes and redness  . Prempro [Conj Estrog-Medroxyprogest Ace]     unknown  . Ciprofloxacin     REACTION: vaginal infection    Chief Complaint  Patient presents with  . New Admit To SNF    New Admission Visit     HPI:  Patient is a 82 y.o. female seen today for short term rehabilitation post hospital admission from 12/01/16-12/04/16 post fall with right hip fracture. She had acute on chronic respiratory failure from HCAP and COPD exacerbation. She was seen by orthopedics and no surgical intervention was recommended. She received iv antibiotic and steroid. She was being followed by palliative care team prior to this admission. She is seen in her room today.    Review of Systems:  Constitutional: Negative for fever, chills. She feels weak and tired. HENT: Negative for headache, congestion, nasal discharge, sore throat, difficulty swallowing.   Eyes: Negative for double  vision and discharge. Wears glasses.   Respiratory: Negative for wheezing. Positive for cough and chronic shortness of breath.  Cardiovascular: Negative for chest pain, palpitations, leg swelling.  Gastrointestinal: Negative for heartburn, nausea, vomiting, abdominal pain. she does not remember her last bowel movement.  Genitourinary: Negative for dysuria and flank pain.  Musculoskeletal: Negative for fall in the facility.  Skin: Negative for itching, rash.  Neurological: Negative for dizziness. Psychiatric/Behavioral: Negative for depression.   Past Medical History:  Diagnosis Date  . Acute diastolic heart failure ([place]) 11/03/2015  . Aortic stenosis, mild 08/15/2014   Echo June, 2015, mild aortic stenosis   . Atrial fibrillation ([place])    Dr Percival Spanish  . 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 Cardiorenal syndrome 11/05/2015  . CAROTID STENOSIS 08/08/2009   Qualifier: Diagnosis of  By: Percival Spanish, MD, Farrel Gordon    . Cerebrovascular accident ([place]) 01/09  . Chronic anticoagulation 08/15/2014   Eliquis for A fib   . Complication of anesthesia    difficulty with arousal post op  . COPD, Gold grade A 04/08/2008   January 2016 pulmonary function testing> ratio 58% FEV1 1.83L (88% pred, no change with BD), TLC 5.78L (103% pred), DLCO 9.01 (31% pred) 07/2015 ONO RA > spO2 79% average 07/2015 ONO 2LPM> < 88% 189 min 08/2015 ONO 3LPM < 88% 69.8 min 09/2015 ONO 4LPM > OK   . Cystitis   . Depression 09/22/2014  . Fracture 1995, 2009   RUE; LUE Dr. Durward Fortes  . G  E R D 06/02/2007   Qualifier: Diagnosis of  By: Linna Darner MD, Gwyndolyn Saxon    . Hx of colonic polyps    last colonoscopy 2005, repeat was due 2010 Dr. Sharlett Iles  . Hyperlipidemia   . HYPERTENSION, ESSENTIAL NOS 12/01/2007   Qualifier: Diagnosis of  By: Linna Darner MD, Gwyndolyn Saxon    . Hypothyroidism   . Osteoporosis   . Spinal stenosis of lumbar region 01/10/2016  . Squamous cell carcinoma of neck 03/09/2014   3/15 Dr Wilhemina Bonito   . T12  compression fracture ([place]) 11/02/2015   Past Surgical History:  Procedure Laterality Date  . APPENDECTOMY    . CATARACT EXTRACTION     [facility]  . COLONOSCOPY W/ POLYPECTOMY  2005   Diverticulosis;Dr. Sharlett Iles  . EXCISION METACARPAL MASS Right 08/17/2013   Procedure: RIGHT LONG EXCISION MASS AND DIP JOINT DEBRIDEMENT;  Surgeon: Tennis Must, MD;  Location: [facility];  Service: Orthopedics;  Laterality: Right;  . FEMORAL HERNIA REPAIR Right 08/17/2014   Procedure: OPEN REPAIR RIGHT FEMORAL HERNIA  WITH INSERTION OF MESH;  Surgeon: Adin Hector, MD;  Location: [facility];  Service: General;  Laterality: Right;  . INGUINAL HERNIA REPAIR Right 11/15/2014   Procedure: LAPAROSCOPIC RIGHT INGUINAL HERNIA WITH MESH;  Surgeon: Alphonsa Overall, MD;  Location: [facility];  Service: General;  Laterality: Right;  . INSERTION OF MESH  08/17/2014   Procedure: INSERTION OF MESH;  Surgeon: Adin Hector, MD;  Location: [facility];  Service: General;;  . MOHS SURGERY  2013   nasal Basal Cell; Dr Sarajane Jews  . Thyroid Needle Biopsy  2002  . TONSILLECTOMY AND ADENOIDECTOMY    . TOTAL HIP ARTHROPLASTY  2008   CHF & RAF post op   . TOTAL SHOULDER REPLACEMENT  2010   Dr Durward Fortes - left shoulder  . Vocal Cord Polyps     Social History:   reports that she quit smoking about 22 years ago. Her smoking use included Cigarettes. She has a 45.00 pack-year smoking history. She has never used smokeless tobacco. She reports that she does not drink alcohol or use drugs.  Family History  Problem Relation Age of Onset  . Stroke Father     > 91  . Diabetes Father   . Leukemia Mother   . Cancer Mother     leukemia  . Colon cancer Brother     Valve replacement  . Heart attack Brother   . Breast cancer Sister   . Ovarian cancer Sister   . Lung cancer Sister     smoker  . Prostate cancer Brother   . Cancer Brother     bladder    Medications:   Medication List       Accurate as of 12/05/16 11:10 AM.  Always use your most recent med list.          acetaminophen 500 MG tablet Commonly known as:  TYLENOL Take 500-1,000 mg by mouth every 6 (six) hours as needed.   DILTIAZEM CD 120 MG 24 hr capsule Generic drug:  diltiazem Take 120 mg by mouth daily.   doxycycline 100 MG tablet Commonly known as:  VIBRA-TABS Take 1 tablet (100 mg total) by mouth every 12 (twelve) hours. Take for 5 days then stop.   ELIQUIS 2.5 MG Tabs tablet Generic drug:  apixaban Take 2.5 mg by mouth 2 (two) times daily.   ENSURE PLUS Liqd TAKE ONE CAN BY MOUTH ONCE DAILY   estradiol 0.1 MG/GM vaginal  cream Commonly known as:  ESTRACE Place 2 g vaginally 2 (two) times a week. On Wednesdays & Saturdays.   ezetimibe-simvastatin 10-40 MG tablet Commonly known as:  VYTORIN Take 1 tablet by mouth daily.   furosemide 40 MG tablet Commonly known as:  LASIX TAKE 1 TABLET ONCE DAILY.   levalbuterol 0.63 MG/3ML nebulizer solution Commonly known as:  XOPENEX Take 3 mLs (0.63 mg total) by nebulization 3 (three) times daily as needed for wheezing or shortness of breath.   levothyroxine 50 MCG tablet Commonly known as:  SYNTHROID, LEVOTHROID TAKE 1 TABLET ONCE DAILY.   LORazepam 0.5 MG tablet Commonly known as:  ATIVAN Take 1 tablet (0.5 mg total) by mouth at bedtime as needed for anxiety.   Misc Intestinal Flora Regulat Caps Take 1 capsule by mouth daily.   OXYGEN Inhale into the lungs continuous. Sitting or resting flow should be on 4 L  And during activities use 5-6 L   polyethylene glycol powder powder Commonly known as:  GLYCOLAX/MIRALAX Take 17 g by mouth daily. Mix 1 capful of powder and dissolve it in any liquid. Drink once daily.   predniSONE 20 MG tablet Commonly known as:  DELTASONE Take 1-2 tablets (20-40 mg total) by mouth daily with breakfast. Take 2 tablets (40mg ) daily x 4 days, then 1 tablet (20mg ) daily x 4 days then stop.   senna 8.6 MG Tabs tablet Commonly known as:  SENOKOT Take  1 tablet (8.6 mg total) by mouth 2 (two) times daily.   sertraline 25 MG tablet Commonly known as:  ZOLOFT TAKE ONE TABLET EACH DAY WITH 50MG  TO GET TOTAL DOSE OF 75MG .   sertraline 50 MG tablet Commonly known as:  ZOLOFT TAKE 1 TABLET EACH DAY.   sodium chloride HYPERTONIC 3 % nebulizer solution Take by nebulization 2 (two) times daily.   SYMBICORT 80-4.5 MCG/ACT inhaler Generic drug:  budesonide-formoterol USE 2 PUFFS TWICE DAILY.   timolol 0.5 % ophthalmic solution Commonly known as:  TIMOPTIC Place 1 drop into both eyes daily.   traMADol 50 MG tablet Commonly known as:  ULTRAM Take 1 tablet (50 mg total) by mouth every 6 (six) hours as needed.   trimethoprim 100 MG tablet Commonly known as:  TRIMPEX Take 1 tablet (100 mg total) by mouth every other day. Resume after doxycycline has been completed. Start taking on:  12/10/2016   VENTOLIN HFA 108 (90 Base) MCG/ACT inhaler Generic drug:  albuterol Inhale 2 puffs into the lungs every 4 (four) hours as needed for wheezing or shortness of breath.   albuterol 1.25 MG/3ML nebulizer solution Commonly known as:  ACCUNEB Take 1 ampule by nebulization every 6 (six) hours as needed for wheezing.       Immunizations: Immunization History  Administered Date(s) Administered  . Influenza Split 09/06/2013, 09/06/2014  . Influenza Whole 09/10/2011  . Influenza-Unspecified 09/14/2015, 09/15/2016  . Pneumococcal Conjugate-13 09/21/2015  . Pneumococcal Polysaccharide-23 10/11/2003  . Td 02/23/1997, 10/21/2007  . Zoster 05/01/2011     Physical Exam: Vitals:   12/05/16 1042  BP: 121/77  Pulse: 96  Resp: 20  Temp: (!) 96 F (35.6 C)  TempSrc: Oral  SpO2: 90%  Weight: 124 lb 8 oz (56.5 kg)  Height: 5\' 7"  (1.702 m)   Body mass index is 19.5 kg/m.  General- elderly female, frail and thin built, in no acute distress Head- normocephalic, atraumatic Nose- no maxillary or frontal sinus tenderness, no nasal  discharge Throat- moist mucus membrane Eyes- PERRLA, EOMI, no  pallor, no icterus, no discharge, normal conjunctiva, normal sclera Neck- no cervical lymphadenopathy Cardiovascular- irregular heart rate, no murmur Respiratory- bilateral poor air movement, no wheeze, no rhonchi, no crackles, no use of accessory muscles, on 6 liters o2 by nasal canula Abdomen- bowel sounds present, soft, non tender Musculoskeletal- able to move all 4 extremities, generalized weakness, arthritis changes to her fingers, no leg edema Neurological- alert and oriented to self and place only Skin- warm and dry, easy bruising Psychiatry- normal mood and affect    Labs reviewed: Basic Metabolic Panel:  Recent Labs  06/10/16 1727  07/07/16 0446  12/01/16 1355 12/02/16 0344 12/03/16 0543  NA  --   < > 140  < > 140 141 140  K  --   < > 3.6  < > 3.3* 3.9 4.0  CL  --   < > 98*  < > 99* 101 100*  CO2  --   < > 32  < > 32 30 32  GLUCOSE  --   < > 99  < > 122* 125* 133*  BUN  --   < > 19  < > 32* 35* 44*  CREATININE  --   < > 1.01*  < > 1.19* 1.17* 1.06*  CALCIUM  --   < > 8.6*  < > 8.6* 8.2* 9.0  MG 1.9  --  1.8  --   --   --   --   PHOS 3.7  --   --   --   --   --   --   < > = values in this interval not displayed. Liver Function Tests:  Recent Labs  01/10/16 1519 07/06/16 1315 12/01/16 1355  AST 23 41 65*  ALT 14 23 31   ALKPHOS 51 47 41  BILITOT 1.0 1.2 1.0  PROT 6.8 6.9 6.4*  ALBUMIN 4.0 4.0 3.9   No results for input(s): LIPASE, AMYLASE in the last 8760 hours. No results for input(s): AMMONIA in the last 8760 hours. CBC:  Recent Labs  07/06/16 1315  12/01/16 1355 12/02/16 0344 12/03/16 0543  WBC 7.8  < > 13.8* 10.8* 13.8*  NEUTROABS 5.4  --  10.5*  --  11.8*  HGB 14.3  < > 14.1 13.1 12.5  HCT 44.3  < > 45.3 42.5 39.0  MCV 84.2  < > 86.9 87.6 83.2  PLT 183  < > 200 191 175  < > = values in this interval not displayed. Cardiac Enzymes:  Recent Labs  06/10/16 2241 06/11/16 0448  07/06/16 1315  TROPONINI 0.03 <0.03 0.04*   BNP: Invalid input(s): POCBNP CBG:  Recent Labs  06/10/16 2348  GLUCAP 113*    Radiological Exams: Dg Chest 2 View  Result Date: 12/01/2016 CLINICAL DATA:  [place] on RIGHT hip yesterday, history of RIGHT hip replacement EXAM: CHEST  2 VIEW COMPARISON:  07/09/2016 FINDINGS: Enlargement of cardiac silhouette with pulmonary vascular congestion. Atherosclerotic calcification aorta. Hazy RIGHT upper lobe infiltrate adjacent to minor fissure. Underlying emphysematous changes. Suspected infiltrate at LEFT base as well. Remaining lungs clear. No pleural effusion or pneumothorax. Diffuse osseous demineralization. RIGHT glenohumeral degenerative changes with prior LEFT shoulder joint replacement. IMPRESSION: Emphysematous changes with suspected RIGHT upper lobe and LEFT basilar infiltrates. Aortic atherosclerosis. Enlargement of cardiac silhouette with pulmonary vascular congestion. Electronically Signed   By: Lavonia Dana M.D.   On: 12/01/2016 12:43   Dg Cervical Spine Complete  Result Date: 11/18/2016 CLINICAL DATA:  Cervical pain from fall. EXAM:  CERVICAL SPINE - COMPLETE 4+ VIEW COMPARISON:  None. FINDINGS: The pre odontoid space and prevertebral soft tissues are normal. Mild reversal of the normal lordosis centered at C4-5. No fractures are seen. The neural foramina are patent. There is mild narrowing of the left C5-6 neural foramen. Probable carotid calcifications in the neck bilaterally. The lateral masses of C1 align with C2. The odontoid process is normal. The lung apices are unremarkable. IMPRESSION: Degenerative changes. Probable calcified atherosclerotic change in the carotid arteries. Electronically Signed   By: Dorise Bullion III M.D   On: 11/18/2016 15:56   Ct Femur Right Wo Contrast  Result Date: 12/01/2016 CLINICAL DATA:  Fall today. Abnormal hip radiograph indicating fracture. EXAM: CT OF THE LOWER RIGHT EXTREMITY WITHOUT CONTRAST  TECHNIQUE: Multidetector CT imaging of the right lower extremity was performed according to the standard protocol. COMPARISON:  12/01/2016 FINDINGS: Oblique fracture through the base of the greater trochanter, with 2.8 cm proximal retraction. The resulting fragment measures 4.6 cm craniocaudad by 2.8 cm transverse by 4.4 cm anterior-posterior and has some minimal comminution. Approximately 14 cm of the stem is still imbedded in the shaft. I do not see a lesser trochanter fracture. No regional pelvic fracture or other fracture is identified. Osteoarthritis of the knee on the right side. IMPRESSION: 1. Fracture the greater trochanter of the right hip with about 2.7 cm proximal retraction of the proximal fragment, which is mildly comminuted. This strips some of the bone away from the proximal portion of the stem although 14 cm of the stem is still imbedded in the shaft of the femur. I do not see definite lesser trochanteric involvement or definite involvement of the medial bony margin of the proximal stem. 2. Right knee osteoarthritis. Electronically Signed   By: Van Clines M.D.   On: 12/01/2016 16:16   Dg Hip Unilat W Or Wo Pelvis 2-3 Views Right  Result Date: 12/01/2016 CLINICAL DATA:  Fall EXAM: DG HIP (WITH OR WITHOUT PELVIS) 2-3V RIGHT COMPARISON:  None FINDINGS: Diffuse osseous demineralization. Components of RIGHT hip prosthesis identified. Acute fracture seen at the proximal RIGHT femur and extending from the greater trochanter into the intertrochanteric region. Proximal femoral diaphysis appears intact. No dislocation of the RIGHT hip prosthesis. Minimal degenerative changes of LEFT hip joint and SI joints. Pelvis intact. IMPRESSION: RIGHT hip prosthesis with acute mildly displaced fracture involving the greater trochanter extending into the intertrochanteric region and involving the bones surrounding the proximal portion of the femoral component of the RIGHT hip prosthesis. Electronically Signed    By: Lavonia Dana M.D.   On: 12/01/2016 12:45    Assessment/Plan  Generalized weakness From deconditioning. Will have him work with physical therapy and occupational therapy team to help with gait training and muscle strengthening exercises.fall precautions. Skin care. Encourage to be out of bed. Get palliative consult.   HCAP Stable breathing, continue oxygen and bronchodilators. Continue and complete doxycycline on 12/09/16. Monitor her breathing  COPD exacerbation Breathing currently stable. Continue her bronchodilators and oxygen by nasal canula. Get palliative care consult for further care. Continue and complete tapering course of prednisone.   Right hip fracture Conservative management, orthopedic follow up, continue tramadol 50 mg q6h prn pain and get PMR consult. Will have patient work with PT/OT as tolerated to regain strength and restore function.  Fall precautions are in place. RLE WBAT.  Chronic respiratory failure Continue her chronic oxygen and bronchodilator treatment. Palliative care consult.  Leukocytosis Currently being treated for HCAP. Afebrile. On prednisone.  Monitor wbc and temp curve.   Impaired renal function Monitor bmp  Anemia of chronic disease Monitor cbc  Chronic Constipation Continue senokot bid and miralax daily.   afib Rate controlled. Continue eliquis for stroke prophylaxis. Continue diltiazem 120 mg daily for rate control.   Chronic diastolic CHF Continue diltiazem and lasix, monitor bmp  Chronic depression Stable mood, continue zoloft 75 mg daily  Anxiety  Stable, continue lorazepam qhs prn and monitor  recurrent UTI To start trimethoprim 100 mg qod for prophylaxis. Hydration to be maintained.   Protein calorie malnutrition With her severe COPD. Monitor weight and po intake. Get RD consult. Continue ensure.   HLD Continue vytorin  Hypothyroidism Continue levothyroxine    Goals of care: short term  rehabilitation   Labs/tests ordered: cbc, bmp 12/09/16  Family/ staff Communication: reviewed care plan with patient and nursing supervisor    Blanchie Serve, MD Internal Medicine [place], [place] [address] Cell Phone (Monday-Friday 8 am - 5 pm): [phone] On Call: [phone] and follow prompts after 5 pm and on weekends Office Phone: [phone] Office Fax: [phone]

## 2016-12-06 LAB — CULTURE, BLOOD (ROUTINE X 2)
CULTURE: NO GROWTH
CULTURE: NO GROWTH

## 2016-12-06 NOTE — Telephone Encounter (Signed)
Will sign off of message and send as an FYI to BQ.

## 2016-12-12 ENCOUNTER — Ambulatory Visit (INDEPENDENT_AMBULATORY_CARE_PROVIDER_SITE_OTHER): Payer: Medicare Other | Admitting: Orthopaedic Surgery

## 2016-12-12 ENCOUNTER — Encounter (INDEPENDENT_AMBULATORY_CARE_PROVIDER_SITE_OTHER): Payer: Self-pay | Admitting: Orthopaedic Surgery

## 2016-12-12 ENCOUNTER — Ambulatory Visit (INDEPENDENT_AMBULATORY_CARE_PROVIDER_SITE_OTHER): Payer: No Typology Code available for payment source

## 2016-12-12 VITALS — BP 84/58 | HR 109 | Resp 18 | Ht 67.0 in | Wt 120.0 lb

## 2016-12-12 DIAGNOSIS — S72001D Fracture of unspecified part of neck of right femur, subsequent encounter for closed fracture with routine healing: Secondary | ICD-10-CM

## 2016-12-12 NOTE — Progress Notes (Signed)
Office Visit Note   Patient: Kristine Simmons           Date of Birth: [birth year]           MRN: [id number] Visit Date: 12/12/2016              Requested by: Binnie Rail, MD [place],  [id number] PCP: Binnie Rail, MD   Assessment & Plan: Visit Diagnoses: Displaced fracture of right greater trochanter around hip replacement. There is increased space between the greater trochanter and the proximal femur. Given all of her medical comorbidities I do not think she is a surgical candidate. I believe this will heal with some weakness of her hip  Plan: Continued physical therapy with weightbearing as tolerated right lower extremity using a walker. Follow up 1 month. Follow-Up Instructions: No Follow-up on file.   Orders:  No orders of the defined types were placed in this encounter.  No orders of the defined types were placed in this encounter.     Procedures: No procedures performed   Clinical Data: No additional findings.   Subjective: Chief Complaint  Patient presents with  . Right Hip - Fracture    Fell x 3 weeks, fx, x-rays at [facility], THR x 10 years, no pain, PT, doing good  Kristine Simmons was seen as a consult last week at [facility]. She had fallen at home sustaining an injury to her right hip. Films demonstrated a fracture of the greater trochanter with some slight displacement. She is status post right total hip replacement years ago without a problem. There was no evidence on film of an issue with a hip replacement. Kristine Simmons has been discharged To [facility] for [facility]. Prior to her hospitalization she had been followed by hospice with chronic pulmonary issues and oxygen dependency. She presently is not having "much pain". Review of Systems   Objective: Vital Signs: BP (!) 84/58 (BP Location: Left Arm, Patient Position: Sitting, Cuff Size: Normal)   Pulse (!) 109   Resp 18   Ht 5\' 7"  (1.702 m)   Wt 120 lb (54.4 kg)    BMI 18.79 kg/m   Physical Exam  Ortho Exam Kristine Simmons was evaluated in a wheelchair with her husband and a Biochemist, clinical member from [employer]. She is using nasal oxygen. She was able to actively flex the right hip without difficulty. There was some local tenderness over the greater trochanter. Motor exam appeared to be intact in the right foot  Specialty Comments:  No specialty comments available.  Imaging: No results found.   PMFS History: Patient Active Problem List   Diagnosis Date Noted  . Closed fracture of right hip ([place])   . Fracture of greater trochanter of right femur ([place]) 12/03/2016  . Chronic atrial fibrillation ([place]) 12/02/2016  . DNR (do not resuscitate) 12/02/2016  . Hospice care patient 12/02/2016  . HCAP (healthcare-associated pneumonia) 12/01/2016  . Hip fracture ([place]) 12/01/2016  . Chronic obstructive pulmonary disease ([place])   . Pleural effusion   . Hypoxia 07/07/2016  . UTI (urinary tract infection) with e coli 06/13/2016  . Pneumonia 06/10/2016  . Moderate to severe pulmonary hypertension 04/04/2016  . Moderate tricuspid regurgitation 04/04/2016  . Spinal stenosis of lumbar region 01/10/2016  . Cardiorenal syndrome 11/05/2015  . Palliative care encounter   . Goals of care, counseling/discussion   . Acute diastolic heart failure ([place]) 11/03/2015  . Acute on chronic respiratory failure with hypoxia (  [place]) 11/02/2015  . T12 compression fracture ([place]) 11/02/2015  . Acute respiratory failure with hypoxia ([place]) 11/02/2015  . Aspiration pneumonia ([place]) 04/09/2015  . CAP (community acquired pneumonia) 04/08/2015  . Dysphagia, pharyngoesophageal phase 03/07/2015  . Recurrent inguinal hernia with obstruction 11/11/2014  . Depression 09/22/2014  . Hypoxemia 09/22/2014  . Aortic stenosis, mild 08/15/2014  . Chronic anticoagulation 08/15/2014  . Recurrent femoral hernia with obstruction without gangrene 08/15/2014  . History of CVA (cerebrovascular accident)  08/15/2014  . Squamous cell carcinoma of neck ([place]) 03/09/2014  . Cough 09/16/2012  . Diverticulosis 09/11/2011  . Glaucoma 09/11/2011  . Carotid arterial disease ([place]) 08/08/2009  . HEARING LOSS 11/22/2008  . COPD, Gold grade A 04/08/2008  . HERNIA 04/08/2008  . ARTHRITIS 04/08/2008  . VITAMIN D DEFICIENCY 02/01/2008  . Atrial fibrillation ([place]) 02/01/2008  . Essential hypertension 12/01/2007  . OSTEOPOROSIS 12/01/2007  . Hypothyroidism 08/06/2007  . DISEQUILIBRIUM 07/24/2007  . G E R D 06/02/2007  . THYROID NODULE, LEFT 04/10/2007  . Hyperlipidemia 04/10/2007  . COLONIC POLYPS, HX OF 04/10/2007   Past Medical History:  Diagnosis Date  . Acute diastolic heart failure ([place]) 11/03/2015  . Aortic stenosis, mild 08/15/2014   Echo June, 2015, mild aortic stenosis   . Atrial fibrillation ([place])    Dr Percival Spanish  . 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 Cardiorenal syndrome 11/05/2015  . CAROTID STENOSIS 08/08/2009   Qualifier: Diagnosis of  By: Percival Spanish, MD, Farrel Gordon    . Cerebrovascular accident ([place]) 01/09  . Chronic anticoagulation 08/15/2014   Eliquis for A fib   . Complication of anesthesia    difficulty with arousal post op  . COPD, Gold grade A 04/08/2008   January 2016 pulmonary function testing> ratio 58% FEV1 1.83L (88% pred, no change with BD), TLC 5.78L (103% pred), DLCO 9.01 (31% pred) 07/2015 ONO RA > spO2 79% average 07/2015 ONO 2LPM> < 88% 189 min 08/2015 ONO 3LPM < 88% 69.8 min 09/2015 ONO 4LPM > OK   . Cystitis   . Depression 09/22/2014  . Fracture 1995, 2009   RUE; LUE Dr. Durward Fortes  . G E R D 06/02/2007   Qualifier: Diagnosis of  By: Linna Darner MD, Gwyndolyn Saxon    . Hx of colonic polyps    last colonoscopy 2005, repeat was due 2010 Dr. Sharlett Iles  . Hyperlipidemia   . HYPERTENSION, ESSENTIAL NOS 12/01/2007   Qualifier: Diagnosis of  By: Linna Darner MD, Gwyndolyn Saxon    . Hypothyroidism   . Osteoporosis   . Spinal stenosis of lumbar region 01/10/2016  . Squamous  cell carcinoma of neck 03/09/2014   3/15 Dr Wilhemina Bonito   . T12 compression fracture ([place]) 11/02/2015    Family History  Problem Relation Age of Onset  . Stroke Father     > 91  . Diabetes Father   . Leukemia Mother   . Cancer Mother     leukemia  . Colon cancer Brother     Valve replacement  . Heart attack Brother   . Breast cancer Sister   . Ovarian cancer Sister   . Lung cancer Sister     smoker  . Prostate cancer Brother   . Cancer Brother     bladder    Past Surgical History:  Procedure Laterality Date  . APPENDECTOMY    . CATARACT EXTRACTION     [place]  . COLONOSCOPY W/ POLYPECTOMY  2005   Diverticulosis;Dr. Sharlett Iles  . EXCISION  METACARPAL MASS Right 08/17/2013   Procedure: RIGHT LONG EXCISION MASS AND DIP JOINT DEBRIDEMENT;  Surgeon: Tennis Must, MD;  Location: [facility];  Service: Orthopedics;  Laterality: Right;  . FEMORAL HERNIA REPAIR Right 08/17/2014   Procedure: OPEN REPAIR RIGHT FEMORAL HERNIA  WITH INSERTION OF MESH;  Surgeon: Adin Hector, MD;  Location: [facility];  Service: General;  Laterality: Right;  . INGUINAL HERNIA REPAIR Right 11/15/2014   Procedure: LAPAROSCOPIC RIGHT INGUINAL HERNIA WITH MESH;  Surgeon: Alphonsa Overall, MD;  Location: [facility];  Service: General;  Laterality: Right;  . INSERTION OF MESH  08/17/2014   Procedure: INSERTION OF MESH;  Surgeon: Adin Hector, MD;  Location: [facility];  Service: General;;  . MOHS SURGERY  2013   nasal Basal Cell; Dr Sarajane Jews  . Thyroid Needle Biopsy  2002  . TONSILLECTOMY AND ADENOIDECTOMY    . TOTAL HIP ARTHROPLASTY  2008   CHF & RAF post op   . TOTAL SHOULDER REPLACEMENT  2010   Dr Durward Fortes - left shoulder  . Vocal Cord Polyps     Social History   Occupational History  . Not on file.   Social History Main Topics  . Smoking status: Former Smoker    Packs/day: 1.00    Years: 45.00    Types: Cigarettes    Quit date: 12/23/1993  . Smokeless tobacco: Never Used     Comment: Smoked  1950-1995, up to 1 ppd  . Alcohol use No  . Drug use: No  . Sexual activity: No

## 2016-12-13 ENCOUNTER — Other Ambulatory Visit: Payer: Self-pay

## 2016-12-13 MED ORDER — TRAMADOL HCL 50 MG PO TABS: 50.0000 mg | ORAL_TABLET | Freq: Four times a day (QID) | ORAL | 0 refills | Status: AC | PRN

## 2016-12-13 NOTE — Telephone Encounter (Signed)
Prescription request was received from:  [facility] [address] [place] [address]  Phone: [phone]  Fax: [phone]  

## 2016-12-27 ENCOUNTER — Telehealth: Payer: Self-pay | Admitting: Pulmonary Disease

## 2016-12-30 NOTE — Telephone Encounter (Signed)
Called and spoke to [place] with Hospice. Dewaine Oats states the pt passed away on 01/15/17.   Will forward to BQ as FYI.

## 2017-01-02 NOTE — Telephone Encounter (Signed)
Noted, thanks!

## 2017-01-09 ENCOUNTER — Ambulatory Visit: Payer: Medicare Other | Admitting: Pulmonary Disease

## 2017-01-13 ENCOUNTER — Ambulatory Visit (INDEPENDENT_AMBULATORY_CARE_PROVIDER_SITE_OTHER): Payer: Medicare Other | Admitting: Orthopaedic Surgery

## 2017-01-13 ENCOUNTER — Telehealth (INDEPENDENT_AMBULATORY_CARE_PROVIDER_SITE_OTHER): Payer: Self-pay | Admitting: Orthopaedic Surgery

## 2017-01-13 NOTE — Telephone Encounter (Signed)
Patient's daughter called the office to let Dr. Durward Fortes know that patient passed away.

## 2017-01-23 NOTE — Telephone Encounter (Signed)
Attempted to contact pt. No answer, no option to leave a message. Will try back.  

## 2017-01-23 DEATH — deceased

## 2017-04-01 ENCOUNTER — Ambulatory Visit: Payer: Medicare Other | Admitting: Internal Medicine
